# Patient Record
Sex: Male | Born: 1960 | Race: White | Hispanic: No | Smoking: Current every day smoker
Health system: Southern US, Community
[De-identification: ages and names within clinical notes are randomized; demographics above are authoritative.]

## PROBLEM LIST (undated history)

## (undated) DIAGNOSIS — Z87442 Personal history of urinary calculi: Secondary | ICD-10-CM

## (undated) DIAGNOSIS — K760 Fatty (change of) liver, not elsewhere classified: Secondary | ICD-10-CM

## (undated) DIAGNOSIS — M1990 Unspecified osteoarthritis, unspecified site: Secondary | ICD-10-CM

## (undated) DIAGNOSIS — S52502A Unspecified fracture of the lower end of left radius, initial encounter for closed fracture: Secondary | ICD-10-CM

## (undated) DIAGNOSIS — I10 Essential (primary) hypertension: Secondary | ICD-10-CM

## (undated) DIAGNOSIS — T7840XA Allergy, unspecified, initial encounter: Secondary | ICD-10-CM

## (undated) DIAGNOSIS — F1910 Other psychoactive substance abuse, uncomplicated: Secondary | ICD-10-CM

## (undated) HISTORY — DX: Allergy, unspecified, initial encounter: T78.40XA

## (undated) HISTORY — DX: Essential (primary) hypertension: I10

## (undated) HISTORY — PX: TONSILLECTOMY: SUR1361

## (undated) HISTORY — PX: NO PAST SURGERIES: SHX2092

---

## 2006-12-18 ENCOUNTER — Emergency Department (HOSPITAL_COMMUNITY): Admission: EM | Admit: 2006-12-18 | Discharge: 2006-12-18 | Payer: Self-pay | Admitting: Family Medicine

## 2008-02-23 ENCOUNTER — Emergency Department (HOSPITAL_COMMUNITY): Admission: EM | Admit: 2008-02-23 | Discharge: 2008-02-23 | Payer: Self-pay | Admitting: Emergency Medicine

## 2010-06-07 ENCOUNTER — Emergency Department (HOSPITAL_COMMUNITY)
Admission: EM | Admit: 2010-06-07 | Discharge: 2010-06-08 | Disposition: A | Discharge status: Other Acute Inpatient Hospital | Payer: Self-pay | Source: Home / Self Care | Admitting: Emergency Medicine

## 2010-06-08 ENCOUNTER — Inpatient Hospital Stay (HOSPITAL_COMMUNITY)
Admission: RE | Admit: 2010-06-08 | Discharge: 2010-06-11 | Payer: Self-pay | Source: Home / Self Care | Attending: Psychiatry | Admitting: Psychiatry

## 2010-06-18 ENCOUNTER — Ambulatory Visit (HOSPITAL_COMMUNITY): Admit: 2010-06-18 | Payer: Self-pay | Admitting: Psychiatry

## 2010-08-23 LAB — RAPID URINE DRUG SCREEN, HOSP PERFORMED
Barbiturates: NOT DETECTED
Benzodiazepines: NOT DETECTED
Cocaine: NOT DETECTED

## 2010-08-23 LAB — DIFFERENTIAL
Basophils Absolute: 0 10*3/uL (ref 0.0–0.1)
Basophils Relative: 0 % (ref 0–1)
Eosinophils Relative: 3 % (ref 0–5)
Monocytes Absolute: 0.5 10*3/uL (ref 0.1–1.0)

## 2010-08-23 LAB — BASIC METABOLIC PANEL
BUN: 7 mg/dL (ref 6–23)
CO2: 23 mEq/L (ref 19–32)
Chloride: 107 mEq/L (ref 96–112)
Glucose, Bld: 149 mg/dL — ABNORMAL HIGH (ref 70–99)
Potassium: 3.5 mEq/L (ref 3.5–5.1)

## 2010-08-23 LAB — CBC
HCT: 43.9 % (ref 39.0–52.0)
MCH: 34.8 pg — ABNORMAL HIGH (ref 26.0–34.0)
MCHC: 34.9 g/dL (ref 30.0–36.0)
MCV: 99.8 fL (ref 78.0–100.0)
RDW: 12.1 % (ref 11.5–15.5)

## 2010-08-23 LAB — HEPATIC FUNCTION PANEL
Alkaline Phosphatase: 43 U/L (ref 39–117)
Bilirubin, Direct: 0.2 mg/dL (ref 0.0–0.3)
Total Bilirubin: 0.7 mg/dL (ref 0.3–1.2)

## 2010-08-23 LAB — TRICYCLICS SCREEN, URINE: TCA Scrn: NOT DETECTED

## 2011-06-16 ENCOUNTER — Encounter: Payer: Self-pay | Admitting: Physician Assistant

## 2011-06-16 ENCOUNTER — Ambulatory Visit (INDEPENDENT_AMBULATORY_CARE_PROVIDER_SITE_OTHER): Payer: PRIVATE HEALTH INSURANCE | Admitting: Physician Assistant

## 2011-06-16 DIAGNOSIS — I10 Essential (primary) hypertension: Secondary | ICD-10-CM | POA: Insufficient documentation

## 2011-06-16 DIAGNOSIS — M79609 Pain in unspecified limb: Secondary | ICD-10-CM

## 2011-06-16 DIAGNOSIS — R350 Frequency of micturition: Secondary | ICD-10-CM

## 2011-12-22 ENCOUNTER — Encounter: Payer: PRIVATE HEALTH INSURANCE | Admitting: Physician Assistant

## 2012-02-07 ENCOUNTER — Other Ambulatory Visit: Payer: Self-pay | Admitting: Physician Assistant

## 2015-01-22 ENCOUNTER — Ambulatory Visit (INDEPENDENT_AMBULATORY_CARE_PROVIDER_SITE_OTHER): Payer: PRIVATE HEALTH INSURANCE

## 2015-01-22 ENCOUNTER — Ambulatory Visit (INDEPENDENT_AMBULATORY_CARE_PROVIDER_SITE_OTHER): Payer: PRIVATE HEALTH INSURANCE | Admitting: Family Medicine

## 2015-01-22 VITALS — BP 182/108 | HR 66 | Temp 98.3°F | Resp 16 | Ht 71.0 in | Wt 171.6 lb

## 2015-01-22 DIAGNOSIS — I10 Essential (primary) hypertension: Secondary | ICD-10-CM | POA: Diagnosis not present

## 2015-01-22 DIAGNOSIS — S5292XA Unspecified fracture of left forearm, initial encounter for closed fracture: Secondary | ICD-10-CM

## 2015-01-22 DIAGNOSIS — W19XXXA Unspecified fall, initial encounter: Secondary | ICD-10-CM

## 2015-01-22 DIAGNOSIS — M25532 Pain in left wrist: Secondary | ICD-10-CM

## 2015-01-22 DIAGNOSIS — S5290XA Unspecified fracture of unspecified forearm, initial encounter for closed fracture: Secondary | ICD-10-CM | POA: Insufficient documentation

## 2015-01-22 LAB — COMPREHENSIVE METABOLIC PANEL
ALBUMIN: 4.5 g/dL (ref 3.6–5.1)
ALT: 50 U/L — ABNORMAL HIGH (ref 9–46)
AST: 49 U/L — ABNORMAL HIGH (ref 10–35)
Alkaline Phosphatase: 58 U/L (ref 40–115)
BUN: 16 mg/dL (ref 7–25)
CALCIUM: 9.2 mg/dL (ref 8.6–10.3)
CHLORIDE: 102 mmol/L (ref 98–110)
CO2: 26 mmol/L (ref 20–31)
Creat: 0.85 mg/dL (ref 0.70–1.33)
Glucose, Bld: 87 mg/dL (ref 65–99)
POTASSIUM: 4.2 mmol/L (ref 3.5–5.3)
SODIUM: 138 mmol/L (ref 135–146)
Total Bilirubin: 1.1 mg/dL (ref 0.2–1.2)
Total Protein: 7.9 g/dL (ref 6.1–8.1)

## 2015-01-22 MED ORDER — OXYCODONE-ACETAMINOPHEN 5-325 MG PO TABS: 1.0000 | ORAL_TABLET | Freq: Three times a day (TID) | ORAL | Status: DC | PRN

## 2015-01-22 MED ORDER — LISINOPRIL-HYDROCHLOROTHIAZIDE 10-12.5 MG PO TABS: 1.0000 | ORAL_TABLET | Freq: Every day | ORAL | Status: DC

## 2015-01-22 NOTE — Progress Notes (Signed)
    MRN: [id number] DOB: Jan 04, 1961  Subjective:   Richard Andrews is a 54 y.o. male presenting for chief complaint of Wrist Injury and Hypertension  Reports 4 day history of left forearm and wrist pain. Patient was playing football with his son on Sunday, fell backwards and broke his fall with an outstretched left hand onto pavement. He has since had significant pain of his forearm and wrist, swelling, bruising, decreased range of motion secondary to pain. Has tried ibuprofen with minimal relief. Denies fever, numbness, tingling, finger pain, elbow pain, bony deformity. Patient smokes half-pack per day, has a 10 year smoking history. Patient also drinks more than 15 beers a week, admits that he is a heavy drinker. He has a history of hypertension that was previously managed with lisinopril. Patient denies chest pain, shortness of breath, heart racing, palpitations, jaw pain, neck pain, back pain, abdominal pain, nausea, vomiting, diaphoresis. Patient stopped taking blood pressure medication and stopped following up. Denies any other aggravating or relieving factors, no other questions or concerns.  Richard Andrews currently has no medications in their medication list. Also has No Known Allergies.  Richard Andrews  has a past medical history of Hypertension and Allergy. Also  has no past surgical history on file.  Objective:   Vitals: BP 182/108 mmHg  Pulse 66  Temp(Src) 98.3 F (36.8 C) (Oral)  Resp 16  Ht  (1.803 m)  Wt 171 lb 9.6 oz (77.837 kg)  BMI 23.94 kg/m2  SpO2 99%  Physical Exam  Constitutional: He is oriented to person, place, and time. He appears well-developed and well-nourished.  HENT:  Mouth/Throat: Oropharynx is clear and moist.  Eyes: Pupils are equal, round, and reactive to light. No scleral icterus.  Cardiovascular: Normal rate, regular rhythm and intact distal pulses.  Exam reveals no gallop and no friction rub.   No murmur heard. Pulmonary/Chest: No respiratory distress. He has  no wheezes. He has no rales.  Abdominal: Soft. Bowel sounds are normal. He exhibits no distension and no mass. There is no tenderness.  Musculoskeletal:       Left wrist: He exhibits decreased range of motion, tenderness, bony tenderness and swelling (over area depicted with associated ecchymosis). He exhibits no effusion, no crepitus and no deformity.       Arms: No pain over olecranon process, phalanges or metacarpals.   Neurological: He is alert and oriented to person, place, and time.  Skin: Skin is warm and dry. No rash noted. No erythema. No pallor.   ECG interpretation by Dr. Clelia Croft and PA-Giavanni Odonovan - normal sinus rhythm.  [facility] reading (PRIMARY) by  Dr. Clelia Croft and PA-Javionna Leder. Left forearm - impacted fracture of distal radius Left wrist - possible scaphoid fracture over radial aspect, please comment  Assessment and Plan :   1. Forearm fracture, left, closed, initial encounter 2. Fall, initial encounter 3. Pain in joint, forearm, left - Stabilized with sugar tong splint of left forearm. Urgent referral to ortho, Percocet for pain.  4. Essential hypertension - Asymptomatic, will restart BP medication lis-HCT, start baby aspirin daily. Cmet pending. Recheck in 4 weeks.  Wallis Bamberg, PA-C [facility] [phone] 01/22/2015 9:26 AM

## 2015-01-22 NOTE — Patient Instructions (Signed)
Forearm Fracture Your caregiver has diagnosed you as having a broken bone (fracture) of the forearm. This is the part of your arm between the elbow and your wrist. Your forearm is made up of two bones. These are the radius and ulna. A fracture is a break in one or both bones. A cast or splint is used to protect and keep your injured bone from moving. The cast or splint will be on generally for about 5 to 6 weeks, with individual variations. HOME CARE INSTRUCTIONS   Keep the injured part elevated while sitting or lying down. Keeping the injury above the level of your heart (the center of the chest). This will decrease swelling and pain.  Apply ice to the injury for 15-20 minutes, 03-04 times per day while awake, for 2 days. Put the ice in a plastic bag and place a thin towel between the bag of ice and your cast or splint.  If you have a plaster or fiberglass cast:  Do not try to scratch the skin under the cast using sharp or pointed objects.  Check the skin around the cast every day. You may put lotion on any red or sore areas.  Keep your cast dry and clean.  If you have a plaster splint:  Wear the splint as directed.  You may loosen the elastic around the splint if your fingers become numb, tingle, or turn cold or blue.  Do not put pressure on any part of your cast or splint. It may break. Rest your cast only on a pillow the first 24 hours until it is fully hardened.  Your cast or splint can be protected during bathing with a plastic bag. Do not lower the cast or splint into water.  Only take over-the-counter or prescription medicines for pain, discomfort, or fever as directed by your caregiver. SEEK IMMEDIATE MEDICAL CARE IF:   Your cast gets damaged or breaks.  You have more severe pain or swelling than you did before the cast.  Your skin or nails below the injury turn blue or gray, or feel cold or numb.  There is a bad smell or new stains and/or pus like (purulent) drainage  coming from under the cast. MAKE SURE YOU:   Understand these instructions.  Will watch your condition.  Will get help right away if you are not doing well or get worse. Document Released: 05/27/2000 Document Revised: 08/22/2011 Document Reviewed: 01/17/2008 [facility] Patient Information 2015 [place], [place]. This information is not intended to replace advice given to you by your health care provider. Make sure you discuss any questions you have with your health care provider.   Start taking 1 baby aspirin daily.  Hypertension Hypertension, commonly called high blood pressure, is when the force of blood pumping through your arteries is too strong. Your arteries are the blood vessels that carry blood from your heart throughout your body. A blood pressure reading consists of a higher number over a lower number, such as 110/72. The higher number (systolic) is the pressure inside your arteries when your heart pumps. The lower number (diastolic) is the pressure inside your arteries when your heart relaxes. Ideally you want your blood pressure below 120/80. Hypertension forces your heart to work harder to pump blood. Your arteries may become narrow or stiff. Having hypertension puts you at risk for heart disease, stroke, and other problems.  RISK FACTORS Some risk factors for high blood pressure are controllable. Others are not.  Risk factors you cannot control include:  Race. You may be at higher risk if you are African American.  Age. Risk increases with age.  Gender. Men are at higher risk than women before age 79 years. After age 60, women are at higher risk than men. Risk factors you can control include:  Not getting enough exercise or physical activity.  Being overweight.  Getting too much fat, sugar, calories, or salt in your diet.  Drinking too much alcohol. SIGNS AND SYMPTOMS Hypertension does not usually cause signs or symptoms. Extremely high blood pressure (hypertensive  crisis) may cause headache, anxiety, shortness of breath, and nosebleed. DIAGNOSIS  To check if you have hypertension, your health care provider will measure your blood pressure while you are seated, with your arm held at the level of your heart. It should be measured at least twice using the same arm. Certain conditions can cause a difference in blood pressure between your right and left arms. A blood pressure reading that is higher than normal on one occasion does not mean that you need treatment. If one blood pressure reading is high, ask your health care provider about having it checked again. TREATMENT  Treating high blood pressure includes making lifestyle changes and possibly taking medicine. Living a healthy lifestyle can help lower high blood pressure. You may need to change some of your habits. Lifestyle changes may include:  Following the DASH diet. This diet is high in fruits, vegetables, and whole grains. It is low in salt, red meat, and added sugars.  Getting at least 2 hours of brisk physical activity every week.  Losing weight if necessary.  Not smoking.  Limiting alcoholic beverages.  Learning ways to reduce stress. If lifestyle changes are not enough to get your blood pressure under control, your health care provider may prescribe medicine. You may need to take more than one. Work closely with your health care provider to understand the risks and benefits. HOME CARE INSTRUCTIONS  Have your blood pressure rechecked as directed by your health care provider.   Take medicines only as directed by your health care provider. Follow the directions carefully. Blood pressure medicines must be taken as prescribed. The medicine does not work as well when you skip doses. Skipping doses also puts you at risk for problems.   Do not smoke.   Monitor your blood pressure at home as directed by your health care provider. SEEK MEDICAL CARE IF:   You think you are having a reaction  to medicines taken.  You have recurrent headaches or feel dizzy.  You have swelling in your ankles.  You have trouble with your vision. SEEK IMMEDIATE MEDICAL CARE IF:  You develop a severe headache or confusion.  You have unusual weakness, numbness, or feel faint.  You have severe chest or abdominal pain.  You vomit repeatedly.  You have trouble breathing. MAKE SURE YOU:   Understand these instructions.  Will watch your condition.  Will get help right away if you are not doing well or get worse. Document Released: 05/30/2005 Document Revised: 10/14/2013 Document Reviewed: 03/22/2013 [facility] Patient Information 2015 [place], [place]. This information is not intended to replace advice given to you by your health care provider. Make sure you discuss any questions you have with your health care provider.

## 2015-01-26 ENCOUNTER — Other Ambulatory Visit (HOSPITAL_COMMUNITY): Payer: Self-pay | Admitting: Orthopaedic Surgery

## 2015-01-26 ENCOUNTER — Encounter (HOSPITAL_BASED_OUTPATIENT_CLINIC_OR_DEPARTMENT_OTHER): Payer: Self-pay | Admitting: *Deleted

## 2015-01-28 ENCOUNTER — Encounter (HOSPITAL_BASED_OUTPATIENT_CLINIC_OR_DEPARTMENT_OTHER): Payer: Self-pay | Admitting: *Deleted

## 2015-01-28 ENCOUNTER — Encounter (HOSPITAL_BASED_OUTPATIENT_CLINIC_OR_DEPARTMENT_OTHER)
Admission: RE | Disposition: A | Discharge status: Home / Self Care | Payer: Self-pay | Source: Ambulatory Visit | Attending: Orthopaedic Surgery

## 2015-01-28 ENCOUNTER — Ambulatory Visit (HOSPITAL_COMMUNITY): Payer: PRIVATE HEALTH INSURANCE

## 2015-01-28 ENCOUNTER — Ambulatory Visit (HOSPITAL_BASED_OUTPATIENT_CLINIC_OR_DEPARTMENT_OTHER): Payer: PRIVATE HEALTH INSURANCE | Admitting: Anesthesiology

## 2015-01-28 ENCOUNTER — Ambulatory Visit (HOSPITAL_BASED_OUTPATIENT_CLINIC_OR_DEPARTMENT_OTHER)
Admission: RE | Admit: 2015-01-28 | Discharge: 2015-01-28 | Disposition: A | Discharge status: Home / Self Care | Payer: PRIVATE HEALTH INSURANCE | Source: Ambulatory Visit | Attending: Orthopaedic Surgery | Admitting: Orthopaedic Surgery

## 2015-01-28 DIAGNOSIS — Z79899 Other long term (current) drug therapy: Secondary | ICD-10-CM | POA: Diagnosis not present

## 2015-01-28 DIAGNOSIS — X58XXXA Exposure to other specified factors, initial encounter: Secondary | ICD-10-CM | POA: Insufficient documentation

## 2015-01-28 DIAGNOSIS — S52572A Other intraarticular fracture of lower end of left radius, initial encounter for closed fracture: Secondary | ICD-10-CM | POA: Insufficient documentation

## 2015-01-28 DIAGNOSIS — S52502A Unspecified fracture of the lower end of left radius, initial encounter for closed fracture: Secondary | ICD-10-CM | POA: Diagnosis present

## 2015-01-28 DIAGNOSIS — I10 Essential (primary) hypertension: Secondary | ICD-10-CM | POA: Insufficient documentation

## 2015-01-28 DIAGNOSIS — F17210 Nicotine dependence, cigarettes, uncomplicated: Secondary | ICD-10-CM | POA: Diagnosis not present

## 2015-01-28 DIAGNOSIS — Z419 Encounter for procedure for purposes other than remedying health state, unspecified: Secondary | ICD-10-CM

## 2015-01-28 HISTORY — PX: OPEN REDUCTION INTERNAL FIXATION (ORIF) DISTAL RADIAL FRACTURE: SHX5989

## 2015-01-28 HISTORY — DX: Unspecified fracture of the lower end of left radius, initial encounter for closed fracture: S52.502A

## 2015-01-28 LAB — POCT HEMOGLOBIN-HEMACUE: HEMOGLOBIN: 15 g/dL (ref 13.0–17.0)

## 2015-01-28 SURGERY — OPEN REDUCTION INTERNAL FIXATION (ORIF) DISTAL RADIUS FRACTURE
Anesthesia: Regional | Site: Arm Lower | Laterality: Left

## 2015-01-28 MED ORDER — FENTANYL CITRATE (PF) 100 MCG/2ML IJ SOLN
INTRAMUSCULAR | Status: AC
  Filled 2015-01-28: qty 2

## 2015-01-28 MED ORDER — PROPOFOL 10 MG/ML IV BOLUS
INTRAVENOUS | Status: DC | PRN
  Administered 2015-01-28: 200 mg via INTRAVENOUS
  Administered 2015-01-28: 50 mg via INTRAVENOUS

## 2015-01-28 MED ORDER — BUPIVACAINE-EPINEPHRINE (PF) 0.5% -1:200000 IJ SOLN
INTRAMUSCULAR | Status: DC | PRN
  Administered 2015-01-28: 5 mL
  Administered 2015-01-28: 3 mL
  Administered 2015-01-28: 7 mL
  Administered 2015-01-28 (×3): 5 mL

## 2015-01-28 MED ORDER — MIDAZOLAM HCL 2 MG/2ML IJ SOLN
INTRAMUSCULAR | Status: AC
  Filled 2015-01-28: qty 2

## 2015-01-28 MED ORDER — SCOPOLAMINE 1 MG/3DAYS TD PT72: 1.0000 | MEDICATED_PATCH | Freq: Once | TRANSDERMAL | Status: DC | PRN

## 2015-01-28 MED ORDER — OXYCODONE HCL 5 MG/5ML PO SOLN: 5.0000 mg | Freq: Once | ORAL | Status: DC | PRN

## 2015-01-28 MED ORDER — OXYCODONE HCL 5 MG PO TABS: 5.0000 mg | ORAL_TABLET | Freq: Once | ORAL | Status: DC | PRN

## 2015-01-28 MED ORDER — ONDANSETRON HCL 4 MG/2ML IJ SOLN
INTRAMUSCULAR | Status: DC | PRN
Start: 2015-01-28 — End: 2015-01-28
  Administered 2015-01-28: 4 mg via INTRAVENOUS

## 2015-01-28 MED ORDER — PROMETHAZINE HCL 25 MG/ML IJ SOLN: 6.2500 mg | INTRAMUSCULAR | Status: DC | PRN

## 2015-01-28 MED ORDER — LACTATED RINGERS IV SOLN
INTRAVENOUS | Status: DC
  Administered 2015-01-28 (×2): via INTRAVENOUS

## 2015-01-28 MED ORDER — KETOROLAC TROMETHAMINE 30 MG/ML IJ SOLN: 30.0000 mg | Freq: Once | INTRAMUSCULAR | Status: DC

## 2015-01-28 MED ORDER — OXYCODONE-ACETAMINOPHEN 5-325 MG PO TABS: 1.0000 | ORAL_TABLET | ORAL | Status: DC | PRN

## 2015-01-28 MED ORDER — CEFAZOLIN SODIUM-DEXTROSE 2-3 GM-% IV SOLR
2.0000 g | INTRAVENOUS | Status: AC
  Administered 2015-01-28: 2 g via INTRAVENOUS

## 2015-01-28 MED ORDER — FENTANYL CITRATE (PF) 100 MCG/2ML IJ SOLN
INTRAMUSCULAR | Status: AC
  Filled 2015-01-28: qty 4

## 2015-01-28 MED ORDER — GLYCOPYRROLATE 0.2 MG/ML IJ SOLN
0.2000 mg | Freq: Once | INTRAMUSCULAR | Status: AC | PRN
  Administered 2015-01-28: 0.2 mg via INTRAVENOUS

## 2015-01-28 MED ORDER — MIDAZOLAM HCL 2 MG/2ML IJ SOLN
1.0000 mg | INTRAMUSCULAR | Status: DC | PRN
  Administered 2015-01-28: 2 mg via INTRAVENOUS

## 2015-01-28 MED ORDER — HYDROMORPHONE HCL 1 MG/ML IJ SOLN: 0.2500 mg | INTRAMUSCULAR | Status: DC | PRN

## 2015-01-28 MED ORDER — DEXAMETHASONE SODIUM PHOSPHATE 10 MG/ML IJ SOLN
INTRAMUSCULAR | Status: DC | PRN
  Administered 2015-01-28: 10 mg via INTRAVENOUS

## 2015-01-28 MED ORDER — FENTANYL CITRATE (PF) 100 MCG/2ML IJ SOLN
50.0000 ug | INTRAMUSCULAR | Status: DC | PRN
  Administered 2015-01-28: 100 ug via INTRAVENOUS
  Administered 2015-01-28: 50 ug via INTRAVENOUS

## 2015-01-28 SURGICAL SUPPLY — 95 items
BANDAGE ELASTIC 3 VELCRO ST LF (GAUZE/BANDAGES/DRESSINGS) ×3 IMPLANT
BANDAGE ELASTIC 4 VELCRO ST LF (GAUZE/BANDAGES/DRESSINGS) IMPLANT
BIT DRILL 2.2 SS TIBIAL (BIT) ×2 IMPLANT
BLADE MINI RND TIP GREEN BEAV (BLADE) IMPLANT
BLADE SURG 10 STRL SS (BLADE) ×3 IMPLANT
BLADE SURG 15 STRL LF DISP TIS (BLADE) ×1 IMPLANT
BLADE SURG 15 STRL SS (BLADE) ×3
BNDG CMPR 9X4 STRL LF SNTH (GAUZE/BANDAGES/DRESSINGS) ×1
BNDG ESMARK 4X9 LF (GAUZE/BANDAGES/DRESSINGS) ×3 IMPLANT
BNDG GAUZE ELAST 4 BULKY (GAUZE/BANDAGES/DRESSINGS) ×3 IMPLANT
BRUSH SCRUB EZ PLAIN DRY (MISCELLANEOUS) ×3 IMPLANT
BUR EGG 3PK/BX (BURR) IMPLANT
CLOSURE WOUND 1/4X4 (GAUZE/BANDAGES/DRESSINGS)
CORDS BIPOLAR (ELECTRODE) ×3 IMPLANT
COVER BACK TABLE 60X90IN (DRAPES) ×3 IMPLANT
COVER MAYO STAND STRL (DRAPES) ×3 IMPLANT
CUFF TOURNIQUET SINGLE 18IN (TOURNIQUET CUFF) ×2 IMPLANT
DECANTER SPIKE VIAL GLASS SM (MISCELLANEOUS) IMPLANT
DRAPE C-ARM 42X72 X-RAY (DRAPES) ×3 IMPLANT
DRAPE EXTREMITY T 121X128X90 (DRAPE) ×3 IMPLANT
DRAPE INCISE IOBAN 66X45 STRL (DRAPES) IMPLANT
DRAPE SURG 17X23 STRL (DRAPES) ×3 IMPLANT
DURAPREP 26ML APPLICATOR (WOUND CARE) ×3 IMPLANT
GAUZE SPONGE 4X4 12PLY STRL (GAUZE/BANDAGES/DRESSINGS) ×3 IMPLANT
GAUZE SPONGE 4X4 16PLY XRAY LF (GAUZE/BANDAGES/DRESSINGS) IMPLANT
GAUZE XEROFORM 1X8 LF (GAUZE/BANDAGES/DRESSINGS) ×3 IMPLANT
GLOVE BIOGEL PI IND STRL 7.0 (GLOVE) IMPLANT
GLOVE BIOGEL PI INDICATOR 7.0 (GLOVE) ×4
GLOVE ECLIPSE 6.5 STRL STRAW (GLOVE) ×2 IMPLANT
GLOVE NEODERM STRL 7.5 LF PF (GLOVE) ×1 IMPLANT
GLOVE SURG NEODERM 7.5  LF PF (GLOVE) ×2
GLOVE SURG SYN 7.5  E (GLOVE) ×2
GLOVE SURG SYN 7.5 E (GLOVE) ×1 IMPLANT
GLOVE SURG SYN 7.5 PF PI (GLOVE) ×1 IMPLANT
GOWN PREVENTION PLUS XLARGE (GOWN DISPOSABLE) ×1 IMPLANT
GOWN STRL REIN XL XLG (GOWN DISPOSABLE) ×3 IMPLANT
K-WIRE 1.6 (WIRE) ×9
K-WIRE FX5X1.6XNS BN SS (WIRE) ×3
KWIRE FX5X1.6XNS BN SS (WIRE) IMPLANT
NEEDLE HYPO 22GX1.5 SAFETY (NEEDLE) IMPLANT
NS IRRIG 1000ML POUR BTL (IV SOLUTION) ×7 IMPLANT
PACK BASIN DAY SURGERY FS (CUSTOM PROCEDURE TRAY) ×3 IMPLANT
PAD CAST 3X4 CTTN HI CHSV (CAST SUPPLIES) ×1 IMPLANT
PADDING CAST ABS 3INX4YD NS (CAST SUPPLIES)
PADDING CAST ABS COTTON 3X4 (CAST SUPPLIES) IMPLANT
PADDING CAST COTTON 3X4 STRL (CAST SUPPLIES) ×3
PADDING CAST SYNTHETIC 3 NS LF (CAST SUPPLIES)
PADDING CAST SYNTHETIC 3X4 NS (CAST SUPPLIES) IMPLANT
PADDING CAST SYNTHETIC 4 (CAST SUPPLIES) ×2
PADDING CAST SYNTHETIC 4X4 STR (CAST SUPPLIES) IMPLANT
PLATE STANDARD DVR LEFT (Plate) ×3 IMPLANT
PLATE STD DVR LT 24X51 (Plate) IMPLANT
SCREW LOCK 14X2.7X 3 LD TPR (Screw) IMPLANT
SCREW LOCK 16X2.7X 3 LD TPR (Screw) IMPLANT
SCREW LOCK 20X2.7X 3 LD TPR (Screw) IMPLANT
SCREW LOCK 22X2.7X 3 LD TPR (Screw) IMPLANT
SCREW LOCK 24X2.7X3 LD THRD (Screw) IMPLANT
SCREW LOCKING 2.7X14 (Screw) ×6 IMPLANT
SCREW LOCKING 2.7X16 (Screw) ×6 IMPLANT
SCREW LOCKING 2.7X20MM (Screw) ×6 IMPLANT
SCREW LOCKING 2.7X22MM (Screw) ×6 IMPLANT
SCREW LOCKING 2.7X24MM (Screw) ×3 IMPLANT
SCREW NLOCK 26X2.7X3 LD THRD (Screw) IMPLANT
SCREW NONLOCK 2.7X26MM (Screw) ×3 IMPLANT
SLEEVE SCD COMPRESS KNEE MED (MISCELLANEOUS) ×3 IMPLANT
SLING ARM FOAM STRAP LRG (SOFTGOODS) ×2 IMPLANT
SPLINT FIBERGLASS 3X35 (CAST SUPPLIES) IMPLANT
SPLINT PLASTER CAST XFAST 3X15 (CAST SUPPLIES) IMPLANT
SPLINT PLASTER XTRA FASTSET 3X (CAST SUPPLIES)
SPONGE LAP 4X18 X RAY DECT (DISPOSABLE) IMPLANT
STAPLER VISISTAT (STAPLE) IMPLANT
STOCKINETTE 4X48 STRL (DRAPES) ×3 IMPLANT
STRIP CLOSURE SKIN 1/4X4 (GAUZE/BANDAGES/DRESSINGS) IMPLANT
SUCTION FRAZIER TIP 10 FR DISP (SUCTIONS) ×2 IMPLANT
SUT BONE WAX W31G (SUTURE) ×3 IMPLANT
SUT ETHILON 3 0 PS 1 (SUTURE) ×3 IMPLANT
SUT MERSILENE 4 0 P 3 (SUTURE) IMPLANT
SUT VIC AB 0 CT1 18XCR BRD 8 (SUTURE) IMPLANT
SUT VIC AB 0 CT1 8-18 (SUTURE)
SUT VIC AB 2-0 CT1 27 (SUTURE)
SUT VIC AB 2-0 CT1 TAPERPNT 27 (SUTURE) IMPLANT
SUT VIC AB 2-0 SH 27 (SUTURE)
SUT VIC AB 2-0 SH 27XBRD (SUTURE) IMPLANT
SUT VIC AB 4-0 P-3 18XBRD (SUTURE) IMPLANT
SUT VIC AB 4-0 P2 18 (SUTURE) IMPLANT
SUT VIC AB 4-0 P3 18 (SUTURE)
SYR BULB 3OZ (MISCELLANEOUS) ×3 IMPLANT
SYR CONTROL 10ML LL (SYRINGE) IMPLANT
TOWEL OR 17X24 6PK STRL BLUE (TOWEL DISPOSABLE) ×4 IMPLANT
TOWEL OR NON WOVEN STRL DISP B (DISPOSABLE) ×2 IMPLANT
TRAY DSU PREP LF (CUSTOM PROCEDURE TRAY) ×3 IMPLANT
TUBE CONNECTING 20'X1/4 (TUBING) ×1
TUBE CONNECTING 20X1/4 (TUBING) ×1 IMPLANT
UNDERPAD 30X30 (UNDERPADS AND DIAPERS) ×3 IMPLANT
YANKAUER SUCT BULB TIP NO VENT (SUCTIONS) IMPLANT

## 2015-01-28 NOTE — Progress Notes (Signed)
Assisted Dr. Rob Fitzgerald with left, ultrasound guided, axillary block. Side rails up, monitors on throughout procedure. See vital signs in flow sheet. Tolerated Procedure well. 

## 2015-01-28 NOTE — Anesthesia Postprocedure Evaluation (Signed)
  Anesthesia Post-op Note  Patient: Richard Andrews  Procedure(s) Performed: Procedure(s): OPEN REDUCTION INTERNAL FIXATION (ORIF) LEFT DISTAL RADIAL FRACTURE (Left)  Patient Location: PACU  Anesthesia Type:GA combined with regional for post-op pain  Level of Consciousness: awake and alert   Airway and Oxygen Therapy: Patient Spontanous Breathing  Post-op Pain: none  Post-op Assessment: Post-op Vital signs reviewed              Post-op Vital Signs: Reviewed  Last Vitals:  Filed Vitals:   01/28/15 1315  BP: 149/90  Pulse: 50  Temp: 36.2 C  Resp: 16    Complications: No apparent anesthesia complications

## 2015-01-28 NOTE — Anesthesia Preprocedure Evaluation (Addendum)
Anesthesia Evaluation  Patient identified by MRN, date of birth, ID band Patient awake    Reviewed: Allergy & Precautions, NPO status , Patient's Chart, lab work & pertinent test results  Airway Mallampati: III  TM Distance: >3 FB Neck ROM: Full  Mouth opening: Limited Mouth Opening  Dental  (+) Dental Advisory Given   Pulmonary Current Smoker,  breath sounds clear to auscultation        Cardiovascular hypertension, Pt. on medications Rhythm:Regular Rate:Normal     Neuro/Psych negative neurological ROS     GI/Hepatic negative GI ROS, Neg liver ROS,   Endo/Other  negative endocrine ROS  Renal/GU negative Renal ROS     Musculoskeletal negative musculoskeletal ROS (+)   Abdominal   Peds  Hematology negative hematology ROS (+)   Anesthesia Other Findings   Reproductive/Obstetrics                            Anesthesia Physical Anesthesia Plan  ASA: II  Anesthesia Plan: General and Regional   Post-op Pain Management: GA combined w/ Regional for post-op pain   Induction: Intravenous  Airway Management Planned: LMA  Additional Equipment:   Intra-op Plan:   Post-operative Plan:   Informed Consent: I have reviewed the patients History and Physical, chart, labs and discussed the procedure including the risks, benefits and alternatives for the proposed anesthesia with the patient or authorized representative who has indicated his/her understanding and acceptance.   Dental advisory given  Plan Discussed with: CRNA  Anesthesia Plan Comments:         Anesthesia Quick Evaluation

## 2015-01-28 NOTE — Discharge Instructions (Signed)
Postoperative instructions:  Weightbearing: Non weight bearing  Dressing instructions: Keep your dressing and/or splint clean and dry at all times.  It will be removed at your first post-operative appointment.  Your stitches and/or staples will be removed at this visit.  Incision instructions:  Do not soak your incision for 3 weeks after surgery.  If the incision gets wet, pat dry and do not scrub the incision.  Pain control:  You have been given a prescription to be taken as directed for post-operative pain control.  In addition, elevate the operative extremity above the heart at all times to prevent swelling and throbbing pain.  Take over-the-counter Colace, 100mg  by mouth twice a day while taking narcotic pain medications to help prevent constipation.  Follow up appointments: 1) 10-14 days for suture removal and wound check. 2) Dr. Roda Shutters as scheduled.   -------------------------------------------------------------------------------------------------------------  After Surgery Pain Control:  After your surgery, post-surgical discomfort or pain is likely. This discomfort can last several days to a few weeks. At certain times of the day your discomfort may be more intense.  Did you receive a nerve block?  A nerve block can provide pain relief for one hour to two days after your surgery. As long as the nerve block is working, you will experience little or no sensation in the area the surgeon operated on.  As the nerve block wears off, you will begin to experience pain or discomfort. It is very important that you begin taking your prescribed pain medication before the nerve block fully wears off. Treating your pain at the first sign of the block wearing off will ensure your pain is better controlled and more tolerable when full-sensation returns. Do not wait until the pain is intolerable, as the medicine will be less effective. It is better to treat pain in advance than to try and catch up.    General Anesthesia:  If you did not receive a nerve block during your surgery, you will need to start taking your pain medication shortly after your surgery and should continue to do so as prescribed by your surgeon.  Pain Medication:  Most commonly we prescribe Vicodin and Percocet for post-operative pain. Both of these medications contain a combination of acetaminophen (Tylenol) and a narcotic to help control pain.   It takes between 30 and 45 minutes before pain medication starts to work. It is important to take your medication before your pain level gets too intense.   Nausea is a common side effect of many pain medications. You will want to eat something before taking your pain medicine to help prevent nausea.   If you are taking a prescription pain medication that contains acetaminophen, we recommend that you do not take additional over the counter acetaminophen (Tylenol).  Other pain relieving options:   Using a cold pack to ice the affected area a few times a day (15 to 20 minutes at a time) can help to relieve pain, reduce swelling and bruising.   Elevation of the affected area can also help to reduce pain and swelling.      Regional Anesthesia Blocks  1. Numbness or the inability to move the "blocked" extremity may last from 3-48 hours after placement. The length of time depends on the medication injected and your individual response to the medication. If the numbness is not going away after 48 hours, call your surgeon.  2. The extremity that is blocked will need to be protected until the numbness is gone and the  Strength has returned. Because you cannot feel it, you will need to take extra care to avoid injury. Because it may be weak, you may have difficulty moving it or using it. You may not know what position it is in without looking at it while the block is in effect.  3. For blocks in the legs and feet, returning to weight bearing and walking needs to be done carefully.  You will need to wait until the numbness is entirely gone and the strength has returned. You should be able to move your leg and foot normally before you try and bear weight or walk. You will need someone to be with you when you first try to ensure you do not fall and possibly risk injury.  4. Bruising and tenderness at the needle site are common side effects and will resolve in a few days.  5. Persistent numbness or new problems with movement should be communicated to the surgeon or the [facility] [phone] [facility] [phone]).    Post Anesthesia Home Care Instructions  Activity: Get plenty of rest for the remainder of the day. A responsible adult should stay with you for 24 hours following the procedure.  For the next 24 hours, DO NOT: -Drive a car -Advertising copywriter -Drink alcoholic beverages -Take any medication unless instructed by your physician -Make any legal decisions or sign important papers.  Meals: Start with liquid foods such as gelatin or soup. Progress to regular foods as tolerated. Avoid greasy, spicy, heavy foods. If nausea and/or vomiting occur, drink only clear liquids until the nausea and/or vomiting subsides. Call your physician if vomiting continues.  Special Instructions/Symptoms: Your throat may feel dry or sore from the anesthesia or the breathing tube placed in your throat during surgery. If this causes discomfort, gargle with warm salt water. The discomfort should disappear within 24 hours.  If you had a scopolamine patch placed behind your ear for the management of post- operative nausea and/or vomiting:  1. The medication in the patch is effective for 72 hours, after which it should be removed.  Wrap patch in a tissue and discard in the trash. Wash hands thoroughly with soap and water. 2. You may remove the patch earlier than 72 hours if you experience unpleasant side effects which may include dry mouth, dizziness or  visual disturbances. 3. Avoid touching the patch. Wash your hands with soap and water after contact with the patch.

## 2015-01-28 NOTE — Anesthesia Procedure Notes (Addendum)
Anesthesia Regional Block:  Axillary brachial plexus block  Pre-Anesthetic Checklist: ,, timeout performed, Correct Patient, Correct Site, Correct Laterality, Correct Procedure, Correct Position, site marked, Risks and benefits discussed,  Surgical consent,  Pre-op evaluation,  At surgeon's request and post-op pain management  Laterality: Left  Prep: chloraprep       Needles:   Needle Type: Echogenic Stimulator Needle     Needle Length: 9cm 9 cm Needle Gauge: 21 and 21 G    Additional Needles:  Procedures: ultrasound guided (picture in chart) and nerve stimulator Axillary brachial plexus block  Nerve Stimulator or Paresthesia:  Response: biceps, 0.5 mA,  Response: median, 0.5 mA,  Response: radial, 0.5 mA,   Additional Responses:   Narrative:  Start time: 01/28/2015 9:50 AM End time: 01/28/2015 10:00 AM Injection made incrementally with aspirations every 5 mL.  Events: blood aspirated  Performed by: Personally  Anesthesiologist: Marcene Duos  Additional Notes: Risks and benefits discussed. Pt tolerated well with no immediate complications.   Procedure Name: LMA Insertion Date/Time: 01/28/2015 10:44 AM Performed by: Caren Macadam Pre-anesthesia Checklist: Patient identified, Emergency Drugs available, Suction available and Patient being monitored Patient Re-evaluated:Patient Re-evaluated prior to inductionOxygen Delivery Method: Circle System Utilized Preoxygenation: Pre-oxygenation with 100% oxygen Intubation Type: IV induction Ventilation: Mask ventilation without difficulty LMA: LMA inserted LMA Size: 4.0 Number of attempts: 1 Airway Equipment and Method: Bite block Placement Confirmation: positive ETCO2 and breath sounds checked- equal and bilateral Tube secured with: Tape Dental Injury: Teeth and Oropharynx as per pre-operative assessment

## 2015-01-28 NOTE — Op Note (Signed)
   DATE OF SURGERY: 01/28/2015  PREOPERATIVE DIAGNOSIS: LEFT DISTAL RADIUS FRACTURE  POSTOPERATIVE DIAGNOSIS: same  PROCEDURE: Open reduction and internal fixation, Left  distal radius. CPT [phone] 3 or more fragments  IMPLANT:  Implant Name Type Inv. Item Serial No. Manufacturer Lot No. LRB No. Used  LOG [id number] - [employer] BIOMET CROSSLOCK DVR SET - 1      Left   PLATE STANDARD DVR LEFT - [id number] Plate PLATE STANDARD DVR LEFT  BIOMET TRAUMA DP STERILIZED ON SET Left 1  SCREW LOCKING 2.7X16 - [id number] Screw SCREW LOCKING 2.7X16  BIOMET TRAUMA DP STERILIZED ON SET Left 2  SCREW LOCKING 2.7X22MM - [id number] Screw SCREW LOCKING 2.7X22MM  BIOMET TRAUMA DP STERILIZED ON SET Left 2  SCREW LOCKING 2.7X24MM - [id number] Screw SCREW LOCKING 2.7X24MM  BIOMET TRAUMA DP STERILIZED ON SET Left 1  SCREW LOCKING 2.7X14 - [id number] Screw SCREW LOCKING 2.7X14  BIOMET TRAUMA DP STERILZIED ON SET Left 2  SCREW LOCKING 2.7X20MM - [id number] Screw SCREW LOCKING 2.7X20MM  BIOMET TRAUMA DP STERILIZED ON SET Left 2  SCREW NONLOCK 2.7X26MM - [id number] Screw SCREW NONLOCK 2.7X26MM   BIOMET TRAUMA DP STERILIZED ON SSET Left 1     SURGEON: Surgeon(s) and Role:    * Naiping Donnelly Stager, MD - Primary  ANESTHESIA: General  TOURNIQUET TIME:  Total Tourniquet Time Documented: Upper Arm (Left) - 36 minutes Total: Upper Arm (Left) - 36 minutes   BLOOD LOSS: Minimal.  COMPLICATIONS: None.  PATHOLOGY: None.  TIME OUT: Performed before the start of procedure.  INDICATIONS: The patient is a 54 y.o. male who presented with a displaced intraarticular left distal radius fracture indicated for osteosynthesis.  DESCRIPTION OF PROCEDURE:  The patient was identified in the preoperative holding area.  The operative site was marked by the surgeon and confirmed by the patient.  He was brought back to the operating room.  Anesthesia was induced by the anesthesia team.  A well padded nonsterile tourniquet was placed. The operative  extremity was prepped and draped in standard sterile fashion.  A timeout was performed.  Preoperative antibiotics were given. The extremity was exsanguinated, tourniquet inflated to 250 mmHg.  A FCR approach was used.  Dissection through the sheath of FCR was carried out and the FCR tendon was retracted. The floor of FCR sheath was released. Flexor tendons and median nerve were retracted ulnarly and protected. Pronator quadratus was released from distal radius. Fracture lines were visualized. Fracture was reduced under fluoroscopy. A volar locking plate was applied to the volar surface of the distal radius. 7 distal locking screws and 3 shaft  screws were inserted. Fluoroscopy was used to show adequate reduction. The tourniquet was deflated. Hemostasis was achieved. The wound was irrigated. Incision closed in layers. Sterile dressing applied. Wrist immobilized in a removable brace. The patient was transferred to the recovery room in stable condition after all counts were correct.  POSTOPERATIVE PLAN: The patient will remain in the brace until instructed. Sutures will be removed in two weeks. About four weeks after surgery, will start wrist range of motion exercises, but in the meantime before then she will start aggressive finger range of motion exercises without resistance.  The "six pack of hand exercises" handout was given to the patient.

## 2015-01-28 NOTE — Transfer of Care (Signed)
Immediate Anesthesia Transfer of Care Note  Patient: Richard Andrews  Procedure(s) Performed: Procedure(s): OPEN REDUCTION INTERNAL FIXATION (ORIF) LEFT DISTAL RADIAL FRACTURE (Left)  Patient Location: PACU  Anesthesia Type:General and GA combined with regional for post-op pain  Level of Consciousness: awake and alert   Airway & Oxygen Therapy: Patient Spontanous Breathing and Patient connected to face mask oxygen  Post-op Assessment: Report given to RN and Post -op Vital signs reviewed and stable  Post vital signs: Reviewed and stable  Last Vitals:  Filed Vitals:   01/28/15 0954  BP: 124/73  Pulse: 56  Temp:   Resp: 13    Complications: No apparent anesthesia complications

## 2015-01-28 NOTE — H&P (Signed)
    PREOPERATIVE H&P  Chief Complaint: LEFT DISTAL RADIUS FRACTURE  HPI: Richard Andrews is a 54 y.o. male who presents for surgical treatment of LEFT DISTAL RADIUS FRACTURE.  He denies any changes in medical history.  Past Medical History  Diagnosis Date  . Hypertension   . Allergy   . Distal radius fracture, left    Past Surgical History  Procedure Laterality Date  . Tonsillectomy     Social History   Social History  . Marital Status: Married    Spouse Name: Richard Andrews  . Number of Children: N/A  . Years of Education: N/A   Occupational History  . Emergency planning/management officer    Social History Main Topics  . Smoking status: Current Every Day Smoker -- 0.50 packs/day  . Smokeless tobacco: Current User    Types: Chew  . Alcohol Use: 9.0 oz/week    15 Cans of beer per week     Comment: social  . Drug Use: No  . Sexual Activity: Not Asked   Other Topics Concern  . None   Social History Narrative   Family History  Problem Relation Age of Onset  . Heart disease Mother   . Hyperlipidemia Mother    No Known Allergies Prior to Admission medications   Medication Sig Start Date End Date Taking? Authorizing Provider  lisinopril-hydrochlorothiazide (PRINZIDE,ZESTORETIC) 10-12.5 MG per tablet Take 1 tablet by mouth daily. 01/22/15  Yes Wallis Bamberg, PA-C  oxyCODONE-acetaminophen (ROXICET) 5-325 MG per tablet Take 1 tablet by mouth every 8 (eight) hours as needed for severe pain. 01/22/15  Yes Wallis Bamberg, PA-C     Positive ROS: All other systems have been reviewed and were otherwise negative with the exception of those mentioned in the HPI and as above.  Physical Exam: General: Alert, no acute distress Cardiovascular: No pedal edema Respiratory: No cyanosis, no use of accessory musculature GI: abdomen soft Skin: No lesions in the area of chief complaint Neurologic: Sensation intact distally Psychiatric: Patient is competent for consent with normal mood and affect Lymphatic: no  lymphedema  MUSCULOSKELETAL: exam stable  Assessment: LEFT DISTAL RADIUS FRACTURE  Plan: Plan for Procedure(s): OPEN REDUCTION INTERNAL FIXATION (ORIF) LEFT DISTAL RADIAL FRACTURE  The risks benefits and alternatives were discussed with the patient including but not limited to the risks of nonoperative treatment, versus surgical intervention including infection, bleeding, nerve injury,  blood clots, cardiopulmonary complications, morbidity, mortality, among others, and they were willing to proceed.   Cheral Almas, MD   01/28/2015 7:22 AM

## 2015-01-29 ENCOUNTER — Encounter (HOSPITAL_BASED_OUTPATIENT_CLINIC_OR_DEPARTMENT_OTHER): Payer: Self-pay | Admitting: Orthopaedic Surgery

## 2015-02-11 NOTE — Progress Notes (Signed)
Patient ID: Richard Andrews, male   DOB: 1961-04-02, 54 y.o.   MRN: [id number] Reviewed documentation, EKG, and xray and agree w/ assessment and plan. Norberto Sorenson, MD MPH

## 2015-12-03 ENCOUNTER — Emergency Department (HOSPITAL_COMMUNITY): Payer: PRIVATE HEALTH INSURANCE

## 2015-12-03 ENCOUNTER — Encounter (HOSPITAL_COMMUNITY): Payer: Self-pay | Admitting: Emergency Medicine

## 2015-12-03 DIAGNOSIS — F10129 Alcohol abuse with intoxication, unspecified: Secondary | ICD-10-CM | POA: Diagnosis not present

## 2015-12-03 DIAGNOSIS — Y999 Unspecified external cause status: Secondary | ICD-10-CM | POA: Insufficient documentation

## 2015-12-03 DIAGNOSIS — S51011A Laceration without foreign body of right elbow, initial encounter: Secondary | ICD-10-CM | POA: Insufficient documentation

## 2015-12-03 DIAGNOSIS — Y939 Activity, unspecified: Secondary | ICD-10-CM | POA: Insufficient documentation

## 2015-12-03 DIAGNOSIS — W108XXA Fall (on) (from) other stairs and steps, initial encounter: Secondary | ICD-10-CM | POA: Insufficient documentation

## 2015-12-03 DIAGNOSIS — F17210 Nicotine dependence, cigarettes, uncomplicated: Secondary | ICD-10-CM | POA: Insufficient documentation

## 2015-12-03 DIAGNOSIS — Y92009 Unspecified place in unspecified non-institutional (private) residence as the place of occurrence of the external cause: Secondary | ICD-10-CM | POA: Diagnosis not present

## 2015-12-03 DIAGNOSIS — Z7982 Long term (current) use of aspirin: Secondary | ICD-10-CM | POA: Diagnosis not present

## 2015-12-03 DIAGNOSIS — I10 Essential (primary) hypertension: Secondary | ICD-10-CM | POA: Diagnosis not present

## 2015-12-03 LAB — CBC
HEMATOCRIT: 33.1 % — AB (ref 39.0–52.0)
HEMOGLOBIN: 11.5 g/dL — AB (ref 13.0–17.0)
MCH: 34.7 pg — AB (ref 26.0–34.0)
MCHC: 34.7 g/dL (ref 30.0–36.0)
MCV: 100 fL (ref 78.0–100.0)
Platelets: 89 10*3/uL — ABNORMAL LOW (ref 150–400)
RBC: 3.31 MIL/uL — AB (ref 4.22–5.81)
RDW: 11.8 % (ref 11.5–15.5)
WBC: 4.8 10*3/uL (ref 4.0–10.5)

## 2015-12-03 LAB — COMPREHENSIVE METABOLIC PANEL
ALK PHOS: 54 U/L (ref 38–126)
ALT: 76 U/L — AB (ref 17–63)
ANION GAP: 13 (ref 5–15)
AST: 102 U/L — ABNORMAL HIGH (ref 15–41)
Albumin: 4.3 g/dL (ref 3.5–5.0)
BUN: 12 mg/dL (ref 6–20)
CHLORIDE: 99 mmol/L — AB (ref 101–111)
CO2: 21 mmol/L — ABNORMAL LOW (ref 22–32)
Calcium: 9.2 mg/dL (ref 8.9–10.3)
Creatinine, Ser: 0.85 mg/dL (ref 0.61–1.24)
GFR calc Af Amer: >60 mL/min (ref >60)
GFR calc non Af Amer: >60 mL/min (ref >60)
GLUCOSE: 104 mg/dL — AB (ref 65–99)
POTASSIUM: 3.1 mmol/L — AB (ref 3.5–5.1)
Sodium: 133 mmol/L — ABNORMAL LOW (ref 135–145)
Total Bilirubin: 0.9 mg/dL (ref 0.3–1.2)
Total Protein: 7.9 g/dL (ref 6.5–8.1)

## 2015-12-03 LAB — RAPID URINE DRUG SCREEN, HOSP PERFORMED
AMPHETAMINES: NOT DETECTED
BARBITURATES: NOT DETECTED
Benzodiazepines: NOT DETECTED
COCAINE: NOT DETECTED
Opiates: NOT DETECTED
TETRAHYDROCANNABINOL: NOT DETECTED

## 2015-12-03 LAB — ETHANOL: ALCOHOL ETHYL (B): 385 mg/dL — AB (ref <5)

## 2015-12-03 NOTE — ED Notes (Signed)
Pt here with family. Family sts pt is on a 14 day drinking binge. Pt appears intoxicated in triage and admits to drinking a pint of liquor and a couple of beers today. Pt here because he fell down 6 stairs and has an open wound to right elbow. Bleeding controlled. Bandage not removed in triage. CMS intact distal to injury.

## 2015-12-04 ENCOUNTER — Emergency Department (HOSPITAL_COMMUNITY)
Admission: EM | Admit: 2015-12-04 | Discharge: 2015-12-04 | Disposition: A | Discharge status: Home / Self Care | Payer: PRIVATE HEALTH INSURANCE | Attending: Emergency Medicine | Admitting: Emergency Medicine

## 2015-12-04 DIAGNOSIS — D539 Nutritional anemia, unspecified: Secondary | ICD-10-CM

## 2015-12-04 DIAGNOSIS — F1010 Alcohol abuse, uncomplicated: Secondary | ICD-10-CM

## 2015-12-04 DIAGNOSIS — IMO0002 Reserved for concepts with insufficient information to code with codable children: Secondary | ICD-10-CM

## 2015-12-04 DIAGNOSIS — W19XXXA Unspecified fall, initial encounter: Secondary | ICD-10-CM

## 2015-12-04 MED ORDER — CEPHALEXIN 250 MG PO CAPS
500.0000 mg | ORAL_CAPSULE | Freq: Once | ORAL | Status: AC
  Administered 2015-12-04: 500 mg via ORAL
  Filled 2015-12-04: qty 2

## 2015-12-04 MED ORDER — ONE-A-DAY MENS PO TABS: 1.0000 | ORAL_TABLET | Freq: Every day | ORAL | Status: DC

## 2015-12-04 MED ORDER — POTASSIUM CHLORIDE CRYS ER 20 MEQ PO TBCR
40.0000 meq | EXTENDED_RELEASE_TABLET | Freq: Once | ORAL | Status: AC
  Administered 2015-12-04: 40 meq via ORAL
  Filled 2015-12-04: qty 2

## 2015-12-04 MED ORDER — CHLORDIAZEPOXIDE HCL 25 MG PO CAPS: ORAL_CAPSULE | ORAL | Status: DC

## 2015-12-04 MED ORDER — LIDOCAINE-EPINEPHRINE (PF) 1 %-1:200000 IJ SOLN: 10.0000 mL | Freq: Once | INTRAMUSCULAR | Status: DC

## 2015-12-04 MED ORDER — LIDOCAINE-EPINEPHRINE (PF) 1 %-1:200000 IJ SOLN: 20.0000 mL | Freq: Once | INTRAMUSCULAR | Status: DC

## 2015-12-04 MED ORDER — CEPHALEXIN 500 MG PO CAPS: 500.0000 mg | ORAL_CAPSULE | Freq: Four times a day (QID) | ORAL | Status: AC

## 2015-12-04 MED ORDER — BACITRACIN ZINC 500 UNIT/GM EX OINT
1.0000 "application " | TOPICAL_OINTMENT | Freq: Two times a day (BID) | CUTANEOUS | Status: DC
  Administered 2015-12-04: 1 via TOPICAL

## 2015-12-04 MED ORDER — VITAMIN B-1 100 MG PO TABS: 100.0000 mg | ORAL_TABLET | Freq: Every day | ORAL | Status: DC

## 2015-12-04 MED ORDER — LIDOCAINE-EPINEPHRINE (PF) 2 %-1:200000 IJ SOLN: 20.0000 mL | Freq: Once | INTRAMUSCULAR | Status: DC

## 2015-12-04 MED ORDER — SODIUM CHLORIDE 0.9 % IV BOLUS (SEPSIS): 1000.0000 mL | Freq: Once | INTRAVENOUS | Status: DC

## 2015-12-04 MED ORDER — ADULT MULTIVITAMIN W/MINERALS CH
1.0000 | ORAL_TABLET | Freq: Once | ORAL | Status: AC
  Administered 2015-12-04: 1 via ORAL
  Filled 2015-12-04: qty 1

## 2015-12-04 MED ORDER — LIDOCAINE-EPINEPHRINE 1 %-1:100000 IJ SOLN
10.0000 mL | Freq: Once | INTRAMUSCULAR | Status: AC
  Administered 2015-12-04: 10 mL via INTRADERMAL
  Filled 2015-12-04: qty 1

## 2015-12-04 NOTE — ED Notes (Signed)
Pt ambulated in hall with no difficulties. 

## 2015-12-04 NOTE — Discharge Instructions (Signed)
Laceration Care, Adult °A laceration is a cut that goes through all of the layers of the skin and into the tissue that is right under the skin. Some lacerations heal on their own. Others need to be closed with stitches (sutures), staples, skin adhesive strips, or skin glue. Proper laceration care minimizes the risk of infection and helps the laceration to heal better. °HOW TO CARE FOR YOUR LACERATION °If sutures or staples were used: °· Keep the wound clean and dry. °· If you were given a bandage (dressing), you should change it at least one time per day or as told by your health care provider. You should also change it if it becomes wet or dirty. °· Keep the wound completely dry for the first 24 hours or as told by your health care provider. After that time, you may shower or bathe. However, make sure that the wound is not soaked in water until after the sutures or staples have been removed. °· Clean the wound one time each day or as told by your health care provider: °¨ Wash the wound with soap and water. °¨ Rinse the wound with water to remove all soap. °¨ Pat the wound dry with a clean towel. Do not rub the wound. °· After cleaning the wound, apply a thin layer of antibiotic ointment as told by your health care provider. This will help to prevent infection and keep the dressing from sticking to the wound. °· Have the sutures or staples removed as told by your health care provider. °If skin adhesive strips were used: °· Keep the wound clean and dry. °· If you were given a bandage (dressing), you should change it at least one time per day or as told by your health care provider. You should also change it if it becomes dirty or wet. °· Do not get the skin adhesive strips wet. You may shower or bathe, but be careful to keep the wound dry. °· If the wound gets wet, pat it dry with a clean towel. Do not rub the wound. °· Skin adhesive strips fall off on their own. You may trim the strips as the wound heals. Do not  remove skin adhesive strips that are still stuck to the wound. They will fall off in time. °If skin glue was used: °· Try to keep the wound dry, but you may briefly wet it in the shower or bath. Do not soak the wound in water, such as by swimming. °· After you have showered or bathed, gently pat the wound dry with a clean towel. Do not rub the wound. °· Do not do any activities that will make you sweat heavily until the skin glue has fallen off on its own. °· Do not apply liquid, cream, or ointment medicine to the wound while the skin glue is in place. Using those may loosen the film before the wound has healed. °· If you were given a bandage (dressing), you should change it at least one time per day or as told by your health care provider. You should also change it if it becomes dirty or wet. °· If a dressing is placed over the wound, be careful not to apply tape directly over the skin glue. Doing that may cause the glue to be pulled off before the wound has healed. °· Do not pick at the glue. The skin glue usually remains in place for 5-10 days, then it falls off of the skin. °General Instructions °· Take over-the-counter and prescription   medicines only as told by your health care provider.  If you were prescribed an antibiotic medicine or ointment, take or apply it as told by your doctor. Do not stop using it even if your condition improves.  To help prevent scarring, make sure to cover your wound with sunscreen whenever you are outside after stitches are removed, after adhesive strips are removed, or when glue remains in place and the wound is healed. Make sure to wear a sunscreen of at least 30 SPF.  Do not scratch or pick at the wound.  Keep all follow-up visits as told by your health care provider. This is important.  Check your wound every day for signs of infection. Watch for:  Redness, swelling, or pain.  Fluid, blood, or pus.  Raise (elevate) the injured area above the level of your heart  while you are sitting or lying down, if possible. SEEK MEDICAL CARE IF:  You received a tetanus shot and you have swelling, severe pain, redness, or bleeding at the injection site.  You have a fever.  A wound that was closed breaks open.  You notice a bad smell coming from your wound or your dressing.  You notice something coming out of the wound, such as wood or glass.  Your pain is not controlled with medicine.  You have increased redness, swelling, or pain at the site of your wound.  You have fluid, blood, or pus coming from your wound.  You notice a change in the color of your skin near your wound.  You need to change the dressing frequently due to fluid, blood, or pus draining from the wound.  You develop a new rash.  You develop numbness around the wound. SEEK IMMEDIATE MEDICAL CARE IF:  You develop severe swelling around the wound.  Your pain suddenly increases and is severe.  You develop painful lumps near the wound or on skin that is anywhere on your body.  You have a red streak going away from your wound.  The wound is on your hand or foot and you cannot properly move a finger or toe.  The wound is on your hand or foot and you notice that your fingers or toes look pale or bluish.   This information is not intended to replace advice given to you by your health care provider. Make sure you discuss any questions you have with your health care provider.   Document Released: 05/30/2005 Document Revised: 10/14/2014 Document Reviewed: 05/26/2014 Elsevier Interactive Patient Education 2016 [employer]   Sutured Wound Care Sutures are stitches that can be used to close wounds. Taking care of your wound properly can help to prevent pain and infection. It can also help your wound to heal more quickly. HOW TO CARE FOR YOUR SUTURED WOUND Wound Care  Keep the wound clean and dry.  If you were given a bandage (dressing), you should change it at least once per day or  as directed by your health care provider. You should also change it if it becomes wet or dirty.  Keep the wound completely dry for the first 24 hours or as directed by your health care provider. After that time, you may shower or bathe. However, make sure that the wound is not soaked in water until the sutures have been removed.  Clean the wound one time each day or as directed by your health care provider.  Wash the wound with soap and water.  Rinse the wound with water to remove all  soap.  Pat the wound dry with a clean towel. Do not rub the wound.  Aftercleaning the wound, apply a thin layer of antibioticointment as directed by your health care provider. This will help to prevent infection and keep the dressing from sticking to the wound.  Have the sutures removed as directed by your health care provider. General Instructions  Take or apply medicines only as directed by your health care provider.  To help prevent scarring, make sure to cover your wound with sunscreen whenever you are outside after the sutures are removed and the wound is healed. Make sure to wear a sunscreen of at least 30 SPF.  If you were prescribed an antibiotic medicine or ointment, finish all of it even if you start to feel better.  Do not scratch or pick at the wound.  Keep all follow-up visits as directed by your health care provider. This is important.  Check your wound every day for signs of infection. Watch for:   Redness, swelling, or pain.  Fluid, blood, or pus.  Raise (elevate) the injured area above the level of your heart while you are sitting or lying down, if possible.  Avoid stretching your wound.  Drink enough fluids to keep your urine clear or pale yellow. SEEK MEDICAL CARE IF:  You received a tetanus shot and you have swelling, severe pain, redness, or bleeding at the injection site.  You have a fever.  A wound that was closed breaks open.  You notice a bad smell coming from  the wound.  You notice something coming out of the wound, such as wood or glass.  Your pain is not controlled with medicine.  You have increased redness, swelling, or pain at the site of your wound.  You have fluid, blood, or pus coming from your wound.  You notice a change in the color of your skin near your wound.  You need to change the dressing frequently due to fluid, blood, or pus draining from the wound.  You develop a new rash.  You develop numbness around the wound. SEEK IMMEDIATE MEDICAL CARE IF:  You develop severe swelling around the injury site.  Your pain suddenly increases and is severe.  You develop painful lumps near the wound or on skin that is anywhere on your body.  You have a red streak going away from your wound.  The wound is on your hand or foot and you cannot properly move a finger or toe.  The wound is on your hand or foot and you notice that your fingers or toes look pale or bluish.   This information is not intended to replace advice given to you by your health care provider. Make sure you discuss any questions you have with your health care provider.   Document Released: 07/07/2004 Document Revised: 10/14/2014 Document Reviewed: 01/09/2013 Elsevier Interactive Patient Education 2016 [employer]   Alcohol Use Disorder Alcohol use disorder is a mental disorder. It is not a one-time incident of heavy drinking. Alcohol use disorder is the excessive and uncontrollable use of alcohol over time that leads to problems with functioning in one or more areas of daily living. People with this disorder risk harming themselves and others when they drink to excess. Alcohol use disorder also can cause other mental disorders, such as mood and anxiety disorders, and serious physical problems. People with alcohol use disorder often misuse other drugs.  Alcohol use disorder is common and widespread. Some people with this disorder drink alcohol  to cope with or escape  from negative life events. Others drink to relieve chronic pain or symptoms of mental illness. People with a family history of alcohol use disorder are at higher risk of losing control and using alcohol to excess.  Drinking too much alcohol can cause injury, accidents, and health problems. One drink can be too much when you are:  Working.  Pregnant or breastfeeding.  Taking medicines. Ask your doctor.  Driving or planning to drive. SYMPTOMS  Signs and symptoms of alcohol use disorder may include the following:   Consumption ofalcohol inlarger amounts or over a longer period of time than intended.  Multiple unsuccessful attempts to cutdown or control alcohol use.   A great deal of time spent obtaining alcohol, using alcohol, or recovering from the effects of alcohol (hangover).  A strong desire or urge to use alcohol (cravings).   Continued use of alcohol despite problems at work, school, or home because of alcohol use.   Continued use of alcohol despite problems in relationships because of alcohol use.  Continued use of alcohol in situations when it is physically hazardous, such as driving a car.  Continued use of alcohol despite awareness of a physical or psychological problem that is likely related to alcohol use. Physical problems related to alcohol use can involve the brain, heart, liver, stomach, and intestines. Psychological problems related to alcohol use include intoxication, depression, anxiety, psychosis, delirium, and dementia.   The need for increased amounts of alcohol to achieve the same desired effect, or a decreased effect from the consumption of the same amount of alcohol (tolerance).  Withdrawal symptoms upon reducing or stopping alcohol use, or alcohol use to reduce or avoid withdrawal symptoms. Withdrawal symptoms include:  Racing heart.  Hand tremor.  Difficulty  sleeping.  Nausea.  Vomiting.  Hallucinations.  Restlessness.  Seizures. DIAGNOSIS Alcohol use disorder is diagnosed through an assessment by your health care provider. Your health care provider may start by asking three or four questions to screen for excessive or problematic alcohol use. To confirm a diagnosis of alcohol use disorder, at least two symptoms must be present within a 31-month period. The severity of alcohol use disorder depends on the number of symptoms:  Mild--two or three.  Moderate--four or five.  Severe--six or more. Your health care provider may perform a physical exam or use results from lab tests to see if you have physical problems resulting from alcohol use. Your health care provider may refer you to a mental health professional for evaluation. TREATMENT  Some people with alcohol use disorder are able to reduce their alcohol use to low-risk levels. Some people with alcohol use disorder need to quit drinking alcohol. When necessary, mental health professionals with specialized training in substance use treatment can help. Your health care provider can help you decide how severe your alcohol use disorder is and what type of treatment you need. The following forms of treatment are available:   Detoxification. Detoxification involves the use of prescription medicines to prevent alcohol withdrawal symptoms in the first week after quitting. This is important for people with a history of symptoms of withdrawal and for heavy drinkers who are likely to have withdrawal symptoms. Alcohol withdrawal can be dangerous and, in severe cases, cause death. Detoxification is usually provided in a hospital or in-patient substance use treatment facility.  Counseling or talk therapy. Talk therapy is provided by substance use treatment counselors. It addresses the reasons people use alcohol and ways to keep them from  drinking again. The goals of talk therapy are to help people with alcohol  use disorder find healthy activities and ways to cope with life stress, to identify and avoid triggers for alcohol use, and to handle cravings, which can cause relapse.  Medicines.Different medicines can help treat alcohol use disorder through the following actions:  Decrease alcohol cravings.  Decrease the positive reward response felt from alcohol use.  Produce an uncomfortable physical reaction when alcohol is used (aversion therapy).  Support groups. Support groups are run by people who have quit drinking. They provide emotional support, advice, and guidance. These forms of treatment are often combined. Some people with alcohol use disorder benefit from intensive combination treatment provided by specialized substance use treatment centers. Both inpatient and outpatient treatment programs are available.   This information is not intended to replace advice given to you by your health care provider. Make sure you discuss any questions you have with your health care provider.   Document Released: 07/07/2004 Document Revised: 06/20/2014 Document Reviewed: 09/06/2012 Elsevier Interactive Patient Education 2016 [employer].  Alcohol Abuse and Nutrition Alcohol abuse is any pattern of alcohol consumption that harms your health, relationships, or work. Alcohol abuse can affect how your body breaks down and absorbs nutrients from food by causing your liver to work abnormally. Additionally, many people who abuse alcohol do not eat enough carbohydrates, protein, fat, vitamins, and minerals. This can cause poor nutrition (malnutrition) and a lack of nutrients (nutrient deficiencies), which can lead to further complications. Nutrients that are commonly lacking (deficient) among people who abuse alcohol include:  Vitamins.  Vitamin A. This is stored in your liver. It is important for your vision, metabolism, and ability to fight off infections (immunity).  B vitamins. These include vitamins such  as folate, thiamin, and niacin. These are important in new cell growth and maintenance.  Vitamin C. This plays an important role in iron absorption, wound healing, and immunity.  Vitamin D. This is produced by your liver, but you can also get vitamin D from food. Vitamin D is necessary for your body to absorb and use calcium.  Minerals.  Calcium. This is important for your bones and your heart and blood vessel (cardiovascular) function.  Iron. This is important for blood, muscle, and nervous system functioning.  Magnesium. This plays an important role in muscle and nerve function, and it helps to control blood sugar and blood pressure.  Zinc. This is important for the normal function of your nervous system and digestive system (gastrointestinal tract). Nutrition is an essential component of therapy for alcohol abuse. Your health care provider or dietitian will work with you to design a plan that can help restore nutrients to your body and prevent potential complications. WHAT IS MY PLAN? Your dietitian may develop a specific diet plan that is based on your condition and any other complications you may have. A diet plan will commonly include:  A balanced diet.  Grains: 6-8 oz per day.  Vegetables: 2-3 cups per day.  Fruits: 1-2 cups per day.  Meat and other protein: 5-6 oz per day.  Dairy: 2-3 cups per day.  Vitamin and mineral supplements. WHAT DO I NEED TO KNOW ABOUT ALCOHOL AND NUTRITION?  Consume foods that are high in antioxidants, such as grapes, berries, nuts, green tea, and dark green and orange vegetables. This can help to counteract some of the stress that is placed on your liver by consuming alcohol.  Avoid food and drinks that are high in  fat and sugar. Foods such as sugared soft drinks, salty snack foods, and candy contain empty calories. This means that they lack important nutrients such as protein, fiber, and vitamins.  Eat frequent meals and snacks. Try to eat 5-6  small meals each day.  Eat a variety of fresh fruits and vegetables each day. This will help you get plenty of water, fiber, and vitamins in your diet.  Drink plenty of water and other clear fluids. Try to drink at least 48-64 oz (1.5-2 L) of water per day.  If you are a vegetarian, eat a variety of protein-rich foods. Pair whole grains with plant-based proteins at meals and snacks to obtain the greatest nutrient benefit from your food. For example, eat rice with beans, put peanut butter on whole-grain toast, or eat oatmeal with sunflower seeds.  Soak beans and whole grains overnight before cooking. This can help your body to absorb the nutrients more easily.  Include foods fortified with vitamins and minerals in your diet. Commonly fortified foods include milk, orange juice, cereal, and bread.  If you are malnourished, your dietitian may recommend a high-protein, high-calorie diet. This may include:  2,000-3,000 calories (kilocalories) per day.  70-100 grams of protein per day.  Your health care provider may recommend a complete nutritional supplement beverage. This can help to restore calories, protein, and vitamins to your body. Depending on your condition, you may be advised to consume this instead of or in addition to meals.  Limit your intake of caffeine. Replace drinks like coffee and black tea with decaffeinated coffee and herbal tea.  Eat a variety of foods that are high in omega fatty acids. These include fish, nuts and seeds, and soybeans. These foods may help your liver to recover and may also stabilize your mood.  Certain medicines may cause changes in your appetite, taste, and weight. Work with your health care provider and dietitian to make any adjustments to your medicines and diet plan.  Include other healthy lifestyle choices in your daily routine.  Be physically active.  Get enough sleep.  Spend time doing activities that you enjoy.  If you are unable to take in  enough food and calories by mouth, your health care provider may recommend a feeding tube. This is a tube that passes through your nose and throat, directly into your stomach. Nutritional supplement beverages can be given to you through the feeding tube to help you get the nutrients you need.  Take vitamin or mineral supplements as recommended by your health care provider. WHAT FOODS CAN I EAT? Grains Enriched pasta. Enriched rice. Fortified whole-grain bread. Fortified whole-grain cereal. Barley. Brown rice. Quinoa. Millet. Vegetables All fresh, frozen, and canned vegetables. Spinach. Kale. Artichoke. Carrots. Winter squash and pumpkin. Sweet potatoes. Broccoli. Cabbage. Cucumbers. Tomatoes. Sweet peppers. Green beans. Peas. Corn. Fruits All fresh and frozen fruits. Berries. Grapes. Mango. Papaya. Guava. Cherries. Apples. Bananas. Peaches. Plums. Pineapple. Watermelon. Cantaloupe. Oranges. Avocado. Meats and Other Protein Sources Beef liver. Lean beef. Pork. Fresh and canned chicken. Fresh fish. Oysters. Sardines. Canned tuna. Shrimp. Eggs with yolks. Nuts and seeds. Peanut butter. Beans and lentils. Soybeans. Tofu. Dairy Whole, low-fat, and nonfat milk. Whole, low-fat, and nonfat yogurt. Cottage cheese. Sour cream. Hard and soft cheeses. Beverages Water. Herbal tea. Decaffeinated coffee. Decaffeinated green tea. 100% fruit juice. 100% vegetable juice. Instant breakfast shakes. Condiments Ketchup. Mayonnaise. Mustard. Salad dressing. Barbecue sauce. Sweets and Desserts Sugar-free ice cream. Sugar-free pudding. Sugar-free gelatin. Fats and Oils Butter. Vegetable oil, flaxseed  oil, olive oil, and walnut oil. Other Complete nutrition shakes. Protein bars. Sugar-free gum. The items listed above may not be a complete list of recommended foods or beverages. Contact your dietitian for more options. WHAT FOODS ARE NOT RECOMMENDED? Grains Sugar-sweetened breakfast cereals. Flavored instant  oatmeal. Fried breads. Vegetables Breaded or deep-fried vegetables. Fruits Dried fruit with added sugar. Candied fruit. Canned fruit in syrup. Meats and Other Protein Sources Breaded or deep-fried meats. Dairy Flavored milks. Fried cheese curds or fried cheese sticks. Beverages Alcohol. Sugar-sweetened soft drinks. Sugar-sweetened tea. Caffeinated coffee and tea. Condiments Sugar. Honey. Agave nectar. Molasses. Sweets and Desserts Chocolate. Cake. Cookies. Candy. Other Potato chips. Pretzels. Salted nuts. Candied nuts. The items listed above may not be a complete list of foods and beverages to avoid. Contact your dietitian for more information.   This information is not intended to replace advice given to you by your health care provider. Make sure you discuss any questions you have with your health care provider.   Document Released: 03/24/2005 Document Revised: 06/20/2014 Document Reviewed: 12/31/2013 Elsevier Interactive Patient Education 2016 [employer].  Alcohol Withdrawal Alcohol withdrawal is a group of symptoms that can develop when a person who drinks heavily and regularly stops drinking or drinks less. CAUSES Heavy and regular drinking can cause chemicals that send signals from the brain to the body (neurotransmitters) to deactivate. Alcohol withdrawal develops when deactivated neurotransmitters reactivate because a person stops drinking or drinks less. RISK FACTORS The more a person drinks and the longer he or she drinks, the greater the risk of alcohol withdrawal. Severe withdrawal is more likely to develop in someone who:  Had severe alcohol withdrawal in the past.  Had a seizure during a previous episode of alcohol withdrawal.  Is elderly.  Is pregnant.  Has been abusing drugs.  Has other medical problems, including:  Infection.  Heart, lung, or liver disease.  Seizures.  Mental health problems. SYMPTOMS Symptoms of this condition can be mild to  moderate, or they can be severe. Mild to moderate symptoms may include:  Fatigue.  Nightmares.  Trouble sleeping.  Depression.  Anxiety.  Inability to think clearly.  Mood swings.  Irritability.  Loss of appetite.  Nausea or vomiting.  Clammy skin.  Extreme sweating.  Rapid heartbeat.  Shakiness.  Uncontrollable shaking (tremor). Severe symptoms may include:  Fever.  Seizures.  Severeconfusion.  Feeling or seeing things that are not there (hallucinations). Symptoms usually begin within eight hours after a person stops drinking or drinks less. They can last for weeks. DIAGNOSIS Alcohol withdrawal is diagnosed with a medical history and physical exam. Sometimes, urine and blood tests are also done. TREATMENT Treatment may involve:  Monitoring blood pressure, pulse, and breathing.  Getting fluids through an IV tube.  Medicine to reduce anxiety.  Medicine to prevent or control seizures.  Multivitamins and B vitamins.  Having a health care provider check on you daily. If symptoms are moderate to severe or if there is a risk of severe withdrawal, treatment may be done at a hospital or treatment center. HOME CARE INSTRUCTIONS  Take medicines and vitamin supplements only as directed by your health care provider.  Do not drink alcohol.  Have someone stay with you or be available if you need help.  Drink enough fluid to keep your urine clear or pale yellow.  Consider joining a 12-step program or another alcohol support group. SEEK MEDICAL CARE IF:  Your symptoms get worse or do not go away.  You  cannot keep food or water in your stomach.  You are struggling with not drinking alcohol.  You cannot stop drinking alcohol. SEEK IMMEDIATE MEDICAL CARE IF:   You have an irregular heartbeat.  You have chest pain.  You have trouble breathing.  You have symptoms of severe withdrawal, such as:  A fever.  Seizures.  Severe  confusion.  Hallucinations.   This information is not intended to replace advice given to you by your health care provider. Make sure you discuss any questions you have with your health care provider.   Document Released: 03/09/2005 Document Revised: 06/20/2014 Document Reviewed: 03/18/2014 Elsevier Interactive Patient Education 2016 [employer].  [employer] Guide Outpatient Counseling/Substance Abuse Adult The United Ways 211 is a great source of information about community services available.  Access by dialing 2-1-1 from anywhere in [place], or by website -  PooledIncome.pl.   Other Local Resources (Updated 06/2015)  Crisis Hotlines   Services     Area Served  [employer]  Crisis Hotline, available 24 hours a day, 7 days a week: ([phone] [facility], [name]   [facility]  Crisis Hotline, available 24 hours a day, 7 days a week: 909-731-8077 [facility], [name]  [facility]  Suicide Prevention Hotline, available 24 hours a day, 7 days a week: 810-813-8693 [facility], [name]  [employer], available 24 hours a day, 7 days a week: 979-211-1461 [facility], [name]   [facility] Access to [employer], available 24 hours a day, 7 days a week: 802-520-9980 All   [facility]  Crisis Hotline, available 24 hours a day, 7 days a week: 352-686-0784 All   Other Local Resources (Updated 06/2015)  Outpatient Counseling/ Substance Abuse Programs  Services     Address and Phone Number  ADS (Alcohol and Drug Services)   Options include Individual counseling, group counseling, intensive outpatient program (several hours a day, several days a week)  Offers depression assessments  Provides methadone maintenance program [phone] [address] [place], [name] [address]   [facility]   Offers partial hospitalization/day treatment and DUI/DWI  programs  [employer], private insurance [phone] [address] [place], [name] [address]  [facility]    Services include intensive outpatient program (several hours a day, several days a week), outpatient treatment, DUI/DWI services, family education  Also has some services specifically for [employer] transitional housing  [phone] [address] [place], [name] [address]     [facility]  [employer], private pay, and private insurance [phone] [address] [place], [name] [address]  [employer] [facility]  Services include individual counseling, substance abuse intensive outpatient program (several hours a day, several days a week), day treatment  Delene Loll, Medicaid, private insurance [phone] [address], [address] [place], [name] [address]  [facility]   Offers substance abuse intensive outpatient program (several hours a day, several days a week), partial hospitalization program [phone] [address] [place], [name] [address]  [phone] [address] [place], [name] [address]  [phone] [address] [place], [name] [address]  [phone], [address] [place], [name] [address]  [facility]  Individual counseling only  Accepts private insurance only [phone] [address] [place], [name] [address]  [facility]  Methadone maintenance program [phone] [address] [place], [name] [address]  [facility]  Walk-In Clinic providing substance abuse and mental health counseling  Accepts Medicaid, Medicare, private insurance  Offers sliding scale for uninsured [phone] [address] [place], [place]   [facility] [employer]  Offers individual counseling, and intensive in-home services [phone] [address], [address] [place], [place] [address]  [facility]  Offers individual counseling, family counseling, group therapy, domestic violence counseling, consumer credit counseling  Accepts Medicare, Medicaid, private insurance  Offers sliding scale for uninsured [phone] [address] [place], [place] [address]  [phone] [facility], [address] [place], [place] [address]  [facility]  Offers individual, family and group counseling  3 locations - [place], [place], and [place]  [phone]  [address] [place], [place] [address]  [address] [place], [place] [address]  [address] [place], [place] [address]  [facility]    Offers psychiatric assessment, 8-week Intensive Outpatient Program (several hours a day, several times a week, daytime or evenings), early recovery group, family Program, medication management  Private pay or private insurance only [phone], or  [phone] [address] [place], [place] [address]  [facility]  Offers individual, couples and family counseling  Accepts Medicaid, private insurance, and sliding scale for uninsured [phone] [address] [place], [place] [address]  Len Blalockavid Fuller, MD  Individual counseling  Private insurance [phone] [address] [place], [place] [address]  [facility]   Offers assessment, substance abuse treatment, and behavioral health treatment [phone] [address] [place], [place] [address]  [facility]  Individual counseling  Accepts private insurance [phone] [address] [place], [place] [address]  [facility]  Individual counseling  Delene Lollccepts Medicare, private insurance [phone] [address] [place], [place] [address]  [facility]    Offers intensive outpatient program (several hours a day, several times a week)  Private pay, private  insurance [phone] [facility] [address] [place], [place]  [facility]  Individual counseling  Medicare, private insurance [phone] [address], [address] [place], [place] [address]  [facility]    Offers intensive outpatient program (several hours a day, several times a week) and partial hospitalization program [phone] [address] [place], [place] [address]  Emerson MonteParrish McKinney, MD  Individual counseling [phone] [address], [address] [place], [place] [address]  [facility]  Offers Christian counseling to individuals, couples, and families  Accepts Medicare and private insurance; offers sliding scale for uninsured [phone] [address] [place], [place] [address]  [facility]  [facility] [phone] [address], [address] [place], [place] [address]  [facility]   Offers crisis counseling, individual counseling, group therapy, in-home therapy, domestic violence services, day treatment, DWI services, Administrator, artsCommunity Support Team (CST), Assertive Community Treatment Team (ACTT), substance abuse Intensive Outpatient Program (several hours a day, several times a week)  2 locations - [place] and [place] [phone] [address] [place], [place] [address]  [phone] [address] [place], [place] [address]  [facility]     Individual counseling and group therapy  Accepts private insurance, [place]icare, [place]icaid [phone] [address] [place], [place]  [facility]  Offers individual and family counseling  Offers LGBTQ services  Accepts private insurance and private pay [phone] [address] [place], [place] [address]  [facility]    Offers individual counseling, group therapy, and outpatient detox  Accepts private  insurance [phone] [address] [place], [name]  [facility]  Individual counseling  Accepts Medicare, private insurance [phone] [address] [place], [name] [address]  [employer]  Individual counseling  Accepts Medicare, private insurance [phone] [address] [place], [name] [address]  [facility]   Offers substance abuse Intensive Outpatient Program (several hours a day, several times a week) [phone], or [phone] [place], [name]

## 2015-12-04 NOTE — ED Provider Notes (Signed)
CSN: [id number]     Arrival date & time 12/03/15  2204 History   First MD Initiated Contact with Patient 12/04/15 [id number]     Chief Complaint  Patient presents with  . Extremity Laceration  . Alcohol Intoxication     (Consider location/radiation/quality/duration/timing/severity/associated sxs/prior Treatment) HPI   Patient is a 55 year old male history of hypertension and alcohol abuse. He presents emergency Department with his family member after falling down carpeted stairs in their home. The patient was at the bedside states that she heard him fall and she ran to find him sprawled out with his head down the bottom 6 carpeted stairs. He was alert but did appear intoxicated and had blood coming from his right elbow from a cut.  He damaged the railing during the fall.  The patient's denies any other injuries, denies syncope, head injury, neck pain.  Patient states that he drank a pint of liquor and several beers today, [place] in [place], and family member confirms that he's been on a 14 day drinking binge. He denies visual disturbances, headache, nausea, vomiting, chest pain, abdominal pain.  No other complaints.   Past Medical History  Diagnosis Date  . Hypertension   . Allergy   . Distal radius fracture, left    Past Surgical History  Procedure Laterality Date  . Tonsillectomy    . Open reduction internal fixation (orif) distal radial fracture Left 01/28/2015    Procedure: OPEN REDUCTION INTERNAL FIXATION (ORIF) LEFT DISTAL RADIAL FRACTURE;  Surgeon: Tarry KosNaiping M Xu, MD;  Location:  SURGERY CENTER;  Service: Orthopedics;  Laterality: Left;   Family History  Problem Relation Age of Onset  . Heart disease Mother   . Hyperlipidemia Mother    Social History  Substance Use Topics  . Smoking status: Current Every Day Smoker -- 1.00 packs/day    Types: Cigarettes  . Smokeless tobacco: Current User    Types: Chew  . Alcohol Use: 9.0 oz/week    15 Cans of beer  per week     Comment: daily    Review of Systems  HENT: Negative.   Eyes: Negative.  Negative for photophobia and visual disturbance.  Respiratory: Negative.   Cardiovascular: Negative.   Gastrointestinal: Negative.   Musculoskeletal: Negative for back pain, gait problem, neck pain and neck stiffness.  Skin: Positive for wound. Negative for color change, pallor and rash.  All other systems reviewed and are negative.     Allergies  Review of patient's allergies indicates no known allergies.  Home Medications   Prior to Admission medications   Medication Sig Start Date End Date Taking? Authorizing Provider  aspirin EC 81 MG tablet Take 81 mg by mouth daily.   Yes Historical Provider, MD  lisinopril-hydrochlorothiazide (PRINZIDE,ZESTORETIC) 10-12.5 MG per tablet Take 1 tablet by mouth daily. 01/22/15  Yes Wallis BambergMario Mani, PA-C   BP 112/87 mmHg  Pulse 70  Temp(Src) 97.8 F (36.6 C) (Oral)  Resp 16  Ht 5\' 11"  (1.803 m)  Wt 69.4 kg  BMI 21.35 kg/m2  SpO2 99% Physical Exam  Constitutional: He is oriented to person, place, and time. Vital signs are normal. He appears well-developed and well-nourished. He is cooperative.  Non-toxic appearance. He does not have a sickly appearance. No distress.  Thin male, well appearing  HENT:  Head: Normocephalic and atraumatic. Head is without raccoon's eyes, without Battle's sign, without abrasion, without contusion, without laceration, without right periorbital erythema and without left periorbital erythema.  Right Ear: Tympanic  membrane, external ear and ear canal normal. No drainage. No hemotympanum.  Left Ear: Tympanic membrane, external ear and ear canal normal. No drainage. No hemotympanum.  Nose: Nose normal. No rhinorrhea or nose lacerations. No epistaxis.  Mouth/Throat: Uvula is midline, oropharynx is clear and moist and mucous membranes are normal. Mucous membranes are not pale, not dry and not cyanotic. No oropharyngeal exudate.  Skull  atraumatic without visible areas of abrasion, hematoma, no bruising, bilateral TMs normal appearance without hemotympanum, nose normal appearance without any epistaxis, no nasal discharge normal conjunctiva, palpebral conjunctiva and without pallor, no scleral icterus  Eyes: Conjunctivae, EOM and lids are normal. Pupils are equal, round, and reactive to light. Right eye exhibits no chemosis and no discharge. Left eye exhibits no chemosis and no discharge. No scleral icterus.  Neck: Normal range of motion, full passive range of motion without pain and phonation normal. Neck supple. No JVD present. No spinous process tenderness and no muscular tenderness present. No tracheal deviation and normal range of motion present. No thyromegaly present.  No cervical midline tenderness, no step-off, no paraspinal tenderness to palpation, normal range,   Cardiovascular: Normal rate, regular rhythm, normal heart sounds and intact distal pulses.  Exam reveals no gallop and no friction rub.   No murmur heard. Symmetrical radial pulses 2+, DP pulses 2+  Pulmonary/Chest: Effort normal and breath sounds normal. No respiratory distress. He has no wheezes. He has no rales. He exhibits no tenderness.  Abdominal: Soft. Bowel sounds are normal. He exhibits no distension and no mass. There is no tenderness. There is no rebound and no guarding.  Musculoskeletal: Normal range of motion. He exhibits no edema or tenderness.  Lymphadenopathy:    He has no cervical adenopathy.  Neurological: He is alert and oriented to person, place, and time. He has normal strength and normal reflexes. He is not disoriented. He displays no tremor. No cranial nerve deficit or sensory deficit. He exhibits normal muscle tone. He displays no seizure activity. Coordination and gait normal.  Skin: Skin is warm and dry. Laceration noted. No rash noted. He is not diaphoretic. No erythema. No pallor.  Right elbow with 2 cm laceration, gaping, no active  bleeding  Psychiatric: He has a normal mood and affect. Judgment and thought content normal. His affect is not blunt. His speech is delayed. He is slowed and withdrawn. Cognition and memory are normal.  Nursing note and vitals reviewed.   ED Course  Procedures (including critical care time)  LACERATION REPAIR Performed by: Danelle Berry Consent: Verbal consent obtained. Risks and benefits: risks, benefits and alternatives were discussed Patient identity confirmed: provided demographic data Time out performed prior to procedure Prepped and Draped in normal sterile fashion Wound explored Laceration Location: right elbow Laceration Length: 2 cm No Foreign Bodies seen or palpated Anesthesia: local infiltration Local anesthetic: lidocaine 1% with epinephrine Anesthetic total: 4 ml Irrigation method: syringe Amount of cleaning: standard Skin closure: 4.0 prolene Number of sutures or staples: 4 Technique: simple interrupted Patient tolerance: Patient tolerated the procedure well with no immediate complications. 2  Labs Review Labs Reviewed  COMPREHENSIVE METABOLIC PANEL - Abnormal; Notable for the following:    Sodium 133 (*)    Potassium 3.1 (*)    Chloride 99 (*)    CO2 21 (*)    Glucose, Bld 104 (*)    AST 102 (*)    ALT 76 (*)    All other components within normal limits  ETHANOL - Abnormal; Notable for  the following:    Alcohol, Ethyl (B) 385 (*)    All other components within normal limits  CBC - Abnormal; Notable for the following:    RBC 3.31 (*)    Hemoglobin 11.5 (*)    HCT 33.1 (*)    MCH 34.7 (*)    Platelets 89 (*)    All other components within normal limits  URINE RAPID DRUG SCREEN, HOSP PERFORMED    Imaging Review Dg Elbow Complete Right  12/03/2015  CLINICAL DATA:  Status post fall down 6 stairs, with open wound at the right elbow. Initial encounter. EXAM: RIGHT ELBOW - COMPLETE 3+ VIEW COMPARISON:  None. FINDINGS: There is no evidence of fracture or  dislocation. The visualized joint spaces are preserved. No significant joint effusion is identified. Mild medial soft tissue swelling is noted at the elbow. A dorsal soft tissue laceration is noted overlying the olecranon. IMPRESSION: No evidence of fracture or dislocation. Electronically Signed   By: Roanna RaiderJeffery  Chang M.D.   On: 12/03/2015 23:05   I have personally reviewed and evaluated these images and lab results as part of my medical decision-making.   EKG Interpretation None      MDM   Intoxicated 55 year old male presents to the emergency room after falling down half a flight of stairs at home, carpeted, patient sustained a laceration to his right elbow, he denies any other injuries, denies head injury, no headache, no neck pain, no syncope. He is coming by family member, at the time of presentation in triage he appeared intoxicated, at the time of my exam he had no slurred speech, but was slowed in his responses and somewhat blunted affect. On exam he had no visible injuries other than a 2 cm laceration on his right elbow. Films of her elbow were negative for fracture dislocation. Laceration repaired.  Labs obtained show microcytic anemia, consistent with alcohol abuse, pancytopenia, BAL was 385, patient also had hyperkalemia, 3.1. Also found to have elevated AST and ALT also consistent with EtOH abuse.  Patient was observed in the ER.  He had no neurological deficit, was able to eat and drink and ambulate without difficulty.  Patient stated he knew he should quit drinking alcohol, but probably would not. He was given resources, Librium taper for discharge home, with thiamine and multivitamin.  In the ER oral potassium replacement was tolerated.   Will cover laceration with keflex, for location and gapping injury that occurred 8 hours prior to repair. Wound care reviewed.  Return precautions reviewed.  Printed instructions and resources given.    Final diagnoses:  ETOH abuse  Laceration   Fall, initial encounter  Macrocytic anemia      Danelle BerryLeisa Aariz Maish, PA-C 12/04/15 0920  Alvira MondayErin Schlossman, MD 12/07/15 [id number]

## 2015-12-04 NOTE — ED Notes (Signed)
EDP at bedside  

## 2016-01-30 ENCOUNTER — Other Ambulatory Visit: Payer: Self-pay | Admitting: Urgent Care

## 2016-01-30 DIAGNOSIS — I10 Essential (primary) hypertension: Secondary | ICD-10-CM

## 2016-02-12 ENCOUNTER — Encounter: Payer: Self-pay | Admitting: Family Medicine

## 2016-02-12 ENCOUNTER — Ambulatory Visit (INDEPENDENT_AMBULATORY_CARE_PROVIDER_SITE_OTHER): Payer: PRIVATE HEALTH INSURANCE | Admitting: Family Medicine

## 2016-02-12 VITALS — BP 168/89 | HR 70 | Wt 163.0 lb

## 2016-02-12 DIAGNOSIS — I10 Essential (primary) hypertension: Secondary | ICD-10-CM

## 2016-02-12 DIAGNOSIS — Z23 Encounter for immunization: Secondary | ICD-10-CM

## 2016-02-12 DIAGNOSIS — F1020 Alcohol dependence, uncomplicated: Secondary | ICD-10-CM | POA: Diagnosis not present

## 2016-02-12 MED ORDER — LISINOPRIL-HYDROCHLOROTHIAZIDE 10-12.5 MG PO TABS: 1.0000 | ORAL_TABLET | Freq: Every day | ORAL | 1 refills | Status: DC

## 2016-02-12 MED ORDER — CHLORDIAZEPOXIDE HCL 25 MG PO CAPS: ORAL_CAPSULE | ORAL | 0 refills | Status: DC

## 2016-02-12 NOTE — Patient Instructions (Signed)
Thank you for coming in today. Get fasting labs soon.  Return next week to supervise librium taper.  Stop drinking when you start the librium.   Alcohol Withdrawal Alcohol withdrawal is a group of symptoms that can develop when a person who drinks heavily and regularly stops drinking or drinks less. CAUSES Heavy and regular drinking can cause chemicals that send signals from the brain to the body (neurotransmitters) to deactivate. Alcohol withdrawal develops when deactivated neurotransmitters reactivate because a person stops drinking or drinks less. RISK FACTORS The more a person drinks and the longer he or she drinks, the greater the risk of alcohol withdrawal. Severe withdrawal is more likely to develop in someone who:  Had severe alcohol withdrawal in the past.  Had a seizure during a previous episode of alcohol withdrawal.  Is elderly.  Is pregnant.  Has been abusing drugs.  Has other medical problems, including:  Infection.  Heart, lung, or liver disease.  Seizures.  Mental health problems. SYMPTOMS Symptoms of this condition can be mild to moderate, or they can be severe. Mild to moderate symptoms may include:  Fatigue.  Nightmares.  Trouble sleeping.  Depression.  Anxiety.  Inability to think clearly.  Mood swings.  Irritability.  Loss of appetite.  Nausea or vomiting.  Clammy skin.  Extreme sweating.  Rapid heartbeat.  Shakiness.  Uncontrollable shaking (tremor). Severe symptoms may include:  Fever.  Seizures.  Severeconfusion.  Feeling or seeing things that are not there (hallucinations). Symptoms usually begin within eight hours after a person stops drinking or drinks less. They can last for weeks. DIAGNOSIS Alcohol withdrawal is diagnosed with a medical history and physical exam. Sometimes, urine and blood tests are also done. TREATMENT Treatment may involve:  Monitoring blood pressure, pulse, and breathing.  Getting fluids  through an IV tube.  Medicine to reduce anxiety.  Medicine to prevent or control seizures.  Multivitamins and B vitamins.  Having a health care provider check on you daily. If symptoms are moderate to severe or if there is a risk of severe withdrawal, treatment may be done at a hospital or treatment center. HOME CARE INSTRUCTIONS  Take medicines and vitamin supplements only as directed by your health care provider.  Do not drink alcohol.  Have someone stay with you or be available if you need help.  Drink enough fluid to keep your urine clear or pale yellow.  Consider joining a 12-step program or another alcohol support group. SEEK MEDICAL CARE IF:  Your symptoms get worse or do not go away.  You cannot keep food or water in your stomach.  You are struggling with not drinking alcohol.  You cannot stop drinking alcohol. SEEK IMMEDIATE MEDICAL CARE IF:   You have an irregular heartbeat.  You have chest pain.  You have trouble breathing.  You have symptoms of severe withdrawal, such as:  A fever.  Seizures.  Severe confusion.  Hallucinations.   This information is not intended to replace advice given to you by your health care provider. Make sure you discuss any questions you have with your health care provider.   Document Released: 03/09/2005 Document Revised: 06/20/2014 Document Reviewed: 03/18/2014 Elsevier Interactive Patient Education [employer]

## 2016-02-12 NOTE — Progress Notes (Signed)
Richard Andrews is a 55 y.o. male who [name] Medicine today for establish care and discuss hypertension and alcohol abuse.  Hypertension: Previously doing quite well with lisinopril hydrochlorothiazide. He has run out of his medicine a few days ago. No chest pains palpitations or shortness of breath.  Alcohol use: Patient has a long history of alcohol abuse. Has had several quitting attempts had trouble maintaining. He currently drinks about a fifth of whiskey and a case of beer a week. He had a significant injury that occurred in June when he fell down some stairs while very intoxicated with an alcohol level of 385.  He's done well in the past with a Librium taper and is interested in a Librium taper to avoid withdrawals today. He notes that he has had delirium tremens with alcohol withdrawal from the past.    Past Medical History:  Diagnosis Date  . Allergy   . Distal radius fracture, left   . Hypertension    Past Surgical History:  Procedure Laterality Date  . OPEN REDUCTION INTERNAL FIXATION (ORIF) DISTAL RADIAL FRACTURE Left 01/28/2015   Procedure: OPEN REDUCTION INTERNAL FIXATION (ORIF) LEFT DISTAL RADIAL FRACTURE;  Surgeon: Tarry KosNaiping M Xu, MD;  Location: [facility];  Service: Orthopedics;  Laterality: Left;  . TONSILLECTOMY     Social History  Substance Use Topics  . Smoking status: Current Every Day Smoker    Packs/day: 1.00    Years: 10.00    Types: Cigarettes  . Smokeless tobacco: Current User    Types: Chew  . Alcohol use 12.0 oz/week    20 Cans of beer per week     Comment: daily   family history includes Heart disease in his mother; Hyperlipidemia in his mother.  ROS as above: No headache, visual changes, nausea, vomiting, diarrhea, constipation, dizziness, abdominal pain, skin rash, fevers, chills, night sweats, weight  loss, swollen lymph nodes, body aches, joint swelling, muscle aches, chest pain, shortness of breath, mood changes, visual or auditory hallucinations.    Medications: Current Outpatient Prescriptions  Medication Sig Dispense Refill  . aspirin EC 81 MG tablet Take 81 mg by mouth daily.    . chlordiazePOXIDE (LIBRIUM) 25 MG capsule 50mg  PO TID x 1D, then 25-50mg  PO BID X 1D, then 25-50mg  PO QD X 1D 10 capsule 0  . multivitamin (ONE-A-DAY MEN'S) TABS tablet Take 1 tablet by mouth daily. 30 tablet 0  . thiamine (VITAMIN B-1) 100 MG tablet Take 1 tablet (100 mg total) by mouth daily. 30 tablet 0  . lisinopril-hydrochlorothiazide (PRINZIDE,ZESTORETIC) 10-12.5 MG tablet Take 1 tablet by mouth daily. 90 tablet 1   No current facility-administered medications for this visit.    No Known Allergies   Exam:  BP (!) 168/89   Pulse 70   Wt 163 lb (73.9 kg)   BMI 22.73 kg/m  Gen: Well NAD HEENT: EOMI,  MMM Lungs: Normal work of breathing. CTABL Heart: RRR no MRG Abd: NABS, Soft. Nondistended, Nontender Exts: Brisk capillary refill, warm and well perfused.  Neuro: No tremor  No results found for this or any previous visit (from the past 24 hour(s)). No results found.    Assessment and Plan: 55 y.o. male with  1) hypertension: Start hydrochlorothiazide/lisinopril. Check basic labs. Recheck in the near future 2) alcohol abuse: Librium taper check B-12 and B1. Administer pneumococcal vaccine. Check next week   Orders Placed This Encounter  Procedures  . Pneumococcal polysaccharide vaccine 23-valent greater than or equal to 2yo subcutaneous/IM  . CBC  . Comprehensive metabolic panel    Order Specific Question:   Has the patient fasted?    Answer:   No  . Lipid panel    Order Specific Question:   Has the patient fasted?    Answer:   No  . Hemoglobin A1c  . VITAMIN D 25 Hydroxy (Vit-D Deficiency, Fractures)  . Hepatitis C antibody  . Hepatitis B surface antigen  . Hepatitis B  surface antibody  . Vitamin B12  . Vitamin B1    Discussed warning signs or symptoms. Please see discharge instructions. Patient expresses understanding.

## 2016-03-18 LAB — CBC
HEMATOCRIT: 42.6 % (ref 38.5–50.0)
HEMOGLOBIN: 15.2 g/dL (ref 13.2–17.1)
MCH: 36.2 pg — ABNORMAL HIGH (ref 27.0–33.0)
MCHC: 35.7 g/dL (ref 32.0–36.0)
MCV: 101.4 fL — AB (ref 80.0–100.0)
MPV: 10.7 fL (ref 7.5–12.5)
Platelets: 122 10*3/uL — ABNORMAL LOW (ref 140–400)
RBC: 4.2 MIL/uL (ref 4.20–5.80)
RDW: 12.7 % (ref 11.0–15.0)
WBC: 3.7 10*3/uL — ABNORMAL LOW (ref 3.8–10.8)

## 2016-03-18 LAB — HEMOGLOBIN A1C
Hgb A1c MFr Bld: 5.2 % (ref <5.7)
Mean Plasma Glucose: 103 mg/dL

## 2016-03-19 LAB — HEPATITIS C ANTIBODY: HCV AB: NEGATIVE

## 2016-03-19 LAB — COMPREHENSIVE METABOLIC PANEL
ALBUMIN: 4.4 g/dL (ref 3.6–5.1)
ALK PHOS: 50 U/L (ref 40–115)
ALT: 36 U/L (ref 9–46)
AST: 44 U/L — AB (ref 10–35)
BILIRUBIN TOTAL: 1.3 mg/dL — AB (ref 0.2–1.2)
BUN: 16 mg/dL (ref 7–25)
CALCIUM: 9.9 mg/dL (ref 8.6–10.3)
CO2: 26 mmol/L (ref 20–31)
Chloride: 97 mmol/L — ABNORMAL LOW (ref 98–110)
Creat: 1.05 mg/dL (ref 0.70–1.33)
Glucose, Bld: 111 mg/dL — ABNORMAL HIGH (ref 65–99)
POTASSIUM: 3.7 mmol/L (ref 3.5–5.3)
Sodium: 136 mmol/L (ref 135–146)
TOTAL PROTEIN: 7.9 g/dL (ref 6.1–8.1)

## 2016-03-19 LAB — LIPID PANEL
CHOL/HDL RATIO: 2.5 ratio (ref <=5.0)
CHOLESTEROL: 176 mg/dL (ref 125–200)
HDL: 70 mg/dL (ref >=40)
LDL Cholesterol: 94 mg/dL (ref <130)
TRIGLYCERIDES: 61 mg/dL (ref <150)
VLDL: 12 mg/dL (ref <30)

## 2016-03-19 LAB — VITAMIN B12: Vitamin B-12: 447 pg/mL (ref 200–1100)

## 2016-03-19 LAB — VITAMIN D 25 HYDROXY (VIT D DEFICIENCY, FRACTURES): Vit D, 25-Hydroxy: 45 ng/mL (ref 30–100)

## 2016-03-19 LAB — HEPATITIS B SURFACE ANTIBODY, QUANTITATIVE

## 2016-03-19 LAB — HEPATITIS B SURFACE ANTIGEN: HEP B S AG: NEGATIVE

## 2016-03-21 ENCOUNTER — Encounter: Payer: Self-pay | Admitting: Family Medicine

## 2016-03-21 DIAGNOSIS — D7589 Other specified diseases of blood and blood-forming organs: Secondary | ICD-10-CM | POA: Insufficient documentation

## 2016-03-21 DIAGNOSIS — R748 Abnormal levels of other serum enzymes: Secondary | ICD-10-CM | POA: Insufficient documentation

## 2016-03-21 DIAGNOSIS — E889 Metabolic disorder, unspecified: Secondary | ICD-10-CM | POA: Insufficient documentation

## 2016-03-21 DIAGNOSIS — R740 Nonspecific elevation of levels of transaminase and lactic acid dehydrogenase [LDH]: Secondary | ICD-10-CM

## 2016-03-21 DIAGNOSIS — R7401 Elevation of levels of liver transaminase levels: Secondary | ICD-10-CM | POA: Insufficient documentation

## 2016-03-21 LAB — VITAMIN B1: VITAMIN B1 (THIAMINE): 51 nmol/L — AB (ref 8–30)

## 2016-03-22 ENCOUNTER — Encounter: Payer: Self-pay | Admitting: Family Medicine

## 2016-03-31 ENCOUNTER — Ambulatory Visit (INDEPENDENT_AMBULATORY_CARE_PROVIDER_SITE_OTHER): Payer: PRIVATE HEALTH INSURANCE | Admitting: Family Medicine

## 2016-03-31 VITALS — BP 140/73 | HR 77 | Wt 160.0 lb

## 2016-03-31 DIAGNOSIS — R740 Nonspecific elevation of levels of transaminase and lactic acid dehydrogenase [LDH]: Secondary | ICD-10-CM | POA: Diagnosis not present

## 2016-03-31 DIAGNOSIS — F1020 Alcohol dependence, uncomplicated: Secondary | ICD-10-CM

## 2016-03-31 DIAGNOSIS — I10 Essential (primary) hypertension: Secondary | ICD-10-CM | POA: Diagnosis not present

## 2016-03-31 DIAGNOSIS — R7401 Elevation of levels of liver transaminase levels: Secondary | ICD-10-CM

## 2016-03-31 MED ORDER — CHLORDIAZEPOXIDE HCL 25 MG PO CAPS: ORAL_CAPSULE | ORAL | 0 refills | Status: DC

## 2016-03-31 NOTE — Progress Notes (Signed)
Richard DuralLarry F Andrews is a 55 y.o. male who presents [facility]: Primary [facility] Medicine today for hypertension and alcohol abuse.  Hypertension taking medication list below. Doing well no chest pain palpitations shortness of breath.  Alcohol abuse: Patient was given a Librium taper at the last visit. He successfully was able to stop drinking however he restarted drinking alcohol heavily shortly thereafter. He continues to use alcohol daily. He would like to try quitting again. He has a plan to keep himself busy and avoid alcohol temptations. He has used group therapy in the past and hates it does not want to do AA or detox.  Transaminitis: At the last lab check patient was found to have mild transaminitis. He notes occasional intermittent abdominal pain located in the right upper quadrant.   Past Medical History:  Diagnosis Date  . Allergy   . Distal radius fracture, left   . Hypertension    Past Surgical History:  Procedure Laterality Date  . OPEN REDUCTION INTERNAL FIXATION (ORIF) DISTAL RADIAL FRACTURE Left 01/28/2015   Procedure: OPEN REDUCTION INTERNAL FIXATION (ORIF) LEFT DISTAL RADIAL FRACTURE;  Surgeon: Tarry KosNaiping M Xu, MD;  Location:  SUR[facility]ERY CENTER;  Service: Orthopedics;  Laterality: Left;  . TONSILLECTOMY     Social History  Substance Use Topics  . Smoking status: Current Every Day Smoker    Packs/day: 1.00    Years: 10.00    Types: Cigarettes  . Smokeless tobacco: Current User    Types: Chew  . Alcohol use 12.0 oz/week    20 Cans of beer per week     Comment: daily   family history includes Heart disease in his mother; Hyperlipidemia in his mother.  ROS as above:  Medications: Current Outpatient Prescriptions  Medication Sig Dispense Refill  . aspirin EC 81 MG tablet Take 81 mg by mouth daily.    . chlordiazePOXIDE (LIBRIUM) 25 MG capsule 50mg  PO TID x 1D, then  25-50mg  PO BID X 1D, then 25-50mg  PO QD X 1D 10 capsule 0  . lisinopril-hydrochlorothiazide (PRINZIDE,ZESTORETIC) 10-12.5 MG tablet Take 1 tablet by mouth daily. 90 tablet 1  . multivitamin (ONE-A-DAY MEN'S) TABS tablet Take 1 tablet by mouth daily. 30 tablet 0  . thiamine (VITAMIN B-1) 100 MG tablet Take 1 tablet (100 mg total) by mouth daily. 30 tablet 0   No current facility-administered medications for this visit.    No Known Allergies  Health Maintenance Health Maintenance  Topic Date Due  . HIV Screening  09[birth year]/1974  . COLONOSCOPY  09/07/2009  . INFLUENZA VACCINE  02/11/2017 (Originally 01/12/2016)  . TETANUS/TDAP  05/03/2021  . Hepatitis C Screening  Completed     Exam:  BP 140/73   Pulse 77   Wt 160 lb (72.6 kg)   BMI 22.32 kg/m  Gen: Well NAD HEENT: EOMI,  MMM Lungs: Normal work of breathing. CTABL Heart: RRR no MRG Abd: NABS, Soft. Nondistended, Nontender Exts: Brisk capillary refill, warm and well perfused.  Neuro: No tremor   No results found for this or any previous visit (from the past 72 hour(s)). No results found.    Assessment and Plan: 55 y.o. male with  Hypertension: Doing well. Continue current medication management. Check metabolic panel.  Transaminitis: Mild in the past. Obtain abdominal ultrasound to assess for cirrhosis.  Alcohol abuse: Retry Librium taper. Return in the near future.   Orders Placed This Encounter  Procedures  . US Abdomen Complete  Standing Status:   Future    Standing Expiration Date:   05/31/2017    Order Specific Question:   Reason for Exam (SYMPTOM  OR DIAGNOSIS REQUIRED)    Answer:   eval liver enzyme elevation. Pt heavy alcohol use.    Order Specific Question:   Preferred imaging location?    Answer:   [facility]  . COMPLETE METABOLIC PANEL WITH GFR    Discussed warning signs or symptoms. Please see discharge instructions. Patient expresses understanding.

## 2016-03-31 NOTE — Patient Instructions (Signed)
Thank you for coming in today. Get labs today.  Get ultrasound soon.  Good luck with alcohol.  I am here to help you in any way that I can.

## 2016-04-01 LAB — COMPLETE METABOLIC PANEL WITH GFR
ALT: 31 U/L (ref 9–46)
AST: 43 U/L — AB (ref 10–35)
Albumin: 4.6 g/dL (ref 3.6–5.1)
Alkaline Phosphatase: 65 U/L (ref 40–115)
BUN: 13 mg/dL (ref 7–25)
CHLORIDE: 98 mmol/L (ref 98–110)
CO2: 28 mmol/L (ref 20–31)
CREATININE: 0.99 mg/dL (ref 0.70–1.33)
Calcium: 9.6 mg/dL (ref 8.6–10.3)
GFR, Est African American: >89 mL/min (ref >=60)
GFR, Est Non African American: 85 mL/min (ref >=60)
Glucose, Bld: 98 mg/dL (ref 65–99)
Potassium: 3.4 mmol/L — ABNORMAL LOW (ref 3.5–5.3)
Sodium: 138 mmol/L (ref 135–146)
Total Bilirubin: 0.8 mg/dL (ref 0.2–1.2)
Total Protein: 8.1 g/dL (ref 6.1–8.1)

## 2016-07-14 ENCOUNTER — Telehealth: Payer: Self-pay | Admitting: Family Medicine

## 2016-07-14 DIAGNOSIS — R740 Nonspecific elevation of levels of transaminase and lactic acid dehydrogenase [LDH]: Principal | ICD-10-CM

## 2016-07-14 DIAGNOSIS — R7401 Elevation of levels of liver transaminase levels: Secondary | ICD-10-CM

## 2016-07-14 NOTE — Telephone Encounter (Signed)
Liver us r[place]oreae ordered. Follow up after [place]

## 2016-07-19 ENCOUNTER — Other Ambulatory Visit: Payer: PRIVATE HEALTH INSURANCE

## 2016-07-20 ENCOUNTER — Other Ambulatory Visit: Payer: PRIVATE HEALTH INSURANCE

## 2016-07-26 ENCOUNTER — Other Ambulatory Visit: Payer: PRIVATE HEALTH INSURANCE

## 2016-07-28 ENCOUNTER — Ambulatory Visit (INDEPENDENT_AMBULATORY_CARE_PROVIDER_SITE_OTHER): Payer: PRIVATE HEALTH INSURANCE

## 2016-07-28 DIAGNOSIS — R740 Nonspecific elevation of levels of transaminase and lactic acid dehydrogenase [LDH]: Secondary | ICD-10-CM | POA: Diagnosis not present

## 2016-07-28 DIAGNOSIS — R7401 Elevation of levels of liver transaminase levels: Secondary | ICD-10-CM

## 2016-08-03 ENCOUNTER — Other Ambulatory Visit: Payer: Self-pay | Admitting: Family Medicine

## 2016-08-03 DIAGNOSIS — I10 Essential (primary) hypertension: Secondary | ICD-10-CM

## 2016-12-20 ENCOUNTER — Encounter: Payer: Self-pay | Admitting: Family Medicine

## 2016-12-20 ENCOUNTER — Ambulatory Visit (INDEPENDENT_AMBULATORY_CARE_PROVIDER_SITE_OTHER): Payer: PRIVATE HEALTH INSURANCE | Admitting: Family Medicine

## 2016-12-20 ENCOUNTER — Telehealth: Payer: Self-pay | Admitting: *Deleted

## 2016-12-20 VITALS — BP 122/85 | Temp 98.7°F | Wt 150.0 lb

## 2016-12-20 DIAGNOSIS — F1020 Alcohol dependence, uncomplicated: Secondary | ICD-10-CM | POA: Diagnosis not present

## 2016-12-20 DIAGNOSIS — D7589 Other specified diseases of blood and blood-forming organs: Secondary | ICD-10-CM

## 2016-12-20 DIAGNOSIS — R740 Nonspecific elevation of levels of transaminase and lactic acid dehydrogenase [LDH]: Secondary | ICD-10-CM

## 2016-12-20 DIAGNOSIS — Z7409 Other reduced mobility: Secondary | ICD-10-CM | POA: Diagnosis not present

## 2016-12-20 DIAGNOSIS — R251 Tremor, unspecified: Secondary | ICD-10-CM

## 2016-12-20 DIAGNOSIS — R7401 Elevation of levels of liver transaminase levels: Secondary | ICD-10-CM

## 2016-12-20 LAB — CBC
HEMATOCRIT: 40 % (ref 38.5–50.0)
HEMOGLOBIN: 14 g/dL (ref 13.2–17.1)
MCH: 36 pg — ABNORMAL HIGH (ref 27.0–33.0)
MCHC: 35 g/dL (ref 32.0–36.0)
MCV: 102.8 fL — ABNORMAL HIGH (ref 80.0–100.0)
MPV: 10.2 fL (ref 7.5–12.5)
Platelets: 79 10*3/uL — ABNORMAL LOW (ref 140–400)
RBC: 3.89 MIL/uL — ABNORMAL LOW (ref 4.20–5.80)
RDW: 12.4 % (ref 11.0–15.0)
WBC: 3.1 10*3/uL — AB (ref 3.8–10.8)

## 2016-12-20 LAB — COMPLETE METABOLIC PANEL WITH GFR
ALBUMIN: 4.8 g/dL (ref 3.6–5.1)
ALK PHOS: 63 U/L (ref 40–115)
ALT: 65 U/L — AB (ref 9–46)
AST: 75 U/L — AB (ref 10–35)
BUN: 20 mg/dL (ref 7–25)
CALCIUM: 9.7 mg/dL (ref 8.6–10.3)
CO2: 26 mmol/L (ref 20–31)
CREATININE: 0.9 mg/dL (ref 0.70–1.33)
Chloride: 97 mmol/L — ABNORMAL LOW (ref 98–110)
GFR, Est African American: >89 mL/min (ref >=60)
GFR, Est Non African American: >89 mL/min (ref >=60)
Glucose, Bld: 132 mg/dL — ABNORMAL HIGH (ref 65–99)
POTASSIUM: 3.3 mmol/L — AB (ref 3.5–5.3)
Sodium: 136 mmol/L (ref 135–146)
TOTAL PROTEIN: 8.2 g/dL — AB (ref 6.1–8.1)
Total Bilirubin: 1.5 mg/dL — ABNORMAL HIGH (ref 0.2–1.2)

## 2016-12-20 LAB — AMMONIA: Ammonia: 55 umol/L — ABNORMAL HIGH (ref <=47)

## 2016-12-20 LAB — BILIRUBIN, FRACTIONATED(TOT/DIR/INDIR)
Bilirubin, Direct: 0.4 mg/dL — ABNORMAL HIGH (ref <=0.2)
Indirect Bilirubin: 1 mg/dL (ref 0.2–1.2)
Total Bilirubin: 1.4 mg/dL — ABNORMAL HIGH (ref 0.2–1.2)

## 2016-12-20 LAB — LIPASE: LIPASE: 80 U/L — AB (ref 7–60)

## 2016-12-20 MED ORDER — VITAMIN B-1 100 MG PO TABS: 100.0000 mg | ORAL_TABLET | Freq: Every day | ORAL | 0 refills | Status: DC

## 2016-12-20 MED ORDER — CHLORDIAZEPOXIDE HCL 25 MG PO CAPS: ORAL_CAPSULE | ORAL | 0 refills | Status: DC

## 2016-12-20 MED ORDER — FOLIC ACID 1 MG PO TABS: 1.0000 mg | ORAL_TABLET | Freq: Every day | ORAL | 1 refills | Status: DC

## 2016-12-20 NOTE — Progress Notes (Signed)
Richard DuralLarry F Andrews is a 56 y.o. male who presents [facility]: Primary [facility] Medicine today for tremor. Patient developed tremor and impaired gait yesterday. He has a history of heavy alcohol use and last drank alcohol 2 days ago. He notes the tremor and impaired gait is not typical for him with drinking binges. He denies any one-sided neurologic problems or slurred speech. No fevers or chills or no injuries. He is not interested in quitting drinking especially.   Past Medical History:  Diagnosis Date  . Allergy   . Distal radius fracture, left   . Hypertension    Past Surgical History:  Procedure Laterality Date  . OPEN REDUCTION INTERNAL FIXATION (ORIF) DISTAL RADIAL FRACTURE Left 01/28/2015   Procedure: OPEN REDUCTION INTERNAL FIXATION (ORIF) LEFT DISTAL RADIAL FRACTURE;  Surgeon: Tarry KosNaiping M Xu, MD;  Location: [facility];  Service: Orthopedics;  Laterality: Left;  . TONSILLECTOMY     Social History  Substance Use Topics  . Smoking status: Current Every Day Smoker    Packs/day: 1.00    Years: 10.00    Types: Cigarettes  . Smokeless tobacco: Current User    Types: Chew  . Alcohol use 12.0 oz/week    20 Cans of beer per week     Comment: daily   family history includes Heart disease in his mother; Hyperlipidemia in his mother.  ROS as above:  Medications: Current Outpatient Prescriptions  Medication Sig Dispense Refill  . lisinopril-hydrochlorothiazide (PRINZIDE,ZESTORETIC) 10-12.5 MG tablet TAKE 1 TABLET BY MOUTH DAILY. 90 tablet 1  . aspirin EC 81 MG tablet Take 81 mg by mouth daily.    . chlordiazePOXIDE (LIBRIUM) 25 MG capsule 50mg  PO TID x 1D, then 25-50mg  PO BID X 1D, then 25-50mg  PO QD X 1D 10 capsule 0  . folic acid (FOLVITE) 1 MG tablet Take 1 tablet (1 mg total) by mouth daily. 30 tablet 1  . multivitamin (ONE-A-DAY MEN'S) TABS tablet Take 1 tablet by mouth  daily. (Patient not taking: Reported on 12/20/2016) 30 tablet 0  . thiamine (VITAMIN B-1) 100 MG tablet Take 1 tablet (100 mg total) by mouth daily. 30 tablet 0   No current facility-administered medications for this visit.    No Known Allergies  Health Maintenance Health Maintenance  Topic Date Due  . HIV Screening  09/[birth year]/1974  . COLONOSCOPY  09/07/2009  . INFLUENZA VACCINE  02/11/2017 (Originally 01/11/2017)  . TETANUS/TDAP  05/03/2021  . Hepatitis C Screening  Completed     Exam:  BP 122/85   Temp 98.7 F (37.1 C) (Oral)   Wt 150 lb (68 kg)   SpO2 99%   BMI 20.92 kg/m  Gen: Well NAD HEENT: EOMI,  MMM No scleral icterus Lungs: Normal work of breathing. CTABL Heart: RRR no MRG Abd: NABS, Soft. Nondistended, Nontender Exts: Brisk capillary refill, warm and well perfused.  Neuro: Alert and oriented normal speech and affect. Bilateral fine tremor is present with no asterixis. Cerebellum testing including rapid alternating movements and shin rub are normal bilaterally. Patient has impaired balance and is unable to complete tandem gait. No psychomotor agitation.  Depression screen PHQ 2/9 12/20/2016  Decreased Interest 2  Down, Depressed, Hopeless 0  PHQ - 2 Score 2  Altered sleeping 3  Tired, decreased energy 3  Change in appetite 3  Feeling bad or failure about yourself  3  Trouble concentrating 0  Moving slowly or fidgety/restless 2  Suicidal thoughts  0  PHQ-9 Score 16   GAD 7 : Generalized Anxiety Score 12/20/2016  Nervous, Anxious, on Edge 2  Control/stop worrying 3  Worry too much - different things 3  Trouble relaxing 3  Restless 2  Easily annoyed or irritable 2  Afraid - awful might happen 1  Total GAD 7 Score 16       Results for orders placed or performed in visit on 12/20/16 (from the past 72 hour(s))  CBC     Status: Abnormal   Collection Time: 12/20/16  9:34 AM  Result Value Ref Range   WBC 3.1 (L) 3.8 - 10.8 K/uL   RBC 3.89 (L) 4.20 - 5.80  MIL/uL   Hemoglobin 14.0 13.2 - 17.1 g/dL   HCT 09.8 11.9 - 14.7 %   MCV 102.8 (H) 80.0 - 100.0 fL   MCH 36.0 (H) 27.0 - 33.0 pg   MCHC 35.0 32.0 - 36.0 g/dL   RDW 82.9 56.2 - 13.0 %   Platelets 79 (L) 140 - 400 K/uL    Comment: Platelet count confirmed on smear   MPV 10.2 7.5 - 12.5 fL  COMPLETE METABOLIC PANEL WITH GFR     Status: None (Preliminary result)   Collection Time: 12/20/16  9:34 AM  Result Value Ref Range   Sodium  135 - 146 mmol/L   Potassium  3.5 - 5.3 mmol/L   Chloride  98 - 110 mmol/L   CO2  20 - 31 mmol/L   Glucose, Bld  65 - 99 mg/dL   BUN  7 - 25 mg/dL   Creat  8.65 - 7.84 mg/dL   Total Bilirubin  0.2 - 1.2 mg/dL   Alkaline Phosphatase  40 - 115 U/L   AST  10 - 35 U/L   ALT  9 - 46 U/L   Total Protein  6.1 - 8.1 g/dL   Albumin  3.6 - 5.1 g/dL   Calcium  8.6 - 69.6 mg/dL   GFR, Est African American  >=60 mL/min   GFR, Est Non African American  >=60 mL/min  Lipase     Status: None (Preliminary result)   Collection Time: 12/20/16  9:34 AM  Result Value Ref Range   Lipase  7 - 60 U/L  AMMONIA     Status: Abnormal   Collection Time: 12/20/16  9:34 AM  Result Value Ref Range   Ammonia 55 (H) < OR = 47 umol/L   No results found.    Assessment and Plan: 56 y.o. male with New tremor and impaired balance. I suspect this is probably alcohol withdrawal however his family notes this is not typical for him. Plan for a rapid laboratory assessment including ammonia to assess for liver failure or other potential causes of toxic encephalopathy. We'll start a Librium taper now and recheck tomorrow. Labs will be obtained stat. Additionally we'll start the plan for MRI of the brain to be done in the near future. We may cancel this lab if it becomes obvious that he has toxic metabolic cause. Carefully reviewed precautions. Additionally we'll prescribe thiamine and folic acid.    Orders Placed This Encounter  Procedures  . MR Brain W Wo Contrast    Standing Status:    Future    Standing Expiration Date:   02/20/2018    Order Specific Question:   If indicated for the ordered procedure, I authorize the administration of contrast media per Radiology protocol    Answer:   Yes  Order Specific Question:   Reason for Exam (SYMPTOM  OR DIAGNOSIS REQUIRED)    Answer:   eval new tremor and balance issues. Concern CVA?    Order Specific Question:   What is the patient's sedation requirement?    Answer:   No Sedation    Order Specific Question:   Does the patient have a pacemaker or implanted devices?    Answer:   No    Order Specific Question:   Preferred imaging location?    Answer:   [facility] (table limit-350lbs)    Order Specific Question:   Radiology Contrast Protocol - do NOT remove file path    Answer:   [email or web address]  . CBC  . COMPLETE METABOLIC PANEL WITH GFR  . Lipase  . Ammonia  . Bilirubin, fractionated (tot/dir/indir)  . AMMONIA   Meds ordered this encounter  Medications  . chlordiazePOXIDE (LIBRIUM) 25 MG capsule    Sig: 50mg  PO TID x 1D, then 25-50mg  PO BID X 1D, then 25-50mg  PO QD X 1D    Dispense:  10 capsule    Refill:  0  . thiamine (VITAMIN B-1) 100 MG tablet    Sig: Take 1 tablet (100 mg total) by mouth daily.    Dispense:  30 tablet    Refill:  0  . folic acid (FOLVITE) 1 MG tablet    Sig: Take 1 tablet (1 mg total) by mouth daily.    Dispense:  30 tablet    Refill:  1     Discussed warning signs or symptoms. Please see discharge instructions. Patient expresses understanding.  I spent 40 minutes with this patient, greater than 50% was face-to-face time counseling regarding the above diagnosis.

## 2016-12-20 NOTE — Patient Instructions (Signed)
Thank you for coming in today. Get labs today.  I will call you with results today.  My number if you have an emergency is [phone]  [facility].    Alcohol Withdrawal Alcohol withdrawal is a group of symptoms that can develop when a person who drinks heavily and regularly stops drinking or drinks less. What are the causes? Heavy and regular drinking can cause chemicals that send signals from the brain to the body (neurotransmitters) to deactivate. Alcohol withdrawal develops when deactivated neurotransmitters reactivate because a person stops drinking or drinks less. What increases the risk? The more a person drinks and the longer he or she drinks, the greater the risk of alcohol withdrawal. Severe withdrawal is more likely to develop in someone who:  Had severe alcohol withdrawal in the past.  Had a seizure during a previous episode of alcohol withdrawal.  Is elderly.  Is pregnant.  Has been abusing drugs.  Has other medical problems, including: ? Infection. ? Heart, lung, or liver disease. ? Seizures. ? Mental health problems.  What are the signs or symptoms? Symptoms of this condition can be mild to moderate, or they can be severe. Mild to moderate symptoms may include:  Fatigue.  Nightmares.  Trouble sleeping.  Depression.  Anxiety.  Inability to think clearly.  Mood swings.  Irritability.  Loss of appetite.  Nausea or vomiting.  Clammy skin.  Extreme sweating.  Rapid heartbeat.  Shakiness.  Uncontrollable shaking (tremor).  Severe symptoms may include:  Fever.  Seizures.  Severeconfusion.  Feeling or seeing things that are not there (hallucinations).  Symptoms usually begin within eight hours after a person stops drinking or drinks less. They can last for weeks. How is this diagnosed? Alcohol withdrawal is diagnosed with a medical history and physical exam. Sometimes, urine and blood tests are also done. How is this  treated? Treatment may involve:  Monitoring blood pressure, pulse, and breathing.  Getting fluids through an IV tube.  Medicine to reduce anxiety.  Medicine to prevent or control seizures.  Multivitamins and B vitamins.  Having a health care provider check on you daily.  If symptoms are moderate to severe or if there is a risk of severe withdrawal, treatment may be done at a hospital or treatment center. Follow these instructions at home:  Take medicines and vitamin supplements only as directed by your health care provider.  Do not drink alcohol.  Have someone stay with you or be available if you need help.  Drink enough fluid to keep your urine clear or pale yellow.  Consider joining a 12-step program or another alcohol support group. Contact a health care provider if:  Your symptoms get worse or do not go away.  You cannot keep food or water in your stomach.  You are struggling with not drinking alcohol.  You cannot stop drinking alcohol. Get help right away if:  You have an irregular heartbeat.  You have chest pain.  You have trouble breathing.  You have symptoms of severe withdrawal, such as: ? A fever. ? Seizures. ? Severe confusion. ? Hallucinations. This information is not intended to replace advice given to you by your health care provider. Make sure you discuss any questions you have with your health care provider. Document Released: 03/09/2005 Document Revised: 10/07/2015 Document Reviewed: 03/18/2014 Elsevier Interactive Patient Education  [employer]2018 [employer]

## 2016-12-20 NOTE — Telephone Encounter (Signed)
Clinical notes will need to be sent to obtain possible PA.Loralee PacasBarkley, [place]

## 2016-12-21 ENCOUNTER — Encounter: Payer: Self-pay | Admitting: Family Medicine

## 2016-12-21 ENCOUNTER — Ambulatory Visit (INDEPENDENT_AMBULATORY_CARE_PROVIDER_SITE_OTHER): Payer: PRIVATE HEALTH INSURANCE | Admitting: Family Medicine

## 2016-12-21 VITALS — BP 115/71 | HR 73 | Wt 153.0 lb

## 2016-12-21 DIAGNOSIS — D696 Thrombocytopenia, unspecified: Secondary | ICD-10-CM | POA: Insufficient documentation

## 2016-12-21 DIAGNOSIS — R7989 Other specified abnormal findings of blood chemistry: Secondary | ICD-10-CM

## 2016-12-21 DIAGNOSIS — F10230 Alcohol dependence with withdrawal, uncomplicated: Secondary | ICD-10-CM | POA: Diagnosis not present

## 2016-12-21 DIAGNOSIS — R740 Nonspecific elevation of levels of transaminase and lactic acid dehydrogenase [LDH]: Secondary | ICD-10-CM

## 2016-12-21 DIAGNOSIS — R7401 Elevation of levels of liver transaminase levels: Secondary | ICD-10-CM

## 2016-12-21 MED ORDER — LACTULOSE ENCEPHALOPATHY 10 GM/15ML PO SOLN: 10.0000 g | Freq: Two times a day (BID) | ORAL | 12 refills | Status: DC

## 2016-12-21 NOTE — Progress Notes (Signed)
Richard Andrews is a 56 y.o. male who presents to [name] today for follow-up tremor. Patient notes the tremor has improved since the last visit. He continues the Librium taper and has not had alcohol for 3 days now. He's feeling much better. He does note some episodes of vertigo or room spinning with head motion however. He is not quite ready to quit drinking.   Past Medical History:  Diagnosis Date  . Allergy   . Distal radius fracture, left   . Hypertension    Past Surgical History:  Procedure Laterality Date  . OPEN REDUCTION INTERNAL FIXATION (ORIF) DISTAL RADIAL FRACTURE Left 01/28/2015   Procedure: OPEN REDUCTION INTERNAL FIXATION (ORIF) LEFT DISTAL RADIAL FRACTURE;  Surgeon: Tarry Kos, MD;  Location: [facility];  Service: Orthopedics;  Laterality: Left;  . TONSILLECTOMY     Social History  Substance Use Topics  . Smoking status: Current Every Day Smoker    Packs/day: 1.00    Years: 10.00    Types: Cigarettes  . Smokeless tobacco: Current User    Types: Chew  . Alcohol use 12.0 oz/week    20 Cans of beer per week     Comment: daily   family history includes Heart disease in his mother; Hyperlipidemia in his mother.  ROS as above:  Medications: Current Outpatient Prescriptions  Medication Sig Dispense Refill  . aspirin EC 81 MG tablet Take 81 mg by mouth daily.    . chlordiazePOXIDE (LIBRIUM) 25 MG capsule 50mg  PO TID x 1D, then 25-50mg  PO BID X 1D, then 25-50mg  PO QD X 1D 10 capsule 0  . folic acid (FOLVITE) 1 MG tablet Take 1 tablet (1 mg total) by mouth daily. 30 tablet 1  . lisinopril-hydrochlorothiazide (PRINZIDE,ZESTORETIC) 10-12.5 MG tablet TAKE 1 TABLET BY MOUTH DAILY. 90 tablet 1  . multivitamin (ONE-A-DAY MEN'S) TABS tablet Take 1 tablet by mouth daily. 30 tablet 0  . thiamine (VITAMIN B-1) 100 MG tablet Take 1 tablet  (100 mg total) by mouth daily. 30 tablet 0  . lactulose, encephalopathy, (CHRONULAC) 10 GM/15ML SOLN Take 15 mLs (10 g total) by mouth 2 (two) times daily. 2700 mL 12   No current facility-administered medications for this visit.    No Known Allergies  Health Maintenance Health Maintenance  Topic Date Due  . HIV Screening  09/[birth year]/1974  . COLONOSCOPY  09/07/2009  . INFLUENZA VACCINE  02/11/2017 (Originally 01/11/2017)  . TETANUS/TDAP  05/03/2021  . Hepatitis C Screening  Completed     Exam:  BP 115/71   Pulse 73   Wt 153 lb (69.4 kg)   SpO2 100%   BMI 21.34 kg/m  Gen: Well NAD HEENT: EOMI,  MMM Lungs: Normal work of breathing. CTABL Heart: RRR no MRG Abd: NABS, Soft. Nondistended, Nontender Exts: Brisk capillary refill, warm and well perfused.  Neuro: Significantly reduced tremor. Negative Dix-Hallpike test bilaterally Continued mild impaired tandem gait.   Results for orders placed or performed in visit on 12/20/16 (from the past 72 hour(s))  Bilirubin, fractionated (tot/dir/indir)     Status: Abnormal   Collection Time: 12/20/16  9:31 AM  Result Value Ref Range   Total Bilirubin 1.4 (H) 0.2 - 1.2 mg/dL   Bilirubin, Direct 0.4 (H) <=0.2 mg/dL   Indirect Bilirubin 1.0 0.2 - 1.2 mg/dL  CBC     Status: Abnormal   Collection Time: 12/20/16  9:34 AM  Result Value  Ref Range   WBC 3.1 (L) 3.8 - 10.8 K/uL   RBC 3.89 (L) 4.20 - 5.80 MIL/uL   Hemoglobin 14.0 13.2 - 17.1 g/dL   HCT 04.540.0 40.938.5 - 81.150.0 %   MCV 102.8 (H) 80.0 - 100.0 fL   MCH 36.0 (H) 27.0 - 33.0 pg   MCHC 35.0 32.0 - 36.0 g/dL   RDW 91.412.4 78.211.0 - 95.615.0 %   Platelets 79 (L) 140 - 400 K/uL    Comment: Platelet count confirmed on smear   MPV 10.2 7.5 - 12.5 fL  COMPLETE METABOLIC PANEL WITH GFR     Status: Abnormal   Collection Time: 12/20/16  9:34 AM  Result Value Ref Range   Sodium 136 135 - 146 mmol/L   Potassium 3.3 (L) 3.5 - 5.3 mmol/L   Chloride 97 (L) 98 - 110 mmol/L   CO2 26 20 - 31 mmol/L   Glucose,  Bld 132 (H) 65 - 99 mg/dL   BUN 20 7 - 25 mg/dL   Creat 2.130.90 0.860.70 - 5.781.33 mg/dL    Comment:   For patients > or = 57 years of age: The upper reference limit for Creatinine is approximately 13% higher for people identified as African-American.      Total Bilirubin 1.5 (H) 0.2 - 1.2 mg/dL   Alkaline Phosphatase 63 40 - 115 U/L   AST 75 (H) 10 - 35 U/L   ALT 65 (H) 9 - 46 U/L   Total Protein 8.2 (H) 6.1 - 8.1 g/dL   Albumin 4.8 3.6 - 5.1 g/dL   Calcium 9.7 8.6 - 46.910.3 mg/dL   GFR, Est African American >89 >=60 mL/min   GFR, Est Non African American >89 >=60 mL/min  Lipase     Status: Abnormal   Collection Time: 12/20/16  9:34 AM  Result Value Ref Range   Lipase 80 (H) 7 - 60 U/L  AMMONIA     Status: Abnormal   Collection Time: 12/20/16  9:34 AM  Result Value Ref Range   Ammonia 55 (H) < OR = 47 umol/L   No results found.    Assessment and Plan: 56 y.o. male with Tremor likely due to alcohol withdrawal. Plan to continue Librium taper. Watchful waiting recheck in one week.  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 Right now labs are quite bad and I suspect as his symptoms is improving his labs will be improving as well. Plan to recheck ammonia along with other labs in 2 days. I have prescribed lactulose which patient will start taking if ammonia levels or not improving.  Thrombocytopenia: Patient has mild thrombocytopenia seen on labs yesterday. I suspect patient may be developing cirrhosis although ultrasound from February of this year did not show cirrhosis. We'll recheck labs in the near future. We'll also check INR.  Alcohol abuse: Patient has alcohol addiction and abuse. He is now suffering quite bad symptoms. I'm concerned he is developing liver failure as discussed above. I had a frank discussion that he will likely die in the next several years if he is not able to quit drinking. He is not sure what he wants to do but will think about  alcohol abstinence.  Orders Placed This Encounter  Procedures  . CBC  . COMPLETE METABOLIC PANEL WITH GFR  . Ammonia  . INR/PT   Meds ordered this encounter  Medications  . lactulose, encephalopathy, (CHRONULAC) 10 GM/15ML SOLN    Sig: Take 15 mLs (10 g  total) by mouth 2 (two) times daily.    Dispense:  2700 mL    Refill:  12     Discussed warning signs or symptoms. Please see discharge instructions. Patient expresses understanding.

## 2016-12-21 NOTE — Patient Instructions (Signed)
Thank you for coming in today. Get labs Friday.  Recheck next week.  I will let you know if you need to take the lactulose.  Consider quitting alcohol.  Let me know what you want to do.   Ammonia Test Why am I having this test? Ammonia testing is used to help diagnose and monitor severe liver diseases. It is also used to diagnose and monitor a brain disorder that can develop in individuals who have liver disease (hepatic encephalopathy). Ammonia levels can rise when the liver and kidneys are not working well enough to get rid of urea. A buildup of ammonia in the body can cause mental and neurological changes that can lead to confusion, disorientation, sleepiness, and eventually coma and even death. Infants and children with increased ammonia levels may vomit often, be irritable, and be increasingly lethargic. Without treatment they may experience seizures and breathing difficulty and go into a coma and die. What kind of sample is taken? A blood sample is needed for this test. It is usually collected by inserting a needle into a vein. How do I prepare for this test? Follow instructions from your health care provider or your child's health care provider about avoiding these before the test:  Exercise.  Smoking cigarettes.  Certain medicines.  What are the reference ranges? Reference ranges are considered healthy ranges established after testing a large group of healthy people. Reference ranges may vary among different people, labs, and hospitals. It is your responsibility to obtain the test results. Ask the lab or department performing the test when and how you will get the results. What do the results mean? Increased levels of ammonia may mean that you have or your child has:  Liver disease.  Gastrointestinal (GI) bleeding or GI obstruction.  Severe heart failure.  Hemolytic disease of newborn.  Hepatic encephalopathy.  A genetic metabolic disorder.  Decreased levels of ammonia  may mean that you have or your child has:  High blood pressure (hypertension).  A genetic metabolic syndrome.  Talk with your health care provider to discuss the results, treatment options, and if necessary, the need for more tests. Talk with your health care provider if you have any questions about your results. Talk with your health care provider to discuss your results, treatment options, and if necessary, the need for more tests. Talk with your health care provider if you have any questions about your results. This information is not intended to replace advice given to you by your health care provider. Make sure you discuss any questions you have with your health care provider. Document Released: 06/21/2004 Document Revised: 02/01/2016 Document Reviewed: 10/29/2013 Elsevier Interactive Patient Education  [employer]2018 [employer]vier Inc.

## 2016-12-23 LAB — CBC
HEMATOCRIT: 35.9 % — AB (ref 38.5–50.0)
HEMOGLOBIN: 12.4 g/dL — AB (ref 13.2–17.1)
MCH: 36 pg — AB (ref 27.0–33.0)
MCHC: 34.5 g/dL (ref 32.0–36.0)
MCV: 104.4 fL — ABNORMAL HIGH (ref 80.0–100.0)
MPV: 11 fL (ref 7.5–12.5)
Platelets: 70 10*3/uL — ABNORMAL LOW (ref 140–400)
RBC: 3.44 MIL/uL — ABNORMAL LOW (ref 4.20–5.80)
RDW: 12.7 % (ref 11.0–15.0)
WBC: 3.1 10*3/uL — ABNORMAL LOW (ref 3.8–10.8)

## 2016-12-23 NOTE — Telephone Encounter (Signed)
Called and lvm for Richard BridgeMartha informing her that she can leave the information for the patient regarding his authorization for his MRI.Loralee PacasBarkley, Jonica Bickhart WaldronLynetta

## 2016-12-23 NOTE — Telephone Encounter (Signed)
Authorization received for pt [phone]#[id number].Richard PacasBarkley, Richard Jett HaskellLynetta

## 2016-12-23 NOTE — Telephone Encounter (Signed)
Richard BridgeMartha called asking for more information to precert patients MRI. I called back and lvm and also resubmitted the clinical findings . Will await call back.Loralee PacasBarkley, Nasiir Monts SmithvilleLynetta

## 2016-12-24 LAB — PROTIME-INR
INR: 1.1
Prothrombin Time: 11.4 s (ref 9.0–11.5)

## 2016-12-24 LAB — COMPLETE METABOLIC PANEL WITH GFR
ALBUMIN: 4.2 g/dL (ref 3.6–5.1)
ALK PHOS: 53 U/L (ref 40–115)
ALT: 60 U/L — ABNORMAL HIGH (ref 9–46)
AST: 54 U/L — ABNORMAL HIGH (ref 10–35)
BUN: 18 mg/dL (ref 7–25)
CALCIUM: 9.3 mg/dL (ref 8.6–10.3)
CO2: 21 mmol/L (ref 20–31)
Chloride: 104 mmol/L (ref 98–110)
Creat: 0.84 mg/dL (ref 0.70–1.33)
GFR, Est African American: >89 mL/min (ref >=60)
Glucose, Bld: 112 mg/dL — ABNORMAL HIGH (ref 65–99)
POTASSIUM: 3.3 mmol/L — AB (ref 3.5–5.3)
Sodium: 140 mmol/L (ref 135–146)
Total Bilirubin: 1 mg/dL (ref 0.2–1.2)
Total Protein: 7.1 g/dL (ref 6.1–8.1)

## 2016-12-24 LAB — AMMONIA: AMMONIA: 75 umol/L — AB (ref <=47)

## 2016-12-26 ENCOUNTER — Ambulatory Visit (INDEPENDENT_AMBULATORY_CARE_PROVIDER_SITE_OTHER): Payer: PRIVATE HEALTH INSURANCE

## 2016-12-26 DIAGNOSIS — G930 Cerebral cysts: Secondary | ICD-10-CM

## 2016-12-26 DIAGNOSIS — Z7409 Other reduced mobility: Secondary | ICD-10-CM

## 2016-12-26 DIAGNOSIS — R251 Tremor, unspecified: Secondary | ICD-10-CM

## 2016-12-26 MED ORDER — GADOBENATE DIMEGLUMINE 529 MG/ML IV SOLN
15.0000 mL | Freq: Once | INTRAVENOUS | Status: AC | PRN
  Administered 2016-12-26: 14 mL via INTRAVENOUS

## 2016-12-28 ENCOUNTER — Encounter: Payer: Self-pay | Admitting: Family Medicine

## 2016-12-28 ENCOUNTER — Ambulatory Visit (INDEPENDENT_AMBULATORY_CARE_PROVIDER_SITE_OTHER): Payer: PRIVATE HEALTH INSURANCE | Admitting: Family Medicine

## 2016-12-28 VITALS — BP 101/61 | HR 72 | Ht 71.0 in | Wt 160.0 lb

## 2016-12-28 DIAGNOSIS — F10230 Alcohol dependence with withdrawal, uncomplicated: Secondary | ICD-10-CM

## 2016-12-28 DIAGNOSIS — I10 Essential (primary) hypertension: Secondary | ICD-10-CM

## 2016-12-28 DIAGNOSIS — G930 Cerebral cysts: Secondary | ICD-10-CM

## 2016-12-28 DIAGNOSIS — R7989 Other specified abnormal findings of blood chemistry: Secondary | ICD-10-CM | POA: Diagnosis not present

## 2016-12-28 NOTE — Progress Notes (Signed)
Richard Andrews is a 56 y.o. male who presents to [name]: Primary [facility] today for follow-up tremor. Mr. Tungate is here to follow-up his tremor. He was seen last week for tremor and jitteriness. Subsequently thought to be due to alcohol withdrawal and elevated ammonia levels. He had a brain MRI 2 days ago which was largely unremarkable. He has finished his Librium taper and has been abstaining from alcohol and feels much better. Additionally he has started taking his lactulose and notes that he feels well.   Past Medical History:  Diagnosis Date  . Allergy   . Distal radius fracture, left   . Hypertension    Past Surgical History:  Procedure Laterality Date  . OPEN REDUCTION INTERNAL FIXATION (ORIF) DISTAL RADIAL FRACTURE Left 01/28/2015   Procedure: OPEN REDUCTION INTERNAL FIXATION (ORIF) LEFT DISTAL RADIAL FRACTURE;  Surgeon: Tarry Kos, MD;  Location:  [facility];  Service: Orthopedics;  Laterality: Left;  . TONSILLECTOMY     Social History  Substance Use Topics  . Smoking status: Current Every Day Smoker    Packs/day: 1.00    Years: 10.00    Types: Cigarettes  . Smokeless tobacco: Current User    Types: Chew  . Alcohol use 12.0 oz/week    20 Cans of beer per week     Comment: daily   family history includes Heart disease in his mother; Hyperlipidemia in his mother.  ROS as above:  Medications: Current Outpatient Prescriptions  Medication Sig Dispense Refill  . aspirin EC 81 MG tablet Take 81 mg by mouth daily.    . chlordiazePOXIDE (LIBRIUM) 25 MG capsule 50mg  PO TID x 1D, then 25-50mg  PO BID X 1D, then 25-50mg  PO QD X 1D 10 capsule 0  . folic acid (FOLVITE) 1 MG tablet Take 1 tablet (1 mg total) by mouth daily. 30 tablet 1  . lactulose, encephalopathy, (CHRONULAC) 10 GM/15ML SOLN Take 15 mLs (10 g total) by mouth 2 (two) times daily. 2700 mL 12  .  lisinopril-hydrochlorothiazide (PRINZIDE,ZESTORETIC) 10-12.5 MG tablet TAKE 1 TABLET BY MOUTH DAILY. 90 tablet 1  . multivitamin (ONE-A-DAY MEN'S) TABS tablet Take 1 tablet by mouth daily. 30 tablet 0  . thiamine (VITAMIN B-1) 100 MG tablet Take 1 tablet (100 mg total) by mouth daily. 30 tablet 0   No current facility-administered medications for this visit.    No Known Allergies  Health Maintenance Health Maintenance  Topic Date Due  . HIV Screening  09/08/1974  . COLONOSCOPY  09/07/2009  . INFLUENZA VACCINE  02/11/2017 (Originally 01/11/2017)  . TETANUS/TDAP  05/03/2021  . Hepatitis C Screening  Completed     Exam:  BP 101/61   Pulse 72   Ht 5\' 11"  (1.803 m)   Wt 160 lb (72.6 kg)   BMI 22.32 kg/m  Gen: Well NAD HEENT: EOMI,  MMM Lungs: Normal work of breathing. CTABL Heart: RRR no MRG Abd: NABS, Soft. Nondistended, Nontender Exts: Brisk capillary refill, warm and well perfused.   Component     Latest Ref Rng & Units 12/20/2016 12/21/2016  Ammonia     < OR = 47 umol/L 55 (H) 75 (H)    No results found for this or any previous visit (from the past 72 hour(s)). Mr Laqueta Jean Wo Contrast  Result Date: 12/26/2016 CLINICAL DATA:  56 year old male with new tremor and loss of balance. Dizziness for 2 weeks. Slurred speech. Query stroke. EXAM: MRI  HEAD WITHOUT AND WITH CONTRAST TECHNIQUE: Multiplanar, multiecho pulse sequences of the brain and surrounding structures were obtained without and with intravenous contrast. CONTRAST:  14mL MULTIHANCE GADOBENATE DIMEGLUMINE 529 MG/ML IV SOLN COMPARISON:  None. FINDINGS: Brain: There is an oval CSF isointense fluid collection in the left middle cranial fossa encompassing 33 x 30 x 29 mm (AP by transverse by CC) with no enhancement compatible with simple arachnoid cyst. Mass effect on the anterior left temporal lobe without edema. No other extra-axial collection identified, and overall cerebral volume is normal for age. No restricted diffusion to  suggest acute infarction. No midline shift, other mass lesion, ventriculomegaly, or acute intracranial hemorrhage. Cervicomedullary junction and pituitary are within normal limits. No cortical encephalomalacia or chronic cerebral blood products. Mild for age scattered small nonspecific foci of cerebral white matter T2 and FLAIR hyperintensity. The left corona radiata is most affected. Deep gray matter nuclei, the brainstem, and cerebellum appear normal. No abnormal enhancement identified. No dural thickening. Vascular: Major intracranial vascular flow voids are preserved and appear normal. Skull and upper cervical spine: Negative. Visualized bone marrow signal is within normal limits. Sinuses/Orbits: Orbits soft tissues are within normal limits. Left maxillary sinus mucous retention cyst. Trace paranasal sinus mucosal thickening otherwise. Other: Mastoid air cells are clear. Visible internal auditory structures appear normal. Negative scalp soft tissues. IMPRESSION: 1.  No acute intracranial abnormality. 2. Left middle cranial fossa a 3 cm arachnoid cyst is chronic or congenital and appears inconsequential. 3. Mild for age nonspecific cerebral white matter signal changes which might reflect chronic small vessel disease. Otherwise no prior infarct identified. Electronically Signed   By: Odessa FlemingH  Hall M.D.   On: 12/26/2016 15:00      Assessment and Plan: 56 y.o. male with  Elevated ammonia levels. Patient seems to be doing well clinically. Continue lactulose and recheck ammonia in about 5 or 6 days.  Arachnoid cyst: Likely benign seen on MRI. Plan to follow along.  Alcohol abuse: Doing well. Continue abstinence. Recheck in one month.   No orders of the defined types were placed in this encounter.  No orders of the defined types were placed in this encounter.    Discussed warning signs or symptoms. Please see discharge instructions. Patient expresses understanding.

## 2016-12-28 NOTE — Patient Instructions (Addendum)
Thank you for coming in today. Get labs next week.  Recheck with me in 1 month or sooner based on labs.  Continue to avoid medium to heavy alcohol.  Avoid triggers.   Continue lactulose.   STOP blood pressure medicine.     Ammonia Test Why am I having this test? Ammonia testing is used to help diagnose and monitor severe liver diseases. It is also used to diagnose and monitor a brain disorder that can develop in individuals who have liver disease (hepatic encephalopathy). Ammonia levels can rise when the liver and kidneys are not working well enough to get rid of urea. A buildup of ammonia in the body can cause mental and neurological changes that can lead to confusion, disorientation, sleepiness, and eventually coma and even death. Infants and children with increased ammonia levels may vomit often, be irritable, and be increasingly lethargic. Without treatment they may experience seizures and breathing difficulty and go into a coma and die. What kind of sample is taken? A blood sample is needed for this test. It is usually collected by inserting a needle into a vein. How do I prepare for this test? Follow instructions from your health care provider or your child's health care provider about avoiding these before the test:  Exercise.  Smoking cigarettes.  Certain medicines.  What are the reference ranges? Reference ranges are considered healthy ranges established after testing a large group of healthy people. Reference ranges may vary among different people, labs, and hospitals. It is your responsibility to obtain the test results. Ask the lab or department performing the test when and how you will get the results. What do the results mean? Increased levels of ammonia may mean that you have or your child has:  Liver disease.  Gastrointestinal (GI) bleeding or GI obstruction.  Severe heart failure.  Hemolytic disease of newborn.  Hepatic encephalopathy.  A genetic metabolic  disorder.  Decreased levels of ammonia may mean that you have or your child has:  High blood pressure (hypertension).  A genetic metabolic syndrome.  Talk with your health care provider to discuss the results, treatment options, and if necessary, the need for more tests. Talk with your health care provider if you have any questions about your results. Talk with your health care provider to discuss your results, treatment options, and if necessary, the need for more tests. Talk with your health care provider if you have any questions about your results. This information is not intended to replace advice given to you by your health care provider. Make sure you discuss any questions you have with your health care provider. Document Released: 06/21/2004 Document Revised: 02/01/2016 Document Reviewed: 10/29/2013 Elsevier Interactive Patient Education  [employer]2018 [employer] Inc.

## 2016-12-30 ENCOUNTER — Ambulatory Visit: Payer: PRIVATE HEALTH INSURANCE | Admitting: Family Medicine

## 2017-01-07 LAB — AMMONIA: AMMONIA: 66 umol/L — AB (ref <=47)

## 2017-01-09 ENCOUNTER — Other Ambulatory Visit: Payer: Self-pay | Admitting: Sports Medicine

## 2017-01-09 DIAGNOSIS — R7989 Other specified abnormal findings of blood chemistry: Secondary | ICD-10-CM

## 2017-01-09 NOTE — Progress Notes (Signed)
Ammonia levels have come down but still elevated, continue lactulose but increase to 30 mL 3 times per day. Have tremors resolved?  Follow-up with PCP in 2 weeks.

## 2017-01-26 ENCOUNTER — Encounter: Payer: Self-pay | Admitting: Family Medicine

## 2017-01-26 ENCOUNTER — Ambulatory Visit (INDEPENDENT_AMBULATORY_CARE_PROVIDER_SITE_OTHER): Payer: PRIVATE HEALTH INSURANCE | Admitting: Family Medicine

## 2017-01-26 VITALS — BP 153/81 | HR 64 | Wt 159.0 lb

## 2017-01-26 DIAGNOSIS — R7989 Other specified abnormal findings of blood chemistry: Secondary | ICD-10-CM | POA: Diagnosis not present

## 2017-01-26 DIAGNOSIS — F1020 Alcohol dependence, uncomplicated: Secondary | ICD-10-CM

## 2017-01-26 DIAGNOSIS — R740 Nonspecific elevation of levels of transaminase and lactic acid dehydrogenase [LDH]: Secondary | ICD-10-CM

## 2017-01-26 DIAGNOSIS — I10 Essential (primary) hypertension: Secondary | ICD-10-CM | POA: Diagnosis not present

## 2017-01-26 DIAGNOSIS — R7401 Elevation of levels of liver transaminase levels: Secondary | ICD-10-CM

## 2017-01-26 MED ORDER — LACTULOSE ENCEPHALOPATHY 10 GM/15ML PO SOLN: 30.0000 g | Freq: Three times a day (TID) | ORAL | 12 refills | Status: DC

## 2017-01-26 MED ORDER — LISINOPRIL-HYDROCHLOROTHIAZIDE 10-12.5 MG PO TABS: 1.0000 | ORAL_TABLET | Freq: Every day | ORAL | 3 refills | Status: DC

## 2017-01-26 NOTE — Progress Notes (Signed)
Richard DuralLarry F Andrews is a 56 y.o. male who [facility]: [facility] today for follow-up hypertension and elevated ammonia level and alcohol abuse.  Patient was seen several times last month for what essentially was found to be metabolic encephalopathy due to elevated ammonia level due to alcohol abuse and liver injury. During this time his blood pressure had decreased and we stopped his lisinopril/hydrochlorothiazide. Over the past month he has significantly reduced his alcohol consumption and has been using lactulose for ammonia and feels much better.   Hypertension: Patient does not currently take any medications. No chest pain palpitations or shortness of breath.  Elevated ammonia: Patient denies any further fogginess tremors and continues to take lactulose 3 times daily.  Alcohol abuse: Patient has reduced his alcohol consumption by half often is abstinent for multiple days at a time. He feels well.   Past Medical History:  Diagnosis Date  . Allergy   . Distal radius fracture, left   . Hypertension    Past Surgical History:  Procedure Laterality Date  . OPEN REDUCTION INTERNAL FIXATION (ORIF) DISTAL RADIAL FRACTURE Left 01/28/2015   Procedure: OPEN REDUCTION INTERNAL FIXATION (ORIF) LEFT DISTAL RADIAL FRACTURE;  Surgeon: Tarry KosNaiping M Xu, MD;  Location: [facility];  Service: Orthopedics;  Laterality: Left;  . TONSILLECTOMY     Social History  Substance Use Topics  . Smoking status: Current Every Day Smoker    Packs/day: 1.00    Years: 10.00    Types: Cigarettes  . Smokeless tobacco: Current User    Types: Chew  . Alcohol use 12.0 oz/week    20 Cans of beer per week     Comment: daily   family history includes Heart disease in his mother; Hyperlipidemia in his mother.  ROS as above:  Medications: Current Outpatient Prescriptions  Medication Sig  Dispense Refill  . aspirin EC 81 MG tablet Take 81 mg by mouth daily.    . folic acid (FOLVITE) 1 MG tablet Take 1 tablet (1 mg total) by mouth daily. 30 tablet 1  . lactulose, encephalopathy, (CHRONULAC) 10 GM/15ML SOLN Take 45 mLs (30 g total) by mouth 3 (three) times daily. 5676 mL 12  . lisinopril-hydrochlorothiazide (PRINZIDE,ZESTORETIC) 10-12.5 MG tablet Take 1 tablet by mouth daily. 90 tablet 3  . multivitamin (ONE-A-DAY MEN'S) TABS tablet Take 1 tablet by mouth daily. 30 tablet 0  . thiamine (VITAMIN B-1) 100 MG tablet Take 1 tablet (100 mg total) by mouth daily. 30 tablet 0   No current facility-administered medications for this visit.    No Known Allergies  Health Maintenance Health Maintenance  Topic Date Due  . HIV Screening  09/08/1975  . COLONOSCOPY  09/07/2009  . INFLUENZA VACCINE  02/11/2017 (Originally 01/11/2017)  . TETANUS/TDAP  05/03/2021  . Hepatitis C Screening  Completed     Exam:  BP (!) 153/81   Pulse 64   Wt 159 lb (72.1 kg)   BMI 22.18 kg/m  Gen: Well NAD HEENT: EOMI,  MMM Lungs: Normal work of breathing. CTABL Heart: RRR no MRG Abd: NABS, Soft. Nondistended, Nontender Exts: Brisk capillary refill, warm and well perfused. Neuro: No tremor normal balance and gait.    No results found for this or any previous visit (from the past 72 hour(s)). No results found.    Assessment and Plan: 56 y.o. male with  Hypertension: Blood pressure has increased. Plan to restart hydrochlorothiazide/lisinopril. We'll recheck metabolic panel  in a few days on this regimen.  Elevated ammonia: We'll recheck ammonia level and consider tapering down lactulose is able. Hopefully with reduced alcohol consumption 11 improved synthetic liver function and will have reduced need for lactulose.  Alcohol use: Improving. Continue to work on significantly really reduced alcohol consumption or abstinence.   Orders Placed This Encounter  Procedures  . COMPLETE METABOLIC PANEL  WITH GFR  . Ammonia   Meds ordered this encounter  Medications  . lisinopril-hydrochlorothiazide (PRINZIDE,ZESTORETIC) 10-12.5 MG tablet    Sig: Take 1 tablet by mouth daily.    Dispense:  90 tablet    Refill:  3  . lactulose, encephalopathy, (CHRONULAC) 10 GM/15ML SOLN    Sig: Take 45 mLs (30 g total) by mouth 3 (three) times daily.    Dispense:  5676 mL    Refill:  12     Discussed warning signs or symptoms. Please see discharge instructions. Patient expresses understanding.

## 2017-01-26 NOTE — Patient Instructions (Addendum)
Thank you for coming in today. Restart blood pressure medicine.  Get labs.  I will let you know the results.   Continue lactulose.

## 2017-02-08 ENCOUNTER — Other Ambulatory Visit: Payer: Self-pay | Admitting: Family Medicine

## 2017-03-26 ENCOUNTER — Other Ambulatory Visit: Payer: Self-pay | Admitting: Family Medicine

## 2017-04-26 ENCOUNTER — Other Ambulatory Visit: Payer: Self-pay | Admitting: Family Medicine

## 2017-10-30 ENCOUNTER — Other Ambulatory Visit: Payer: Self-pay | Admitting: Family Medicine

## 2018-02-25 ENCOUNTER — Other Ambulatory Visit: Payer: Self-pay | Admitting: Family Medicine

## 2020-01-10 ENCOUNTER — Emergency Department (HOSPITAL_COMMUNITY): Payer: BC Managed Care – PPO | Admitting: Anesthesiology

## 2020-01-10 ENCOUNTER — Emergency Department (HOSPITAL_COMMUNITY): Payer: BC Managed Care – PPO

## 2020-01-10 ENCOUNTER — Inpatient Hospital Stay (HOSPITAL_COMMUNITY)
Admission: EM | Admit: 2020-01-10 | Discharge: 2020-01-13 | DRG: 982 | Disposition: A | Discharge status: Home / Self Care | Payer: BC Managed Care – PPO | Attending: Orthopedic Surgery | Admitting: Orthopedic Surgery

## 2020-01-10 ENCOUNTER — Encounter (HOSPITAL_COMMUNITY): Payer: Self-pay | Admitting: Internal Medicine

## 2020-01-10 ENCOUNTER — Other Ambulatory Visit: Payer: Self-pay

## 2020-01-10 ENCOUNTER — Encounter (HOSPITAL_COMMUNITY)
Admission: EM | Disposition: A | Discharge status: Home / Self Care | Payer: Self-pay | Source: Home / Self Care | Attending: Orthopedic Surgery

## 2020-01-10 DIAGNOSIS — I10 Essential (primary) hypertension: Secondary | ICD-10-CM | POA: Diagnosis present

## 2020-01-10 DIAGNOSIS — F10239 Alcohol dependence with withdrawal, unspecified: Secondary | ICD-10-CM | POA: Diagnosis present

## 2020-01-10 DIAGNOSIS — Z8249 Family history of ischemic heart disease and other diseases of the circulatory system: Secondary | ICD-10-CM | POA: Diagnosis not present

## 2020-01-10 DIAGNOSIS — Z20822 Contact with and (suspected) exposure to covid-19: Secondary | ICD-10-CM | POA: Diagnosis present

## 2020-01-10 DIAGNOSIS — S52501C Unspecified fracture of the lower end of right radius, initial encounter for open fracture type IIIA, IIIB, or IIIC: Secondary | ICD-10-CM

## 2020-01-10 DIAGNOSIS — S52601A Unspecified fracture of lower end of right ulna, initial encounter for closed fracture: Secondary | ICD-10-CM | POA: Diagnosis present

## 2020-01-10 DIAGNOSIS — E876 Hypokalemia: Secondary | ICD-10-CM | POA: Diagnosis present

## 2020-01-10 DIAGNOSIS — Y908 Blood alcohol level of 240 mg/100 ml or more: Secondary | ICD-10-CM | POA: Diagnosis present

## 2020-01-10 DIAGNOSIS — Z83438 Family history of other disorder of lipoprotein metabolism and other lipidemia: Secondary | ICD-10-CM | POA: Diagnosis not present

## 2020-01-10 DIAGNOSIS — D6959 Other secondary thrombocytopenia: Secondary | ICD-10-CM | POA: Diagnosis present

## 2020-01-10 DIAGNOSIS — F1020 Alcohol dependence, uncomplicated: Secondary | ICD-10-CM | POA: Diagnosis present

## 2020-01-10 DIAGNOSIS — Z23 Encounter for immunization: Secondary | ICD-10-CM | POA: Diagnosis not present

## 2020-01-10 DIAGNOSIS — F17210 Nicotine dependence, cigarettes, uncomplicated: Secondary | ICD-10-CM | POA: Diagnosis present

## 2020-01-10 DIAGNOSIS — S52531B Colles' fracture of right radius, initial encounter for open fracture type I or II: Secondary | ICD-10-CM | POA: Diagnosis present

## 2020-01-10 DIAGNOSIS — S52531A Colles' fracture of right radius, initial encounter for closed fracture: Secondary | ICD-10-CM | POA: Diagnosis present

## 2020-01-10 DIAGNOSIS — D7589 Other specified diseases of blood and blood-forming organs: Secondary | ICD-10-CM | POA: Diagnosis present

## 2020-01-10 DIAGNOSIS — W010XXA Fall on same level from slipping, tripping and stumbling without subsequent striking against object, initial encounter: Secondary | ICD-10-CM | POA: Diagnosis present

## 2020-01-10 DIAGNOSIS — S41119A Laceration without foreign body of unspecified upper arm, initial encounter: Principal | ICD-10-CM | POA: Diagnosis present

## 2020-01-10 DIAGNOSIS — K709 Alcoholic liver disease, unspecified: Secondary | ICD-10-CM | POA: Diagnosis present

## 2020-01-10 HISTORY — PX: I & D EXTREMITY: SHX5045

## 2020-01-10 LAB — CBC WITH DIFFERENTIAL/PLATELET
Abs Immature Granulocytes: 0.02 10*3/uL (ref 0.00–0.07)
Basophils Absolute: 0 10*3/uL (ref 0.0–0.1)
Basophils Relative: 1 %
Eosinophils Absolute: 0 10*3/uL (ref 0.0–0.5)
Eosinophils Relative: 1 %
HCT: 39.6 % (ref 39.0–52.0)
Hemoglobin: 13.5 g/dL (ref 13.0–17.0)
Immature Granulocytes: 0 %
Lymphocytes Relative: 16 %
Lymphs Abs: 0.8 10*3/uL (ref 0.7–4.0)
MCH: 36.3 pg — ABNORMAL HIGH (ref 26.0–34.0)
MCHC: 34.1 g/dL (ref 30.0–36.0)
MCV: 106.5 fL — ABNORMAL HIGH (ref 80.0–100.0)
Monocytes Absolute: 0.5 10*3/uL (ref 0.1–1.0)
Monocytes Relative: 10 %
Neutro Abs: 3.8 10*3/uL (ref 1.7–7.7)
Neutrophils Relative %: 72 %
Platelets: 83 10*3/uL — ABNORMAL LOW (ref 150–400)
RBC: 3.72 MIL/uL — ABNORMAL LOW (ref 4.22–5.81)
RDW: 11.9 % (ref 11.5–15.5)
WBC: 5.2 10*3/uL (ref 4.0–10.5)
nRBC: 0 % (ref 0.0–0.2)

## 2020-01-10 LAB — BASIC METABOLIC PANEL
Anion gap: 14 (ref 5–15)
BUN: 7 mg/dL (ref 6–20)
CO2: 21 mmol/L — ABNORMAL LOW (ref 22–32)
Calcium: 8.9 mg/dL (ref 8.9–10.3)
Chloride: 106 mmol/L (ref 98–111)
Creatinine, Ser: 0.93 mg/dL (ref 0.61–1.24)
GFR calc Af Amer: >60 mL/min (ref >60)
GFR calc non Af Amer: >60 mL/min (ref >60)
Glucose, Bld: 120 mg/dL — ABNORMAL HIGH (ref 70–99)
Potassium: 3.6 mmol/L (ref 3.5–5.1)
Sodium: 141 mmol/L (ref 135–145)

## 2020-01-10 LAB — SARS CORONAVIRUS 2 BY RT PCR (HOSPITAL ORDER, PERFORMED IN ~~LOC~~ HOSPITAL LAB): SARS Coronavirus 2: NEGATIVE

## 2020-01-10 LAB — ETHANOL: Alcohol, Ethyl (B): 332 mg/dL (ref <10)

## 2020-01-10 SURGERY — IRRIGATION AND DEBRIDEMENT EXTREMITY
Anesthesia: General | Laterality: Right

## 2020-01-10 MED ORDER — SODIUM CHLORIDE (PF) 0.9 % IJ SOLN
INTRAMUSCULAR | Status: AC
  Filled 2020-01-10: qty 10

## 2020-01-10 MED ORDER — LIDOCAINE 2% (20 MG/ML) 5 ML SYRINGE
INTRAMUSCULAR | Status: DC | PRN
  Administered 2020-01-10: 60 mg via INTRAVENOUS

## 2020-01-10 MED ORDER — BUPIVACAINE HCL (PF) 0.25 % IJ SOLN: INTRAMUSCULAR | Status: DC | PRN

## 2020-01-10 MED ORDER — KETOROLAC TROMETHAMINE 30 MG/ML IJ SOLN
30.0000 mg | Freq: Once | INTRAMUSCULAR | Status: AC
  Administered 2020-01-10: 30 mg via INTRAVENOUS

## 2020-01-10 MED ORDER — LACTATED RINGERS IV SOLN: INTRAVENOUS | Status: DC | PRN

## 2020-01-10 MED ORDER — SODIUM CHLORIDE 0.9 % IV SOLN
INTRAVENOUS | Status: DC | PRN
Start: 2020-01-10 — End: 2020-01-10

## 2020-01-10 MED ORDER — SUFENTANIL CITRATE 50 MCG/ML IV SOLN
INTRAVENOUS | Status: DC | PRN
  Administered 2020-01-10: 10 ug via INTRAVENOUS

## 2020-01-10 MED ORDER — GENTAMICIN SULFATE 40 MG/ML IJ SOLN
170.0000 mg | Freq: Once | INTRAVENOUS | Status: AC
  Administered 2020-01-10: 170 mg via INTRAVENOUS
  Filled 2020-01-10: qty 4.25

## 2020-01-10 MED ORDER — BUPIVACAINE HCL (PF) 0.25 % IJ SOLN
INTRAMUSCULAR | Status: AC
  Filled 2020-01-10: qty 30

## 2020-01-10 MED ORDER — PROPOFOL 10 MG/ML IV BOLUS
INTRAVENOUS | Status: AC
  Filled 2020-01-10: qty 20

## 2020-01-10 MED ORDER — ACETAMINOPHEN 10 MG/ML IV SOLN: 1000.0000 mg | Freq: Once | INTRAVENOUS | Status: DC | PRN

## 2020-01-10 MED ORDER — PROMETHAZINE HCL 25 MG/ML IJ SOLN: 6.2500 mg | INTRAMUSCULAR | Status: DC | PRN

## 2020-01-10 MED ORDER — SUFENTANIL CITRATE 50 MCG/ML IV SOLN
INTRAVENOUS | Status: AC
  Filled 2020-01-10: qty 1

## 2020-01-10 MED ORDER — KETOROLAC TROMETHAMINE 30 MG/ML IJ SOLN
INTRAMUSCULAR | Status: AC
  Filled 2020-01-10: qty 1

## 2020-01-10 MED ORDER — CEFAZOLIN SODIUM-DEXTROSE 2-4 GM/100ML-% IV SOLN
2.0000 g | Freq: Once | INTRAVENOUS | Status: AC
  Administered 2020-01-10: 2 g via INTRAVENOUS
  Filled 2020-01-10: qty 100

## 2020-01-10 MED ORDER — FOLIC ACID 1 MG PO TABS
1.0000 mg | ORAL_TABLET | Freq: Every day | ORAL | Status: DC
  Administered 2020-01-11 – 2020-01-12 (×2): 1 mg via ORAL
  Filled 2020-01-10 (×2): qty 1

## 2020-01-10 MED ORDER — EPHEDRINE 5 MG/ML INJ
INTRAVENOUS | Status: AC
  Filled 2020-01-10: qty 10

## 2020-01-10 MED ORDER — THIAMINE HCL 100 MG/ML IJ SOLN: 100.0000 mg | Freq: Every day | INTRAMUSCULAR | Status: DC

## 2020-01-10 MED ORDER — OXYCODONE HCL 5 MG PO TABS: 5.0000 mg | ORAL_TABLET | Freq: Once | ORAL | Status: DC | PRN

## 2020-01-10 MED ORDER — ADULT MULTIVITAMIN W/MINERALS CH
1.0000 | ORAL_TABLET | Freq: Every day | ORAL | Status: DC
  Administered 2020-01-11 – 2020-01-12 (×2): 1 via ORAL
  Filled 2020-01-10 (×2): qty 1

## 2020-01-10 MED ORDER — DEXAMETHASONE SODIUM PHOSPHATE 10 MG/ML IJ SOLN
INTRAMUSCULAR | Status: DC | PRN
  Administered 2020-01-10: 10 mg via INTRAVENOUS

## 2020-01-10 MED ORDER — DEXAMETHASONE SODIUM PHOSPHATE 10 MG/ML IJ SOLN
INTRAMUSCULAR | Status: AC
  Filled 2020-01-10: qty 1

## 2020-01-10 MED ORDER — PROPOFOL 10 MG/ML IV BOLUS
INTRAVENOUS | Status: DC | PRN
  Administered 2020-01-10: 200 mg via INTRAVENOUS

## 2020-01-10 MED ORDER — LORAZEPAM 1 MG PO TABS
1.0000 mg | ORAL_TABLET | ORAL | Status: DC | PRN
  Filled 2020-01-10: qty 2

## 2020-01-10 MED ORDER — LORAZEPAM 1 MG PO TABS: 0.0000 mg | ORAL_TABLET | Freq: Two times a day (BID) | ORAL | Status: DC

## 2020-01-10 MED ORDER — EPHEDRINE SULFATE 50 MG/ML IJ SOLN
INTRAMUSCULAR | Status: DC | PRN
Start: 2020-01-10 — End: 2020-01-10
  Administered 2020-01-10 (×5): 10 mg via INTRAVENOUS

## 2020-01-10 MED ORDER — SUCCINYLCHOLINE CHLORIDE 200 MG/10ML IV SOSY
PREFILLED_SYRINGE | INTRAVENOUS | Status: DC | PRN
  Administered 2020-01-10: 80 mg via INTRAVENOUS

## 2020-01-10 MED ORDER — MIDAZOLAM HCL 2 MG/2ML IJ SOLN
INTRAMUSCULAR | Status: AC
  Filled 2020-01-10: qty 2

## 2020-01-10 MED ORDER — TETANUS-DIPHTH-ACELL PERTUSSIS 5-2.5-18.5 LF-MCG/0.5 IM SUSP
0.5000 mL | Freq: Once | INTRAMUSCULAR | Status: AC
  Administered 2020-01-10: 0.5 mL via INTRAMUSCULAR
  Filled 2020-01-10: qty 0.5

## 2020-01-10 MED ORDER — MIDAZOLAM HCL 2 MG/2ML IJ SOLN
INTRAMUSCULAR | Status: DC | PRN
  Administered 2020-01-10: 2 mg via INTRAVENOUS

## 2020-01-10 MED ORDER — OXYCODONE HCL 5 MG/5ML PO SOLN: 5.0000 mg | Freq: Once | ORAL | Status: DC | PRN

## 2020-01-10 MED ORDER — SODIUM CHLORIDE 0.9 % IR SOLN
Status: DC | PRN
  Administered 2020-01-10: 1000 mL
  Administered 2020-01-10: 6000 mL

## 2020-01-10 MED ORDER — ONDANSETRON HCL 4 MG/2ML IJ SOLN
INTRAMUSCULAR | Status: DC | PRN
  Administered 2020-01-10: 4 mg via INTRAVENOUS

## 2020-01-10 MED ORDER — HYDROMORPHONE HCL 1 MG/ML IJ SOLN
0.2500 mg | INTRAMUSCULAR | Status: DC | PRN
  Administered 2020-01-10: 0.25 mg via INTRAVENOUS
  Administered 2020-01-10: 0.5 mg via INTRAVENOUS

## 2020-01-10 MED ORDER — LORAZEPAM 2 MG/ML IJ SOLN: 1.0000 mg | INTRAMUSCULAR | Status: DC | PRN

## 2020-01-10 MED ORDER — FENTANYL CITRATE (PF) 100 MCG/2ML IJ SOLN
50.0000 ug | Freq: Once | INTRAMUSCULAR | Status: AC
  Administered 2020-01-10: 50 ug via INTRAVENOUS
  Filled 2020-01-10: qty 2

## 2020-01-10 MED ORDER — CEFAZOLIN SODIUM-DEXTROSE 2-3 GM-%(50ML) IV SOLR
INTRAVENOUS | Status: DC | PRN
  Administered 2020-01-10: 2 g via INTRAVENOUS

## 2020-01-10 MED ORDER — SUCCINYLCHOLINE CHLORIDE 200 MG/10ML IV SOSY
PREFILLED_SYRINGE | INTRAVENOUS | Status: AC
  Filled 2020-01-10: qty 10

## 2020-01-10 MED ORDER — ONDANSETRON HCL 4 MG/2ML IJ SOLN
INTRAMUSCULAR | Status: AC
  Filled 2020-01-10: qty 2

## 2020-01-10 MED ORDER — LORAZEPAM 2 MG PO TABS
0.0000 mg | ORAL_TABLET | Freq: Four times a day (QID) | ORAL | Status: AC
  Administered 2020-01-11: 2 mg via ORAL

## 2020-01-10 MED ORDER — CEFAZOLIN SODIUM 1 G IJ SOLR
INTRAMUSCULAR | Status: AC
  Filled 2020-01-10: qty 20

## 2020-01-10 MED ORDER — HYDROMORPHONE HCL 1 MG/ML IJ SOLN
INTRAMUSCULAR | Status: AC
  Filled 2020-01-10: qty 1

## 2020-01-10 MED ORDER — IRRISEPT - 450ML BOTTLE WITH 0.05% CHG IN STERILE WATER, USP 99.95% OPTIME
TOPICAL | Status: DC | PRN
  Administered 2020-01-10: 450 mL

## 2020-01-10 MED ORDER — THIAMINE HCL 100 MG PO TABS
100.0000 mg | ORAL_TABLET | Freq: Every day | ORAL | Status: DC
  Administered 2020-01-11 – 2020-01-12 (×2): 100 mg via ORAL
  Filled 2020-01-10 (×2): qty 1

## 2020-01-10 MED ORDER — ACETAMINOPHEN 10 MG/ML IV SOLN
1000.0000 mg | Freq: Once | INTRAVENOUS | Status: AC
  Administered 2020-01-10: 1000 mg via INTRAVENOUS
  Filled 2020-01-10: qty 100

## 2020-01-10 SURGICAL SUPPLY — 75 items
0.62 KWIRE ×2 IMPLANT
BIT DRILL 2.2 SS TIBIAL (BIT) ×2 IMPLANT
BLADE 15 SAFETY STRL DISP (BLADE) ×6 IMPLANT
BNDG CMPR 75X41 PLY ABS (GAUZE/BANDAGES/DRESSINGS) ×1
BNDG CONFORM 2 STRL LF (GAUZE/BANDAGES/DRESSINGS) ×4 IMPLANT
BNDG CONFORM 3 STRL LF (GAUZE/BANDAGES/DRESSINGS) ×2 IMPLANT
BNDG ELASTIC 4X5.8 VLCR STR LF (GAUZE/BANDAGES/DRESSINGS) ×5 IMPLANT
BNDG GAUZE ELAST 4 BULKY (GAUZE/BANDAGES/DRESSINGS) ×9 IMPLANT
BNDG STRETCH 4X75 NS LF (GAUZE/BANDAGES/DRESSINGS) ×2 IMPLANT
CORD BIPOLAR FORCEPS 12FT (ELECTRODE) ×3 IMPLANT
COVER SURGICAL LIGHT HANDLE (MISCELLANEOUS) ×5 IMPLANT
COVER WAND RF STERILE (DRAPES) ×3 IMPLANT
CUFF TOURN SGL LL 8 NO SLV (TOURNIQUET CUFF) ×2 IMPLANT
CUFF TOURN SGL QUICK 18X4 (TOURNIQUET CUFF) ×3 IMPLANT
CUFF TOURN SGL QUICK 24 (TOURNIQUET CUFF)
CUFF TRNQT CYL 24X4X16.5-23 (TOURNIQUET CUFF) IMPLANT
DRILL BIT ×1 IMPLANT
DRSG ADAPTIC 3X8 NADH LF (GAUZE/BANDAGES/DRESSINGS) ×3 IMPLANT
EVACUATOR 1/8 PVC DRAIN (DRAIN) ×4 IMPLANT
GAUZE 4X4 16PLY RFD (DISPOSABLE) ×2 IMPLANT
GAUZE SPONGE 4X4 12PLY STRL (GAUZE/BANDAGES/DRESSINGS) ×3 IMPLANT
GAUZE SPONGE 4X4 12PLY STRL LF (GAUZE/BANDAGES/DRESSINGS) ×2 IMPLANT
GAUZE XEROFORM 1X8 LF (GAUZE/BANDAGES/DRESSINGS) ×3 IMPLANT
GAUZE XEROFORM 5X9 LF (GAUZE/BANDAGES/DRESSINGS) ×4 IMPLANT
GLOVE BIOGEL M 8.0 STRL (GLOVE) ×3 IMPLANT
GLOVE SS BIOGEL STRL SZ 8 (GLOVE) ×1 IMPLANT
GLOVE SUPERSENSE BIOGEL SZ 8 (GLOVE) ×2
GOWN STRL REUS W/ TWL LRG LVL3 (GOWN DISPOSABLE) ×1 IMPLANT
GOWN STRL REUS W/ TWL XL LVL3 (GOWN DISPOSABLE) ×2 IMPLANT
GOWN STRL REUS W/TWL LRG LVL3 (GOWN DISPOSABLE) ×3
GOWN STRL REUS W/TWL XL LVL3 (GOWN DISPOSABLE) ×6
JET LAVAGE IRRISEPT WOUND (IRRIGATION / IRRIGATOR) ×6
K-WIRE 0.9X102 (Wire) ×3 IMPLANT
K-WIRE 1.6 (WIRE) ×3
K-WIRE FX5X1.6XNS BN SS (WIRE) ×1
KIT BASIN OR (CUSTOM PROCEDURE TRAY) ×5 IMPLANT
KIT TURNOVER KIT B (KITS) ×3 IMPLANT
KWIRE 0.9X102 (Wire) IMPLANT
KWIRE FX5X1.6XNS BN SS (WIRE) IMPLANT
LAVAGE JET IRRISEPT WOUND (IRRIGATION / IRRIGATOR) IMPLANT
MANIFOLD NEPTUNE II (INSTRUMENTS) ×3 IMPLANT
NDL HYPO 25GX1X1/2 BEV (NEEDLE) IMPLANT
NEEDLE HYPO 25GX1X1/2 BEV (NEEDLE) IMPLANT
NS IRRIG 1000ML POUR BTL (IV SOLUTION) ×3 IMPLANT
PACK ORTHO EXTREMITY (CUSTOM PROCEDURE TRAY) ×3 IMPLANT
PAD ARMBOARD 7.5X6 YLW CONV (MISCELLANEOUS) ×3 IMPLANT
PAD CAST 4YDX4 CTTN HI CHSV (CAST SUPPLIES) ×1 IMPLANT
PADDING CAST COTTON 4X4 STRL (CAST SUPPLIES) ×3
PEG LOCKING SMOOTH 2.2X20 (Screw) ×2 IMPLANT
PEG LOCKING SMOOTH 2.2X22 (Screw) ×6 IMPLANT
PEG LOCKING SMOOTH 2.2X24 (Peg) ×4 IMPLANT
PLATE MEDIUM DVR RIGHT (Plate) ×2 IMPLANT
SCREW LOCK 14X2.7X 3 LD TPR (Screw) IMPLANT
SCREW LOCK 22X2.7X 3 LD TPR (Screw) IMPLANT
SCREW LOCKING 2.7X13MM (Screw) ×8 IMPLANT
SCREW LOCKING 2.7X14 (Screw) ×6 IMPLANT
SCREW LOCKING 2.7X22MM (Screw) ×3 IMPLANT
SET CYSTO W/LG BORE CLAMP LF (SET/KITS/TRAYS/PACK) ×3 IMPLANT
SOL PREP POV-IOD 4OZ 10% (MISCELLANEOUS) ×6 IMPLANT
SOL PREP PROV IODINE SCRUB 4OZ (MISCELLANEOUS) ×2 IMPLANT
SOLUTION BETADINE 4OZ (MISCELLANEOUS) ×2 IMPLANT
SPONGE LAP 4X18 RFD (DISPOSABLE) ×3 IMPLANT
SUT FIBERWIRE 3-0 18 TAPR NDL (SUTURE) ×3
SUT PROLENE 4 0 PS 2 18 (SUTURE) ×12 IMPLANT
SUT SILK 4 0 PS 2 (SUTURE) ×2 IMPLANT
SUT VIC AB 3-0 PS2 18 (SUTURE) ×2 IMPLANT
SUTURE FIBERWR 3-0 18 TAPR NDL (SUTURE) IMPLANT
SWAB CULTURE ESWAB REG 1ML (MISCELLANEOUS) IMPLANT
SYR CONTROL 10ML LL (SYRINGE) IMPLANT
TOWEL GREEN STERILE (TOWEL DISPOSABLE) ×3 IMPLANT
TOWEL GREEN STERILE FF (TOWEL DISPOSABLE) ×3 IMPLANT
TUBE CONNECTING 12'X1/4 (SUCTIONS) ×1
TUBE CONNECTING 12X1/4 (SUCTIONS) ×2 IMPLANT
WATER STERILE IRR 1000ML POUR (IV SOLUTION) ×3 IMPLANT
YANKAUER SUCT BULB TIP NO VENT (SUCTIONS) ×3 IMPLANT

## 2020-01-10 NOTE — Anesthesia Procedure Notes (Signed)
Procedure Name: Intubation Date/Time: 01/10/2020 7:47 PM Performed by: Claris Che, CRNA Pre-anesthesia Checklist: Patient identified, Emergency Drugs available, Suction available, Patient being monitored and Timeout performed Patient Re-evaluated:Patient Re-evaluated prior to induction Oxygen Delivery Method: Circle system utilized Preoxygenation: Pre-oxygenation with 100% oxygen Induction Type: IV induction, Rapid sequence and Cricoid Pressure applied Laryngoscope Size: Mac and 4 Grade View: Grade II Tube type: Oral Tube size: 7.5 mm Number of attempts: 1 Airway Equipment and Method: Stylet Placement Confirmation: ETT inserted through vocal cords under direct vision,  positive ETCO2 and breath sounds checked- equal and bilateral Secured at: 23 cm Tube secured with: Tape Dental Injury: Teeth and Oropharynx as per pre-operative assessment

## 2020-01-10 NOTE — ED Notes (Signed)
Pt has been transported to OR.

## 2020-01-10 NOTE — ED Provider Notes (Signed)
[facility] EMERGENCY DEPARTMENT Provider Note   CSN: [id number] Arrival date & time: 01/10/20  1549     History Chief Complaint  Patient presents with  . Rt arm injury   Richard Andrews is a 59 y.o. male with hx of alcohol abuse, HTN who presents with a right wrist injury. Pt states that he was drinking alcohol today and he tripped and fell over a chair. He fell forward and sustained a R wrist injury. The bone was sticking through the skin so EMS was called. He states he usually drinks a pint of liquor daily but hasn't drank that much today. He is LHD but does a lot with his right hand. He has been NPO since 12. He took two Ibuprofen around 3PM.   HPI     Past Medical History:  Diagnosis Date  . Allergy   . Distal radius fracture, left   . Hypertension     Patient Active Problem List   Diagnosis Date Noted  . 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 Increased ammonia level 12/21/2016  . Thrombocytopenia (HCC) 12/21/2016  . Macrocytosis 03/21/2016  . Transaminitis 03/21/2016  . Alcohol dependence (HCC) 02/12/2016  . Essential hypertension 01/22/2015    Past Surgical History:  Procedure Laterality Date  . OPEN REDUCTION INTERNAL FIXATION (ORIF) DISTAL RADIAL FRACTURE Left 01/28/2015   Procedure: OPEN REDUCTION INTERNAL FIXATION (ORIF) LEFT DISTAL RADIAL FRACTURE;  Surgeon: Tarry Kos, MD;  Location: [facility];  Service: Orthopedics;  Laterality: Left;  . TONSILLECTOMY         Family History  Problem Relation Age of Onset  . Heart disease Mother   . Hyperlipidemia Mother     Social History   Tobacco Use  . Smoking status: Current Every Day Smoker    Packs/day: 1.00    Years: 10.00    Pack years: 10.00    Types: Cigarettes  . Smokeless tobacco: Current User    Types: Chew  Substance Use Topics  . Alcohol use: Yes    Alcohol/week: 20.0 standard drinks    Types: 20 Cans of beer per week    Comment: daily  . Drug use: No      Home Medications Prior to Admission medications   Medication Sig Start Date End Date Taking? Authorizing Provider  aspirin EC 81 MG tablet Take 81 mg by mouth daily.    [provider]  [facility] B-1 100 MG tablet TAKE 1 TABLET BY MOUTH EVERY DAY 03/27/17   Rodolph Bong, MD  folic acid (FOLVITE) 1 MG tablet TAKE 1 TABLET BY MOUTH EVERY DAY 10/30/17   Rodolph Bong, MD  lactulose, encephalopathy, (CHRONULAC) 10 GM/15ML SOLN Take 45 mLs (30 g total) by mouth 3 (three) times daily. 01/26/17   Rodolph Bong, MD  lisinopril-hydrochlorothiazide (PRINZIDE,ZESTORETIC) 10-12.5 MG tablet TAKE 1 TABLET BY MOUTH EVERY DAY 02/26/18   Rodolph Bong, MD  multivitamin (ONE-A-DAY MEN'S) TABS tablet Take 1 tablet by mouth daily. 12/04/15   Danelle Berry, PA-C    Allergies    Patient has no known allergies.  Review of Systems   Review of Systems  Musculoskeletal: Positive for arthralgias.  Skin: Positive for wound.  Neurological: Negative for weakness and numbness.  All other systems reviewed and are negative.   Physical Exam Updated Vital Signs BP (!) 139/84 (BP Location: Right Arm)   Pulse 72   Temp 98 F (36.7 C) (Oral)   Resp 18   Wt  68 kg   SpO2 99%   BMI 20.91 kg/m   Physical Exam Vitals and nursing note reviewed.  Constitutional:      General: He is not in acute distress.    Appearance: Normal appearance. He is well-developed. He is not ill-appearing.  HENT:     Head: Normocephalic and atraumatic.  Eyes:     General: No scleral icterus.       Right eye: No discharge.        Left eye: No discharge.     Conjunctiva/sclera: Conjunctivae normal.     Pupils: Pupils are equal, round, and reactive to light.  Cardiovascular:     Rate and Rhythm: Normal rate.  Pulmonary:     Effort: Pulmonary effort is normal. No respiratory distress.  Abdominal:     General: There is no distension.  Musculoskeletal:     Cervical back: Normal range of motion.     Comments: Right wrist: The end  of the ulna is medially displaced and sticking out of a wound. It appears intact. Pt is able to move all his fingers. He has normal sensation. 2+ radial pulse  Skin:    General: Skin is warm and dry.  Neurological:     Mental Status: He is alert and oriented to person, place, and time.  Psychiatric:        Behavior: Behavior normal.         ED Results / Procedures / Treatments   Labs (all labs ordered are listed, but only abnormal results are displayed) Labs Reviewed  BASIC METABOLIC PANEL - Abnormal; Notable for the following components:      Result Value   CO2 21 (*)    Glucose, Bld 120 (*)    All other components within normal limits  CBC WITH DIFFERENTIAL/PLATELET - Abnormal; Notable for the following components:   RBC 3.72 (*)    MCV 106.5 (*)    MCH 36.3 (*)    Platelets 83 (*)    All other components within normal limits  ETHANOL - Abnormal; Notable for the following components:   Alcohol, Ethyl (B) 332 (*)    All other components within normal limits  SARS CORONAVIRUS 2 BY RT PCR (HOSPITAL ORDER, PERFORMED IN [facility])    EKG None  Radiology DG Wrist Complete Right  Result Date: 01/10/2020 CLINICAL DATA:  Open fracture. Fall onto outstretched arm on concrete today. EXAM: RIGHT WRIST - COMPLETE 3+ VIEW COMPARISON:  None. FINDINGS: Severely displaced distal radius fracture primarily through the metaphysis. There is 2.5 cm radial displacement of distal fracture fragment. Fracture is comminuted with small adjacent fracture fragments. There is proximal migration of the distal fracture fragment which remains congruent with the carpal bones. Disruption of the distal radioulnar joint. Possible ulna styloid fracture. The ulna appears to protrude through the skin and is exposed to air. Edema and soft tissue air in the adjacent soft tissues. IMPRESSION: 1. Severely displaced and comminuted distal radius fracture with proximal migration of the distal fracture  fragment. Carpal bones remain aligned with the distal radius fragment. Disruption of the distal radioulnar joint. 2. The ulna protrudes through the skin and is exposed to air. Possible ulna styloid fracture. Electronically Signed   By: Narda Rutherford M.D.   On: 01/10/2020 17:20   DG MINI C-ARM IMAGE ONLY  Result Date: 01/10/2020 There is no interpretation for this exam.  This order is for images obtained during a surgical procedure.  Please See "Surgeries" Tab  for more information regarding the procedure.    Procedures Procedures (including critical care time)  CRITICAL CARE Performed by: Bethel Born   Total critical care time: 35 minutes  Critical care time was exclusive of separately billable procedures and treating other patients.  Critical care was necessary to treat or prevent imminent or life-threatening deterioration.  Critical care was time spent personally by me on the following activities: development of treatment plan with patient and/or surrogate as well as nursing, discussions with consultants, evaluation of patient's response to treatment, examination of patient, obtaining history from patient or surrogate, ordering and performing treatments and interventions, ordering and review of laboratory studies, ordering and review of radiographic studies, pulse oximetry and re-evaluation of patient's condition.   Medications Ordered in ED Medications  oxyCODONE (Oxy IR/ROXICODONE) immediate release tablet 5 mg (has no administration in time range)    Or  oxyCODONE (ROXICODONE) 5 MG/5ML solution 5 mg (has no administration in time range)  HYDROmorphone (DILAUDID) injection 0.25-0.5 mg (has no administration in time range)  promethazine (PHENERGAN) injection 6.25-12.5 mg (has no administration in time range)  acetaminophen (OFIRMEV) IV 1,000 mg (has no administration in time range)  ketorolac (TORADOL) 30 MG/ML injection 30 mg (has no administration in time range)  sodium  chloride irrigation 0.9 % (1,000 mLs Irrigation Given 01/10/20 2039)  IRRISEPT - 0.05% chlorhexedine in sterile water for irrigation (450 mLs Irrigation Given 01/10/20 2041)  bupivacaine (PF) (MARCAINE) 0.25 % injection (30 mLs Infiltration Given 01/10/20 2049)  ceFAZolin (ANCEF) IVPB 2g/100 mL premix (0 g Intravenous Stopped 01/10/20 1859)  Tdap (BOOSTRIX) injection 0.5 mL (0.5 mLs Intramuscular Given 01/10/20 1725)  fentaNYL (SUBLIMAZE) injection 50 mcg (50 mcg Intravenous Given 01/10/20 1720)  gentamicin (GARAMYCIN) 170 mg in dextrose 5 % 50 mL IVPB (170 mg Intravenous New Bag/Given 01/10/20 2015)  acetaminophen (OFIRMEV) IV 1,000 mg (1,000 mg Intravenous Given 01/10/20 2055)    ED Course  I have reviewed the triage vital signs and the nursing notes.  Pertinent labs & imaging results that were available during my care of the patient were reviewed by me and considered in my medical decision making (see chart for details).  33:51 PM 59 year old who presents with a fall, ETOH intoxication, and open R wrist injury. Pt's ulna is visible and appears intact. He is N/V intact - able to move all his fingers, denies paresthesias, and has a radial pulse. Labs, xray, COVID test ordered. Tdap updated and 2g Ancef ordered.  Xray shows "severely displaced and comminuted distal radius fracture with proximal migration of the distal fracture fragment. Disruption of the distal radioulnar joint. The ulna protrudes through the skin and is exposed to air. Possible ulna styloid fracture"  Discussed with Dr. Amanda Pea who will take pt to the OR.  7:30PM Pt to OR  MDM Rules/Calculators/A&P                           Final Clinical Impression(s) / ED Diagnoses Final diagnoses:  Type III open fracture of distal end of right radius, unspecified fracture morphology, initial encounter    Rx / DC Orders ED Discharge Orders    None       Bethel Born, PA-C 01/10/20 2124    Geoffery Lyons, MD 01/10/20 2215

## 2020-01-10 NOTE — Progress Notes (Signed)
Patient admitted to the floor from PACU. MEWS is documented yellow in PACU. Patient is alert and oriented. No signs of distress. Will continue to monitor.

## 2020-01-10 NOTE — H&P (Signed)
Richard Andrews is an 59 y.o. male.   Chief Complaint: Open fracture right distal radius with dislocation of the radiocarpal joint  HPI: Patient presents with a open fracture of the right distal radius and ulna dislocation about the radiocarpal joint.  The patient has pain which is notable and significant.  Patient has no signs of elbow or upper arm trauma in the right upper extremity.  I have reviewed this with him at length and the findings.  At present juncture he denies other issues or problems.  He specifically denies neck back chest or abdominal pain.  He notes no lower extremity pain complaints.  He states that he stepped over a chair and the resultant injury occurred.  He is drank a pint today but is certainly lucid.  He has a long history of excessive alcohol intake as well as a smoking history which is significant.    Past Medical History:  Diagnosis Date  . Allergy   . Distal radius fracture, left   . Hypertension     Past Surgical History:  Procedure Laterality Date  . OPEN REDUCTION INTERNAL FIXATION (ORIF) DISTAL RADIAL FRACTURE Left 01/28/2015   Procedure: OPEN REDUCTION INTERNAL FIXATION (ORIF) LEFT DISTAL RADIAL FRACTURE;  Surgeon: Tarry Kos, MD;  Location: [facility];  Service: Orthopedics;  Laterality: Left;  . TONSILLECTOMY      Family History  Problem Relation Age of Onset  . Heart disease Mother   . Hyperlipidemia Mother    Social History:  reports that he has been smoking cigarettes. He has a 10.00 pack-year smoking history. His smokeless tobacco use includes chew. He reports current alcohol use of about 20.0 standard drinks of alcohol per week. He reports that he does not use drugs.  Allergies: No Known Allergies  (Not in a hospital admission)   Results for orders placed or performed during the hospital encounter of 01/10/20 (from the past 48 hour(s))  Basic metabolic panel     Status: Abnormal   Collection Time: 01/10/20  5:00 PM   Result Value Ref Range   Sodium 141 135 - 145 mmol/L   Potassium 3.6 3.5 - 5.1 mmol/L   Chloride 106 98 - 111 mmol/L   CO2 21 (L) 22 - 32 mmol/L   Glucose, Bld 120 (H) 70 - 99 mg/dL    Comment: Glucose reference range applies only to samples taken after fasting for at least 8 hours.   BUN 7 6 - 20 mg/dL   Creatinine, Ser 2.02 0.61 - 1.24 mg/dL   Calcium 8.9 8.9 - 54.2 mg/dL   GFR calc non Af Amer >60 >60 mL/min   GFR calc Af Amer >60 >60 mL/min   Anion gap 14 5 - 15    Comment: Performed at [facility], [address] [place], [name] [address]  CBC with Differential     Status: Abnormal   Collection Time: 01/10/20  5:00 PM  Result Value Ref Range   WBC 5.2 4.0 - 10.5 K/uL   RBC 3.72 (L) 4.22 - 5.81 MIL/uL   Hemoglobin 13.5 13.0 - 17.0 g/dL   HCT 76.2 39 - 52 %   MCV 106.5 (H) 80.0 - 100.0 fL   MCH 36.3 (H) 26.0 - 34.0 pg   MCHC 34.1 30.0 - 36.0 g/dL   RDW 83.1 51.7 - 61.6 %   Platelets 83 (L) 150 - 400 K/uL    Comment: PLATELET COUNT CONFIRMED BY SMEAR SPECIMEN CHECKED FOR CLOTS  Immature Platelet Fraction may be clinically indicated, consider ordering this additional test [id number]    nRBC 0.0 0.0 - 0.2 %   Neutrophils Relative % 72 %   Neutro Abs 3.8 1.7 - 7.7 K/uL   Lymphocytes Relative 16 %   Lymphs Abs 0.8 0.7 - 4.0 K/uL   Monocytes Relative 10 %   Monocytes Absolute 0.5 0 - 1 K/uL   Eosinophils Relative 1 %   Eosinophils Absolute 0.0 0 - 0 K/uL   Basophils Relative 1 %   Basophils Absolute 0.0 0 - 0 K/uL   Immature Granulocytes 0 %   Abs Immature Granulocytes 0.02 0.00 - 0.07 K/uL    Comment: Performed at [facility], [address], [place], [name] [address]  Ethanol     Status: Abnormal   Collection Time: 01/10/20  5:00 PM  Result Value Ref Range   Alcohol, Ethyl (B) 332 (HH) <10 mg/dL    Comment: CRITICAL RESULT CALLED TO, READ BACK BY AND VERIFIED WITH: P.PULLIUM RN 1754 01/10/20 MCCORMICK K (NOTE) Lowest detectable limit for serum  alcohol is 10 mg/dL.  For medical purposes only. Performed at [facility], [address], [place], [name] [address]    DG Wrist Complete Right  Result Date: 01/10/2020 CLINICAL DATA:  Open fracture. Fall onto outstretched arm on concrete today. EXAM: RIGHT WRIST - COMPLETE 3+ VIEW COMPARISON:  None. FINDINGS: Severely displaced distal radius fracture primarily through the metaphysis. There is 2.5 cm radial displacement of distal fracture fragment. Fracture is comminuted with small adjacent fracture fragments. There is proximal migration of the distal fracture fragment which remains congruent with the carpal bones. Disruption of the distal radioulnar joint. Possible ulna styloid fracture. The ulna appears to protrude through the skin and is exposed to air. Edema and soft tissue air in the adjacent soft tissues. IMPRESSION: 1. Severely displaced and comminuted distal radius fracture with proximal migration of the distal fracture fragment. Carpal bones remain aligned with the distal radius fragment. Disruption of the distal radioulnar joint. 2. The ulna protrudes through the skin and is exposed to air. Possible ulna styloid fracture. Electronically Signed   By: Narda Rutherford M.D.   On: 01/10/2020 17:20    Review of Systems  Respiratory: Negative.   Cardiovascular: Negative.     There were no vitals taken for this visit. Physical Exam  Open fracture dislocation right wrist.  He has refill to the hand.  Is difficult to get an examination due to the severity of the injury and abnormality.  I reviewed this with him at length and the findings.  He is here today with his daughter.  Lower extremity examination is stable without injury.  Left upper extremity has IV access and is stable.  His chest is clear.  He denies visual change nausea vomiting or other issues.  He has not been vaccinated.  He denies exposure to the coronavirus recently.  I reviewed this with him at length and the  findings. Assessment/Plan We will plan for irrigation debridement right wrist.  Our main goal is to prevent infection with an aggressive debridement as soon as humanly possible.  Unfortunately we have had a delay in his coronavirus testing.  I discussed this with nursing staff and ER physicians.  At present juncture I have spoken with the operating room in regards to our policies to try to get him as soon as possible to the operative theater.  My plan is irrigation and debridement and  stabilization of the wrist is necessary.  The patient understands this.  We will move forward as soon as humanly possible.  Risk of infection bleeding anesthesia damage to normal structures and failure of surgery to conscious intended goals of leaving symptoms and restoring function were extensively discussed with the patient.  I have also extensively discussed with the patient poor outcome associated with open fractures of this magnitude as well as chronic pain and other issues.  With this in mind he desires to proceed.  I will ask medicine service to see and treat as he is highly likely to have some degree of alcohol withdrawal.  It is not unusual for patients with his situation to have fairly significant nutritional deficiencies which can also decrease the ability for the bone to heal.  Karen Chafe, MD 01/10/2020, 6:42 PM

## 2020-01-10 NOTE — Op Note (Signed)
Operative note 01/10/2020  Richard Andrews  Preoperative diagnosis open right wrist fracture.  This was a distal radius fracture comminuted complex greater than 5 part open with extrusion of the radial shaft and the distal ulna.  This patient has a open TFC and ulnar styloid injury as well as an open radiocarpal joint.  Postop diagnosis: The same  Operative procedure: #1 irrigation debridement open fracture right wrist including open radiocarpal joint, radius fracture and distal ulna fracture.  This was an excisional debridement with curette knife and scissor.  #2 open reduction internal fixation right radius fracture with DVR plate and screw construct #3 open carpal tunnel release right wrist #4 fasciotomy right forearm #5 open TFC repair right wrist #6 5 view x-ray series right wrist  Surgeon Richard Andrews  Anesthesia General  Tourniquet time less than 2 hours  Estimated blood loss 150 cc or less  Drains 1 Hemovac drain  Description of procedure this patient is a pleasant male who fell today sustaining the above-mentioned injury.  This a very significant injury open in nature.  This is a type II open fracture.  He was quickly taken to the operative theater as soon as we can get coronavirus testing etc.  In the operative theater he underwent a general anesthetic.  Once in the operative theater I performed a irrigation and debridement with an initial prescrub of 5 separate Hibiclens scrubs utilizing a Hibiclens scrub brush.  Following this I then performed a 10-minute surgical Betadine scrub and paint.  Once this was complete drapes were placed outlined marks were made timeout was observed and the operation commenced with elevation of the tourniquet and extension of the incision about the open injury.  Interoperative photos were taken I then performed irrigation and debridement of skin subtenons tissue bone tendon and associated soft tissue structures.  The patient tolerated this well curette  knife and scissor use.  The patient did not have excessive contamination there was no grafts or other unusual issues notable.  I was however very diligent in my debridement.  6 L of saline followed by bottle of Aricept solution was accomplished.  Once this was completed I then reduced the area somewhat and then made a volar radial approach to the wrist.  A volar radial approach was made FCR tendon was dissected and released followed by fasciotomy of the volar forearm.  Once this was completed I incised the pronator and performed a reduction.  The reduction was accomplished held with a K wire and following this a DVR plate and screw construct was placed without difficulty.  I was able achieve adequate alignment.  Following this I continued irrigation.  Following this I turned attention back towards the distal ulna and performed to drill holes followed by placement of a FiberWire suture through the drill holes into the TFC and then back through the drill hole with a classic TFC repair utilizing bony drill hole technique.  This was a TFC repair with bony drill hole technique and performed without difficulty.  There were no complicating features.  Once this was corrected I then turned attention back towards the palm where an open carpal tunnel release was performed.  1 inch incision was made dissection was carried down fascia was incised followed by release of the transverse carpal ligament.  The transverse carpal ligament was incised and released without difficulty.  This was a carpal tunnel release without difficulty.  Thus ORIF of the radius, TFC repair and ORIF of the radius was performed.  X-rays looked excellent there were no complicating features.  The patient did strip the ECU tendon but I did not want to do anything other than allow this to reset itself in the groove.  The patient tolerated this well.  We irrigated additionally with another bottle of Aricept solution and saline followed by  closure of the wounds with Prolene.  I did tack the pronator down with Vicryl in the radius wound.  There were 3 separate incisions.  There were no complications.  He had soft compartments and a warm pink and the conclusion of the procedure.  We will monitor him in the recovery room and he will be admitted for IV antibiotics general postop observation and other measures.  All questions have been encouraged and answered.  Khiem Gargis MD

## 2020-01-10 NOTE — ED Triage Notes (Signed)
Pt tripped over a chair and fell onto concrete, he tried to catch himself with his hands out in front of him & his Right resulted in an injury. Pt reports that his bone is stiking out of the skin, he doe shave a dry dressing over the injury.

## 2020-01-10 NOTE — Consult Note (Signed)
[facility] Medical Consultation  LETRELL ATTWOOD WLN:[id number] DOB: 26-Feb-1961 DOA: 01/10/2020 PCP: Rodolph Bong, MD   Requesting physician: Dr. Amanda Pea. Date of consultation: January 10, 2020. Reason for consultation: Alcohol dependence.  Impression/Recommendations Principal Problem:   Alcohol dependence (HCC) Active Problems:   Essential hypertension   Macrocytosis   Open Colles' fracture of right radius    1. Alcohol dependence -we will place patient on CIWA and closely monitor.  Thiamine.  Advised about quitting. 2. Right distal radial fracture being managed by Dr. Amanda Pea.  Patient is on empiric antibiotics.  Has had surgery. 3. Macrocytosis likely from alcoholism.  Check anemia panel with next blood draw.  I will followup again tomorrow. Please contact me if I can be of assistance in the meanwhile. Thank you for this consultation.  Chief Complaint: Fall.  HPI:  History of alcoholism and tobacco abuse tripped on a chair and fell onto the floor and fractured his right forearm.  Was brought to the ER patient underwent surgery.  Labs show bicarb of 21 MCV of 106.5.  Covid test was negative alcohol level was 332.  Denies any chest pain or shortness of breath denies hitting his head or losing consciousness.  Review of Systems:  As presently history of present illness nothing else significant.  Past Medical History:  Diagnosis Date  . Allergy   . Distal radius fracture, left   . Hypertension    Past Surgical History:  Procedure Laterality Date  . OPEN REDUCTION INTERNAL FIXATION (ORIF) DISTAL RADIAL FRACTURE Left 01/28/2015   Procedure: OPEN REDUCTION INTERNAL FIXATION (ORIF) LEFT DISTAL RADIAL FRACTURE;  Surgeon: Tarry Kos, MD;  Location: [facility];  Service: Orthopedics;  Laterality: Left;  . TONSILLECTOMY     Social History:  reports that he has been smoking cigarettes. He has a 10.00 pack-year smoking history. His smokeless tobacco use includes chew.  He reports current alcohol use of about 20.0 standard drinks of alcohol per week. He reports that he does not use drugs.  No Known Allergies Family History  Problem Relation Age of Onset  . Heart disease Mother   . Hyperlipidemia Mother     Prior to Admission medications   Medication Sig Start Date End Date Taking? Authorizing Provider  ibuprofen (ADVIL) 200 MG tablet Take 400 mg by mouth See admin instructions. Take two tablets (400 mg) by mouth in the morning on work days   Yes [provider]  [facility] B-1 100 MG tablet TAKE 1 TABLET BY MOUTH EVERY DAY Patient not taking: Reported on 01/10/2020 03/27/17   Rodolph Bong, MD  folic acid (FOLVITE) 1 MG tablet TAKE 1 TABLET BY MOUTH EVERY DAY Patient not taking: Reported on 01/10/2020 10/30/17   Rodolph Bong, MD  lactulose, encephalopathy, (CHRONULAC) 10 GM/15ML SOLN Take 45 mLs (30 g total) by mouth 3 (three) times daily. Patient not taking: Reported on 01/10/2020 01/26/17   Rodolph Bong, MD  lisinopril-hydrochlorothiazide (PRINZIDE,ZESTORETIC) 10-12.5 MG tablet TAKE 1 TABLET BY MOUTH EVERY DAY Patient not taking: Reported on 01/10/2020 02/26/18   Rodolph Bong, MD  multivitamin (ONE-A-DAY MEN'S) TABS tablet Take 1 tablet by mouth daily. Patient not taking: Reported on 01/10/2020 12/04/15   Danelle Berry, PA-C   Physical Exam: Blood pressure (!) 146/92, pulse 63, temperature (!) 97 F (36.1 C), resp. rate (!) 11, weight 68 kg, SpO2 92 %. Vitals:   01/10/20 2250 01/10/20 2300  BP: (!) 146/92   Pulse: 73 63  Resp: 12 (!) 11  Temp:    SpO2: 100% 92%     General: Moderately built and nourished.  Eyes: Anicteric no pallor.  ENT: No discharge from the ears eyes nose or mouth.  Neck: No mass felt.  No neck rigidity.  Cardiovascular: S1-S2 heard.  Respiratory: No rhonchi or crepitations.  Abdomen: Soft nontender bowel sounds present.  Skin: No edema.  Musculoskeletal: Right forearm is in dressing.  Psychiatric: Appears  normal.  Normal affect.  Neurologic: Alert awake oriented to time place and person.  Moves all extremities.  Labs on Admission:  Basic Metabolic Panel: Recent Labs  Lab 01/10/20 1700  NA 141  K 3.6  CL 106  CO2 21*  GLUCOSE 120*  BUN 7  CREATININE 0.93  CALCIUM 8.9   Liver Function Tests: No results for input(s): AST, ALT, ALKPHOS, BILITOT, PROT, ALBUMIN in the last 168 hours. No results for input(s): LIPASE, AMYLASE in the last 168 hours. No results for input(s): AMMONIA in the last 168 hours. CBC: Recent Labs  Lab 01/10/20 1700  WBC 5.2  NEUTROABS 3.8  HGB 13.5  HCT 39.6  MCV 106.5*  PLT 83*   Cardiac Enzymes: No results for input(s): CKTOTAL, CKMB, CKMBINDEX, TROPONINI in the last 168 hours. BNP: Invalid input(s): POCBNP CBG: No results for input(s): GLUCAP in the last 168 hours.  Radiological Exams on Admission: DG Wrist Complete Right  Result Date: 01/10/2020 CLINICAL DATA:  Open fracture. Fall onto outstretched arm on concrete today. EXAM: RIGHT WRIST - COMPLETE 3+ VIEW COMPARISON:  None. FINDINGS: Severely displaced distal radius fracture primarily through the metaphysis. There is 2.5 cm radial displacement of distal fracture fragment. Fracture is comminuted with small adjacent fracture fragments. There is proximal migration of the distal fracture fragment which remains congruent with the carpal bones. Disruption of the distal radioulnar joint. Possible ulna styloid fracture. The ulna appears to protrude through the skin and is exposed to air. Edema and soft tissue air in the adjacent soft tissues. IMPRESSION: 1. Severely displaced and comminuted distal radius fracture with proximal migration of the distal fracture fragment. Carpal bones remain aligned with the distal radius fragment. Disruption of the distal radioulnar joint. 2. The ulna protrudes through the skin and is exposed to air. Possible ulna styloid fracture. Electronically Signed   By: Narda Rutherford  M.D.   On: 01/10/2020 17:20   DG MINI C-ARM IMAGE ONLY  Result Date: 01/10/2020 There is no interpretation for this exam.  This order is for images obtained during a surgical procedure.  Please See "Surgeries" Tab for more information regarding the procedure.     Time spent: 50 minutes.  Eduard Clos [facility]  If 7PM-7AM, please contact night-coverage [email or web address] Password [email or web address] 01/10/2020, 11:04 PM

## 2020-01-10 NOTE — Transfer of Care (Signed)
Immediate Anesthesia Transfer of Care Note  Patient: Richard Andrews  Procedure(s) Performed: ORIF RIGHT WRIST (Right )  Patient Location: PACU  Anesthesia Type:General  Level of Consciousness: oriented, sedated and patient cooperative  Airway & Oxygen Therapy: Patient Spontanous Breathing and Patient connected to nasal cannula oxygen  Post-op Assessment: Report given to RN, Post -op Vital signs reviewed and stable and Patient moving all extremities X 4  Post vital signs: Reviewed and stable  Last Vitals:  Vitals Value Taken Time  BP    Temp    Pulse 88 01/10/20 2217  Resp 10 01/10/20 2217  SpO2 98 % 01/10/20 2217  Vitals shown include unvalidated device data.  Last Pain:  Vitals:   01/10/20 1904  TempSrc: Oral  PainSc:          Complications: No complications documented.

## 2020-01-10 NOTE — Anesthesia Preprocedure Evaluation (Addendum)
Anesthesia Evaluation  Patient identified by MRN, date of birth, ID band Patient awake    Reviewed: Allergy & Precautions, NPO status , Patient's Chart, lab work & pertinent test results  Airway Mallampati: II  TM Distance: >3 FB Neck ROM: Full    Dental  (+) Poor Dentition, Missing   Pulmonary Current SmokerPatient did not abstain from smoking.,    Pulmonary exam normal breath sounds clear to auscultation       Cardiovascular hypertension, Pt. on medications Normal cardiovascular exam Rhythm:Regular Rate:Normal     Neuro/Psych PSYCHIATRIC DISORDERS negative neurological ROS     GI/Hepatic negative GI ROS, (+)     substance abuse  alcohol use,   Endo/Other  negative endocrine ROS  Renal/GU negative Renal ROS     Musculoskeletal negative musculoskeletal ROS (+)   Abdominal   Peds  Hematology Thrombocytopenia   Anesthesia Other Findings arm laceration, left  Reproductive/Obstetrics                           Anesthesia Physical Anesthesia Plan  ASA: III and emergent  Anesthesia Plan: General   Post-op Pain Management:    Induction: Intravenous  PONV Risk Score and Plan: 3 and Ondansetron, Dexamethasone, Midazolam and Treatment may vary due to age or medical condition  Airway Management Planned: Oral ETT  Additional Equipment:   Intra-op Plan:   Post-operative Plan: Extubation in OR  Informed Consent: I have reviewed the patients History and Physical, chart, labs and discussed the procedure including the risks, benefits and alternatives for the proposed anesthesia with the patient or authorized representative who has indicated his/her understanding and acceptance.     Dental advisory given  Plan Discussed with: CRNA  Anesthesia Plan Comments:       Anesthesia Quick Evaluation

## 2020-01-11 ENCOUNTER — Other Ambulatory Visit: Payer: Self-pay

## 2020-01-11 DIAGNOSIS — F1020 Alcohol dependence, uncomplicated: Secondary | ICD-10-CM

## 2020-01-11 LAB — COMPREHENSIVE METABOLIC PANEL
ALT: 39 U/L (ref 0–44)
AST: 56 U/L — ABNORMAL HIGH (ref 15–41)
Albumin: 3.7 g/dL (ref 3.5–5.0)
Alkaline Phosphatase: 40 U/L (ref 38–126)
Anion gap: 14 (ref 5–15)
BUN: 6 mg/dL (ref 6–20)
CO2: 20 mmol/L — ABNORMAL LOW (ref 22–32)
Calcium: 8.2 mg/dL — ABNORMAL LOW (ref 8.9–10.3)
Chloride: 105 mmol/L (ref 98–111)
Creatinine, Ser: 0.72 mg/dL (ref 0.61–1.24)
GFR calc Af Amer: >60 mL/min (ref >60)
GFR calc non Af Amer: >60 mL/min (ref >60)
Glucose, Bld: 152 mg/dL — ABNORMAL HIGH (ref 70–99)
Potassium: 3.4 mmol/L — ABNORMAL LOW (ref 3.5–5.1)
Sodium: 139 mmol/L (ref 135–145)
Total Bilirubin: 1 mg/dL (ref 0.3–1.2)
Total Protein: 7.2 g/dL (ref 6.5–8.1)

## 2020-01-11 LAB — CBC
HCT: 36.4 % — ABNORMAL LOW (ref 39.0–52.0)
Hemoglobin: 12.5 g/dL — ABNORMAL LOW (ref 13.0–17.0)
MCH: 35.9 pg — ABNORMAL HIGH (ref 26.0–34.0)
MCHC: 34.3 g/dL (ref 30.0–36.0)
MCV: 104.6 fL — ABNORMAL HIGH (ref 80.0–100.0)
Platelets: 70 10*3/uL — ABNORMAL LOW (ref 150–400)
RBC: 3.48 MIL/uL — ABNORMAL LOW (ref 4.22–5.81)
RDW: 11.8 % (ref 11.5–15.5)
WBC: 4.7 10*3/uL (ref 4.0–10.5)
nRBC: 0 % (ref 0.0–0.2)

## 2020-01-11 LAB — MAGNESIUM: Magnesium: 1.2 mg/dL — ABNORMAL LOW (ref 1.7–2.4)

## 2020-01-11 MED ORDER — ACETAMINOPHEN 500 MG PO TABS
1000.0000 mg | ORAL_TABLET | Freq: Four times a day (QID) | ORAL | Status: AC
  Administered 2020-01-11 (×3): 1000 mg via ORAL
  Filled 2020-01-11 (×3): qty 2

## 2020-01-11 MED ORDER — PROMETHAZINE HCL 12.5 MG RE SUPP: 12.5000 mg | Freq: Four times a day (QID) | RECTAL | Status: DC | PRN

## 2020-01-11 MED ORDER — OXYCODONE HCL 5 MG PO TABS
10.0000 mg | ORAL_TABLET | ORAL | Status: DC | PRN
  Administered 2020-01-11 – 2020-01-12 (×3): 10 mg via ORAL
  Administered 2020-01-12: 15 mg via ORAL
  Filled 2020-01-11 (×3): qty 2
  Filled 2020-01-11: qty 3

## 2020-01-11 MED ORDER — FAMOTIDINE 20 MG PO TABS: 20.0000 mg | ORAL_TABLET | Freq: Two times a day (BID) | ORAL | Status: DC | PRN

## 2020-01-11 MED ORDER — ACETAMINOPHEN 325 MG PO TABS: 325.0000 mg | ORAL_TABLET | Freq: Four times a day (QID) | ORAL | Status: DC | PRN

## 2020-01-11 MED ORDER — MAGNESIUM SULFATE 4 GM/100ML IV SOLN
4.0000 g | Freq: Once | INTRAVENOUS | Status: AC
  Administered 2020-01-11: 4 g via INTRAVENOUS
  Filled 2020-01-11: qty 100

## 2020-01-11 MED ORDER — ONDANSETRON HCL 4 MG PO TABS: 4.0000 mg | ORAL_TABLET | Freq: Four times a day (QID) | ORAL | Status: DC | PRN

## 2020-01-11 MED ORDER — METHOCARBAMOL 500 MG PO TABS
500.0000 mg | ORAL_TABLET | Freq: Four times a day (QID) | ORAL | Status: DC | PRN
  Administered 2020-01-11: 500 mg via ORAL
  Filled 2020-01-11: qty 1

## 2020-01-11 MED ORDER — CEFAZOLIN SODIUM-DEXTROSE 1-4 GM/50ML-% IV SOLN: 1.0000 g | INTRAVENOUS | Status: DC

## 2020-01-11 MED ORDER — ASCORBIC ACID 500 MG PO TABS
1000.0000 mg | ORAL_TABLET | Freq: Every day | ORAL | Status: DC
  Administered 2020-01-11 – 2020-01-12 (×2): 1000 mg via ORAL
  Filled 2020-01-11 (×2): qty 2

## 2020-01-11 MED ORDER — GENTAMICIN SULFATE 40 MG/ML IJ SOLN
480.0000 mg | INTRAVENOUS | Status: DC
  Administered 2020-01-11 – 2020-01-13 (×3): 480 mg via INTRAVENOUS
  Filled 2020-01-11 (×3): qty 12

## 2020-01-11 MED ORDER — ONDANSETRON HCL 4 MG/2ML IJ SOLN: 4.0000 mg | Freq: Four times a day (QID) | INTRAMUSCULAR | Status: DC | PRN

## 2020-01-11 MED ORDER — DOCUSATE SODIUM 100 MG PO CAPS
100.0000 mg | ORAL_CAPSULE | Freq: Two times a day (BID) | ORAL | Status: DC
  Administered 2020-01-11 – 2020-01-12 (×4): 100 mg via ORAL
  Filled 2020-01-11 (×4): qty 1

## 2020-01-11 MED ORDER — METHOCARBAMOL 1000 MG/10ML IJ SOLN: 500.0000 mg | Freq: Four times a day (QID) | INTRAVENOUS | Status: DC | PRN

## 2020-01-11 MED ORDER — CEFAZOLIN SODIUM-DEXTROSE 1-4 GM/50ML-% IV SOLN
1.0000 g | Freq: Three times a day (TID) | INTRAVENOUS | Status: DC
  Administered 2020-01-11 – 2020-01-13 (×7): 1 g via INTRAVENOUS
  Filled 2020-01-11 (×7): qty 50

## 2020-01-11 MED ORDER — HYDROMORPHONE HCL 1 MG/ML IJ SOLN: 0.5000 mg | INTRAMUSCULAR | Status: DC | PRN

## 2020-01-11 MED ORDER — LACTATED RINGERS IV SOLN: INTRAVENOUS | Status: DC

## 2020-01-11 MED ORDER — OXYCODONE HCL 5 MG PO TABS: 5.0000 mg | ORAL_TABLET | ORAL | Status: DC | PRN

## 2020-01-11 MED ORDER — POTASSIUM CHLORIDE CRYS ER 20 MEQ PO TBCR
40.0000 meq | EXTENDED_RELEASE_TABLET | Freq: Once | ORAL | Status: AC
  Administered 2020-01-11: 40 meq via ORAL
  Filled 2020-01-11: qty 2

## 2020-01-11 NOTE — Progress Notes (Signed)
Pharmacy Antibiotic Note  Richard Andrews is a 59 y.o. male admitted on 01/10/2020 with open right wrist fracture s/p repair.  Pharmacy has been consulted for gentamicin dosing.  Plan: Gentamicin 480 mg IV q24h  Weight: 68 kg (149 lb 14.6 oz)  Temp (24hrs), Avg:97.4 F (36.3 C), Min:97 F (36.1 C), Max:98 F (36.7 C)  Recent Labs  Lab 01/10/20 1700  WBC 5.2  CREATININE 0.93    CrCl cannot be calculated (Unknown ideal weight.).    No Known Allergies   Richard Andrews 01/11/2020 12:33 AM

## 2020-01-11 NOTE — Anesthesia Postprocedure Evaluation (Signed)
Anesthesia Post Note  Patient: Richard Andrews  Procedure(s) Performed: ORIF RIGHT WRIST (Right )     Patient location during evaluation: PACU Anesthesia Type: General Level of consciousness: awake Pain management: pain level controlled Vital Signs Assessment: post-procedure vital signs reviewed and stable Respiratory status: spontaneous breathing, nonlabored ventilation, respiratory function stable and patient connected to nasal cannula oxygen Cardiovascular status: blood pressure returned to baseline and stable Postop Assessment: no apparent nausea or vomiting Anesthetic complications: no   No complications documented.  Last Vitals:  Vitals:   01/10/20 2340 01/11/20 0000  BP: (!) 143/87 (!) 158/93  Pulse: 61 68  Resp: 12 18  Temp: (!) 36.2 C 36.4 C  SpO2: 96% 98%    Last Pain:  Vitals:   01/11/20 0018  TempSrc:   PainSc: 3                  Brydan Downard P Isaly Fasching

## 2020-01-11 NOTE — Progress Notes (Signed)
PROGRESS NOTE    Richard Andrews  UDJ:[id number] DOB: 1961/03/07 DOA: 01/10/2020 PCP: Rodolph Bong, MD   Brief Narrative:  Patient is a 59 year old male with history of chronic alcoholism, tobacco abuse who presented to the emergency department after he tripped on a chair and fell on the floor fracturing his right forearm.  Alcohol level was 332 on presentation.  Imaging showed open fracture right distal radius with dislocation of the radiocarpal joint.  He was admitted under orthopedic service and he underwent irrigation debridement of the open fracture and open reduction with internal fixation.  Medicine was consulted for medical management including possible alcohol withdrawal.  Currently on CIWA protocol.   Assessment & Plan:   Principal Problem:   Alcohol dependence (HCC) Active Problems:   Essential hypertension   Macrocytosis   Open Colles' fracture of right radius    Right distal open radius fracture: X-ray findings as above.  Underwent  irrigation debridement of the open fracture and open reduction with internal fixation.  Management as per orthopedics.PT/OT consulted. Orthopedics planning to continue antibiotics till Monday.  Chronic alcohol abuse/alcohol intoxication: Alcohol level was 300s on presentation.  Monitor for withdrawal.  Start on CIWA protocol.  Continue thiamine  Macrocytosis: Secondary to alcoholism.  Continue folic acid  Hypokalemia: Being supplemented with potassium.  Magnesium also supplemented.  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  Stable.  Continue to monitor         DVT prophylaxis:SCD Code Status: Full   Procedures:As above  Antimicrobials:  Anti-infectives (From admission, onward)   Start     Dose/Rate Route Frequency Ordered Stop   01/11/20 0400  ceFAZolin (ANCEF) IVPB 1 g/50 mL premix     Discontinue     1 g 100 mL/hr over 30 Minutes Intravenous Every 8 hours 01/11/20 0001      01/11/20 0400  gentamicin (GARAMYCIN) 480 mg in dextrose 5 % 100 mL IVPB     Discontinue     480 mg 112 mL/hr over 60 Minutes Intravenous Every 24 hours 01/11/20 0040     01/11/20 0015  ceFAZolin (ANCEF) IVPB 1 g/50 mL premix  Status:  Discontinued        1 g 100 mL/hr over 30 Minutes Intravenous NOW 01/11/20 0001 01/11/20 0002   01/10/20 2015  gentamicin (GARAMYCIN) 170 mg in dextrose 5 % 50 mL IVPB        170 mg 108.5 mL/hr over 30 Minutes Intravenous  Once 01/10/20 2004 01/10/20 2045   01/10/20 1615  ceFAZolin (ANCEF) IVPB 2g/100 mL premix        2 g 200 mL/hr over 30 Minutes Intravenous  Once 01/10/20 1613 01/10/20 1859      Subjective:  Patient seen and examined at the bedside this morning.  Hemodynamically stable.  Comfortable.  Denies any complaints.  Pain well controlled.  Not in withdrawal.  Wife at the bedside  Objective: Vitals:   01/10/20 2335 01/10/20 2340 01/11/20 0000 01/11/20 0438  BP: (!) 139/85 (!) 143/87 (!) 158/93 (!) 155/96  Pulse: 77 61 68 73  Resp: 17 12 18 16   Temp:  (!) 97.1 F (36.2 C) 97.6 F (36.4 C) 98 F (36.7 C)  TempSrc:   Oral Oral  SpO2: 99% 96% 98% 95%  Weight:        Intake/Output Summary (Last 24 hours) at 01/11/2020 0840 Last data filed at 01/11/2020 0555 Gross per 24 hour  Intake 1993.13 ml  Output 980 ml  Net 1013.13 ml   Filed Weights   01/10/20 2006  Weight: 68 kg    Examination:  General exam: Appears calm and comfortable ,Not in distress,average built HEENT:PERRL,Oral mucosa moist, Ear/Nose normal on gross exam Respiratory system: Bilateral equal air entry, normal vesicular breath sounds, no wheezes or crackles  Cardiovascular system: S1 & S2 heard, RRR. No JVD, murmurs, rubs, gallops or clicks. No pedal edema. Gastrointestinal system: Abdomen is nondistended, soft and nontender. No organomegaly or masses felt. Normal bowel sounds heard. Central nervous system: Alert and oriented. No focal neurological  deficits. Extremities: No edema, no clubbing ,no cyanosis, right upper extremity wrapped with dressings      Data Reviewed: I have personally reviewed following labs and imaging studies  CBC: Recent Labs  Lab 01/10/20 1700 01/11/20 0325  WBC 5.2 4.7  NEUTROABS 3.8  --   HGB 13.5 12.5*  HCT 39.6 36.4*  MCV 106.5* 104.6*  PLT 83* 70*   Basic Metabolic Panel: Recent Labs  Lab 01/10/20 1700 01/11/20 0325  NA 141 139  K 3.6 3.4*  CL 106 105  CO2 21* 20*  GLUCOSE 120* 152*  BUN 7 6  CREATININE 0.93 0.72  CALCIUM 8.9 8.2*   GFR: CrCl cannot be calculated (Unknown ideal weight.). Liver Function Tests: Recent Labs  Lab 01/11/20 0325  AST 56*  ALT 39  ALKPHOS 40  BILITOT 1.0  PROT 7.2  ALBUMIN 3.7   No results for input(s): LIPASE, AMYLASE in the last 168 hours. No results for input(s): AMMONIA in the last 168 hours. Coagulation Profile: No results for input(s): INR, PROTIME in the last 168 hours. Cardiac Enzymes: No results for input(s): CKTOTAL, CKMB, CKMBINDEX, TROPONINI in the last 168 hours. BNP (last 3 results) No results for input(s): PROBNP in the last 8760 hours. HbA1C: No results for input(s): HGBA1C in the last 72 hours. CBG: No results for input(s): GLUCAP in the last 168 hours. Lipid Profile: No results for input(s): CHOL, HDL, LDLCALC, TRIG, CHOLHDL, LDLDIRECT in the last 72 hours. Thyroid Function Tests: No results for input(s): TSH, T4TOTAL, FREET4, T3FREE, THYROIDAB in the last 72 hours. Anemia Panel: No results for input(s): VITAMINB12, FOLATE, FERRITIN, TIBC, IRON, RETICCTPCT in the last 72 hours. Sepsis Labs: No results for input(s): PROCALCITON, LATICACIDVEN in the last 168 hours.  Recent Results (from the past 240 hour(s))  SARS Coronavirus 2 by RT PCR (hospital order, performed in [facility] hospital lab) Nasopharyngeal Nasopharyngeal Swab     Status: None   Collection Time: 01/10/20  5:29 PM   Specimen: Nasopharyngeal Swab  Result  Value Ref Range Status   SARS Coronavirus 2 NEGATIVE NEGATIVE Final    Comment: (NOTE) SARS-CoV-2 target nucleic acids are NOT DETECTED.  The SARS-CoV-2 RNA is generally detectable in upper and lower respiratory specimens during the acute phase of infection. The lowest concentration of SARS-CoV-2 viral copies this assay can detect is 250 copies / mL. A negative result does not preclude SARS-CoV-2 infection and should not be used as the sole basis for treatment or other patient management decisions.  A negative result may occur with improper specimen collection / handling, submission of specimen other than nasopharyngeal swab, presence of viral mutation(s) within the areas targeted by this assay, and inadequate number of viral copies (<250 copies / mL). A negative result must be combined with clinical observations, patient history, and epidemiological information.  Fact Sheet for Patients:   BoilerBrush.com.cy  Fact Sheet for Healthcare Providers: https://pope.com/  This test is  not yet approved or  cleared by the [place] and has been authorized for detection and/or diagnosis of SARS-CoV-2 by FDA under an Emergency Use Authorization (EUA).  This EUA will remain in effect (meaning this test can be used) for the duration of the COVID-19 declaration under Section 564(b)(1) of the Act, 21 U.S.C. section 360bbb-3(b)(1), unless the authorization is terminated or revoked sooner.  Performed at [facility], [address], [place], [name] [address]          Radiology Studies: DG Wrist Complete Right  Result Date: 01/10/2020 CLINICAL DATA:  Open fracture. Fall onto outstretched arm on concrete today. EXAM: RIGHT WRIST - COMPLETE 3+ VIEW COMPARISON:  None. FINDINGS: Severely displaced distal radius fracture primarily through the metaphysis. There is 2.5 cm radial displacement of distal fracture fragment. Fracture is  comminuted with small adjacent fracture fragments. There is proximal migration of the distal fracture fragment which remains congruent with the carpal bones. Disruption of the distal radioulnar joint. Possible ulna styloid fracture. The ulna appears to protrude through the skin and is exposed to air. Edema and soft tissue air in the adjacent soft tissues. IMPRESSION: 1. Severely displaced and comminuted distal radius fracture with proximal migration of the distal fracture fragment. Carpal bones remain aligned with the distal radius fragment. Disruption of the distal radioulnar joint. 2. The ulna protrudes through the skin and is exposed to air. Possible ulna styloid fracture. Electronically Signed   By: Narda Rutherford M.D.   On: 01/10/2020 17:20   DG MINI C-ARM IMAGE ONLY  Result Date: 01/10/2020 There is no interpretation for this exam.  This order is for images obtained during a surgical procedure.  Please See "Surgeries" Tab for more information regarding the procedure.        Scheduled Meds: . acetaminophen  1,000 mg Oral Q6H  . vitamin C  1,000 mg Oral Daily  . docusate sodium  100 mg Oral BID  . folic acid  1 mg Oral Daily  . HYDROmorphone      . ketorolac      . LORazepam  0-4 mg Oral Q6H   Followed by  . [START ON 01/13/2020] LORazepam  0-4 mg Oral Q12H  . multivitamin with minerals  1 tablet Oral Daily  . potassium chloride  40 mEq Oral Once  . thiamine  100 mg Oral Daily   Or  . thiamine  100 mg Intravenous Daily   Continuous Infusions: .  ceFAZolin (ANCEF) IV 1 g (01/11/20 0410)  . gentamicin 480 mg (01/11/20 0509)  . lactated ringers 75 mL/hr at 01/11/20 0009  . methocarbamol (ROBAXIN) IV       LOS: 1 day    Time spent: 35 mins.More than 50% of that time was spent in counseling and/or coordination of care.      Burnadette Pop, MD [facility] P7/31/2021, 8:40 AM

## 2020-01-11 NOTE — Evaluation (Signed)
Physical Therapy Evaluation Patient Details Name: Richard Andrews MRN: [id number] DOB: [birth year] Today's Date: 01/11/2020   History of Present Illness  Richard Andrews is a 59y.o. male who presents after a fall resulting in open right wrist fx, s/p ORIF right wrist. PMH of HTN and alcohol dependence.   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 Pt demonstrating good pain control and able to correctly state precautions. Presents with decreased functional mobility due to balance deficits in setting of alcohol withdrawal. Ambulating 540 feet with a cane with up to min assist due to episode of loss of balance with obstacle negotiation. Will continue to follow acutely to address.     Follow Up Recommendations Outpatient PT;Supervision for mobility/OOB    Equipment Recommendations  Cane    Recommendations for Other Services   CSW consult    Precautions / Restrictions Precautions Precautions: Fall Restrictions Weight Bearing Restrictions: Yes RUE Weight Bearing: Non weight bearing Other Position/Activity Restrictions: NWB RUE      Mobility  Bed Mobility Overal bed mobility: Modified Independent             General bed mobility comments: OOB in chair  Transfers Overall transfer level: Needs assistance Equipment used: None Transfers: Sit to/from Stand Sit to Stand: Min guard         General transfer comment: minguard for safety, pt mild instability   Ambulation/Gait Ambulation/Gait assistance: Min assist Gait Distance (Feet): 540 Feet Assistive device: Straight cane Gait Pattern/deviations: Step-through pattern;Ataxic Gait velocity: decreased   General Gait Details: Pt with one episode of lateral LOB with obstacle negotiation, requiring minA to correct. Mildly ataxic gait with tremor, improved with increased distance  Stairs            Wheelchair Mobility    Modified Rankin (Stroke Patients Only)        Balance Overall balance assessment: Mild deficits observed, not formally tested                                           Pertinent Vitals/Pain Pain Assessment: No/denies pain    Home Living Family/patient expects to be discharged to:: Private residence Living Arrangements: Spouse/significant other Available Help at Discharge: Family;Available PRN/intermittently Type of Home: House Home Access: Ramped entrance     Home Layout: Two level;1/2 bath on main level        Prior Function Level of Independence: Independent         Comments: pt was independent, working as a Environmental health practitioner, driving     Higher education careers adviser   Dominant Hand: Right    Extremity/Trunk Assessment   Upper Extremity Assessment Upper Extremity Assessment: Defer to OT evaluation RUE Deficits / Details: full shoulder ROM, immobilized in splint, able to wiggle digits RUE: Unable to fully assess due to immobilization    Lower Extremity Assessment Lower Extremity Assessment: Overall WFL for tasks assessed    Cervical / Trunk Assessment Cervical / Trunk Assessment: Normal  Communication   Communication: No difficulties  Cognition Arousal/Alertness: Awake/alert Behavior During Therapy: WFL for tasks assessed/performed Overall Cognitive Status: Within Functional Limits for tasks assessed  General Comments General comments (skin integrity, edema, etc.): SpO2 >94% RA throughout session    Exercises     Assessment/Plan    PT Assessment Patient needs continued PT services  PT Problem List Decreased balance;Decreased mobility       PT Treatment Interventions DME instruction;Gait training;Functional mobility training;Therapeutic activities;Therapeutic exercise;Balance training;Patient/family education    PT Goals (Current goals can be found in the Care Plan section)  Acute Rehab PT Goals Patient Stated Goal: to go  home PT Goal Formulation: With patient Time For Goal Achievement: 01/25/20 Potential to Achieve Goals: Good    Frequency Min 5X/week   Barriers to discharge        Co-evaluation               AM-PAC PT "6 Clicks" Mobility  Outcome Measure Help needed turning from your back to your side while in a flat bed without using bedrails?: None Help needed moving from lying on your back to sitting on the side of a flat bed without using bedrails?: None Help needed moving to and from a bed to a chair (including a wheelchair)?: A Little Help needed standing up from a chair using your arms (e.g., wheelchair or bedside chair)?: A Little Help needed to walk in hospital room?: A Little Help needed climbing 3-5 steps with a railing? : A Little 6 Click Score: 20    End of Session Equipment Utilized During Treatment: Gait belt Activity Tolerance: Patient tolerated treatment well Patient left: in chair;with call bell/phone within reach;with chair alarm set Nurse Communication: Mobility status PT Visit Diagnosis: Unsteadiness on feet (R26.81);Ataxic gait (R26.0)    Time: 2878-6767 PT Time Calculation (min) (ACUTE ONLY): 22 min   Charges:   PT Evaluation $PT Eval Moderate Complexity: 1 Mod            Lillia Pauls, PT, DPT Acute Rehabilitation Services Pager [phone] Office [phone]   Norval Morton 01/11/2020, 12:47 PM

## 2020-01-11 NOTE — Plan of Care (Signed)

## 2020-01-11 NOTE — Evaluation (Signed)
Occupational Therapy Evaluation Patient Details Name: Richard Andrews MRN: [id number] DOB: 03-21-1961 Today's Date: 01/11/2020    History of Present Illness Richard Andrews is a 59y.o. male who presents after a fall resulting in open right wrist fx, s/p ORIF right wrist. PMH of HTN and alcohol dependence.    Clinical Impression   PTA, pt was living at home with his wife, pt reports he was independent with ADL/IADL and functional mobility. He was working and driving. Pt reports several falls in past 6 months but did not report number. Pt currently requires minguard for functional mobility without AD. He demonstrated full body tremors, likely due to withdrawal, throughout session impacting his safety with mobility. RN aware. Due to decline in current level of function, pt would benefit from acute OT to address established goals to facilitate safe D/C to venue listed below. At this time, recommend surgeon recommendation for follow-up. Will continue to follow acutely.     Follow Up Recommendations  Supervision/Assistance - 24 hour;Follow surgeon's recommendation for DC plan and follow-up therapies    Equipment Recommendations  3 in 1 bedside commode    Recommendations for Other Services       Precautions / Restrictions Precautions Precautions: Fall Restrictions Weight Bearing Restrictions: Yes Other Position/Activity Restrictions: NWB RUE      Mobility Bed Mobility Overal bed mobility: Modified Independent             General bed mobility comments: increased time and effort with use of grab bars  Transfers Overall transfer level: Needs assistance Equipment used: None Transfers: Sit to/from Stand Sit to Stand: Min guard         General transfer comment: minguard for safety, pt mild instability     Balance Overall balance assessment: Mild deficits observed, not formally tested                                         ADL either performed or assessed with  clinical judgement   ADL Overall ADL's : Needs assistance/impaired Eating/Feeding: Set up;Sitting   Grooming: Min guard;Standing   Upper Body Bathing: Set up;Sitting   Lower Body Bathing: Min guard;Sit to/from stand   Upper Body Dressing : Minimal assistance Upper Body Dressing Details (indicate cue type and reason): minA initially for compensatory strategies for dressing Lower Body Dressing: Min guard;Sit to/from stand   Toilet Transfer: Min guard;Ambulation Toilet Transfer Details (indicate cue type and reason): ambulated into bathroom to urinate Toileting- Clothing Manipulation and Hygiene: Min guard;Sit to/from stand       Functional mobility during ADLs: Min guard General ADL Comments: pt with withdrawal tremors during session impacting stability and safety with functional mobility;minguard for safety without AD     Vision Baseline Vision/History: No visual deficits Vision Assessment?: No apparent visual deficits     Perception     Praxis      Pertinent Vitals/Pain Pain Assessment: No/denies pain     Hand Dominance Right   Extremity/Trunk Assessment Upper Extremity Assessment Upper Extremity Assessment: RUE deficits/detail RUE Deficits / Details: full shoulder ROM, immobilized in splint, able to wiggle digits RUE: Unable to fully assess due to immobilization   Lower Extremity Assessment Lower Extremity Assessment: Defer to PT evaluation   Cervical / Trunk Assessment Cervical / Trunk Assessment: Normal   Communication Communication Communication: No difficulties   Cognition Arousal/Alertness: Awake/alert Behavior During Therapy: WFL for tasks assessed/performed  Overall Cognitive Status: Within Functional Limits for tasks assessed                                     General Comments  SpO2 >94% RA throughout session    Exercises     Shoulder Instructions      Home Living Family/patient expects to be discharged to:: Private  residence Living Arrangements: Spouse/significant other Available Help at Discharge: Family;Available PRN/intermittently Type of Home: House Home Access: Ramped entrance     Home Layout: Two level;1/2 bath on main level Alternate Level Stairs-Number of Steps: flight   Bathroom Shower/Tub: Chief Strategy Officer: Standard                Prior Functioning/Environment Level of Independence: Independent        Comments: pt was independent, working, driving        OT Problem List: Decreased activity tolerance;Impaired balance (sitting and/or standing);Decreased safety awareness;Impaired UE functional use      OT Treatment/Interventions: Self-care/ADL training;Therapeutic exercise;DME and/or AE instruction;Therapeutic activities;Patient/family education;Balance training    OT Goals(Current goals can be found in the care plan section) Acute Rehab OT Goals Patient Stated Goal: to go home OT Goal Formulation: With patient Time For Goal Achievement: 01/25/20 Potential to Achieve Goals: Good ADL Goals Pt Will Perform Grooming: with modified independence;standing Pt Will Perform Lower Body Dressing: with modified independence;sit to/from stand Pt Will Transfer to Toilet: with modified independence;ambulating Additional ADL Goal #1: Pt will demonstrate independence with 3 fall prevention strategies during ADL/IADL and functional mobility.  OT Frequency: Min 2X/week   Barriers to D/C: Decreased caregiver support  pt will be home alone at times       Co-evaluation              AM-PAC OT "6 Clicks" Daily Activity     Outcome Measure Help from another person eating meals?: A Little Help from another person taking care of personal grooming?: A Little Help from another person toileting, which includes using toliet, bedpan, or urinal?: A Little Help from another person bathing (including washing, rinsing, drying)?: A Little Help from another person to put on  and taking off regular upper body clothing?: A Little Help from another person to put on and taking off regular lower body clothing?: A Little 6 Click Score: 18   End of Session Equipment Utilized During Treatment: Gait belt Nurse Communication: Mobility status  Activity Tolerance: Patient tolerated treatment well Patient left: in chair;with call bell/phone within reach;with chair alarm set  OT Visit Diagnosis: Other abnormalities of gait and mobility (R26.89)                Time: 1100-1139 OT Time Calculation (min): 39 min Charges:  OT General Charges $OT Visit: 1 Visit OT Evaluation $OT Eval Moderate Complexity: 1 Mod OT Treatments $Self Care/Home Management : 23-37 mins  Rosey Bath OTR/L Acute Rehabilitation Services Office: [phone]   Rebeca Alert 01/11/2020, 12:40 PM

## 2020-01-11 NOTE — Progress Notes (Signed)
Patient ID: EAGLE PITTA, male   DOB: September 25, 1960, 59 y.o.   MRN: [id number] Patient seen and examined at bedside.  Patient has stable vital signs.  He is alert oriented and appropriate.  I discussed his care with nursing staff.  Patient has intact sensation and motor function to the fingers.  He looks quite well.  I removed his drain.  Abdomen is nontender lower extremity examination is benign.  We will mobilize him out of bed today and continue the IV antibiotics.  I would like to continue the antibiotics until Monday.  I discussed with him the care plan and other issues germane to his predicament.  We will see him back tomorrow and make sure that progress continues to be smooth.  We discussed alcohol withdrawal precautions and other issues germane to his predicament as well as healthy living status and challenges for the future etc. with all issues in mind we will proceed accordingly.  Rockford Leinen MD

## 2020-01-12 ENCOUNTER — Other Ambulatory Visit: Payer: Self-pay

## 2020-01-12 LAB — BASIC METABOLIC PANEL
Anion gap: 8 (ref 5–15)
BUN: 11 mg/dL (ref 6–20)
CO2: 25 mmol/L (ref 22–32)
Calcium: 8.5 mg/dL — ABNORMAL LOW (ref 8.9–10.3)
Chloride: 101 mmol/L (ref 98–111)
Creatinine, Ser: 0.74 mg/dL (ref 0.61–1.24)
GFR calc Af Amer: >60 mL/min (ref >60)
GFR calc non Af Amer: >60 mL/min (ref >60)
Glucose, Bld: 124 mg/dL — ABNORMAL HIGH (ref 70–99)
Potassium: 4 mmol/L (ref 3.5–5.1)
Sodium: 134 mmol/L — ABNORMAL LOW (ref 135–145)

## 2020-01-12 LAB — MAGNESIUM: Magnesium: 1.9 mg/dL (ref 1.7–2.4)

## 2020-01-12 MED ORDER — HYDROCHLOROTHIAZIDE 12.5 MG PO CAPS
12.5000 mg | ORAL_CAPSULE | Freq: Every day | ORAL | Status: DC
  Administered 2020-01-12: 12.5 mg via ORAL
  Filled 2020-01-12: qty 1

## 2020-01-12 MED ORDER — CEPHALEXIN 500 MG PO CAPS: 500.0000 mg | ORAL_CAPSULE | Freq: Four times a day (QID) | ORAL | 0 refills | Status: AC

## 2020-01-12 MED ORDER — METHOCARBAMOL 500 MG PO TABS: 500.0000 mg | ORAL_TABLET | Freq: Four times a day (QID) | ORAL | 0 refills | Status: DC

## 2020-01-12 MED ORDER — LISINOPRIL-HYDROCHLOROTHIAZIDE 10-12.5 MG PO TABS: 1.0000 | ORAL_TABLET | Freq: Every day | ORAL | Status: DC

## 2020-01-12 MED ORDER — OXYCODONE HCL 5 MG PO TABS: 5.0000 mg | ORAL_TABLET | ORAL | 0 refills | Status: DC | PRN

## 2020-01-12 MED ORDER — LISINOPRIL 10 MG PO TABS
10.0000 mg | ORAL_TABLET | Freq: Every day | ORAL | Status: DC
  Administered 2020-01-12: 10 mg via ORAL
  Filled 2020-01-12: qty 1

## 2020-01-12 NOTE — Plan of Care (Signed)

## 2020-01-12 NOTE — Progress Notes (Signed)
Patient ID: Richard Andrews, male   DOB: 02-Aug-1960, 59 y.o.   MRN: [id number] Patient is seen in follow-up.  Patient status post reconstruction right open wrist fracture dislocation.  At present time Richard Andrews is doing reasonably well.  Fingers move nicely compartments are soft.  I discussed with Richard Andrews the challenges ahead in regards to healing and the next steps involved in his care plan.  I will DC Richard Andrews home tomorrow.  We discussed his alcohol dependency issues and other challenges ahead for Richard Andrews in terms of the healing.  At present juncture no signs of DVT Richard Andrews is voiding well tolerating a regular diet and is without significant complications.  The patient is alert and oriented in no acute distress. The patient complains of pain in the affected upper extremity.  The patient is noted to have a normal HEENT exam. Lung fields show equal chest expansion and no shortness of breath. Abdomen exam is nontender without distention. Lower extremity examination does not show any fracture dislocation or blood clot symptoms. Pelvis is stable and the neck and back are stable and nontender.  Once again long discussion about many and all issues.  We will plan to discharge Richard Andrews home tomorrow.  I went over the follow-up and other plan of care issues with Richard Andrews at bedside at great length today.  Diagnosis: Status post type II open fracture dislocation distal radius and ulna with carpal dislocation requiring ORIF, I&D, and carpal tunnel release as well as fasciotomy.  Ivannia Willhelm MD

## 2020-01-12 NOTE — Progress Notes (Signed)
PROGRESS NOTE    Richard Andrews  UYQ:[id number] DOB: [birth year] DOA: 01/10/2020 PCP: Rodolph Bong, MD   Brief Narrative:  Patient is a 59 year old male with history of chronic alcoholism, tobacco abuse who presented to the emergency department after he tripped on a chair and fell on the floor fracturing his right forearm.  Alcohol level was 332 on presentation.  Imaging showed open fracture right distal radius with dislocation of the radiocarpal joint.  He was admitted under orthopedic service and he underwent irrigation debridement of the open fracture and open reduction with internal fixation.  Medicine was consulted for medical management including possible alcohol withdrawal.  Currently on CIWA protocol.Plan for DC tomorrow   Assessment & Plan:   Principal Problem:   Alcohol dependence (HCC) Active Problems:   Essential hypertension   Macrocytosis   Open Colles' fracture of right radius    Right distal open radius fracture: X-ray findings as above.  Underwent  irrigation debridement of the open fracture and open reduction with internal fixation.  Management as per orthopedics.PT/OT consulted. Orthopedics planning to continue antibiotics till Monday.  Chronic alcohol abuse/alcohol intoxication: Alcohol level was 300s on presentation.  Monitor for withdrawal.  Started on CIWA protocol.  Continue thiamine  Hypertension: Could be associated with alcohol withdrawal , we'll continue to watch .Restarted home meds  Macrocytosis: Secondary to alcoholism.  Continue folic acid  Hypokalemia: Supplemented.  Magnesium also supplemented.  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  Stable.  Continue to monitor         DVT prophylaxis:SCD Code Status: Full   Procedures:As above  Antimicrobials:  Anti-infectives (From admission, onward)   Start     Dose/Rate Route Frequency Ordered Stop   01/11/20 0400  ceFAZolin (ANCEF) IVPB 1 g/50 mL  premix     Discontinue     1 g 100 mL/hr over 30 Minutes Intravenous Every 8 hours 01/11/20 0001     01/11/20 0400  gentamicin (GARAMYCIN) 480 mg in dextrose 5 % 100 mL IVPB     Discontinue     480 mg 112 mL/hr over 60 Minutes Intravenous Every 24 hours 01/11/20 0040     01/11/20 0015  ceFAZolin (ANCEF) IVPB 1 g/50 mL premix  Status:  Discontinued        1 g 100 mL/hr over 30 Minutes Intravenous NOW 01/11/20 0001 01/11/20 0002   01/10/20 2015  gentamicin (GARAMYCIN) 170 mg in dextrose 5 % 50 mL IVPB        170 mg 108.5 mL/hr over 30 Minutes Intravenous  Once 01/10/20 2004 01/10/20 2045   01/10/20 1615  ceFAZolin (ANCEF) IVPB 2g/100 mL premix        2 g 200 mL/hr over 30 Minutes Intravenous  Once 01/10/20 1613 01/10/20 1859      Subjective:  Patient seen and examined at the bedside today.  Hemodynamically stable.  Complains of pain today on the surgery site otherwise looks comfortable  Objective: Vitals:   01/11/20 1019 01/11/20 1736 01/11/20 1955 01/12/20 0327  BP: (!) 146/92 (!) 139/78 (!) 156/88 (!) 161/91  Pulse: 66 71 66 62  Resp: 20 18 16 18   Temp: 98 F (36.7 C) 98.8 F (37.1 C) 98.2 F (36.8 C) 98 F (36.7 C)  TempSrc: Oral Oral Oral Oral  SpO2: 96% 97% 98% 99%  Weight:        Intake/Output Summary (Last 24 hours) at 01/12/2020 0846 Last data filed at 01/12/2020 0300 Gross per  24 hour  Intake 1832.69 ml  Output 2430 ml  Net -597.31 ml   Filed Weights   01/10/20 2006  Weight: 68 kg    Examination:  General exam: Appears calm and comfortable ,Not in distress,average built HEENT:PERRL,Oral mucosa moist, Ear/Nose normal on gross exam Respiratory system: Bilateral equal air entry, normal vesicular breath sounds, no wheezes or crackles  Cardiovascular system: S1 & S2 heard, RRR. No JVD, murmurs, rubs, gallops or clicks. Gastrointestinal system: Abdomen is nondistended, soft and nontender. No organomegaly or masses felt. Normal bowel sounds heard. Central nervous  system: Alert and oriented. No focal neurological deficits. Extremities: No edema, no clubbing ,no cyanosis, right upper extremity wrapped with dressings   Data Reviewed: I have personally reviewed following labs and imaging studies  CBC: Recent Labs  Lab 01/10/20 1700 01/11/20 0325  WBC 5.2 4.7  NEUTROABS 3.8  --   HGB 13.5 12.5*  HCT 39.6 36.4*  MCV 106.5* 104.6*  PLT 83* 70*   Basic Metabolic Panel: Recent Labs  Lab 01/10/20 1700 01/11/20 0325 01/12/20 0353  NA 141 139 134*  K 3.6 3.4* 4.0  CL 106 105 101  CO2 21* 20* 25  GLUCOSE 120* 152* 124*  BUN 7 6 11   CREATININE 0.93 0.72 0.74  CALCIUM 8.9 8.2* 8.5*  MG  --  1.2* 1.9   GFR: CrCl cannot be calculated (Unknown ideal weight.). Liver Function Tests: Recent Labs  Lab 01/11/20 0325  AST 56*  ALT 39  ALKPHOS 40  BILITOT 1.0  PROT 7.2  ALBUMIN 3.7   No results for input(s): LIPASE, AMYLASE in the last 168 hours. No results for input(s): AMMONIA in the last 168 hours. Coagulation Profile: No results for input(s): INR, PROTIME in the last 168 hours. Cardiac Enzymes: No results for input(s): CKTOTAL, CKMB, CKMBINDEX, TROPONINI in the last 168 hours. BNP (last 3 results) No results for input(s): PROBNP in the last 8760 hours. HbA1C: No results for input(s): HGBA1C in the last 72 hours. CBG: No results for input(s): GLUCAP in the last 168 hours. Lipid Profile: No results for input(s): CHOL, HDL, LDLCALC, TRIG, CHOLHDL, LDLDIRECT in the last 72 hours. Thyroid Function Tests: No results for input(s): TSH, T4TOTAL, FREET4, T3FREE, THYROIDAB in the last 72 hours. Anemia Panel: No results for input(s): VITAMINB12, FOLATE, FERRITIN, TIBC, IRON, RETICCTPCT in the last 72 hours. Sepsis Labs: No results for input(s): PROCALCITON, LATICACIDVEN in the last 168 hours.  Recent Results (from the past 240 hour(s))  SARS Coronavirus 2 by RT PCR (hospital order, performed in [facility] hospital lab) Nasopharyngeal  Nasopharyngeal Swab     Status: None   Collection Time: 01/10/20  5:29 PM   Specimen: Nasopharyngeal Swab  Result Value Ref Range Status   SARS Coronavirus 2 NEGATIVE NEGATIVE Final    Comment: (NOTE) SARS-CoV-2 target nucleic acids are NOT DETECTED.  The SARS-CoV-2 RNA is generally detectable in upper and lower respiratory specimens during the acute phase of infection. The lowest concentration of SARS-CoV-2 viral copies this assay can detect is 250 copies / mL. A negative result does not preclude SARS-CoV-2 infection and should not be used as the sole basis for treatment or other patient management decisions.  A negative result may occur with improper specimen collection / handling, submission of specimen other than nasopharyngeal swab, presence of viral mutation(s) within the areas targeted by this assay, and inadequate number of viral copies (<250 copies / mL). A negative result must be combined with clinical observations, patient history,  and epidemiological information.  Fact Sheet for Patients:   BoilerBrush.com.cy  Fact Sheet for Healthcare Providers: https://pope.com/  This test is not yet approved or  cleared by the [place] FDA and has been authorized for detection and/or diagnosis of SARS-CoV-2 by FDA under an Emergency Use Authorization (EUA).  This EUA will remain in effect (meaning this test can be used) for the duration of the COVID-19 declaration under Section 564(b)(1) of the Act, 21 U.S.C. section 360bbb-3(b)(1), unless the authorization is terminated or revoked sooner.  Performed at [facility], [address], [place], [name] [address]          Radiology Studies: DG Wrist Complete Right  Result Date: 01/10/2020 CLINICAL DATA:  Open fracture. Fall onto outstretched arm on concrete today. EXAM: RIGHT WRIST - COMPLETE 3+ VIEW COMPARISON:  None. FINDINGS: Severely displaced distal radius fracture  primarily through the metaphysis. There is 2.5 cm radial displacement of distal fracture fragment. Fracture is comminuted with small adjacent fracture fragments. There is proximal migration of the distal fracture fragment which remains congruent with the carpal bones. Disruption of the distal radioulnar joint. Possible ulna styloid fracture. The ulna appears to protrude through the skin and is exposed to air. Edema and soft tissue air in the adjacent soft tissues. IMPRESSION: 1. Severely displaced and comminuted distal radius fracture with proximal migration of the distal fracture fragment. Carpal bones remain aligned with the distal radius fragment. Disruption of the distal radioulnar joint. 2. The ulna protrudes through the skin and is exposed to air. Possible ulna styloid fracture. Electronically Signed   By: Narda Rutherford M.D.   On: 01/10/2020 17:20   DG MINI C-ARM IMAGE ONLY  Result Date: 01/10/2020 There is no interpretation for this exam.  This order is for images obtained during a surgical procedure.  Please See "Surgeries" Tab for more information regarding the procedure.        Scheduled Meds: . vitamin C  1,000 mg Oral Daily  . docusate sodium  100 mg Oral BID  . folic acid  1 mg Oral Daily  . LORazepam  0-4 mg Oral Q6H   Followed by  . [START ON 01/13/2020] LORazepam  0-4 mg Oral Q12H  . multivitamin with minerals  1 tablet Oral Daily  . thiamine  100 mg Oral Daily   Or  . thiamine  100 mg Intravenous Daily   Continuous Infusions: .  ceFAZolin (ANCEF) IV 1 g (01/12/20 0624)  . gentamicin 480 mg (01/12/20 0331)  . lactated ringers 75 mL/hr at 01/11/20 1202  . methocarbamol (ROBAXIN) IV       LOS: 2 days    Time spent: 35 mins.More than 50% of that time was spent in counseling and/or coordination of care.      Burnadette Pop, MD [facility] P8/06/2019, 8:46 AM

## 2020-01-12 NOTE — Discharge Instructions (Signed)
Please make sure to continue elevation movement and massage your fingers.  Please call for any problems.  My office will call you so that we can see you in 2 to 2-1/2 weeks.  Please refrain from alcohol use and cigarette use as best as you can.  Please call for any issues or problems.  Please do not remove your bandage.  Please keep your bandage clean and dry and bag it for give a cast cover for showers.  We recommend that you to take vitamin C 1000 mg a day to promote healing. We also recommend that if you require  pain medicine that you take a stool softener to prevent constipation as most pain medicines will have constipation side effects. We recommend either Peri-Colace or Senokot and recommend that you also consider adding MiraLAX as well to prevent the constipation affects from pain medicine if you are required to use them. These medicines are over the counter and may be purchased at a local pharmacy. A cup of yogurt and a probiotic can also be helpful during the recovery process as the medicines can disrupt your intestinal environment. Keep bandage clean and dry.  Call for any problems.  No smoking.  Criteria for driving a car: you should be off your pain medicine for 7-8 hours, able to drive one handed(confident), thinking clearly and feeling able in your judgement to drive. Continue elevation as it will decrease swelling.  If instructed by MD move your fingers within the confines of the bandage/splint.  Use ice if instructed by your MD. Call immediately for any sudden loss of feeling in your hand/arm or change in functional abilities of the extremity.

## 2020-01-13 ENCOUNTER — Encounter (HOSPITAL_COMMUNITY): Payer: Self-pay | Admitting: Orthopedic Surgery

## 2020-01-13 LAB — BASIC METABOLIC PANEL
Anion gap: 8 (ref 5–15)
BUN: 10 mg/dL (ref 6–20)
CO2: 25 mmol/L (ref 22–32)
Calcium: 9.1 mg/dL (ref 8.9–10.3)
Chloride: 97 mmol/L — ABNORMAL LOW (ref 98–111)
Creatinine, Ser: 0.83 mg/dL (ref 0.61–1.24)
GFR calc Af Amer: >60 mL/min (ref >60)
GFR calc non Af Amer: >60 mL/min (ref >60)
Glucose, Bld: 109 mg/dL — ABNORMAL HIGH (ref 70–99)
Potassium: 3.9 mmol/L (ref 3.5–5.1)
Sodium: 130 mmol/L — ABNORMAL LOW (ref 135–145)

## 2020-01-13 MED ORDER — THIAMINE HCL 100 MG PO TABS
100.0000 mg | ORAL_TABLET | Freq: Every day | ORAL | 1 refills | Status: DC
Start: 2020-01-13 — End: 2020-01-13

## 2020-01-13 NOTE — Progress Notes (Signed)
Patient discharged home by private car.  All personal belongings gathered and taken home with patient.  Patient aknowledged understanding of discharge instructions.

## 2020-01-13 NOTE — Progress Notes (Signed)
PROGRESS NOTE    GENESIS PAGET  HEN:[id number] DOB: [birth year] DOA: 01/10/2020 PCP: [place] Bong, MD   Brief Narrative:  Patient is a 59 year old male with history of chronic alcoholism, tobacco abuse who presented to the emergency department after he tripped on a chair and fell on the floor fracturing his right forearm.  Alcohol level was 332 on presentation.  Imaging showed open fracture right distal radius with dislocation of the radiocarpal joint.  He was admitted under orthopedic service and he underwent irrigation debridement of the open fracture and open reduction with internal fixation.  Medicine was consulted for medical management including possible alcohol withdrawal.  Currently on CIWA protocol.Plan for DC today   Assessment & Plan:   Principal Problem:   Alcohol dependence (HCC) Active Problems:   Essential hypertension   Macrocytosis   Open Colles' fracture of right radius    Right distal open radius fracture: X-ray findings as above.  Underwent  irrigation debridement of the open fracture and open reduction with internal fixation.  Management as per orthopedics.PT/OT consulted. He has been discharged with oral abx  Chronic alcohol abuse/alcohol intoxication: Alcohol level was 300s on presentation.  Started on CIWA protocol.  Not on withdrawal.  Continue thiamine ,folic acid on discharge.  Hypertension: CRestarted home meds  Macrocytosis: Secondary to alcoholism.  Continue folic acid  Hypokalemia: Supplemented.  Magnesium also supplemented.  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  Stable.  Continue to monitor as an outpatient         DVT prophylaxis:SCD Code Status: Full   Procedures:As above  Antimicrobials:  Anti-infectives (From admission, onward)   Start     Dose/Rate Route Frequency Ordered Stop   01/12/20 0000  cephALEXin (KEFLEX) 500 MG capsule     Discontinue     500 mg Oral 4 times daily  01/12/20 1209 01/22/20 2359   01/11/20 0400  ceFAZolin (ANCEF) IVPB 1 g/50 mL premix     Discontinue     1 g 100 mL/hr over 30 Minutes Intravenous Every 8 hours 01/11/20 0001     01/11/20 0400  gentamicin (GARAMYCIN) 480 mg in dextrose 5 % 100 mL IVPB     Discontinue     480 mg 112 mL/hr over 60 Minutes Intravenous Every 24 hours 01/11/20 0040     01/11/20 0015  ceFAZolin (ANCEF) IVPB 1 g/50 mL premix  Status:  Discontinued        1 g 100 mL/hr over 30 Minutes Intravenous NOW 01/11/20 0001 01/11/20 0002   01/10/20 2015  gentamicin (GARAMYCIN) 170 mg in dextrose 5 % 50 mL IVPB        170 mg 108.5 mL/hr over 30 Minutes Intravenous  Once 01/10/20 2004 01/10/20 2045   01/10/20 1615  ceFAZolin (ANCEF) IVPB 2g/100 mL premix        2 g 200 mL/hr over 30 Minutes Intravenous  Once 01/10/20 1613 01/10/20 1859       Objective: Vitals:   01/12/20 1700 01/12/20 2050 01/13/20 0335 01/13/20 0757  BP: (!) 168/80 118/81 126/80 118/77  Pulse: 71 71 67 69  Resp:  15 15 17   Temp: 98.5 F (36.9 C) 99.1 F (37.3 C) 98.7 F (37.1 C) 98.7 F (37.1 C)  TempSrc: Oral Oral Oral Oral  SpO2: 99% 98% 98% 99%  Weight:      Height:       No intake or output data in the 24 hours ending 01/13/20 1025 Filed 03/14/20  01/10/20 2006  Weight: 68 kg    Examination:  Patient had already left when I entered the room today.   Data Reviewed: I have personally reviewed following labs and imaging studies  CBC: Recent Labs  Lab 01/10/20 1700 01/11/20 0325  WBC 5.2 4.7  NEUTROABS 3.8  --   HGB 13.5 12.5*  HCT 39.6 36.4*  MCV 106.5* 104.6*  PLT 83* 70*   Basic Metabolic Panel: Recent Labs  Lab 01/10/20 1700 01/11/20 0325 01/12/20 0353 01/13/20 0434  NA 141 139 134* 130*  K 3.6 3.4* 4.0 3.9  CL 106 105 101 97*  CO2 21* 20* 25 25  GLUCOSE 120* 152* 124* 109*  BUN 7 6 11 10   CREATININE 0.93 0.72 0.74 0.83  CALCIUM 8.9 8.2* 8.5* 9.1  MG  --  1.2* 1.9  --    GFR: Estimated Creatinine  Clearance: 91 mL/min (by C-G formula based on SCr of 0.83 mg/dL). Liver Function Tests: Recent Labs  Lab 01/11/20 0325  AST 56*  ALT 39  ALKPHOS 40  BILITOT 1.0  PROT 7.2  ALBUMIN 3.7   No results for input(s): LIPASE, AMYLASE in the last 168 hours. No results for input(s): AMMONIA in the last 168 hours. Coagulation Profile: No results for input(s): INR, PROTIME in the last 168 hours. Cardiac Enzymes: No results for input(s): CKTOTAL, CKMB, CKMBINDEX, TROPONINI in the last 168 hours. BNP (last 3 results) No results for input(s): PROBNP in the last 8760 hours. HbA1C: No results for input(s): HGBA1C in the last 72 hours. CBG: No results for input(s): GLUCAP in the last 168 hours. Lipid Profile: No results for input(s): CHOL, HDL, LDLCALC, TRIG, CHOLHDL, LDLDIRECT in the last 72 hours. Thyroid Function Tests: No results for input(s): TSH, T4TOTAL, FREET4, T3FREE, THYROIDAB in the last 72 hours. Anemia Panel: No results for input(s): VITAMINB12, FOLATE, FERRITIN, TIBC, IRON, RETICCTPCT in the last 72 hours. Sepsis Labs: No results for input(s): PROCALCITON, LATICACIDVEN in the last 168 hours.  Recent Results (from the past 240 hour(s))  SARS Coronavirus 2 by RT PCR (hospital order, performed in [facility] hospital lab) Nasopharyngeal Nasopharyngeal Swab     Status: None   Collection Time: 01/10/20  5:29 PM   Specimen: Nasopharyngeal Swab  Result Value Ref Range Status   SARS Coronavirus 2 NEGATIVE NEGATIVE Final    Comment: (NOTE) SARS-CoV-2 target nucleic acids are NOT DETECTED.  The SARS-CoV-2 RNA is generally detectable in upper and lower respiratory specimens during the acute phase of infection. The lowest concentration of SARS-CoV-2 viral copies this assay can detect is 250 copies / mL. A negative result does not preclude SARS-CoV-2 infection and should not be used as the sole basis for treatment or other patient management decisions.  A negative result may occur  with improper specimen collection / handling, submission of specimen other than nasopharyngeal swab, presence of viral mutation(s) within the areas targeted by this assay, and inadequate number of viral copies (<250 copies / mL). A negative result must be combined with clinical observations, patient history, and epidemiological information.  Fact Sheet for Patients:   01/12/20  Fact Sheet for Healthcare Providers: BoilerBrush.com.cy  This test is not yet approved or  cleared by the https://pope.com/ FDA and has been authorized for detection and/or diagnosis of SARS-CoV-2 by FDA under an Emergency Use Authorization (EUA).  This EUA will remain in effect (meaning this test can be used) for the duration of the COVID-19 declaration under Section 564(b)(1) of the Act,  21 U.S.C. section 360bbb-3(b)(1), unless the authorization is terminated or revoked sooner.  Performed at [facility], [address], [place], [name] [address]          Radiology Studies: No results found.      Scheduled Meds: . vitamin C  1,000 mg Oral Daily  . docusate sodium  100 mg Oral BID  . folic acid  1 mg Oral Daily  . lisinopril  10 mg Oral Daily   And  . hydrochlorothiazide  12.5 mg Oral Daily  . LORazepam  0-4 mg Oral Q12H  . multivitamin with minerals  1 tablet Oral Daily  . thiamine  100 mg Oral Daily   Or  . thiamine  100 mg Intravenous Daily   Continuous Infusions: .  ceFAZolin (ANCEF) IV 1 g (01/13/20 0601)  . gentamicin 480 mg (01/13/20 0418)  . lactated ringers 75 mL/hr at 01/11/20 1202  . methocarbamol (ROBAXIN) IV       LOS: 3 days    Time spent: 35 mins.More than 50% of that time was spent in counseling and/or coordination of care.      Burnadette Pop, MD [facility] P8/07/2019, 10:25 AM

## 2020-01-13 NOTE — Discharge Summary (Signed)
Physician Discharge Summary  Patient ID: [name] MRN: [id number] DOB/AGE: [birth year] 59 y.o.  Admit date: 01/10/2020 Discharge date:   Admission Diagnoses: arm laceration, right Past Medical History:  Diagnosis Date  . Allergy   . Distal radius fracture, left   . Hypertension     Discharge Diagnoses:  Principal Problem:   Alcohol dependence (HCC) Active Problems:   Essential hypertension   Macrocytosis   Open Colles' fracture of right radius   Surgeries: Procedure(s): ORIF RIGHT WRIST on 01/10/2020    Consultants: Treatment Team:  Anette Riedel, MD  Discharged Condition: Improved  Hospital Course: Richard Andrews is an 59 y.o. male who was admitted 01/10/2020 with a chief complaint of  Chief Complaint  Patient presents with  . Rt arm injury  , and found to have a diagnosis of arm laceration, right.  They were brought to the operating room on 01/10/2020 and underwent Procedure(s): ORIF RIGHT WRIST.    They were given perioperative antibiotics:  Anti-infectives (From admission, onward)   Start     Dose/Rate Route Frequency Ordered Stop   01/12/20 0000  cephALEXin (KEFLEX) 500 MG capsule     Discontinue     500 mg Oral 4 times daily 01/12/20 1209 01/22/20 2359   01/11/20 0400  ceFAZolin (ANCEF) IVPB 1 g/50 mL premix     Discontinue     1 g 100 mL/hr over 30 Minutes Intravenous Every 8 hours 01/11/20 0001     01/11/20 0400  gentamicin (GARAMYCIN) 480 mg in dextrose 5 % 100 mL IVPB     Discontinue     480 mg 112 mL/hr over 60 Minutes Intravenous Every 24 hours 01/11/20 0040     01/11/20 0015  ceFAZolin (ANCEF) IVPB 1 g/50 mL premix  Status:  Discontinued        1 g 100 mL/hr over 30 Minutes Intravenous NOW 01/11/20 0001 01/11/20 0002   01/10/20 2015  gentamicin (GARAMYCIN) 170 mg in dextrose 5 % 50 mL IVPB        170 mg 108.5 mL/hr over 30 Minutes Intravenous  Once 01/10/20 2004 01/10/20 2045   01/10/20 1615  ceFAZolin (ANCEF) IVPB 2g/100 mL premix        2  g 200 mL/hr over 30 Minutes Intravenous  Once 01/10/20 1613 01/10/20 1859    .  They were given sequential compression devices, early ambulation, and   Recent vital signs:  Patient Vitals for the past 24 hrs:  BP Temp Temp src Pulse Resp SpO2 Height  01/13/20 0335 126/80 98.7 F (37.1 C) Oral 67 15 98 % --  01/12/20 2050 118/81 99.1 F (37.3 C) Oral 71 15 98 % --  01/12/20 1700 (!) 168/80 98.5 F (36.9 C) Oral 71 -- 99 % --  01/12/20 0928 -- -- -- -- -- -- 5\' 11"  (1.803 m)  .  Recent laboratory studies: No results found.  Discharge Medications:   Allergies as of 01/13/2020   No Known Allergies     Medication List    STOP taking these medications   lisinopril-hydrochlorothiazide 10-12.5 MG tablet Commonly known as: ZESTORETIC     TAKE these medications   cephALEXin 500 MG capsule Commonly known as: KEFLEX Take 1 capsule (500 mg total) by mouth 4 (four) times daily for 10 days.   [employer] B-1 100 MG tablet Generic drug: thiamine TAKE 1 TABLET BY MOUTH EVERY DAY   folic acid 1 MG tablet Commonly known as: FOLVITE TAKE 1 TABLET BY  MOUTH EVERY DAY   ibuprofen 200 MG tablet Commonly known as: ADVIL Take 400 mg by mouth See admin instructions. Take two tablets (400 mg) by mouth in the morning on work days   lactulose (encephalopathy) 10 GM/15ML Soln Commonly known as: CHRONULAC Take 45 mLs (30 g total) by mouth 3 (three) times daily.   methocarbamol 500 MG tablet Commonly known as: Robaxin Take 1 tablet (500 mg total) by mouth 4 (four) times daily.   multivitamin Tabs tablet Take 1 tablet by mouth daily.   oxyCODONE 5 MG immediate release tablet Commonly known as: Roxicodone Take 1 tablet (5 mg total) by mouth every 4 (four) hours as needed.       Diagnostic Studies: DG Wrist Complete Right  Result Date: 01/10/2020 CLINICAL DATA:  Open fracture. Fall onto outstretched arm on concrete today. EXAM: RIGHT WRIST - COMPLETE 3+ VIEW COMPARISON:  None. FINDINGS:  Severely displaced distal radius fracture primarily through the metaphysis. There is 2.5 cm radial displacement of distal fracture fragment. Fracture is comminuted with small adjacent fracture fragments. There is proximal migration of the distal fracture fragment which remains congruent with the carpal bones. Disruption of the distal radioulnar joint. Possible ulna styloid fracture. The ulna appears to protrude through the skin and is exposed to air. Edema and soft tissue air in the adjacent soft tissues. IMPRESSION: 1. Severely displaced and comminuted distal radius fracture with proximal migration of the distal fracture fragment. Carpal bones remain aligned with the distal radius fragment. Disruption of the distal radioulnar joint. 2. The ulna protrudes through the skin and is exposed to air. Possible ulna styloid fracture. Electronically Signed   By: Narda Rutherford M.D.   On: 01/10/2020 17:20   DG MINI C-ARM IMAGE ONLY  Result Date: 01/10/2020 There is no interpretation for this exam.  This order is for images obtained during a surgical procedure.  Please See "Surgeries" Tab for more information regarding the procedure.    They benefited maximally from their hospital stay and there were no complications.     Disposition: Discharge disposition: 01-Home or Self Care      Discharge Instructions    Call MD / Call 911   Complete by: As directed    If you experience chest pain or shortness of breath, CALL 911 and be transported to the hospital emergency room.  If you develope a fever above 101 F, pus (white drainage) or increased drainage or redness at the wound, or calf pain, call your surgeon's office.   Constipation Prevention   Complete by: As directed    Drink plenty of fluids.  Prune juice may be helpful.  You may use a stool softener, such as Colace (over the counter) 100 mg twice a day.  Use MiraLax (over the counter) for constipation as needed.   Diet - low sodium heart healthy    Complete by: As directed    Increase activity slowly as tolerated   Complete by: As directed       Follow-up Information    Dominica Severin, MD Follow up in 14 day(s).   Specialty: Orthopedic Surgery Why: Dr. Carlos Levering office will call you for a follow-up appointment.  If you do not hear from him within 5 days from discharge please call the office. Contact information: [address] [name] [address] [phone]              Status post reconstruction right arm secondary to type II open fracture dislocation radius ulna  and carpus.  Patient be discharged home on Keflex, oxycodone, Robaxin and see me back in 2 weeks.  I discussed with him healthy living habits and other measures.  We certainly appreciate the very fine help of medicine in regards to his care and prevention of DTs.  We discussed alcohol cessation and other issues at great length.   Signed: Dionne Ano Josephus Harriger III 01/13/2020, 7:48 AM

## 2020-01-28 DIAGNOSIS — Z4789 Encounter for other orthopedic aftercare: Secondary | ICD-10-CM | POA: Insufficient documentation

## 2020-02-06 ENCOUNTER — Encounter: Payer: Self-pay | Admitting: Medical-Surgical

## 2020-02-06 ENCOUNTER — Ambulatory Visit: Payer: BC Managed Care – PPO | Admitting: Medical-Surgical

## 2020-02-06 VITALS — BP 145/99 | HR 66 | Ht 69.0 in | Wt 140.6 lb

## 2020-02-06 DIAGNOSIS — M25531 Pain in right wrist: Secondary | ICD-10-CM | POA: Insufficient documentation

## 2020-02-06 DIAGNOSIS — Z114 Encounter for screening for human immunodeficiency virus [HIV]: Secondary | ICD-10-CM

## 2020-02-06 DIAGNOSIS — Z09 Encounter for follow-up examination after completed treatment for conditions other than malignant neoplasm: Secondary | ICD-10-CM

## 2020-02-06 DIAGNOSIS — R260 Ataxic gait: Secondary | ICD-10-CM

## 2020-02-06 DIAGNOSIS — E878 Other disorders of electrolyte and fluid balance, not elsewhere classified: Secondary | ICD-10-CM | POA: Diagnosis not present

## 2020-02-06 DIAGNOSIS — F1020 Alcohol dependence, uncomplicated: Secondary | ICD-10-CM

## 2020-02-06 DIAGNOSIS — D539 Nutritional anemia, unspecified: Secondary | ICD-10-CM | POA: Insufficient documentation

## 2020-02-06 DIAGNOSIS — I10 Essential (primary) hypertension: Secondary | ICD-10-CM

## 2020-02-06 DIAGNOSIS — R748 Abnormal levels of other serum enzymes: Secondary | ICD-10-CM

## 2020-02-06 MED ORDER — CHLORDIAZEPOXIDE HCL 25 MG PO CAPS: ORAL_CAPSULE | ORAL | 0 refills | Status: DC

## 2020-02-06 MED ORDER — CHLORDIAZEPOXIDE HCL 25 MG PO CAPS: ORAL_CAPSULE | ORAL | 0 refills | Status: AC

## 2020-02-06 NOTE — Progress Notes (Signed)
New Patient Office Visit  Subjective:  Patient ID: Richard Andrews, male    DOB: 08-06-1960  Age: 59 y.o. MRN: [id number]  CC:  Chief Complaint  Patient presents with  . Establish Care    HPI [name] presents to establish care.  Hospital follow-up-was recently hospitalized for repair of his right wrist after having a severe open fracture. Reports the fracture occurred after he fell over a chair onto cement. He has had several falls and reports he has lower extremity weakness and difficulty with balance. This has been going on for several years, "at least 5 or 6". He does not remember any specific work-up for his weakness. Has done well with his recovery overall. Pain is well controlled even without oxycodone (wife will not let him take this as he continues to drink alcohol).  Alcohol abuse-admits to drinking whiskey in large amounts, a pint or 2 during the week and 2 pints each weekend per the patient. His wife estimates his alcohol consumption to approximately 1 gallon per week of whiskey. He expresses interest in reducing his alcohol consumption. Went to rehab several years ago for approximately 2 weeks where he reports doing well but states he will never go back. Asking for medication that may help him stop drinking.  Hypertension-has not taken blood pressure medicines in approximately 1 year. Did not have eye doctor to prescribe the medications at that time so he just stopped the medicine himself. Medication was not restarted on discharge from the hospital on 8/2. Checks blood pressures at home intermittently, reports running lower than here but unable to provide specific numbers or estimates. Admits to adding salt to foods.  Lab abnormalities-some alterations in electrolytes on last labs from his hospitalization including hyponatremia. Also had mild anemia with macrocytosis. Was started on thiamine and B12 at discharge and has been taking this regularly. Past Medical History:   Diagnosis Date  . Allergy   . Distal radius fracture, left   . Hypertension     Past Surgical History:  Procedure Laterality Date  . I & D EXTREMITY Right 01/10/2020   Procedure: ORIF RIGHT WRIST;  Surgeon: Roseanne Kaufman, MD;  Location: [place];  Service: Orthopedics;  Laterality: Right;  . OPEN REDUCTION INTERNAL FIXATION (ORIF) DISTAL RADIAL FRACTURE Left 01/28/2015   Procedure: OPEN REDUCTION INTERNAL FIXATION (ORIF) LEFT DISTAL RADIAL FRACTURE;  Surgeon: Leandrew Koyanagi, MD;  Location: [place];  Service: Orthopedics;  Laterality: Left;  . TONSILLECTOMY      Family History  Problem Relation Age of Onset  . Heart disease Mother   . Hyperlipidemia Mother   . Skin cancer Mother   . Cancer Father   . Multiple myeloma Sister   . Throat cancer Sister     Social History   Socioeconomic History  . Marital status: Married    Spouse name: Margreta Journey  . Number of children: 5  . Years of education: 72  . Highest education level: Not on file  Occupational History  . Occupation: Government social research officer  Tobacco Use  . Smoking status: Current Every Day Smoker    Packs/day: 1.00    Years: 10.00    Pack years: 10.00    Types: Cigarettes  . Smokeless tobacco: Current User    Types: Chew  Substance and Sexual Activity  . Alcohol use: Yes    Comment: 1 gallow of whisky/week  . Drug use: Never  . Sexual activity: Yes    Partners: Female  Other  Topics Concern  . Not on file  Social History Narrative  . Not on file   Social Determinants of Health   Financial Resource Strain:   . Difficulty of Paying Living Expenses: Not on file  Food Insecurity:   . Worried About Charity fundraiser in the Last Year: Not on file  . Ran Out of Food in the Last Year: Not on file  Transportation Needs:   . Lack of Transportation (Medical): Not on file  . Lack of Transportation (Non-Medical): Not on file  Physical Activity:   . Days of Exercise per Week: Not on file  . Minutes of  Exercise per Session: Not on file  Stress:   . Feeling of Stress : Not on file  Social Connections:   . Frequency of Communication with Friends and Family: Not on file  . Frequency of Social Gatherings with Friends and Family: Not on file  . Attends Religious Services: Not on file  . Active Member of Clubs or Organizations: Not on file  . Attends Archivist Meetings: Not on file  . Marital Status: Not on file  Intimate Partner Violence:   . Fear of Current or Ex-Partner: Not on file  . Emotionally Abused: Not on file  . Physically Abused: Not on file  . Sexually Abused: Not on file    ROS Review of Systems  Constitutional: Negative for chills, fatigue, fever and unexpected weight change.  Respiratory: Negative for cough, chest tightness, shortness of breath and wheezing.   Cardiovascular: Negative for chest pain, palpitations and leg swelling.  Neurological: Positive for tremors and weakness. Negative for dizziness, light-headedness and headaches.  Hematological: Bruises/bleeds easily.  Psychiatric/Behavioral: Negative for self-injury, sleep disturbance and suicidal ideas. The patient is nervous/anxious.     Objective:   Today's Vitals: BP (!) 145/99   Pulse 66   Ht '5\' 9"'  (1.753 m)   Wt 140 lb 9.6 oz (63.8 kg)   SpO2 99%   BMI 20.76 kg/m   Physical Exam Vitals and nursing note reviewed.  Constitutional:      General: He is not in acute distress.    Appearance: Normal appearance. He is normal weight.  HENT:     Head: Normocephalic and atraumatic.  Cardiovascular:     Rate and Rhythm: Normal rate and regular rhythm.     Pulses: Normal pulses.     Heart sounds: Normal heart sounds. No murmur heard.  No friction rub. No gallop.   Pulmonary:     Effort: Pulmonary effort is normal. No respiratory distress.     Breath sounds: Normal breath sounds. No wheezing or rales.  Musculoskeletal:        General: Signs of injury (Splint to right upper extremity) present.   Skin:    General: Skin is warm and dry.  Neurological:     Mental Status: He is alert and oriented to person, place, and time.     Sensory: No sensory deficit.     Motor: Weakness present.     Gait: Gait abnormal (Ataxic gait).  Psychiatric:        Mood and Affect: Mood normal.        Behavior: Behavior normal.        Thought Content: Thought content normal.        Judgment: Judgment normal.     Assessment & Plan:   1. Uncomplicated alcohol dependence (HCC) Checking thiamine, ammonia, and CMP today. Discussed cessation of alcohol use and expected course  of withdrawal. We will go ahead and start Librium fixed dosing over 4 days. Advised patient that he should avoid alcohol once he starts taking this medicine. He verbalized understanding and expressed agreement to the plan. While taking Librium, he is to staunchly avoid taking oxycodone. His wife was present during this conversation as we discussed the risks of taking Librium and oxycodone concurrently. Both the patient and his wife expressed understanding of the severe risks and will make sure he does not take the oxycodone. - Vitamin B1 - Ammonia - chlordiazePOXIDE (LIBRIUM) 25 MG capsule; Take 2 capsules (50 mg total) by mouth every 8 (eight) hours for 1 day, THEN 1 capsule (25 mg total) every 6 (six) hours for 1 day, THEN 1 capsule (25 mg total) every 12 (twelve) hours for 1 day, THEN 1 capsule (25 mg total) daily for 1 day.  Dispense: 13 capsule; Refill: 0  2. Encounter for screening for HIV Discussed screening recommendations. Adding HIV antibody testing to blood work today. - HIV Antibody (routine testing w rflx)  3. Macrocytic anemia Rechecking CBC to evaluate hemoglobin and macrocytosis. - CBC  4. Electrolyte imbalance Checking CMP. - COMPLETE METABOLIC PANEL WITH GFR  5. Essential hypertension Blood pressure slightly elevated in office today but per report, his home readings are fairly normal. We will recheck at his  follow-up to see if we need to restart antihypertensives.  6. Hospital discharge follow-up Doing well post discharge. Had a follow-up with his surgeon earlier today where he had his long cast removed. He has started physical therapy involving his fingers and will gradually progress as he heals.  7. Ataxic gait Exam showed mild reduction in muscle strength of lower extremities. Suspect this may be related to alcohol use. We will see what labs look like and then proceed with a plan for further evaluation. In the meantime, encourage careful monitoring and fall precautions.  Outpatient Encounter Medications as of 02/06/2020  Medication Sig  . Ascorbic Acid (VITAMIN C) 1000 MG tablet Take 1,000 mg by mouth daily.  . [employer] B-1 100 MG tablet TAKE 1 TABLET BY MOUTH EVERY DAY  . ibuprofen (ADVIL) 200 MG tablet Take 400 mg by mouth See admin instructions. Take two tablets (400 mg) by mouth in the morning on work days  . methocarbamol (ROBAXIN) 500 MG tablet Take 1 tablet (500 mg total) by mouth 4 (four) times daily.  Marland Kitchen oxyCODONE (ROXICODONE) 5 MG immediate release tablet Take 1 tablet (5 mg total) by mouth every 4 (four) hours as needed.  . chlordiazePOXIDE (LIBRIUM) 25 MG capsule Take 2 capsules (50 mg total) by mouth every 8 (eight) hours for 1 day, THEN 1 capsule (25 mg total) every 6 (six) hours for 1 day, THEN 1 capsule (25 mg total) every 12 (twelve) hours for 1 day, THEN 1 capsule (25 mg total) daily for 1 day.  . folic acid (FOLVITE) 1 MG tablet TAKE 1 TABLET BY MOUTH EVERY DAY (Patient not taking: Reported on 01/10/2020)  . lactulose, encephalopathy, (CHRONULAC) 10 GM/15ML SOLN Take 45 mLs (30 g total) by mouth 3 (three) times daily. (Patient not taking: Reported on 01/10/2020)  . multivitamin (ONE-A-DAY MEN'S) TABS tablet Take 1 tablet by mouth daily. (Patient not taking: Reported on 01/10/2020)  . [DISCONTINUED] chlordiazePOXIDE (LIBRIUM) 25 MG capsule Take 2 capsules (50 mg total) by mouth every 8  (eight) hours for 1 day, THEN 1 capsule (25 mg total) every 6 (six) hours for 1 day, THEN 1 capsule (25 mg  total) every 12 (twelve) hours for 1 day, THEN 1 capsule (25 mg total) daily for 1 day.   No facility-administered encounter medications on file as of 02/06/2020.    Follow-up: Return in about 2 weeks (around 02/20/2020) for alcohol cessation follow up.   Clearnce Sorrel, DNP, APRN, FNP-BC [facility]

## 2020-02-07 NOTE — Addendum Note (Signed)
Addended byChristen Butter on: 02/07/2020 04:29 PM   Modules accepted: Orders

## 2020-02-11 LAB — COMPLETE METABOLIC PANEL WITH GFR
AG Ratio: 1.4 (calc) (ref 1.0–2.5)
ALT: 59 U/L — ABNORMAL HIGH (ref 9–46)
AST: 90 U/L — ABNORMAL HIGH (ref 10–35)
Albumin: 4.8 g/dL (ref 3.6–5.1)
Alkaline phosphatase (APISO): 72 U/L (ref 35–144)
BUN: 16 mg/dL (ref 7–25)
CO2: 25 mmol/L (ref 20–32)
Calcium: 9.2 mg/dL (ref 8.6–10.3)
Chloride: 98 mmol/L (ref 98–110)
Creat: 0.92 mg/dL (ref 0.70–1.25)
GFR, Est African American: 104 mL/min/{1.73_m2} (ref >=60)
GFR, Est Non African American: 90 mL/min/{1.73_m2} (ref >=60)
Globulin: 3.5 g/dL (calc) (ref 1.9–3.7)
Glucose, Bld: 104 mg/dL (ref 65–139)
Potassium: 3.3 mmol/L — ABNORMAL LOW (ref 3.5–5.3)
Sodium: 137 mmol/L (ref 135–146)
Total Bilirubin: 1.6 mg/dL — ABNORMAL HIGH (ref 0.2–1.2)
Total Protein: 8.3 g/dL — ABNORMAL HIGH (ref 6.1–8.1)

## 2020-02-11 LAB — VITAMIN B1: Vitamin B1 (Thiamine): 31 nmol/L — ABNORMAL HIGH (ref 8–30)

## 2020-02-11 LAB — CBC
HCT: 32.9 % — ABNORMAL LOW (ref 38.5–50.0)
Hemoglobin: 11.5 g/dL — ABNORMAL LOW (ref 13.2–17.1)
MCH: 36.5 pg — ABNORMAL HIGH (ref 27.0–33.0)
MCHC: 35 g/dL (ref 32.0–36.0)
MCV: 104.4 fL — ABNORMAL HIGH (ref 80.0–100.0)
MPV: 11.2 fL (ref 7.5–12.5)
Platelets: 60 10*3/uL — ABNORMAL LOW (ref 140–400)
RBC: 3.15 10*6/uL — ABNORMAL LOW (ref 4.20–5.80)
RDW: 12 % (ref 11.0–15.0)
WBC: 3.5 10*3/uL — ABNORMAL LOW (ref 3.8–10.8)

## 2020-02-11 LAB — AMMONIA: Ammonia: 56 umol/L (ref <=72)

## 2020-02-11 LAB — HIV ANTIBODY (ROUTINE TESTING W REFLEX): HIV 1&2 Ab, 4th Generation: NONREACTIVE

## 2020-02-20 ENCOUNTER — Telehealth: Payer: BC Managed Care – PPO | Admitting: Medical-Surgical

## 2020-02-24 ENCOUNTER — Encounter: Payer: Self-pay | Admitting: Medical-Surgical

## 2020-02-24 ENCOUNTER — Telehealth (INDEPENDENT_AMBULATORY_CARE_PROVIDER_SITE_OTHER): Payer: BC Managed Care – PPO | Admitting: Medical-Surgical

## 2020-02-24 DIAGNOSIS — R748 Abnormal levels of other serum enzymes: Secondary | ICD-10-CM | POA: Diagnosis not present

## 2020-02-24 DIAGNOSIS — D539 Nutritional anemia, unspecified: Secondary | ICD-10-CM | POA: Diagnosis not present

## 2020-02-24 DIAGNOSIS — E878 Other disorders of electrolyte and fluid balance, not elsewhere classified: Secondary | ICD-10-CM

## 2020-02-24 DIAGNOSIS — F1020 Alcohol dependence, uncomplicated: Secondary | ICD-10-CM | POA: Diagnosis not present

## 2020-02-24 MED ORDER — THIAMINE MONONITRATE 100 MG PO TABS: 100.0000 mg | ORAL_TABLET | Freq: Every day | ORAL | 3 refills | Status: DC

## 2020-02-24 MED ORDER — VITAMIN B-12 1000 MCG PO TABS: 1000.0000 ug | ORAL_TABLET | Freq: Every day | ORAL | 3 refills | Status: DC

## 2020-02-24 NOTE — Progress Notes (Signed)
Virtual Visit via Video Note  I connected with Richard Andrews on 02/24/20 at  1:20 PM EDT by a video enabled telemedicine application and verified that I am speaking with the correct person using two identifiers.   I discussed the limitations of evaluation and management by telemedicine and the availability of in person appointments. The patient expressed understanding and agreed to proceed.  Patient location: home Provider locations: office  Subjective:    CC: ETOH abuse follow up  HPI: Pleasant 59 year old male accompanied by his wife presenting via MyChart video visit for a follow-up visit regarding alcohol abuse.  He was provided with a 4-day course of Librium to assist him with alcohol cessation.  He reports he did not drink any alcohol during this 4 days but started back on day 5 because he "wanted to".  Since he started back drinking, he is drinking approximately 1/2 pint of whiskey from Monday through Thursday.  On the weekends, he admits to being excessive and drinks approximately 1/2 gallon of whiskey.  Since he cut back on his alcohol intake, he notes he is better able to walk and his memory/brain fog is better.  He has not had any more falls since our last visit.  States his goal is to cut back alcohol use but he does not have a desire to quit drinking at this time.  Previously did inpatient rehab but refuses to consider returning for detox. Tolerating physical therapy for his right arm with only a mild increase in pain.   Past medical history, Surgical history, Family history not pertinant except as noted below, Social history, Allergies, and medications have been entered into the medical record, reviewed, and corrections made.   Review of Systems: No fevers, chills, night sweats, weight loss, chest pain, or shortness of breath.   Objective:    General: Speaking clearly in complete sentences without any shortness of breath.  Alert and oriented x3.  Normal judgment. No apparent  acute distress.  Impression and Recommendations:    1. Uncomplicated alcohol dependence (HCC) Discussed the nature of alcohol dependence and abuse.  Patient not open to inpatient rehab for detox at this time.  Suggested he and his wife look into [facility] for detox/rehab.  Recommend alcohol cessation rather than reduction for maximal health benefits.  2. Macrocytic anemia/electrolyte imbalance/elevated liver enzymes Rechecking CBC with differential and CMP. - CBC with Differential - COMPLETE METABOLIC PANEL WITH GFR  Return for Follow-up pending lab results..  20 minutes of non face-to-face time was provided during this encounter.  I discussed the assessment and treatment plan with the patient. The patient was provided an opportunity to ask questions and all were answered. The patient agreed with the plan and demonstrated an understanding of the instructions.   The patient was advised to call back or seek an in-person evaluation if the symptoms worsen or if the condition fails to improve as anticipated.  Thayer Ohm, DNP, APRN, FNP-BC [facility]

## 2020-06-22 ENCOUNTER — Encounter: Payer: Self-pay | Admitting: Medical-Surgical

## 2020-06-22 ENCOUNTER — Other Ambulatory Visit: Payer: Self-pay

## 2020-06-22 ENCOUNTER — Ambulatory Visit: Payer: BC Managed Care – PPO | Admitting: Medical-Surgical

## 2020-06-22 VITALS — BP 146/84 | HR 91 | Temp 99.0°F | Ht 69.0 in | Wt 150.3 lb

## 2020-06-22 DIAGNOSIS — F10288 Alcohol dependence with other alcohol-induced disorder: Secondary | ICD-10-CM

## 2020-06-22 NOTE — Progress Notes (Signed)
Subjective:    CC: wants to quit drinking  HPI: Pleasant 60 year old male accompanied by his wife presenting today to discuss alcohol use cessation. He is drinking a gallon+ of whiskey weekly and has been experiencing some forgetfulness, severe tremor, and difficulty walking. He has had several falls in the past 12 months without serious injury. Has done inpatient rehab once before and had a bad experience. Tried Librium in August 2021 with a short term reported decrease in alcohol intake. Unfortunately, he has resumed drinking at least a pint every day, more on the weekends. Today, reports he is ready to stop drinking since it is interfering with his ability to walk and function. Notes that he is willing to do whatever it takes to quit.   I reviewed the past medical history, family history, social history, surgical history, and allergies today and no changes were needed.  Please see the problem list section below in epic for further details.  Past Medical History: Past Medical History:  Diagnosis Date  . Allergy   . Distal radius fracture, left   . Hypertension    Past Surgical History: Past Surgical History:  Procedure Laterality Date  . I & D EXTREMITY Right 01/10/2020   Procedure: ORIF RIGHT WRIST;  Surgeon: Roseanne Kaufman, MD;  Location: [place];  Service: Orthopedics;  Laterality: Right;  . OPEN REDUCTION INTERNAL FIXATION (ORIF) DISTAL RADIAL FRACTURE Left 01/28/2015   Procedure: OPEN REDUCTION INTERNAL FIXATION (ORIF) LEFT DISTAL RADIAL FRACTURE;  Surgeon: Leandrew Koyanagi, MD;  Location: [place];  Service: Orthopedics;  Laterality: Left;  . TONSILLECTOMY     Social History: Social History   Socioeconomic History  . Marital status: Married    Spouse name: Margreta Journey  . Number of children: 5  . Years of education: 56  . Highest education level: Not on file  Occupational History  . Occupation: Government social research officer  Tobacco Use  . Smoking status: Current Every Day  Smoker    Packs/day: 1.00    Years: 10.00    Pack years: 10.00    Types: Cigarettes  . Smokeless tobacco: Current User    Types: Chew  Substance and Sexual Activity  . Alcohol use: Yes    Alcohol/week: 16.0 - 20.0 standard drinks    Types: 16 - 20 Standard drinks or equivalent per week    Comment: 1 gallon/week  . Drug use: Never  . Sexual activity: Yes    Partners: Female  Other Topics Concern  . Not on file  Social History Narrative  . Not on file   Social Determinants of Health   Financial Resource Strain: Not on file  Food Insecurity: Not on file  Transportation Needs: Not on file  Physical Activity: Not on file  Stress: Not on file  Social Connections: Not on file   Family History: Family History  Problem Relation Age of Onset  . Heart disease Mother   . Hyperlipidemia Mother   . Skin cancer Mother   . Cancer Father   . Multiple myeloma Sister   . Throat cancer Sister    Allergies: No Known Allergies Medications: See med rec.  Review of Systems: See HPI for pertinent positives and negatives.   Objective:    General: Well Developed, well nourished, and in no acute distress.  Neuro: Alert and oriented x3. Tremors of the head/neck/upper extremities at rest and with movement. Poor balance with halting gait, holding onto wife to ambulate. HEENT: Normocephalic, atraumatic.  Skin: Warm and dry.  Cardiac: Regular rate and rhythm, no murmurs rubs or gallops, no lower extremity edema.  Respiratory: Clear to auscultation bilaterally. Not using accessory muscles, speaking in full sentences.  Impression and Recommendations:    1. Alcohol dependence with other alcohol-induced disorder ([place]) With his high volume of alcohol intake, recommend referral to a rehab program with detox. Information provided for [facility] on [address]. Advised patient/wife to call them immediately after our appointment for completion of the survey and discussion on admission to the program.  Other options include [facility] in [place] in [place].  Return if symptoms worsen or fail to improve.  ___________________________________________ Clearnce Sorrel, DNP, APRN, FNP-BC [facility] [place]

## 2020-06-22 NOTE — Patient Instructions (Signed)
[facility] ([facility]) Phone number [phone]  Call them for a phone survey and admission to the program Will likely qualify for a Detox which can last 3-7 days but they will determine that once they talk to you.

## 2021-01-10 ENCOUNTER — Emergency Department (HOSPITAL_COMMUNITY): Payer: BC Managed Care – PPO

## 2021-01-10 ENCOUNTER — Emergency Department (HOSPITAL_COMMUNITY)
Admission: EM | Admit: 2021-01-10 | Discharge: 2021-01-11 | Disposition: A | Discharge status: Home / Self Care | Payer: BC Managed Care – PPO | Attending: Emergency Medicine | Admitting: Emergency Medicine

## 2021-01-10 DIAGNOSIS — F10129 Alcohol abuse with intoxication, unspecified: Secondary | ICD-10-CM | POA: Insufficient documentation

## 2021-01-10 DIAGNOSIS — S32010A Wedge compression fracture of first lumbar vertebra, initial encounter for closed fracture: Secondary | ICD-10-CM | POA: Insufficient documentation

## 2021-01-10 DIAGNOSIS — S0990XA Unspecified injury of head, initial encounter: Secondary | ICD-10-CM | POA: Diagnosis not present

## 2021-01-10 DIAGNOSIS — I10 Essential (primary) hypertension: Secondary | ICD-10-CM | POA: Insufficient documentation

## 2021-01-10 DIAGNOSIS — Y92009 Unspecified place in unspecified non-institutional (private) residence as the place of occurrence of the external cause: Secondary | ICD-10-CM | POA: Insufficient documentation

## 2021-01-10 DIAGNOSIS — F10920 Alcohol use, unspecified with intoxication, uncomplicated: Secondary | ICD-10-CM

## 2021-01-10 DIAGNOSIS — W1839XA Other fall on same level, initial encounter: Secondary | ICD-10-CM | POA: Diagnosis not present

## 2021-01-10 DIAGNOSIS — F17210 Nicotine dependence, cigarettes, uncomplicated: Secondary | ICD-10-CM | POA: Insufficient documentation

## 2021-01-10 DIAGNOSIS — S34101A Unspecified injury to L1 level of lumbar spinal cord, initial encounter: Secondary | ICD-10-CM | POA: Diagnosis present

## 2021-01-10 DIAGNOSIS — W19XXXA Unspecified fall, initial encounter: Secondary | ICD-10-CM

## 2021-01-10 MED ORDER — SODIUM CHLORIDE 0.9 % IV BOLUS
1000.0000 mL | Freq: Once | INTRAVENOUS | Status: AC
  Administered 2021-01-11: 1000 mL via INTRAVENOUS

## 2021-01-10 MED ORDER — THIAMINE HCL 100 MG/ML IJ SOLN
100.0000 mg | Freq: Once | INTRAMUSCULAR | Status: AC
  Administered 2021-01-11: 100 mg via INTRAVENOUS
  Filled 2021-01-10: qty 2

## 2021-01-10 NOTE — ED Triage Notes (Signed)
EMS arrival. Patient came from. Family called. Unknown LOC. ETOH.

## 2021-01-10 NOTE — ED Provider Notes (Signed)
[facility] EMERGENCY DEPARTMENT Provider Note  CSN: [id number] Arrival date & time: 01/10/21 2314  Chief Complaint(s) Fall  HPI [name] is a 60 y.o. male with a past medical history listed below including alcohol dependence who admits to drinking 1/5 of vodka a day here after a fall while intoxicated.  Fall was at home.  Outdoors on a wooden deck it was unwitnessed but apparently patient fell backwards onto the the deck itself.  Unknown LOC and patient is amnesic to the event.  Patient's daughter who works at registration for system is here and reported that patient's has had 3 falls over this past weekend.  She is aware of his drinking history and states that the fifth of vodka is typical for the patient.  However the falls are something that have been new.  She reports that he is noncompliant with many of his medications.  He is on lactulose for hyperammonemia.  She reports that the patient has been complaining of lower back pain since a fall recently.  Still able to ambulate.  She also reports that the patient had welts on his body over the past couple days.  Patient currently has no physical complaints.   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 Authorizing Provider  Ascorbic Acid (VITAMIN C) 1000 MG tablet Take 1,000 mg by mouth daily.    [provider]  ibuprofen (ADVIL) 200 MG tablet Take 400 mg by mouth See admin instructions. Take two tablets (400 mg) by mouth in the morning on work days    [provider]  thiamine (VITAMIN B-1) 100 MG tablet Take 1 tablet (100 mg total) by mouth daily. 02/24/20   Samuel Bouche, NP  vitamin B-12 (CYANOCOBALAMIN) 1000 MCG tablet Take 1 tablet (1,000 mcg total) by mouth daily. 02/24/20   Samuel Bouche, NP                                                                                                                                    Past Surgical History Past Surgical History:  Procedure Laterality Date   I & D EXTREMITY Right 01/10/2020   Procedure: ORIF RIGHT WRIST;  Surgeon: Roseanne Kaufman,  MD;  Location: [place];  Service: Orthopedics;  Laterality: Right;   OPEN REDUCTION INTERNAL FIXATION (ORIF) DISTAL RADIAL FRACTURE Left 01/28/2015   Procedure: OPEN REDUCTION INTERNAL FIXATION (ORIF) LEFT DISTAL RADIAL FRACTURE;  Surgeon: Leandrew Koyanagi, MD;  Location: [place];  Service: Orthopedics;  Laterality: Left;   TONSILLECTOMY     Family History Family History  Problem Relation Age of Onset   Heart disease Mother    Hyperlipidemia Mother    Skin cancer Mother    Cancer Father    Multiple myeloma Sister    Throat cancer Sister     Social History Social History   Tobacco Use   Smoking status: Every Day    Packs/day: 1.00    Years: 10.00    Pack years: 10.00    Types: Cigarettes   Smokeless tobacco: Current    Types: Chew  Substance Use Topics   Alcohol use: Yes    Alcohol/week: 16.0 - 20.0 standard drinks    Types: 16 - 20 Standard drinks or equivalent per week    Comment: 1 gallon/week   Drug use: Never   Allergies Patient has no known allergies.  Review of Systems Review of Systems All other systems are reviewed and are negative for acute  change except as noted in the HPI  Physical Exam Vital Signs  I have reviewed the triage vital signs BP (!) 132/92 (BP Location: Right Arm)   Pulse 68   Temp 97.6 F (36.4 C)   Resp 18   SpO2 98%   Physical Exam Constitutional:      General: He is not in acute distress.    Appearance: He is well-developed. He is not diaphoretic.     Interventions: Cervical collar in place.  HENT:     Head: Normocephalic.     Right Ear: External ear normal.     Left Ear: External ear normal.  Eyes:     General: No scleral icterus.       Right eye: No discharge.        Left eye: No discharge.     Conjunctiva/sclera: Conjunctivae normal.     Pupils: Pupils are equal, round, and reactive to light.  Cardiovascular:     Rate and Rhythm: Regular rhythm.     Pulses:          Radial pulses are 2+ on the right side and 2+ on the left side.       Dorsalis pedis pulses are 2+ on the right side and 2+ on the left side.     Heart sounds: Normal heart sounds. No murmur heard.   No friction rub. No gallop.  Pulmonary:     Effort: Pulmonary effort is normal. No respiratory distress.     Breath sounds: Normal breath sounds. No stridor.  Abdominal:     General: There is no distension.     Palpations: Abdomen is soft.     Tenderness: There is no abdominal tenderness.  Musculoskeletal:     Cervical back: Normal range of motion and neck supple. No bony tenderness.     Thoracic back: No bony tenderness.     Lumbar back: No bony tenderness.     Comments: Clavicle stable. Chest stable to AP/Lat compression. Pelvis stable to Lat compression. No obvious extremity deformity. No chest or abdominal wall contusion.  Skin:    General: Skin is warm.  Neurological:     Mental Status: He is alert and oriented to person,  place, and time.     GCS: GCS eye subscore is 4. GCS verbal subscore is 5. GCS motor subscore is 6.     Comments: Follows commands and is moving all extremities with good 5/5 strength     ED  Results and Treatments Labs (all labs ordered are listed, but only abnormal results are displayed) Labs Reviewed  CBC WITH DIFFERENTIAL/PLATELET - Abnormal; Notable for the following components:      Result Value   WBC 3.8 (*)    RBC 3.88 (*)    MCV 105.2 (*)    MCH 37.1 (*)    Platelets 66 (*)    All other components within normal limits  COMPREHENSIVE METABOLIC PANEL - Abnormal; Notable for the following components:   Potassium 3.3 (*)    CO2 19 (*)    Calcium 8.4 (*)    AST 108 (*)    ALT 62 (*)    Total Bilirubin 1.3 (*)    Anion gap 18 (*)    All other components within normal limits  AMMONIA - Abnormal; Notable for the following components:   Ammonia 46 (*)    All other components within normal limits                                                                                                                         EKG  EKG Interpretation  Date/Time:  Monday January 11 2021 02:38:18 EDT Ventricular Rate:  62 PR Interval:  158 QRS Duration: 106 QT Interval:  448 QTC Calculation: 454 R Axis:   64 Text Interpretation: Normal sinus rhythm Incomplete left bundle branch block Confirmed by Addison Lank [phone]) on 01/11/2021 2:42:25 AM       Radiology CT Head Wo Contrast  Result Date: 01/11/2021 CLINICAL DATA:  Head trauma, abnormal mental status (Age 69-64y) EXAM: CT HEAD WITHOUT CONTRAST TECHNIQUE: Contiguous axial images were obtained from the base of the skull through the vertex without intravenous contrast. COMPARISON:  Brain MRI 12/26/2016 FINDINGS: Brain: Mild thickening of the posterior falx but no definite subdural hematoma or layering along the tentorium. No acute hemorrhage. Generalized atrophy. Stable arachnoid cyst in the left middle cranial fossa. No evidence of acute ischemia, mass lesion/mass mass effect, hydrocephalus, or midline shift. Vascular: No hyperdense vessel. Skull: No fracture or focal lesion. Sinuses/Orbits: Chronic mucous retention cyst in the  left maxillary sinus. Occasional mucosal thickening of ethmoid air cells and right inferior frontal sinus. No mastoid effusion. Unremarkable orbits. Other: None. IMPRESSION: 1. No acute abnormality. There is mild posterior falx thickening but no definite subdural blood component. 2. Generalized atrophy. Stable arachnoid cyst in the left middle cranial fossa. Electronically Signed   By: Keith Rake M.D.   On: 01/11/2021 00:47   CT Cervical Spine Wo Contrast  Result Date: 01/11/2021 CLINICAL DATA:  Neck trauma, intoxicated or obtunded (Age >= 16y) EXAM: CT CERVICAL SPINE WITHOUT CONTRAST TECHNIQUE: Multidetector CT imaging of the cervical spine was performed without intravenous  contrast. Multiplanar CT image reconstructions were also generated. COMPARISON:  None. FINDINGS: Alignment: Trace anterolisthesis of C3 on C4, likely facet mediated. No traumatic subluxation. Skull base and vertebrae: No acute fracture. Vertebral body heights are maintained. The dens and skull base are intact. Soft tissues and spinal canal: No prevertebral fluid or swelling. No visible canal hematoma. Disc levels: Mild degenerative disc disease with disc space narrowing and endplate spurring T5-V7, C5-C6, and C6-C7. Multilevel facet hypertrophy. No high-grade canal stenosis. Upper chest: Biapical pleuroparenchymal scarring. No acute findings. Other: None. IMPRESSION: 1. No acute fracture or subluxation of the cervical spine. 2. Mild multilevel degenerative disc disease and facet hypertrophy. Electronically Signed   By: Keith Rake M.D.   On: 01/11/2021 00:50   CT Lumbar Spine Wo Contrast  Result Date: 01/11/2021 CLINICAL DATA:  Initial evaluation for low back pain status post trauma. EXAM: CT LUMBAR SPINE WITHOUT CONTRAST TECHNIQUE: Multidetector CT imaging of the lumbar spine was performed without intravenous contrast administration. Multiplanar CT image reconstructions were also generated. COMPARISON:  None. FINDINGS:  Segmentation: Standard. Lowest well-formed disc space labeled the L5-S1 level. Alignment: Physiologic with preservation of the normal lumbar lordosis. No listhesis. Vertebrae: Acute compression fracture involving the L1 vertebral body is seen. Associated central height loss measures up to nearly 50% without significant bony retropulsion. Vertebral body height otherwise maintained. Visualized sacrum and pelvis intact. Small benign bone island noted within the left sacrum. No other discrete or worrisome osseous lesions. Paraspinal and other soft tissues: Scattered soft tissue stranding and edema seen adjacent to the L1 compression fracture, extending along the left greater than right psoas musculature. Bilateral nonobstructive nephrolithiasis. Hepatic steatosis. Mild aortic atherosclerosis. Misty mesenteric stranding noted as well. Disc levels: L1-2: Mild diffuse disc bulge, eccentric to the right. No spinal stenosis. Foramina remain patent. L2-3: Mild disc bulge, eccentric to the right. Superimposed small right foraminal disc protrusion extends into the inferior right neural foramen (series 6, image 65). Mild facet hypertrophy. No significant spinal stenosis. Foramina remain patent. L3-4: Minimal disc bulge with mild facet hypertrophy. No canal or foraminal stenosis. L4-5:  Minimal disc bulge with mild facet hypertrophy.  No stenosis. L5-S1: Minimal disc bulge with mild facet hypertrophy. No stenosis. IMPRESSION: 1. Acute compression fracture involving the L1 vertebral body with up to nearly 50% central height loss without significant bony retropulsion. 2. No other acute traumatic injury within the lumbar spine. 3. Bilateral nonobstructive nephrolithiasis. 4. Hepatic steatosis. 5. Aortic Atherosclerosis (ICD10-I70.0). Electronically Signed   By: Jeannine Boga M.D.   On: 01/11/2021 00:54    Pertinent labs & imaging results that were available during my care of the patient were reviewed by me and considered in  my medical decision making (see chart for details).  Medications Ordered in ED Medications  sodium chloride 0.9 % bolus 1,000 mL (1,000 mLs Intravenous New Bag/Given 01/11/21 0234)  thiamine (B-1) injection 100 mg (100 mg Intravenous Given 01/11/21 0232)  Procedures Procedures  (including critical care time)  Medical Decision Making / ED Course I have reviewed the nursing notes for this encounter and the patient's prior records (if available in EHR or on provided paperwork).   SEBASTION JUN was evaluated in Emergency Department on 01/11/2021 for the symptoms described in the history of present illness. He was evaluated in the context of the global COVID-19 pandemic, which necessitated consideration that the patient might be at risk for infection with the SARS-CoV-2 virus that causes COVID-19. Institutional protocols and algorithms that pertain to the evaluation of patients at risk for COVID-19 are in a state of rapid change based on information released by regulatory bodies including the CDC and federal and state organizations. These policies and algorithms were followed during the patient's care in the ED.    Clinical Course as of 01/11/21 0246  Mon Jan 11, 2021  0120 Unwitnessed fall in the setting of alcohol intoxication. No evidence of severe trauma noted on exam. Imaging obtained based on history and intoxication.  Imaging reviewed by myself: CT head negative for ICH. Cervical spine negative for acute fracture. CT of the lumbar spine notable for L1 compression fracture. Radiology read confirming my findings.  Labs reviewed by myself: No leukocytosis or anemia. No significant electrolyte derangements or renal sufficiency. Slightly elevated LFTs, consistent with his alcohol use. Gap likely due to alcohol intoxication. Ammonia level below his previous  levels.   [PC]  7680 Patient is more oriented. Wife at bedside updated.  [PC]  8811 EKG with possible lead reversal. No acute ischemic changes, dysrhythmias or blocks.  We will have RN repeat to confirm the placement. [PC]  0315 Repeat EKG confirmed initial lead reversal. Otherwise reassuring. [PC]  0243 Recommended lumbar brace.  Neurosurgery follow-up in 3 to 5 weeks for repeat imaging of the lumbar compression fracture. [PC]    Clinical Course User Index [PC] Kash Mothershead, Grayce Sessions, MD     Final Clinical Impression(s) / ED Diagnoses Final diagnoses:  Alcoholic intoxication without complication ([place])  Fall, initial encounter  Compression fracture of L1 vertebra, initial encounter [place])   The patient appears reasonably screened and/or stabilized for discharge and I doubt any other medical condition or other [facility] requiring further screening, evaluation, or treatment in the ED at this time prior to discharge. Safe for discharge with strict return precautions.  Disposition: Discharge  Condition: Good  I have discussed the results, Dx and Tx plan with the patient/family who expressed understanding and agree(s) with the plan. Discharge instructions discussed at length. The patient/family was given strict return precautions who verbalized understanding of the instructions. No further questions at time of discharge.    ED Discharge Orders     None       Follow Up: Samuel Bouche, NP [address] [place] [address] [phone]  Call  to schedule an appointment for close follow up  [place], Ocie Cornfield, NP [address] [place] [address] [place] [address] [phone]  Call  to schedule an appointment for close follow up for lumbar spine fracture      This chart was dictated using voice recognition software.  Despite best efforts to proofread,  errors can occur which can change the documentation meaning.    Fatima Blank, MD 01/11/21  [phone]

## 2021-01-11 ENCOUNTER — Telehealth: Payer: Self-pay | Admitting: General Practice

## 2021-01-11 ENCOUNTER — Emergency Department (HOSPITAL_COMMUNITY): Payer: BC Managed Care – PPO

## 2021-01-11 LAB — CBC WITH DIFFERENTIAL/PLATELET
Abs Immature Granulocytes: 0.03 10*3/uL (ref 0.00–0.07)
Basophils Absolute: 0 10*3/uL (ref 0.0–0.1)
Basophils Relative: 1 %
Eosinophils Absolute: 0.1 10*3/uL (ref 0.0–0.5)
Eosinophils Relative: 1 %
HCT: 40.8 % (ref 39.0–52.0)
Hemoglobin: 14.4 g/dL (ref 13.0–17.0)
Immature Granulocytes: 1 %
Lymphocytes Relative: 34 %
Lymphs Abs: 1.3 10*3/uL (ref 0.7–4.0)
MCH: 37.1 pg — ABNORMAL HIGH (ref 26.0–34.0)
MCHC: 35.3 g/dL (ref 30.0–36.0)
MCV: 105.2 fL — ABNORMAL HIGH (ref 80.0–100.0)
Monocytes Absolute: 0.5 10*3/uL (ref 0.1–1.0)
Monocytes Relative: 14 %
Neutro Abs: 1.9 10*3/uL (ref 1.7–7.7)
Neutrophils Relative %: 49 %
Platelets: 66 10*3/uL — ABNORMAL LOW (ref 150–400)
RBC: 3.88 MIL/uL — ABNORMAL LOW (ref 4.22–5.81)
RDW: 12.4 % (ref 11.5–15.5)
WBC: 3.8 10*3/uL — ABNORMAL LOW (ref 4.0–10.5)
nRBC: 0 % (ref 0.0–0.2)

## 2021-01-11 LAB — COMPREHENSIVE METABOLIC PANEL
ALT: 62 U/L — ABNORMAL HIGH (ref 0–44)
AST: 108 U/L — ABNORMAL HIGH (ref 15–41)
Albumin: 4.1 g/dL (ref 3.5–5.0)
Alkaline Phosphatase: 66 U/L (ref 38–126)
Anion gap: 18 — ABNORMAL HIGH (ref 5–15)
BUN: 10 mg/dL (ref 8–23)
CO2: 19 mmol/L — ABNORMAL LOW (ref 22–32)
Calcium: 8.4 mg/dL — ABNORMAL LOW (ref 8.9–10.3)
Chloride: 103 mmol/L (ref 98–111)
Creatinine, Ser: 0.79 mg/dL (ref 0.61–1.24)
GFR, Estimated: >60 mL/min (ref >60)
Glucose, Bld: 96 mg/dL (ref 70–99)
Potassium: 3.3 mmol/L — ABNORMAL LOW (ref 3.5–5.1)
Sodium: 140 mmol/L (ref 135–145)
Total Bilirubin: 1.3 mg/dL — ABNORMAL HIGH (ref 0.3–1.2)
Total Protein: 7.6 g/dL (ref 6.5–8.1)

## 2021-01-11 LAB — AMMONIA: Ammonia: 46 umol/L — ABNORMAL HIGH (ref 9–35)

## 2021-01-11 NOTE — Discharge Instructions (Addendum)
Please wear a lumbar brace. Follow-up with neurosurgery regarding your lumbar compression fracture. Please take your medications as prescribed especially your blood pressure medicine and vitamin B1.

## 2021-01-11 NOTE — Telephone Encounter (Signed)
Transition Care Management Follow-up Telephone Call Date of discharge and from where: 01/11/21 [facility]  How have you been since you were released from the hospital? Still hurting Any questions or concerns? No  Items Reviewed: Did the pt receive and understand the discharge instructions provided? Yes  Medications obtained and verified? No  Other? No  Any new allergies since your discharge? No  Dietary orders reviewed? No Do you have support at home? Yes   Home Care and Equipment/Supplies: Were home health services ordered? no   Functional Questionnaire: (I = Independent and D = Dependent) ADLs: I  Bathing/Dressing- I  Meal Prep- I  Eating- I  Maintaining continence- I  Transferring/Ambulation- I  Managing Meds- I  Follow up appointments reviewed:  PCP Hospital f/u appt confirmed? No  Patient stated that he did not want to schedule a follow up at this time. Specialist Hospital f/u appt confirmed? Yes  Scheduled to see the neurosurgeon.  Are transportation arrangements needed? No  If their condition worsens, is the pt aware to call PCP or go to the Emergency Dept.? Yes Was the patient provided with contact information for the PCP's office or ED? Yes Was to pt encouraged to call back with questions or concerns? Yes

## 2021-01-19 ENCOUNTER — Encounter (HOSPITAL_BASED_OUTPATIENT_CLINIC_OR_DEPARTMENT_OTHER): Payer: Self-pay | Admitting: *Deleted

## 2021-01-19 ENCOUNTER — Emergency Department (HOSPITAL_BASED_OUTPATIENT_CLINIC_OR_DEPARTMENT_OTHER): Payer: BC Managed Care – PPO

## 2021-01-19 ENCOUNTER — Other Ambulatory Visit: Payer: Self-pay

## 2021-01-19 ENCOUNTER — Inpatient Hospital Stay (HOSPITAL_COMMUNITY): Payer: BC Managed Care – PPO

## 2021-01-19 ENCOUNTER — Inpatient Hospital Stay (HOSPITAL_BASED_OUTPATIENT_CLINIC_OR_DEPARTMENT_OTHER)
Admission: EM | Admit: 2021-01-19 | Discharge: 2021-01-25 | DRG: 378 | Disposition: A | Discharge status: Home / Self Care | Payer: BC Managed Care – PPO | Attending: Internal Medicine | Admitting: Internal Medicine

## 2021-01-19 DIAGNOSIS — Z8349 Family history of other endocrine, nutritional and metabolic diseases: Secondary | ICD-10-CM

## 2021-01-19 DIAGNOSIS — M4856XA Collapsed vertebra, not elsewhere classified, lumbar region, initial encounter for fracture: Secondary | ICD-10-CM | POA: Diagnosis present

## 2021-01-19 DIAGNOSIS — F10930 Alcohol use, unspecified with withdrawal, uncomplicated: Secondary | ICD-10-CM

## 2021-01-19 DIAGNOSIS — Z807 Family history of other malignant neoplasms of lymphoid, hematopoietic and related tissues: Secondary | ICD-10-CM

## 2021-01-19 DIAGNOSIS — S20219A Contusion of unspecified front wall of thorax, initial encounter: Secondary | ICD-10-CM | POA: Diagnosis not present

## 2021-01-19 DIAGNOSIS — W1830XA Fall on same level, unspecified, initial encounter: Secondary | ICD-10-CM | POA: Diagnosis present

## 2021-01-19 DIAGNOSIS — D72818 Other decreased white blood cell count: Secondary | ICD-10-CM | POA: Diagnosis present

## 2021-01-19 DIAGNOSIS — Z20822 Contact with and (suspected) exposure to covid-19: Secondary | ICD-10-CM | POA: Diagnosis not present

## 2021-01-19 DIAGNOSIS — R9431 Abnormal electrocardiogram [ECG] [EKG]: Secondary | ICD-10-CM | POA: Diagnosis present

## 2021-01-19 DIAGNOSIS — F10239 Alcohol dependence with withdrawal, unspecified: Secondary | ICD-10-CM | POA: Diagnosis not present

## 2021-01-19 DIAGNOSIS — D638 Anemia in other chronic diseases classified elsewhere: Secondary | ICD-10-CM | POA: Diagnosis not present

## 2021-01-19 DIAGNOSIS — R569 Unspecified convulsions: Secondary | ICD-10-CM | POA: Diagnosis not present

## 2021-01-19 DIAGNOSIS — D539 Nutritional anemia, unspecified: Secondary | ICD-10-CM | POA: Diagnosis not present

## 2021-01-19 DIAGNOSIS — R509 Fever, unspecified: Secondary | ICD-10-CM | POA: Diagnosis not present

## 2021-01-19 DIAGNOSIS — Y92013 Bedroom of single-family (private) house as the place of occurrence of the external cause: Secondary | ICD-10-CM | POA: Diagnosis not present

## 2021-01-19 DIAGNOSIS — K2970 Gastritis, unspecified, without bleeding: Secondary | ICD-10-CM | POA: Diagnosis not present

## 2021-01-19 DIAGNOSIS — R7989 Other specified abnormal findings of blood chemistry: Secondary | ICD-10-CM | POA: Diagnosis not present

## 2021-01-19 DIAGNOSIS — F17210 Nicotine dependence, cigarettes, uncomplicated: Secondary | ICD-10-CM | POA: Diagnosis present

## 2021-01-19 DIAGNOSIS — K259 Gastric ulcer, unspecified as acute or chronic, without hemorrhage or perforation: Secondary | ICD-10-CM

## 2021-01-19 DIAGNOSIS — I10 Essential (primary) hypertension: Secondary | ICD-10-CM | POA: Diagnosis present

## 2021-01-19 DIAGNOSIS — G4089 Other seizures: Secondary | ICD-10-CM | POA: Diagnosis not present

## 2021-01-19 DIAGNOSIS — F10939 Alcohol use, unspecified with withdrawal, unspecified: Secondary | ICD-10-CM

## 2021-01-19 DIAGNOSIS — K269 Duodenal ulcer, unspecified as acute or chronic, without hemorrhage or perforation: Secondary | ICD-10-CM | POA: Diagnosis not present

## 2021-01-19 DIAGNOSIS — Z79899 Other long term (current) drug therapy: Secondary | ICD-10-CM

## 2021-01-19 DIAGNOSIS — D696 Thrombocytopenia, unspecified: Secondary | ICD-10-CM | POA: Diagnosis not present

## 2021-01-19 DIAGNOSIS — F10231 Alcohol dependence with withdrawal delirium: Secondary | ICD-10-CM | POA: Diagnosis not present

## 2021-01-19 DIAGNOSIS — D689 Coagulation defect, unspecified: Secondary | ICD-10-CM | POA: Diagnosis not present

## 2021-01-19 DIAGNOSIS — I5030 Unspecified diastolic (congestive) heart failure: Secondary | ICD-10-CM | POA: Diagnosis not present

## 2021-01-19 DIAGNOSIS — F1010 Alcohol abuse, uncomplicated: Secondary | ICD-10-CM | POA: Diagnosis not present

## 2021-01-19 DIAGNOSIS — D62 Acute posthemorrhagic anemia: Secondary | ICD-10-CM | POA: Diagnosis present

## 2021-01-19 DIAGNOSIS — S36029A Unspecified contusion of spleen, initial encounter: Secondary | ICD-10-CM | POA: Diagnosis not present

## 2021-01-19 DIAGNOSIS — Z808 Family history of malignant neoplasm of other organs or systems: Secondary | ICD-10-CM | POA: Diagnosis not present

## 2021-01-19 DIAGNOSIS — F10230 Alcohol dependence with withdrawal, uncomplicated: Principal | ICD-10-CM

## 2021-01-19 DIAGNOSIS — S5011XA Contusion of right forearm, initial encounter: Secondary | ICD-10-CM | POA: Diagnosis present

## 2021-01-19 DIAGNOSIS — D6959 Other secondary thrombocytopenia: Secondary | ICD-10-CM | POA: Diagnosis present

## 2021-01-19 DIAGNOSIS — K922 Gastrointestinal hemorrhage, unspecified: Secondary | ICD-10-CM | POA: Diagnosis present

## 2021-01-19 DIAGNOSIS — K2971 Gastritis, unspecified, with bleeding: Secondary | ICD-10-CM | POA: Diagnosis not present

## 2021-01-19 DIAGNOSIS — K254 Chronic or unspecified gastric ulcer with hemorrhage: Secondary | ICD-10-CM | POA: Diagnosis not present

## 2021-01-19 DIAGNOSIS — S8002XA Contusion of left knee, initial encounter: Secondary | ICD-10-CM | POA: Diagnosis present

## 2021-01-19 DIAGNOSIS — Z8249 Family history of ischemic heart disease and other diseases of the circulatory system: Secondary | ICD-10-CM | POA: Diagnosis not present

## 2021-01-19 DIAGNOSIS — S8001XA Contusion of right knee, initial encounter: Secondary | ICD-10-CM | POA: Diagnosis present

## 2021-01-19 DIAGNOSIS — K2990 Gastroduodenitis, unspecified, without bleeding: Secondary | ICD-10-CM | POA: Diagnosis present

## 2021-01-19 DIAGNOSIS — K264 Chronic or unspecified duodenal ulcer with hemorrhage: Secondary | ICD-10-CM | POA: Diagnosis present

## 2021-01-19 LAB — URINALYSIS, ROUTINE W REFLEX MICROSCOPIC
Bilirubin Urine: NEGATIVE
Glucose, UA: NEGATIVE mg/dL
Leukocytes,Ua: NEGATIVE
Nitrite: NEGATIVE
Specific Gravity, Urine: 1.029 (ref 1.005–1.030)
pH: 5.5 (ref 5.0–8.0)

## 2021-01-19 LAB — CBC WITH DIFFERENTIAL/PLATELET
Abs Immature Granulocytes: 0.11 10*3/uL — ABNORMAL HIGH (ref 0.00–0.07)
Abs Immature Granulocytes: 0.1 10*3/uL — ABNORMAL HIGH (ref 0.00–0.07)
Abs Immature Granulocytes: 0.14 10*3/uL — ABNORMAL HIGH (ref 0.00–0.07)
Basophils Absolute: 0 10*3/uL (ref 0.0–0.1)
Basophils Absolute: 0 10*3/uL (ref 0.0–0.1)
Basophils Absolute: 0 10*3/uL (ref 0.0–0.1)
Basophils Relative: 0 %
Basophils Relative: 0 %
Basophils Relative: 0 %
Eosinophils Absolute: 0 10*3/uL (ref 0.0–0.5)
Eosinophils Absolute: 0 10*3/uL (ref 0.0–0.5)
Eosinophils Absolute: 0 10*3/uL (ref 0.0–0.5)
Eosinophils Relative: 0 %
Eosinophils Relative: 0 %
Eosinophils Relative: 0 %
HCT: 23.5 % — ABNORMAL LOW (ref 39.0–52.0)
HCT: 19.6 % — ABNORMAL LOW (ref 39.0–52.0)
HCT: 20.3 % — ABNORMAL LOW (ref 39.0–52.0)
Hemoglobin: 8.3 g/dL — ABNORMAL LOW (ref 13.0–17.0)
Hemoglobin: 6.7 g/dL — CL (ref 13.0–17.0)
Hemoglobin: 7.1 g/dL — ABNORMAL LOW (ref 13.0–17.0)
Immature Granulocytes: 1 %
Immature Granulocytes: 1 %
Immature Granulocytes: 2 %
Lymphocytes Relative: 7 %
Lymphocytes Relative: 11 %
Lymphocytes Relative: 10 %
Lymphs Abs: 0.6 10*3/uL — ABNORMAL LOW (ref 0.7–4.0)
Lymphs Abs: 0.9 10*3/uL (ref 0.7–4.0)
Lymphs Abs: 0.8 10*3/uL (ref 0.7–4.0)
MCH: 36.6 pg — ABNORMAL HIGH (ref 26.0–34.0)
MCH: 36 pg — ABNORMAL HIGH (ref 26.0–34.0)
MCH: 37.6 pg — ABNORMAL HIGH (ref 26.0–34.0)
MCHC: 35.3 g/dL (ref 30.0–36.0)
MCHC: 34.2 g/dL (ref 30.0–36.0)
MCHC: 35 g/dL (ref 30.0–36.0)
MCV: 103.5 fL — ABNORMAL HIGH (ref 80.0–100.0)
MCV: 105.4 fL — ABNORMAL HIGH (ref 80.0–100.0)
MCV: 107.4 fL — ABNORMAL HIGH (ref 80.0–100.0)
Monocytes Absolute: 1 10*3/uL (ref 0.1–1.0)
Monocytes Absolute: 1.2 10*3/uL — ABNORMAL HIGH (ref 0.1–1.0)
Monocytes Absolute: 1.2 10*3/uL — ABNORMAL HIGH (ref 0.1–1.0)
Monocytes Relative: 11 %
Monocytes Relative: 16 %
Monocytes Relative: 15 %
Neutro Abs: 7.4 10*3/uL (ref 1.7–7.7)
Neutro Abs: 5.4 10*3/uL (ref 1.7–7.7)
Neutro Abs: 5.7 10*3/uL (ref 1.7–7.7)
Neutrophils Relative %: 81 %
Neutrophils Relative %: 72 %
Neutrophils Relative %: 73 %
Platelet Morphology: NORMAL
Platelets: 129 10*3/uL — ABNORMAL LOW (ref 150–400)
Platelets: 100 10*3/uL — ABNORMAL LOW (ref 150–400)
RBC: 2.27 MIL/uL — ABNORMAL LOW (ref 4.22–5.81)
RBC: 1.86 MIL/uL — ABNORMAL LOW (ref 4.22–5.81)
RBC: 1.89 MIL/uL — ABNORMAL LOW (ref 4.22–5.81)
RDW: 12.4 % (ref 11.5–15.5)
RDW: 12.7 % (ref 11.5–15.5)
RDW: 12.7 % (ref 11.5–15.5)
WBC: 9.2 10*3/uL (ref 4.0–10.5)
WBC: 7.6 10*3/uL (ref 4.0–10.5)
WBC: 7.8 10*3/uL (ref 4.0–10.5)
nRBC: 0.2 % (ref 0.0–0.2)
nRBC: 0.5 % — ABNORMAL HIGH (ref 0.0–0.2)
nRBC: 0.6 % — ABNORMAL HIGH (ref 0.0–0.2)

## 2021-01-19 LAB — COMPREHENSIVE METABOLIC PANEL
ALT: 38 U/L (ref 0–44)
ALT: 37 U/L (ref 0–44)
AST: 45 U/L — ABNORMAL HIGH (ref 15–41)
AST: 44 U/L — ABNORMAL HIGH (ref 15–41)
Albumin: 4.4 g/dL (ref 3.5–5.0)
Albumin: 3.5 g/dL (ref 3.5–5.0)
Alkaline Phosphatase: 67 U/L (ref 38–126)
Alkaline Phosphatase: 60 U/L (ref 38–126)
Anion gap: 16 — ABNORMAL HIGH (ref 5–15)
Anion gap: 8 (ref 5–15)
BUN: 48 mg/dL — ABNORMAL HIGH (ref 8–23)
BUN: 32 mg/dL — ABNORMAL HIGH (ref 8–23)
CO2: 22 mmol/L (ref 22–32)
CO2: 22 mmol/L (ref 22–32)
Calcium: 9.4 mg/dL (ref 8.9–10.3)
Calcium: 8.6 mg/dL — ABNORMAL LOW (ref 8.9–10.3)
Chloride: 100 mmol/L (ref 98–111)
Chloride: 105 mmol/L (ref 98–111)
Creatinine, Ser: 1 mg/dL (ref 0.61–1.24)
Creatinine, Ser: 0.9 mg/dL (ref 0.61–1.24)
GFR, Estimated: >60 mL/min (ref >60)
GFR, Estimated: >60 mL/min (ref >60)
Glucose, Bld: 182 mg/dL — ABNORMAL HIGH (ref 70–99)
Glucose, Bld: 125 mg/dL — ABNORMAL HIGH (ref 70–99)
Potassium: 3.6 mmol/L (ref 3.5–5.1)
Potassium: 3.5 mmol/L (ref 3.5–5.1)
Sodium: 138 mmol/L (ref 135–145)
Sodium: 135 mmol/L (ref 135–145)
Total Bilirubin: 1.7 mg/dL — ABNORMAL HIGH (ref 0.3–1.2)
Total Bilirubin: 1.6 mg/dL — ABNORMAL HIGH (ref 0.3–1.2)
Total Protein: 7.4 g/dL (ref 6.5–8.1)
Total Protein: 6.3 g/dL — ABNORMAL LOW (ref 6.5–8.1)

## 2021-01-19 LAB — RAPID URINE DRUG SCREEN, HOSP PERFORMED
Amphetamines: NOT DETECTED
Amphetamines: NOT DETECTED
Barbiturates: NOT DETECTED
Barbiturates: NOT DETECTED
Benzodiazepines: NOT DETECTED
Benzodiazepines: POSITIVE — AB
Cocaine: NOT DETECTED
Cocaine: NOT DETECTED
Opiates: NOT DETECTED
Opiates: NOT DETECTED
Tetrahydrocannabinol: NOT DETECTED
Tetrahydrocannabinol: NOT DETECTED

## 2021-01-19 LAB — RESP PANEL BY RT-PCR (FLU A&B, COVID) ARPGX2
Influenza A by PCR: NEGATIVE
Influenza B by PCR: NEGATIVE
SARS Coronavirus 2 by RT PCR: NEGATIVE

## 2021-01-19 LAB — LIPASE, BLOOD: Lipase: 49 U/L (ref 11–51)

## 2021-01-19 LAB — MAGNESIUM
Magnesium: 1.2 mg/dL — ABNORMAL LOW (ref 1.7–2.4)
Magnesium: 1.8 mg/dL (ref 1.7–2.4)

## 2021-01-19 LAB — LACTIC ACID, PLASMA
Lactic Acid, Venous: 3.1 mmol/L (ref 0.5–1.9)
Lactic Acid, Venous: 2.7 mmol/L (ref 0.5–1.9)

## 2021-01-19 LAB — PROTIME-INR
INR: 1.4 — ABNORMAL HIGH (ref 0.8–1.2)
Prothrombin Time: 16.8 seconds — ABNORMAL HIGH (ref 11.4–15.2)

## 2021-01-19 LAB — ABO/RH: ABO/RH(D): O POS

## 2021-01-19 LAB — PREPARE RBC (CROSSMATCH)

## 2021-01-19 LAB — PHOSPHORUS: Phosphorus: 2.3 mg/dL — ABNORMAL LOW (ref 2.5–4.6)

## 2021-01-19 LAB — ETHANOL
Alcohol, Ethyl (B): <10 mg/dL (ref <10)
Alcohol, Ethyl (B): <10 mg/dL (ref <10)

## 2021-01-19 LAB — TROPONIN I (HIGH SENSITIVITY)
Troponin I (High Sensitivity): 4 ng/L (ref <18)
Troponin I (High Sensitivity): 5 ng/L (ref <18)

## 2021-01-19 LAB — AMMONIA: Ammonia: 23 umol/L (ref 9–35)

## 2021-01-19 LAB — OCCULT BLOOD X 1 CARD TO LAB, STOOL: Fecal Occult Bld: POSITIVE — AB

## 2021-01-19 MED ORDER — ADULT MULTIVITAMIN W/MINERALS CH
1.0000 | ORAL_TABLET | Freq: Every day | ORAL | Status: DC
  Administered 2021-01-19 – 2021-01-20 (×2): 1 via ORAL
  Filled 2021-01-19 (×2): qty 1

## 2021-01-19 MED ORDER — IOHEXOL 300 MG/ML  SOLN
80.0000 mL | Freq: Once | INTRAMUSCULAR | Status: AC | PRN
  Administered 2021-01-19: 80 mL via INTRAVENOUS

## 2021-01-19 MED ORDER — LORAZEPAM 1 MG PO TABS
1.0000 mg | ORAL_TABLET | ORAL | Status: AC | PRN
  Filled 2021-01-19: qty 4

## 2021-01-19 MED ORDER — SODIUM CHLORIDE 0.9 % IV SOLN
10.0000 mL/h | Freq: Once | INTRAVENOUS | Status: DC
Start: 2021-01-19 — End: 2021-01-25

## 2021-01-19 MED ORDER — THIAMINE HCL 100 MG/ML IJ SOLN
100.0000 mg | Freq: Every day | INTRAMUSCULAR | Status: DC
  Administered 2021-01-21 – 2021-01-22 (×2): 100 mg via INTRAVENOUS
  Filled 2021-01-19 (×2): qty 2

## 2021-01-19 MED ORDER — LORAZEPAM 2 MG/ML IJ SOLN
1.0000 mg | INTRAMUSCULAR | Status: AC | PRN
  Administered 2021-01-20: 2 mg via INTRAVENOUS
  Administered 2021-01-20: 1 mg via INTRAVENOUS
  Administered 2021-01-20: 4 mg via INTRAVENOUS
  Administered 2021-01-20 (×2): 3 mg via INTRAVENOUS
  Administered 2021-01-20 – 2021-01-21 (×2): 2 mg via INTRAVENOUS
  Administered 2021-01-21 (×5): 4 mg via INTRAVENOUS
  Administered 2021-01-21: 2 mg via INTRAVENOUS
  Filled 2021-01-19: qty 2
  Filled 2021-01-19: qty 1
  Filled 2021-01-19 (×5): qty 2
  Filled 2021-01-19: qty 1
  Filled 2021-01-19 (×3): qty 2
  Filled 2021-01-19 (×2): qty 1

## 2021-01-19 MED ORDER — SODIUM CHLORIDE 0.9 % IV BOLUS
1000.0000 mL | Freq: Once | INTRAVENOUS | Status: AC
  Administered 2021-01-19: 1000 mL via INTRAVENOUS

## 2021-01-19 MED ORDER — PANTOPRAZOLE INFUSION (NEW) - SIMPLE MED
8.0000 mg/h | INTRAVENOUS | Status: DC
  Administered 2021-01-19 – 2021-01-22 (×7): 8 mg/h via INTRAVENOUS
  Filled 2021-01-19: qty 80
  Filled 2021-01-19: qty 100
  Filled 2021-01-19 (×2): qty 80
  Filled 2021-01-19: qty 100
  Filled 2021-01-19 (×2): qty 80
  Filled 2021-01-19 (×2): qty 100

## 2021-01-19 MED ORDER — MAGNESIUM SULFATE 50 % IJ SOLN: 3.0000 g | Freq: Once | INTRAVENOUS | Status: DC

## 2021-01-19 MED ORDER — PANTOPRAZOLE 80MG IVPB - SIMPLE MED
80.0000 mg | Freq: Once | INTRAVENOUS | Status: AC
  Administered 2021-01-19: 80 mg via INTRAVENOUS
  Filled 2021-01-19: qty 80

## 2021-01-19 MED ORDER — THIAMINE HCL 100 MG/ML IJ SOLN
100.0000 mg | Freq: Once | INTRAMUSCULAR | Status: AC
  Administered 2021-01-19: 100 mg via INTRAVENOUS
  Filled 2021-01-19: qty 2

## 2021-01-19 MED ORDER — PANTOPRAZOLE SODIUM 40 MG IV SOLR
40.0000 mg | Freq: Once | INTRAVENOUS | Status: AC
  Administered 2021-01-19: 40 mg via INTRAVENOUS
  Filled 2021-01-19: qty 40

## 2021-01-19 MED ORDER — THIAMINE HCL 100 MG PO TABS
100.0000 mg | ORAL_TABLET | Freq: Every day | ORAL | Status: DC
  Administered 2021-01-19 – 2021-01-25 (×5): 100 mg via ORAL
  Filled 2021-01-19 (×5): qty 1

## 2021-01-19 MED ORDER — ACETAMINOPHEN 325 MG PO TABS
650.0000 mg | ORAL_TABLET | Freq: Four times a day (QID) | ORAL | Status: DC | PRN
  Administered 2021-01-22 – 2021-01-25 (×2): 650 mg via ORAL
  Filled 2021-01-19 (×2): qty 2

## 2021-01-19 MED ORDER — MIDAZOLAM HCL 2 MG/2ML IJ SOLN
2.0000 mg | Freq: Once | INTRAMUSCULAR | Status: AC
  Administered 2021-01-19: 2 mg via INTRAVENOUS
  Filled 2021-01-19: qty 2

## 2021-01-19 MED ORDER — PANTOPRAZOLE SODIUM 40 MG IV SOLR: 40.0000 mg | Freq: Two times a day (BID) | INTRAVENOUS | Status: DC

## 2021-01-19 MED ORDER — MAGNESIUM SULFATE 2 GM/50ML IV SOLN
2.0000 g | Freq: Once | INTRAVENOUS | Status: AC
  Administered 2021-01-19: 2 g via INTRAVENOUS
  Filled 2021-01-19: qty 50

## 2021-01-19 MED ORDER — SODIUM CHLORIDE 0.9% IV SOLUTION: Freq: Once | INTRAVENOUS | Status: DC

## 2021-01-19 MED ORDER — LORAZEPAM 2 MG/ML IJ SOLN
0.0000 mg | INTRAMUSCULAR | Status: AC
  Administered 2021-01-20: 2 mg via INTRAVENOUS
  Administered 2021-01-20: 4 mg via INTRAVENOUS
  Administered 2021-01-20 – 2021-01-21 (×3): 2 mg via INTRAVENOUS
  Filled 2021-01-19: qty 1
  Filled 2021-01-19: qty 2
  Filled 2021-01-19 (×3): qty 1
  Filled 2021-01-19 (×2): qty 2

## 2021-01-19 MED ORDER — LORAZEPAM 2 MG/ML IJ SOLN
0.0000 mg | Freq: Three times a day (TID) | INTRAMUSCULAR | Status: AC
  Administered 2021-01-21: 4 mg via INTRAVENOUS
  Administered 2021-01-22: 2 mg via INTRAVENOUS
  Administered 2021-01-22: 1 mg via INTRAVENOUS
  Filled 2021-01-19 (×2): qty 1

## 2021-01-19 MED ORDER — FOLIC ACID 1 MG PO TABS
1.0000 mg | ORAL_TABLET | Freq: Every day | ORAL | Status: DC
  Administered 2021-01-19 – 2021-01-25 (×5): 1 mg via ORAL
  Filled 2021-01-19 (×5): qty 1

## 2021-01-19 MED ORDER — OXYCODONE HCL 5 MG PO TABS
5.0000 mg | ORAL_TABLET | ORAL | Status: DC | PRN
Start: 2021-01-19 — End: 2021-01-25
  Administered 2021-01-19 – 2021-01-25 (×8): 5 mg via ORAL
  Filled 2021-01-19 (×8): qty 1

## 2021-01-19 MED ORDER — ACETAMINOPHEN 650 MG RE SUPP: 650.0000 mg | Freq: Four times a day (QID) | RECTAL | Status: DC | PRN

## 2021-01-19 MED ORDER — SODIUM CHLORIDE 0.9 % IV SOLN: INTRAVENOUS | Status: DC

## 2021-01-19 NOTE — ED Triage Notes (Signed)
Patient arrived via EMS from the fall this morning.

## 2021-01-19 NOTE — Progress Notes (Signed)
  Echocardiogram 2D Echocardiogram has been performed.  Richard Andrews F 01/19/2021, 5:52 PM

## 2021-01-19 NOTE — Progress Notes (Signed)
Received a phone call from Facility: [facility]  Requesting MD: Dr. Delford Field Patient with h/o HTN, alcohol abuse, thrombocytopenia, macrocytic anemia  presenting with fall, but found to have upper GI bleed with alcohol withdrawal. Gi was already consulted. Given IV fluids, protonix x1 and replaced magnesium. Last drink was 3 days ago. Also known L1 vertebral body fracture that has progressed from 01/11/21. Possible trace subcapsular hematoma of spleen on Ct.   He may be on board for some help with drinking. Social work consult when he gets here. Will let GI know when he arrives.   Vitals stable. Last bp: 124/83, HR: 86, RR: 18 and 100% on RA.  Pertinent labs: Bun of 48, hgb of 8.3, +fecal occult, lactic acid of 3.1--->2.7  Plan of care: admit to [facility] on progressive.   The patient will be accepted for admission to progressive at [facility] when bed is available.  -asked that EDP obtain another H&H and follow.    Nursing staff, Please call the [facility] Admits & Consults System-Wide number at the top of Amion at the time of the patient's arrival so that the patient can be paged to the admitting physician.   Lanney Gins M.D. [facility]

## 2021-01-19 NOTE — ED Provider Notes (Signed)
[facility] Provider Note   CSN: [id number] Arrival date & time: 01/19/21  1601     History Chief Complaint  Patient presents with   Richard Andrews is a 60 y.o. male.  Richard Andrews has a history of alcohol dependence and drinks at least 1/5 of alcohol per day.  He presents after a fall from standing height.  He was walking out of his bedroom, and his legs gave way.  He fell face forward onto a hardwood floor striking his nose.  He recently fell and presented to the ER where he was diagnosed with an L1 compression fracture.  He denies any new pain today but states that his back has been hurting for the past week.  He admits that his alcohol use is a concern for his family members.  He has tried two inpatient rehab courses, and he relapsed with both.  He is tacitly amenable to further treatment.  The history is provided by the patient and a relative (daughter).  Fall This is a new problem. The current episode started less than 1 hour ago. The problem occurs constantly. The problem has been resolved. Pertinent negatives include no chest pain, no abdominal pain, no headaches and no shortness of breath. Nothing aggravates the symptoms. Nothing relieves the symptoms. He has tried nothing for the symptoms. The treatment provided no relief.      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   Alcohol dependence ([place]) 02/12/2016   Essential  hypertension 01/22/2015    Past Surgical History:  Procedure Laterality Date   I & D EXTREMITY Right 01/10/2020   Procedure: ORIF RIGHT WRIST;  Surgeon: Roseanne Kaufman, MD;  Location: [facility];  Service: Orthopedics;  Laterality: Right;   OPEN REDUCTION INTERNAL FIXATION (ORIF) DISTAL RADIAL FRACTURE Left 01/28/2015   Procedure: OPEN REDUCTION INTERNAL FIXATION (ORIF) LEFT DISTAL RADIAL FRACTURE;  Surgeon: Leandrew Koyanagi, MD;  Location: [place];  Service: Orthopedics;  Laterality: Left;   TONSILLECTOMY         Family History  Problem Relation Age of Onset   Heart disease Mother    Hyperlipidemia Mother    Skin cancer Mother    Cancer Father    Multiple myeloma Sister    Throat cancer Sister     Social History   Tobacco Use   Smoking status: Every Day    Packs/day: 1.00    Years: 10.00    Pack years: 10.00    Types: Cigarettes   Smokeless tobacco: Current    Types: Chew  Vaping Use   Vaping Use: Never used  Substance Use Topics   Alcohol use: Yes    Alcohol/week: 16.0 - 20.0 standard drinks    Types: 16 - 20 Standard drinks or equivalent per week    Comment: 1 gallon/week   Drug use: Never    Home Medications Prior to Admission medications   Medication Sig Start  Date End Date Taking? Authorizing Provider  Ascorbic Acid (VITAMIN C) 1000 MG tablet Take 1,000 mg by mouth daily.   Yes [provider]  thiamine (VITAMIN B-1) 100 MG tablet Take 1 tablet (100 mg total) by mouth daily. 02/24/20  Yes Samuel Bouche, NP  vitamin B-12 (CYANOCOBALAMIN) 1000 MCG tablet Take 1 tablet (1,000 mcg total) by mouth daily. 02/24/20  Yes Samuel Bouche, NP  ibuprofen (ADVIL) 200 MG tablet Take 400 mg by mouth See admin instructions. Take two tablets (400 mg) by mouth in the morning on work days    [provider]    Allergies    Patient has no known allergies.  Review of Systems   Review of Systems  Constitutional:  Positive for unexpected weight change  (weight loss). Negative for chills and fever.  HENT:  Negative for ear pain and sore throat.   Eyes:  Negative for pain and visual disturbance.  Respiratory:  Negative for cough and shortness of breath.   Cardiovascular:  Negative for chest pain and palpitations.  Gastrointestinal:  Negative for abdominal pain and vomiting.  Genitourinary:  Negative for dysuria and hematuria.  Musculoskeletal:  Negative for arthralgias and back pain.  Skin:  Negative for color change and rash.  Neurological:  Positive for weakness. Negative for seizures, syncope and headaches.  Hematological:  Bruises/bleeds easily.  All other systems reviewed and are negative.  Physical Exam Updated Vital Signs BP (!) 155/98 (BP Location: Left Arm)   Pulse (!) 114   Temp 98.7 F (37.1 C) (Oral)   Resp 17   Ht '5\' 9"'  (1.753 m)   Wt 68.2 kg   SpO2 99%   BMI 22.20 kg/m   Physical Exam Constitutional:      Appearance: He is ill-appearing.     Comments: Thin, lacks subcutaneous fat  HENT:     Head: Normocephalic.     Nose:     Comments: Small abrasion bridge of nose    Mouth/Throat:     Mouth: Mucous membranes are dry.     Comments: Poor dentition; no trauma Eyes:     Extraocular Movements: Extraocular movements intact.     Conjunctiva/sclera: Conjunctivae normal.  Cardiovascular:     Rate and Rhythm: Regular rhythm. Tachycardia present.  Pulmonary:     Effort: Pulmonary effort is normal. No respiratory distress.     Breath sounds: No wheezing.  Abdominal:     General: Abdomen is flat. There is no distension.     Palpations: Abdomen is soft.     Tenderness: There is no abdominal tenderness.  Genitourinary:    Comments: Melenic stool over rectum  Musculoskeletal:        General: No swelling or deformity. Normal range of motion.     Cervical back: Normal range of motion. No tenderness.  Skin:    Coloration: Skin is jaundiced.     Comments: Multiple large bruises over extremities especially right  forearm and bilateral knees  Neurological:     General: No focal deficit present.     Mental Status: He is oriented to person, place, and time.     Comments: He is mildly tremulous.  He is barely able to support himself in a seated position on the stretcher.  He is able to stand, but it requires great effort.  He could take no more than 1 step before he collapsed back onto the bed.  Psychiatric:        Mood and Affect: Mood normal.  Behavior: Behavior normal.    ED Results / Procedures / Treatments   Labs (all labs ordered are listed, but only abnormal results are displayed) Labs Reviewed  COMPREHENSIVE METABOLIC PANEL - Abnormal; Notable for the following components:      Result Value   Glucose, Bld 182 (*)    BUN 48 (*)    AST 45 (*)    Total Bilirubin 1.7 (*)    Anion gap 16 (*)    All other components within normal limits  LACTIC ACID, PLASMA - Abnormal; Notable for the following components:   Lactic Acid, Venous 3.1 (*)    All other components within normal limits  LACTIC ACID, PLASMA - Abnormal; Notable for the following components:   Lactic Acid, Venous 2.7 (*)    All other components within normal limits  CBC WITH DIFFERENTIAL/PLATELET - Abnormal; Notable for the following components:   RBC 2.27 (*)    Hemoglobin 8.3 (*)    HCT 23.5 (*)    MCV 103.5 (*)    MCH 36.6 (*)    Platelets 129 (*)    Lymphs Abs 0.6 (*)    Abs Immature Granulocytes 0.11 (*)    All other components within normal limits  PROTIME-INR - Abnormal; Notable for the following components:   Prothrombin Time 16.8 (*)    INR 1.4 (*)    All other components within normal limits  URINALYSIS, ROUTINE W REFLEX MICROSCOPIC - Abnormal; Notable for the following components:   Hgb urine dipstick MODERATE (*)    Ketones, ur TRACE (*)    Protein, ur TRACE (*)    All other components within normal limits  MAGNESIUM - Abnormal; Notable for the following components:   Magnesium 1.2 (*)    All other  components within normal limits  OCCULT BLOOD X 1 CARD TO LAB, STOOL - Abnormal; Notable for the following components:   Fecal Occult Bld POSITIVE (*)    All other components within normal limits  RESP PANEL BY RT-PCR (FLU A&B, COVID) ARPGX2  ETHANOL  LIPASE, BLOOD  RAPID URINE DRUG SCREEN, HOSP PERFORMED  CBC WITH DIFFERENTIAL/PLATELET  TROPONIN I (HIGH SENSITIVITY)  TROPONIN I (HIGH SENSITIVITY)    EKG EKG Interpretation  Date/Time:  Tuesday January 19 2021 08:33:10 EDT Ventricular Rate:  111 PR Interval:  137 QRS Duration: 89 QT Interval:  320 QTC Calculation: 435 R Axis:   62 Text Interpretation: Sinus tachycardia Nonspecific repol abnormality, diffuse leads normal axis repol abnormalities new when compared to prior Confirmed by Lorre Munroe ([id number]) on 01/19/2021 10:46:49 AM  Radiology No results found.  Procedures .Critical Care  Date/Time: 01/19/2021 1:01 PM Performed by: Arnaldo Natal, MD Authorized by: Arnaldo Natal, MD   Critical care provider statement:    Critical care time (minutes):  45   Critical care time was exclusive of:  Separately billable procedures and treating other patients and teaching time   Critical care was necessary to treat or prevent imminent or life-threatening deterioration of the following conditions: symptomatic anemia, alcohol withdrawal.   Critical care was time spent personally by me on the following activities:  Discussions with consultants, evaluation of patient's response to treatment, examination of patient, ordering and performing treatments and interventions, ordering and review of laboratory studies, ordering and review of radiographic studies, pulse oximetry, re-evaluation of patient's condition, obtaining history from patient or surrogate and review of old charts   Care discussed with: admitting provider and accepting provider at another facility  Medications Ordered in ED Medications  pantoprazole (PROTONIX) injection 40 mg (has  no administration in time range)  sodium chloride 0.9 % bolus 1,000 mL (0 mLs Intravenous Stopped 01/19/21 1053)  midazolam (VERSED) injection 2 mg (2 mg Intravenous Given 01/19/21 0917)  thiamine (B-1) injection 100 mg (100 mg Intravenous Given 01/19/21 0915)  magnesium sulfate IVPB 2 g 50 mL (0 g Intravenous Stopped 01/19/21 1211)  iohexol (OMNIPAQUE) 300 MG/ML solution 80 mL (80 mLs Intravenous Contrast Given 01/19/21 1103)    ED Course  I have reviewed the triage vital signs and the nursing notes.  Pertinent labs & imaging results that were available during my care of the patient were reviewed by me and considered in my medical decision making (see chart for details).  Clinical Course as of 01/19/21 1259  Tue Jan 19, 2021  1229 I spoke with Magda Paganini with [facility].  [AW]  1254 Lactic Acid, Venous(!!): 2.7 [AW]  1258 I spoke with Dr. Rogers Blocker who will accept the patient for admission. [AW]    Clinical Course User Index [AW] Arnaldo Natal, MD   MDM Rules/Calculators/A&P                           Richard Andrews presented to the ED after a fall this morning.  He was exceedingly weak, and his legs gave way.  He had symptoms of alcohol withdrawal as his last drink was several days prior to arrival.  He had also been to the ER recently with another fall and had sustained an L1 fracture.  Here in the ED he was treated for alcohol withdrawal and given IV hydration.  Lab work-up revealed a significant drop in his hemoglobin from his recent labs.  He was scanned to evaluate for evidence of traumatic bleed such as a retroperitoneal hematoma.  There was a small, subcapsular splenic hematoma, but no large bleed.  Rectal exam revealed melanotic stool, and an upper GI source is likely the cause of his drop in hemoglobin.  He was given Protonix.  He does not yet meet criteria for transfusion.  GI was called and will consult when the patient arrives in the hospital.  He was also given thiamine and magnesium.  He was  placed on a CIWA protocol. Final Clinical Impression(s) / ED Diagnoses Final diagnoses:  Chest wall contusion  Alcohol withdrawal syndrome without complication (HCC)  Upper GI bleed  Hypomagnesemia  Acute blood loss anemia    Rx / DC Orders ED Discharge Orders     None        Arnaldo Natal, MD 01/19/21 [phone]

## 2021-01-19 NOTE — H&P (Signed)
[facility] History and Physical  Richard Andrews SNK:[id number] DOB: 1960/10/18 DOA: 01/19/2021  Referring physician:  PCP: Samuel Bouche, NP   Chief Complaint: EtOH abuse, GI bleed  HPI:  60 yo WM PMHx EtOH abuse, (drinks at least 1/5 of alcohol per day).  Increased ammonia level, thrombocytopenia, macrocytic anemia  Presents after a fall from standing height.  He was walking out of his bedroom, and his legs gave way.  He fell face forward onto a hardwood floor striking his nose.  He recently fell and presented to the ER where he was diagnosed with an L1 compression fracture.  He denies any new pain today but states that his back has been hurting for the past week.  He admits that his alcohol use is a concern for his family members.  He has tried two inpatient rehab courses, and he relapsed with both.  He is tacitly amenable to further treatment.   The history is provided by the patient and a relative (daughter).  Fall This is a new problem. The current episode started less than 1 hour ago. The problem occurs constantly. The problem has been resolved. Pertinent negatives include no chest pain, no abdominal pain, no headaches and no shortness of breath. Nothing aggravates the symptoms. Nothing relieves the symptoms. He has tried nothing for the symptoms. The treatment provided no relief.    Review of Systems:  Covid vaccination; vaccinated 2/4  Constitutional:  No weight loss, night sweats, Fevers, chills, fatigue.  HEENT:  No headaches, Difficulty swallowing,Tooth/dental problems,Sore throat,  No sneezing, itching, ear ache, nasal congestion, post nasal drip,  Cardio-vascular:  No chest pain, Orthopnea, PND, swelling in lower extremities, anasarca, dizziness, palpitations  GI:  No heartburn, indigestion, abdominal pain, nausea, vomiting, diarrhea, Positive change in bowel habits (melena), loss of appetite  Resp:  No shortness of breath with exertion or at rest. No excess mucus, no  productive cough, No non-productive cough, No coughing up of blood.No change in color of mucus.No wheezing.No chest wall deformity  Skin:  no rash or lesions.  GU:  no dysuria, change in color of urine, no urgency or frequency. No flank pain.  Musculoskeletal:  No joint pain or swelling. No decreased range of motion.  Positive o back pain.  Psych:  No change in mood or affect. No depression or anxiety. No memory loss.   Past Medical History:  Diagnosis Date   Allergy    Distal radius fracture, left    Hypertension    Past Surgical History:  Procedure Laterality Date   I & D EXTREMITY Right 01/10/2020   Procedure: ORIF RIGHT WRIST;  Surgeon: Roseanne Kaufman, MD;  Location: [facility];  Service: Orthopedics;  Laterality: Right;   OPEN REDUCTION INTERNAL FIXATION (ORIF) DISTAL RADIAL FRACTURE Left 01/28/2015   Procedure: OPEN REDUCTION INTERNAL FIXATION (ORIF) LEFT DISTAL RADIAL FRACTURE;  Surgeon: Leandrew Koyanagi, MD;  Location: [facility];  Service: Orthopedics;  Laterality: Left;   TONSILLECTOMY     Social History:  reports that he has been smoking cigarettes. He has a 10.00 pack-year smoking history. His smokeless tobacco use includes chew. He reports current alcohol use of about 16.0 - 20.0 standard drinks of alcohol per week. He reports that he does not use drugs.  No Known Allergies  Family History  Problem Relation Age of Onset   Heart disease Mother    Hyperlipidemia Mother    Skin cancer Mother    Cancer Father    Multiple myeloma  Sister    Throat cancer Sister      Prior to Admission medications   Medication Sig Start Date End Date Taking? Authorizing Provider  Ascorbic Acid (VITAMIN C) 1000 MG tablet Take 1,000 mg by mouth daily.   Yes [provider]  thiamine (VITAMIN B-1) 100 MG tablet Take 1 tablet (100 mg total) by mouth daily. 02/24/20  Yes Samuel Bouche, NP  vitamin B-12 (CYANOCOBALAMIN) 1000 MCG tablet Take 1 tablet (1,000 mcg total) by mouth  daily. 02/24/20  Yes Samuel Bouche, NP  ibuprofen (ADVIL) 200 MG tablet Take 400 mg by mouth See admin instructions. Take two tablets (400 mg) by mouth in the morning on work days    [provider]     Consultants:  [facility]  Procedures/Significant Events:    I have personally reviewed and interpreted all radiology studies and my findings are as above.   VENTILATOR SETTINGS:    Cultures   Antimicrobials:    Devices    LINES / TUBES:      Continuous Infusions:  sodium chloride     sodium chloride 150 mL/hr at 01/19/21 1621   pantoprazole 80 mg (01/19/21 1849)   pantoprazole 8 mg/hr (01/19/21 1848)    Physical Exam: Vitals:   01/19/21 1229 01/19/21 1400 01/19/21 1415 01/19/21 1515  BP: 124/83 (!) 144/77  (!) 134/91  Pulse: 86 96 89 88  Resp: 18   19  Temp:    98.4 F (36.9 C)  TempSrc:    Oral  SpO2: 100% 100% 96% 100%  Weight:    61.6 kg  Height:    6' (1.829 m)    Wt Readings from Last 3 Encounters:  01/19/21 61.6 kg  06/22/20 68.2 kg  02/06/20 63.8 kg    General: A/O x4, No acute respiratory distress Eyes: negative scleral hemorrhage, negative anisocoria, negative icterus ENT: Negative Runny nose, negative gingival bleeding, Neck:  Negative scars, masses, torticollis, lymphadenopathy, JVD Lungs: Clear to auscultation bilaterally without wheezes or crackles Cardiovascular: Regular rate and rhythm without murmur gallop or rub normal S1 and S2 Abdomen: negative abdominal pain, nondistended, positive soft, bowel sounds, no rebound, no ascites, no appreciable mass Extremities: No significant cyanosis, clubbing, or edema bilateral lower extremities Skin: Negative rashes, lesions, ulcers Psychiatric:  Negative depression, negative anxiety, negative fatigue, negative mania  Central nervous system:  Cranial nerves II through XII intact, tongue/uvula midline, all extremities muscle strength 5/5, sensation intact throughout, negative dysarthria,  negative expressive aphasia, negative receptive aphasia.        Labs on Admission:  Basic Metabolic Panel: Recent Labs  Lab 01/19/21 0920  NA 138  K 3.6  CL 100  CO2 22  GLUCOSE 182*  BUN 48*  CREATININE 1.00  CALCIUM 9.4  MG 1.2*   Liver Function Tests: Recent Labs  Lab 01/19/21 0920  AST 45*  ALT 38  ALKPHOS 67  BILITOT 1.7*  PROT 7.4  ALBUMIN 4.4   Recent Labs  Lab 01/19/21 0920  LIPASE 49   No results for input(s): AMMONIA in the last 168 hours. CBC: Recent Labs  Lab 01/19/21 0920 01/19/21 1323 01/19/21 1756  WBC 9.2 7.6 7.8  NEUTROABS 7.4 5.4 PENDING  HGB 8.3* 6.7* 7.1*  HCT 23.5* 19.6* 20.3*  MCV 103.5* 105.4* 107.4*  PLT 129* 100* PENDING   Cardiac Enzymes: No results for input(s): CKTOTAL, CKMB, CKMBINDEX, TROPONINI in the last 168 hours.  BNP (last 3 results) No results for input(s): BNP in the  last 8760 hours.  ProBNP (last 3 results) No results for input(s): PROBNP in the last 8760 hours.  CBG: No results for input(s): GLUCAP in the last 168 hours.  Radiological Exams on Admission: DG Chest 1 View  Result Date: 01/19/2021 CLINICAL DATA:  Evaluate for pneumonia, recent fall EXAM: CHEST  1 VIEW COMPARISON:  None. FINDINGS: The heart size and mediastinal contours are within normal limits. Both lungs are clear. Remote right rib fractures. No acute osseous abnormality. IMPRESSION: No acute cardiac or pulmonary abnormality. Electronically Signed   By: Merilyn Baba MD   On: 01/19/2021 09:37   CT Head Wo Contrast  Result Date: 01/19/2021 CLINICAL DATA:  Head trauma, abnormal mental status (Age 84-64y). Facial trauma. Additional history provided: Fall this morning, abrasion to nose, slurred speech. EXAM: CT HEAD WITHOUT CONTRAST CT MAXILLOFACIAL WITHOUT CONTRAST TECHNIQUE: Multidetector CT imaging of the head and maxillofacial structures were performed using the standard protocol without intravenous contrast. Multiplanar CT image reconstructions of  the maxillofacial structures were also generated. COMPARISON:  Head CT 01/11/2021. FINDINGS: CT HEAD FINDINGS Brain: Mild generalized cerebral atrophy. Mild patchy and ill-defined hypoattenuation within the cerebral white matter, nonspecific but compatible with chronic small vessel ischemic disease. Redemonstrated left middle cranial fossa arachnoid cyst, anterior to the left temporal lobe, measuring 4.4 x 3.3 cm in transaxial dimensions. There is no acute intracranial hemorrhage. No demarcated cortical infarct. No extra-axial fluid collection. No evidence of an intracranial mass. No midline shift. Vascular: No hyperdense vessel.  Atherosclerotic calcifications. Skull: Normal. Negative for fracture or focal lesion. CT MAXILLOFACIAL FINDINGS Osseous: No acute maxillofacial fracture is identified. Orbits: No acute finding within the orbits. The globes are normal in size and contour. The extraocular muscles and optic nerve sheath complexes are symmetric and unremarkable. Sinuses: Large left maxillary sinus mucous retention cyst. Small mucous retention cyst within a posterior left ethmoid air cell. Mucosal thickening within an anterior right ethmoid air cell. Soft tissues: No significant maxillofacial soft tissue swelling is appreciable by CT. IMPRESSION: CT head: 1. No evidence of acute intracranial abnormality. 2. Mild generalized cerebral atrophy and cerebral white matter chronic small vessel ischemic disease. 3. Redemonstrated left middle cranial fossa arachnoid cyst. CT maxillofacial: 1. No evidence of acute maxillofacial fracture. 2. Paranasal sinus disease as described. Electronically Signed   By: Kellie Simmering DO   On: 01/19/2021 10:12   CT CHEST ABDOMEN PELVIS W CONTRAST  Result Date: 01/19/2021 CLINICAL DATA:  60 year old male status post fall this morning. Acute L1 compression fracture secondary to a fall on 01/09/21-01/11/21. EXAM: CT CHEST, ABDOMEN, AND PELVIS WITH CONTRAST TECHNIQUE: Multidetector CT  imaging of the chest, abdomen and pelvis was performed following the standard protocol during bolus administration of intravenous contrast. CONTRAST:  55m OMNIPAQUE IOHEXOL 300 MG/ML  SOLN COMPARISON:  Lumbar spine CT 01/11/2021. FINDINGS: CT CHEST FINDINGS Cardiovascular: Normal thoracic aorta. Calcified coronary artery atherosclerosis on series 2, image 33. No cardiomegaly or pericardial effusion. Other central mediastinal vascular structures appear intact. Mediastinum/Nodes: Negative, no mediastinal hematoma or lymphadenopathy. Lungs/Pleura: Lung volumes appear normal. Major airways are patent. Minor calcified scarring in both lung apices. Minimal dependent atelectasis or scarring in both lungs, which otherwise appear clear. Musculoskeletal: There is moderate to severe anterior wedge compression fracture of the T6 vertebral body. Minimal retropulsion of the posteroinferior endplate. T6 pedicles and posterior elements appear intact. This fracture is age indeterminate, although absence of paraspinal soft tissue swelling and lucent fracture line suggest this is chronic. Other thoracic vertebrae are  intact. Intact sternum. Visible shoulder osseous structures are intact. Chronic right lateral (6 through 8) rib fractures. Chronic right posterior 10th and 11th rib fractures. Chronic left 9th through 11th rib fractures. No acute rib fracture identified. CT ABDOMEN PELVIS FINDINGS Hepatobiliary: Hepatic steatosis. Otherwise negative liver and gallbladder. Pancreas: Negative. Spleen: Negative; subtle low-density along some of the splenic capsule (series 5, image 36 and series 2, image 56) could be trace subcapsular hematoma. This appears unchanged on the delayed images. But otherwise the spleen appears intact. No perisplenic fluid. Incidental splenule (normal variant). Adrenals/Urinary Tract: Normal adrenal glands. Nonobstructed kidneys appear intact. Bilateral nephrolithiasis measuring 5-6 mm. Symmetric renal enhancement  and contrast excretion. Normal ureters. Incidental pelvic phleboliths. Unremarkable bladder. Stomach/Bowel: Negative large bowel aside from sigmoid diverticulosis. Normal appendix on series 2, image 100. Negative terminal ileum. No dilated small bowel. Negative stomach and duodenum. No free air, free fluid. Vascular/Lymphatic: Mild Calcified aortic atherosclerosis. Major arterial structures appear patent and intact. Portal venous system is patent. No lymphadenopathy. Reproductive: Negative. Other: No pelvic free fluid. Musculoskeletal: Comminuted central L1 vertebral body fracture with progressed and now severe central loss of vertebral body height (series 6, image 64). Minimal retropulsion. L1 pedicles and posterior elements remain intact. Lucent fracture planes remain visible on series 4, image 158. But there is no significant paraspinal soft tissue swelling. Other lumbar levels appear stable and intact. Sacrum and SI joints appear stable and intact. Pelvis and proximal femurs appear intact. IMPRESSION: 1. Progressed L1 compression fracture since 01/11/2021 now with severe central loss of vertebral body height. Minimal retropulsion, and no other complicating features. 2. Age indeterminate but probably chronic wedge compression fracture of T6. Multiple bilateral chronic rib fractures. 3. Possible trace Subcapsular Hematoma of the Spleen, which otherwise appears intact. No hemoperitoneum. 4. No other acute traumatic injury identified in the chest, abdomen, or pelvis. 5. Hepatic steatosis. Bilateral nephrolithiasis. Sigmoid diverticulosis. Calcified coronary artery atherosclerosis. Electronically Signed   By: Genevie Ann M.D.   On: 01/19/2021 11:38   CT Maxillofacial WO CM  Result Date: 01/19/2021 CLINICAL DATA:  Head trauma, abnormal mental status (Age 68-64y). Facial trauma. Additional history provided: Fall this morning, abrasion to nose, slurred speech. EXAM: CT HEAD WITHOUT CONTRAST CT MAXILLOFACIAL WITHOUT  CONTRAST TECHNIQUE: Multidetector CT imaging of the head and maxillofacial structures were performed using the standard protocol without intravenous contrast. Multiplanar CT image reconstructions of the maxillofacial structures were also generated. COMPARISON:  Head CT 01/11/2021. FINDINGS: CT HEAD FINDINGS Brain: Mild generalized cerebral atrophy. Mild patchy and ill-defined hypoattenuation within the cerebral white matter, nonspecific but compatible with chronic small vessel ischemic disease. Redemonstrated left middle cranial fossa arachnoid cyst, anterior to the left temporal lobe, measuring 4.4 x 3.3 cm in transaxial dimensions. There is no acute intracranial hemorrhage. No demarcated cortical infarct. No extra-axial fluid collection. No evidence of an intracranial mass. No midline shift. Vascular: No hyperdense vessel.  Atherosclerotic calcifications. Skull: Normal. Negative for fracture or focal lesion. CT MAXILLOFACIAL FINDINGS Osseous: No acute maxillofacial fracture is identified. Orbits: No acute finding within the orbits. The globes are normal in size and contour. The extraocular muscles and optic nerve sheath complexes are symmetric and unremarkable. Sinuses: Large left maxillary sinus mucous retention cyst. Small mucous retention cyst within a posterior left ethmoid air cell. Mucosal thickening within an anterior right ethmoid air cell. Soft tissues: No significant maxillofacial soft tissue swelling is appreciable by CT. IMPRESSION: CT head: 1. No evidence of acute intracranial abnormality. 2. Mild generalized cerebral atrophy  and cerebral white matter chronic small vessel ischemic disease. 3. Redemonstrated left middle cranial fossa arachnoid cyst. CT maxillofacial: 1. No evidence of acute maxillofacial fracture. 2. Paranasal sinus disease as described. Electronically Signed   By: Kellie Simmering DO   On: 01/19/2021 10:12    EKG: Independently reviewed.  QT prolongation  Assessment/Plan Active  Problems:   Essential hypertension   Increased ammonia level   Thrombocytopenia (HCC)   Macrocytic anemia   GI bleed   ETOH abuse   QT prolongation  Acute GI bleed -Transfuse for hemoglobin<7 - 8/9 transfuse 2 units PRBC -8/9 Protonix drip - 8/9 consulted [facility] PA Tye Savoy -Echocardiogram pending  Thrombocytopenia -Most likely secondary to alcohol abuse - Given patient's current active bleeding monitor closely.  May have to replace platelets   EtOH abuse -CIWA protocol - UDS negative EtOH, positive benzodiazepine most likely secondary to administration in ED  Essential HTN -Currently controlled without medication - Would hold any BP lowering medication given patient's GI bleed. - Monitor closely  QT prolongation - Hold all QT prolongation medication  Increased ammonia level  -Ammonia level= 23 WNL -NOTE liver enzymes WNL continue to monitor  Hypomagnesmia -Magnesium goal> 2 - Magnesium IV 2 g      Code Status: Full (DVT Prophylaxis: SCD Family Communication:   Status is: Inpatient    Dispo: The patient is from: Home              Anticipated d/c is to: Home              Anticipated d/c date is: 3 days              Patient currently is not medically stable to d/c.     Data Reviewed: Care during the described time interval was provided by me .  I have reviewed this patient's available data, including medical history, events of note, physical examination, and all test results as part of my evaluation.   The patient is critically ill with multiple organ systems failure and requires high complexity decision making for assessment and support, frequent evaluation and titration of therapies, application of advanced monitoring technologies and extensive interpretation of multiple databases. Critical Care Time devoted to patient care services described in this note  Time spent: 70 minutes    Anyae Griffith, [facility]

## 2021-01-20 ENCOUNTER — Encounter (HOSPITAL_COMMUNITY): Payer: Self-pay | Admitting: Certified Registered Nurse Anesthetist

## 2021-01-20 ENCOUNTER — Encounter (HOSPITAL_COMMUNITY)
Admission: EM | Disposition: A | Discharge status: Home / Self Care | Payer: Self-pay | Source: Home / Self Care | Attending: Internal Medicine

## 2021-01-20 DIAGNOSIS — F1010 Alcohol abuse, uncomplicated: Secondary | ICD-10-CM

## 2021-01-20 DIAGNOSIS — D539 Nutritional anemia, unspecified: Secondary | ICD-10-CM

## 2021-01-20 DIAGNOSIS — F10230 Alcohol dependence with withdrawal, uncomplicated: Secondary | ICD-10-CM

## 2021-01-20 DIAGNOSIS — F10939 Alcohol use, unspecified with withdrawal, unspecified: Secondary | ICD-10-CM

## 2021-01-20 DIAGNOSIS — F10239 Alcohol dependence with withdrawal, unspecified: Secondary | ICD-10-CM

## 2021-01-20 DIAGNOSIS — D696 Thrombocytopenia, unspecified: Secondary | ICD-10-CM

## 2021-01-20 DIAGNOSIS — D62 Acute posthemorrhagic anemia: Secondary | ICD-10-CM

## 2021-01-20 LAB — COMPREHENSIVE METABOLIC PANEL
ALT: 35 U/L (ref 0–44)
ALT: 42 U/L (ref 0–44)
AST: 48 U/L — ABNORMAL HIGH (ref 15–41)
AST: 72 U/L — ABNORMAL HIGH (ref 15–41)
Albumin: 3 g/dL — ABNORMAL LOW (ref 3.5–5.0)
Albumin: 3.2 g/dL — ABNORMAL LOW (ref 3.5–5.0)
Alkaline Phosphatase: 53 U/L (ref 38–126)
Alkaline Phosphatase: 60 U/L (ref 38–126)
Anion gap: 6 (ref 5–15)
Anion gap: 8 (ref 5–15)
BUN: 24 mg/dL — ABNORMAL HIGH (ref 8–23)
BUN: 22 mg/dL (ref 8–23)
CO2: 22 mmol/L (ref 22–32)
CO2: 21 mmol/L — ABNORMAL LOW (ref 22–32)
Calcium: 8 mg/dL — ABNORMAL LOW (ref 8.9–10.3)
Calcium: 8.2 mg/dL — ABNORMAL LOW (ref 8.9–10.3)
Chloride: 107 mmol/L (ref 98–111)
Chloride: 109 mmol/L (ref 98–111)
Creatinine, Ser: 0.91 mg/dL (ref 0.61–1.24)
Creatinine, Ser: 0.98 mg/dL (ref 0.61–1.24)
GFR, Estimated: >60 mL/min (ref >60)
GFR, Estimated: >60 mL/min (ref >60)
Glucose, Bld: 103 mg/dL — ABNORMAL HIGH (ref 70–99)
Glucose, Bld: 97 mg/dL (ref 70–99)
Potassium: 3.2 mmol/L — ABNORMAL LOW (ref 3.5–5.1)
Potassium: 3.8 mmol/L (ref 3.5–5.1)
Sodium: 135 mmol/L (ref 135–145)
Sodium: 138 mmol/L (ref 135–145)
Total Bilirubin: 1.3 mg/dL — ABNORMAL HIGH (ref 0.3–1.2)
Total Bilirubin: 1.3 mg/dL — ABNORMAL HIGH (ref 0.3–1.2)
Total Protein: 5.5 g/dL — ABNORMAL LOW (ref 6.5–8.1)
Total Protein: 6 g/dL — ABNORMAL LOW (ref 6.5–8.1)

## 2021-01-20 LAB — CBC WITH DIFFERENTIAL/PLATELET
Abs Immature Granulocytes: 0.07 10*3/uL (ref 0.00–0.07)
Basophils Absolute: 0 10*3/uL (ref 0.0–0.1)
Basophils Relative: 1 %
Eosinophils Absolute: 0.1 10*3/uL (ref 0.0–0.5)
Eosinophils Relative: 3 %
HCT: 23.6 % — ABNORMAL LOW (ref 39.0–52.0)
Hemoglobin: 8 g/dL — ABNORMAL LOW (ref 13.0–17.0)
Immature Granulocytes: 2 %
Lymphocytes Relative: 17 %
Lymphs Abs: 0.7 10*3/uL (ref 0.7–4.0)
MCH: 34.8 pg — ABNORMAL HIGH (ref 26.0–34.0)
MCHC: 33.9 g/dL (ref 30.0–36.0)
MCV: 102.6 fL — ABNORMAL HIGH (ref 80.0–100.0)
Monocytes Absolute: 0.4 10*3/uL (ref 0.1–1.0)
Monocytes Relative: 10 %
Neutro Abs: 2.7 10*3/uL (ref 1.7–7.7)
Neutrophils Relative %: 67 %
Platelets: 69 10*3/uL — ABNORMAL LOW (ref 150–400)
RBC: 2.3 MIL/uL — ABNORMAL LOW (ref 4.22–5.81)
RDW: 14.1 % (ref 11.5–15.5)
WBC: 3.9 10*3/uL — ABNORMAL LOW (ref 4.0–10.5)
nRBC: 0.8 % — ABNORMAL HIGH (ref 0.0–0.2)

## 2021-01-20 LAB — MAGNESIUM
Magnesium: 1.7 mg/dL (ref 1.7–2.4)
Magnesium: 2 mg/dL (ref 1.7–2.4)

## 2021-01-20 LAB — ECHOCARDIOGRAM COMPLETE
AR max vel: 3.15 cm2
AV Area VTI: 3.03 cm2
AV Area mean vel: 3.05 cm2
AV Mean grad: 2 mmHg
AV Peak grad: 4.6 mmHg
Ao pk vel: 1.07 m/s
Area-P 1/2: 3.89 cm2
Height: 72 in
S' Lateral: 3.1 cm
Weight: 2171.2 oz

## 2021-01-20 LAB — TYPE AND SCREEN
ABO/RH(D): O POS
Antibody Screen: NEGATIVE
Unit division: 0
Unit division: 0

## 2021-01-20 LAB — CBC
HCT: 25.8 % — ABNORMAL LOW (ref 39.0–52.0)
HCT: 23.2 % — ABNORMAL LOW (ref 39.0–52.0)
Hemoglobin: 8.9 g/dL — ABNORMAL LOW (ref 13.0–17.0)
Hemoglobin: 8 g/dL — ABNORMAL LOW (ref 13.0–17.0)
MCH: 35.6 pg — ABNORMAL HIGH (ref 26.0–34.0)
MCH: 35.4 pg — ABNORMAL HIGH (ref 26.0–34.0)
MCHC: 34.5 g/dL (ref 30.0–36.0)
MCHC: 34.5 g/dL (ref 30.0–36.0)
MCV: 103.2 fL — ABNORMAL HIGH (ref 80.0–100.0)
MCV: 102.7 fL — ABNORMAL HIGH (ref 80.0–100.0)
Platelets: 84 10*3/uL — ABNORMAL LOW (ref 150–400)
Platelets: 73 10*3/uL — ABNORMAL LOW (ref 150–400)
RBC: 2.5 MIL/uL — ABNORMAL LOW (ref 4.22–5.81)
RBC: 2.26 MIL/uL — ABNORMAL LOW (ref 4.22–5.81)
RDW: 14.5 % (ref 11.5–15.5)
RDW: 14 % (ref 11.5–15.5)
WBC: 4.6 10*3/uL (ref 4.0–10.5)
WBC: 3.6 10*3/uL — ABNORMAL LOW (ref 4.0–10.5)
nRBC: 1.1 % — ABNORMAL HIGH (ref 0.0–0.2)
nRBC: 0.6 % — ABNORMAL HIGH (ref 0.0–0.2)

## 2021-01-20 LAB — BPAM RBC
Blood Product Expiration Date: 202209132359
Blood Product Expiration Date: 202209132359
ISSUE DATE / TIME: 202208092029
ISSUE DATE / TIME: 202208092323
Unit Type and Rh: 5100
Unit Type and Rh: 5100

## 2021-01-20 LAB — PHOSPHORUS
Phosphorus: 2.4 mg/dL — ABNORMAL LOW (ref 2.5–4.6)
Phosphorus: 2.4 mg/dL — ABNORMAL LOW (ref 2.5–4.6)

## 2021-01-20 SURGERY — CANCELLED PROCEDURE

## 2021-01-20 MED ORDER — MAGNESIUM SULFATE IN D5W 1-5 GM/100ML-% IV SOLN
1.0000 g | Freq: Once | INTRAVENOUS | Status: AC
  Administered 2021-01-20: 1 g via INTRAVENOUS
  Filled 2021-01-20: qty 100

## 2021-01-20 MED ORDER — LORAZEPAM 2 MG/ML IJ SOLN: 0.0000 mg | INTRAMUSCULAR | Status: DC

## 2021-01-20 MED ORDER — LORAZEPAM 1 MG PO TABS: 1.0000 mg | ORAL_TABLET | ORAL | Status: DC | PRN

## 2021-01-20 MED ORDER — POTASSIUM PHOSPHATES 15 MMOLE/5ML IV SOLN
20.0000 mmol | Freq: Once | INTRAVENOUS | Status: AC
  Administered 2021-01-20: 20 mmol via INTRAVENOUS
  Filled 2021-01-20: qty 6.67

## 2021-01-20 MED ORDER — LORAZEPAM 2 MG/ML IJ SOLN
0.0000 mg | Freq: Three times a day (TID) | INTRAMUSCULAR | Status: DC
Start: 2021-01-22 — End: 2021-01-20

## 2021-01-20 MED ORDER — ADULT MULTIVITAMIN W/MINERALS CH
1.0000 | ORAL_TABLET | Freq: Every day | ORAL | Status: DC
  Administered 2021-01-23 – 2021-01-25 (×3): 1 via ORAL
  Filled 2021-01-20 (×3): qty 1

## 2021-01-20 MED ORDER — SODIUM CHLORIDE 0.9 % IV SOLN
50.0000 ug/h | INTRAVENOUS | Status: DC
  Administered 2021-01-20 – 2021-01-22 (×4): 50 ug/h via INTRAVENOUS
  Filled 2021-01-20 (×7): qty 1

## 2021-01-20 MED ORDER — POTASSIUM CHLORIDE 10 MEQ/100ML IV SOLN
10.0000 meq | INTRAVENOUS | Status: AC
  Administered 2021-01-20 (×3): 10 meq via INTRAVENOUS
  Filled 2021-01-20 (×3): qty 100

## 2021-01-20 MED ORDER — SODIUM CHLORIDE 0.9 % IV SOLN
1.0000 g | Freq: Once | INTRAVENOUS | Status: AC
  Administered 2021-01-20: 1 g via INTRAVENOUS
  Filled 2021-01-20: qty 10

## 2021-01-20 MED ORDER — LORAZEPAM 2 MG/ML IJ SOLN: 1.0000 mg | INTRAMUSCULAR | Status: DC | PRN

## 2021-01-20 MED ORDER — VITAMIN K1 10 MG/ML IJ SOLN
5.0000 mg | Freq: Once | INTRAVENOUS | Status: AC
  Administered 2021-01-20: 5 mg via INTRAVENOUS
  Filled 2021-01-20: qty 0.5

## 2021-01-20 SURGICAL SUPPLY — 15 items

## 2021-01-20 NOTE — Progress Notes (Signed)
Pt is confused, unable to recognize his daughter and wife, hallucinating, trying to get OOB, and pulling cords. Wife stated "he is talking to his coworkers". Pt has developed alcohol withdrawal symptoms since morning and is being given ativan as per CIWA score. VSS.

## 2021-01-20 NOTE — Consult Note (Addendum)
Attending physician's note   I have taken an interval history, reviewed the chart and examined the patient. I agree with the Advanced Practitioner's note, impression, and recommendations as outlined.   60 year old male with history of HTN, EtOH abuse, thrombocytopenia, chronic anemia, presented to the ER last evening after fall at home.  GI service was consulted for anemia and reported melena by ER staff.  Patient himself is otherwise unaware of overt bleeding on initial intake evaluations.  Initial evaluation notable for the following:  - H/H 8.3/23.5.  Repeat 6.7/19.5 and transfused 2 units --> 7.1/20.3 - BUN/creatinine 48/1 - AST/ALT 45/38, T bili 1.7, albumin 4.4 - PLT 129 - Lipase, UDS negative - FOBT positive - CT: Progressive L1 compression fracture, age indeterminate T6 fracture, possible trace subcapsular splenic hematoma, hepatic steatosis, diverticulosis without diverticulitis  Comparison labs from 7/31/722: - H/H 14/41 with MCV/RDW 105/12 - PLT 66 - BUN/creatinine 10/0.8 - AST/ALT/T bili 108/62/1.3, albumin 4.1  1) Acute blood loss anemia 2) FOBT positive stool - Continue serial CBC checks with additional blood products needed per protocol - Continue IV PPI - Giving IV vitamin K - Plan for EGD when clinically stable (currently in DTs) - As below, while he does not have known history of cirrhosis, plan to start on octreotide and continued ABX as already prescribed (on Rocephin) - Stopped NSAIDs (had been taking Aleve for back pain)  3) EtOH abuse 4) Alcohol withdrawal symptoms - CIWA protocol - Per d/w Anesthesia service, to elevated of risk to proceed with EGD today.  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 male, who presented to the emergency room at [facility] yesterday after he fell at home in his bedroom face forward.  He said he had been feeling weak.  Patient has history of chronic EtOH use at least 1/5/day, no prior GI evaluation.  Imaging in the emergency room yesterday with CT of the chest abdomen and pelvis showed calcified coronary arteries, age indeterminant T6 compression fracture favored chronic, chronic rib fractures and progression of an L1 compression fracture since 01/11/2021. Hepatic steatosis, no overt cirrhosis, subtle low-density along the splenic capsule question trace splenic hematoma no perisplenic fluid Sigmoid diverticulosis. Labs yesterday WBC 9.2 hemoglobin 8.3/MCV 103/platelets 129 Lactate 3.1 COVID-19 negative BUN 48/creatinine 1.0 T bili 1.7/AST 45/ALT 38. Drug screen negative EtOH negative Repeat hemoglobin down to 6.7 he has been transfused 2 units and hemoglobin is 8 this morning platelets 69.  Patient had been unaware of any rectal bleeding or melena at home but was noted to have melenic stool on exam per ER MD yesterday  Patient has been hemodynamically stable since admission, no hematemesis or melena reported by nursing.  He is in EtOH withdrawal this morning, confused somewhat agitated, picking at his lines and repeatedly trying to get out of bed.  His daughter is at bedside. She is not aware that he has had any recent complaints of abdominal discomfort or nausea vomiting.  He  has been taking Aleve at home for back pain but she is not certain how often he is taking this.  She confirms no prior GI evaluation.  Patient has gone through alcohol withdrawal programs a couple of  times in the past the most recent was a 7-day program and January which she says was very unhelpful.   Past Medical History:  Diagnosis Date  . Allergy   . Distal radius fracture, left   . Hypertension     Past Surgical History:  Procedure Laterality Date  . I & D EXTREMITY Right 01/10/2020   Procedure: ORIF RIGHT WRIST;  Surgeon: Roseanne Kaufman, MD;  Location: [place];  Service: Orthopedics;  Laterality: Right;  . OPEN REDUCTION INTERNAL FIXATION (ORIF) DISTAL RADIAL FRACTURE Left 01/28/2015   Procedure: OPEN REDUCTION INTERNAL FIXATION (ORIF) LEFT DISTAL RADIAL FRACTURE;  Surgeon: Leandrew Koyanagi, MD;  Location: [place];  Service: Orthopedics;  Laterality: Left;  . TONSILLECTOMY      Prior to Admission medications   Medication Sig Start Date End Date Taking? Authorizing Provider  Ascorbic Acid (VITAMIN C) 1000 MG tablet Take 1,000 mg by mouth daily.   Yes [provider]  thiamine (VITAMIN B-1) 100 MG tablet Take 1 tablet (100 mg total) by mouth daily. 02/24/20  Yes Samuel Bouche, NP  vitamin B-12 (CYANOCOBALAMIN) 1000 MCG tablet Take 1 tablet (1,000 mcg total) by mouth daily. 02/24/20  Yes Samuel Bouche, NP  ibuprofen (ADVIL) 200 MG tablet Take 400 mg by mouth See admin instructions. Take two tablets (400 mg) by mouth in the morning on work days    [provider]    Current Facility-Administered Medications  Medication Dose Route Frequency Provider Last Rate Last Admin  . 0.9 %  sodium chloride infusion (Manually program via Guardrails IV Fluids)   Intravenous Once Allie Bossier, MD      . 0.9 %  sodium chloride infusion  10 mL/hr Intravenous Once Arnaldo Natal, MD      . 0.9 %  sodium chloride infusion   Intravenous Continuous Elgergawy, Silver Huguenin, MD 75 mL/hr at 01/20/21 0816 Rate Change at 01/20/21 0816  . acetaminophen (TYLENOL) tablet 650 mg  650 mg Oral Q6H PRN Allie Bossier, MD       Or  . acetaminophen (TYLENOL) suppository 650 mg  650 mg  Rectal Q6H PRN Allie Bossier, MD      . folic acid (FOLVITE) tablet 1 mg  1 mg Oral Daily Allie Bossier, MD   1 mg at 01/20/21 0837  . LORazepam (ATIVAN) injection 0-4 mg  0-4 mg Intravenous Q4H Allie Bossier, MD   2 mg at 01/20/21 1016   Followed by  . [START ON 01/21/2021] LORazepam (ATIVAN) injection 0-4 mg  0-4 mg Intravenous Q8H Allie Bossier, MD      . LORazepam (ATIVAN) tablet 1-4 mg  1-4 mg Oral Q1H PRN Allie Bossier, MD       Or  . LORazepam (ATIVAN) injection 1-4 mg  1-4 mg Intravenous Q1H PRN Allie Bossier, MD   1 mg at 01/20/21 0839  . multivitamin with minerals tablet 1 tablet  1 tablet Oral Daily Allie Bossier, MD   1 tablet at 01/20/21 [phone]  . oxyCODONE (Oxy IR/ROXICODONE) immediate release tablet 5 mg  5 mg Oral Q4H PRN Allie Bossier, MD   5 mg at 01/20/21 0513  . [START ON 01/23/2021] pantoprazole (PROTONIX) injection 40 mg  40 mg Intravenous Q12H Allie Bossier, MD      . pantoprozole (PROTONIX) 80 mg /NS 100 mL infusion  8 mg/hr Intravenous Continuous Allie Bossier, MD 10 mL/hr at 01/20/21 0247 8 mg/hr at 01/20/21 0247  . potassium chloride 10 mEq in 100 mL IVPB  10 mEq Intravenous Q1 Hr x 3 Elgergawy, Silver Huguenin, MD 100 mL/hr at 01/20/21 1009 10 mEq at 01/20/21 1009  . potassium PHOSPHATE 20 mmol in dextrose 5 % 500 mL infusion  20 mmol Intravenous Once Elgergawy, Silver Huguenin, MD      . thiamine tablet 100 mg  100 mg Oral Daily Allie Bossier, MD   100 mg at 01/20/21 9629   Or  . thiamine (B-1) injection 100 mg  100 mg Intravenous Daily Allie Bossier, MD        Allergies as of 01/19/2021  . (No Known Allergies)    Family History  Problem Relation Age of Onset  . Heart disease Mother   . Hyperlipidemia Mother   . Skin cancer Mother   . Cancer Father   . Multiple myeloma Sister   . Throat cancer Sister     Social History   Socioeconomic History  . Marital status: Married    Spouse name: Margreta Journey  . Number of children: 5  . Years of education:  66  . Highest education level: Not on file  Occupational History  . Occupation: Government social research officer  Tobacco Use  . Smoking status: Every Day    Packs/day: 1.00    Years: 10.00    Pack years: 10.00    Types: Cigarettes  . Smokeless tobacco: Current    Types: Chew  Vaping Use  . Vaping Use: Never used  Substance and Sexual Activity  . Alcohol use: Yes    Alcohol/week: 16.0 - 20.0 standard drinks    Types: 16 - 20 Standard drinks or equivalent per week    Comment: 1 gallon/week  . Drug use: Never  . Sexual activity: Yes    Partners: Female  Other Topics Concern  . Not on file  Social History Narrative  . Not on file   Social Determinants of Health   Financial Resource Strain: Not on file  Food Insecurity: Not on file  Transportation Needs: Not on file  Physical Activity: Not on file  Stress: Not on file  Social Connections: Not on file  Intimate Partner Violence: Not on file    Review of Systems: Pertinent positive and negative review of systems were noted in the above HPI section.  All other review of systems was otherwise negative.   Physical Exam: Vital signs in last 24 hours: Temp:  [98.3 F (36.8 C)-99.1 F (37.3 C)] 98.6 F (37 C) (08/10 0807) Pulse Rate:  [69-96] 70 (08/10 0807) Resp:  [16-20] 16 (08/10 0807) BP: (117-144)/(72-97) 125/86 (08/10 0900) SpO2:  [96 %-100 %] 100 % (08/10 0807) Weight:  [61.6 kg] 61.6 kg (08/09 1515) Last BM Date: 01/19/21 General:   Alert, well-developed, confused somewhat agitated older white male, unable to answer any questions appropriately.  His daughter is at bedside. Head:  Normocephalic and atraumatic. Eyes:  Sclera clear, no icterus.   Conjunctiva pink. Ears:  Normal auditory acuity. Nose:  No deformity, discharge,  or lesions. Mouth:  No deformity or lesions.   Neck:  Supple; no masses or thyromegaly. Lungs:  Clear throughout to auscultation.   No wheezes, crackles, or rhonchi.  Heart:  Regular rate and rhythm; no  murmurs, clicks, rubs,  or gallops. Abdomen:  Soft,nontender, BS active,nonpalp mass or hsm.   Rectal: Not done, documented heme positive yesterday with melena on exam per ER MD Msk:  Symmetrical without gross deformities. . Pulses:  Normal pulses noted. Extremities:  Without clubbing or edema. Neurologic:  Alert, confused to place and time, agitated and picking at his lines Skin:  Intact without significant lesions or rashes.. Psych:  Alert and cooperative. Normal mood and affect.  Intake/Output from previous day: 08/09 0701 - 08/10 0700 In: 2334.6 [I.V.:279.6; Blood:1005; IV Piggyback:1050] Out: 400 [Urine:400] Intake/Output this shift: Total I/O In: 2311.6 [P.O.:30; I.V.:2281.6] Out: 150 [Urine:150]  Lab Results: Recent Labs    01/19/21 1323 01/19/21 1756 01/20/21 0545  WBC 7.6 7.8 3.9*  HGB 6.7* 7.1* 8.0*  HCT 19.6* 20.3* 23.6*  PLT 100* DCLMP 69*   BMET Recent Labs    01/19/21 0920 01/19/21 1756 01/20/21 0545  NA 138 135 135  K 3.6 3.5 3.2*  CL 100 105 107  CO2 _0 GLUCOSE 182* 125* 103*  BUN 48* 32* 24*  CREATININE 1.00 0.90 0.91  CALCIUM 9.4 8.6* 8.0*   LFT Recent Labs    01/20/21 0545  PROT 5.5*  ALBUMIN 3.0*  AST 48*  ALT 35  ALKPHOS 53  BILITOT 1.3*   PT/INR Recent Labs    01/19/21 0920  LABPROT 16.8*  INR 1.4*   Hepatitis Panel No results for input(s): HEPBSAG, HCVAB, HEPAIGM, HEPBIGM in the last 72 hours.    IMPRESSION:  #73 60 year old white male alcoholic who presented to the ER yesterday after a fall at home.  Found to have melena on exam, and macrocytic anemia. Patient unable to offer any history as to being aware of melena or bleeding. He has been taking Aleve at home for back pain Hemodynamically stable since admission, no active bleeding.  By imaging he has hepatic steatosis no definite cirrhosis  Rule out gastropathy, peptic ulcer disease, portal gastropathy, esophagitis, AVMs  #2 anemia-acute secondary to above.   Patient noted hemoglobin of 14 on 01/10/2021 # 3 probable small subcapsular splenic hematoma #4 progressive L1 compression fracture #5 T6 compression fracture probably chronic also with multiple rib fractures #6 diverticulosis by CT  #7 EtOH withdrawal-has been started on CIWA, being escalated this morning  Plan; continue PPI infusion Serial hemoglobins and transfuse for hemoglobin less than 7 Will give vitamin K today for mild coagulopathy Keep n.p.o. Patient will be scheduled for EGD with Dr. Bryan Lemma this afternoon.  Procedure was discussed in detail with the patient's daughter who is at bedside and she will sign consent. GI will follow with you      Amy Esterwood  PA-C8/03/2021, 10:45 AM

## 2021-01-20 NOTE — Progress Notes (Signed)
   01/20/21 1639  Assess: MEWS Score  Temp 98.1 F (36.7 C)  BP 99/67  Pulse Rate (!) 54  ECG Heart Rate (!) 56  Resp 13  Level of Consciousness Responds to Voice  SpO2 98 %  O2 Device Room Air  Patient Activity (if Appropriate) In bed  Assess: MEWS Score  MEWS Temp 0  MEWS Systolic 1  MEWS Pulse 0  MEWS RR 1  MEWS LOC 1  MEWS Score 3  MEWS Score Color Yellow  Assess: if the MEWS score is Yellow or Red  Were vital signs taken at a resting state? Yes  Focused Assessment No change from prior assessment  Early Detection of Sepsis Score *See Row Information* Low  MEWS guidelines implemented *See Row Information* Yes  Take Vital Signs  Increase Vital Sign Frequency  Yellow: Q 2hr X 2 then Q 4hr X 2, if remains yellow, continue Q 4hrs  Escalate  MEWS: Escalate Yellow: discuss with charge nurse/RN and consider discussing with provider and RRT  Notify: Charge Nurse/RN  Name of Charge Nurse/RN Notified Yoko, RN  Date Charge Nurse/RN Notified 01/20/21  Time Charge Nurse/RN Notified 1700  Notify: Provider  Provider Name/Title Mliss Fritz, MD  Date Provider Notified 01/20/21  Time Provider Notified [phone]  Document  Patient Outcome Stabilized after interventions  Progress note created (see row info) Yes

## 2021-01-20 NOTE — Progress Notes (Signed)
PROGRESS NOTE    Richard Andrews  WUJ:[id number] DOB: 02-19-1961 DOA: 01/19/2021 PCP: [name], NP  Chief Complaint  Patient presents with   Fall    Brief Narrative:   60 yo WM PMHx EtOH abuse, Increased ammonia level, thrombocytopenia, macrocytic anemia   Patient was admitted with a fall, recent ED visit with diagnosis of L1 compression fracture, work-up in ED significant for anemia, melena, Hemoccult positive stools, prior PRBC transfusion, as well patient developed alcohol withdrawals.    Assessment & Plan:   Active Problems:   Essential hypertension   Increased ammonia level   Thrombocytopenia (HCC)   Macrocytic anemia   GI bleed   ETOH abuse   QT prolongation  Acute blood loss anemia due to GI bleed -Patient presents with acute blood loss anemia, due to GI bleed, unclear upper versus lower, but he does present with melena, hemoglobin was 14.410 days ago, currently 6.7 on admission, transfused 2 units PRBC, hemoglobin remained stable. -Keep n.p.o. as plan for endoscopy this afternoon -After H&H closely and transfuse for hemoglobin < 7 . -Continue with the Protonix drip  -Received 5 mg of IV vitamin K per GI due to coagulopathy  Thrombocytopenia -Most likely secondary to alcohol abuse -Continue to monitor, no indication for transfusion  EtOH abuse with withdrawals -Patient restless, with tremors, and hallucination today, will change his CIWA protocol to stepdown protocol, and upgrade his status to stepdown. -Continue with thiamine and folic acid.  Essential HTN -Currently controlled without medication - Would hold any BP lowering medication given patient's GI bleed. - Monitor closely   QT prolongation - Hold all QT prolongation medication   Hypomagnesmia -Magnesium goal> 2 - Magnesium IV 2 g    DVT prophylaxis: SCD Code Status: Full Family Communication: none at bedside Disposition:   Status is: Inpatient  Remains inpatient appropriate because:IV  treatments appropriate due to intensity of illness or inability to take PO  Dispo: The patient is from: Home              Anticipated d/c is to: Home              Patient currently is not medically stable to d/c.   Difficult to place patient No       Consultants:  GI   Subjective:  He himself denies any complaints, he appears to be mildly confused, no significant events earlier during the day, but later in the day he appears to be more restless and confused, unable to recognize family members.  Objective: Vitals:   01/20/21 0900 01/20/21 1020 01/20/21 1140 01/20/21 1218  BP: 125/86 119/81 127/82 119/80  Pulse:  71 70 88  Resp:    19  Temp:    98.2 F (36.8 C)  TempSrc:    Oral  SpO2:    99%  Weight:      Height:        Intake/Output Summary (Last 24 hours) at 01/20/2021 1302 Last data filed at 01/20/2021 0855 Gross per 24 hour  Intake 3596.12 ml  Output 550 ml  Net 3046.12 ml   Filed Weights   01/19/21 0832 01/19/21 1515  Weight: 68.2 kg 61.6 kg    Examination:   Awake Alert,, restless, with tremors  Symmetrical Chest wall movement, Good air movement bilaterally, CTAB RRR,No Gallops,Rubs or new Murmurs, No Parasternal Heave +ve B.Sounds, Abd Soft, No tenderness, No rebound - guarding or rigidity. No Cyanosis, Clubbing or edema, No new Rash or bruise  Data Reviewed: I have personally reviewed following labs and imaging studies  CBC: Recent Labs  Lab 01/19/21 0920 01/19/21 1323 01/19/21 1756 01/20/21 0545 01/20/21 1207  WBC 9.2 7.6 7.8 3.9* 4.6  NEUTROABS 7.4 5.4 5.7 2.7  --   HGB 8.3* 6.7* 7.1* 8.0* 8.9*  HCT 23.5* 19.6* 20.3* 23.6* 25.8*  MCV 103.5* 105.4* 107.4* 102.6* 103.2*  PLT 129* 100* DCLMP 69* 84*    Basic Metabolic Panel: Recent Labs  Lab 01/19/21 0920 01/19/21 1756 01/20/21 0545 01/20/21 1207  NA 138 135 135 138  K 3.6 3.5 3.2* 3.8  CL 100 105 107 109  CO2 22 22 22  21*  GLUCOSE 182* 125* 103* 97  BUN 48* 32* 24* 22   CREATININE 1.00 0.90 0.91 0.98  CALCIUM 9.4 8.6* 8.0* 8.2*  MG 1.2* 1.8 1.7 2.0  PHOS  --  2.3* 2.4* 2.4*    GFR: Estimated Creatinine Clearance: 69 mL/min (by C-G formula based on SCr of 0.98 mg/dL).  Liver Function Tests: Recent Labs  Lab 01/19/21 0920 01/19/21 1756 01/20/21 0545 01/20/21 1207  AST 45* 44* 48* 72*  ALT 38 37 35 42  ALKPHOS 67 60 53 60  BILITOT 1.7* 1.6* 1.3* 1.3*  PROT 7.4 6.3* 5.5* 6.0*  ALBUMIN 4.4 3.5 3.0* 3.2*    CBG: No results for input(s): GLUCAP in the last 168 hours.   Recent Results (from the past 240 hour(s))  Resp Panel by RT-PCR (Flu A&B, Covid) Nasopharyngeal Swab     Status: None   Collection Time: 01/19/21  9:03 AM   Specimen: Nasopharyngeal Swab; Nasopharyngeal(NP) swabs in vial transport medium  Result Value Ref Range Status   SARS Coronavirus 2 by RT PCR NEGATIVE NEGATIVE Final    Comment: (NOTE) SARS-CoV-2 target nucleic acids are NOT DETECTED.  The SARS-CoV-2 RNA is generally detectable in upper respiratory specimens during the acute phase of infection. The lowest concentration of SARS-CoV-2 viral copies this assay can detect is 138 copies/mL. A negative result does not preclude SARS-Cov-2 infection and should not be used as the sole basis for treatment or other patient management decisions. A negative result may occur with  improper specimen collection/handling, submission of specimen other than nasopharyngeal swab, presence of viral mutation(s) within the areas targeted by this assay, and inadequate number of viral copies(<138 copies/mL). A negative result must be combined with clinical observations, patient history, and epidemiological information. The expected result is Negative.  Fact Sheet for Patients:  03/21/21  Fact Sheet for Healthcare Providers:  BloggerCourse.com  This test is no t yet approved or cleared by the [employer] FDA and  has been  authorized for detection and/or diagnosis of SARS-CoV-2 by FDA under an Emergency Use Authorization (EUA). This EUA will remain  in effect (meaning this test can be used) for the duration of the COVID-19 declaration under Section 564(b)(1) of the Act, 21 U.S.C.section 360bbb-3(b)(1), unless the authorization is terminated  or revoked sooner.       Influenza A by PCR NEGATIVE NEGATIVE Final   Influenza B by PCR NEGATIVE NEGATIVE Final    Comment: (NOTE) The Xpert Xpress SARS-CoV-2/FLU/RSV plus assay is intended as an aid in the diagnosis of influenza from Nasopharyngeal swab specimens and should not be used as a sole basis for treatment. Nasal washings and aspirates are unacceptable for Xpert Xpress SARS-CoV-2/FLU/RSV testing.  Fact Sheet for Patients: [place]  Fact Sheet for Healthcare Providers: BloggerCourse.com  This test is not yet approved or cleared by the [employer]  States FDA and has been authorized for detection and/or diagnosis of SARS-CoV-2 by FDA under an Emergency Use Authorization (EUA). This EUA will remain in effect (meaning this test can be used) for the duration of the COVID-19 declaration under Section 564(b)(1) of the Act, 21 U.S.C. section 360bbb-3(b)(1), unless the authorization is terminated or revoked.  Performed at [facility], [address] [place], [name] [address]          Radiology Studies: DG Chest 1 View  Result Date: 01/19/2021 CLINICAL DATA:  Evaluate for pneumonia, recent fall EXAM: CHEST  1 VIEW COMPARISON:  None. FINDINGS: The heart size and mediastinal contours are within normal limits. Both lungs are clear. Remote right rib fractures. No acute osseous abnormality. IMPRESSION: No acute cardiac or pulmonary abnormality. Electronically Signed   By: Wiliam Ke MD   On: 01/19/2021 09:37   CT Head Wo Contrast  Result Date: 01/19/2021 CLINICAL DATA:  Head  trauma, abnormal mental status (Age 51-64y). Facial trauma. Additional history provided: Fall this morning, abrasion to nose, slurred speech. EXAM: CT HEAD WITHOUT CONTRAST CT MAXILLOFACIAL WITHOUT CONTRAST TECHNIQUE: Multidetector CT imaging of the head and maxillofacial structures were performed using the standard protocol without intravenous contrast. Multiplanar CT image reconstructions of the maxillofacial structures were also generated. COMPARISON:  Head CT 01/11/2021. FINDINGS: CT HEAD FINDINGS Brain: Mild generalized cerebral atrophy. Mild patchy and ill-defined hypoattenuation within the cerebral white matter, nonspecific but compatible with chronic small vessel ischemic disease. Redemonstrated left middle cranial fossa arachnoid cyst, anterior to the left temporal lobe, measuring 4.4 x 3.3 cm in transaxial dimensions. There is no acute intracranial hemorrhage. No demarcated cortical infarct. No extra-axial fluid collection. No evidence of an intracranial mass. No midline shift. Vascular: No hyperdense vessel.  Atherosclerotic calcifications. Skull: Normal. Negative for fracture or focal lesion. CT MAXILLOFACIAL FINDINGS Osseous: No acute maxillofacial fracture is identified. Orbits: No acute finding within the orbits. The globes are normal in size and contour. The extraocular muscles and optic nerve sheath complexes are symmetric and unremarkable. Sinuses: Large left maxillary sinus mucous retention cyst. Small mucous retention cyst within a posterior left ethmoid air cell. Mucosal thickening within an anterior right ethmoid air cell. Soft tissues: No significant maxillofacial soft tissue swelling is appreciable by CT. IMPRESSION: CT head: 1. No evidence of acute intracranial abnormality. 2. Mild generalized cerebral atrophy and cerebral white matter chronic small vessel ischemic disease. 3. Redemonstrated left middle cranial fossa arachnoid cyst. CT maxillofacial: 1. No evidence of acute maxillofacial  fracture. 2. Paranasal sinus disease as described. Electronically Signed   By: Jackey Loge DO   On: 01/19/2021 10:12   CT CHEST ABDOMEN PELVIS W CONTRAST  Result Date: 01/19/2021 CLINICAL DATA:  60 year old male status post fall this morning. Acute L1 compression fracture secondary to a fall on 01/09/21-01/11/21. EXAM: CT CHEST, ABDOMEN, AND PELVIS WITH CONTRAST TECHNIQUE: Multidetector CT imaging of the chest, abdomen and pelvis was performed following the standard protocol during bolus administration of intravenous contrast. CONTRAST:  80mL OMNIPAQUE IOHEXOL 300 MG/ML  SOLN COMPARISON:  Lumbar spine CT 01/11/2021. FINDINGS: CT CHEST FINDINGS Cardiovascular: Normal thoracic aorta. Calcified coronary artery atherosclerosis on series 2, image 33. No cardiomegaly or pericardial effusion. Other central mediastinal vascular structures appear intact. Mediastinum/Nodes: Negative, no mediastinal hematoma or lymphadenopathy. Lungs/Pleura: Lung volumes appear normal. Major airways are patent. Minor calcified scarring in both lung apices. Minimal dependent atelectasis or scarring in both lungs, which otherwise appear clear. Musculoskeletal: There is moderate to severe anterior  wedge compression fracture of the T6 vertebral body. Minimal retropulsion of the posteroinferior endplate. T6 pedicles and posterior elements appear intact. This fracture is age indeterminate, although absence of paraspinal soft tissue swelling and lucent fracture line suggest this is chronic. Other thoracic vertebrae are intact. Intact sternum. Visible shoulder osseous structures are intact. Chronic right lateral (6 through 8) rib fractures. Chronic right posterior 10th and 11th rib fractures. Chronic left 9th through 11th rib fractures. No acute rib fracture identified. CT ABDOMEN PELVIS FINDINGS Hepatobiliary: Hepatic steatosis. Otherwise negative liver and gallbladder. Pancreas: Negative. Spleen: Negative; subtle low-density along some of the  splenic capsule (series 5, image 36 and series 2, image 56) could be trace subcapsular hematoma. This appears unchanged on the delayed images. But otherwise the spleen appears intact. No perisplenic fluid. Incidental splenule (normal variant). Adrenals/Urinary Tract: Normal adrenal glands. Nonobstructed kidneys appear intact. Bilateral nephrolithiasis measuring 5-6 mm. Symmetric renal enhancement and contrast excretion. Normal ureters. Incidental pelvic phleboliths. Unremarkable bladder. Stomach/Bowel: Negative large bowel aside from sigmoid diverticulosis. Normal appendix on series 2, image 100. Negative terminal ileum. No dilated small bowel. Negative stomach and duodenum. No free air, free fluid. Vascular/Lymphatic: Mild Calcified aortic atherosclerosis. Major arterial structures appear patent and intact. Portal venous system is patent. No lymphadenopathy. Reproductive: Negative. Other: No pelvic free fluid. Musculoskeletal: Comminuted central L1 vertebral body fracture with progressed and now severe central loss of vertebral body height (series 6, image 64). Minimal retropulsion. L1 pedicles and posterior elements remain intact. Lucent fracture planes remain visible on series 4, image 158. But there is no significant paraspinal soft tissue swelling. Other lumbar levels appear stable and intact. Sacrum and SI joints appear stable and intact. Pelvis and proximal femurs appear intact. IMPRESSION: 1. Progressed L1 compression fracture since 01/11/2021 now with severe central loss of vertebral body height. Minimal retropulsion, and no other complicating features. 2. Age indeterminate but probably chronic wedge compression fracture of T6. Multiple bilateral chronic rib fractures. 3. Possible trace Subcapsular Hematoma of the Spleen, which otherwise appears intact. No hemoperitoneum. 4. No other acute traumatic injury identified in the chest, abdomen, or pelvis. 5. Hepatic steatosis. Bilateral nephrolithiasis. Sigmoid  diverticulosis. Calcified coronary artery atherosclerosis. Electronically Signed   By: Odessa Fleming M.D.   On: 01/19/2021 11:38   ECHOCARDIOGRAM COMPLETE  Result Date: 01/20/2021    ECHOCARDIOGRAM REPORT   Patient Name:   Richard Andrews Date of Exam: 01/19/2021 Medical Rec #:  [id number]     Height:       72.0 in Accession #:    [id number]    Weight:       135.7 lb Date of Birth:  07/14/1960     BSA:          1.807 m Patient Age:    60 years      BP:           123/82 mmHg Patient Gender: M             HR:           86 bpm. Exam Location:  Inpatient Procedure: 2D Echo, Color Doppler and Cardiac Doppler Indications:    CHF  History:        Patient has no prior history of Echocardiogram examinations.                 Risk Factors:Current Smoker. ETOH abuse suffered two falls.                 Broke  vertebrae.  Sonographer:    Roosvelt Maser RDCS Referring Phys: [id number] CURTIS J WOODS IMPRESSIONS  1. Left ventricular ejection fraction, by estimation, is 60 to 65%. The left ventricle has normal function. The left ventricle has no regional wall motion abnormalities. Left ventricular diastolic parameters are consistent with Grade I diastolic dysfunction (impaired relaxation).  2. Right ventricular systolic function is normal. The right ventricular size is normal.  3. The mitral valve is normal in structure. No evidence of mitral valve regurgitation. No evidence of mitral stenosis.  4. The aortic valve is normal in structure. Aortic valve regurgitation is not visualized. No aortic stenosis is present.  5. The inferior vena cava is normal in size with greater than 50% respiratory variability, suggesting right atrial pressure of 3 mmHg. FINDINGS  Left Ventricle: Left ventricular ejection fraction, by estimation, is 60 to 65%. The left ventricle has normal function. The left ventricle has no regional wall motion abnormalities. The left ventricular internal cavity size was normal in size. There is  no left ventricular hypertrophy. Left  ventricular diastolic parameters are consistent with Grade I diastolic dysfunction (impaired relaxation). Right Ventricle: The right ventricular size is normal. No increase in right ventricular wall thickness. Right ventricular systolic function is normal. Left Atrium: Left atrial size was normal in size. Right Atrium: Right atrial size was normal in size. Pericardium: There is no evidence of pericardial effusion. Mitral Valve: The mitral valve is normal in structure. No evidence of mitral valve regurgitation. No evidence of mitral valve stenosis. Tricuspid Valve: The tricuspid valve is normal in structure. Tricuspid valve regurgitation is trivial. No evidence of tricuspid stenosis. Aortic Valve: The aortic valve is normal in structure. Aortic valve regurgitation is not visualized. No aortic stenosis is present. Aortic valve mean gradient measures 2.0 mmHg. Aortic valve peak gradient measures 4.6 mmHg. Aortic valve area, by VTI measures 3.03 cm. Pulmonic Valve: The pulmonic valve was normal in structure. Pulmonic valve regurgitation is not visualized. No evidence of pulmonic stenosis. Aorta: The aortic root is normal in size and structure. Venous: The inferior vena cava is normal in size with greater than 50% respiratory variability, suggesting right atrial pressure of 3 mmHg. IAS/Shunts: No atrial level shunt detected by color flow Doppler.  LEFT VENTRICLE PLAX 2D LVIDd:         4.80 cm  Diastology LVIDs:         3.10 cm  LV e' medial:    7.51 cm/s LV PW:         0.90 cm  LV E/e' medial:  7.2 LV IVS:        0.90 cm  LV e' lateral:   9.68 cm/s LVOT diam:     2.10 cm  LV E/e' lateral: 5.6 LV SV:         48 LV SV Index:   26 LVOT Area:     3.46 cm  LEFT ATRIUM             Index LA diam:        2.90 cm 1.61 cm/m LA Vol (A2C):   25.9 ml 14.33 ml/m LA Vol (A4C):   25.7 ml 14.22 ml/m LA Biplane Vol: 27.3 ml 15.11 ml/m  AORTIC VALVE AV Area (Vmax):    3.15 cm AV Area (Vmean):   3.05 cm AV Area (VTI):     3.03 cm  AV Vmax:           107.00 cm/s AV Vmean:  70.600 cm/s AV VTI:            0.158 m AV Peak Grad:      4.6 mmHg AV Mean Grad:      2.0 mmHg LVOT Vmax:         97.30 cm/s LVOT Vmean:        62.100 cm/s LVOT VTI:          0.138 m LVOT/AV VTI ratio: 0.87  AORTA Ao Root diam: 2.70 cm MITRAL VALVE MV Area (PHT): 3.89 cm    SHUNTS MV Decel Time: 195 msec    Systemic VTI:  0.14 m MV E velocity: 54.40 cm/s  Systemic Diam: 2.10 cm MV A velocity: 67.70 cm/s MV E/A ratio:  0.80 Donato Schultz MD Electronically signed by Donato Schultz MD Signature Date/Time: 01/20/2021/6:17:44 AM    Final    CT Maxillofacial WO CM  Result Date: 01/19/2021 CLINICAL DATA:  Head trauma, abnormal mental status (Age 87-64y). Facial trauma. Additional history provided: Fall this morning, abrasion to nose, slurred speech. EXAM: CT HEAD WITHOUT CONTRAST CT MAXILLOFACIAL WITHOUT CONTRAST TECHNIQUE: Multidetector CT imaging of the head and maxillofacial structures were performed using the standard protocol without intravenous contrast. Multiplanar CT image reconstructions of the maxillofacial structures were also generated. COMPARISON:  Head CT 01/11/2021. FINDINGS: CT HEAD FINDINGS Brain: Mild generalized cerebral atrophy. Mild patchy and ill-defined hypoattenuation within the cerebral white matter, nonspecific but compatible with chronic small vessel ischemic disease. Redemonstrated left middle cranial fossa arachnoid cyst, anterior to the left temporal lobe, measuring 4.4 x 3.3 cm in transaxial dimensions. There is no acute intracranial hemorrhage. No demarcated cortical infarct. No extra-axial fluid collection. No evidence of an intracranial mass. No midline shift. Vascular: No hyperdense vessel.  Atherosclerotic calcifications. Skull: Normal. Negative for fracture or focal lesion. CT MAXILLOFACIAL FINDINGS Osseous: No acute maxillofacial fracture is identified. Orbits: No acute finding within the orbits. The globes are normal in size and contour.  The extraocular muscles and optic nerve sheath complexes are symmetric and unremarkable. Sinuses: Large left maxillary sinus mucous retention cyst. Small mucous retention cyst within a posterior left ethmoid air cell. Mucosal thickening within an anterior right ethmoid air cell. Soft tissues: No significant maxillofacial soft tissue swelling is appreciable by CT. IMPRESSION: CT head: 1. No evidence of acute intracranial abnormality. 2. Mild generalized cerebral atrophy and cerebral white matter chronic small vessel ischemic disease. 3. Redemonstrated left middle cranial fossa arachnoid cyst. CT maxillofacial: 1. No evidence of acute maxillofacial fracture. 2. Paranasal sinus disease as described. Electronically Signed   By: Jackey Loge DO   On: 01/19/2021 10:12        Scheduled Meds:  sodium chloride   Intravenous Once   folic acid  1 mg Oral Daily   LORazepam  0-4 mg Intravenous Q4H   Followed by   Melene Muller ON 01/21/2021] LORazepam  0-4 mg Intravenous Q8H   multivitamin with minerals  1 tablet Oral Daily   [START ON 01/23/2021] pantoprazole  40 mg Intravenous Q12H   thiamine  100 mg Oral Daily   Or   thiamine  100 mg Intravenous Daily   Continuous Infusions:  sodium chloride     sodium chloride 75 mL/hr at 01/20/21 0816   pantoprazole 8 mg/hr (01/20/21 0247)   phytonadione (VITAMIN K) IV     potassium PHOSPHATE IVPB (in mmol) 20 mmol (01/20/21 1151)     LOS: 1 day      Huey Bienenstock, MD [facility]  To contact the attending provider between 7A-7P or the covering provider during after hours 7P-7A, please log into the web site [email or web address] and access using universal [place] password for that web site. If you do not have the password, please call the hospital operator.  01/20/2021, 1:02 PM

## 2021-01-21 ENCOUNTER — Inpatient Hospital Stay (HOSPITAL_COMMUNITY): Payer: BC Managed Care – PPO

## 2021-01-21 DIAGNOSIS — F10239 Alcohol dependence with withdrawal, unspecified: Secondary | ICD-10-CM | POA: Diagnosis not present

## 2021-01-21 DIAGNOSIS — F10231 Alcohol dependence with withdrawal delirium: Secondary | ICD-10-CM

## 2021-01-21 DIAGNOSIS — R569 Unspecified convulsions: Secondary | ICD-10-CM | POA: Diagnosis not present

## 2021-01-21 DIAGNOSIS — K2971 Gastritis, unspecified, with bleeding: Secondary | ICD-10-CM

## 2021-01-21 LAB — CBC WITH DIFFERENTIAL/PLATELET
Abs Immature Granulocytes: 0.04 10*3/uL (ref 0.00–0.07)
Basophils Absolute: 0 10*3/uL (ref 0.0–0.1)
Basophils Relative: 0 %
Eosinophils Absolute: 0.1 10*3/uL (ref 0.0–0.5)
Eosinophils Relative: 5 %
HCT: 23.9 % — ABNORMAL LOW (ref 39.0–52.0)
Hemoglobin: 8.4 g/dL — ABNORMAL LOW (ref 13.0–17.0)
Immature Granulocytes: 1 %
Lymphocytes Relative: 19 %
Lymphs Abs: 0.5 10*3/uL — ABNORMAL LOW (ref 0.7–4.0)
MCH: 35.7 pg — ABNORMAL HIGH (ref 26.0–34.0)
MCHC: 35.1 g/dL (ref 30.0–36.0)
MCV: 101.7 fL — ABNORMAL HIGH (ref 80.0–100.0)
Monocytes Absolute: 0.4 10*3/uL (ref 0.1–1.0)
Monocytes Relative: 15 %
Neutro Abs: 1.7 10*3/uL (ref 1.7–7.7)
Neutrophils Relative %: 60 %
Platelets: 78 10*3/uL — ABNORMAL LOW (ref 150–400)
RBC: 2.35 MIL/uL — ABNORMAL LOW (ref 4.22–5.81)
RDW: 14.2 % (ref 11.5–15.5)
WBC: 2.8 10*3/uL — ABNORMAL LOW (ref 4.0–10.5)
nRBC: 0 % (ref 0.0–0.2)

## 2021-01-21 LAB — COMPREHENSIVE METABOLIC PANEL
ALT: 52 U/L — ABNORMAL HIGH (ref 0–44)
AST: 86 U/L — ABNORMAL HIGH (ref 15–41)
Albumin: 3 g/dL — ABNORMAL LOW (ref 3.5–5.0)
Alkaline Phosphatase: 59 U/L (ref 38–126)
Anion gap: 6 (ref 5–15)
BUN: 13 mg/dL (ref 8–23)
CO2: 23 mmol/L (ref 22–32)
Calcium: 8.3 mg/dL — ABNORMAL LOW (ref 8.9–10.3)
Chloride: 106 mmol/L (ref 98–111)
Creatinine, Ser: 0.83 mg/dL (ref 0.61–1.24)
GFR, Estimated: >60 mL/min (ref >60)
Glucose, Bld: 127 mg/dL — ABNORMAL HIGH (ref 70–99)
Potassium: 4.4 mmol/L (ref 3.5–5.1)
Sodium: 135 mmol/L (ref 135–145)
Total Bilirubin: 1.4 mg/dL — ABNORMAL HIGH (ref 0.3–1.2)
Total Protein: 5.4 g/dL — ABNORMAL LOW (ref 6.5–8.1)

## 2021-01-21 LAB — PHOSPHORUS: Phosphorus: 3.3 mg/dL (ref 2.5–4.6)

## 2021-01-21 LAB — CBC
HCT: 25.8 % — ABNORMAL LOW (ref 39.0–52.0)
Hemoglobin: 9 g/dL — ABNORMAL LOW (ref 13.0–17.0)
MCH: 36.3 pg — ABNORMAL HIGH (ref 26.0–34.0)
MCHC: 34.9 g/dL (ref 30.0–36.0)
MCV: 104 fL — ABNORMAL HIGH (ref 80.0–100.0)
Platelets: 86 10*3/uL — ABNORMAL LOW (ref 150–400)
RBC: 2.48 MIL/uL — ABNORMAL LOW (ref 4.22–5.81)
RDW: 14 % (ref 11.5–15.5)
WBC: 3.4 10*3/uL — ABNORMAL LOW (ref 4.0–10.5)
nRBC: 0 % (ref 0.0–0.2)

## 2021-01-21 LAB — MAGNESIUM: Magnesium: 1.8 mg/dL (ref 1.7–2.4)

## 2021-01-21 MED ORDER — LORAZEPAM 2 MG/ML IJ SOLN
2.0000 mg | Freq: Once | INTRAMUSCULAR | Status: AC
  Administered 2021-01-21: 2 mg via INTRAVENOUS
  Filled 2021-01-21: qty 1

## 2021-01-21 NOTE — Procedures (Signed)
Patient Name: Richard Andrews  MRN: [id number]  Epilepsy Attending: Charlsie Quest  Referring Physician/Provider: Dr Dow Adolph Date: 01/21/2021 Duration: 26.22 mins  Patient history: 60 year old male with seizure-like activity.  EEG to evaluate for seizures.  Level of alertness: Awake, asleep  AEDs during EEG study: Ativan  Technical aspects: This EEG study was done with scalp electrodes positioned according to the 10-20 International system of electrode placement. Electrical activity was acquired at a sampling rate of 500Hz  and reviewed with a high frequency filter of 70Hz  and a low frequency filter of 1Hz . EEG data were recorded continuously and digitally stored.   Description: The posterior dominant rhythm consists of 9 Hz activity of moderate voltage (25-35 uV) seen predominantly in posterior head regions, symmetric and reactive to eye opening and eye closing. Sleep was characterized by vertex waves, sleep spindles (12 to 14 Hz), maximal frontocentral region.  There is an excessive amount of 15 to 18 HzV beta activity distributed symmetrically and diffusely. Physiologic photic driving was seen during photic stimulation. Hyperventilation was not performed.     ABNORMALITY - Excessive beta, generalized  IMPRESSION: This study is within normal limits. No seizures or epileptiform discharges were seen throughout the recording. The excessive beta activity seen in the background is most likely due to the effect of benzodiazepine and is a benign EEG pattern.   [place] 

## 2021-01-21 NOTE — Progress Notes (Addendum)
Received a call regarding the patient having a seizure like activity around 5 AM lasting 5-8 secs.  Per RN patient is now alert.  He is on CIWA protocol for alcohol withdrawal.  EEG ordered.  Seizure precautions in place.  We will continue to closely monitor and treat as indicated.  AddendumMicah Andrews to see the patient at bedside.  He is alert and tremulous requiring frequent high doses of IV ativan.  It has been >3 days since last alcohol intake.  Reports drinking 1.5 pint of liquor per day.    Due to concern for hemodynamic instability in the setting of acute blood loss anemia, consulted PCCM to evaluate for possible Precedex infusion.  Dr. Ardeth Perfect will see in consultation.  We appreciate PCCM's assistance.

## 2021-01-21 NOTE — Progress Notes (Signed)
Upon washing the pt, his body became stiff & then  he begin to shake for 5-8 sec. The pt alert . Notified provider. Provider will be uo

## 2021-01-21 NOTE — Progress Notes (Signed)
EEG complete - results pending 

## 2021-01-21 NOTE — Consult Note (Signed)
NAME:  QUINLAN VOLLMER MRN:  [id number] DOB:  [birth year] LOS: 2 ADMISSION DATE:  01/19/2021  CONSULTATION DATE:  01/21/2021 REFERRING MD:  Irene Pap, DO  REASON FOR CONSULTATION:  delirium tremens   Initial Pulmonary/Critical Care Consultation  Brief History   N/A  History of present illness   This 60 y.o. Caucasian male smoker is seen in consultation at the request of Dr. Irene Pap for recommendations on further evaluation and management of delirium tremens.  The patient presented to [facility] ER on 01/19/2021 via EMS after experiencing a mechanical fall from standing height. The patient was admitted to the Hospitalist service for L1 compression fracture and GI bleed.  PCCM was consulted to see this patient this morning after the patient has required a total of 34 mg Ativan over the past 24 hours and request for consideration of Precedex infusion.  He reportedly had brief convulsive activity, described as lasting 5-8 seconds while receiving a bath this morning.  This was also treated with Ativan.  At the time of clinical encounter, the patient is lying in bed.  He is somewhat cooperative.  He does participate in clinical interview, although he is disoriented to place.  He knows it is 2022, oriented to self, but thinks that we are at his home.  He is a bit tremulous but show no overt seizure activity.  He is not having hallucinations.  He reports that he has frequent sensations of "free falling".  REVIEW OF SYSTEMS Constitutional: No weight loss. No night sweats. No fever. No chills. No fatigue. HEENT: No headaches, dysphagia, sore throat, otalgia, nasal congestion, PND CV:  No chest pain, orthopnea, PND, swelling in lower extremities, palpitations GI:  Melena.  No abdominal pain, nausea, vomiting, diarrhea, change in bowel pattern, anorexia Resp: No DOE, rest dyspnea, cough, mucus, hemoptysis, wheezing  GU: no dysuria, change in color of urine, no urgency or frequency.  No flank  pain. MS:  No joint pain or swelling. No myalgias,  No decreased range of motion.  Psych:  No halluctination.  Tremors.  Convulsive activity x 1. Skin: no rash or lesions.   Past Medical/Surgical/Social/Family History   Past Medical History:  Diagnosis Date   Allergy    Distal radius fracture, left    Hypertension     Past Surgical History:  Procedure Laterality Date   I & D EXTREMITY Right 01/10/2020   Procedure: ORIF RIGHT WRIST;  Surgeon: Roseanne Kaufman, MD;  Location: [place];  Service: Orthopedics;  Laterality: Right;   OPEN REDUCTION INTERNAL FIXATION (ORIF) DISTAL RADIAL FRACTURE Left 01/28/2015   Procedure: OPEN REDUCTION INTERNAL FIXATION (ORIF) LEFT DISTAL RADIAL FRACTURE;  Surgeon: Leandrew Koyanagi, MD;  Location: [place];  Service: Orthopedics;  Laterality: Left;   TONSILLECTOMY      Social History   Tobacco Use   Smoking status: Every Day    Packs/day: 1.00    Years: 10.00    Pack years: 10.00    Types: Cigarettes   Smokeless tobacco: Current    Types: Chew  Substance Use Topics   Alcohol use: Yes    Alcohol/week: 16.0 - 20.0 standard drinks    Types: 16 - 20 Standard drinks or equivalent per week    Comment: 1 gallon/week    Family History  Problem Relation Age of Onset   Heart disease Mother    Hyperlipidemia Mother    Skin cancer Mother    Cancer Father    Multiple myeloma  Sister    Throat cancer Sister      Significant Hospital Events      Consults:  GI PCCM   Procedures:     Significant Diagnostic Tests:  CT:  Progressed L1 compression fracture since 01/11/2021 now with severe central loss of vertebral body height. Minimal retropulsion, and no other complicating features.  Age indeterminate but probably chronic wedge compression fracture of T6. Multiple bilateral chronic rib fractures.  Possible trace Subcapsular Hematoma of the Spleen, which otherwise appears intact. No hemoperitoneum.  No other acute traumatic injury  identified in the chest, abdomen, or pelvis.  Hepatic steatosis. Bilateral nephrolithiasis. Sigmoid diverticulosis. Calcified coronary artery atherosclerosis.   Micro Data:   Results for orders placed or performed during the hospital encounter of 01/19/21  Resp Panel by RT-PCR (Flu A&B, Covid) Nasopharyngeal Swab     Status: None   Collection Time: 01/19/21  9:03 AM   Specimen: Nasopharyngeal Swab; Nasopharyngeal(NP) swabs in vial transport medium  Result Value Ref Range Status   SARS Coronavirus 2 by RT PCR NEGATIVE NEGATIVE Final    Comment: (NOTE) SARS-CoV-2 target nucleic acids are NOT DETECTED.  The SARS-CoV-2 RNA is generally detectable in upper respiratory specimens during the acute phase of infection. The lowest concentration of SARS-CoV-2 viral copies this assay can detect is 138 copies/mL. A negative result does not preclude SARS-Cov-2 infection and should not be used as the sole basis for treatment or other patient management decisions. A negative result may occur with  improper specimen collection/handling, submission of specimen other than nasopharyngeal swab, presence of viral mutation(s) within the areas targeted by this assay, and inadequate number of viral copies(<138 copies/mL). A negative result must be combined with clinical observations, patient history, and epidemiological information. The expected result is Negative.  Fact Sheet for Patients:  EntrepreneurPulse.com.au  Fact Sheet for Healthcare Providers:  IncredibleEmployment.be  This test is no t yet approved or cleared by the [place] FDA and  has been authorized for detection and/or diagnosis of SARS-CoV-2 by FDA under an Emergency Use Authorization (EUA). This EUA will remain  in effect (meaning this test can be used) for the duration of the COVID-19 declaration under Section 564(b)(1) of the Act, 21 U.S.C.section 360bbb-3(b)(1), unless the authorization is  terminated  or revoked sooner.       Influenza A by PCR NEGATIVE NEGATIVE Final   Influenza B by PCR NEGATIVE NEGATIVE Final    Comment: (NOTE) The Xpert Xpress SARS-CoV-2/FLU/RSV plus assay is intended as an aid in the diagnosis of influenza from Nasopharyngeal swab specimens and should not be used as a sole basis for treatment. Nasal washings and aspirates are unacceptable for Xpert Xpress SARS-CoV-2/FLU/RSV testing.  Fact Sheet for Patients: EntrepreneurPulse.com.au  Fact Sheet for Healthcare Providers: IncredibleEmployment.be  This test is not yet approved or cleared by the [place] FDA and has been authorized for detection and/or diagnosis of SARS-CoV-2 by FDA under an Emergency Use Authorization (EUA). This EUA will remain in effect (meaning this test can be used) for the duration of the COVID-19 declaration under Section 564(b)(1) of the Act, 21 U.S.C. section 360bbb-3(b)(1), unless the authorization is terminated or revoked.  Performed at [facility], [place], [place] [address]       Antimicrobials:  Rocephin (8/10, single dose)   Interim history/subjective:  N/A   Objective   BP 112/79 (BP Location: Left Arm)   Pulse (!) 52   Temp 97.7 F (36.5 C) (Oral)  Resp 18   Ht 6' (1.829 m)   Wt 61.6 kg   SpO2 99%   BMI 18.40 kg/m     Filed Weights   01/19/21 0832 01/19/21 1515  Weight: 68.2 kg 61.6 kg    Intake/Output Summary (Last 24 hours) at 01/21/2021 0634 Last data filed at 01/21/2021 0412 Gross per 24 hour  Intake 3441.92 ml  Output 900 ml  Net 2541.92 ml       Examination: GENERAL:  alert or disoriented to place. No acute distress. HEAD: normocephalic, atraumatic EYE: PERRLA, EOM intact, no scleral icterus, no pallor. THROAT/ORAL CAVITY: Normal dentition. No oral thrush. No exudate. Mucous membranes are moist. No tonsillar enlargement.  NECK: supple, no  thyromegaly, no JVD, no lymphadenopathy. Trachea midline. CHEST/LUNG: symmetric in development and expansion. Good air entry. No crackles. No wheezes. HEART: Regular S1 and S2 without murmur, rub or gallop. ABDOMEN: soft, nontender, nondistended. Normoactive bowel sounds. No rebound. No guarding. No hepatosplenomegaly. EXTREMITIES: Edema: none. No cyanosis. No clubbing. 2+ DP pulses LYMPHATIC: no cervical/axillary/inguinal lymph nodes appreciated SKIN:  No rash or lesion. NEUROLOGIC: Doll's eyes intact. Corneal reflex intact. Spontaneous respirations intact. Cranial nerves II-XII are grossly symmetric and physiologic. Babinski absent. No sensory deficit. Motor: 5/5 @ RUE, 5/5 @ LUE, 5/5 @ RLL,  5/5 @ LLL.  DTR: 2+ @ R biceps, 2+ @ L biceps, 2+ @ R patellar,  2+ @ L patellar. Asterixis.  Gait was not assessed.   Resolved Hospital Problem list      Assessment & Plan:   ASSESSMENT/PLAN:  ASSESSMENT (included in the Hospital Problem List)  Principal Problem:   GI bleed Active Problems:   Alcohol withdrawal seizure with complication (HCC)   Increased ammonia level   Thrombocytopenia (HCC)   QT prolongation   Essential hypertension   Macrocytic anemia   Acute blood loss anemia   By systems: PULMONARY: No acute issues Supplemental oxygen as need to maintain SpO2 93+%.   CARDIOVASCULAR Essential hypertension Management per primary team   RENAL: No acute issues   GASTROINTESTINAL GI bleed with melenotic stool GI input appreciated GI PROPHYLAXIS: per primary team   HEMATOLOGIC Thrombocytopenia DVT PROPHYLAXIS: SCDs without chemoprophylaxis in light of ongoing GI bleed   INFECTIOUS: No acute issues   ENDOCRINE: No acute issues   NEUROLOGIC Delirium tremens with convulsions Possible alcohol withdrawal seizures Ativan 2 mg IV additional dose (x1) now Continue CIWA scoring and treatment per CIWA protocol   PLAN/RECOMMENDATIONS  Transfer to ICU level of care is not  warranted at this time.     My assessment, plan of care, findings, medications, side effects, etc. were discussed with: nurse.   Best practice:  Diet: per primary team Pain/Anxiety/Delirium protocol (if indicated): per primary team VAP protocol (if indicated): N/A DVT prophylaxis: SCDs GI prophylaxis: per primary team Glucose control: N/A Mobility/Activity: bedrest   Code Status: Full Code Family Communication:   no family at bedside Disposition: keep patient in [place]   CBC: Recent Labs  Lab 01/19/21 0920 01/19/21 1323 01/19/21 1756 01/20/21 0545 01/20/21 1207 01/20/21 1709  WBC 9.2 7.6 7.8 3.9* 4.6 3.6*  NEUTROABS 7.4 5.4 5.7 2.7  --   --   HGB 8.3* 6.7* 7.1* 8.0* 8.9* 8.0*  HCT 23.5* 19.6* 20.3* 23.6* 25.8* 23.2*  MCV 103.5* 105.4* 107.4* 102.6* 103.2* 102.7*  PLT 129* 100* DCLMP 69* 84* 73*    Basic Metabolic Panel: Recent Labs  Lab 01/19/21 0920 01/19/21 1756 01/20/21 0545 01/20/21 1207  NA 138 135 135 138  K 3.6 3.5 3.2* 3.8  CL 100 105 107 109  CO2 _0 21*  GLUCOSE 182* 125* 103* 97  BUN 48* 32* 24* 22  CREATININE 1.00 0.90 0.91 0.98  CALCIUM 9.4 8.6* 8.0* 8.2*  MG 1.2* 1.8 1.7 2.0  PHOS  --  2.3* 2.4* 2.4*   GFR: Estimated Creatinine Clearance: 69 mL/min (by C-G formula based on SCr of 0.98 mg/dL). Recent Labs  Lab 01/19/21 0920 01/19/21 1128 01/19/21 1323 01/19/21 1756 01/20/21 0545 01/20/21 1207 01/20/21 1709  WBC 9.2  --    < > 7.8 3.9* 4.6 3.6*  LATICACIDVEN 3.1* 2.7*  --   --   --   --   --    < > = values in this interval not displayed.    Liver Function Tests: Recent Labs  Lab 01/19/21 0920 01/19/21 1756 01/20/21 0545 01/20/21 1207  AST 45* 44* 48* 72*  ALT 38 37 35 42  ALKPHOS 67 60 53 60  BILITOT 1.7* 1.6* 1.3* 1.3*  PROT 7.4 6.3* 5.5* 6.0*  ALBUMIN 4.4 3.5 3.0* 3.2*   Recent Labs  Lab 01/19/21 0920  LIPASE 49   Recent Labs  Lab 01/19/21 1756  AMMONIA 23    ABG No results found for: PHART,  PCO2ART, PO2ART, HCO3, TCO2, ACIDBASEDEF, O2SAT   Coagulation Profile: Recent Labs  Lab 01/19/21 0920  INR 1.4*    Cardiac Enzymes: No results for input(s): CKTOTAL, CKMB, CKMBINDEX, TROPONINI in the last 168 hours.  HbA1C: Hgb A1c MFr Bld  Date/Time Value Ref Range Status  03/18/2016 08:19 AM 5.2 <5.7 % Final    Comment:      For the purpose of screening for the presence of diabetes:   <5.7%       Consistent with the absence of diabetes 5.7-6.4 %   Consistent with increased risk for diabetes (prediabetes) >=6.5 %     Consistent with diabetes   This assay result is consistent with a decreased risk of diabetes.   Currently, no consensus exists regarding use of hemoglobin A1c for diagnosis of diabetes in children.   According to American Diabetes Association (ADA) guidelines, hemoglobin A1c <7.0% represents optimal control in non-pregnant diabetic patients. Different metrics may apply to specific patient populations. Standards of Medical Care in Diabetes (ADA).       CBG: No results for input(s): GLUCAP in the last 168 hours.   Review of Systems:   See above   Past Medical History   Past Medical History:  Diagnosis Date   Allergy    Distal radius fracture, left    Hypertension       Surgical History    Past Surgical History:  Procedure Laterality Date   I & D EXTREMITY Right 01/10/2020   Procedure: ORIF RIGHT WRIST;  Surgeon: Roseanne Kaufman, MD;  Location: [place];  Service: Orthopedics;  Laterality: Right;   OPEN REDUCTION INTERNAL FIXATION (ORIF) DISTAL RADIAL FRACTURE Left 01/28/2015   Procedure: OPEN REDUCTION INTERNAL FIXATION (ORIF) LEFT DISTAL RADIAL FRACTURE;  Surgeon: Leandrew Koyanagi, MD;  Location: [place];  Service: Orthopedics;  Laterality: Left;   TONSILLECTOMY        Social History   Social History   Socioeconomic History   Marital status: Married    Spouse name: Margreta Journey   Number of children: 5   Years of education:  12   Highest education level: Not on file  Occupational History  Occupation: Government social research officer  Tobacco Use   Smoking status: Every Day    Packs/day: 1.00    Years: 10.00    Pack years: 10.00    Types: Cigarettes   Smokeless tobacco: Current    Types: Chew  Vaping Use   Vaping Use: Never used  Substance and Sexual Activity   Alcohol use: Yes    Alcohol/week: 16.0 - 20.0 standard drinks    Types: 16 - 20 Standard drinks or equivalent per week    Comment: 1 gallon/week   Drug use: Never   Sexual activity: Yes    Partners: Female  Other Topics Concern   Not on file  Social History Narrative   Not on file   Social Determinants of Health   Financial Resource Strain: Not on file  Food Insecurity: Not on file  Transportation Needs: Not on file  Physical Activity: Not on file  Stress: Not on file  Social Connections: Not on file      Family History    Family History  Problem Relation Age of Onset   Heart disease Mother    Hyperlipidemia Mother    Skin cancer Mother    Cancer Father    Multiple myeloma Sister    Throat cancer Sister    family history includes Cancer in his father; Heart disease in his mother; Hyperlipidemia in his mother; Multiple myeloma in his sister; Skin cancer in his mother; Throat cancer in his sister.    Allergies No Known Allergies    Current Medications  Current Facility-Administered Medications:    0.9 %  sodium chloride infusion (Manually program via Guardrails IV Fluids), , Intravenous, Once, Allie Bossier, MD   0.9 %  sodium chloride infusion, 10 mL/hr, Intravenous, Once, Arnaldo Natal, MD   0.9 %  sodium chloride infusion, , Intravenous, Continuous, Elgergawy, Silver Huguenin, MD, Last Rate: 75 mL/hr at 01/20/21 2017, New Bag at 01/20/21 2017   acetaminophen (TYLENOL) tablet 650 mg, 650 mg, Oral, Q6H PRN **OR** acetaminophen (TYLENOL) suppository 650 mg, 650 mg, Rectal, Q6H PRN, Allie Bossier, MD   folic acid (FOLVITE) tablet 1 mg, 1  mg, Oral, Daily, Allie Bossier, MD, 1 mg at 01/20/21 0837   LORazepam (ATIVAN) injection 0-4 mg, 0-4 mg, Intravenous, Q4H, 2 mg at 01/21/21 9563 **FOLLOWED BY** LORazepam (ATIVAN) injection 0-4 mg, 0-4 mg, Intravenous, Q8H, Allie Bossier, MD   LORazepam (ATIVAN) tablet 1-4 mg, 1-4 mg, Oral, Q1H PRN **OR** LORazepam (ATIVAN) injection 1-4 mg, 1-4 mg, Intravenous, Q1H PRN, Allie Bossier, MD, 4 mg at 01/21/21 0550   multivitamin with minerals tablet 1 tablet, 1 tablet, Oral, Daily, Elgergawy, Silver Huguenin, MD   octreotide (SANDOSTATIN) 500 mcg in sodium chloride 0.9 % 250 mL (2 mcg/mL) infusion, 50 mcg/hr, Intravenous, Continuous, Esterwood, Amy S, PA-C, Last Rate: 25 mL/hr at 01/20/21 2055, 50 mcg/hr at 01/20/21 2055   oxyCODONE (Oxy IR/ROXICODONE) immediate release tablet 5 mg, 5 mg, Oral, Q4H PRN, Allie Bossier, MD, 5 mg at 01/20/21 0513   [START ON 01/23/2021] pantoprazole (PROTONIX) injection 40 mg, 40 mg, Intravenous, Q12H, Allie Bossier, MD   pantoprozole (PROTONIX) 80 mg /NS 100 mL infusion, 8 mg/hr, Intravenous, Continuous, Allie Bossier, MD, Last Rate: 10 mL/hr at 01/21/21 0217, 8 mg/hr at 01/21/21 0217   thiamine tablet 100 mg, 100 mg, Oral, Daily, 100 mg at 01/20/21 0837 **OR** thiamine (B-1) injection 100 mg, 100 mg, Intravenous, Daily, Allie Bossier, MD  Home Medications  Prior to Admission medications   Medication Sig Start Date End Date Taking? Authorizing Provider  Ascorbic Acid (VITAMIN C) 1000 MG tablet Take 1,000 mg by mouth daily.   Yes [provider]  thiamine (VITAMIN B-1) 100 MG tablet Take 1 tablet (100 mg total) by mouth daily. 02/24/20  Yes Samuel Bouche, NP  vitamin B-12 (CYANOCOBALAMIN) 1000 MCG tablet Take 1 tablet (1,000 mcg total) by mouth daily. 02/24/20  Yes Samuel Bouche, NP  ibuprofen (ADVIL) 200 MG tablet Take 400 mg by mouth See admin instructions. Take two tablets (400 mg) by mouth in the morning on work days    [provider]       Critical care time: 30 minutes.  The treatment and management of the patient's condition was required based on the threat of imminent deterioration. This time reflects time spent by the physician evaluating, providing care and managing the critically ill patient's care. The time was spent at the immediate bedside (or on the same floor/unit and dedicated to this patient's care). Time involved in separately billable procedures is NOT included int he critical care time indicated above. Family meeting and update time may be included above if and only if the patient is unable/incompetent to participate in clinical interview and/or decision making, and the discussion was necessary to determining treatment decisions.    Renee Pain, MD Board Certified by the ABIM, [place]

## 2021-01-21 NOTE — Progress Notes (Signed)
PROGRESS NOTE    Richard Andrews  ZOX:[id number] DOB: 05/03/1961 DOA: 01/19/2021 PCP: Christen ButterJessup, Joy, NP  Chief Complaint  Patient presents with   Fall    Brief Narrative:   60 yo WM PMHx EtOH abuse, Increased ammonia level, thrombocytopenia, macrocytic anemia   Patient was admitted with a fall, recent ED visit with diagnosis of L1 compression fracture, work-up in ED significant for anemia, melena, Hemoccult positive stools, prior PRBC transfusion, as well patient developed alcohol withdrawals.  Patient is unstable for endoscopy given his severe withdrawals.  Assessment & Plan:   Principal Problem:   GI bleed Active Problems:   Essential hypertension   Increased ammonia level   Thrombocytopenia (HCC)   Macrocytic anemia   QT prolongation   Acute blood loss anemia   Alcohol withdrawal seizure with complication (HCC)  Acute blood loss anemia due to GI bleed -Patient presents with acute blood loss anemia, due to GI bleed, unclear upper versus lower, but he does present with melena, hemoglobin was 14.410 days ago, currently 6.7 on admission, transfused 2 units PRBC, hemoglobin remained stable. -EGD was canceled yesterday due to his active DTs -H&H remained stable posttransfusion, continue with empiric PPI drip and octreotide -Now 22 monitor H&H closely, transfuse for hemoglobin<7. -Received 5 mg of IV vitamin K per GI due to coagulopathy  Thrombocytopenia -Most likely secondary to alcohol abuse, -Continue to monitor, no indication for transfusion  EtOH abuse with active DT's -Currently with active DTs, he is on stepdown CIWA protocol,he already received around 24 mg of IV Ativan over the last 12 hours -CCM input appreciated, so far no indication for Precedex drip. -Continue with thiamine and folic acid. -Patient with an episode of tremors, questionable for seizures, but EEG this morning with no acute findings  Essential HTN -Currently controlled without medication - Would hold  any BP lowering medication given patient's GI bleed. - Monitor closely   QT prolongation - Hold all QT prolongation medication   Hypomagnesmia -Magnesium goal> 2    DVT prophylaxis: SCD, no chemical prophylaxis due to coagulopathy and thrombocytopenia Code Status: Full Family Communication: D/W wife by phone Disposition:   Status is: Inpatient  Remains inpatient appropriate because:IV treatments appropriate due to intensity of illness or inability to take PO  Dispo: The patient is from: Home              Anticipated d/c is to: Home              Patient currently is not medically stable to d/c.   Difficult to place patient No       Consultants:  GI PCCM   Subjective:  Himself unable to provide any complaints, per staff patient is restless, agitated overnight, he had an episode of generalized shaking .  Objective: Vitals:   01/21/21 0749 01/21/21 0849 01/21/21 0949 01/21/21 1050  BP: (!) 122/93 (!) 128/97 (!) 163/88 (!) 145/94  Pulse: 60 72 (!) 59 60  Resp: 20 17 15 16   Temp:   97.8 F (36.6 C)   TempSrc:   Oral   SpO2: 100% 99% 100% 100%  Weight:      Height:        Intake/Output Summary (Last 24 hours) at 01/21/2021 1217 Last data filed at 01/21/2021 0700 Gross per 24 hour  Intake 2471.83 ml  Output 750 ml  Net 1721.83 ml   Filed Weights   01/19/21 0832 01/19/21 1515  Weight: 68.2 kg 61.6 kg    Examination:  Lethargic, eyes closed, but open to verbal stimuli , intermittent agitation, confused, mumbling Symmetrical Chest wall movement, Good air movement bilaterally, CTAB RRR,No Gallops,Rubs or new Murmurs, No Parasternal Heave +ve B.Sounds, Abd Soft, No tenderness, No rebound - guarding or rigidity. No Cyanosis, Clubbing or edema, No new Rash or bruise       Data Reviewed: I have personally reviewed following labs and imaging studies  CBC: Recent Labs  Lab 01/19/21 0920 01/19/21 1323 01/19/21 1756 01/20/21 0545 01/20/21 1207  01/20/21 1709 01/21/21 0621  WBC 9.2 7.6 7.8 3.9* 4.6 3.6* 2.8*  NEUTROABS 7.4 5.4 5.7 2.7  --   --  1.7  HGB 8.3* 6.7* 7.1* 8.0* 8.9* 8.0* 8.4*  HCT 23.5* 19.6* 20.3* 23.6* 25.8* 23.2* 23.9*  MCV 103.5* 105.4* 107.4* 102.6* 103.2* 102.7* 101.7*  PLT 129* 100* DCLMP 69* 84* 73* 78*    Basic Metabolic Panel: Recent Labs  Lab 01/19/21 0920 01/19/21 1756 01/20/21 0545 01/20/21 1207 01/21/21 0621  NA 138 135 135 138 135  K 3.6 3.5 3.2* 3.8 4.4  CL 100 105 107 109 106  CO2 22 22 22  21* 23  GLUCOSE 182* 125* 103* 97 127*  BUN 48* 32* 24* 22 13  CREATININE 1.00 0.90 0.91 0.98 0.83  CALCIUM 9.4 8.6* 8.0* 8.2* 8.3*  MG 1.2* 1.8 1.7 2.0 1.8  PHOS  --  2.3* 2.4* 2.4* 3.3    GFR: Estimated Creatinine Clearance: 81.4 mL/min (by C-G formula based on SCr of 0.83 mg/dL).  Liver Function Tests: Recent Labs  Lab 01/19/21 0920 01/19/21 1756 01/20/21 0545 01/20/21 1207 01/21/21 0621  AST 45* 44* 48* 72* 86*  ALT 38 37 35 42 52*  ALKPHOS 67 60 53 60 59  BILITOT 1.7* 1.6* 1.3* 1.3* 1.4*  PROT 7.4 6.3* 5.5* 6.0* 5.4*  ALBUMIN 4.4 3.5 3.0* 3.2* 3.0*    CBG: No results for input(s): GLUCAP in the last 168 hours.   Recent Results (from the past 240 hour(s))  Resp Panel by RT-PCR (Flu A&B, Covid) Nasopharyngeal Swab     Status: None   Collection Time: 01/19/21  9:03 AM   Specimen: Nasopharyngeal Swab; Nasopharyngeal(NP) swabs in vial transport medium  Result Value Ref Range Status   SARS Coronavirus 2 by RT PCR NEGATIVE NEGATIVE Final    Comment: (NOTE) SARS-CoV-2 target nucleic acids are NOT DETECTED.  The SARS-CoV-2 RNA is generally detectable in upper respiratory specimens during the acute phase of infection. The lowest concentration of SARS-CoV-2 viral copies this assay can detect is 138 copies/mL. A negative result does not preclude SARS-Cov-2 infection and should not be used as the sole basis for treatment or other patient management decisions. A negative result may  occur with  improper specimen collection/handling, submission of specimen other than nasopharyngeal swab, presence of viral mutation(s) within the areas targeted by this assay, and inadequate number of viral copies(<138 copies/mL). A negative result must be combined with clinical observations, patient history, and epidemiological information. The expected result is Negative.  Fact Sheet for Patients:  03/21/21  Fact Sheet for Healthcare Providers:  BloggerCourse.com  This test is no t yet approved or cleared by the [place] FDA and  has been authorized for detection and/or diagnosis of SARS-CoV-2 by FDA under an Emergency Use Authorization (EUA). This EUA will remain  in effect (meaning this test can be used) for the duration of the COVID-19 declaration under Section 564(b)(1) of the Act, 21 U.S.C.section 360bbb-3(b)(1), unless the authorization is terminated  or  revoked sooner.       Influenza A by PCR NEGATIVE NEGATIVE Final   Influenza B by PCR NEGATIVE NEGATIVE Final    Comment: (NOTE) The Xpert Xpress SARS-CoV-2/FLU/RSV plus assay is intended as an aid in the diagnosis of influenza from Nasopharyngeal swab specimens and should not be used as a sole basis for treatment. Nasal washings and aspirates are unacceptable for Xpert Xpress SARS-CoV-2/FLU/RSV testing.  Fact Sheet for Patients: BloggerCourse.com  Fact Sheet for Healthcare Providers: [employer]ious[employer]ker.it  This test is not yet approved or cleared by the [place] FDA and has been authorized for detection and/or diagnosis of SARS-CoV-2 by FDA under an Emergency Use Authorization (EUA). This EUA will remain in effect (meaning this test can be used) for the duration of the COVID-19 declaration under Section 564(b)(1) of the Act, 21 U.S.C. section 360bbb-3(b)(1), unless the authorization is terminated  or revoked.  Performed at [facility], [address], [place], [name] [address]          Radiology Studies: EEG adult  Result Date: 01-27-21 Charlsie Quest, MD     2021/01/27 11:25 AM Patient Name: Richard Andrews MRN: [id number] Epilepsy Attending: Charlsie Quest Referring Physician/Provider: Dr Dow Adolph Date: 2021-01-27 Duration: 26.22 mins Patient history: 60 year old male with seizure-like activity.  EEG to evaluate for seizures. Level of alertness: Awake, asleep AEDs during EEG study: Ativan Technical aspects: This EEG study was done with scalp electrodes positioned according to the 10-20 International system of electrode placement. Electrical activity was acquired at a sampling rate of  and reviewed with a high frequency filter of  and a low frequency filter of . EEG data were recorded continuously and digitally stored. Description: The posterior dominant rhythm consists of 9 Hz activity of moderate voltage (25-35 uV) seen predominantly in posterior head regions, symmetric and reactive to eye opening and eye closing. Sleep was characterized by vertex waves, sleep spindles (12 to 14 Hz), maximal frontocentral region.  There is an excessive amount of 15 to 18 HzV beta activity distributed symmetrically and diffusely. Physiologic photic driving was seen during photic stimulation. Hyperventilation was not performed.   ABNORMALITY - Excessive beta, generalized IMPRESSION: This study is within normal limits. No seizures or epileptiform discharges were seen throughout the recording. The excessive beta activity seen in the background is most likely due to the effect of benzodiazepine and is a benign EEG pattern. Charlsie Quest   ECHOCARDIOGRAM COMPLETE  Result Date: 01/20/2021    ECHOCARDIOGRAM REPORT   Patient Name:   JAQUON GINGERICH Date of Exam: 01/19/2021 Medical Rec #:  [id number]     Height:       72.0 in Accession #:    [id number]    Weight:       135.7 lb  Date of Birth:  07-04-1960     BSA:          1.807 m Patient Age:    60 years      BP:           123/82 mmHg Patient Gender: M             HR:           86 bpm. Exam Location:  Inpatient Procedure: 2D Echo, Color Doppler and Cardiac Doppler Indications:    CHF  History:        Patient has no prior history of Echocardiogram examinations.  Risk Factors:Current Smoker. ETOH abuse suffered two falls.                 Broke vertebrae.  Sonographer:    Roosvelt Maser RDCS Referring Phys: [id number] CURTIS J WOODS IMPRESSIONS  1. Left ventricular ejection fraction, by estimation, is 60 to 65%. The left ventricle has normal function. The left ventricle has no regional wall motion abnormalities. Left ventricular diastolic parameters are consistent with Grade I diastolic dysfunction (impaired relaxation).  2. Right ventricular systolic function is normal. The right ventricular size is normal.  3. The mitral valve is normal in structure. No evidence of mitral valve regurgitation. No evidence of mitral stenosis.  4. The aortic valve is normal in structure. Aortic valve regurgitation is not visualized. No aortic stenosis is present.  5. The inferior vena cava is normal in size with greater than 50% respiratory variability, suggesting right atrial pressure of 3 mmHg. FINDINGS  Left Ventricle: Left ventricular ejection fraction, by estimation, is 60 to 65%. The left ventricle has normal function. The left ventricle has no regional wall motion abnormalities. The left ventricular internal cavity size was normal in size. There is  no left ventricular hypertrophy. Left ventricular diastolic parameters are consistent with Grade I diastolic dysfunction (impaired relaxation). Right Ventricle: The right ventricular size is normal. No increase in right ventricular wall thickness. Right ventricular systolic function is normal. Left Atrium: Left atrial size was normal in size. Right Atrium: Right atrial size was normal in size.  Pericardium: There is no evidence of pericardial effusion. Mitral Valve: The mitral valve is normal in structure. No evidence of mitral valve regurgitation. No evidence of mitral valve stenosis. Tricuspid Valve: The tricuspid valve is normal in structure. Tricuspid valve regurgitation is trivial. No evidence of tricuspid stenosis. Aortic Valve: The aortic valve is normal in structure. Aortic valve regurgitation is not visualized. No aortic stenosis is present. Aortic valve mean gradient measures 2.0 mmHg. Aortic valve peak gradient measures 4.6 mmHg. Aortic valve area, by VTI measures 3.03 cm. Pulmonic Valve: The pulmonic valve was normal in structure. Pulmonic valve regurgitation is not visualized. No evidence of pulmonic stenosis. Aorta: The aortic root is normal in size and structure. Venous: The inferior vena cava is normal in size with greater than 50% respiratory variability, suggesting right atrial pressure of 3 mmHg. IAS/Shunts: No atrial level shunt detected by color flow Doppler.  LEFT VENTRICLE PLAX 2D LVIDd:         4.80 cm  Diastology LVIDs:         3.10 cm  LV e' medial:    7.51 cm/s LV PW:         0.90 cm  LV E/e' medial:  7.2 LV IVS:        0.90 cm  LV e' lateral:   9.68 cm/s LVOT diam:     2.10 cm  LV E/e' lateral: 5.6 LV SV:         48 LV SV Index:   26 LVOT Area:     3.46 cm  LEFT ATRIUM             Index LA diam:        2.90 cm 1.61 cm/m LA Vol (A2C):   25.9 ml 14.33 ml/m LA Vol (A4C):   25.7 ml 14.22 ml/m LA Biplane Vol: 27.3 ml 15.11 ml/m  AORTIC VALVE AV Area (Vmax):    3.15 cm AV Area (Vmean):   3.05 cm AV Area (VTI):  3.03 cm AV Vmax:           107.00 cm/s AV Vmean:          70.600 cm/s AV VTI:            0.158 m AV Peak Grad:      4.6 mmHg AV Mean Grad:      2.0 mmHg LVOT Vmax:         97.30 cm/s LVOT Vmean:        62.100 cm/s LVOT VTI:          0.138 m LVOT/AV VTI ratio: 0.87  AORTA Ao Root diam: 2.70 cm MITRAL VALVE MV Area (PHT): 3.89 cm    SHUNTS MV Decel Time: 195 msec     Systemic VTI:  0.14 m MV E velocity: 54.40 cm/s  Systemic Diam: 2.10 cm MV A velocity: 67.70 cm/s MV E/A ratio:  0.80 Donato Schultz MD Electronically signed by Donato Schultz MD Signature Date/Time: 01/20/2021/6:17:44 AM    Final         Scheduled Meds:  sodium chloride   Intravenous Once   folic acid  1 mg Oral Daily   LORazepam  0-4 mg Intravenous Q4H   Followed by   LORazepam  0-4 mg Intravenous Q8H   multivitamin with minerals  1 tablet Oral Daily   [START ON 01/23/2021] pantoprazole  40 mg Intravenous Q12H   thiamine  100 mg Oral Daily   Or   thiamine  100 mg Intravenous Daily   Continuous Infusions:  sodium chloride     sodium chloride 75 mL/hr at 01/20/21 2017   octreotide  (SANDOSTATIN)    IV infusion 50 mcg/hr (01/21/21 0738)   pantoprazole 8 mg/hr (01/21/21 0217)     LOS: 2 days      Huey Bienenstock, MD [facility]   To contact the attending provider between 7A-7P or the covering provider during after hours 7P-7A, please log into the web site [email or web address] and access using universal [place] password for that web site. If you do not have the password, please call the hospital operator.  01/21/2021, 12:17 PM

## 2021-01-21 NOTE — Progress Notes (Addendum)
Patient ID: Richard Andrews, male   DOB: 12/23/1960, 60 y.o.   MRN: [id number]   Attending physician's note   I have taken an interval history, reviewed the chart and examined the patient. I agree with the Advanced Practitioner's note, impression, and recommendations as outlined.   DTs yesterday and progressed through the day, requiring escalating doses of Ativan.  CCM was consulted and recommended holding off on Precedex at this time.  EGD scheduled yesterday was canceled due to elevated anesthesia risks with active DTs.  Otherwise no overt bleeding and H/H stable 8.4/2:24 unit PRBC transfusion earlier on admission.  - Neurology evaluated patient earlier today and EEG completed  - Continue serial H/H checks with additional blood products as needed per protocol - Continue observation for overt bleeding - Continue IV PPI - Continuing octreotide for now - Plan for EGD when clinically stable - Continue thiamine  Vito Cirigliano, DO, FACG [phone] office           Progress Note   Subjective   Day # 3  CC; anemia, melena, fall at home  Had planned for EGD yesterday however due to progressive DTs decision was made by anesthesia not to sedate  Possible alcohol withdrawal seizure early this a.m., critical care now involved-recommending continued CIWA protocol, does not require intubation/Precedex at this time  WBC 2.8/hemoglobin 8.4/macro 23.9/platelets 78-stable Creatinine 0.83 T bili 1.4/AST 86/ALT 52/alk phos 59 Magnesium and phosphorus within normal limits  IV Protonix Octreotide infusion  Wife at bedside this morning, patient remains frequently agitated.  No vomiting, no report of melena or hematochezia. Patient's wife says that he has never had withdrawal this bad before  EEG pending   Objective   Vital signs in last 24 hours: Temp:  [97.5 F (36.4 C)-98.2 F (36.8 C)] 97.7 F (36.5 C) (08/11 0339) Pulse Rate:  [51-88] 60 (08/11 0749) Resp:  [12-21] 21 (08/11  0749) BP: (99-127)/(66-98) 122/93 (08/11 0749) SpO2:  [94 %-100 %] 100 % (08/11 0749) Last BM Date: 01/19/21 General:    Older white male confused, intermittently somnolent and agitated Heart:  Regular rate and rhythm; no murmurs Lungs: Respirations even and unlabored, lungs CTA bilaterally Abdomen:  Soft, nontender and nondistended. Normal bowel sounds. Extremities:  Without edema. Neurologic: Intermittent somnolence that agitation, mumbling Psych: Confused  Intake/Output from previous day: 08/10 0701 - 08/11 0700 In: 4783.4 [P.O.:30; I.V.:4096.4; IV Piggyback:657] Out: 900 [Urine:900] Intake/Output this shift: No intake/output data recorded.  Lab Results: Recent Labs    01/20/21 1207 01/20/21 1709 01/21/21 0621  WBC 4.6 3.6* 2.8*  HGB 8.9* 8.0* 8.4*  HCT 25.8* 23.2* 23.9*  PLT 84* 73* 78*   BMET Recent Labs    01/20/21 0545 01/20/21 1207 01/21/21 0621  NA 135 138 135  K 3.2* 3.8 4.4  CL 107 109 106  CO2 22 21* 23  GLUCOSE 103* 97 127*  BUN 24* 22 13  CREATININE 0.91 0.98 0.83  CALCIUM 8.0* 8.2* 8.3*   LFT Recent Labs    01/21/21 0621  PROT 5.4*  ALBUMIN 3.0*  AST 86*  ALT 52*  ALKPHOS 59  BILITOT 1.4*   PT/INR Recent Labs    01/19/21 0920  LABPROT 16.8*  INR 1.4*    Studies/Results: CT Head Wo Contrast  Result Date: 01/19/2021 CLINICAL DATA:  Head trauma, abnormal mental status (Age 19-64y). Facial trauma. Additional history provided: Fall this morning, abrasion to nose, slurred speech. EXAM: CT HEAD WITHOUT CONTRAST CT MAXILLOFACIAL WITHOUT CONTRAST TECHNIQUE: Multidetector   CT imaging of the head and maxillofacial structures were performed using the standard protocol without intravenous contrast. Multiplanar CT image reconstructions of the maxillofacial structures were also generated. COMPARISON:  Head CT 01/11/2021. FINDINGS: CT HEAD FINDINGS Brain: Mild generalized cerebral atrophy. Mild patchy and ill-defined hypoattenuation within the cerebral  white matter, nonspecific but compatible with chronic small vessel ischemic disease. Redemonstrated left middle cranial fossa arachnoid cyst, anterior to the left temporal lobe, measuring 4.4 x 3.3 cm in transaxial dimensions. There is no acute intracranial hemorrhage. No demarcated cortical infarct. No extra-axial fluid collection. No evidence of an intracranial mass. No midline shift. Vascular: No hyperdense vessel.  Atherosclerotic calcifications. Skull: Normal. Negative for fracture or focal lesion. CT MAXILLOFACIAL FINDINGS Osseous: No acute maxillofacial fracture is identified. Orbits: No acute finding within the orbits. The globes are normal in size and contour. The extraocular muscles and optic nerve sheath complexes are symmetric and unremarkable. Sinuses: Large left maxillary sinus mucous retention cyst. Small mucous retention cyst within a posterior left ethmoid air cell. Mucosal thickening within an anterior right ethmoid air cell. Soft tissues: No significant maxillofacial soft tissue swelling is appreciable by CT. IMPRESSION: CT head: 1. No evidence of acute intracranial abnormality. 2. Mild generalized cerebral atrophy and cerebral white matter chronic small vessel ischemic disease. 3. Redemonstrated left middle cranial fossa arachnoid cyst. CT maxillofacial: 1. No evidence of acute maxillofacial fracture. 2. Paranasal sinus disease as described. Electronically Signed   By: Kellie Simmering DO   On: 01/19/2021 10:12   CT CHEST ABDOMEN PELVIS W CONTRAST  Result Date: 01/19/2021 CLINICAL DATA:  60 year old male status post fall this morning. Acute L1 compression fracture secondary to a fall on 01/09/21-01/11/21. EXAM: CT CHEST, ABDOMEN, AND PELVIS WITH CONTRAST TECHNIQUE: Multidetector CT imaging of the chest, abdomen and pelvis was performed following the standard protocol during bolus administration of intravenous contrast. CONTRAST:  34m OMNIPAQUE IOHEXOL 300 MG/ML  SOLN COMPARISON:  Lumbar spine CT  01/11/2021. FINDINGS: CT CHEST FINDINGS Cardiovascular: Normal thoracic aorta. Calcified coronary artery atherosclerosis on series 2, image 33. No cardiomegaly or pericardial effusion. Other central mediastinal vascular structures appear intact. Mediastinum/Nodes: Negative, no mediastinal hematoma or lymphadenopathy. Lungs/Pleura: Lung volumes appear normal. Major airways are patent. Minor calcified scarring in both lung apices. Minimal dependent atelectasis or scarring in both lungs, which otherwise appear clear. Musculoskeletal: There is moderate to severe anterior wedge compression fracture of the T6 vertebral body. Minimal retropulsion of the posteroinferior endplate. T6 pedicles and posterior elements appear intact. This fracture is age indeterminate, although absence of paraspinal soft tissue swelling and lucent fracture line suggest this is chronic. Other thoracic vertebrae are intact. Intact sternum. Visible shoulder osseous structures are intact. Chronic right lateral (6 through 8) rib fractures. Chronic right posterior 10th and 11th rib fractures. Chronic left 9th through 11th rib fractures. No acute rib fracture identified. CT ABDOMEN PELVIS FINDINGS Hepatobiliary: Hepatic steatosis. Otherwise negative liver and gallbladder. Pancreas: Negative. Spleen: Negative; subtle low-density along some of the splenic capsule (series 5, image 36 and series 2, image 56) could be trace subcapsular hematoma. This appears unchanged on the delayed images. But otherwise the spleen appears intact. No perisplenic fluid. Incidental splenule (normal variant). Adrenals/Urinary Tract: Normal adrenal glands. Nonobstructed kidneys appear intact. Bilateral nephrolithiasis measuring 5-6 mm. Symmetric renal enhancement and contrast excretion. Normal ureters. Incidental pelvic phleboliths. Unremarkable bladder. Stomach/Bowel: Negative large bowel aside from sigmoid diverticulosis. Normal appendix on series 2, image 100. Negative  terminal ileum. No dilated small bowel. Negative stomach  and duodenum. No free air, free fluid. Vascular/Lymphatic: Mild Calcified aortic atherosclerosis. Major arterial structures appear patent and intact. Portal venous system is patent. No lymphadenopathy. Reproductive: Negative. Other: No pelvic free fluid. Musculoskeletal: Comminuted central L1 vertebral body fracture with progressed and now severe central loss of vertebral body height (series 6, image 64). Minimal retropulsion. L1 pedicles and posterior elements remain intact. Lucent fracture planes remain visible on series 4, image 158. But there is no significant paraspinal soft tissue swelling. Other lumbar levels appear stable and intact. Sacrum and SI joints appear stable and intact. Pelvis and proximal femurs appear intact. IMPRESSION: 1. Progressed L1 compression fracture since 01/11/2021 now with severe central loss of vertebral body height. Minimal retropulsion, and no other complicating features. 2. Age indeterminate but probably chronic wedge compression fracture of T6. Multiple bilateral chronic rib fractures. 3. Possible trace Subcapsular Hematoma of the Spleen, which otherwise appears intact. No hemoperitoneum. 4. No other acute traumatic injury identified in the chest, abdomen, or pelvis. 5. Hepatic steatosis. Bilateral nephrolithiasis. Sigmoid diverticulosis. Calcified coronary artery atherosclerosis. Electronically Signed   By: H  Hall M.D.   On: 01/19/2021 11:38   ECHOCARDIOGRAM COMPLETE  Result Date: 01/20/2021    ECHOCARDIOGRAM REPORT   Patient Name:   Richard Andrews Date of Exam: 01/19/2021 Medical Rec #:  [id number]     Height:       72.0 in Accession #:    [id number]    Weight:       135.7 lb Date of Birth:  03/18/1961     BSA:          1.807 m Patient Age:    60 years      BP:           123/82 mmHg Patient Gender: M             HR:           86 bpm. Exam Location:  Inpatient Procedure: 2D Echo, Color Doppler and Cardiac Doppler  Indications:    CHF  History:        Patient has no prior history of Echocardiogram examinations.                 Risk Factors:Current Smoker. ETOH abuse suffered two falls.                 Broke vertebrae.  Sonographer:    Rachel Lane RDCS Referring Phys: [id number] CURTIS J WOODS IMPRESSIONS  1. Left ventricular ejection fraction, by estimation, is 60 to 65%. The left ventricle has normal function. The left ventricle has no regional wall motion abnormalities. Left ventricular diastolic parameters are consistent with Grade I diastolic dysfunction (impaired relaxation).  2. Right ventricular systolic function is normal. The right ventricular size is normal.  3. The mitral valve is normal in structure. No evidence of mitral valve regurgitation. No evidence of mitral stenosis.  4. The aortic valve is normal in structure. Aortic valve regurgitation is not visualized. No aortic stenosis is present.  5. The inferior vena cava is normal in size with greater than 50% respiratory variability, suggesting right atrial pressure of 3 mmHg. FINDINGS  Left Ventricle: Left ventricular ejection fraction, by estimation, is 60 to 65%. The left ventricle has normal function. The left ventricle has no regional wall motion abnormalities. The left ventricular internal cavity size was normal in size. There is  no left ventricular hypertrophy. Left ventricular diastolic parameters are consistent with Grade I diastolic dysfunction (impaired relaxation).   Right Ventricle: The right ventricular size is normal. No increase in right ventricular wall thickness. Right ventricular systolic function is normal. Left Atrium: Left atrial size was normal in size. Right Atrium: Right atrial size was normal in size. Pericardium: There is no evidence of pericardial effusion. Mitral Valve: The mitral valve is normal in structure. No evidence of mitral valve regurgitation. No evidence of mitral valve stenosis. Tricuspid Valve: The tricuspid valve is normal in  structure. Tricuspid valve regurgitation is trivial. No evidence of tricuspid stenosis. Aortic Valve: The aortic valve is normal in structure. Aortic valve regurgitation is not visualized. No aortic stenosis is present. Aortic valve mean gradient measures 2.0 mmHg. Aortic valve peak gradient measures 4.6 mmHg. Aortic valve area, by VTI measures 3.03 cm. Pulmonic Valve: The pulmonic valve was normal in structure. Pulmonic valve regurgitation is not visualized. No evidence of pulmonic stenosis. Aorta: The aortic root is normal in size and structure. Venous: The inferior vena cava is normal in size with greater than 50% respiratory variability, suggesting right atrial pressure of 3 mmHg. IAS/Shunts: No atrial level shunt detected by color flow Doppler.  LEFT VENTRICLE PLAX 2D LVIDd:         4.80 cm  Diastology LVIDs:         3.10 cm  LV e' medial:    7.51 cm/s LV PW:         0.90 cm  LV E/e' medial:  7.2 LV IVS:        0.90 cm  LV e' lateral:   9.68 cm/s LVOT diam:     2.10 cm  LV E/e' lateral: 5.6 LV SV:         48 LV SV Index:   26 LVOT Area:     3.46 cm  LEFT ATRIUM             Index LA diam:        2.90 cm 1.61 cm/m LA Vol (A2C):   25.9 ml 14.33 ml/m LA Vol (A4C):   25.7 ml 14.22 ml/m LA Biplane Vol: 27.3 ml 15.11 ml/m  AORTIC VALVE AV Area (Vmax):    3.15 cm AV Area (Vmean):   3.05 cm AV Area (VTI):     3.03 cm AV Vmax:           107.00 cm/s AV Vmean:          70.600 cm/s AV VTI:            0.158 m AV Peak Grad:      4.6 mmHg AV Mean Grad:      2.0 mmHg LVOT Vmax:         97.30 cm/s LVOT Vmean:        62.100 cm/s LVOT VTI:          0.138 m LVOT/AV VTI ratio: 0.87  AORTA Ao Root diam: 2.70 cm MITRAL VALVE MV Area (PHT): 3.89 cm    SHUNTS MV Decel Time: 195 msec    Systemic VTI:  0.14 m MV E velocity: 54.40 cm/s  Systemic Diam: 2.10 cm MV A velocity: 67.70 cm/s MV E/A ratio:  0.80 Candee Furbish MD Electronically signed by Candee Furbish MD Signature Date/Time: 01/20/2021/6:17:44 AM    Final    CT Maxillofacial  WO CM  Result Date: 01/19/2021 CLINICAL DATA:  Head trauma, abnormal mental status (Age 22-64y). Facial trauma. Additional history provided: Fall this morning, abrasion to nose, slurred speech. EXAM: CT HEAD WITHOUT CONTRAST CT MAXILLOFACIAL WITHOUT CONTRAST  TECHNIQUE: Multidetector CT imaging of the head and maxillofacial structures were performed using the standard protocol without intravenous contrast. Multiplanar CT image reconstructions of the maxillofacial structures were also generated. COMPARISON:  Head CT 01/11/2021. FINDINGS: CT HEAD FINDINGS Brain: Mild generalized cerebral atrophy. Mild patchy and ill-defined hypoattenuation within the cerebral white matter, nonspecific but compatible with chronic small vessel ischemic disease. Redemonstrated left middle cranial fossa arachnoid cyst, anterior to the left temporal lobe, measuring 4.4 x 3.3 cm in transaxial dimensions. There is no acute intracranial hemorrhage. No demarcated cortical infarct. No extra-axial fluid collection. No evidence of an intracranial mass. No midline shift. Vascular: No hyperdense vessel.  Atherosclerotic calcifications. Skull: Normal. Negative for fracture or focal lesion. CT MAXILLOFACIAL FINDINGS Osseous: No acute maxillofacial fracture is identified. Orbits: No acute finding within the orbits. The globes are normal in size and contour. The extraocular muscles and optic nerve sheath complexes are symmetric and unremarkable. Sinuses: Large left maxillary sinus mucous retention cyst. Small mucous retention cyst within a posterior left ethmoid air cell. Mucosal thickening within an anterior right ethmoid air cell. Soft tissues: No significant maxillofacial soft tissue swelling is appreciable by CT. IMPRESSION: CT head: 1. No evidence of acute intracranial abnormality. 2. Mild generalized cerebral atrophy and cerebral white matter chronic small vessel ischemic disease. 3. Redemonstrated left middle cranial fossa arachnoid cyst. CT  maxillofacial: 1. No evidence of acute maxillofacial fracture. 2. Paranasal sinus disease as described. Electronically Signed   By: Kyle  Golden DO   On: 01/19/2021 10:12       Assessment / Plan:   #1  60-year-old white male admitted after fall at home, then found in the ER to have melena and anemia with at least 5 g drop in hemoglobin over the past month  Patient has not had any prior history of GI bleeding no prior endoscopic evaluation. Possible acute alcohol induced gastropathy or peptic ulcer disease, rule out ulcerative esophagitis, doubt variceal but may have portal gastropathy.  EGD canceled yesterday as anesthesia did not feel safe to sedate as he is in active DTs.  Hemoglobin has been stable Covering with prophylactic octreotide and IV PPI  #2 chronic EtOH abuse-now in active DTs Possible brief seizure this morning-critical care has been consulted and did not feel that Precedex was indicated at present EEG pending  #3 no definite cirrhosis or evidence for portal hypertension by CT, however does have thrombocytopenia mildly elevated bilirubin and mild coagulopathy suggestive of synthetic dysfunction Probable component of EtOH hepatitis  #4 L1 compression fracture and probable chronic T6 compression fracture   Plan; continue serial hemoglobins, transfuse as indicated Continue IV PPI infusion and IV octreotide infusion We will plan EGD when clinically stable to be sedated    Principal Problem:   GI bleed Active Problems:   Essential hypertension   Increased ammonia level   Thrombocytopenia (HCC)   Macrocytic anemia   QT prolongation   Acute blood loss anemia   Alcohol withdrawal seizure with complication (HCC)     LOS: 2 days   Amy Esterwood  PA-C8/04/2021, 9:38 AM   

## 2021-01-21 NOTE — TOC Initial Note (Signed)
Transition of Care [facility]) - Initial/Assessment Note    Patient Details  Name: Richard Andrews MRN: [id number] Date of Birth: [birth year]  [facility] of [facility]) CM/SW Contact:    Lockie Pares, RN Phone Number: 01/21/2021, 10:27 AM  Clinical Narrative:                  Patient admitted for mechanical fall, DT's drinks every day. Receiving high doses of Ativan, Critical Care consulted. They feel at this time he does not warrant a higher level of care. Has wife at home. Will need more time to work out the withdrawal.   Expected Discharge Plan: Home/Self Care Barriers to Discharge: Continued Medical Work up   Patient Goals and CMS Choice        Expected Discharge Plan and Services Expected Discharge Plan: Home/Self Care       Living arrangements for the past 2 months: Single Family Home                                      Prior Living Arrangements/Services Living arrangements for the past 2 months: Single Family Home Lives with:: Spouse Patient language and need for interpreter reviewed:: Yes        Need for Family Participation in Patient Care: Yes (Comment) Care giver support system in place?: Yes (comment)   Criminal Activity/Legal Involvement Pertinent to Current Situation/Hospitalization: No - Comment as needed  Activities of Daily Living Home Assistive Devices/Equipment: None ADL Screening (condition at time of admission) Patient's cognitive ability adequate to safely complete daily activities?: Yes Is the patient deaf or have difficulty hearing?: No Does the patient have difficulty seeing, even when wearing glasses/contacts?: No Does the patient have difficulty concentrating, remembering, or making decisions?: No Patient able to express need for assistance with ADLs?: Yes Does the patient have difficulty dressing or bathing?: No Independently performs ADLs?: No Communication: Independent Dressing (OT): Independent Grooming: Independent Feeding:  Independent Bathing: Needs assistance Is this a change from baseline?: Pre-admission baseline Toileting: Needs assistance Is this a change from baseline?: Pre-admission baseline In/Out Bed: Needs assistance Is this a change from baseline?: Pre-admission baseline Walks in Home: Needs assistance Is this a change from baseline?: Pre-admission baseline Does the patient have difficulty walking or climbing stairs?: Yes Weakness of Legs: Both Weakness of Arms/Hands: Both  Permission Sought/Granted                  Emotional Assessment   Attitude/Demeanor/Rapport: Lethargic Affect (typically observed): Agitated Orientation: : Fluctuating Orientation (Suspected and/or reported Sundowners) Alcohol / Substance Use: Not Applicable Psych Involvement: No (comment)  Admission diagnosis:  Hypomagnesemia [E83.42] Acute blood loss anemia [D62] GI bleed [K92.2] Upper GI bleed [K92.2] Chest wall contusion [S20.219A] Alcohol withdrawal syndrome without complication (HCC) [F10.230] Patient Active Problem List   Diagnosis Date Noted   Acute blood loss anemia    Alcohol withdrawal seizure with complication (HCC)    GI bleed 01/19/2021   QT prolongation 01/19/2021   Electrolyte imbalance 02/06/2020   Macrocytic anemia 02/06/2020   Acute pain of right wrist 02/06/2020   Encounter for orthopedic follow-up care 01/28/2020   Open Colles' fracture of right radius 01/10/2020   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 [name] [address] Phone: [phone] Fax: [phone]     Social Determinants of Health  (SDOH) Interventions    Readmission Risk Interventions No flowsheet data found.

## 2021-01-22 ENCOUNTER — Inpatient Hospital Stay (HOSPITAL_COMMUNITY): Payer: BC Managed Care – PPO | Admitting: Certified Registered Nurse Anesthetist

## 2021-01-22 ENCOUNTER — Encounter (HOSPITAL_COMMUNITY): Payer: Self-pay | Admitting: Internal Medicine

## 2021-01-22 ENCOUNTER — Encounter (HOSPITAL_COMMUNITY)
Admission: EM | Disposition: A | Discharge status: Home / Self Care | Payer: Self-pay | Source: Home / Self Care | Attending: Internal Medicine

## 2021-01-22 DIAGNOSIS — F10239 Alcohol dependence with withdrawal, unspecified: Secondary | ICD-10-CM

## 2021-01-22 DIAGNOSIS — K2970 Gastritis, unspecified, without bleeding: Secondary | ICD-10-CM

## 2021-01-22 DIAGNOSIS — K259 Gastric ulcer, unspecified as acute or chronic, without hemorrhage or perforation: Secondary | ICD-10-CM

## 2021-01-22 DIAGNOSIS — F10230 Alcohol dependence with withdrawal, uncomplicated: Principal | ICD-10-CM

## 2021-01-22 DIAGNOSIS — R569 Unspecified convulsions: Secondary | ICD-10-CM | POA: Diagnosis not present

## 2021-01-22 DIAGNOSIS — F10930 Alcohol use, unspecified with withdrawal, uncomplicated: Secondary | ICD-10-CM

## 2021-01-22 DIAGNOSIS — K2990 Gastroduodenitis, unspecified, without bleeding: Secondary | ICD-10-CM

## 2021-01-22 DIAGNOSIS — K269 Duodenal ulcer, unspecified as acute or chronic, without hemorrhage or perforation: Secondary | ICD-10-CM

## 2021-01-22 HISTORY — PX: BIOPSY: SHX5522

## 2021-01-22 HISTORY — PX: ESOPHAGOGASTRODUODENOSCOPY (EGD) WITH PROPOFOL: SHX5813

## 2021-01-22 LAB — COMPREHENSIVE METABOLIC PANEL
ALT: 46 U/L — ABNORMAL HIGH (ref 0–44)
AST: 62 U/L — ABNORMAL HIGH (ref 15–41)
Albumin: 2.9 g/dL — ABNORMAL LOW (ref 3.5–5.0)
Alkaline Phosphatase: 74 U/L (ref 38–126)
Anion gap: 9 (ref 5–15)
BUN: 8 mg/dL (ref 8–23)
CO2: 27 mmol/L (ref 22–32)
Calcium: 8.6 mg/dL — ABNORMAL LOW (ref 8.9–10.3)
Chloride: 100 mmol/L (ref 98–111)
Creatinine, Ser: 0.92 mg/dL (ref 0.61–1.24)
GFR, Estimated: >60 mL/min (ref >60)
Glucose, Bld: 123 mg/dL — ABNORMAL HIGH (ref 70–99)
Potassium: 3.5 mmol/L (ref 3.5–5.1)
Sodium: 136 mmol/L (ref 135–145)
Total Bilirubin: 1.7 mg/dL — ABNORMAL HIGH (ref 0.3–1.2)
Total Protein: 5.7 g/dL — ABNORMAL LOW (ref 6.5–8.1)

## 2021-01-22 LAB — CBC WITH DIFFERENTIAL/PLATELET
Abs Immature Granulocytes: 0.04 10*3/uL (ref 0.00–0.07)
Basophils Absolute: 0 10*3/uL (ref 0.0–0.1)
Basophils Relative: 0 %
Eosinophils Absolute: 0.1 10*3/uL (ref 0.0–0.5)
Eosinophils Relative: 3 %
HCT: 25.3 % — ABNORMAL LOW (ref 39.0–52.0)
Hemoglobin: 8.9 g/dL — ABNORMAL LOW (ref 13.0–17.0)
Immature Granulocytes: 1 %
Lymphocytes Relative: 19 %
Lymphs Abs: 0.6 10*3/uL — ABNORMAL LOW (ref 0.7–4.0)
MCH: 35.5 pg — ABNORMAL HIGH (ref 26.0–34.0)
MCHC: 35.2 g/dL (ref 30.0–36.0)
MCV: 100.8 fL — ABNORMAL HIGH (ref 80.0–100.0)
Monocytes Absolute: 0.7 10*3/uL (ref 0.1–1.0)
Monocytes Relative: 23 %
Neutro Abs: 1.7 10*3/uL (ref 1.7–7.7)
Neutrophils Relative %: 54 %
Platelets: 91 10*3/uL — ABNORMAL LOW (ref 150–400)
RBC: 2.51 MIL/uL — ABNORMAL LOW (ref 4.22–5.81)
RDW: 14.2 % (ref 11.5–15.5)
WBC: 3.2 10*3/uL — ABNORMAL LOW (ref 4.0–10.5)
nRBC: 0 % (ref 0.0–0.2)

## 2021-01-22 LAB — CBC
HCT: 26.9 % — ABNORMAL LOW (ref 39.0–52.0)
Hemoglobin: 9.3 g/dL — ABNORMAL LOW (ref 13.0–17.0)
MCH: 35.6 pg — ABNORMAL HIGH (ref 26.0–34.0)
MCHC: 34.6 g/dL (ref 30.0–36.0)
MCV: 103.1 fL — ABNORMAL HIGH (ref 80.0–100.0)
Platelets: 93 10*3/uL — ABNORMAL LOW (ref 150–400)
RBC: 2.61 MIL/uL — ABNORMAL LOW (ref 4.22–5.81)
RDW: 14.3 % (ref 11.5–15.5)
WBC: 3.5 10*3/uL — ABNORMAL LOW (ref 4.0–10.5)
nRBC: 0 % (ref 0.0–0.2)

## 2021-01-22 LAB — PHOSPHORUS: Phosphorus: 4.6 mg/dL (ref 2.5–4.6)

## 2021-01-22 LAB — MAGNESIUM: Magnesium: 1.6 mg/dL — ABNORMAL LOW (ref 1.7–2.4)

## 2021-01-22 SURGERY — ESOPHAGOGASTRODUODENOSCOPY (EGD) WITH PROPOFOL
Anesthesia: Monitor Anesthesia Care

## 2021-01-22 MED ORDER — SUCRALFATE 1 GM/10ML PO SUSP
1.0000 g | Freq: Three times a day (TID) | ORAL | Status: DC
  Administered 2021-01-22 – 2021-01-25 (×12): 1 g via ORAL
  Filled 2021-01-22 (×12): qty 10

## 2021-01-22 MED ORDER — MAGNESIUM SULFATE 2 GM/50ML IV SOLN
2.0000 g | Freq: Once | INTRAVENOUS | Status: AC
  Administered 2021-01-22: 2 g via INTRAVENOUS
  Filled 2021-01-22: qty 50

## 2021-01-22 MED ORDER — PROPOFOL 500 MG/50ML IV EMUL
INTRAVENOUS | Status: DC | PRN
  Administered 2021-01-22: 75 ug/kg/min via INTRAVENOUS

## 2021-01-22 MED ORDER — PANTOPRAZOLE SODIUM 40 MG PO TBEC
40.0000 mg | DELAYED_RELEASE_TABLET | Freq: Two times a day (BID) | ORAL | Status: DC
  Administered 2021-01-22 – 2021-01-25 (×7): 40 mg via ORAL
  Filled 2021-01-22 (×7): qty 1

## 2021-01-22 MED ORDER — SODIUM CHLORIDE 0.9 % IV SOLN: INTRAVENOUS | Status: DC | PRN

## 2021-01-22 MED ORDER — PROPOFOL 10 MG/ML IV BOLUS
INTRAVENOUS | Status: DC | PRN
  Administered 2021-01-22: 50 mg via INTRAVENOUS

## 2021-01-22 SURGICAL SUPPLY — 15 items

## 2021-01-22 NOTE — Progress Notes (Signed)
PROGRESS NOTE    Richard Andrews  NID:[id number] DOB: 19-May-1961 DOA: 01/19/2021 PCP: [name], NP  Chief Complaint  Patient presents with   Fall    Brief Narrative:   60 yo WM PMHx EtOH abuse, Increased ammonia level, thrombocytopenia, macrocytic anemia   Patient was admitted with a fall, recent ED visit with diagnosis of L1 compression fracture, work-up in ED significant for anemia, melena, Hemoccult positive stools, prior PRBC transfusion, as well patient developed alcohol withdrawals.  Patient initially unstable for endoscopy given his severe withdrawals.  Withdrawal has improved, he went for EGD 8/12 which was significant for Multiple gastric ulcers and 2 duodenal ulcers. All clean based. Biopsied. Will plan on for continued PO PPI and Carafate. Will need CBC 7-10 days after hospital d/c.   Assessment & Plan:   Principal Problem:   GI bleed Active Problems:   Essential hypertension   Increased ammonia level   Thrombocytopenia (HCC)   Macrocytic anemia   QT prolongation   Acute blood loss anemia   Alcohol withdrawal seizure with complication (HCC)   Alcohol withdrawal syndrome without complication (HCC)   Gastritis and gastroduodenitis   Multiple gastric ulcers   Duodenal ulcer  Acute blood loss anemia due to GI bleed due to gastric/duodenal ulcers -Patient presents with acute blood loss anemia, due to GI bleed, unclear upper versus lower, but he does present with melena, hemoglobin was 14.410 days ago, currently 6.7 on admission, transfused 2 units PRBC, hemoglobin remained stable. -EGD was canceled initially due to severe withdrawals, mentation has improved and withdrawal has improved today, went for endoscopy today which was significant for Multiple gastric ulcers and 2 duodenal ulcers. All clean based. Biopsied.  -Sinew with PPI and Carafate. -On clear liquid diet, advance as tolerated. -Monitor H&H closely -Received 5 mg of IV vitamin K per GI due to  coagulopathy  Thrombocytopenia -Most likely secondary to alcohol abuse, -Continue to monitor, no indication for transfusion  EtOH abuse with active DT's -Currently with active DTs, he is on stepdown CIWA protocol, -PCCM input appreciated, so far no indication for Precedex drip. -Continue with thiamine and folic acid. -Patient with an episode of tremors, questionable for seizures, but EEG this morning with no acute findings -Withdrawal appears to be improving, but still with some tremors, continue with CIWA protocol.  Essential HTN -Currently controlled without medication   QT prolongation - Hold all QT prolongation medication   Hypomagnesmia -Magnesium goal> 2    DVT prophylaxis: SCD, no chemical prophylaxis due to coagulopathy, GI bleed  and thrombocytopenia Code Status: Full Family Communication: D/W wife by phone 8/11 Disposition:   Status is: Inpatient  Remains inpatient appropriate because:IV treatments appropriate due to intensity of illness or inability to take PO  Dispo: The patient is from: Home              Anticipated d/c is to: Home              Patient currently is not medically stable to d/c.   Difficult to place patient No       Consultants:  GI PCCM   Subjective:  Is more awake and appropriate today, but still easily distracted, no significant events overnight. Objective: Vitals:   01/22/21 1216 01/22/21 1303 01/22/21 1313 01/22/21 1323  BP: (!) 154/75 102/66 105/73 131/77  Pulse: 67 64 (!) 59 61  Resp: 13 16 15 18   Temp:  98.4 F (36.9 C)    TempSrc:  Temporal  SpO2: 99% 100% 98% 100%  Weight: 61.6 kg     Height: 6' (1.829 m)      No intake or output data in the 24 hours ending 01/22/21 1344  Filed Weights   01/19/21 0832 01/19/21 1515 01/22/21 1216  Weight: 68.2 kg 61.6 kg 61.6 kg    Examination:   Awake Alert, is more appropriate today, oriented x3, but is easily distracted, remains with some tremors.   Symmetrical Chest  wall movement, Good air movement bilaterally, CTAB RRR,No Gallops,Rubs or new Murmurs, No Parasternal Heave +ve B.Sounds, Abd Soft, No tenderness, No rebound - guarding or rigidity. No Cyanosis, Clubbing or edema, No new Rash or bruise       Data Reviewed: I have personally reviewed following labs and imaging studies  CBC: Recent Labs  Lab 01/19/21 1323 01/19/21 1756 01/20/21 0545 01/20/21 1207 01/20/21 1709 01/21/21 0621 01/21/21 1715 01/22/21 0636  WBC 7.6 7.8 3.9* 4.6 3.6* 2.8* 3.4* 3.2*  NEUTROABS 5.4 5.7 2.7  --   --  1.7  --  1.7  HGB 6.7* 7.1* 8.0* 8.9* 8.0* 8.4* 9.0* 8.9*  HCT 19.6* 20.3* 23.6* 25.8* 23.2* 23.9* 25.8* 25.3*  MCV 105.4* 107.4* 102.6* 103.2* 102.7* 101.7* 104.0* 100.8*  PLT 100* DCLMP 69* 84* 73* 78* 86* 91*    Basic Metabolic Panel: Recent Labs  Lab 01/19/21 1756 01/20/21 0545 01/20/21 1207 01/21/21 0621 01/22/21 0636  NA 135 135 138 135 136  K 3.5 3.2* 3.8 4.4 3.5  CL 105 107 109 106 100  CO2 22 22 21* 23 27  GLUCOSE 125* 103* 97 127* 123*  BUN 32* 24* 22 13 8   CREATININE 0.90 0.91 0.98 0.83 0.92  CALCIUM 8.6* 8.0* 8.2* 8.3* 8.6*  MG 1.8 1.7 2.0 1.8 1.6*  PHOS 2.3* 2.4* 2.4* 3.3 4.6    GFR: Estimated Creatinine Clearance: 73.5 mL/min (by C-G formula based on SCr of 0.92 mg/dL).  Liver Function Tests: Recent Labs  Lab 01/19/21 1756 01/20/21 0545 01/20/21 1207 01/21/21 0621 01/22/21 0636  AST 44* 48* 72* 86* 62*  ALT 37 35 42 52* 46*  ALKPHOS 60 53 60 59 74  BILITOT 1.6* 1.3* 1.3* 1.4* 1.7*  PROT 6.3* 5.5* 6.0* 5.4* 5.7*  ALBUMIN 3.5 3.0* 3.2* 3.0* 2.9*    CBG: No results for input(s): GLUCAP in the last 168 hours.   Recent Results (from the past 240 hour(s))  Resp Panel by RT-PCR (Flu A&B, Covid) Nasopharyngeal Swab     Status: None   Collection Time: 01/19/21  9:03 AM   Specimen: Nasopharyngeal Swab; Nasopharyngeal(NP) swabs in vial transport medium  Result Value Ref Range Status   SARS Coronavirus 2 by RT PCR  NEGATIVE NEGATIVE Final    Comment: (NOTE) SARS-CoV-2 target nucleic acids are NOT DETECTED.  The SARS-CoV-2 RNA is generally detectable in upper respiratory specimens during the acute phase of infection. The lowest concentration of SARS-CoV-2 viral copies this assay can detect is 138 copies/mL. A negative result does not preclude SARS-Cov-2 infection and should not be used as the sole basis for treatment or other patient management decisions. A negative result may occur with  improper specimen collection/handling, submission of specimen other than nasopharyngeal swab, presence of viral mutation(s) within the areas targeted by this assay, and inadequate number of viral copies(<138 copies/mL). A negative result must be combined with clinical observations, patient history, and epidemiological information. The expected result is Negative.  Fact Sheet for Patients:  BloggerCourse.comhttps://www.fda.gov/media/152166/download  Fact Sheet for Healthcare Providers:  SeriousBroker.it  This test is no t yet approved or cleared by the [place] and  has been authorized for detection and/or diagnosis of SARS-CoV-2 by FDA under an Emergency Use Authorization (EUA). This EUA will remain  in effect (meaning this test can be used) for the duration of the COVID-19 declaration under Section 564(b)(1) of the Act, 21 U.S.C.section 360bbb-3(b)(1), unless the authorization is terminated  or revoked sooner.       Influenza A by PCR NEGATIVE NEGATIVE Final   Influenza B by PCR NEGATIVE NEGATIVE Final    Comment: (NOTE) The Xpert Xpress SARS-CoV-2/FLU/RSV plus assay is intended as an aid in the diagnosis of influenza from Nasopharyngeal swab specimens and should not be used as a sole basis for treatment. Nasal washings and aspirates are unacceptable for Xpert Xpress SARS-CoV-2/FLU/RSV testing.  Fact Sheet for Patients: BloggerCourse.com  Fact Sheet for  Healthcare Providers: SeriousBroker.it  This test is not yet approved or cleared by the [place] FDA and has been authorized for detection and/or diagnosis of SARS-CoV-2 by FDA under an Emergency Use Authorization (EUA). This EUA will remain in effect (meaning this test can be used) for the duration of the COVID-19 declaration under Section 564(b)(1) of the Act, 21 U.S.C. section 360bbb-3(b)(1), unless the authorization is terminated or revoked.  Performed at [facility], [address], [place], [name] [address]          Radiology Studies: EEG adult  Result Date: 01/29/2021 Charlsie Quest, MD     29-Jan-2021 11:25 AM Patient Name: Richard Andrews MRN: [id number] Epilepsy Attending: Charlsie Quest Referring Physician/Provider: Dr Dow Adolph Date: January 29, 2021 Duration: 26.22 mins Patient history: 59 year old male with seizure-like activity.  EEG to evaluate for seizures. Level of alertness: Awake, asleep AEDs during EEG study: Ativan Technical aspects: This EEG study was done with scalp electrodes positioned according to the 10-20 International system of electrode placement. Electrical activity was acquired at a sampling rate of 500Hz  and reviewed with a high frequency filter of 70Hz  and a low frequency filter of 1Hz . EEG data were recorded continuously and digitally stored. Description: The posterior dominant rhythm consists of 9 Hz activity of moderate voltage (25-35 uV) seen predominantly in posterior head regions, symmetric and reactive to eye opening and eye closing. Sleep was characterized by vertex waves, sleep spindles (12 to 14 Hz), maximal frontocentral region.  There is an excessive amount of 15 to 18 HzV beta activity distributed symmetrically and diffusely. Physiologic photic driving was seen during photic stimulation. Hyperventilation was not performed.   ABNORMALITY - Excessive beta, generalized IMPRESSION: This study is within  normal limits. No seizures or epileptiform discharges were seen throughout the recording. The excessive beta activity seen in the background is most likely due to the effect of benzodiazepine and is a benign EEG pattern. Priyanka        Scheduled Meds:  sodium chloride   Intravenous Once   folic acid  1 mg Oral Daily   LORazepam  0-4 mg Intravenous Q8H   multivitamin with minerals  1 tablet Oral Daily   [START ON 01/23/2021] pantoprazole  40 mg Intravenous Q12H   thiamine  100 mg Oral Daily   Or   thiamine  100 mg Intravenous Daily   Continuous Infusions:  sodium chloride     sodium chloride 75 mL/hr at 01/29/2021 2332   octreotide  (SANDOSTATIN)    IV infusion 50 mcg/hr (01/22/21 1239)   pantoprazole 8 mg/hr (01/22/21  2094)     LOS: 3 days      Huey Bienenstock, MD [facility]   To contact the attending provider between 7A-7P or the covering provider during after hours 7P-7A, please log into the web site [email or web address] and access using universal [place] password for that web site. If you do not have the password, please call the hospital operator.  01/22/2021, 1:44 PM

## 2021-01-22 NOTE — H&P (View-Only) (Signed)
Patient ID: Richard Andrews, male   DOB: 1960/12/19, 60 y.o.   MRN: [id number]   Attending physician's note   I have taken an interval history, reviewed the chart and examined the patient. I agree with the Advanced Practitioner's note, impression, and recommendations as outlined.   Plan for EGD today for diagnostic and therapeutic intent. Wife updated on plan and provides written consent. Additional recommendations pending endoscopic findings.    [place], DO, FACG [phone] office          Progress Note   Subjective   Day # 3 CC; anemia, melena, fall at home, EtOH abuse  Labs-T bili 1.7/alk phos 74/AST 62/ALT 46 WBC 3.2/hemoglobin 8.9/hematocrit 25.3/platelets 91-stable  EEG yesterday negative for seizure activity  Wife at bedside.  Patient much calmer today, no agitation.  Wife reports he has not had any Ativan since about 1:30 AM patient is able to converse a little, voices that he is hungry, no abdominal pain.  He says he has 2 vertebral fractures..    Objective   Vital signs in last 24 hours: Temp:  [97.6 F (36.4 C)-97.8 F (36.6 C)] 97.6 F (36.4 C) (08/12 0038) Pulse Rate:  [52-80] 75 (08/12 0900) Resp:  [14-16] 14 (08/12 0744) BP: (116-163)/(79-94) 147/87 (08/12 0900) SpO2:  [91 %-100 %] 99 % (08/12 0744) Last BM Date: 01/19/21 General:    Older white male n NAD eyes open and answers a few questions appropriately no tremor no agitation Heart:  Regular rate and rhythm; no murmurs Lungs: Respirations even and unlabored, lungs CTA bilaterally Abdomen:  Soft, nontender and nondistended. Normal bowel sounds. Extremities:  Without edema. Neurologic:  Alert and oriented,  grossly normal neurologically. Psych:  Cooperative. Normal mood and affect.  Intake/Output from previous day: No intake/output data recorded. Intake/Output this shift: No intake/output data recorded.  Lab Results: Recent Labs    01/21/21 0621 01/21/21 1715 01/22/21 0636  WBC  2.8* 3.4* 3.2*  HGB 8.4* 9.0* 8.9*  HCT 23.9* 25.8* 25.3*  PLT 78* 86* 91*   BMET Recent Labs    01/20/21 1207 01/21/21 0621 01/22/21 0636  NA 138 135 136  K 3.8 4.4 3.5  CL 109 106 100  CO2 21* 23 27  GLUCOSE 97 127* 123*  BUN '22 13 8  ' CREATININE 0.98 0.83 0.92  CALCIUM 8.2* 8.3* 8.6*   LFT Recent Labs    01/22/21 0636  PROT 5.7*  ALBUMIN 2.9*  AST 62*  ALT 46*  ALKPHOS 74  BILITOT 1.7*   PT/INR No results for input(s): LABPROT, INR in the last 72 hours.  Studies/Results: EEG adult  Result Date: 01/21/2021 Lora Havens, MD     01/21/2021 11:25 AM Patient Name: Richard Andrews MRN: [id number] Epilepsy Attending: Lora Havens Referring Physician/Provider: Dr Irene Pap Date: 01/21/2021 Duration: 26.22 mins Patient history: 60 year old male with seizure-like activity.  EEG to evaluate for seizures. Level of alertness: Awake, asleep AEDs during EEG study: Ativan Technical aspects: This EEG study was done with scalp electrodes positioned according to the 10-20 International system of electrode placement. Electrical activity was acquired at a sampling rate of '500Hz'  and reviewed with a high frequency filter of '70Hz'  and a low frequency filter of '1Hz' . EEG data were recorded continuously and digitally stored. Description: The posterior dominant rhythm consists of 9 Hz activity of moderate voltage (25-35 uV) seen predominantly in posterior head regions, symmetric and reactive to eye opening and eye closing. Sleep was characterized by vertex  waves, sleep spindles (12 to 14 Hz), maximal frontocentral region.  There is an excessive amount of 15 to 18 HzV beta activity distributed symmetrically and diffusely. Physiologic photic driving was seen during photic stimulation. Hyperventilation was not performed.   ABNORMALITY - Excessive beta, generalized IMPRESSION: This study is within normal limits. No seizures or epileptiform discharges were seen throughout the recording. The excessive beta  activity seen in the background is most likely due to the effect of benzodiazepine and is a benign EEG pattern. Priyanka Barbra Sarks       Assessment / Plan:    #63 60 year old white male alcoholic admitted after a fall at home, then found in the ER to have melena and anemia with 5 g drop in hemoglobin over the past month. Patient developed overt DTs within 24 hours of hospitalization and endoscopy has been on hold.  Hemoglobin has remained stable, no ongoing active bleeding  Patient is improved today, agitation has resolved and he is coherent.  Vital signs are stable  Remains on octreotide and IV PPI infusion  #2 no definite cirrhosis by imaging however does have thrombocytopenia, elevated T bili and mild coagulopathy suggestive of some component of synthetic dysfunction  #3 L1 compression fracture and probable chronic T6 compression fracture #4 anemia felt secondary to acute blood loss/subacute blood loss  Plan; keep n.p.o. Will be scheduled for EGD with Dr. Bryan Lemma this afternoon.  Procedure was discussed in detail with the patient's wife including indications risk and benefits and she is agreeable to proceed. Continue IV PPI infusion Continue octreotide until post EGD then may be able to discontinue ending findings.       Principal Problem:   GI bleed Active Problems:   Essential hypertension   Increased ammonia level   Thrombocytopenia (HCC)   Macrocytic anemia   QT prolongation   Acute blood loss anemia   Alcohol withdrawal seizure with complication ([place])     LOS: 3 days   Amy Esterwood PA-C 01/22/2021, 9:46 AM

## 2021-01-22 NOTE — Anesthesia Procedure Notes (Signed)
Procedure Name: MAC Date/Time: 01/22/2021 12:46 PM Performed by: Lowella Dell, CRNA Pre-anesthesia Checklist: Patient identified, Emergency Drugs available, Suction available, Patient being monitored and Timeout performed Patient Re-evaluated:Patient Re-evaluated prior to induction Oxygen Delivery Method: Nasal cannula Placement Confirmation: positive ETCO2 Dental Injury: Teeth and Oropharynx as per pre-operative assessment

## 2021-01-22 NOTE — Transfer of Care (Signed)
Immediate Anesthesia Transfer of Care Note  Patient: Richard Andrews  Procedure(s) Performed: ESOPHAGOGASTRODUODENOSCOPY (EGD) WITH PROPOFOL BIOPSY  Patient Location: PACU and Endoscopy Unit  Anesthesia Type:MAC  Level of Consciousness: drowsy  Airway & Oxygen Therapy: Patient Spontanous Breathing and Patient connected to nasal cannula oxygen  Post-op Assessment: Report given to RN and Post -op Vital signs reviewed and stable  Post vital signs: Reviewed and stable  Last Vitals:  Vitals Value Taken Time  BP 102/66 01/22/21 1304  Temp    Pulse 64 01/22/21 1303  Resp 15 01/22/21 1305  SpO2 100 % 01/22/21 1303  Vitals shown include unvalidated device data.  Last Pain:  Vitals:   01/22/21 1303  TempSrc:   PainSc: Asleep      Patients Stated Pain Goal: 2 (21/22/48 2500)  Complications: No notable events documented.

## 2021-01-22 NOTE — Anesthesia Postprocedure Evaluation (Signed)
Anesthesia Post Note  Patient: Richard Andrews  Procedure(s) Performed: ESOPHAGOGASTRODUODENOSCOPY (EGD) WITH PROPOFOL BIOPSY     Patient location during evaluation: Endoscopy Anesthesia Type: MAC Level of consciousness: awake and alert Pain management: pain level controlled Vital Signs Assessment: post-procedure vital signs reviewed and stable Respiratory status: spontaneous breathing, nonlabored ventilation and respiratory function stable Cardiovascular status: stable and blood pressure returned to baseline Postop Assessment: no apparent nausea or vomiting Anesthetic complications: no   No notable events documented.  Last Vitals:  Vitals:   01/22/21 1303 01/22/21 1313  BP: 102/66 105/73  Pulse: 64 (!) 59  Resp: 16 15  Temp: 36.9 C   SpO2: 100% 98%    Last Pain:  Vitals:   01/22/21 1313  TempSrc:   PainSc: 0-No pain                 Kodie Kishi,W. EDMOND

## 2021-01-22 NOTE — Anesthesia Preprocedure Evaluation (Addendum)
Anesthesia Evaluation  Patient identified by MRN, date of birth, ID band Patient awake    Reviewed: Allergy & Precautions, H&P , NPO status , Patient's Chart, lab work & pertinent test results  Airway Mallampati: III  TM Distance: >3 FB Neck ROM: Full    Dental no notable dental hx. (+) Poor Dentition, Dental Advisory Given   Pulmonary Current Smoker and Patient abstained from smoking.,    Pulmonary exam normal breath sounds clear to auscultation       Cardiovascular hypertension, Pt. on medications  Rhythm:Regular Rate:Normal     Neuro/Psych Seizures -,  negative psych ROS   GI/Hepatic negative GI ROS, (+)     substance abuse  alcohol use,   Endo/Other  negative endocrine ROS  Renal/GU negative Renal ROS  negative genitourinary   Musculoskeletal   Abdominal   Peds  Hematology  (+) Blood dyscrasia, anemia ,   Anesthesia Other Findings   Reproductive/Obstetrics negative OB ROS                            Anesthesia Physical Anesthesia Plan  ASA: 2  Anesthesia Plan: MAC   Post-op Pain Management:    Induction: Intravenous  PONV Risk Score and Plan: 0 and Propofol infusion  Airway Management Planned: Nasal Cannula  Additional Equipment:   Intra-op Plan:   Post-operative Plan:   Informed Consent: I have reviewed the patients History and Physical, chart, labs and discussed the procedure including the risks, benefits and alternatives for the proposed anesthesia with the patient or authorized representative who has indicated his/her understanding and acceptance.     Dental advisory given  Plan Discussed with: CRNA  Anesthesia Plan Comments:         Anesthesia Quick Evaluation

## 2021-01-22 NOTE — Interval H&P Note (Signed)
History and Physical Interval Note:  01/22/2021 12:41 PM  Richard Andrews  has presented today for surgery, with the diagnosis of GI bleed.  The various methods of treatment have been discussed with the patient and family. After consideration of risks, benefits and other options for treatment, the patient has consented to  Procedure(s): ESOPHAGOGASTRODUODENOSCOPY (EGD) WITH PROPOFOL (N/A) as a surgical intervention.  The patient's history has been reviewed, patient examined, no change in status, stable for surgery.  I have reviewed the patient's chart and labs.  Questions were answered to the patient's satisfaction.     [name]

## 2021-01-22 NOTE — Progress Notes (Addendum)
Patient ID: Richard Andrews, male   DOB: 17-Aug-1960, 60 y.o.   MRN: [id number]   Attending physician's note   I have taken an interval history, reviewed the chart and examined the patient. I agree with the Advanced Practitioner's note, impression, and recommendations as outlined.   Plan for EGD today for diagnostic and therapeutic intent. Wife updated on plan and provides written consent. Additional recommendations pending endoscopic findings.    [place], DO, FACG [phone] office          Progress Note   Subjective   Day # 3 CC; anemia, melena, fall at home, EtOH abuse  Labs-T bili 1.7/alk phos 74/AST 62/ALT 46 WBC 3.2/hemoglobin 8.9/hematocrit 25.3/platelets 91-stable  EEG yesterday negative for seizure activity  Wife at bedside.  Patient much calmer today, no agitation.  Wife reports he has not had any Ativan since about 1:30 AM patient is able to converse a little, voices that he is hungry, no abdominal pain.  He says he has 2 vertebral fractures..    Objective   Vital signs in last 24 hours: Temp:  [97.6 F (36.4 C)-97.8 F (36.6 C)] 97.6 F (36.4 C) (08/12 0038) Pulse Rate:  [52-80] 75 (08/12 0900) Resp:  [14-16] 14 (08/12 0744) BP: (116-163)/(79-94) 147/87 (08/12 0900) SpO2:  [91 %-100 %] 99 % (08/12 0744) Last BM Date: 01/19/21 General:    Older white male n NAD eyes open and answers a few questions appropriately no tremor no agitation Heart:  Regular rate and rhythm; no murmurs Lungs: Respirations even and unlabored, lungs CTA bilaterally Abdomen:  Soft, nontender and nondistended. Normal bowel sounds. Extremities:  Without edema. Neurologic:  Alert and oriented,  grossly normal neurologically. Psych:  Cooperative. Normal mood and affect.  Intake/Output from previous day: No intake/output data recorded. Intake/Output this shift: No intake/output data recorded.  Lab Results: Recent Labs    01/21/21 0621 01/21/21 1715 01/22/21 0636  WBC  2.8* 3.4* 3.2*  HGB 8.4* 9.0* 8.9*  HCT 23.9* 25.8* 25.3*  PLT 78* 86* 91*   BMET Recent Labs    01/20/21 1207 01/21/21 0621 01/22/21 0636  NA 138 135 136  K 3.8 4.4 3.5  CL 109 106 100  CO2 21* 23 27  GLUCOSE 97 127* 123*  BUN '22 13 8  ' CREATININE 0.98 0.83 0.92  CALCIUM 8.2* 8.3* 8.6*   LFT Recent Labs    01/22/21 0636  PROT 5.7*  ALBUMIN 2.9*  AST 62*  ALT 46*  ALKPHOS 74  BILITOT 1.7*   PT/INR No results for input(s): LABPROT, INR in the last 72 hours.  Studies/Results: EEG adult  Result Date: 01/21/2021 Lora Havens, MD     01/21/2021 11:25 AM Patient Name: Richard Andrews MRN: [id number] Epilepsy Attending: Lora Havens Referring Physician/Provider: Dr Irene Pap Date: 01/21/2021 Duration: 26.22 mins Patient history: 60 year old male with seizure-like activity.  EEG to evaluate for seizures. Level of alertness: Awake, asleep AEDs during EEG study: Ativan Technical aspects: This EEG study was done with scalp electrodes positioned according to the 10-20 International system of electrode placement. Electrical activity was acquired at a sampling rate of '500Hz'  and reviewed with a high frequency filter of '70Hz'  and a low frequency filter of '1Hz' . EEG data were recorded continuously and digitally stored. Description: The posterior dominant rhythm consists of 9 Hz activity of moderate voltage (25-35 uV) seen predominantly in posterior head regions, symmetric and reactive to eye opening and eye closing. Sleep was characterized by vertex  waves, sleep spindles (12 to 14 Hz), maximal frontocentral region.  There is an excessive amount of 15 to 18 HzV beta activity distributed symmetrically and diffusely. Physiologic photic driving was seen during photic stimulation. Hyperventilation was not performed.   ABNORMALITY - Excessive beta, generalized IMPRESSION: This study is within normal limits. No seizures or epileptiform discharges were seen throughout the recording. The excessive beta  activity seen in the background is most likely due to the effect of benzodiazepine and is a benign EEG pattern. Priyanka Barbra Sarks       Assessment / Plan:    #60 60 year old white male alcoholic admitted after a fall at home, then found in the ER to have melena and anemia with 5 g drop in hemoglobin over the past month. Patient developed overt DTs within 24 hours of hospitalization and endoscopy has been on hold.  Hemoglobin has remained stable, no ongoing active bleeding  Patient is improved today, agitation has resolved and he is coherent.  Vital signs are stable  Remains on octreotide and IV PPI infusion  #2 no definite cirrhosis by imaging however does have thrombocytopenia, elevated T bili and mild coagulopathy suggestive of some component of synthetic dysfunction  #3 L1 compression fracture and probable chronic T6 compression fracture #4 anemia felt secondary to acute blood loss/subacute blood loss  Plan; keep n.p.o. Will be scheduled for EGD with Dr. Bryan Lemma this afternoon.  Procedure was discussed in detail with the patient's wife including indications risk and benefits and she is agreeable to proceed. Continue IV PPI infusion Continue octreotide until post EGD then may be able to discontinue ending findings.       Principal Problem:   GI bleed Active Problems:   Essential hypertension   Increased ammonia level   Thrombocytopenia (HCC)   Macrocytic anemia   QT prolongation   Acute blood loss anemia   Alcohol withdrawal seizure with complication ([place])     LOS: 3 days   Amy Esterwood PA-C 01/22/2021, 9:46 AM

## 2021-01-22 NOTE — Plan of Care (Signed)
°  Problem: Education: °Goal: Knowledge of General Education information will improve °Description: Including pain rating scale, medication(s)/side effects and non-pharmacologic comfort measures °Outcome: Progressing °  °Problem: Clinical Measurements: °Goal: Ability to maintain clinical measurements within normal limits will improve °Outcome: Progressing °Goal: Will remain free from infection °Outcome: Progressing °Goal: Respiratory complications will improve °Outcome: Progressing °Goal: Cardiovascular complication will be avoided °Outcome: Progressing °  °Problem: Nutrition: °Goal: Adequate nutrition will be maintained °Outcome: Progressing °  °

## 2021-01-22 NOTE — Op Note (Signed)
[facility] Patient Name: Richard Andrews Procedure Date : 01/22/2021 MRN: [id number] Attending MD: Doristine Locks , MD Date of Birth: February 25, 1961 CSN: [id number] Age: 60 Admit Type: Inpatient Procedure:                Upper GI endoscopy Indications:              Acute post hemorrhagic anemia, Melena                           60 yo male with EtOH use disorder presents after                            fall and noted to have anemia, melena on exam, and                            FOBT+ stools. Good response to pRBC transfusion.                            Hospital course complicated by EtOH withdrawal. Providers:                Doristine Locks, MD, Vicki Mallet, RN, Brion Aliment, Technician Referring MD:              Medicines:                Monitored Anesthesia Care Complications:            No immediate complications. Estimated Blood Loss:     Estimated blood loss was minimal. Procedure:                Pre-Anesthesia Assessment:                           - Prior to the procedure, a History and Physical                            was performed, and patient medications and                            allergies were reviewed. The patient's tolerance of                            previous anesthesia was also reviewed. The risks                            and benefits of the procedure and the sedation                            options and risks were discussed with the patient.                            All questions were answered, and informed consent  was obtained. Prior Anticoagulants: The patient has                            taken no previous anticoagulant or antiplatelet                            agents. ASA Grade Assessment: II - A patient with                            mild systemic disease. After reviewing the risks                            and benefits, the patient was deemed in                             satisfactory condition to undergo the procedure.                           After obtaining informed consent, the endoscope was                            passed under direct vision. Throughout the                            procedure, the patient's blood pressure, pulse, and                            oxygen saturations were monitored continuously. The                            [id number]-[id number] ([id number] Olympus endoscope was introduced                            through the mouth, and advanced to the second part                            of duodenum. The upper GI endoscopy was                            accomplished without difficulty. The patient                            tolerated the procedure well. Scope In: Scope Out: Findings:      The examined esophagus was normal.      Multiple non-bleeding cratered and superficial gastric ulcers with no       stigmata of bleeding were found in the gastric antrum. The largest       lesion was 5 mm in largest dimension. Biopsies were taken with a cold       forceps for histology and Helicobacter pylori testing. Estimated blood       loss was minimal.      Mild inflammation characterized by congestion (edema) and erythema was       found in the gastric fundus and in the gastric body. Biopsies were taken  with a cold forceps for Helicobacter pylori testing. Estimated blood       loss was minimal.      Two non-bleeding superficial duodenal ulcers with no stigmata of       bleeding were found in the duodenal bulb. The largest lesion was 5 mm in       largest dimension.      The second portion of the duodenum was normal. Impression:               - Normal esophagus.                           - Non-bleeding gastric ulcers with no stigmata of                            bleeding. Biopsied.                           - Gastritis. Biopsied.                           - Non-bleeding duodenal ulcers with no stigmata of                            bleeding.                            - Normal second portion of the duodenum. Recommendation:           - Return patient to hospital ward for ongoing care.                           - Full liquid diet now then advance as tolerated                            per Hospitalist service.                           - Use Protonix (pantoprazole) 40 mg PO BID for 8                            weeks, then reduce to 40 mg daily and titrate off                            if no further need for long term PPI.                           - Use sucralfate suspension 1 gram PO QID for 4                            weeks.                           - Await pathology results.                           - Can stop Octreotide.                           -  Repeat CBC 7-10 days after hospital discharge to                            ensure appropriate rise.                           - I discussed these results with his wife by phone.                           - Please do not hesitate to contact the inpatient                            GI service with additional questions or concerns. Procedure Code(s):        --- Professional ---                           [phone], Esophagogastroduodenoscopy, flexible,                            transoral; with biopsy, single or multiple Diagnosis Code(s):        --- Professional ---                           K25.9, Gastric ulcer, unspecified as acute or                            chronic, without hemorrhage or perforation                           K29.70, Gastritis, unspecified, without bleeding                           K26.9, Duodenal ulcer, unspecified as acute or                            chronic, without hemorrhage or perforation                           D62, Acute posthemorrhagic anemia                           K92.1, Melena (includes Hematochezia) CPT copyright 2019 American Medical [employer]. All rights reserved. The codes documented in this report are preliminary and upon coder review  may  be revised to meet current compliance requirements. Doristine Locks, MD 01/22/2021 1:09:27 PM Number of Addenda: 0

## 2021-01-22 NOTE — Progress Notes (Signed)
CSW received consult for substance use resources for patient. CSW met with patient at bedside. CSW offered patient outpatient substance use treatment services resources. Patient accepted.

## 2021-01-22 NOTE — Progress Notes (Signed)
   NAME:  Richard Andrews, MRN:  [id number], DOB:  [birth year], LOS: 3 ADMISSION DATE:  01/19/2021, CONSULTATION DATE:  01/21/21 REFERRING MD:  Tera Mater CHIEF COMPLAINT:  delirium tremens   History of Present Illness:  This 60 y.o. Caucasian male smoker is seen in consultation at the request of Dr. Dow Adolph for recommendations on further evaluation and management of delirium tremens.  The patient presented to [facility] ER on 01/19/2021 via EMS after experiencing a mechanical fall from standing height. The patient was admitted to the Hospitalist service for L1 compression fracture and GI bleed.  PCCM was consulted to see this patient this morning after the patient has required a total of 34 mg Ativan over the past 24 hours and request for consideration of Precedex infusion.  He reportedly had brief convulsive activity, described as lasting 5-8 seconds while receiving a bath this morning.  This was also treated with Ativan.  At the time of clinical encounter, the patient is lying in bed.  He is somewhat cooperative.  He does participate in clinical interview, although he is disoriented to place.  He knows it is 2022, oriented to self, but thinks that we are at his home.  He is a bit tremulous but show no overt seizure activity.  He is not having hallucinations.  He reports that he has frequent sensations of "free falling".  Pertinent  Medical History  Alcohol use disorder Anemia TCP HTN  Significant Hospital Events: Including procedures, antibiotic start and stop dates in addition to other pertinent events   Admitted 8/11, EGD deferred in setting active withdrawal, GI following   Interim History / Subjective:  No bloody bowel movements. NPO pending GI evaluation for possible endoscopy today. 18 mg total ativan over last 24h.  Objective   Blood pressure (!) 147/87, pulse 75, temperature 97.6 F (36.4 C), temperature source Axillary, resp. rate 15, height 6' (1.829 m), weight 61.6 kg,  SpO2 98 %.       No intake or output data in the 24 hours ending 01/22/21 1133 Filed Weights   01/19/21 0832 01/19/21 1515  Weight: 68.2 kg 61.6 kg    Examination: General appearance: 60 y.o., male, NAD, conversant  Eyes: anicteric sclerae, moist conjunctivae; no lid-lag; PERRL, tracking appropriately HENT: dry MM Lungs: CTAB, no crackles, no wheeze, with normal respiratory effort CV: RRR, no MRGs  Abdomen: Soft, non-tender; non-distended, BS present  Extremities: No peripheral edema, warm Neuro: Alert and oriented to person and place, and situation. Pretty attentive, almost able to name 4 months of year in reverse  Resolved Hospital Problem list     Assessment & Plan:   # Moderate-severe alcohol withdrawal: seems to be improving # Withdrawal-related seizure activity: brief episode at time of admission # Acute blood loss anemia # Thrombocytopenia # Transaminitis  - reasonable to continue CIWA based ativan only for now until EGD. Given severity of withdrawal manifestations he's had, would start librium po taper if cleared to take PO following endoscopy.  - trending CBC - minimize stimulation - thiamine supplementation - continues ppi, octreotide. If discovered to have variceal bleed, would restart ceftriaxone for SBP ppx  Will sign off but glad to be reinvolved as condition changes.  Laroy Apple Pulmonary/Critical Care

## 2021-01-23 ENCOUNTER — Inpatient Hospital Stay (HOSPITAL_COMMUNITY): Payer: BC Managed Care – PPO

## 2021-01-23 LAB — URINALYSIS, ROUTINE W REFLEX MICROSCOPIC
Bacteria, UA: NONE SEEN
Bilirubin Urine: NEGATIVE
Glucose, UA: NEGATIVE mg/dL
Hgb urine dipstick: NEGATIVE
Ketones, ur: NEGATIVE mg/dL
Leukocytes,Ua: NEGATIVE
Nitrite: NEGATIVE
Protein, ur: NEGATIVE mg/dL
Specific Gravity, Urine: 1.004 — ABNORMAL LOW (ref 1.005–1.030)
pH: 7 (ref 5.0–8.0)

## 2021-01-23 LAB — CBC WITH DIFFERENTIAL/PLATELET
Abs Immature Granulocytes: 0.05 10*3/uL (ref 0.00–0.07)
Basophils Absolute: 0 10*3/uL (ref 0.0–0.1)
Basophils Relative: 0 %
Eosinophils Absolute: 0.1 10*3/uL (ref 0.0–0.5)
Eosinophils Relative: 3 %
HCT: 25.5 % — ABNORMAL LOW (ref 39.0–52.0)
Hemoglobin: 8.9 g/dL — ABNORMAL LOW (ref 13.0–17.0)
Immature Granulocytes: 2 %
Lymphocytes Relative: 16 %
Lymphs Abs: 0.4 10*3/uL — ABNORMAL LOW (ref 0.7–4.0)
MCH: 35.5 pg — ABNORMAL HIGH (ref 26.0–34.0)
MCHC: 34.9 g/dL (ref 30.0–36.0)
MCV: 101.6 fL — ABNORMAL HIGH (ref 80.0–100.0)
Monocytes Absolute: 0.7 10*3/uL (ref 0.1–1.0)
Monocytes Relative: 27 %
Neutro Abs: 1.4 10*3/uL — ABNORMAL LOW (ref 1.7–7.7)
Neutrophils Relative %: 52 %
Platelets: 87 10*3/uL — ABNORMAL LOW (ref 150–400)
RBC: 2.51 MIL/uL — ABNORMAL LOW (ref 4.22–5.81)
RDW: 13.9 % (ref 11.5–15.5)
WBC: 2.7 10*3/uL — ABNORMAL LOW (ref 4.0–10.5)
nRBC: 0 % (ref 0.0–0.2)

## 2021-01-23 LAB — COMPREHENSIVE METABOLIC PANEL
ALT: 39 U/L (ref 0–44)
AST: 45 U/L — ABNORMAL HIGH (ref 15–41)
Albumin: 2.8 g/dL — ABNORMAL LOW (ref 3.5–5.0)
Alkaline Phosphatase: 77 U/L (ref 38–126)
Anion gap: 7 (ref 5–15)
BUN: 7 mg/dL — ABNORMAL LOW (ref 8–23)
CO2: 26 mmol/L (ref 22–32)
Calcium: 8.3 mg/dL — ABNORMAL LOW (ref 8.9–10.3)
Chloride: 101 mmol/L (ref 98–111)
Creatinine, Ser: 0.96 mg/dL (ref 0.61–1.24)
GFR, Estimated: >60 mL/min (ref >60)
Glucose, Bld: 106 mg/dL — ABNORMAL HIGH (ref 70–99)
Potassium: 3.3 mmol/L — ABNORMAL LOW (ref 3.5–5.1)
Sodium: 134 mmol/L — ABNORMAL LOW (ref 135–145)
Total Bilirubin: 2.2 mg/dL — ABNORMAL HIGH (ref 0.3–1.2)
Total Protein: 5.6 g/dL — ABNORMAL LOW (ref 6.5–8.1)

## 2021-01-23 LAB — CBC
HCT: 27.6 % — ABNORMAL LOW (ref 39.0–52.0)
Hemoglobin: 9.7 g/dL — ABNORMAL LOW (ref 13.0–17.0)
MCH: 35.5 pg — ABNORMAL HIGH (ref 26.0–34.0)
MCHC: 35.1 g/dL (ref 30.0–36.0)
MCV: 101.1 fL — ABNORMAL HIGH (ref 80.0–100.0)
Platelets: 107 10*3/uL — ABNORMAL LOW (ref 150–400)
RBC: 2.73 MIL/uL — ABNORMAL LOW (ref 4.22–5.81)
RDW: 13.8 % (ref 11.5–15.5)
WBC: 3.3 10*3/uL — ABNORMAL LOW (ref 4.0–10.5)
nRBC: 0 % (ref 0.0–0.2)

## 2021-01-23 LAB — PHOSPHORUS: Phosphorus: 3.8 mg/dL (ref 2.5–4.6)

## 2021-01-23 LAB — MAGNESIUM: Magnesium: 1.8 mg/dL (ref 1.7–2.4)

## 2021-01-23 MED ORDER — POTASSIUM CHLORIDE CRYS ER 20 MEQ PO TBCR
40.0000 meq | EXTENDED_RELEASE_TABLET | Freq: Once | ORAL | Status: AC
  Administered 2021-01-23: 40 meq via ORAL
  Filled 2021-01-23: qty 2

## 2021-01-23 NOTE — Progress Notes (Signed)
   01/22/21 2010  Assess: MEWS Score  Temp (!) 101.9 F (38.8 C)  BP 133/78  Pulse Rate 72  ECG Heart Rate 70  Resp 18  Level of Consciousness Alert  SpO2 99 %  O2 Device Room Air  Assess: MEWS Score  MEWS Temp 2  MEWS Systolic 0  MEWS Pulse 0  MEWS RR 0  MEWS LOC 0  MEWS Score 2  MEWS Score Color Yellow  Assess: if the MEWS score is Yellow or Red  Were vital signs taken at a resting state? Yes  Focused Assessment No change from prior assessment  Early Detection of Sepsis Score *See Row Information* Medium  MEWS guidelines implemented *See Row Information* Yes  Treat  MEWS Interventions Administered prn meds/treatments  Pain Scale 0-10  Pain Score 0  Take Vital Signs  Increase Vital Sign Frequency  Yellow: Q 2hr X 2 then Q 4hr X 2, if remains yellow, continue Q 4hrs  Escalate  MEWS: Escalate Yellow: discuss with charge nurse/RN and consider discussing with provider and RRT  Notify: Charge Nurse/RN  Name of Charge Nurse/RN Notified Annabelle Harman  Date Charge Nurse/RN Notified 01/22/21  Time Charge Nurse/RN Notified [id number]  Notify: Provider  Provider Name/Title C. Margo Aye, MD  Date Provider Notified 01/22/21  Time Provider Notified [phone]  Notification Type Page  Notification Reason Change in status (yellow mews, medium sepsis score)  Provider response No new orders  Date of Provider Response 01/22/21  Time of Provider Response [phone]  Document  Progress note created (see row info) Yes

## 2021-01-23 NOTE — Progress Notes (Signed)
PROGRESS NOTE    Richard DuralLarry F Andrews  ZOX:[id number] DOB: Jul 09, 1960 DOA: 01/19/2021 PCP: Christen ButterJessup, Joy, NP   Chief Complaint  Patient presents with   Fall    Brief Narrative: 60 year old male with history of alcohol abuse, thrombocytopenia, macrocytic anemia admitted with a fall, recently  ED visit with diagnosis of L1 compression fracture. Work-up in the ED showed a significant anemia melena Hemoccult positive stool. Patient also had alcohol withdrawal. Seen by GI EGD 8/2 multiple gastric ulcers and duodenal ulcers, clean-based, biopsied  Subjective: Seen and examined this morning.  Patient is alert awake oriented x2-3.  He has no tremors.  No cough no abdominal pain.  Nursing feels patient appears much better today. Overnight patient had fever of 101.9 at 8 PM, with yellow MEWS- Dr Margo AyeHall reviewed  Assessment & Plan:  Acute blood loss anemia due to GI bleed due to gastric/duodenal ulcers Status post 2 unit PRBC, H&H appropriately increased.  Status post vitamin K per GI due to coagulopathy.  Seen by GI and status post EGD 8/12 multiple gastric ulcers and duodenal ulcers, clean-based, biopsied. keep on PPI twice daily, Carafate, monitor H&H Recent Labs  Lab 01/21/21 0621 01/21/21 1715 01/22/21 0636 01/22/21 1744 01/23/21 0523  HGB 8.4* 9.0* 8.9* 9.3* 8.9*  HCT 23.9* 25.8* 25.3* 26.9* 25.5*   Fever episode overnight 8/12: Patient with alcohol withdrawals that could contribute. will obtain chest x-ray, UA. He has no leukocytosis but has leukopenia.  If he has spike again we will get blood culture  Thrombocytopenia/pancytopenia with anemia leukopenia: Suspect due to alcohol abuse. Will check B12 level  Alcohol abuse with active DTs: Seen by PCCM.  Managed with CIWA protocol.  At this time no withdrawal symptoms.  Continue thiamine, folate. Had episodes of tremors/?  Seizures but EEG no acute findings likely from withdrawals.  Essential hypertension: Blood pressures controlled without  meds Hypokalemia-we will replete this morning Hypomagnesemia-resolved QT prolongation: Avoid QT prolonging medication.  Monitor Diet Order             Diet full liquid Room service appropriate? Yes; Fluid consistency: Thin  Diet effective now                         Patient's Body mass index is 18.42 kg/m. DVT prophylaxis: SCDs Start: 01/19/21 1654 no chemical prophylaxis due to anemia Code Status:   Code Status: Full Code  Family Communication: plan of care discussed with patient at bedside. Status is: Inpatient  Remains inpatient appropriate because:Inpatient level of care appropriate due to severity of illness  Dispo: The patient is from: Home              Anticipated d/c is to:  TBD. Will obtain Ptot eval              Patient currently is not medically stable to d/c.   Difficult to place patient No  Unresulted Labs (From admission, onward)     Start     Ordered   01/21/21 1711  CBC  Now then every 12 hours,   R (with TIMED occurrences)     Comments: Call MD for hemoglobin 7 or less   Question:  Specimen collection method  Answer:  Lab=Lab collect   01/21/21 1217   01/20/21 0500  Comprehensive metabolic panel  Daily,   R      01/19/21 1705   01/20/21 0500  CBC with Differential/Platelet  Daily,   R  01/19/21 1705   01/20/21 0500  Magnesium  Daily,   R      01/19/21 1705   01/20/21 0500  Phosphorus  Daily,   R      01/19/21 1705           Medications reviewed:  Scheduled Meds:  sodium chloride   Intravenous Once   folic acid  1 mg Oral Daily   LORazepam  0-4 mg Intravenous Q8H   multivitamin with minerals  1 tablet Oral Daily   pantoprazole  40 mg Oral BID   sucralfate  1 g Oral TID WC & HS   thiamine  100 mg Oral Daily   Or   thiamine  100 mg Intravenous Daily   Continuous Infusions:  sodium chloride     sodium chloride 75 mL/hr at 01/22/21 2335   Consultants:see note  Procedures:see note Antimicrobials: Anti-infectives (From admission,  onward)    Start     Dose/Rate Route Frequency Ordered Stop   01/20/21 1530  cefTRIAXone (ROCEPHIN) 1 g in sodium chloride 0.9 % 100 mL IVPB        1 g 200 mL/hr over 30 Minutes Intravenous  Once 01/20/21 1432 01/20/21 1621      Culture/Microbiology No results found for: SDES, SPECREQUEST, CULT, REPTSTATUS  Other culture-see note  Objective: Vitals: Today's Vitals   01/23/21 0030 01/23/21 0215 01/23/21 0408 01/23/21 0726  BP: 122/74  (!) 129/100 117/67  Pulse: (!) 55 (!) 50 (!) 48 (!) 55  Resp: 14 11 12 15   Temp: (!) 97.3 F (36.3 C)  97.9 F (36.6 C)   TempSrc:   Axillary   SpO2: 90% 95% 98% 98%  Weight:      Height:      PainSc:        Intake/Output Summary (Last 24 hours) at 01/23/2021 0816 Last data filed at 01/23/2021 0602 Gross per 24 hour  Intake 2918.15 ml  Output 1000 ml  Net 1918.15 ml   Filed Weights   01/19/21 0832 01/19/21 1515 01/22/21 1216  Weight: 68.2 kg 61.6 kg 61.6 kg   Weight change:   Intake/Output from previous day: 08/12 0701 - 08/13 0700 In: 2918.2 [P.O.:600; I.V.:2318.2] Out: 1000 [Urine:1000] Intake/Output this shift: No intake/output data recorded. Filed Weights   01/19/21 0832 01/19/21 1515 01/22/21 1216  Weight: 68.2 kg 61.6 kg 61.6 kg   Examination: General exam: AAO to self place current president but not to exact date, thin frail, older than stated age, weak appearing. HEENT:Oral mucosa moist, Ear/Nose WNL grossly,dentition normal. Respiratory system: bilaterally diminished,no use of accessory muscle, non tender. Cardiovascular system: S1 & S2 +,No JVD. Gastrointestinal system: Abdomen soft, NT,ND, BS+. Nervous System:Alert, awake, moving extremities Extremities: no edema, distal peripheral pulses palpable.  Skin: No rashes,no icterus. MSK: Normal muscle bulk,tone, power Data Reviewed: I have personally reviewed following labs and imaging studies CBC: Recent Labs  Lab 01/19/21 1756 01/20/21 0545 01/20/21 1207  01/21/21 0621 01/21/21 1715 01/22/21 0636 01/22/21 1744 01/23/21 0523  WBC 7.8 3.9*   < > 2.8* 3.4* 3.2* 3.5* 2.7*  NEUTROABS 5.7 2.7  --  1.7  --  1.7  --  1.4*  HGB 7.1* 8.0*   < > 8.4* 9.0* 8.9* 9.3* 8.9*  HCT 20.3* 23.6*   < > 23.9* 25.8* 25.3* 26.9* 25.5*  MCV 107.4* 102.6*   < > 101.7* 104.0* 100.8* 103.1* 101.6*  PLT DCLMP 69*   < > 78* 86* 91* 93* 87*   < > =  values in this interval not displayed.   Basic Metabolic Panel: Recent Labs  Lab 01/19/21 1756 01/20/21 0545 01/20/21 1207 01/21/21 0621 01/22/21 0636  NA 135 135 138 135 136  K 3.5 3.2* 3.8 4.4 3.5  CL 105 107 109 106 100  CO2 22 22 21* 23 27  GLUCOSE 125* 103* 97 127* 123*  BUN 32* 24* 22 13 8   CREATININE 0.90 0.91 0.98 0.83 0.92  CALCIUM 8.6* 8.0* 8.2* 8.3* 8.6*  MG 1.8 1.7 2.0 1.8 1.6*  PHOS 2.3* 2.4* 2.4* 3.3 4.6   GFR: Estimated Creatinine Clearance: 73.5 mL/min (by C-G formula based on SCr of 0.92 mg/dL). Liver Function Tests: Recent Labs  Lab 01/19/21 1756 01/20/21 0545 01/20/21 1207 01/21/21 0621 01/22/21 0636  AST 44* 48* 72* 86* 62*  ALT 37 35 42 52* 46*  ALKPHOS 60 53 60 59 74  BILITOT 1.6* 1.3* 1.3* 1.4* 1.7*  PROT 6.3* 5.5* 6.0* 5.4* 5.7*  ALBUMIN 3.5 3.0* 3.2* 3.0* 2.9*   Recent Labs  Lab 01/19/21 0920  LIPASE 49   Recent Labs  Lab 01/19/21 1756  AMMONIA 23   Coagulation Profile: Recent Labs  Lab 01/19/21 0920  INR 1.4*   Cardiac Enzymes: No results for input(s): CKTOTAL, CKMB, CKMBINDEX, TROPONINI in the last 168 hours. BNP (last 3 results) No results for input(s): PROBNP in the last 8760 hours. HbA1C: No results for input(s): HGBA1C in the last 72 hours. CBG: No results for input(s): GLUCAP in the last 168 hours. Lipid Profile: No results for input(s): CHOL, HDL, LDLCALC, TRIG, CHOLHDL, LDLDIRECT in the last 72 hours. Thyroid Function Tests: No results for input(s): TSH, T4TOTAL, FREET4, T3FREE, THYROIDAB in the last 72 hours. Anemia Panel: No results for  input(s): VITAMINB12, FOLATE, FERRITIN, TIBC, IRON, RETICCTPCT in the last 72 hours. Sepsis Labs: Recent Labs  Lab 01/19/21 0920 01/19/21 1128  LATICACIDVEN 3.1* 2.7*    Recent Results (from the past 240 hour(s))  Resp Panel by RT-PCR (Flu A&B, Covid) Nasopharyngeal Swab     Status: None   Collection Time: 01/19/21  9:03 AM   Specimen: Nasopharyngeal Swab; Nasopharyngeal(NP) swabs in vial transport medium  Result Value Ref Range Status   SARS Coronavirus 2 by RT PCR NEGATIVE NEGATIVE Final    Comment: (NOTE) SARS-CoV-2 target nucleic acids are NOT DETECTED.  The SARS-CoV-2 RNA is generally detectable in upper respiratory specimens during the acute phase of infection. The lowest concentration of SARS-CoV-2 viral copies this assay can detect is 138 copies/mL. A negative result does not preclude SARS-Cov-2 infection and should not be used as the sole basis for treatment or other patient management decisions. A negative result may occur with  improper specimen collection/handling, submission of specimen other than nasopharyngeal swab, presence of viral mutation(s) within the areas targeted by this assay, and inadequate number of viral copies(<138 copies/mL). A negative result must be combined with clinical observations, patient history, and epidemiological information. The expected result is Negative.  Fact Sheet for Patients:  03/21/21  Fact Sheet for Healthcare Providers:  BloggerCourse.com  This test is no t yet approved or cleared by the [place] FDA and  has been authorized for detection and/or diagnosis of SARS-CoV-2 by FDA under an Emergency Use Authorization (EUA). This EUA will remain  in effect (meaning this test can be used) for the duration of the COVID-19 declaration under Section 564(b)(1) of the Act, 21 U.S.C.section 360bbb-3(b)(1), unless the authorization is terminated  or revoked sooner.  Influenza A by PCR NEGATIVE NEGATIVE Final   Influenza B by PCR NEGATIVE NEGATIVE Final    Comment: (NOTE) The Xpert Xpress SARS-CoV-2/FLU/RSV plus assay is intended as an aid in the diagnosis of influenza from Nasopharyngeal swab specimens and should not be used as a sole basis for treatment. Nasal washings and aspirates are unacceptable for Xpert Xpress SARS-CoV-2/FLU/RSV testing.  Fact Sheet for Patients: BloggerCourse.com  Fact Sheet for Healthcare Providers: SeriousBroker.it  This test is not yet approved or cleared by the [place] FDA and has been authorized for detection and/or diagnosis of SARS-CoV-2 by FDA under an Emergency Use Authorization (EUA). This EUA will remain in effect (meaning this test can be used) for the duration of the COVID-19 declaration under Section 564(b)(1) of the Act, 21 U.S.C. section 360bbb-3(b)(1), unless the authorization is terminated or revoked.  Performed at [facility], [address], [place], [name] [address]      Radiology Studies: EEG adult  Result Date: 02-11-2021 Charlsie Quest, MD     11-Feb-2021 11:25 AM Patient Name: Richard Andrews MRN: [id number] Epilepsy Attending: Charlsie Quest Referring Physician/Provider: Dr Dow Adolph Date: 2021/02/11 Duration: 26.22 mins Patient history: 60 year old male with seizure-like activity.  EEG to evaluate for seizures. Level of alertness: Awake, asleep AEDs during EEG study: Ativan Technical aspects: This EEG study was done with scalp electrodes positioned according to the 10-20 International system of electrode placement. Electrical activity was acquired at a sampling rate of 500Hz  and reviewed with a high frequency filter of 70Hz  and a low frequency filter of 1Hz . EEG data were recorded continuously and digitally stored. Description: The posterior dominant rhythm consists of 9 Hz activity of moderate voltage (25-35 uV) seen  predominantly in posterior head regions, symmetric and reactive to eye opening and eye closing. Sleep was characterized by vertex waves, sleep spindles (12 to 14 Hz), maximal frontocentral region.  There is an excessive amount of 15 to 18 HzV beta activity distributed symmetrically and diffusely. Physiologic photic driving was seen during photic stimulation. Hyperventilation was not performed.   ABNORMALITY - Excessive beta, generalized IMPRESSION: This study is within normal limits. No seizures or epileptiform discharges were seen throughout the recording. The excessive beta activity seen in the background is most likely due to the effect of benzodiazepine and is a benign EEG pattern.     LOS: 4 days   , MD [facility]  01/23/2021, 8:16 AM

## 2021-01-24 DIAGNOSIS — R509 Fever, unspecified: Secondary | ICD-10-CM

## 2021-01-24 LAB — CBC WITH DIFFERENTIAL/PLATELET
Abs Immature Granulocytes: 0.03 10*3/uL (ref 0.00–0.07)
Basophils Absolute: 0 10*3/uL (ref 0.0–0.1)
Basophils Relative: 0 %
Eosinophils Absolute: 0.1 10*3/uL (ref 0.0–0.5)
Eosinophils Relative: 5 %
HCT: 26.1 % — ABNORMAL LOW (ref 39.0–52.0)
Hemoglobin: 9.1 g/dL — ABNORMAL LOW (ref 13.0–17.0)
Immature Granulocytes: 1 %
Lymphocytes Relative: 30 %
Lymphs Abs: 0.8 10*3/uL (ref 0.7–4.0)
MCH: 35.7 pg — ABNORMAL HIGH (ref 26.0–34.0)
MCHC: 34.9 g/dL (ref 30.0–36.0)
MCV: 102.4 fL — ABNORMAL HIGH (ref 80.0–100.0)
Monocytes Absolute: 0.7 10*3/uL (ref 0.1–1.0)
Monocytes Relative: 26 %
Neutro Abs: 1.1 10*3/uL — ABNORMAL LOW (ref 1.7–7.7)
Neutrophils Relative %: 38 %
Platelets: 109 10*3/uL — ABNORMAL LOW (ref 150–400)
RBC: 2.55 MIL/uL — ABNORMAL LOW (ref 4.22–5.81)
RDW: 13.7 % (ref 11.5–15.5)
WBC: 2.8 10*3/uL — ABNORMAL LOW (ref 4.0–10.5)
nRBC: 0 % (ref 0.0–0.2)

## 2021-01-24 LAB — COMPREHENSIVE METABOLIC PANEL
ALT: 35 U/L (ref 0–44)
AST: 41 U/L (ref 15–41)
Albumin: 2.8 g/dL — ABNORMAL LOW (ref 3.5–5.0)
Alkaline Phosphatase: 93 U/L (ref 38–126)
Anion gap: 5 (ref 5–15)
BUN: 6 mg/dL — ABNORMAL LOW (ref 8–23)
CO2: 28 mmol/L (ref 22–32)
Calcium: 8.5 mg/dL — ABNORMAL LOW (ref 8.9–10.3)
Chloride: 103 mmol/L (ref 98–111)
Creatinine, Ser: 0.97 mg/dL (ref 0.61–1.24)
GFR, Estimated: >60 mL/min (ref >60)
Glucose, Bld: 101 mg/dL — ABNORMAL HIGH (ref 70–99)
Potassium: 3.6 mmol/L (ref 3.5–5.1)
Sodium: 136 mmol/L (ref 135–145)
Total Bilirubin: 1.1 mg/dL (ref 0.3–1.2)
Total Protein: 5.8 g/dL — ABNORMAL LOW (ref 6.5–8.1)

## 2021-01-24 LAB — CBC
HCT: 28.2 % — ABNORMAL LOW (ref 39.0–52.0)
Hemoglobin: 9.8 g/dL — ABNORMAL LOW (ref 13.0–17.0)
MCH: 35.4 pg — ABNORMAL HIGH (ref 26.0–34.0)
MCHC: 34.8 g/dL (ref 30.0–36.0)
MCV: 101.8 fL — ABNORMAL HIGH (ref 80.0–100.0)
Platelets: 128 10*3/uL — ABNORMAL LOW (ref 150–400)
RBC: 2.77 MIL/uL — ABNORMAL LOW (ref 4.22–5.81)
RDW: 13.6 % (ref 11.5–15.5)
WBC: 3.7 10*3/uL — ABNORMAL LOW (ref 4.0–10.5)
nRBC: 0 % (ref 0.0–0.2)

## 2021-01-24 LAB — MAGNESIUM: Magnesium: 1.7 mg/dL (ref 1.7–2.4)

## 2021-01-24 LAB — VITAMIN B12: Vitamin B-12: 985 pg/mL — ABNORMAL HIGH (ref 180–914)

## 2021-01-24 LAB — PHOSPHORUS: Phosphorus: 3.2 mg/dL (ref 2.5–4.6)

## 2021-01-24 NOTE — Progress Notes (Signed)
PROGRESS NOTE    Richard Andrews  [id number] DOB: 1961/05/19 DOA: 01/19/2021 PCP: Christen ButterJessup, Joy, NP   Chief Complaint  Patient presents with   Fall    Brief Narrative:  60 year old male with history of alcohol abuse, thrombocytopenia, macrocytic anemia admitted with a fall, recently  ED visit with diagnosis of L1 compression fracture. Work-up in the ED showed a significant anemia melena Hemoccult positive stool. Patient also had alcohol withdrawal. Seen by GI EGD 8/2 multiple gastric ulcers and duodenal ulcers, clean-based, biopsied  Subjective:  Patient denies any complaints overnight, no significant events, is afebrile over last 24 hours.  Assessment & Plan:  Acute blood loss anemia due to GI bleed due to gastric/duodenal ulcers Status post 2 unit PRBC, H&H appropriately increased.  Status post vitamin K per GI due to coagulopathy.  Seen by GI and status post EGD 8/12 multiple gastric ulcers and duodenal ulcers, clean-based, biopsied. keep on PPI twice daily, Carafate, monitor H&H, so far remained stable  Recent Labs  Lab 01/22/21 0636 01/22/21 1744 01/23/21 0523 01/23/21 1809 01/24/21 0702  HGB 8.9* 9.3* 8.9* 9.7* 9.1*  HCT 25.3* 26.9* 25.5* 27.6* 26.1*   Fever episode overnight 8/12: Patient with alcohol withdrawals that could contribute.  Chest x-ray, urinalysis negative, patient is nontoxic-appearing, this is most likely in the setting of alcohol withdrawals.    Thrombocytopenia/pancytopenia with anemia leukopenia: Suspect due to alcohol abuse.  B12 improving  Alcohol abuse with active DTs: Seen by PCCM.  Managed with CIWA protocol.  At this time no withdrawal symptoms.  Continue thiamine, folate. Had episodes of tremors/?  Seizures but EEG no acute findings likely from withdrawals. -Improving  Essential hypertension: Blood pressures controlled without meds Hypokalemia-we will replete this morning Hypomagnesemia-resolved QT prolongation: Avoid QT prolonging  medication.  Monitor Diet Order             DIET SOFT Room service appropriate? Yes; Fluid consistency: Thin  Diet effective now                         Patient's Body mass index is 18.42 kg/m. DVT prophylaxis: SCDs Start: 01/19/21 1654 no chemical prophylaxis due to anemia Code Status:   Code Status: Full Code  Family Communication: plan of care discussed with patient at bedside. Status is: Inpatient  Remains inpatient appropriate because:Inpatient level of care appropriate due to severity of illness  Dispo: The patient is from: Home              Anticipated d/c is to:  TBD. Will obtain Ptot eval              Patient currently is not medically stable to d/c.   Difficult to place patient No  Unresulted Labs (From admission, onward)     Start     Ordered   01/21/21 1711  CBC  Now then every 12 hours,   R (with TIMED occurrences)     Comments: Call MD for hemoglobin 7 or less   Question:  Specimen collection method  Answer:  Lab=Lab collect   01/21/21 1217   01/20/21 0500  CBC with Differential/Platelet  Daily,   R      01/19/21 1705   01/20/21 0500  Magnesium  Daily,   R      01/19/21 1705   01/20/21 0500  Phosphorus  Daily,   R      01/19/21 1705  Medications reviewed:  Scheduled Meds:  sodium chloride   Intravenous Once   folic acid  1 mg Oral Daily   multivitamin with minerals  1 tablet Oral Daily   pantoprazole  40 mg Oral BID   sucralfate  1 g Oral TID WC & HS   thiamine  100 mg Oral Daily   Or   thiamine  100 mg Intravenous Daily   Continuous Infusions:  sodium chloride     sodium chloride 75 mL/hr at 01/24/21 1332   Consultants:see note  Procedures:see note Antimicrobials: Anti-infectives (From admission, onward)    Start     Dose/Rate Route Frequency Ordered Stop   01/20/21 1530  cefTRIAXone (ROCEPHIN) 1 g in sodium chloride 0.9 % 100 mL IVPB        1 g 200 mL/hr over 30 Minutes Intravenous  Once 01/20/21 1432 01/20/21 1621       Culture/Microbiology No results found for: SDES, SPECREQUEST, CULT, REPTSTATUS  Other culture-see note  Objective: Vitals: Today's Vitals   01/24/21 0057 01/24/21 0422 01/24/21 0735 01/24/21 1129  BP:  113/76 117/77 103/66  Pulse:  (!) 50 (!) 56 73  Resp:  12 14 12   Temp:  98.4 F (36.9 C) 98.6 F (37 C) 98.5 F (36.9 C)  TempSrc:  Oral Oral Oral  SpO2:  95% 100% 99%  Weight:      Height:      PainSc: 0-No pain       Intake/Output Summary (Last 24 hours) at 01/24/2021 1503 Last data filed at 01/24/2021 1428 Gross per 24 hour  Intake 2460.48 ml  Output 4025 ml  Net -1564.52 ml   Filed Weights   01/19/21 0832 01/19/21 1515 01/22/21 1216  Weight: 68.2 kg 61.6 kg 61.6 kg   Weight change:   Intake/Output from previous day: 08/13 0701 - 08/14 0700 In: 1868.5 [I.V.:1868.5] Out: 3025 [Urine:3025] Intake/Output this shift: Total I/O In: 592 [P.O.:592] Out: 1000 [Urine:1000] Filed Weights   01/19/21 0832 01/19/21 1515 01/22/21 1216  Weight: 68.2 kg 61.6 kg 61.6 kg   Examination:  Awake Alert, Oriented X 3, is more appropriate and coherent today. Symmetrical Chest wall movement, Good air movement bilaterally, CTAB RRR,No Gallops,Rubs or new Murmurs, No Parasternal Heave +ve B.Sounds, Abd Soft, No tenderness, No rebound - guarding or rigidity. No Cyanosis, Clubbing or edema, No new Rash or bruise    CBC: Recent Labs  Lab 01/20/21 0545 01/20/21 1207 01/21/21 0621 01/21/21 1715 01/22/21 0636 01/22/21 1744 01/23/21 0523 01/23/21 1809 01/24/21 0702  WBC 3.9*   < > 2.8*   < > 3.2* 3.5* 2.7* 3.3* 2.8*  NEUTROABS 2.7  --  1.7  --  1.7  --  1.4*  --  1.1*  HGB 8.0*   < > 8.4*   < > 8.9* 9.3* 8.9* 9.7* 9.1*  HCT 23.6*   < > 23.9*   < > 25.3* 26.9* 25.5* 27.6* 26.1*  MCV 102.6*   < > 101.7*   < > 100.8* 103.1* 101.6* 101.1* 102.4*  PLT 69*   < > 78*   < > 91* 93* 87* 107* 109*   < > = values in this interval not displayed.   Basic Metabolic Panel: Recent Labs   Lab 01/20/21 1207 01/21/21 0621 01/22/21 0636 01/23/21 0523 01/24/21 0702  NA 138 135 136 134* 136  K 3.8 4.4 3.5 3.3* 3.6  CL 109 106 100 101 103  CO2 21* 23 27 26 28   GLUCOSE 97 127*  123* 106* 101*  BUN 22 13 8  7* 6*  CREATININE 0.98 0.83 0.92 0.96 0.97  CALCIUM 8.2* 8.3* 8.6* 8.3* 8.5*  MG 2.0 1.8 1.6* 1.8 1.7  PHOS 2.4* 3.3 4.6 3.8 3.2   GFR: Estimated Creatinine Clearance: 69.7 mL/min (by C-G formula based on SCr of 0.97 mg/dL). Liver Function Tests: Recent Labs  Lab 01/20/21 1207 01/21/21 0621 01/22/21 0636 01/23/21 0523 01/24/21 0702  AST 72* 86* 62* 45* 41  ALT 42 52* 46* 39 35  ALKPHOS 60 59 74 77 93  BILITOT 1.3* 1.4* 1.7* 2.2* 1.1  PROT 6.0* 5.4* 5.7* 5.6* 5.8*  ALBUMIN 3.2* 3.0* 2.9* 2.8* 2.8*   Recent Labs  Lab 01/19/21 0920  LIPASE 49   Recent Labs  Lab 01/19/21 1756  AMMONIA 23   Coagulation Profile: Recent Labs  Lab 01/19/21 0920  INR 1.4*   Cardiac Enzymes: No results for input(s): CKTOTAL, CKMB, CKMBINDEX, TROPONINI in the last 168 hours. BNP (last 3 results) No results for input(s): PROBNP in the last 8760 hours. HbA1C: No results for input(s): HGBA1C in the last 72 hours. CBG: No results for input(s): GLUCAP in the last 168 hours. Lipid Profile: No results for input(s): CHOL, HDL, LDLCALC, TRIG, CHOLHDL, LDLDIRECT in the last 72 hours. Thyroid Function Tests: No results for input(s): TSH, T4TOTAL, FREET4, T3FREE, THYROIDAB in the last 72 hours. Anemia Panel: Recent Labs    01/24/21 0757  VITAMINB12 985*   Sepsis Labs: Recent Labs  Lab 01/19/21 0920 01/19/21 1128  LATICACIDVEN 3.1* 2.7*    Recent Results (from the past 240 hour(s))  Resp Panel by RT-PCR (Flu A&B, Covid) Nasopharyngeal Swab     Status: None   Collection Time: 01/19/21  9:03 AM   Specimen: Nasopharyngeal Swab; Nasopharyngeal(NP) swabs in vial transport medium  Result Value Ref Range Status   SARS Coronavirus 2 by RT PCR NEGATIVE NEGATIVE Final     Comment: (NOTE) SARS-CoV-2 target nucleic acids are NOT DETECTED.  The SARS-CoV-2 RNA is generally detectable in upper respiratory specimens during the acute phase of infection. The lowest concentration of SARS-CoV-2 viral copies this assay can detect is 138 copies/mL. A negative result does not preclude SARS-Cov-2 infection and should not be used as the sole basis for treatment or other patient management decisions. A negative result may occur with  improper specimen collection/handling, submission of specimen other than nasopharyngeal swab, presence of viral mutation(s) within the areas targeted by this assay, and inadequate number of viral copies(<138 copies/mL). A negative result must be combined with clinical observations, patient history, and epidemiological information. The expected result is Negative.  Fact Sheet for Patients:  03/21/21  Fact Sheet for Healthcare Providers:  BloggerCourse.com  This test is no t yet approved or cleared by the [employer] FDA and  has been authorized for detection and/or diagnosis of SARS-CoV-2 by FDA under an Emergency Use Authorization (EUA). This EUA will remain  in effect (meaning this test can be used) for the duration of the COVID-19 declaration under Section 564(b)(1) of the Act, 21 U.S.C.section 360bbb-3(b)(1), unless the authorization is terminated  or revoked sooner.       Influenza A by PCR NEGATIVE NEGATIVE Final   Influenza B by PCR NEGATIVE NEGATIVE Final    Comment: (NOTE) The Xpert Xpress SARS-CoV-2/FLU/RSV plus assay is intended as an aid in the diagnosis of influenza from Nasopharyngeal swab specimens and should not be used as a sole basis for treatment. Nasal washings and aspirates are unacceptable for  Xpert Xpress SARS-CoV-2/FLU/RSV testing.  Fact Sheet for Patients: BloggerCourse.com  Fact Sheet for Healthcare  Providers: SeriousBroker.it  This test is not yet approved or cleared by the [place] FDA and has been authorized for detection and/or diagnosis of SARS-CoV-2 by FDA under an Emergency Use Authorization (EUA). This EUA will remain in effect (meaning this test can be used) for the duration of the COVID-19 declaration under Section 564(b)(1) of the Act, 21 U.S.C. section 360bbb-3(b)(1), unless the authorization is terminated or revoked.  Performed at [facility], [address] [place], [name] [address]      Radiology Studies: [facility] 1 View  Result Date: 01/23/2021 CLINICAL DATA:  Evaluate pneumonia.  EGD January 22, 2021. EXAM: PORTABLE CHEST 1 VIEW COMPARISON:  January 19, 2021 FINDINGS: The heart size and mediastinal contours are within normal limits. Both lungs are clear. The visualized skeletal structures are unremarkable. IMPRESSION: No active disease. Electronically Signed   By: Gerome Sam III M.D.   On: 01/23/2021 14:32     LOS: 5 days   Huey Bienenstock, MD [facility]  01/24/2021, 3:03 PM

## 2021-01-24 NOTE — Evaluation (Signed)
Physical Therapy Evaluation Patient Details Name: Richard Andrews MRN: [id number] DOB: 10/19/1960 Today's Date: 01/24/2021   History of Present Illness  60 year old male with history of alcohol abuse, thrombocytopenia, macrocytic anemia admitted with a fall, recently  ED visit with diagnosis of L1 compression fracture. GI bleed, active delirium tremens.  Clinical Impression  Pt admitted with above diagnosis. Ambulates with CGA, using a rolling walker for stability up to 120 feet today. Previously independent, works as a Emergency planning/management officer for [employer]. Denies back pain, however reviewed precautions to avoid excessive stress at L1. See orthostatics below (pt was asymptomatic throughout session.) Pt currently with functional limitations due to the deficits listed below (see PT Problem List). Pt will benefit from skilled PT to increase their independence and safety with mobility to allow discharge to the venue listed below.     01/24/21 0900  Orthostatic Lying   BP- Lying (!) 142/96  Pulse- Lying 66  Orthostatic Sitting  BP- Sitting 117/85  Pulse- Sitting 86  Orthostatic Standing at 0 minutes  BP- Standing at 0 minutes 110/83  Pulse- Standing at 0 minutes 106      Follow Up Recommendations Outpatient PT    Equipment Recommendations  Rolling walker with 5" wheels    Recommendations for Other Services OT consult     Precautions / Restrictions Precautions Precautions: Fall;Back (L1 compression fx) Precaution Comments: Discussed precautions due to compression fracture. Not formally ordered Restrictions Weight Bearing Restrictions: No      Mobility  Bed Mobility Overal bed mobility: Modified Independent             General bed mobility comments: extra time    Transfers Overall transfer level: Needs assistance Equipment used: Rolling walker (2 wheeled) Transfers: Sit to/from Stand Sit to Stand: Min guard         General transfer comment: Min guard for safety, cues  for technique. Some posterior instability noted, using back of knees to stabilize on bed.  Ambulation/Gait Ambulation/Gait assistance: Min guard Gait Distance (Feet): 120 Feet Assistive device: Rolling walker (2 wheeled) Gait Pattern/deviations: Step-through pattern;Drifts right/left Gait velocity: decreased   General Gait Details: Educated on safe DME use with RW. Showing some mild instability and ddrifting. CGA for safety. Decreased stride and slower speed. Majority of distance pt HR around 100 however while pt standing and therapist organizing leads beside bed pt HR jumpted to 120 and sustained this rate for approx 5 min, asymptomatic. Returned to 70s once supine in bed.  Stairs            Wheelchair Mobility    Modified Rankin (Stroke Patients Only)       Balance Overall balance assessment: Needs assistance Sitting-balance support: Bilateral upper extremity supported Sitting balance-Leahy Scale: Good   Postural control: Posterior lean Standing balance support: No upper extremity supported Standing balance-Leahy Scale: Fair                               Pertinent Vitals/Pain Pain Assessment: No/denies pain    Home Living Family/patient expects to be discharged to:: Private residence Living Arrangements: Spouse/significant other;Children Available Help at Discharge: Family;Available PRN/intermittently Type of Home: House Home Access: Ramped entrance     Home Layout: Two level;1/2 bath on main level Home Equipment: None      Prior Function Level of Independence: Independent         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[employer]  Hand Dominance   Dominant Hand:  (Ambi)    Extremity/Trunk Assessment   Upper Extremity Assessment Upper Extremity Assessment: Defer to OT evaluation    Lower Extremity Assessment Lower Extremity Assessment: Generalized weakness       Communication   Communication: No difficulties   Cognition Arousal/Alertness: Awake/alert Behavior During Therapy: WFL for tasks assessed/performed Overall Cognitive Status: Within Functional Limits for tasks assessed                                        General Comments General comments (skin integrity, edema, etc.): See orthostatics section.    Exercises     Assessment/Plan    PT Assessment Patient needs continued PT services  PT Problem List Decreased strength;Decreased activity tolerance;Decreased balance;Decreased mobility;Decreased coordination;Decreased knowledge of use of DME;Cardiopulmonary status limiting activity       PT Treatment Interventions DME instruction;Gait training;Stair training;Functional mobility training;Therapeutic activities;Therapeutic exercise;Balance training;Neuromuscular re-education;Patient/family education    PT Goals (Current goals can be found in the Care Plan section)  Acute Rehab PT Goals Patient Stated Goal: get back to work PT Goal Formulation: With patient Time For Goal Achievement: 02/07/21 Potential to Achieve Goals: Good    Frequency Min 5X/week   Barriers to discharge Decreased caregiver support Spouse works    Co-evaluation               AM-PAC PT "6 Clicks" Mobility  Outcome Measure Help needed turning from your back to your side while in a flat bed without using bedrails?: None Help needed moving from lying on your back to sitting on the side of a flat bed without using bedrails?: None Help needed moving to and from a bed to a chair (including a wheelchair)?: A Little Help needed standing up from a chair using your arms (e.g., wheelchair or bedside chair)?: None Help needed to walk in hospital room?: A Little Help needed climbing 3-5 steps with a railing? : A Little 6 Click Score: 21    End of Session Equipment Utilized During Treatment: Gait belt Activity Tolerance: Patient tolerated treatment well Patient left: in bed;with call bell/phone  within reach;with bed alarm set Nurse Communication: Mobility status PT Visit Diagnosis: Unsteadiness on feet (R26.81);Other abnormalities of gait and mobility (R26.89);Muscle weakness (generalized) (M62.81);History of falling (Z91.81);Other symptoms and signs involving the nervous system (J69.678)    Time: 9381-0175 PT Time Calculation (min) (ACUTE ONLY): 35 min   Charges:   PT Evaluation $PT Eval Moderate Complexity: 1 Mod PT Treatments $Gait Training: 8-22 mins        Charlsie Merles, PT, DPT  [place] Mount 01/24/2021, 9:36 AM

## 2021-01-24 NOTE — Progress Notes (Signed)
Occupational Therapy Evaluation Patient Details Name: Richard Andrews MRN: [id number] DOB: 1960/06/16 Today's Date: 01/24/2021    History of Present Illness 60 year old male with history of alcohol abuse, thrombocytopenia, macrocytic anemia admitted with a fall, recently  ED visit with diagnosis of L1 compression fracture. GI bleed, active delirium tremens.   Clinical Impression   Rishit was evaluated s/p the above fall and L1 compression fracture. PTA pt was indep in all ADL/IADLs. He lives in a 2 level home, ramped entrance with his wife and children. Upon evaluation pt recalled 0/3 back precautions and required max vc for log rolling technique; all precautions review and pt verbalized understanding. Compensatory techniques for lower body ADLs also reviewed and pt verbalized understanding. Pt required min guard A for all mobility with RW, and standing ADLs. Pt would benefit from continued OT to progress function back to his indep baseline. Recommend d/c home with supervision initially for all ADLs and mobility.     Follow Up Recommendations  No OT follow up;Supervision - Intermittent    Equipment Recommendations  Other (comment) (RW)       Precautions / Restrictions Precautions Precautions: Fall;Back Precaution Comments: Discussed precautions due to compression fracture. Not formally ordered Restrictions Weight Bearing Restrictions: No      Mobility Bed Mobility Overal bed mobility: Needs Assistance Bed Mobility: Rolling;Sidelying to Sit Rolling: Supervision Sidelying to sit: Supervision       General bed mobility comments: vc for log rolling + incrased time and use of bed rails    Transfers Overall transfer level: Needs assistance Equipment used: Rolling walker (2 wheeled) Transfers: Sit to/from Stand Sit to Stand: Min guard         General transfer comment: min guard, cueign for RW and posture    Balance Overall balance assessment: Needs assistance Sitting-balance  support: Bilateral upper extremity supported Sitting balance-Leahy Scale: Good   Postural control: Posterior lean Standing balance support: Single extremity supported;During functional activity Standing balance-Leahy Scale: Fair                             ADL either performed or assessed with clinical judgement   ADL Overall ADL's : Needs assistance/impaired Eating/Feeding: Independent;Sitting   Grooming: Min guard;Standing   Upper Body Bathing: Set up;Sitting   Lower Body Bathing: Set up;Sit to/from stand;Cueing for compensatory techniques;Cueing for back precautions   Upper Body Dressing : Set up;Sitting   Lower Body Dressing: Min guard;Sit to/from stand;Adhering to back precautions;Cueing for compensatory techniques   Toilet Transfer: Min guard;Ambulation;RW   Toileting- Clothing Manipulation and Hygiene: Supervision/safety;Sitting/lateral lean       Functional mobility during ADLs: Min guard;Rolling walker General ADL Comments: vc for compensatory techniques and back precautions, min guard throguhout for all sitanding and ambulation with RW for balacne and safety     Vision Baseline Vision/History: No visual deficits Patient Visual Report: No change from baseline Vision Assessment?: No apparent visual deficits            Pertinent Vitals/Pain Pain Assessment: No/denies pain     Hand Dominance  (Ambi)   Extremity/Trunk Assessment Upper Extremity Assessment Upper Extremity Assessment: Overall WFL for tasks assessed   Lower Extremity Assessment Lower Extremity Assessment: Defer to PT evaluation   Cervical / Trunk Assessment Cervical / Trunk Assessment: Normal   Communication Communication Communication: No difficulties   Cognition Arousal/Alertness: Awake/alert Behavior During Therapy: WFL for tasks assessed/performed Overall Cognitive Status: Within Functional Limits for tasks  assessed                 General Comments  no report of  dizziness this session, pt with very limited participation stating he wanted to stay in bed and wait for his wife. Encouraged pt to mobilize OOB several times throughout the day with help of nursing     Home Living Family/patient expects to be discharged to:: Private residence Living Arrangements: Spouse/significant other;Children Available Help at Discharge: Family;Available PRN/intermittently Type of Home: House Home Access: Ramped entrance     Home Layout: Two level;1/2 bath on main level Alternate Level Stairs-Number of Steps: flight Alternate Level Stairs-Rails: Right Bathroom Shower/Tub: Chief Strategy Officer: Standard     Home Equipment: None          Prior Functioning/Environment Level of Independence: Independent        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[employer]        OT Problem List: Decreased activity tolerance;Impaired balance (sitting and/or standing);Decreased safety awareness;Decreased knowledge of use of DME or AE;Decreased knowledge of precautions;Pain      OT Treatment/Interventions:      OT Goals(Current goals can be found in the care plan section) Acute Rehab OT Goals Patient Stated Goal: get back to work OT Goal Formulation: With patient Time For Goal Achievement: 02/07/21 Potential to Achieve Goals: Fair ADL Goals Pt Will Perform Lower Body Bathing: with modified independence;sit to/from stand Pt Will Perform Lower Body Dressing: with modified independence;sit to/from stand Pt Will Transfer to Toilet: with modified independence;ambulating Pt Will Perform Tub/Shower Transfer: with modified independence;rolling walker  OT Frequency: Min 2X/week    AM-PAC OT "6 Clicks" Daily Activity     Outcome Measure Help from another person eating meals?: None Help from another person taking care of personal grooming?: A Little Help from another person toileting, which includes using toliet, bedpan, or urinal?: A Little Help  from another person bathing (including washing, rinsing, drying)?: A Little Help from another person to put on and taking off regular upper body clothing?: None Help from another person to put on and taking off regular lower body clothing?: A Little 6 Click Score: 20   End of Session Equipment Utilized During Treatment: Gait belt;Rolling walker Nurse Communication: Mobility status;Precautions;Weight bearing status  Activity Tolerance: Patient tolerated treatment well Patient left: in bed;with call bell/phone within reach  OT Visit Diagnosis: Unsteadiness on feet (R26.81);Muscle weakness (generalized) (M62.81);Pain                Time: 1009-1020 OT Time Calculation (min): 11 min Charges:  OT General Charges $OT Visit: 1 Visit OT Evaluation $OT Eval Moderate Complexity: 1 Mod  Arlet Marter A Carreen Milius 01/24/2021, 10:49 AM

## 2021-01-25 LAB — CBC WITH DIFFERENTIAL/PLATELET
Abs Immature Granulocytes: 0.02 10*3/uL (ref 0.00–0.07)
Basophils Absolute: 0 10*3/uL (ref 0.0–0.1)
Basophils Relative: 0 %
Eosinophils Absolute: 0.2 10*3/uL (ref 0.0–0.5)
Eosinophils Relative: 5 %
HCT: 27 % — ABNORMAL LOW (ref 39.0–52.0)
Hemoglobin: 9.2 g/dL — ABNORMAL LOW (ref 13.0–17.0)
Immature Granulocytes: 1 %
Lymphocytes Relative: 34 %
Lymphs Abs: 1 10*3/uL (ref 0.7–4.0)
MCH: 35.2 pg — ABNORMAL HIGH (ref 26.0–34.0)
MCHC: 34.1 g/dL (ref 30.0–36.0)
MCV: 103.4 fL — ABNORMAL HIGH (ref 80.0–100.0)
Monocytes Absolute: 0.6 10*3/uL (ref 0.1–1.0)
Monocytes Relative: 20 %
Neutro Abs: 1.3 10*3/uL — ABNORMAL LOW (ref 1.7–7.7)
Neutrophils Relative %: 40 %
Platelets: 130 10*3/uL — ABNORMAL LOW (ref 150–400)
RBC: 2.61 MIL/uL — ABNORMAL LOW (ref 4.22–5.81)
RDW: 13.9 % (ref 11.5–15.5)
Smear Review: ADEQUATE
WBC: 3.1 10*3/uL — ABNORMAL LOW (ref 4.0–10.5)
nRBC: 0 % (ref 0.0–0.2)

## 2021-01-25 LAB — PHOSPHORUS: Phosphorus: 4 mg/dL (ref 2.5–4.6)

## 2021-01-25 LAB — MAGNESIUM: Magnesium: 1.8 mg/dL (ref 1.7–2.4)

## 2021-01-25 MED ORDER — THIAMINE MONONITRATE 100 MG PO TABS: 100.0000 mg | ORAL_TABLET | Freq: Every day | ORAL | 3 refills | Status: DC

## 2021-01-25 MED ORDER — FOLIC ACID 1 MG PO TABS: 1.0000 mg | ORAL_TABLET | Freq: Every day | ORAL | 1 refills | Status: DC

## 2021-01-25 MED ORDER — PANTOPRAZOLE SODIUM 40 MG PO TBEC: 40.0000 mg | DELAYED_RELEASE_TABLET | Freq: Two times a day (BID) | ORAL | 1 refills | Status: DC

## 2021-01-25 MED ORDER — SUCRALFATE 1 G PO TABS: 1.0000 g | ORAL_TABLET | Freq: Four times a day (QID) | ORAL | 0 refills | Status: DC

## 2021-01-25 NOTE — Discharge Summary (Signed)
Physician Discharge Summary  Richard Andrews JQZ:[id number] DOB: [birth year] DOA: 01/19/2021  PCP: [name], NP  Admit date: 01/19/2021 Discharge date: 01/25/2021  Admitted From:Home Disposition:  Home   Recommendations for Outpatient Follow-up:  Follow up with PCP in 1-2 weeks Please obtain BMP/CBC in one week Follow the final results of EGD biopsy   Home Health: Outpatient PT Equipment/Devices:none  Discharge Condition:Stable CODE STATUS:FULL Diet recommendation: Heart Healthy   Brief/Interim Summary: 60 year old male with history of alcohol abuse, thrombocytopenia, macrocytic anemia admitted with a fall, recently  ED visit with diagnosis of L1 compression fracture. Work-up in the ED showed a significant anemia melena Hemoccult positive stool. Patient also had alcohol withdrawal. Seen by GI EGD 8/2 multiple gastric ulcers and duodenal ulcers, clean-based, biopsied   Acute blood loss anemia due to GI bleed due to gastric/duodenal ulcers Status post 2 unit PRBC, H&H appropriately increased.  Status post vitamin K per GI due to coagulopathy.  Seen by GI and status post EGD 8/12 multiple gastric ulcers and duodenal ulcers, clean-based, biopsied.  Follow  final results as an outpatient, keep on PPI twice daily, Carafate. Last Labs          Recent Labs  Lab 01/22/21 0636 01/22/21 1744 01/23/21 0523 01/23/21 1809 01/24/21 0702  HGB 8.9* 9.3* 8.9* 9.7* 9.1*  HCT 25.3* 26.9* 25.5* 27.6* 26.1*      Fever episode overnight 8/12: Patient with alcohol withdrawals that could contribute.  Chest x-ray, urinalysis negative, patient is nontoxic-appearing, this is most likely in the setting of alcohol withdrawals.     Thrombocytopenia/pancytopenia with anemia leukopenia: Suspect due to alcohol abuse.  B12 improving   Alcohol abuse with active DTs: Seen by PCCM.  Managed with CIWA protocol.  At this time no withdrawal symptoms.  Continue thiamine, folate. Had episodes of tremors/?  Seizures  but EEG no acute findings likely from withdrawals. -Has resolved, no evidence of withdrawals at time of discharge   Essential hypertension: Blood pressures controlled without meds Hypokalemia-repleted  Hypomagnesemia-resolved QT prolongation: Avoid QT prolonging medication.   Discharge Diagnoses:  Principal Problem:   GI bleed Active Problems:   Essential hypertension   Increased ammonia level   Thrombocytopenia (HCC)   Macrocytic anemia   QT prolongation   Acute blood loss anemia   Alcohol withdrawal seizure with complication (HCC)   Alcohol withdrawal syndrome without complication (HCC)   Gastritis and gastroduodenitis   Multiple gastric ulcers   Duodenal ulcer    Discharge Instructions  Discharge Instructions     Diet - low sodium heart healthy   Complete by: As directed    Increase activity slowly   Complete by: As directed       Allergies as of 01/25/2021   No Known Allergies      Medication List     STOP taking these medications    ibuprofen 200 MG tablet Commonly known as: ADVIL       TAKE these medications    folic acid 1 MG tablet Commonly known as: FOLVITE Take 1 tablet (1 mg total) by mouth daily. Start taking on: January 26, 2021   pantoprazole 40 MG tablet Commonly known as: PROTONIX Take 1 tablet (40 mg total) by mouth 2 (two) times daily.   thiamine 100 MG tablet Commonly known as: VITAMIN B-1 Take 1 tablet (100 mg total) by mouth daily.   vitamin B-12 1000 MCG tablet Commonly known as: CYANOCOBALAMIN Take 1 tablet (1,000 mcg total) by mouth daily.   vitamin  C 1000 MG tablet Take 1,000 mg by mouth daily.        Follow-up Information     [name], NP Follow up.   Specialty: Nurse Practitioner Contact information: [address] [name] [address] [phone]                No Known Allergies  Consultations: GI PCCM   Procedures/Studies: DG Chest 1 View  Result Date:  01/19/2021 CLINICAL DATA:  Evaluate for pneumonia, recent fall EXAM: CHEST  1 VIEW COMPARISON:  None. FINDINGS: The heart size and mediastinal contours are within normal limits. Both lungs are clear. Remote right rib fractures. No acute osseous abnormality. IMPRESSION: No acute cardiac or pulmonary abnormality. Electronically Signed   By: Wiliam Ke MD   On: 01/19/2021 09:37   CT Head Wo Contrast  Result Date: 01/19/2021 CLINICAL DATA:  Head trauma, abnormal mental status (Age 50-64y). Facial trauma. Additional history provided: Fall this morning, abrasion to nose, slurred speech. EXAM: CT HEAD WITHOUT CONTRAST CT MAXILLOFACIAL WITHOUT CONTRAST TECHNIQUE: Multidetector CT imaging of the head and maxillofacial structures were performed using the standard protocol without intravenous contrast. Multiplanar CT image reconstructions of the maxillofacial structures were also generated. COMPARISON:  Head CT 01/11/2021. FINDINGS: CT HEAD FINDINGS Brain: Mild generalized cerebral atrophy. Mild patchy and ill-defined hypoattenuation within the cerebral white matter, nonspecific but compatible with chronic small vessel ischemic disease. Redemonstrated left middle cranial fossa arachnoid cyst, anterior to the left temporal lobe, measuring 4.4 x 3.3 cm in transaxial dimensions. There is no acute intracranial hemorrhage. No demarcated cortical infarct. No extra-axial fluid collection. No evidence of an intracranial mass. No midline shift. Vascular: No hyperdense vessel.  Atherosclerotic calcifications. Skull: Normal. Negative for fracture or focal lesion. CT MAXILLOFACIAL FINDINGS Osseous: No acute maxillofacial fracture is identified. Orbits: No acute finding within the orbits. The globes are normal in size and contour. The extraocular muscles and optic nerve sheath complexes are symmetric and unremarkable. Sinuses: Large left maxillary sinus mucous retention cyst. Small mucous retention cyst within a posterior left ethmoid  air cell. Mucosal thickening within an anterior right ethmoid air cell. Soft tissues: No significant maxillofacial soft tissue swelling is appreciable by CT. IMPRESSION: CT head: 1. No evidence of acute intracranial abnormality. 2. Mild generalized cerebral atrophy and cerebral white matter chronic small vessel ischemic disease. 3. Redemonstrated left middle cranial fossa arachnoid cyst. CT maxillofacial: 1. No evidence of acute maxillofacial fracture. 2. Paranasal sinus disease as described. Electronically Signed   By: Jackey Loge DO   On: 01/19/2021 10:12   CT Head Wo Contrast  Result Date: 01/11/2021 CLINICAL DATA:  Head trauma, abnormal mental status (Age 60-64y) EXAM: CT HEAD WITHOUT CONTRAST TECHNIQUE: Contiguous axial images were obtained from the base of the skull through the vertex without intravenous contrast. COMPARISON:  Brain MRI 12/26/2016 FINDINGS: Brain: Mild thickening of the posterior falx but no definite subdural hematoma or layering along the tentorium. No acute hemorrhage. Generalized atrophy. Stable arachnoid cyst in the left middle cranial fossa. No evidence of acute ischemia, mass lesion/mass mass effect, hydrocephalus, or midline shift. Vascular: No hyperdense vessel. Skull: No fracture or focal lesion. Sinuses/Orbits: Chronic mucous retention cyst in the left maxillary sinus. Occasional mucosal thickening of ethmoid air cells and right inferior frontal sinus. No mastoid effusion. Unremarkable orbits. Other: None. IMPRESSION: 1. No acute abnormality. There is mild posterior falx thickening but no definite subdural blood component. 2. Generalized atrophy. Stable arachnoid cyst in the  left middle cranial fossa. Electronically Signed   By: Narda Rutherford M.D.   On: 01/11/2021 00:47   CT Cervical Spine Wo Contrast  Result Date: 01/11/2021 CLINICAL DATA:  Neck trauma, intoxicated or obtunded (Age >= 16y) EXAM: CT CERVICAL SPINE WITHOUT CONTRAST TECHNIQUE: Multidetector CT imaging of the  cervical spine was performed without intravenous contrast. Multiplanar CT image reconstructions were also generated. COMPARISON:  None. FINDINGS: Alignment: Trace anterolisthesis of C3 on C4, likely facet mediated. No traumatic subluxation. Skull base and vertebrae: No acute fracture. Vertebral body heights are maintained. The dens and skull base are intact. Soft tissues and spinal canal: No prevertebral fluid or swelling. No visible canal hematoma. Disc levels: Mild degenerative disc disease with disc space narrowing and endplate spurring Z6-X0, C5-C6, and C6-C7. Multilevel facet hypertrophy. No high-grade canal stenosis. Upper chest: Biapical pleuroparenchymal scarring. No acute findings. Other: None. IMPRESSION: 1. No acute fracture or subluxation of the cervical spine. 2. Mild multilevel degenerative disc disease and facet hypertrophy. Electronically Signed   By: Narda Rutherford M.D.   On: 01/11/2021 00:50   CT Lumbar Spine Wo Contrast  Result Date: 01/11/2021 CLINICAL DATA:  Initial evaluation for low back pain status post trauma. EXAM: CT LUMBAR SPINE WITHOUT CONTRAST TECHNIQUE: Multidetector CT imaging of the lumbar spine was performed without intravenous contrast administration. Multiplanar CT image reconstructions were also generated. COMPARISON:  None. FINDINGS: Segmentation: Standard. Lowest well-formed disc space labeled the L5-S1 level. Alignment: Physiologic with preservation of the normal lumbar lordosis. No listhesis. Vertebrae: Acute compression fracture involving the L1 vertebral body is seen. Associated central height loss measures up to nearly 50% without significant bony retropulsion. Vertebral body height otherwise maintained. Visualized sacrum and pelvis intact. Small benign bone island noted within the left sacrum. No other discrete or worrisome osseous lesions. Paraspinal and other soft tissues: Scattered soft tissue stranding and edema seen adjacent to the L1 compression fracture,  extending along the left greater than right psoas musculature. Bilateral nonobstructive nephrolithiasis. Hepatic steatosis. Mild aortic atherosclerosis. Misty mesenteric stranding noted as well. Disc levels: L1-2: Mild diffuse disc bulge, eccentric to the right. No spinal stenosis. Foramina remain patent. L2-3: Mild disc bulge, eccentric to the right. Superimposed small right foraminal disc protrusion extends into the inferior right neural foramen (series 6, image 65). Mild facet hypertrophy. No significant spinal stenosis. Foramina remain patent. L3-4: Minimal disc bulge with mild facet hypertrophy. No canal or foraminal stenosis. L4-5:  Minimal disc bulge with mild facet hypertrophy.  No stenosis. L5-S1: Minimal disc bulge with mild facet hypertrophy. No stenosis. IMPRESSION: 1. Acute compression fracture involving the L1 vertebral body with up to nearly 50% central height loss without significant bony retropulsion. 2. No other acute traumatic injury within the lumbar spine. 3. Bilateral nonobstructive nephrolithiasis. 4. Hepatic steatosis. 5. Aortic Atherosclerosis (ICD10-I70.0). Electronically Signed   By: Rise Mu M.D.   On: 01/11/2021 00:54   CT CHEST ABDOMEN PELVIS W CONTRAST  Result Date: 01/19/2021 CLINICAL DATA:  60 year old male status post fall this morning. Acute L1 compression fracture secondary to a fall on 01/09/21-01/11/21. EXAM: CT CHEST, ABDOMEN, AND PELVIS WITH CONTRAST TECHNIQUE: Multidetector CT imaging of the chest, abdomen and pelvis was performed following the standard protocol during bolus administration of intravenous contrast. CONTRAST:  80mL OMNIPAQUE IOHEXOL 300 MG/ML  SOLN COMPARISON:  Lumbar spine CT 01/11/2021. FINDINGS: CT CHEST FINDINGS Cardiovascular: Normal thoracic aorta. Calcified coronary artery atherosclerosis on series 2, image 33. No cardiomegaly or pericardial effusion. Other central mediastinal vascular structures  appear intact. Mediastinum/Nodes: Negative, no  mediastinal hematoma or lymphadenopathy. Lungs/Pleura: Lung volumes appear normal. Major airways are patent. Minor calcified scarring in both lung apices. Minimal dependent atelectasis or scarring in both lungs, which otherwise appear clear. Musculoskeletal: There is moderate to severe anterior wedge compression fracture of the T6 vertebral body. Minimal retropulsion of the posteroinferior endplate. T6 pedicles and posterior elements appear intact. This fracture is age indeterminate, although absence of paraspinal soft tissue swelling and lucent fracture line suggest this is chronic. Other thoracic vertebrae are intact. Intact sternum. Visible shoulder osseous structures are intact. Chronic right lateral (6 through 8) rib fractures. Chronic right posterior 10th and 11th rib fractures. Chronic left 9th through 11th rib fractures. No acute rib fracture identified. CT ABDOMEN PELVIS FINDINGS Hepatobiliary: Hepatic steatosis. Otherwise negative liver and gallbladder. Pancreas: Negative. Spleen: Negative; subtle low-density along some of the splenic capsule (series 5, image 36 and series 2, image 56) could be trace subcapsular hematoma. This appears unchanged on the delayed images. But otherwise the spleen appears intact. No perisplenic fluid. Incidental splenule (normal variant). Adrenals/Urinary Tract: Normal adrenal glands. Nonobstructed kidneys appear intact. Bilateral nephrolithiasis measuring 5-6 mm. Symmetric renal enhancement and contrast excretion. Normal ureters. Incidental pelvic phleboliths. Unremarkable bladder. Stomach/Bowel: Negative large bowel aside from sigmoid diverticulosis. Normal appendix on series 2, image 100. Negative terminal ileum. No dilated small bowel. Negative stomach and duodenum. No free air, free fluid. Vascular/Lymphatic: Mild Calcified aortic atherosclerosis. Major arterial structures appear patent and intact. Portal venous system is patent. No lymphadenopathy. Reproductive: Negative.  Other: No pelvic free fluid. Musculoskeletal: Comminuted central L1 vertebral body fracture with progressed and now severe central loss of vertebral body height (series 6, image 64). Minimal retropulsion. L1 pedicles and posterior elements remain intact. Lucent fracture planes remain visible on series 4, image 158. But there is no significant paraspinal soft tissue swelling. Other lumbar levels appear stable and intact. Sacrum and SI joints appear stable and intact. Pelvis and proximal femurs appear intact. IMPRESSION: 1. Progressed L1 compression fracture since 01/11/2021 now with severe central loss of vertebral body height. Minimal retropulsion, and no other complicating features. 2. Age indeterminate but probably chronic wedge compression fracture of T6. Multiple bilateral chronic rib fractures. 3. Possible trace Subcapsular Hematoma of the Spleen, which otherwise appears intact. No hemoperitoneum. 4. No other acute traumatic injury identified in the chest, abdomen, or pelvis. 5. Hepatic steatosis. Bilateral nephrolithiasis. Sigmoid diverticulosis. Calcified coronary artery atherosclerosis. Electronically Signed   By: Odessa FlemingH  Hall M.D.   On: 01/19/2021 11:38   DG Chest Port 1 View  Result Date: 01/23/2021 CLINICAL DATA:  Evaluate pneumonia.  EGD January 22, 2021. EXAM: PORTABLE CHEST 1 VIEW COMPARISON:  January 19, 2021 FINDINGS: The heart size and mediastinal contours are within normal limits. Both lungs are clear. The visualized skeletal structures are unremarkable. IMPRESSION: No active disease. Electronically Signed   By: Gerome Samavid  Williams III M.D.   On: 01/23/2021 14:32   EEG adult  Result Date: 01/21/2021 Charlsie QuestYadav, Priyanka O, MD     01/21/2021 11:25 AM Patient Name: Richard DuralLarry F Knies MRN: [id number] Epilepsy Attending: Charlsie QuestPriyanka O Yadav Referring Physician/Provider: Dr Dow Adolpharole Hall Date: 01/21/2021 Duration: 26.22 mins Patient history: 60 year old male with seizure-like activity.  EEG to evaluate for seizures. Level of  alertness: Awake, asleep AEDs during EEG study: Ativan Technical aspects: This EEG study was done with scalp electrodes positioned according to the 10-20 International system of electrode placement. Electrical activity was acquired at a sampling rate of 500Hz  and  reviewed with a high frequency filter of  and a low frequency filter of . EEG data were recorded continuously and digitally stored. Description: The posterior dominant rhythm consists of 9 Hz activity of moderate voltage (25-35 uV) seen predominantly in posterior head regions, symmetric and reactive to eye opening and eye closing. Sleep was characterized by vertex waves, sleep spindles (12 to 14 Hz), maximal frontocentral region.  There is an excessive amount of 15 to 18 HzV beta activity distributed symmetrically and diffusely. Physiologic photic driving was seen during photic stimulation. Hyperventilation was not performed.   ABNORMALITY - Excessive beta, generalized IMPRESSION: This study is within normal limits. No seizures or epileptiform discharges were seen throughout the recording. The excessive beta activity seen in the background is most likely due to the effect of benzodiazepine and is a benign EEG pattern. Charlsie Quest   ECHOCARDIOGRAM COMPLETE  Result Date: 01/20/2021    ECHOCARDIOGRAM REPORT   Patient Name:   Richard Andrews Date of Exam: 01/19/2021 Medical Rec #:  [id number]     Height:       72.0 in Accession #:    [id number]    Weight:       135.7 lb Date of Birth:  Jan 27, 1961     BSA:          1.807 m Patient Age:    60 years      BP:           123/82 mmHg Patient Gender: M             HR:           86 bpm. Exam Location:  Inpatient Procedure: 2D Echo, Color Doppler and Cardiac Doppler Indications:    CHF  History:        Patient has no prior history of Echocardiogram examinations.                 Risk Factors:Current Smoker. ETOH abuse suffered two falls.                 Broke vertebrae.  Sonographer:    Roosvelt Maser RDCS  Referring Phys: [id number] CURTIS J WOODS IMPRESSIONS  1. Left ventricular ejection fraction, by estimation, is 60 to 65%. The left ventricle has normal function. The left ventricle has no regional wall motion abnormalities. Left ventricular diastolic parameters are consistent with Grade I diastolic dysfunction (impaired relaxation).  2. Right ventricular systolic function is normal. The right ventricular size is normal.  3. The mitral valve is normal in structure. No evidence of mitral valve regurgitation. No evidence of mitral stenosis.  4. The aortic valve is normal in structure. Aortic valve regurgitation is not visualized. No aortic stenosis is present.  5. The inferior vena cava is normal in size with greater than 50% respiratory variability, suggesting right atrial pressure of 3 mmHg. FINDINGS  Left Ventricle: Left ventricular ejection fraction, by estimation, is 60 to 65%. The left ventricle has normal function. The left ventricle has no regional wall motion abnormalities. The left ventricular internal cavity size was normal in size. There is  no left ventricular hypertrophy. Left ventricular diastolic parameters are consistent with Grade I diastolic dysfunction (impaired relaxation). Right Ventricle: The right ventricular size is normal. No increase in right ventricular wall thickness. Right ventricular systolic function is normal. Left Atrium: Left atrial size was normal in size. Right Atrium: Right atrial size was normal in size. Pericardium: There is no evidence of pericardial effusion. Mitral Valve:  The mitral valve is normal in structure. No evidence of mitral valve regurgitation. No evidence of mitral valve stenosis. Tricuspid Valve: The tricuspid valve is normal in structure. Tricuspid valve regurgitation is trivial. No evidence of tricuspid stenosis. Aortic Valve: The aortic valve is normal in structure. Aortic valve regurgitation is not visualized. No aortic stenosis is present. Aortic valve mean  gradient measures 2.0 mmHg. Aortic valve peak gradient measures 4.6 mmHg. Aortic valve area, by VTI measures 3.03 cm. Pulmonic Valve: The pulmonic valve was normal in structure. Pulmonic valve regurgitation is not visualized. No evidence of pulmonic stenosis. Aorta: The aortic root is normal in size and structure. Venous: The inferior vena cava is normal in size with greater than 50% respiratory variability, suggesting right atrial pressure of 3 mmHg. IAS/Shunts: No atrial level shunt detected by color flow Doppler.  LEFT VENTRICLE PLAX 2D LVIDd:         4.80 cm  Diastology LVIDs:         3.10 cm  LV e' medial:    7.51 cm/s LV PW:         0.90 cm  LV E/e' medial:  7.2 LV IVS:        0.90 cm  LV e' lateral:   9.68 cm/s LVOT diam:     2.10 cm  LV E/e' lateral: 5.6 LV SV:         48 LV SV Index:   26 LVOT Area:     3.46 cm  LEFT ATRIUM             Index LA diam:        2.90 cm 1.61 cm/m LA Vol (A2C):   25.9 ml 14.33 ml/m LA Vol (A4C):   25.7 ml 14.22 ml/m LA Biplane Vol: 27.3 ml 15.11 ml/m  AORTIC VALVE AV Area (Vmax):    3.15 cm AV Area (Vmean):   3.05 cm AV Area (VTI):     3.03 cm AV Vmax:           107.00 cm/s AV Vmean:          70.600 cm/s AV VTI:            0.158 m AV Peak Grad:      4.6 mmHg AV Mean Grad:      2.0 mmHg LVOT Vmax:         97.30 cm/s LVOT Vmean:        62.100 cm/s LVOT VTI:          0.138 m LVOT/AV VTI ratio: 0.87  AORTA Ao Root diam: 2.70 cm MITRAL VALVE MV Area (PHT): 3.89 cm    SHUNTS MV Decel Time: 195 msec    Systemic VTI:  0.14 m MV E velocity: 54.40 cm/s  Systemic Diam: 2.10 cm MV A velocity: 67.70 cm/s MV E/A ratio:  0.80 Donato Schultz MD Electronically signed by Donato Schultz MD Signature Date/Time: 01/20/2021/6:17:44 AM    Final    CT Maxillofacial WO CM  Result Date: 01/19/2021 CLINICAL DATA:  Head trauma, abnormal mental status (Age 37-64y). Facial trauma. Additional history provided: Fall this morning, abrasion to nose, slurred speech. EXAM: CT HEAD WITHOUT CONTRAST CT  MAXILLOFACIAL WITHOUT CONTRAST TECHNIQUE: Multidetector CT imaging of the head and maxillofacial structures were performed using the standard protocol without intravenous contrast. Multiplanar CT image reconstructions of the maxillofacial structures were also generated. COMPARISON:  Head CT 01/11/2021. FINDINGS: CT HEAD FINDINGS Brain: Mild generalized cerebral atrophy. Mild patchy and ill-defined hypoattenuation  within the cerebral white matter, nonspecific but compatible with chronic small vessel ischemic disease. Redemonstrated left middle cranial fossa arachnoid cyst, anterior to the left temporal lobe, measuring 4.4 x 3.3 cm in transaxial dimensions. There is no acute intracranial hemorrhage. No demarcated cortical infarct. No extra-axial fluid collection. No evidence of an intracranial mass. No midline shift. Vascular: No hyperdense vessel.  Atherosclerotic calcifications. Skull: Normal. Negative for fracture or focal lesion. CT MAXILLOFACIAL FINDINGS Osseous: No acute maxillofacial fracture is identified. Orbits: No acute finding within the orbits. The globes are normal in size and contour. The extraocular muscles and optic nerve sheath complexes are symmetric and unremarkable. Sinuses: Large left maxillary sinus mucous retention cyst. Small mucous retention cyst within a posterior left ethmoid air cell. Mucosal thickening within an anterior right ethmoid air cell. Soft tissues: No significant maxillofacial soft tissue swelling is appreciable by CT. IMPRESSION: CT head: 1. No evidence of acute intracranial abnormality. 2. Mild generalized cerebral atrophy and cerebral white matter chronic small vessel ischemic disease. 3. Redemonstrated left middle cranial fossa arachnoid cyst. CT maxillofacial: 1. No evidence of acute maxillofacial fracture. 2. Paranasal sinus disease as described. Electronically Signed   By: Jackey Loge DO   On: 01/19/2021 10:12    Admit Type: Inpatient Procedure:                Upper  GI endoscopy Indications:              Acute post hemorrhagic anemia, Melena                           60 yo male with EtOH use disorder presents after                            fall and noted to have anemia, melena on exam, and                            FOBT+ stools. Good response to pRBC transfusion.                            Hospital course complicated by EtOH withdrawal. Providers:                Doristine Locks, MD, Vicki Mallet, RN, Brion Aliment, Technician Referring MD:              Medicines:                Monitored Anesthesia Care Complications:            No immediate complications. Estimated Blood Loss:     Estimated blood loss was minimal. Procedure:                Pre-Anesthesia Assessment:                           - Prior to the procedure, a History and Physical                            was performed, and patient medications and  allergies were reviewed. The patient's tolerance of                            previous anesthesia was also reviewed. The risks                            and benefits of the procedure and the sedation                            options and risks were discussed with the patient.                            All questions were answered, and informed consent                            was obtained. Prior Anticoagulants: The patient has                            taken no previous anticoagulant or antiplatelet                            agents. ASA Grade Assessment: II - A patient with                            mild systemic disease. After reviewing the risks                            and benefits, the patient was deemed in                            satisfactory condition to undergo the procedure.                           After obtaining informed consent, the endoscope was                            passed under direct vision. Throughout the                            procedure, the  patient's blood pressure, pulse, and                            oxygen saturations were monitored continuously. The                            [id number]-[id number] ([id number]) Olympus endoscope was introduced                            through the mouth, and advanced to the second part                            of duodenum. The upper GI endoscopy was  accomplished without difficulty. The patient                            tolerated the procedure well. Scope In: Scope Out: Findings:      The examined esophagus was normal.      Multiple non-bleeding cratered and superficial gastric ulcers with no       stigmata of bleeding were found in the gastric antrum. The largest       lesion was 5 mm in largest dimension. Biopsies were taken with a cold       forceps for histology and Helicobacter pylori testing. Estimated blood       loss was minimal.      Mild inflammation characterized by congestion (edema) and erythema was       found in the gastric fundus and in the gastric body. Biopsies were taken       with a cold forceps for Helicobacter pylori testing. Estimated blood       loss was minimal.      Two non-bleeding superficial duodenal ulcers with no stigmata of       bleeding were found in the duodenal bulb. The largest lesion was 5 mm in       largest dimension.      The second portion of the duodenum was normal. Impression:               - Normal esophagus.                           - Non-bleeding gastric ulcers with no stigmata of                            bleeding. Biopsied.                           - Gastritis. Biopsied.                           - Non-bleeding duodenal ulcers with no stigmata of                            bleeding.                           - Normal second portion of the duodenum. Recommendation:           - Return patient to hospital ward for ongoing care.                           - Full liquid diet now then advance as tolerated                             per Hospitalist service.                           - Use Protonix (pantoprazole) 40 mg PO BID for 8                            weeks, then reduce to 40 mg daily and  titrate off                            if no further need for long term PPI.                           - Use sucralfate suspension 1 gram PO QID for 4                            weeks.                           - Await pathology results.                           - Can stop Octreotide.                           - Repeat CBC 7-10 days after hospital discharge to                            ensure appropriate rise.                           - I discussed these results with his wife by phone.                           - Please do not hesitate to contact the inpatient                            GI service with additional questions or concerns. Procedure Code(s):        --- Professional ---                           [phone], Esophagogastroduodenoscopy, flexible,                            transoral; with biopsy, single or multiple Diagnosis Code(s):        --- Professional ---                           K25.9, Gastric ulcer, unspecified as acute or                            chronic, without hemorrhage or perforation                           K29.70, Gastritis, unspecified, without bleeding                           K26.9, Duodenal ulcer, unspecified as acute or                            chronic, without hemorrhage or perforation  D62, Acute posthemorrhagic anemia                           K92.1, Melena (includes Hematochezia) CPT copyright 2019 American Medical Association. All rights reserved. The codes documented in this report are preliminary and upon coder review may  be revised to meet current compliance requirements. Doristine Locks, MD 01/22/2021 1:09:27 PM Number of Addenda:   Subjective: Reports good appetite, no nausea, no vomiting, as discussed with staff no further evidence of  withdrawals.  Discharge Exam: Vitals:   01/25/21 0411 01/25/21 0800  BP: 115/84 132/83  Pulse: 62 63  Resp: 16 18  Temp: 98.6 F (37 C) (!) 97.2 F (36.2 C)  SpO2: 96% 99%   Vitals:   01/24/21 1938 01/24/21 2340 01/25/21 0411 01/25/21 0800  BP: 120/87 117/85 115/84 132/83  Pulse: 76 60 62 63  Resp: 14 15 16 18   Temp: 99.2 F (37.3 C) 98.6 F (37 C) 98.6 F (37 C) (!) 97.2 F (36.2 C)  TempSrc: Oral Oral Oral Oral  SpO2: 99% 95% 96% 99%  Weight:      Height:        General: Pt is alert, awake, not in acute distress, no tremors, appropriate mentation and insight Cardiovascular: RRR, S1/S2 +, no rubs, no gallops Respiratory: CTA bilaterally, no wheezing, no rhonchi Abdominal: Soft, NT, ND, bowel sounds + Extremities: no edema, no cyanosis    The results of significant diagnostics from this hospitalization (including imaging, microbiology, ancillary and laboratory) are listed below for reference.     Microbiology: Recent Results (from the past 240 hour(s))  Resp Panel by RT-PCR (Flu A&B, Covid) Nasopharyngeal Swab     Status: None   Collection Time: 01/19/21  9:03 AM   Specimen: Nasopharyngeal Swab; Nasopharyngeal(NP) swabs in vial transport medium  Result Value Ref Range Status   SARS Coronavirus 2 by RT PCR NEGATIVE NEGATIVE Final    Comment: (NOTE) SARS-CoV-2 target nucleic acids are NOT DETECTED.  The SARS-CoV-2 RNA is generally detectable in upper respiratory specimens during the acute phase of infection. The lowest concentration of SARS-CoV-2 viral copies this assay can detect is 138 copies/mL. A negative result does not preclude SARS-Cov-2 infection and should not be used as the sole basis for treatment or other patient management decisions. A negative result may occur with  improper specimen collection/handling, submission of specimen other than nasopharyngeal swab, presence of viral mutation(s) within the areas targeted by this assay, and inadequate  number of viral copies(<138 copies/mL). A negative result must be combined with clinical observations, patient history, and epidemiological information. The expected result is Negative.  Fact Sheet for Patients:  03/21/21  Fact Sheet for Healthcare Providers:  BloggerCourse.com  This test is no t yet approved or cleared by the [place] FDA and  has been authorized for detection and/or diagnosis of SARS-CoV-2 by FDA under an Emergency Use Authorization (EUA). This EUA will remain  in effect (meaning this test can be used) for the duration of the COVID-19 declaration under Section 564(b)(1) of the Act, 21 U.S.C.section 360bbb-3(b)(1), unless the authorization is terminated  or revoked sooner.       Influenza A by PCR NEGATIVE NEGATIVE Final   Influenza B by PCR NEGATIVE NEGATIVE Final    Comment: (NOTE) The Xpert Xpress SARS-CoV-2/FLU/RSV plus assay is intended as an aid in the diagnosis of influenza from Nasopharyngeal swab specimens and should not be used as a sole basis  for treatment. Nasal washings and aspirates are unacceptable for Xpert Xpress SARS-CoV-2/FLU/RSV testing.  Fact Sheet for Patients: BloggerCourse.com  Fact Sheet for Healthcare Providers: SeriousBroker.it  This test is not yet approved or cleared by the [place] FDA and has been authorized for detection and/or diagnosis of SARS-CoV-2 by FDA under an Emergency Use Authorization (EUA). This EUA will remain in effect (meaning this test can be used) for the duration of the COVID-19 declaration under Section 564(b)(1) of the Act, 21 U.S.C. section 360bbb-3(b)(1), unless the authorization is terminated or revoked.  Performed at [facility], [address], [place], [name] [address]      Labs: BNP (last 3 results) No results for input(s): BNP in the last 8760  hours. Basic Metabolic Panel: Recent Labs  Lab 01/20/21 1207 01/21/21 0621 01/22/21 0636 01/23/21 0523 01/24/21 0702 01/25/21 0706  NA 138 135 136 134* 136  --   K 3.8 4.4 3.5 3.3* 3.6  --   CL 109 106 100 101 103  --   CO2 21* --   GLUCOSE 97 127* 123* 106* 101*  --   BUN 7* 6*  --   CREATININE 0.98 0.83 0.92 0.96 0.97  --   CALCIUM 8.2* 8.3* 8.6* 8.3* 8.5*  --   MG 2.0 1.8 1.6* 1.8 1.7 1.8  PHOS 2.4* 3.3 4.6 3.8 3.2 4.0   Liver Function Tests: Recent Labs  Lab 01/20/21 1207 01/21/21 0621 01/22/21 0636 01/23/21 0523 01/24/21 0702  AST 72* 86* 62* 45* 41  ALT 42 52* 46* 39 35  ALKPHOS 60 59 74 77 93  BILITOT 1.3* 1.4* 1.7* 2.2* 1.1  PROT 6.0* 5.4* 5.7* 5.6* 5.8*  ALBUMIN 3.2* 3.0* 2.9* 2.8* 2.8*   Recent Labs  Lab 01/19/21 0920  LIPASE 49   Recent Labs  Lab 01/19/21 1756  AMMONIA 23   CBC: Recent Labs  Lab 01/21/21 0621 01/21/21 1715 01/22/21 0636 01/22/21 1744 01/23/21 0523 01/23/21 1809 01/24/21 0702 01/24/21 1823 01/25/21 0706  WBC 2.8*   < > 3.2*   < > 2.7* 3.3* 2.8* 3.7* 3.1*  NEUTROABS 1.7  --  1.7  --  1.4*  --  1.1*  --  PENDING  HGB 8.4*   < > 8.9*   < > 8.9* 9.7* 9.1* 9.8* 9.2*  HCT 23.9*   < > 25.3*   < > 25.5* 27.6* 26.1* 28.2* 27.0*  MCV 101.7*   < > 100.8*   < > 101.6* 101.1* 102.4* 101.8* 103.4*  PLT 78*   < > 91*   < > 87* 107* 109* 128* 130*   < > = values in this interval not displayed.   Cardiac Enzymes: No results for input(s): CKTOTAL, CKMB, CKMBINDEX, TROPONINI in the last 168 hours. BNP: Invalid input(s): POCBNP CBG: No results for input(s): GLUCAP in the last 168 hours. D-Dimer No results for input(s): DDIMER in the last 72 hours. Hgb A1c No results for input(s): HGBA1C in the last 72 hours. Lipid Profile No results for input(s): CHOL, HDL, LDLCALC, TRIG, CHOLHDL, LDLDIRECT in the last 72 hours. Thyroid function studies No results for input(s): TSH, T4TOTAL, T3FREE, THYROIDAB in the last 72  hours.  Invalid input(s): FREET3 Anemia work up Recent Labs    01/24/21 0757  VITAMINB12 985*   Urinalysis    Component Value Date/Time   COLORURINE YELLOW 01/23/2021 0823   APPEARANCEUR CLEAR 01/23/2021 0823   LABSPEC 1.004 (L) 01/23/2021 1914  PHURINE 7.0 01/23/2021 0823   GLUCOSEU NEGATIVE 01/23/2021 0823   HGBUR NEGATIVE 01/23/2021 0823   BILIRUBINUR NEGATIVE 01/23/2021 0823   KETONESUR NEGATIVE 01/23/2021 0823   PROTEINUR NEGATIVE 01/23/2021 0823   NITRITE NEGATIVE 01/23/2021 0823   LEUKOCYTESUR NEGATIVE 01/23/2021 0823   Sepsis Labs Invalid input(s): PROCALCITONIN,  WBC,  LACTICIDVEN Microbiology Recent Results (from the past 240 hour(s))  Resp Panel by RT-PCR (Flu A&B, Covid) Nasopharyngeal Swab     Status: None   Collection Time: 01/19/21  9:03 AM   Specimen: Nasopharyngeal Swab; Nasopharyngeal(NP) swabs in vial transport medium  Result Value Ref Range Status   SARS Coronavirus 2 by RT PCR NEGATIVE NEGATIVE Final    Comment: (NOTE) SARS-CoV-2 target nucleic acids are NOT DETECTED.  The SARS-CoV-2 RNA is generally detectable in upper respiratory specimens during the acute phase of infection. The lowest concentration of SARS-CoV-2 viral copies this assay can detect is 138 copies/mL. A negative result does not preclude SARS-Cov-2 infection and should not be used as the sole basis for treatment or other patient management decisions. A negative result may occur with  improper specimen collection/handling, submission of specimen other than nasopharyngeal swab, presence of viral mutation(s) within the areas targeted by this assay, and inadequate number of viral copies(<138 copies/mL). A negative result must be combined with clinical observations, patient history, and epidemiological information. The expected result is Negative.  Fact Sheet for Patients:  BloggerCourse.com  Fact Sheet for Healthcare Providers:   [employer]ker[employer]it  This test is no t yet approved or cleared by the [place] FDA and  has been authorized for detection and/or diagnosis of SARS-CoV-2 by FDA under an Emergency Use Authorization (EUA). This EUA will remain  in effect (meaning this test can be used) for the duration of the COVID-19 declaration under Section 564(b)(1) of the Act, 21 U.S.C.section 360bbb-3(b)(1), unless the authorization is terminated  or revoked sooner.       Influenza A by PCR NEGATIVE NEGATIVE Final   Influenza B by PCR NEGATIVE NEGATIVE Final    Comment: (NOTE) The Xpert Xpress SARS-CoV-2/FLU/RSV plus assay is intended as an aid in the diagnosis of influenza from Nasopharyngeal swab specimens and should not be used as a sole basis for treatment. Nasal washings and aspirates are unacceptable for Xpert Xpress SARS-CoV-2/FLU/RSV testing.  Fact Sheet for Patients: BloggerCourse.com  Fact Sheet for Healthcare Providers: SeriousBroker.it  This test is not yet approved or cleared by the [place] FDA and has been authorized for detection and/or diagnosis of SARS-CoV-2 by FDA under an Emergency Use Authorization (EUA). This EUA will remain in effect (meaning this test can be used) for the duration of the COVID-19 declaration under Section 564(b)(1) of the Act, 21 U.S.C. section 360bbb-3(b)(1), unless the authorization is terminated or revoked.  Performed at [facility], [address], [place], [name] [address]      Time coordinating discharge: Over 30 minutes  SIGNED:   Huey Bienenstock, MD  [facility] 01/25/2021, 11:06 AM Pager   If 7PM-7AM, please contact night-coverage [email or web address] Password [email or web address]

## 2021-01-25 NOTE — Discharge Instructions (Signed)
Follow with Primary MD [name], NP in 7 days   Get CBC, CMP, checked  by Primary MD next visit.    Activity: As tolerated with Full fall precautions use walker/cane & assistance as needed   Disposition Home    Diet: Heart Healthy , with feeding assistance and aspiration precautions.  For Heart failure patients - Check your Weight same time everyday, if you gain over 2 pounds, or you develop in leg swelling, experience more shortness of breath or chest pain, call your Primary MD immediately. Follow Cardiac Low Salt Diet and 1.5 lit/day fluid restriction.   On your next visit with your primary care physician please Get Medicines reviewed and adjusted.   Please request your Prim.MD to go over all Hospital Tests and Procedure/Radiological results at the follow up, please get all Hospital records sent to your Prim MD by signing hospital release before you go home.   If you experience worsening of your admission symptoms, develop shortness of breath, life threatening emergency, suicidal or homicidal thoughts you must seek medical attention immediately by calling 911 or calling your MD immediately  if symptoms less severe.  You Must read complete instructions/literature along with all the possible adverse reactions/side effects for all the Medicines you take and that have been prescribed to you. Take any new Medicines after you have completely understood and accpet all the possible adverse reactions/side effects.   Do not drive, operating heavy machinery, perform activities at heights, swimming or participation in water activities or provide baby sitting services if your were admitted for syncope or siezures until you have seen by Primary MD or a Neurologist and advised to do so again.  Do not drive when taking Pain medications.    Do not take more than prescribed Pain, Sleep and Anxiety Medications  Special Instructions: If you have smoked or chewed Tobacco  in the last 2 yrs please  stop smoking, stop any regular Alcohol  and or any Recreational drug use.  Wear Seat belts while driving.   Please note  You were cared for by a hospitalist during your hospital stay. If you have any questions about your discharge medications or the care you received while you were in the hospital after you are discharged, you can call the unit and asked to speak with the hospitalist on call if the hospitalist that took care of you is not available. Once you are discharged, your primary care physician will handle any further medical issues. Please note that NO REFILLS for any discharge medications will be authorized once you are discharged, as it is imperative that you return to your primary care physician (or establish a relationship with a primary care physician if you do not have one) for your aftercare needs so that they can reassess your need for medications and monitor your lab values.

## 2021-01-25 NOTE — Plan of Care (Signed)
  Problem: Education: Goal: Knowledge of General Education information will improve Description: Including pain rating scale, medication(s)/side effects and non-pharmacologic comfort measures 01/25/2021 1100 by Durward Fortes, RN Outcome: Progressing 01/25/2021 0817 by Durward Fortes, RN Outcome: Progressing   Problem: Health Behavior/Discharge Planning: Goal: Ability to manage health-related needs will improve 01/25/2021 1100 by Durward Fortes, RN Outcome: Progressing 01/25/2021 0817 by Durward Fortes, RN Outcome: Progressing   Problem: Clinical Measurements: Goal: Ability to maintain clinical measurements within normal limits will improve 01/25/2021 1100 by Durward Fortes, RN Outcome: Progressing 01/25/2021 0817 by Durward Fortes, RN Outcome: Progressing Goal: Will remain free from infection 01/25/2021 1100 by Durward Fortes, RN Outcome: Progressing 01/25/2021 0817 by Durward Fortes, RN Outcome: Progressing Goal: Diagnostic test results will improve 01/25/2021 1100 by Durward Fortes, RN Outcome: Progressing 01/25/2021 0817 by Durward Fortes, RN Outcome: Progressing Goal: Respiratory complications will improve 01/25/2021 1100 by Durward Fortes, RN Outcome: Progressing 01/25/2021 0817 by Durward Fortes, RN Outcome: Progressing Goal: Cardiovascular complication will be avoided 01/25/2021 1100 by Durward Fortes, RN Outcome: Progressing 01/25/2021 0817 by Durward Fortes, RN Outcome: Progressing   Problem: Activity: Goal: Risk for activity intolerance will decrease 01/25/2021 1100 by Durward Fortes, RN Outcome: Progressing 01/25/2021 0817 by Durward Fortes, RN Outcome: Progressing   Problem: Nutrition: Goal: Adequate nutrition will be maintained 01/25/2021 1100 by Durward Fortes, RN Outcome: Progressing 01/25/2021 0817 by Durward Fortes, RN Outcome: Progressing   Problem: Coping: Goal: Level of anxiety will decrease 01/25/2021 1100 by Durward Fortes, RN Outcome:  Progressing 01/25/2021 0817 by Durward Fortes, RN Outcome: Progressing   Problem: Elimination: Goal: Will not experience complications related to bowel motility 01/25/2021 1100 by Durward Fortes, RN Outcome: Progressing 01/25/2021 0817 by Durward Fortes, RN Outcome: Progressing Goal: Will not experience complications related to urinary retention 01/25/2021 1100 by Durward Fortes, RN Outcome: Progressing 01/25/2021 0817 by Durward Fortes, RN Outcome: Progressing   Problem: Pain Managment: Goal: General experience of comfort will improve 01/25/2021 1100 by Durward Fortes, RN Outcome: Progressing 01/25/2021 0817 by Durward Fortes, RN Outcome: Progressing   Problem: Safety: Goal: Ability to remain free from injury will improve 01/25/2021 1100 by Durward Fortes, RN Outcome: Progressing 01/25/2021 0817 by Durward Fortes, RN Outcome: Progressing   Problem: Skin Integrity: Goal: Risk for impaired skin integrity will decrease 01/25/2021 1100 by Durward Fortes, RN Outcome: Progressing 01/25/2021 0817 by Durward Fortes, RN Outcome: Progressing   Problem: Education: Goal: Ability to identify signs and symptoms of gastrointestinal bleeding will improve 01/25/2021 1100 by Durward Fortes, RN Outcome: Progressing 01/25/2021 0817 by Durward Fortes, RN Outcome: Progressing   Problem: Bowel/Gastric: Goal: Will show no signs and symptoms of gastrointestinal bleeding 01/25/2021 1100 by Durward Fortes, RN Outcome: Progressing 01/25/2021 0817 by Durward Fortes, RN Outcome: Progressing   Problem: Fluid Volume: Goal: Will show no signs and symptoms of excessive bleeding 01/25/2021 1100 by Durward Fortes, RN Outcome: Progressing 01/25/2021 0817 by Durward Fortes, RN Outcome: Progressing   Problem: Clinical Measurements: Goal: Complications related to the disease process, condition or treatment will be avoided or minimized 01/25/2021 1100 by Durward Fortes, RN Outcome: Progressing 01/25/2021 0817 by  Durward Fortes, RN Outcome: Progressing

## 2021-01-25 NOTE — TOC Transition Note (Signed)
Transition of Care [facility]) - CM/SW Discharge Note   Patient Details  Name: Richard Andrews MRN: [id number] Date of Birth: September 04, 1960  [facility] CM/SW Contact:  Leone Haven, RN Phone Number: 01/25/2021, 2:11 PM   Clinical Narrative:    NCM spoke with patient , he would like to do outpatient therapy in [place].  NCM put on AVS.  Scheduled thru epic.   Final next level of care: Home/Self Care Barriers to Discharge: No Barriers Identified   Patient Goals and CMS Choice Patient states their goals for this hospitalization and ongoing recovery are:: return home   Choice offered to / list presented to : NA  Discharge Placement                       Discharge Plan and Services                  DME Agency: NA       HH Arranged: NA          Social Determinants of Health (SDOH) Interventions     Readmission Risk Interventions No flowsheet data found.

## 2021-01-25 NOTE — Plan of Care (Signed)

## 2021-01-26 ENCOUNTER — Encounter (HOSPITAL_COMMUNITY): Payer: Self-pay | Admitting: Gastroenterology

## 2021-01-26 LAB — SURGICAL PATHOLOGY

## 2021-01-27 ENCOUNTER — Telehealth: Payer: Self-pay | Admitting: General Practice

## 2021-01-27 NOTE — Telephone Encounter (Signed)
Transition Care Management Follow-up Telephone Call Date of discharge and from where: 01/25/21 from [facility]  How have you been since you were released from the hospital? Still in pain in his lower back. He stated that he has an appointment with PCP on Monday. Any questions or concerns? No  Items Reviewed: Did the pt receive and understand the discharge instructions provided? Yes  Medications obtained and verified? No  Other? No  Any new allergies since your discharge? No  Dietary orders reviewed? Yes Do you have support at home? Yes   Home Care and Equipment/Supplies: Were home health services ordered? no   Functional Questionnaire: (I = Independent and D = Dependent) ADLs:   Bathing/Dressing- I  Meal Prep- I  Eating- I  Maintaining continence- I  Transferring/Ambulation- I  Managing Meds- I  Follow up appointments reviewed:  PCP Hospital f/u appt confirmed? Yes  Scheduled to see [name] NP on 02/01/21. Specialist Hospital f/u appt confirmed? No . Are transportation arrangements needed? No  If their condition worsens, is the pt aware to call PCP or go to the Emergency Dept.? Yes Was the patient provided with contact information for the PCP's office or ED? Yes Was to pt encouraged to call back with questions or concerns? Yes

## 2021-01-29 ENCOUNTER — Ambulatory Visit: Payer: BC Managed Care – PPO | Admitting: Rehabilitative and Restorative Service Providers"

## 2021-02-01 ENCOUNTER — Encounter: Payer: Self-pay | Admitting: Physical Therapy

## 2021-02-01 ENCOUNTER — Ambulatory Visit: Payer: BC Managed Care – PPO | Admitting: Medical-Surgical

## 2021-02-01 ENCOUNTER — Encounter: Payer: Self-pay | Admitting: Medical-Surgical

## 2021-02-01 ENCOUNTER — Ambulatory Visit (INDEPENDENT_AMBULATORY_CARE_PROVIDER_SITE_OTHER): Payer: BC Managed Care – PPO | Admitting: Physical Therapy

## 2021-02-01 ENCOUNTER — Other Ambulatory Visit: Payer: Self-pay

## 2021-02-01 VITALS — BP 137/95 | HR 60 | Resp 20 | Ht 72.0 in | Wt 131.4 lb

## 2021-02-01 DIAGNOSIS — R21 Rash and other nonspecific skin eruption: Secondary | ICD-10-CM

## 2021-02-01 DIAGNOSIS — Z09 Encounter for follow-up examination after completed treatment for conditions other than malignant neoplasm: Secondary | ICD-10-CM

## 2021-02-01 DIAGNOSIS — W19XXXD Unspecified fall, subsequent encounter: Secondary | ICD-10-CM

## 2021-02-01 DIAGNOSIS — M6281 Muscle weakness (generalized): Secondary | ICD-10-CM

## 2021-02-01 DIAGNOSIS — D539 Nutritional anemia, unspecified: Secondary | ICD-10-CM | POA: Diagnosis not present

## 2021-02-01 DIAGNOSIS — Y92009 Unspecified place in unspecified non-institutional (private) residence as the place of occurrence of the external cause: Secondary | ICD-10-CM

## 2021-02-01 DIAGNOSIS — I10 Essential (primary) hypertension: Secondary | ICD-10-CM

## 2021-02-01 DIAGNOSIS — K254 Chronic or unspecified gastric ulcer with hemorrhage: Secondary | ICD-10-CM

## 2021-02-01 DIAGNOSIS — F10288 Alcohol dependence with other alcohol-induced disorder: Secondary | ICD-10-CM

## 2021-02-01 DIAGNOSIS — D696 Thrombocytopenia, unspecified: Secondary | ICD-10-CM

## 2021-02-01 DIAGNOSIS — M545 Low back pain, unspecified: Secondary | ICD-10-CM | POA: Diagnosis not present

## 2021-02-01 DIAGNOSIS — S32010D Wedge compression fracture of first lumbar vertebra, subsequent encounter for fracture with routine healing: Secondary | ICD-10-CM

## 2021-02-01 DIAGNOSIS — R2689 Other abnormalities of gait and mobility: Secondary | ICD-10-CM | POA: Diagnosis not present

## 2021-02-01 LAB — COMPLETE METABOLIC PANEL WITH GFR
AG Ratio: 1.3 (calc) (ref 1.0–2.5)
ALT: 45 U/L (ref 9–46)
AST: 48 U/L — ABNORMAL HIGH (ref 10–35)
Albumin: 4.5 g/dL (ref 3.6–5.1)
Alkaline phosphatase (APISO): 139 U/L (ref 35–144)
BUN: 11 mg/dL (ref 7–25)
CO2: 26 mmol/L (ref 20–32)
Calcium: 9.9 mg/dL (ref 8.6–10.3)
Chloride: 105 mmol/L (ref 98–110)
Creat: 0.99 mg/dL (ref 0.70–1.35)
Globulin: 3.4 g/dL (calc) (ref 1.9–3.7)
Glucose, Bld: 93 mg/dL (ref 65–99)
Potassium: 4.2 mmol/L (ref 3.5–5.3)
Sodium: 141 mmol/L (ref 135–146)
Total Bilirubin: 1 mg/dL (ref 0.2–1.2)
Total Protein: 7.9 g/dL (ref 6.1–8.1)
eGFR: 87 mL/min/{1.73_m2} (ref >=60)

## 2021-02-01 LAB — CBC WITH DIFFERENTIAL/PLATELET
Absolute Monocytes: 589 cells/uL (ref 200–950)
Basophils Absolute: 51 cells/uL (ref 0–200)
Basophils Relative: 1.3 %
Eosinophils Absolute: 304 cells/uL (ref 15–500)
Eosinophils Relative: 7.8 %
HCT: 35.6 % — ABNORMAL LOW (ref 38.5–50.0)
Hemoglobin: 11.8 g/dL — ABNORMAL LOW (ref 13.2–17.1)
Lymphs Abs: 1073 cells/uL (ref 850–3900)
MCH: 34.1 pg — ABNORMAL HIGH (ref 27.0–33.0)
MCHC: 33.1 g/dL (ref 32.0–36.0)
MCV: 102.9 fL — ABNORMAL HIGH (ref 80.0–100.0)
MPV: 10.6 fL (ref 7.5–12.5)
Monocytes Relative: 15.1 %
Neutro Abs: 1884 cells/uL (ref 1500–7800)
Neutrophils Relative %: 48.3 %
Platelets: 260 10*3/uL (ref 140–400)
RBC: 3.46 10*6/uL — ABNORMAL LOW (ref 4.20–5.80)
RDW: 13.1 % (ref 11.0–15.0)
Total Lymphocyte: 27.5 %
WBC: 3.9 10*3/uL (ref 3.8–10.8)

## 2021-02-01 MED ORDER — GABAPENTIN 300 MG PO CAPS: 300.0000 mg | ORAL_CAPSULE | Freq: Two times a day (BID) | ORAL | 1 refills | Status: DC

## 2021-02-01 MED ORDER — TRIAMCINOLONE ACETONIDE 0.5 % EX CREA: 1.0000 "application " | TOPICAL_CREAM | Freq: Two times a day (BID) | CUTANEOUS | 3 refills | Status: DC

## 2021-02-01 NOTE — Therapy (Signed)
[facility] [place] [address] [place], [name], [address] Phone: [phone]   Fax:  [phone]  Physical Therapy Evaluation  Patient Details  Name: Richard Andrews MRN: [id number] Date of Birth: 1961/05/27 Referring Provider (PT): Melanee Left   Encounter Date: 02/01/2021   PT End of Session - 02/01/21 6962     Visit Number 1    Number of Visits 12    Date for PT Re-Evaluation 03/15/21    PT Start Time 0845    PT Stop Time 0920    PT Time Calculation (min) 35 min    Activity Tolerance Patient tolerated treatment well    Behavior During Therapy [facility] for tasks assessed/performed             Past Medical History:  Diagnosis Date   Allergy    Distal radius fracture, left    Hypertension     Past Surgical History:  Procedure Laterality Date   BIOPSY  01/22/2021   Procedure: BIOPSY;  Surgeon: Shellia Cleverly, DO;  [facility]: [facility];  Service: Gastroenterology;;   ESOPHAGOGASTRODUODENOSCOPY (EGD) WITH PROPOFOL N/A 01/22/2021   Procedure: ESOPHAGOGASTRODUODENOSCOPY (EGD) WITH PROPOFOL;  Surgeon: Shellia Cleverly, DO;  Location: [facility];  Service: Gastroenterology;  Laterality: N/A;   I & D EXTREMITY Right 01/10/2020   Procedure: ORIF RIGHT WRIST;  Surgeon: Dominica Severin, MD;  Location: [facility];  Service: Orthopedics;  Laterality: Right;   OPEN REDUCTION INTERNAL FIXATION (ORIF) DISTAL RADIAL FRACTURE Left 01/28/2015   Procedure: OPEN REDUCTION INTERNAL FIXATION (ORIF) LEFT DISTAL RADIAL FRACTURE;  Surgeon: Tarry Kos, MD;  Location: [facility];  Service: Orthopedics;  Laterality: Left;   TONSILLECTOMY      There were no vitals filed for this visit.    Subjective Assessment - 02/01/21 0849     Subjective Pt had a fall at the end of July and fractured L1-L2. Pt states that he continues to have back pain across his lower back. He feels like his stability is better but his back still  hurts. Back pain increases with prolonged standing and sitting, eases with walking. 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 10 mins    Diagnostic tests CT shows compression fracture of L1    Patient Stated Goals decrease back pain    Currently in Pain? Yes    Pain Score 4     Pain Location Back    Pain Orientation Lower    Pain Descriptors / Indicators Aching;Sore    Pain Type Acute pain    Pain Onset 1 to 4 weeks ago    Pain Frequency Constant    Aggravating Factors  sitting, standing    Pain Relieving Factors walking                [facility] PT Assessment - 02/01/21 0001       Assessment   Medical Diagnosis general weakness    Referring Provider (PT) Elergawy, Dagwood    Onset Date/Surgical Date 01/09/21      Balance Screen   Has the patient fallen in the past 6 months Yes    How many times? exact number unknown    Has the patient had a decrease in activity level because of a fear of falling?  No    Is the patient reluctant to leave their home because of a  fear of falling?  No      Prior Function   Level of Independence Independent      Functional Tests   Functional tests Single leg stance      Single Leg Stance   Comments Rt 5 sec, Lt 2 sec      Posture/Postural Control   Posture Comments rounded shoulders, increased thoracic kyphosis      ROM / Strength   AROM / PROM / Strength AROM;Strength      AROM   AROM Assessment Site Lumbar    Lumbar Flexion limited 25%    Lumbar Extension limited 25%   pain   Lumbar - Right Side Bend WFL    Lumbar - Left Side Bend limited 25%    Lumbar - Right Rotation WFL    Lumbar - Left Rotation [facility]      Strength   Strength Assessment Site Hip    Right/Left Hip Right;Left    Right Hip Flexion 4/5    Right Hip Extension 3+/5   pain   Right Hip ABduction 4/5    Left Hip Flexion 4/5    Left Hip Extension 3+/5   pain    Left Hip ABduction 4/5      Flexibility   Soft Tissue Assessment /Muscle Length yes    Hamstrings decreased bilat    Piriformis decreased bilat      Palpation   Palpation comment no TTP lumbar paraspinals, piriformis bilat      Special Tests   Other special tests 5x STS 12.17 sec      Balance   Balance Assessed Yes      Standardized Balance Assessment   Standardized Balance Assessment Five Times Sit to Stand    Five times sit to stand comments  12.17sec      High Level Balance   High Level Balance Comments tandem stance 5 sec                        Objective measurements completed on examination: See above findings.       [facility] Adult PT Treatment/Exercise - 02/01/21 0001       Exercises   Exercises Lumbar      Lumbar Exercises: Stretches   Passive Hamstring Stretch 2 reps;20 seconds    Passive Hamstring Stretch Limitations seated      Lumbar Exercises: Standing   Row 10 reps    Theraband Level (Row) Level 2 (Red)    Shoulder Extension 10 reps    Theraband Level (Shoulder Extension) Level 2 (Red)    Other Standing Lumbar Exercises SLS and tandem stance with intermittent UE support up to 20 sec bilat      Lumbar Exercises: Seated   Sit to Stand 10 reps    Sit to Stand Limitations without UE support                    PT Education - 02/01/21 0918     Education Details PT POC and goals, HEP    Person(s) Educated Patient    Methods Explanation;Demonstration;Handout    Comprehension Returned demonstration;Verbalized understanding                 PT Long Term Goals - 02/01/21 0926       PT LONG TERM GOAL #1   Title Pt will be independent with HEP    Time 6    Period Weeks    Status New  Target Date 03/15/21      PT LONG TERM GOAL #2   Title Pt will improve LE strenth to 4+/5 to tolerate standing with decreased pain    Time 6    Period Weeks    Status New    Target Date 03/15/21      PT LONG TERM GOAL #3   Title  Pt will improve SLS on bilat LEs to >= 20 seconds to demo improved balance and decreased fall risk    Time 6    Period Weeks    Status New    Target Date 03/15/21      PT LONG TERM GOAL #4   Title Pt will improve 5x STS to <= 10 seconds to demo improved functional strength    Time 6    Period Weeks    Status New    Target Date 03/15/21                    Plan - 02/01/21 3235     Clinical Impression Statement Pt is a 60 y/o male referred after fall and hospitalization for muscle weakness. Pt presents with decreased strength, back pain, impaired posture, decreased activity tolerance and will benefit from skilled PT to address deficits and improve functional mobility    Personal Factors and Comorbidities Past/Current Experience;Comorbidity 1    Examination-Activity Limitations Sleep;Stand;Sit    Examination-Participation Restrictions Occupation    Stability/Clinical Decision Making Stable/Uncomplicated    Clinical Decision Making Low    Rehab Potential Good    PT Frequency 2x / week    PT Duration 6 weeks    PT Treatment/Interventions Taping;Dry needling;Manual techniques;Patient/family education;Therapeutic exercise;Electrical Stimulation;Cryotherapy;Moist Heat;Therapeutic activities    PT Next Visit Plan assess HEP, progress postural strength and balance    PT Home Exercise Plan [email or web address]    Consulted and Agree with Plan of Care Patient             Patient will benefit from skilled therapeutic intervention in order to improve the following deficits and impairments:  Pain, Impaired flexibility, Decreased strength, Decreased activity tolerance, Decreased balance  Visit Diagnosis: Muscle weakness (generalized) - Plan: PT plan of care cert/re-cert  Acute bilateral low back pain without sciatica - Plan: PT plan of care cert/re-cert  Balance problem - Plan: PT plan of care cert/re-cert     Problem List Patient Active Problem List   Diagnosis Date Noted   Alcohol  withdrawal syndrome without complication (HCC)    Gastritis and gastroduodenitis    Multiple gastric ulcers    Duodenal ulcer    Acute blood loss anemia    Alcohol withdrawal seizure with complication (HCC)    GI bleed 01/19/2021   QT prolongation 01/19/2021   Electrolyte imbalance 02/06/2020   Macrocytic anemia 02/06/2020   Acute pain of right wrist 02/06/2020   Encounter for orthopedic follow-up care 01/28/2020   Open Colles' fracture of right radius 01/10/2020   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

## 2021-02-01 NOTE — Patient Instructions (Signed)
Access Code: [email or web address] URL: [email or web address] Date: 02/01/2021 Prepared by: Reggy Eye  Exercises Standing Bilateral Low Shoulder Row with Anchored Resistance - 1 x daily - 7 x weekly - 3 sets - 10 reps Shoulder extension with resistance - Neutral - 1 x daily - 7 x weekly - 3 sets - 10 reps Sit to Stand Without Arm Support - 1 x daily - 7 x weekly - 3 sets - 10 reps Single Leg Stance with Support - 1 x daily - 7 x weekly - 1 sets - 10 reps - up to 20 sec hold Tandem Stance with Support - 1 x daily - 7 x weekly - 1 sets - 10 reps - up to 20 sec hold Seated Hamstring Stretch - 1 x daily - 7 x weekly - 3 sets - 1 reps - 20-30 sec hold

## 2021-02-01 NOTE — Progress Notes (Signed)
HPI with pertinent ROS:   CC: Hospital discharge follow-up  HPI: Pleasant 60 year old male presenting today for hospital discharge follow-up.  Was hospitalized from 8/9-01/2014 at [facility] for GI bleed, thrombocytopenia, alcohol withdrawal, and a fall resulting in an L1 compression fracture.  Today, he presents doing very well.  He reports he has been alcohol free for 21 days and has no urge to drink.  He has had no more issues with abdominal pain, melena, or hematochezia.  He is taking his GI medications as prescribed.  His biggest complaint is low back pain from the L1 compression fracture.  He is unable to do Tylenol due to liver function issues and cannot do ibuprofen due to his recent GI bleed.  He was not discharged with anything for pain and is wanting to know what he can take to help.  He has been using a heated recliner and he went to his first physical therapy appointment this morning.  He does have Biofreeze which is temporarily helpful.  He is wanting to know when he is released to go back to work.  He does have a sedentary job with no regular lifting, bending, climbing, or reaching.  Has a rash on his bilateral upper extremities and neck/chest.  This has been present for approximately the last 4 weeks.  He has not noticed any new medications, cosmetics, chemicals, or environmental exposures.  He did go without using his normal body wash while he was in the hospital for 7 days and noticed an improvement.  When he returned home, he uses body wash and felt that the rash worsened.  The rash is not painful however it does itch on his arm and he finds himself scratching it frequently.  I reviewed the past medical history, family history, social history, surgical history, and allergies today and no changes were needed.  Please see the problem list section below in epic for further details.   Physical exam:   General: Well Developed, well nourished, and in no acute distress.  Neuro: Alert and  oriented x3.  HEENT: Normocephalic, atraumatic  Skin: Warm and dry.  Scattered erythematous patches with excoriation and scabbing where he has scratched present on his bilateral upper extremities, neck, and upper chest.   Cardiac: Regular rate and rhythm, no murmurs rubs or gallops, no lower extremity edema.  Respiratory: Clear to auscultation bilaterally. Not using accessory muscles, speaking in full sentences.  Impression and Recommendations:    1. Hospital discharge follow-up Reviewed information from hospital stay and discharge summary.  2. 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  21 days alcohol free and doing very well so far.  Strongly advised continuing his alcohol cessation efforts. - COMPLETE METABOLIC PANEL WITH GFR  3. Macrocytic anemia CBC with differential today. - CBC with Differential/Platelet  4. Thrombocytopenia (HCC) CBC with differential today. - CBC with Differential/Platelet  5. Compression fracture of L1 vertebra with routine healing, subsequent encounter Continue physical therapy as directed.  Okay to return to work on 8/29 as long as he remains sedentary with out prolonged walking, lifting greater than 5 pounds, and no climbing/bending/reaching.  Letter written and sent to patient via MyChart along with a printed signed copy for this.  Due to limitations in options for pain control, we are starting gabapentin 300 mg nightly for 4 days then increase to 300 mg twice daily.  Advised patient of potential side effects and potential sedation.  6. Fall in home, subsequent encounter Doing much better now  that he is not drinking.  Continue physical therapy as directed  7. Gastrointestinal hemorrhage associated with gastric ulcer Follow-up with Dr. Barron Alvine.  Continue GI medications as directed.  8. Essential hypertension Blood pressure mildly elevated today but has been good at his most recent checks.  Holding off on blood pressure  medications today.  9. Rash Unclear etiology.  Possible dermatitis related to a new onset allergy to his shower gel since when he skipped it, his rash did not worsen.  Recommend using a hypoallergenic shower gel in place of his normal 1 to see if this helps.  Sending in triamcinolone cream for use on the most itchy spots.  Avoiding prednisone due to recent GI bleed and GI ulcerations.  Return in about 4 weeks (around 03/01/2021) for back pain follow up. ___________________________________________ Thayer Ohm, DNP, APRN, FNP-BC Primary Care and Sports Medicine [facility]

## 2021-02-04 ENCOUNTER — Encounter: Payer: Self-pay | Admitting: Physical Therapy

## 2021-02-04 ENCOUNTER — Encounter: Payer: Self-pay | Admitting: Medical-Surgical

## 2021-02-04 ENCOUNTER — Other Ambulatory Visit: Payer: Self-pay

## 2021-02-04 ENCOUNTER — Ambulatory Visit (INDEPENDENT_AMBULATORY_CARE_PROVIDER_SITE_OTHER): Payer: BC Managed Care – PPO | Admitting: Physical Therapy

## 2021-02-04 DIAGNOSIS — M545 Low back pain, unspecified: Secondary | ICD-10-CM

## 2021-02-04 DIAGNOSIS — R2689 Other abnormalities of gait and mobility: Secondary | ICD-10-CM | POA: Diagnosis not present

## 2021-02-04 DIAGNOSIS — M6281 Muscle weakness (generalized): Secondary | ICD-10-CM | POA: Diagnosis not present

## 2021-02-04 NOTE — Therapy (Signed)
[facility] [place] [address] [place], [name], [address] Phone: [phone]   Fax:  [phone]  Physical Therapy Treatment  Patient Details  Name: Richard Andrews MRN: [id number] Date of Birth: 07-01-1960 Referring Provider (PT): Melanee Left   Encounter Date: 02/04/2021   PT End of Session - 02/04/21 1615     Visit Number 2    Number of Visits 12    Date for PT Re-Evaluation 03/15/21    PT Start Time 1616    PT Stop Time 1659   ice pack last 10 min   PT Time Calculation (min) 43 min    Activity Tolerance Patient tolerated treatment well    Behavior During Therapy [facility] for tasks assessed/performed             Past Medical History:  Diagnosis Date   Allergy    Distal radius fracture, left    Hypertension     Past Surgical History:  Procedure Laterality Date   BIOPSY  01/22/2021   Procedure: BIOPSY;  Surgeon: Shellia Cleverly, DO;  Location: [facility];  Service: Gastroenterology;;   ESOPHAGOGASTRODUODENOSCOPY (EGD) WITH PROPOFOL N/A 01/22/2021   Procedure: ESOPHAGOGASTRODUODENOSCOPY (EGD) WITH PROPOFOL;  Surgeon: Shellia Cleverly, DO;  Location: [facility];  Service: Gastroenterology;  Laterality: N/A;   I & D EXTREMITY Right 01/10/2020   Procedure: ORIF RIGHT WRIST;  Surgeon: Dominica Severin, MD;  Location: [facility];  Service: Orthopedics;  Laterality: Right;   OPEN REDUCTION INTERNAL FIXATION (ORIF) DISTAL RADIAL FRACTURE Left 01/28/2015   Procedure: OPEN REDUCTION INTERNAL FIXATION (ORIF) LEFT DISTAL RADIAL FRACTURE;  Surgeon: Tarry Kos, MD;  Location: [facility];  Service: Orthopedics;  Laterality: Left;   TONSILLECTOMY      There were no vitals filed for this visit.   Subjective Assessment - 02/04/21 1629     Subjective Pt reports he may have over-done it on Tuesday. He woke up yesterday and "felt paralyzed", with the worst pain he has had.  He took some pain medicine and went back  to sleep with relief.  Heating pad made pain worse today. 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    Currently in Pain? Yes    Pain Score 5     Pain Location Back    Pain Orientation Lower    Aggravating Factors  sitting, overdoing it    Pain Relieving Factors walking                [facility] PT Assessment - 02/04/21 0001       Assessment   Medical Diagnosis general weakness    Referring Provider (PT) Elergawy, Dagwood    Onset Date/Surgical Date 01/09/21              [facility] Adult PT Treatment/Exercise - 02/04/21 0001       Lumbar Exercises: Stretches   Passive Hamstring Stretch Right;Left;2 reps;20 seconds    Passive Hamstring Stretch Limitations seated increaesd LBP, standing with foot on 12-18" step with heavy cues      Lumbar Exercises: Aerobic   Nustep L5: arms/legs x 6.5 min for warm up .      Lumbar Exercises: Standing   Row Strengthening;Both;10 reps    Theraband Level (Row) Level 2 (Red)    Shoulder Extension Strengthening;Both;10 reps    Theraband Level (Shoulder Extension) Level 2 (Red)    Shoulder Extension Limitations cues for form    Other Standing Lumbar Exercises SLS and tandem  stance with intermittent UE support up to10- 20 sec bilat x 2 reps each.   Trunk ext x 1 rep of 5 sec      Lumbar Exercises: Seated   Sit to Stand --   8 reps   Sit to Stand Limitations cues for forward wt shift and trial of UE forward.  fatigues quickly.      Modalities   Modalities Cryotherapy      Cryotherapy   Number Minutes Cryotherapy 10 Minutes    Cryotherapy Location Lumbar Spine    Type of Cryotherapy Ice pack   double white bolster under knees.  MHP on abdomen (for comfort)                        PT Long Term Goals - 02/01/21 0926       PT LONG TERM GOAL #1   Title Pt will be independent with HEP    Time 6    Period Weeks    Status New    Target Date 03/15/21      PT LONG TERM GOAL #2   Title Pt will improve LE strenth to 4+/5 to  tolerate standing with decreased pain    Time 6    Period Weeks    Status New    Target Date 03/15/21      PT LONG TERM GOAL #3   Title Pt will improve SLS on bilat LEs to >= 20 seconds to demo improved balance and decreased fall risk    Time 6    Period Weeks    Status New    Target Date 03/15/21      PT LONG TERM GOAL #4   Title Pt will improve 5x STS to <= 10 seconds to demo improved functional strength    Time 6    Period Weeks    Status New    Target Date 03/15/21                   Plan - 02/04/21 1653     Clinical Impression Statement Pt reported reduction of LBP with NuStep and standing exercises.  Increased pain with seated hamstring stretch, despite cues for form; difficulty with hip hinge.  Trial of ice to LB at end of session; reporting good tolerance.  goals are ongoing.    Personal Factors and Comorbidities Past/Current Experience;Comorbidity 1    Examination-Activity Limitations Sleep;Stand;Sit    Examination-Participation Restrictions Occupation    Stability/Clinical Decision Making Stable/Uncomplicated    Rehab Potential Good    PT Frequency 2x / week    PT Duration 6 weeks    PT Treatment/Interventions Taping;Dry needling;Manual techniques;Patient/family education;Therapeutic exercise;Electrical Stimulation;Cryotherapy;Moist Heat;Therapeutic activities    PT Next Visit Plan progress postural strength and balance.  issue posture / body mechanics - educate on neutral spine.    PT Home Exercise Plan [email or web address]    Consulted and Agree with Plan of Care Patient             Patient will benefit from skilled therapeutic intervention in order to improve the following deficits and impairments:  Pain, Impaired flexibility, Decreased strength, Decreased activity tolerance, Decreased balance  Visit Diagnosis: Muscle weakness (generalized)  Acute bilateral low back pain without sciatica  Balance problem     Problem List Patient Active Problem List    Diagnosis Date Noted   Alcohol withdrawal syndrome without complication (HCC)    Gastritis and gastroduodenitis    Multiple gastric ulcers  Duodenal ulcer    Acute blood loss anemia    Alcohol withdrawal seizure with complication (HCC)    GI bleed 01/19/2021   QT prolongation 01/19/2021   Electrolyte imbalance 02/06/2020   Macrocytic anemia 02/06/2020   Acute pain of right wrist 02/06/2020   Encounter for orthopedic follow-up care 01/28/2020   Open Colles' fracture of right radius 01/10/2020   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

## 2021-02-09 ENCOUNTER — Other Ambulatory Visit: Payer: Self-pay

## 2021-02-09 ENCOUNTER — Ambulatory Visit (INDEPENDENT_AMBULATORY_CARE_PROVIDER_SITE_OTHER): Payer: BC Managed Care – PPO | Admitting: Physical Therapy

## 2021-02-09 ENCOUNTER — Encounter: Payer: Self-pay | Admitting: Medical-Surgical

## 2021-02-09 ENCOUNTER — Other Ambulatory Visit: Payer: Self-pay | Admitting: Medical-Surgical

## 2021-02-09 DIAGNOSIS — M6281 Muscle weakness (generalized): Secondary | ICD-10-CM | POA: Diagnosis not present

## 2021-02-09 DIAGNOSIS — R2689 Other abnormalities of gait and mobility: Secondary | ICD-10-CM

## 2021-02-09 DIAGNOSIS — M545 Low back pain, unspecified: Secondary | ICD-10-CM

## 2021-02-09 MED ORDER — CALCITONIN (SALMON) 200 UNIT/ACT NA SOLN: 1.0000 | Freq: Every day | NASAL | 11 refills | Status: DC

## 2021-02-09 NOTE — Therapy (Signed)
[facility] [place] [address] [place], [name], [address] Phone: [phone]   Fax:  [phone]  Physical Therapy Treatment  Patient Details  Name: Richard Andrews MRN: [id number] Date of Birth: October 08, 1960 Referring Provider (PT): Melanee Left   Encounter Date: 02/09/2021   PT End of Session - 02/09/21 0843     Visit Number 3    Number of Visits 12    Date for PT Re-Evaluation 03/15/21    PT Start Time 0805    PT Stop Time 0850    PT Time Calculation (min) 45 min    Activity Tolerance Patient tolerated treatment well    Behavior During Therapy [facility] for tasks assessed/performed             Past Medical History:  Diagnosis Date   Allergy    Distal radius fracture, left    Hypertension     Past Surgical History:  Procedure Laterality Date   BIOPSY  01/22/2021   Procedure: BIOPSY;  Surgeon: Shellia Cleverly, DO;  [facility];  Service: Gastroenterology;;   ESOPHAGOGASTRODUODENOSCOPY (EGD) WITH PROPOFOL N/A 01/22/2021   Procedure: ESOPHAGOGASTRODUODENOSCOPY (EGD) WITH PROPOFOL;  Surgeon: Shellia Cleverly, DO;  [facility]: [facility];  Service: Gastroenterology;  Laterality: N/A;   I & D EXTREMITY Right 01/10/2020   Procedure: ORIF RIGHT WRIST;  Surgeon: Dominica Severin, MD;  Location: [facility];  Service: Orthopedics;  Laterality: Right;   OPEN REDUCTION INTERNAL FIXATION (ORIF) DISTAL RADIAL FRACTURE Left 01/28/2015   Procedure: OPEN REDUCTION INTERNAL FIXATION (ORIF) LEFT DISTAL RADIAL FRACTURE;  Surgeon: Tarry Kos, MD;  Location: [facility];  Service: Orthopedics;  Laterality: Left;   TONSILLECTOMY      There were no vitals filed for this visit.   Subjective Assessment - 02/09/21 0809     Subjective Pt states he returned to work yesterday and was hurting "really bad". stated he put a hot pack on around noon which helped but by the end of the day he was hurting    Currently in Pain?  Yes    Pain Score 7     Pain Location Back    Pain Orientation Lower;Medial    Pain Descriptors / Indicators Aching                               [facility] Adult PT Treatment/Exercise - 02/09/21 0001       Lumbar Exercises: Stretches   Passive Hamstring Stretch Right;Left;2 reps;30 seconds    Passive Hamstring Stretch Limitations standing with foot on 12'' step, cues for form      Lumbar Exercises: Aerobic   Nustep L5 x 6 mins for warm up      Lumbar Exercises: Standing   Row Strengthening;20 reps    Theraband Level (Row) Level 2 (Red)    Shoulder Extension Strengthening;20 reps    Theraband Level (Shoulder Extension) Level 2 (Red)    Other Standing Lumbar Exercises SLS x 20 sec each, tandem stance x 30 sec each on ground and then on foam with intermittent UE support, rocker board A/P and laterally x 60 sec each with intermittent UE support    Other Standing Lumbar Exercises pallof press red TB x 20 bilat      Modalities   Modalities Electrical Stimulation      Cryotherapy   Number Minutes Cryotherapy 10 Minutes    Cryotherapy Location Lumbar Spine  Type of Cryotherapy Ice pack      Electrical Stimulation   Electrical Stimulation Location lumbar    Electrical Stimulation Action TENS    Electrical Stimulation Parameters to tolerance    Electrical Stimulation Goals Pain      Manual Therapy   Manual Therapy Joint mobilization;Soft tissue mobilization    Joint Mobilization gentle lumbar rotation in sidelying, pelvic A/P tilts with pt in sidelying    Soft tissue mobilization STM lumbar paraspinals to reduce pain                         PT Long Term Goals - 02/01/21 0926       PT LONG TERM GOAL #1   Title Pt will be independent with HEP    Time 6    Period Weeks    Status New    Target Date 03/15/21      PT LONG TERM GOAL #2   Title Pt will improve LE strenth to 4+/5 to tolerate standing with decreased pain    Time 6    Period  Weeks    Status New    Target Date 03/15/21      PT LONG TERM GOAL #3   Title Pt will improve SLS on bilat LEs to >= 20 seconds to demo improved balance and decreased fall risk    Time 6    Period Weeks    Status New    Target Date 03/15/21      PT LONG TERM GOAL #4   Title Pt will improve 5x STS to <= 10 seconds to demo improved functional strength    Time 6    Period Weeks    Status New    Target Date 03/15/21                   Plan - 02/09/21 0844     Clinical Impression Statement Pt with good pain relief with ice and TENS. Pt has home TENs machine and PT recommended having him bring it in. Pt able to tolerate progression of core and balance exercises with no increase in pain    PT Next Visit Plan progress postural strength and balance, modalities/manual as tolerated    PT Home Exercise Plan [id number]    Consulted and Agree with Plan of Care Patient             Patient will benefit from skilled therapeutic intervention in order to improve the following deficits and impairments:     Visit Diagnosis: Muscle weakness (generalized)  Acute bilateral low back pain without sciatica  Balance problem     Problem List Patient Active Problem List   Diagnosis Date Noted   Alcohol withdrawal syndrome without complication (HCC)    Gastritis and gastroduodenitis    Multiple gastric ulcers    Duodenal ulcer    Acute blood loss anemia    Alcohol withdrawal seizure with complication (HCC)    GI bleed 01/19/2021   QT prolongation 01/19/2021   Electrolyte imbalance 02/06/2020   Macrocytic anemia 02/06/2020   Acute pain of right wrist 02/06/2020   Encounter for orthopedic follow-up care 01/28/2020   Open Colles' fracture of right radius 01/10/2020   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 [place], [name], [address] Phone: [phone]   Fax:  [phone]  Name: BYRAN BILOTTI MRN: [id number] Date of Birth: 1960/09/17

## 2021-02-10 MED ORDER — GABAPENTIN 400 MG PO CAPS: 1200.0000 mg | ORAL_CAPSULE | Freq: Three times a day (TID) | ORAL | 1 refills | Status: DC

## 2021-02-11 ENCOUNTER — Other Ambulatory Visit: Payer: Self-pay

## 2021-02-11 ENCOUNTER — Ambulatory Visit (INDEPENDENT_AMBULATORY_CARE_PROVIDER_SITE_OTHER): Payer: BC Managed Care – PPO | Admitting: Physical Therapy

## 2021-02-11 DIAGNOSIS — M545 Low back pain, unspecified: Secondary | ICD-10-CM | POA: Diagnosis not present

## 2021-02-11 DIAGNOSIS — R2689 Other abnormalities of gait and mobility: Secondary | ICD-10-CM

## 2021-02-11 DIAGNOSIS — M6281 Muscle weakness (generalized): Secondary | ICD-10-CM

## 2021-02-11 NOTE — Therapy (Signed)
[place], [place], [address] Phone: [phone]   Fax:  [phone]  Physical Therapy Treatment  Patient Details  Name: Richard Andrews MRN: [id number] Date of Birth: 1960/12/01 Referring Provider (PT): Marybelle Killings   Encounter Date: 02/11/2021   PT End of Session - 02/11/21 1625     Visit Number 4    Number of Visits 12    Date for PT Re-Evaluation 03/15/21    PT Start Time 1619    PT Stop Time 1710    PT Time Calculation (min) 51 min    Activity Tolerance Patient tolerated treatment well    Behavior During Therapy [facility] for tasks assessed/performed             Past Medical History:  Diagnosis Date   Allergy    Distal radius fracture, left    Hypertension     Past Surgical History:  Procedure Laterality Date   BIOPSY  01/22/2021   Procedure: BIOPSY;  Surgeon: Lavena Bullion, DO;  Location: [facility];  Service: Gastroenterology;;   ESOPHAGOGASTRODUODENOSCOPY (EGD) WITH PROPOFOL N/A 01/22/2021   Procedure: ESOPHAGOGASTRODUODENOSCOPY (EGD) WITH PROPOFOL;  Surgeon: Lavena Bullion, DO;  Location: [facility];  Service: Gastroenterology;  Laterality: N/A;   I & D EXTREMITY Right 01/10/2020   Procedure: ORIF RIGHT WRIST;  Surgeon: Roseanne Kaufman, MD;  Location: [place];  Service: Orthopedics;  Laterality: Right;   OPEN REDUCTION INTERNAL FIXATION (ORIF) DISTAL RADIAL FRACTURE Left 01/28/2015   Procedure: OPEN REDUCTION INTERNAL FIXATION (ORIF) LEFT DISTAL RADIAL FRACTURE;  Surgeon: Leandrew Koyanagi, MD;  Location: [place];  Service: Orthopedics;  Laterality: Left;   TONSILLECTOMY      There were no vitals filed for this visit.   Subjective Assessment - 02/11/21 1626     Subjective Pt reports he has had more back pain since yesterday after lunch.  He used TENS, "it felt good at the time".  No additional falls since starting therapy.    Currently in Pain? Yes    Pain  Score 8     Pain Location Back    Pain Orientation Lower;Medial    Pain Descriptors / Indicators Aching    Aggravating Factors  ?    Pain Relieving Factors walking, ice                [facility] PT Assessment - 02/11/21 0001       Assessment   Medical Diagnosis general weakness    Referring Provider (PT) Elergawy, [place]    Onset Date/Surgical Date 01/09/21      Single Leg Stance   Comments Rt/Lt 20 sec      Standardized Balance Assessment   Five times sit to stand comments  13.05 sec   no UE support             [facility] Adult PT Treatment/Exercise - 02/11/21 0001       Lumbar Exercises: Stretches   Lower Trunk Rotation Limitations gentle rocking; painful.    Other Lumbar Stretch Exercise middle and high doorway stretch x 10-15 sec    Other Lumbar Stretch Exercise seated thoracic ext with / without hands behind head - x 3 reps, 5 sec; painful - stopped      Lumbar Exercises: Aerobic   Nustep L5 x 5 mins for warm up, legs only      Lumbar Exercises: Standing   Row Strengthening;Both;10 reps   2 sets   Theraband  Level (Row) Level 3 (Green)    Shoulder Extension Strengthening;10 reps   2 sets   Theraband Level (Shoulder Extension) Level 2 (Red)    Other Standing Lumbar Exercises SLS 20 sec each leg x 2      Lumbar Exercises: Seated   Sit to Stand Limitations 5 reps for 5x STS test      Lumbar Exercises: Supine   Other Supine Lumbar Exercises scap squeeze/ thoracic lift x 5 sec x 10    Other Supine Lumbar Exercises bridge isometric - without lifting hips from table x 5 sec x 10 reps      Cryotherapy   Number Minutes Cryotherapy 10 Minutes    Cryotherapy Location Lumbar Spine    Type of Cryotherapy Ice pack      Electrical Stimulation   Electrical Stimulation Location lumbar    Electrical Stimulation Action TENS    Electrical Stimulation Parameters to toleranc, 10 min    Electrical Stimulation Goals Pain                         PT Long Term  Goals - 02/11/21 1639       PT LONG TERM GOAL #1   Title Pt will be independent with HEP    Time 6    Period Weeks    Status On-going      PT LONG TERM GOAL #2   Title Pt will improve LE strenth to 4+/5 to tolerate standing with decreased pain    Time 6    Period Weeks    Status On-going      PT LONG TERM GOAL #3   Title Pt will improve SLS on bilat LEs to >= 20 seconds to demo improved balance and decreased fall risk    Time 6    Period Weeks    Status Achieved      PT LONG TERM GOAL #4   Title Pt will improve 5x STS to <= 10 seconds to demo improved functional strength    Time 6    Period Weeks    Status New                   Plan - 02/11/21 1649     Clinical Impression Statement Pt tolerated standing exercises better than seated.  He did not tolerate gentle thoracic ext nor supine LTR (rocking with limited range) due to pain.  He reported some relief with ice and TENS at end of session. Pt has met his SLS goal. All other goals are ongoing.    PT Frequency 2x / week    PT Duration 6 weeks    PT Treatment/Interventions Taping;Dry needling;Manual techniques;Patient/family education;Therapeutic exercise;Electrical Stimulation;Cryotherapy;Moist Heat;Therapeutic activities    PT Next Visit Plan progress postural strength and balance, modalities/manual as tolerated    PT Home Exercise Plan [id number]    Consulted and Agree with Plan of Care Patient             Patient will benefit from skilled therapeutic intervention in order to improve the following deficits and impairments:  Pain, Impaired flexibility, Decreased strength, Decreased activity tolerance, Decreased balance  Visit Diagnosis: Muscle weakness (generalized)  Acute bilateral low back pain without sciatica  Balance problem     Problem List Patient Active Problem List   Diagnosis Date Noted   Alcohol withdrawal syndrome without complication (HCC)    Gastritis and gastroduodenitis    Multiple  gastric ulcers  Duodenal ulcer    Acute blood loss anemia    Alcohol withdrawal seizure with complication (HCC)    GI bleed 01/19/2021   QT prolongation 01/19/2021   Electrolyte imbalance 02/06/2020   Macrocytic anemia 02/06/2020   Acute pain of right wrist 02/06/2020   Encounter for orthopedic follow-up care 01/28/2020   Open Colles' fracture of right radius 01/10/2020   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

## 2021-02-16 ENCOUNTER — Other Ambulatory Visit: Payer: Self-pay | Admitting: Medical-Surgical

## 2021-02-16 ENCOUNTER — Encounter: Payer: Self-pay | Admitting: Medical-Surgical

## 2021-02-16 DIAGNOSIS — S32010D Wedge compression fracture of first lumbar vertebra, subsequent encounter for fracture with routine healing: Secondary | ICD-10-CM

## 2021-02-19 NOTE — Addendum Note (Signed)
Addended by: Leone Brand on: 02/19/2021 10:17 AM   Modules accepted: Orders

## 2021-02-24 ENCOUNTER — Encounter: Payer: BC Managed Care – PPO | Admitting: Physical Therapy

## 2021-02-26 ENCOUNTER — Other Ambulatory Visit: Payer: Self-pay

## 2021-02-26 ENCOUNTER — Ambulatory Visit (INDEPENDENT_AMBULATORY_CARE_PROVIDER_SITE_OTHER): Payer: BC Managed Care – PPO | Admitting: Physical Therapy

## 2021-02-26 DIAGNOSIS — R2689 Other abnormalities of gait and mobility: Secondary | ICD-10-CM

## 2021-02-26 DIAGNOSIS — M6281 Muscle weakness (generalized): Secondary | ICD-10-CM

## 2021-02-26 DIAGNOSIS — M545 Low back pain, unspecified: Secondary | ICD-10-CM | POA: Diagnosis not present

## 2021-02-26 NOTE — Therapy (Signed)
[facility] [place] [address] [place], [name], [address] Phone: [phone]   Fax:  [phone]  Physical Therapy Treatment  Patient Details  Name: Richard Andrews MRN: [id number] Date of Birth: [birth year] Referring Provider (PT): Melanee Left   Encounter Date: 02/26/2021   PT End of Session - 02/26/21 1352     Visit Number 5    Number of Visits 12    Date for PT Re-Evaluation 03/15/21    PT Start Time 1315    PT Stop Time 1400    PT Time Calculation (min) 45 min    Activity Tolerance Patient tolerated treatment well    Behavior During Therapy [facility] for tasks assessed/performed             Past Medical History:  Diagnosis Date   Allergy    Distal radius fracture, left    Hypertension     Past Surgical History:  Procedure Laterality Date   BIOPSY  01/22/2021   Procedure: BIOPSY;  Surgeon: Shellia Cleverly, DO;  [facility];  Service: Gastroenterology;;   ESOPHAGOGASTRODUODENOSCOPY (EGD) WITH PROPOFOL N/A 01/22/2021   Procedure: ESOPHAGOGASTRODUODENOSCOPY (EGD) WITH PROPOFOL;  Surgeon: Shellia Cleverly, DO;  [facility];  Service: Gastroenterology;  Laterality: N/A;   I & D EXTREMITY Right 01/10/2020   Procedure: ORIF RIGHT WRIST;  Surgeon: Dominica Severin, MD;  [facility]: [facility];  Service: Orthopedics;  Laterality: Right;   OPEN REDUCTION INTERNAL FIXATION (ORIF) DISTAL RADIAL FRACTURE Left 01/28/2015   Procedure: OPEN REDUCTION INTERNAL FIXATION (ORIF) LEFT DISTAL RADIAL FRACTURE;  Surgeon: Tarry Kos, MD;  Location: [facility];  Service: Orthopedics;  Laterality: Left;   TONSILLECTOMY      There were no vitals filed for this visit.   Subjective Assessment - 02/26/21 1318     Subjective Pt reports that yesterday was a "rough day" but that he is feeling better today.    Currently in Pain? Yes    Pain Score 4     Pain Location Back    Pain Orientation Lower    Pain  Descriptors / Indicators Aching                [facility] PT Assessment - 02/26/21 0001       Assessment   Medical Diagnosis general weakness    Referring Provider (PT) Elergawy, Dag[place]    Onset Date/Surgical Date 01/09/21      AROM   Lumbar Flexion limited 10%    Lumbar Extension WFL    Lumbar - Left Side Bend limited 25%      Strength   Right Hip Flexion 4+/5    Right Hip Extension 4-/5   pain   Right Hip ABduction 4+/5    Left Hip Flexion 4+/5    Left Hip Extension 4-/5   pain   Left Hip ABduction 4+/5                           [facility] Adult PT Treatment/Exercise - 02/26/21 0001       Lumbar Exercises: Stretches   Passive Hamstring Stretch Right;Left;30 seconds    Passive Hamstring Stretch Limitations standing with foot on 12'' step, cues for form    Gastroc Stretch Right;Left;30 seconds      Lumbar Exercises: Aerobic   Nustep L5 x 5 mins for warm up      Lumbar Exercises: Standing   Wall Slides 20 reps;3 seconds  Row Strengthening;20 reps    Theraband Level (Row) Level 3 (Green)    Shoulder Extension Strengthening;20 reps    Theraband Level (Shoulder Extension) Level 3 (Green)    Other Standing Lumbar Exercises tandem stance on foam 30 sec bilat with intermittent UE support, rocker board laterally and A/P x 1 min each      Lumbar Exercises: Supine   Other Supine Lumbar Exercises bridge isometric - without lifting hips from table x 5 sec x 10 reps      Cryotherapy   Number Minutes Cryotherapy 10 Minutes    Cryotherapy Location Lumbar Spine    Type of Cryotherapy Ice pack      Electrical Stimulation   Electrical Stimulation Location lumbar    Electrical Stimulation Action TENS    Electrical Stimulation Parameters to tolerance    Electrical Stimulation Goals Pain      Manual Therapy   Joint Mobilization grade 2 SIJ UPAs and CPAs to reduce pain    Soft tissue mobilization STM lumbar paraspinals and glutes to reduce pain                           PT Long Term Goals - 02/11/21 1639       PT LONG TERM GOAL #1   Title Pt will be independent with HEP    Time 6    Period Weeks    Status On-going      PT LONG TERM GOAL #2   Title Pt will improve LE strenth to 4+/5 to tolerate standing with decreased pain    Time 6    Period Weeks    Status On-going      PT LONG TERM GOAL #3   Title Pt will improve SLS on bilat LEs to >= 20 seconds to demo improved balance and decreased fall risk    Time 6    Period Weeks    Status Achieved      PT LONG TERM GOAL #4   Title Pt will improve 5x STS to <= 10 seconds to demo improved functional strength    Time 6    Period Weeks    Status New                   Plan - 02/26/21 1352     Clinical Impression Statement Pt with good tolerance to balance and core strengthening exercises this session. Improved lumbar ROM with decreased pain. Still painful with bridge and hip extension    PT Next Visit Plan progress postural strength and balance, modalities/manual as tolerated    PT Home Exercise Plan [id number]    Consulted and Agree with Plan of Care Patient             Patient will benefit from skilled therapeutic intervention in order to improve the following deficits and impairments:     Visit Diagnosis: Muscle weakness (generalized)  Acute bilateral low back pain without sciatica  Balance problem     Problem List Patient Active Problem List   Diagnosis Date Noted   Alcohol withdrawal syndrome without complication (HCC)    Gastritis and gastroduodenitis    Multiple gastric ulcers    Duodenal ulcer    Acute blood loss anemia    Alcohol withdrawal seizure with complication (HCC)    GI bleed 01/19/2021   QT prolongation 01/19/2021   Electrolyte imbalance 02/06/2020   Macrocytic anemia 02/06/2020   Acute pain of right wrist 02/06/2020  Encounter for orthopedic follow-up care 01/28/2020   Open Colles' fracture of right radius 01/10/2020    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 [address] [place], [name], [address] Phone: [phone]   Fax:  [phone]  Name: CASSON CATENA MRN: [id number] Date of Birth: [birth year]

## 2021-03-01 ENCOUNTER — Other Ambulatory Visit: Payer: Self-pay

## 2021-03-01 ENCOUNTER — Encounter: Payer: Self-pay | Admitting: Medical-Surgical

## 2021-03-01 ENCOUNTER — Ambulatory Visit: Payer: BC Managed Care – PPO | Admitting: Medical-Surgical

## 2021-03-01 VITALS — BP 144/85 | HR 71 | Resp 20 | Ht 72.0 in | Wt 144.0 lb

## 2021-03-01 DIAGNOSIS — S32010D Wedge compression fracture of first lumbar vertebra, subsequent encounter for fracture with routine healing: Secondary | ICD-10-CM

## 2021-03-01 MED ORDER — CYCLOBENZAPRINE HCL 10 MG PO TABS: 10.0000 mg | ORAL_TABLET | Freq: Three times a day (TID) | ORAL | 0 refills | Status: DC | PRN

## 2021-03-01 MED ORDER — TRAMADOL HCL 50 MG PO TABS: 50.0000 mg | ORAL_TABLET | Freq: Three times a day (TID) | ORAL | 0 refills | Status: AC | PRN

## 2021-03-01 NOTE — Progress Notes (Signed)
  HPI with pertinent ROS:   CC: Back pain follow-up  HPI: Pleasant 60 year old male presenting today for follow-up on back pain.  He had a L1 compression fracture after a fall approximately 5 to 6 weeks ago.  He reports he still has quite a bit of pain for sitting in the morning and in the afternoon.  He is able to do all of his required activities at home and work however when he sits down in the afternoon, after about 15 minutes, he is in considerable discomfort.  He is doing physical therapy although he missed a week because of a confusion issue around his appointments.  He does have appointment set up for the rest of the week.  Currently taking gabapentin 1200 mg 2-3 times daily as well as Tylenol 1000 mg 3 times a day.  Avoiding anti-inflammatories due to recent GI bleed.  Denies fever, chills, new onset incontinence, weakness/numbness/tingling of the lower extremities.  Admits that he did have alcohol over the weekend when he was relaxing in the recliner and his pain was so bad despite the medications.  He drank half a bottle and for the other half out.  Reports that he intends to get back on the wagon and has no intention to drink further alcohol.  I reviewed the past medical history, family history, social history, surgical history, and allergies today and no changes were needed.  Please see the problem list section below in epic for further details.   Physical exam:   General: Well Developed, well nourished, and in no acute distress.  Neuro: Alert and oriented x3.  HEENT: Normocephalic, atraumatic.  Skin: Warm and dry. Cardiac: Regular rate and rhythm, no murmurs rubs or gallops, no lower extremity edema.  Respiratory: Clear to auscultation bilaterally. Not using accessory muscles, speaking in full sentences.  Impression and Recommendations:    1. Compression fracture of L1 vertebra with routine healing, subsequent encounter Continue gabapentin and Tylenol.  Adding Flexeril 10 mg 3  times daily as needed.  Also adding tramadol 50 mg 3 times daily as needed.  He does have a history of QT prolongation but on last EKG, this had resolved.  Strongly cautioned to avoid all alcohol intake while taking tramadol.  At his next visit, we will need to test for alcohol consumption.  Return in about 4 weeks (around 03/29/2021) for back pain follow up. ___________________________________________ Thayer Ohm, DNP, APRN, FNP-BC Primary Care and Sports Medicine [facility] [place]

## 2021-03-03 ENCOUNTER — Encounter: Payer: Self-pay | Admitting: Physical Therapy

## 2021-03-03 ENCOUNTER — Ambulatory Visit (INDEPENDENT_AMBULATORY_CARE_PROVIDER_SITE_OTHER): Payer: BC Managed Care – PPO | Admitting: Physical Therapy

## 2021-03-03 ENCOUNTER — Other Ambulatory Visit: Payer: Self-pay

## 2021-03-03 DIAGNOSIS — M545 Low back pain, unspecified: Secondary | ICD-10-CM

## 2021-03-03 DIAGNOSIS — R2689 Other abnormalities of gait and mobility: Secondary | ICD-10-CM | POA: Diagnosis not present

## 2021-03-03 DIAGNOSIS — M6281 Muscle weakness (generalized): Secondary | ICD-10-CM | POA: Diagnosis not present

## 2021-03-03 NOTE — Therapy (Signed)
[place], [place], [address] Phone: [phone]   Fax:  [phone]  Physical Therapy Treatment  Patient Details  Name: Richard Andrews MRN: [id number] Date of Birth: 02-23-1961 Referring Provider (PT): Marybelle Killings   Encounter Date: 03/03/2021   PT End of Session - 03/03/21 1644     Visit Number 6    Number of Visits 12    Date for PT Re-Evaluation 03/15/21    PT Start Time 1610   pt arrived late   PT Stop Time 1639    PT Time Calculation (min) 29 min    Activity Tolerance Patient tolerated treatment well    Behavior During Therapy [facility] for tasks assessed/performed             Past Medical History:  Diagnosis Date   Allergy    Distal radius fracture, left    Hypertension     Past Surgical History:  Procedure Laterality Date   BIOPSY  01/22/2021   Procedure: BIOPSY;  Surgeon: Lavena Bullion, DO;  Location: [facility];  Service: Gastroenterology;;   ESOPHAGOGASTRODUODENOSCOPY (EGD) WITH PROPOFOL N/A 01/22/2021   Procedure: ESOPHAGOGASTRODUODENOSCOPY (EGD) WITH PROPOFOL;  Surgeon: Lavena Bullion, DO;  Location: [place];  Service: Gastroenterology;  Laterality: N/A;   I & D EXTREMITY Right 01/10/2020   Procedure: ORIF RIGHT WRIST;  Surgeon: Roseanne Kaufman, MD;  Location: [place];  Service: Orthopedics;  Laterality: Right;   OPEN REDUCTION INTERNAL FIXATION (ORIF) DISTAL RADIAL FRACTURE Left 01/28/2015   Procedure: OPEN REDUCTION INTERNAL FIXATION (ORIF) LEFT DISTAL RADIAL FRACTURE;  Surgeon: Leandrew Koyanagi, MD;  Location: [place];  Service: Orthopedics;  Laterality: Left;   TONSILLECTOMY      There were no vitals filed for this visit.   Subjective Assessment - 03/03/21 1613     Subjective Pt reports he had a medication change on Monday for pain.  His pain is significantly less, but he feels a little more dizziness with walking. He has only been doing the  balance exercises from HEP.    Patient Stated Goals decrease back pain    Currently in Pain? Yes    Pain Score 2     Pain Location Back    Pain Orientation Lower    Pain Descriptors / Indicators Aching    Aggravating Factors  ?    Pain Relieving Factors pain medicine, walking.                [facility] PT Assessment - 03/03/21 0001       Assessment   Medical Diagnosis general weakness    Referring Provider (PT) Elergawy, Dagwood    Onset Date/Surgical Date 01/09/21      Standardized Balance Assessment   Five times sit to stand comments  9.7 seconds               [facility] Adult PT Treatment/Exercise - 03/03/21 0001       Lumbar Exercises: Aerobic   Nustep L5 x 5 mins for warm up      Lumbar Exercises: Standing   Row Strengthening;20 reps    Theraband Level (Row) Level 3 (Green)    Row Limitations cues for posture    Shoulder Extension Strengthening;20 reps    Theraband Level (Shoulder Extension) Level 3 (Green)    Other Standing Lumbar Exercises SLS x 20 sec each leg, then trial with horiz head turns - unable to complete longer than 4-5 seconds.  Tandem stance with horizontal head turns.  Tandem gait forward/ backward x 10 ft x 2 reps.      Lumbar Exercises: Seated   Sit to Stand 5 reps   2 sets -for 5x sit to stand     Lumbar Exercises: Supine   Bridge 10 reps   limited lift for comfort.     Lumbar Exercises: Prone   Other Prone Lumbar Exercises TKE with glute set x 3-5 sec x 10 each leg.      Modalities   Modalities --   pt declined.                         PT Long Term Goals - 03/03/21 1624       PT LONG TERM GOAL #1   Title Pt will be independent with HEP    Time 6    Period Weeks    Status On-going      PT LONG TERM GOAL #2   Title Pt will improve LE strenth to 4+/5 to tolerate standing with decreased pain    Time 6    Period Weeks    Status Partially Met      PT LONG TERM GOAL #3   Title Pt will improve SLS on bilat LEs to >=  20 seconds to demo improved balance and decreased fall risk    Time 6    Period Weeks    Status Achieved      PT LONG TERM GOAL #4   Title Pt will improve 5x STS to <= 10 seconds to demo improved functional strength    Time 6    Period Weeks    Status Achieved                   Plan - 03/03/21 1616     Clinical Impression Statement Pt has difficulty laying on mat table; improved tolerance with pillow under lower back.  Prone TKE with glute set tolerated better than prone/standing hip ext.  He was able to complete horiz head turns with tandem stance, but not SLS.  He has met his 5x STS goal.  Progressing well towards remaining goals.    Rehab Potential Good    PT Frequency 2x / week    PT Duration 6 weeks    PT Treatment/Interventions Taping;Dry needling;Manual techniques;Patient/family education;Therapeutic exercise;Electrical Stimulation;Cryotherapy;Moist Heat;Therapeutic activities    PT Next Visit Plan progress postural strength and balance, modalities/manual as tolerated    PT Home Exercise Plan [id number]    Consulted and Agree with Plan of Care Patient             Patient will benefit from skilled therapeutic intervention in order to improve the following deficits and impairments:  Pain, Impaired flexibility, Decreased strength, Decreased activity tolerance, Decreased balance  Visit Diagnosis: Muscle weakness (generalized)  Acute bilateral low back pain without sciatica  Balance problem     Problem List Patient Active Problem List   Diagnosis Date Noted   Alcohol withdrawal syndrome without complication ([place])    Gastritis and gastroduodenitis    Multiple gastric ulcers    Duodenal ulcer    Acute blood loss anemia    Alcohol withdrawal seizure with complication (HCC)    GI bleed 01/19/2021   QT prolongation 01/19/2021   Electrolyte imbalance 02/06/2020   Macrocytic anemia 02/06/2020   Acute pain of right wrist 02/06/2020   Encounter for orthopedic  follow-up care 01/28/2020   Open  Colles' fracture of right radius 01/10/2020   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, [place], [address] Phone: [phone]   Fax:  [phone]  Name: Richard Andrews MRN: [id number] Date of Birth: 14-Oct-1960

## 2021-03-05 ENCOUNTER — Ambulatory Visit (INDEPENDENT_AMBULATORY_CARE_PROVIDER_SITE_OTHER): Payer: BC Managed Care – PPO | Admitting: Physical Therapy

## 2021-03-05 ENCOUNTER — Other Ambulatory Visit: Payer: Self-pay

## 2021-03-05 ENCOUNTER — Encounter: Payer: Self-pay | Admitting: Physical Therapy

## 2021-03-05 DIAGNOSIS — R2689 Other abnormalities of gait and mobility: Secondary | ICD-10-CM | POA: Diagnosis not present

## 2021-03-05 DIAGNOSIS — M545 Low back pain, unspecified: Secondary | ICD-10-CM

## 2021-03-05 DIAGNOSIS — M6281 Muscle weakness (generalized): Secondary | ICD-10-CM

## 2021-03-05 NOTE — Therapy (Addendum)
[place], [place], [address] Phone: [phone]   Fax:  [phone]  Physical Therapy Treatment and Discharge  Patient Details  Name: Richard Andrews MRN: [id number] Date of Birth: 10-Nov-1960 Referring Provider (PT): Marybelle Killings   Encounter Date: 03/05/2021   PT End of Session - 03/05/21 1232     Visit Number 7    Number of Visits 12    Date for PT Re-Evaluation 03/15/21    PT Start Time 7615    PT Stop Time 1223    PT Time Calculation (min) 38 min    Activity Tolerance Patient tolerated treatment well    Behavior During Therapy [facility] for tasks assessed/performed             Past Medical History:  Diagnosis Date   Allergy    Distal radius fracture, left    Hypertension     Past Surgical History:  Procedure Laterality Date   BIOPSY  01/22/2021   Procedure: BIOPSY;  Surgeon: Lavena Bullion, DO;  Location: [facility];  Service: Gastroenterology;;   ESOPHAGOGASTRODUODENOSCOPY (EGD) WITH PROPOFOL N/A 01/22/2021   Procedure: ESOPHAGOGASTRODUODENOSCOPY (EGD) WITH PROPOFOL;  Surgeon: Lavena Bullion, DO;  Location: [place];  Service: Gastroenterology;  Laterality: N/A;   I & D EXTREMITY Right 01/10/2020   Procedure: ORIF RIGHT WRIST;  Surgeon: Roseanne Kaufman, MD;  Location: [facility];  Service: Orthopedics;  Laterality: Right;   OPEN REDUCTION INTERNAL FIXATION (ORIF) DISTAL RADIAL FRACTURE Left 01/28/2015   Procedure: OPEN REDUCTION INTERNAL FIXATION (ORIF) LEFT DISTAL RADIAL FRACTURE;  Surgeon: Leandrew Koyanagi, MD;  Location: [place];  Service: Orthopedics;  Laterality: Left;   TONSILLECTOMY      There were no vitals filed for this visit.   Subjective Assessment - 03/05/21 1246     Subjective Pt reports he can "always feel it" in back, but with the medicine change, he no longer has pain.    Pertinent History frequent falls    Diagnostic tests CT shows  compression fracture of L1    Patient Stated Goals decrease back pain    Currently in Pain? No/denies    Pain Score 0-No pain                [facility] PT Assessment - 03/05/21 0001       Assessment   Medical Diagnosis general weakness    Referring Provider (PT) Elergawy, Dagwood    Onset Date/Surgical Date 01/09/21              [facility] Adult PT Treatment/Exercise - 03/05/21 0001       Lumbar Exercises: Stretches   Other Lumbar Stretch Exercise bilat shoulder flexion stretch, sliding hands up cabinets.      Lumbar Exercises: Aerobic   Other Aerobic Exercise 6 laps around gym for warm up. (treadmill too difficult for coordination)      Lumbar Exercises: Standing   Row Strengthening;Both;15 reps    Theraband Level (Row) Level 3 (Green)    Row Limitations cues for arm position    Other Standing Lumbar Exercises opp arm/ leg with arms on cabinet x 10 each    Other Standing Lumbar Exercises anti-rotation with green band x 8 each side.  reverse wood chop with yellow band x 5; D2 flexion with yellow band x 5 each side.  side stepping with tactile cues for upright posture x 18 ft Rt/lt x 2.  Balance Exercises - 03/05/21 0001       Balance Exercises: Standing   Standing Eyes Closed Solid surface;Foam/compliant surface;2 reps;10 secs   forward weight shift with foam surface   SLS Eyes open;Foam/compliant surface;2 reps;10 secs   LLE up to 4 sec without UE to steady.   Wall Bumps Shoulder;Hip;Eyes opened;10 reps    Rockerboard Lateral;EO;30 seconds    Tandem Gait Forward;Retro;Intermittent upper extremity support;2 reps    Partial Tandem Stance Eyes open;Foam/compliant surface;2 reps                     PT Long Term Goals - 03/03/21 1624       PT LONG TERM GOAL #1   Title Pt will be independent with HEP    Time 6    Period Weeks    Status On-going      PT LONG TERM GOAL #2   Title Pt will improve LE strenth to 4+/5 to tolerate standing with  decreased pain    Time 6    Period Weeks    Status Partially Met      PT LONG TERM GOAL #3   Title Pt will improve SLS on bilat LEs to >= 20 seconds to demo improved balance and decreased fall risk    Time 6    Period Weeks    Status Achieved      PT LONG TERM GOAL #4   Title Pt will improve 5x STS to <= 10 seconds to demo improved functional strength    Time 6    Period Weeks    Status Achieved                   Plan - 03/05/21 1232     Clinical Impression Statement Trialed more standing exercises instead of mat level ones, since he complains of most pain when in supine on firm surface, and the transitions to/from that position.  He tolerated all well, requiring minor tactile cues for coordination, posture and form. He has less balance on Lt leg with SLS. Progressing well towards remaining goals.    Rehab Potential Good    PT Frequency 2x / week    PT Duration 6 weeks    PT Treatment/Interventions Taping;Dry needling;Manual techniques;Patient/family education;Therapeutic exercise;Electrical Stimulation;Cryotherapy;Moist Heat;Therapeutic activities    PT Next Visit Plan progress postural strength and balance, modalities/manual as tolerated.  issue progressed HEP.    PT Home Exercise Plan [id number]    Consulted and Agree with Plan of Care Patient             Patient will benefit from skilled therapeutic intervention in order to improve the following deficits and impairments:  Pain, Impaired flexibility, Decreased strength, Decreased activity tolerance, Decreased balance  Visit Diagnosis: Muscle weakness (generalized)  Acute bilateral low back pain without sciatica  Balance problem     Problem List Patient Active Problem List   Diagnosis Date Noted   Alcohol withdrawal syndrome without complication ([place])    Gastritis and gastroduodenitis    Multiple gastric ulcers    Duodenal ulcer    Acute blood loss anemia    Alcohol withdrawal seizure with complication  (HCC)    GI bleed 01/19/2021   QT prolongation 01/19/2021   Electrolyte imbalance 02/06/2020   Macrocytic anemia 02/06/2020   Acute pain of right wrist 02/06/2020   Encounter for orthopedic follow-up care 01/28/2020   Open Colles' fracture of right radius 01/10/2020   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 Patient goals were partially met. Patient is being discharged due to not returning since the last visit.  Isabelle Course, PT,DPT10/12/2208:04 AM   Kerin Perna, PTA 03/05/21 12:49 PM   [facility] [address] [place], [place], [address] Phone: [phone]   Fax:  [phone]  Name: KADIN CANIPE MRN: [id number] Date of Birth: Feb 05, 1961

## 2021-03-10 ENCOUNTER — Encounter: Payer: BC Managed Care – PPO | Admitting: Physical Therapy

## 2021-03-12 ENCOUNTER — Encounter: Payer: BC Managed Care – PPO | Admitting: Physical Therapy

## 2021-03-17 ENCOUNTER — Encounter: Payer: BC Managed Care – PPO | Admitting: Physical Therapy

## 2021-03-23 DIAGNOSIS — U071 COVID-19: Secondary | ICD-10-CM | POA: Diagnosis not present

## 2021-03-23 DIAGNOSIS — Z20822 Contact with and (suspected) exposure to covid-19: Secondary | ICD-10-CM | POA: Diagnosis not present

## 2021-03-27 ENCOUNTER — Other Ambulatory Visit: Payer: Self-pay | Admitting: Medical-Surgical

## 2021-04-02 ENCOUNTER — Ambulatory Visit: Payer: BC Managed Care – PPO | Admitting: Medical-Surgical

## 2021-04-02 ENCOUNTER — Other Ambulatory Visit: Payer: Self-pay

## 2021-04-02 ENCOUNTER — Encounter: Payer: Self-pay | Admitting: Medical-Surgical

## 2021-04-02 VITALS — BP 172/111 | HR 73 | Temp 99.1°F | Wt 145.1 lb

## 2021-04-02 DIAGNOSIS — I10 Essential (primary) hypertension: Secondary | ICD-10-CM | POA: Diagnosis not present

## 2021-04-02 DIAGNOSIS — F10288 Alcohol dependence with other alcohol-induced disorder: Secondary | ICD-10-CM

## 2021-04-02 DIAGNOSIS — S32010D Wedge compression fracture of first lumbar vertebra, subsequent encounter for fracture with routine healing: Secondary | ICD-10-CM | POA: Diagnosis not present

## 2021-04-02 MED ORDER — LISINOPRIL 10 MG PO TABS: 10.0000 mg | ORAL_TABLET | Freq: Every day | ORAL | 3 refills | Status: DC

## 2021-04-02 MED ORDER — TRIAMCINOLONE ACETONIDE 0.5 % EX CREA: 1.0000 "application " | TOPICAL_CREAM | Freq: Two times a day (BID) | CUTANEOUS | 3 refills | Status: DC

## 2021-04-02 NOTE — Patient Instructions (Signed)
[facility]  [address]  [place], [name] [address].  [phone]

## 2021-04-02 NOTE — Addendum Note (Signed)
Addended byChristen Butter on: 04/02/2021 03:21 PM   Modules accepted: Orders

## 2021-04-02 NOTE — Progress Notes (Signed)
  HPI with pertinent ROS:   CC: back pain follow up  HPI: Pleasant 60 year old male presenting today for back pain follow-up.  He reports that he has been doing well overall and feels good today.  He does still have some back pain although he does admit that has gotten better since since our last visit.  He is still taking 1200 mg gabapentin 3 times daily.  Does still have tramadol at home and notes that he is not sure if that was even remotely helpful.  He is no longer doing formal physical therapy but is doing home exercises regularly.  Notes that he recently was diagnosed with COVID but has recovered well.  His blood pressure is elevated today.  Used to take lisinopril but this was stopped sometime last year.  Unsure why it was stopped but notes that it the provider quit prescribing it.Denies CP, SOB, palpitations, lower extremity edema, dizziness, headaches, or vision changes.  Did have 2 country ham biscuits earlier today and notes that they were very salty.  Continues to work on alcohol cessation although he has had a couple of episodes since our last visit where he did have drinks.  His last alcoholic beverage was Tuesday evening and he thinks he may have drank one half of a pint to 1 pint.  I reviewed the past medical history, family history, social history, surgical history, and allergies today and no changes were needed.  Please see the problem list section below in epic for further details.   Physical exam:   General: Well Developed, well nourished, and in no acute distress.  Neuro: Alert and oriented x3.  HEENT: Normocephalic, atraumatic.  Skin: Warm and dry. Cardiac: Regular rate and rhythm, no murmurs rubs or gallops, no lower extremity edema.  Respiratory: Clear to auscultation bilaterally. Not using accessory muscles, speaking in full sentences.  Impression and Recommendations:    1. Essential hypertension Restarting lisinopril 10 mg daily.  Recommend low-sodium diet.  Avoid  alcohol.  2. 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  Recommend avoiding all alcohol intake.  3. Compression fracture of L1 vertebra with routine healing, subsequent encounter Continue gabapentin as ordered.  Still has tramadol at home so no new prescription today.  Contact information for [facility] provided for him to reach out to get scheduled.  Referral already in place and authorized.  Return in about 2 weeks (around 04/16/2021) for nurse visit for BP check. ___________________________________________ Thayer Ohm, DNP, APRN, FNP-BC Primary Care and Sports Medicine [facility]

## 2021-04-06 ENCOUNTER — Ambulatory Visit: Payer: BC Managed Care – PPO | Admitting: Gastroenterology

## 2021-04-14 ENCOUNTER — Other Ambulatory Visit: Payer: Self-pay | Admitting: Medical-Surgical

## 2021-04-16 ENCOUNTER — Ambulatory Visit (INDEPENDENT_AMBULATORY_CARE_PROVIDER_SITE_OTHER): Payer: BC Managed Care – PPO | Admitting: Medical-Surgical

## 2021-04-16 ENCOUNTER — Other Ambulatory Visit: Payer: Self-pay

## 2021-04-16 VITALS — BP 154/87 | HR 78 | Temp 98.3°F | Ht 72.0 in | Wt 145.0 lb

## 2021-04-16 DIAGNOSIS — I10 Essential (primary) hypertension: Secondary | ICD-10-CM

## 2021-04-16 NOTE — Progress Notes (Signed)
Patient is here for blood pressure check.   Previous BP was 172/111  1st BP today: 154/87  2nd BP today (after 10  minutes): 143/79  Denies chest pain, dizziness, shortness of breath, severe headache, or nosebleeds.  Taking medication as prescribed. Denies missed doses.    Per [place]: Increase lisinopril to 20mg  QHS. Low sodium diet. Exercise as tolerated. No alcohol.  Pt expressed understanding. Recheck BP in 2 weeks.

## 2021-04-21 ENCOUNTER — Other Ambulatory Visit: Payer: Self-pay | Admitting: Medical-Surgical

## 2021-04-30 ENCOUNTER — Other Ambulatory Visit: Payer: Self-pay

## 2021-04-30 ENCOUNTER — Ambulatory Visit (INDEPENDENT_AMBULATORY_CARE_PROVIDER_SITE_OTHER): Payer: BC Managed Care – PPO | Admitting: Medical-Surgical

## 2021-04-30 VITALS — BP 148/86 | HR 69 | Wt 146.1 lb

## 2021-04-30 DIAGNOSIS — I10 Essential (primary) hypertension: Secondary | ICD-10-CM

## 2021-04-30 MED ORDER — LISINOPRIL 20 MG PO TABS: 20.0000 mg | ORAL_TABLET | Freq: Every day | ORAL | 3 refills | Status: DC

## 2021-04-30 NOTE — Progress Notes (Signed)
Established Patient Office Visit  Subjective:  Patient ID: Richard Andrews, male    DOB: [birth year]  Age: 60 y.o. MRN: [id number]  CC:  Chief Complaint  Patient presents with   Hypertension    HPI Richard Andrews presents for Hypertension  Past Medical History:  Diagnosis Date   Allergy    Distal radius fracture, left    Hypertension     Past Surgical History:  Procedure Laterality Date   BIOPSY  01/22/2021   Procedure: BIOPSY;  Surgeon: Lavena Bullion, DO;  Location: [facility];  Service: Gastroenterology;;   ESOPHAGOGASTRODUODENOSCOPY (EGD) WITH PROPOFOL N/A 01/22/2021   Procedure: ESOPHAGOGASTRODUODENOSCOPY (EGD) WITH PROPOFOL;  Surgeon: Lavena Bullion, DO;  Location: [place];  Service: Gastroenterology;  Laterality: N/A;   I & D EXTREMITY Right 01/10/2020   Procedure: ORIF RIGHT WRIST;  Surgeon: Roseanne Kaufman, MD;  Location: [facility];  Service: Orthopedics;  Laterality: Right;   OPEN REDUCTION INTERNAL FIXATION (ORIF) DISTAL RADIAL FRACTURE Left 01/28/2015   Procedure: OPEN REDUCTION INTERNAL FIXATION (ORIF) LEFT DISTAL RADIAL FRACTURE;  Surgeon: Leandrew Koyanagi, MD;  Location: [place];  Service: Orthopedics;  Laterality: Left;   TONSILLECTOMY      Family History  Problem Relation Age of Onset   Heart disease Mother    Hyperlipidemia Mother    Skin cancer Mother    Cancer Father    Multiple myeloma Sister    Throat cancer Sister     Social History   Socioeconomic History   Marital status: Married    Spouse name: Margreta Journey   Number of children: 5   Years of education: 12   Highest education level: Not on file  Occupational History   Occupation: Government social research officer  Tobacco Use   Smoking status: Every Day    Packs/day: 1.00    Years: 10.00    Pack years: 10.00    Types: Cigarettes   Smokeless tobacco: Current    Types: Chew  Vaping Use   Vaping Use: Never used  Substance and Sexual Activity   Alcohol use: Yes    Alcohol/week: 16.0  - 20.0 standard drinks    Types: 16 - 20 Standard drinks or equivalent per week    Comment: 1 gallon/week   Drug use: Never   Sexual activity: Yes    Partners: Female  Other Topics Concern   Not on file  Social History Narrative   Not on file   Social Determinants of Health   Financial Resource Strain: Not on file  Food Insecurity: Not on file  Transportation Needs: Not on file  Physical Activity: Not on file  Stress: Not on file  Social Connections: Not on file  Intimate Partner Violence: Not on file    Outpatient Medications Prior to Visit  Medication Sig Dispense Refill   Ascorbic Acid (VITAMIN C) 1000 MG tablet Take 1,000 mg by mouth daily.     calcitonin, salmon, (MIACALCIN) 200 UNIT/ACT nasal spray Place 1 spray into alternate nostrils daily. 3.7 mL 11   [employer] VITAMIN B12 1000 MCG tablet TAKE 1 TABLET BY MOUTH EVERY DAY 30 tablet 11   cyclobenzaprine (FLEXERIL) 10 MG tablet Take 1 tablet (10 mg total) by mouth 3 (three) times daily as needed for muscle spasms. 60 tablet 0   folic acid (FOLVITE) 1 MG tablet Take 1 tablet (1 mg total) by mouth daily. 30 tablet 1   gabapentin (NEURONTIN) 400 MG capsule TAKE 3 CAPSULES (1,200 MG TOTAL) BY  MOUTH 3 (THREE) TIMES DAILY. 270 capsule 1   lisinopril (ZESTRIL) 10 MG tablet Take 1 tablet (10 mg total) by mouth daily. 90 tablet 3   pantoprazole (PROTONIX) 40 MG tablet Take 1 tablet (40 mg total) by mouth 2 (two) times daily. 60 tablet 1   thiamine (VITAMIN B-1) 100 MG tablet Take 1 tablet (100 mg total) by mouth daily. 90 tablet 3   triamcinolone cream (KENALOG) 0.5 % Apply 1 application topically 2 (two) times daily. To affected areas. 30 g 3   sucralfate (CARAFATE) 1 g tablet Take 1 tablet (1 g total) by mouth 4 (four) times daily. 360 tablet 0   No facility-administered medications prior to visit.    No Known Allergies  ROS Review of Systems    Objective:    Physical Exam  BP (!) 148/86 (BP Location: Left Arm, Patient  Position: Sitting, Cuff Size: Normal)   Pulse 69   Wt 146 lb 1.3 oz (66.3 kg)   SpO2 100%   BMI 19.81 kg/m  Wt Readings from Last 3 Encounters:  04/30/21 146 lb 1.3 oz (66.3 kg)  04/16/21 145 lb (65.8 kg)  04/02/21 145 lb 1.3 oz (65.8 kg)     Health Maintenance Due  Topic Date Due   COVID-19 Vaccine (3 - Booster for Pfizer series) 05/08/2020    There are no preventive care reminders to display for this patient.  No results found for: TSH Lab Results  Component Value Date   WBC 3.9 02/01/2021   HGB 11.8 (L) 02/01/2021   HCT 35.6 (L) 02/01/2021   MCV 102.9 (H) 02/01/2021   PLT 260 02/01/2021   Lab Results  Component Value Date   NA 141 02/01/2021   K 4.2 02/01/2021   CO2 26 02/01/2021   GLUCOSE 93 02/01/2021   BUN 11 02/01/2021   CREATININE 0.99 02/01/2021   BILITOT 1.0 02/01/2021   ALKPHOS 93 01/24/2021   AST 48 (H) 02/01/2021   ALT 45 02/01/2021   PROT 7.9 02/01/2021   ALBUMIN 2.8 (L) 01/24/2021   CALCIUM 9.9 02/01/2021   ANIONGAP 5 01/24/2021   EGFR 87 02/01/2021   Lab Results  Component Value Date   CHOL 176 03/18/2016   Lab Results  Component Value Date   HDL 70 03/18/2016   Lab Results  Component Value Date   LDLCALC 94 03/18/2016   Lab Results  Component Value Date   TRIG 61 03/18/2016   Lab Results  Component Value Date   CHOLHDL 2.5 03/18/2016   Lab Results  Component Value Date   HGBA1C 5.2 03/18/2016      Assessment & Plan:  Patient is here for blood pressure check. Upon arrival his blood pressure was 149/83. He did disclose that he had not taken his medicine for the past 4 days due to running out and needing to take 2/day vs. 1/day.   Advised patient to sit for 10 minutes. On recheck, blood pressure was 148/86. We discussed the importance of consistency with taking the Lisinopril and made sure he has refills. Patient will follow up in 2 weeks for another blood pressure check.  Problem List Items Addressed This Visit    None   No orders of the defined types were placed in this encounter.   Follow-up: Return in about 2 weeks (around 05/14/2021) for Nurse visit blood pressure check .    Gust Brooms, CMA

## 2021-05-14 ENCOUNTER — Ambulatory Visit: Payer: BC Managed Care – PPO

## 2021-12-11 ENCOUNTER — Encounter (HOSPITAL_COMMUNITY): Payer: Self-pay

## 2021-12-11 ENCOUNTER — Other Ambulatory Visit: Payer: Self-pay

## 2021-12-11 ENCOUNTER — Emergency Department (HOSPITAL_COMMUNITY): Payer: BC Managed Care – PPO

## 2021-12-11 ENCOUNTER — Emergency Department (HOSPITAL_COMMUNITY)
Admission: EM | Admit: 2021-12-11 | Discharge: 2021-12-11 | Disposition: A | Discharge status: Home / Self Care | Payer: BC Managed Care – PPO | Attending: Emergency Medicine | Admitting: Emergency Medicine

## 2021-12-11 DIAGNOSIS — F10920 Alcohol use, unspecified with intoxication, uncomplicated: Secondary | ICD-10-CM

## 2021-12-11 DIAGNOSIS — Z79899 Other long term (current) drug therapy: Secondary | ICD-10-CM | POA: Diagnosis not present

## 2021-12-11 DIAGNOSIS — G930 Cerebral cysts: Secondary | ICD-10-CM | POA: Diagnosis not present

## 2021-12-11 DIAGNOSIS — F17200 Nicotine dependence, unspecified, uncomplicated: Secondary | ICD-10-CM | POA: Diagnosis not present

## 2021-12-11 DIAGNOSIS — R7401 Elevation of levels of liver transaminase levels: Secondary | ICD-10-CM | POA: Diagnosis not present

## 2021-12-11 DIAGNOSIS — D649 Anemia, unspecified: Secondary | ICD-10-CM | POA: Insufficient documentation

## 2021-12-11 DIAGNOSIS — F1010 Alcohol abuse, uncomplicated: Secondary | ICD-10-CM

## 2021-12-11 DIAGNOSIS — F10129 Alcohol abuse with intoxication, unspecified: Secondary | ICD-10-CM | POA: Diagnosis not present

## 2021-12-11 DIAGNOSIS — R531 Weakness: Secondary | ICD-10-CM | POA: Insufficient documentation

## 2021-12-11 DIAGNOSIS — I10 Essential (primary) hypertension: Secondary | ICD-10-CM | POA: Diagnosis not present

## 2021-12-11 DIAGNOSIS — F10120 Alcohol abuse with intoxication, uncomplicated: Secondary | ICD-10-CM | POA: Insufficient documentation

## 2021-12-11 DIAGNOSIS — Y908 Blood alcohol level of 240 mg/100 ml or more: Secondary | ICD-10-CM | POA: Insufficient documentation

## 2021-12-11 DIAGNOSIS — D696 Thrombocytopenia, unspecified: Secondary | ICD-10-CM | POA: Insufficient documentation

## 2021-12-11 LAB — COMPREHENSIVE METABOLIC PANEL
ALT: 49 U/L — ABNORMAL HIGH (ref 0–44)
AST: 115 U/L — ABNORMAL HIGH (ref 15–41)
Albumin: 4 g/dL (ref 3.5–5.0)
Alkaline Phosphatase: 61 U/L (ref 38–126)
Anion gap: 11 (ref 5–15)
BUN: 10 mg/dL (ref 8–23)
CO2: 24 mmol/L (ref 22–32)
Calcium: 9.2 mg/dL (ref 8.9–10.3)
Chloride: 105 mmol/L (ref 98–111)
Creatinine, Ser: 1.02 mg/dL (ref 0.61–1.24)
GFR, Estimated: >60 mL/min (ref >60)
Glucose, Bld: 140 mg/dL — ABNORMAL HIGH (ref 70–99)
Potassium: 3.4 mmol/L — ABNORMAL LOW (ref 3.5–5.1)
Sodium: 140 mmol/L (ref 135–145)
Total Bilirubin: 0.9 mg/dL (ref 0.3–1.2)
Total Protein: 7.8 g/dL (ref 6.5–8.1)

## 2021-12-11 LAB — RAPID URINE DRUG SCREEN, HOSP PERFORMED
Amphetamines: NOT DETECTED
Barbiturates: NOT DETECTED
Benzodiazepines: NOT DETECTED
Cocaine: NOT DETECTED
Opiates: NOT DETECTED
Tetrahydrocannabinol: NOT DETECTED

## 2021-12-11 LAB — SALICYLATE LEVEL: Salicylate Lvl: <7 mg/dL — ABNORMAL LOW (ref 7.0–30.0)

## 2021-12-11 LAB — CBC
HCT: 37 % — ABNORMAL LOW (ref 39.0–52.0)
Hemoglobin: 12.5 g/dL — ABNORMAL LOW (ref 13.0–17.0)
MCH: 35.4 pg — ABNORMAL HIGH (ref 26.0–34.0)
MCHC: 33.8 g/dL (ref 30.0–36.0)
MCV: 104.8 fL — ABNORMAL HIGH (ref 80.0–100.0)
Platelets: 82 10*3/uL — ABNORMAL LOW (ref 150–400)
RBC: 3.53 MIL/uL — ABNORMAL LOW (ref 4.22–5.81)
RDW: 12.8 % (ref 11.5–15.5)
WBC: 4.6 10*3/uL (ref 4.0–10.5)
nRBC: 0 % (ref 0.0–0.2)

## 2021-12-11 LAB — ACETAMINOPHEN LEVEL: Acetaminophen (Tylenol), Serum: <10 ug/mL — ABNORMAL LOW (ref 10–30)

## 2021-12-11 LAB — ETHANOL: Alcohol, Ethyl (B): 472 mg/dL (ref <10)

## 2021-12-11 MED ORDER — THIAMINE HCL 100 MG/ML IJ SOLN: 100.0000 mg | Freq: Every day | INTRAMUSCULAR | Status: DC

## 2021-12-11 MED ORDER — SODIUM CHLORIDE 0.9 % IV SOLN: INTRAVENOUS | Status: DC

## 2021-12-11 MED ORDER — LORAZEPAM 2 MG/ML IJ SOLN: 0.0000 mg | Freq: Two times a day (BID) | INTRAMUSCULAR | Status: DC

## 2021-12-11 MED ORDER — SODIUM CHLORIDE 0.9 % IV BOLUS
1000.0000 mL | Freq: Once | INTRAVENOUS | Status: AC
  Administered 2021-12-11: 1000 mL via INTRAVENOUS

## 2021-12-11 MED ORDER — THIAMINE HCL 100 MG PO TABS
100.0000 mg | ORAL_TABLET | Freq: Every day | ORAL | Status: DC
  Administered 2021-12-11: 100 mg via ORAL
  Filled 2021-12-11: qty 1

## 2021-12-11 MED ORDER — LORAZEPAM 1 MG PO TABS: 0.0000 mg | ORAL_TABLET | Freq: Four times a day (QID) | ORAL | Status: DC

## 2021-12-11 MED ORDER — LORAZEPAM 2 MG/ML IJ SOLN: 0.0000 mg | Freq: Four times a day (QID) | INTRAMUSCULAR | Status: DC

## 2021-12-11 MED ORDER — LORAZEPAM 1 MG PO TABS: 0.0000 mg | ORAL_TABLET | Freq: Two times a day (BID) | ORAL | Status: DC

## 2021-12-11 NOTE — Discharge Instructions (Addendum)
Follow-up with the outpatient substance abuse program.  Avoid drinking any alcohol.  Return for any new or worse symptoms.  Today's work-up without significant abnormalities.

## 2021-12-11 NOTE — ED Provider Notes (Signed)
[facility] EMERGENCY DEPARTMENT Provider Note   CSN: [id number] Arrival date & time: 12/11/21  1423     History  No chief complaint on file.   Richard Andrews is a 61 y.o. male.  Patient brought in by family members.  Patient is a heavy consumer of alcohol.  Patient now wanting to go through alcohol detox.  Patient's had trouble with significant DTs in the past.  Patient is also been having difficulty walking for a week.  Apparently both legs will not work well.  Patient has been through detox in the past.  Patient admits to drinking alcohol this morning.  Patient denies any suicidal homicidal ideation.  Past medical history significant for alcohol abuse hypertension.  Patient is an everyday smoker.  Patient also with history of significant GI bleed in August 2022.  Taking lisinopril for his blood pressure.  Blood pressure upon arrival 99/62.  Not tachycardic.      Home Medications Prior to Admission medications   Medication Sig Start Date End Date Taking? Authorizing Provider  Ascorbic Acid (VITAMIN C) 1000 MG tablet Take 1,000 mg by mouth daily.    [provider]  calcitonin, salmon, (MIACALCIN) 200 UNIT/ACT nasal spray Place 1 spray into alternate nostrils daily. 02/09/21   [name], NP  [employer] VITAMIN B12 1000 MCG tablet TAKE 1 TABLET BY MOUTH EVERY DAY 03/29/21   [name], NP  cyclobenzaprine (FLEXERIL) 10 MG tablet Take 1 tablet (10 mg total) by mouth 3 (three) times daily as needed for muscle spasms. 03/01/21   [name], NP  folic acid (FOLVITE) 1 MG tablet Take 1 tablet (1 mg total) by mouth daily. 01/26/21   Elgergawy, Leana Roe, MD  gabapentin (NEURONTIN) 400 MG capsule TAKE 3 CAPSULES (1,200 MG TOTAL) BY MOUTH 3 (THREE) TIMES DAILY. 04/21/21   [name], NP  lisinopril (ZESTRIL) 20 MG tablet Take 1 tablet (20 mg total) by mouth daily. 04/30/21   [name], NP  pantoprazole (PROTONIX) 40 MG tablet Take 1 tablet (40 mg total) by mouth 2 (two)  times daily. 01/25/21   Elgergawy, Leana Roe, MD  sucralfate (CARAFATE) 1 g tablet Take 1 tablet (1 g total) by mouth 4 (four) times daily. 01/25/21 04/25/21  Elgergawy, Leana Roe, MD  thiamine (VITAMIN B-1) 100 MG tablet Take 1 tablet (100 mg total) by mouth daily. 01/25/21   Elgergawy, Leana Roe, MD  triamcinolone cream (KENALOG) 0.5 % Apply 1 application topically 2 (two) times daily. To affected areas. 04/02/21   [name], NP      Allergies    Patient has no known allergies.    Review of Systems   Review of Systems  Constitutional:  Negative for chills and fever.  HENT:  Negative for ear pain and sore throat.   Eyes:  Negative for pain and visual disturbance.  Respiratory:  Negative for cough and shortness of breath.   Cardiovascular:  Negative for chest pain and palpitations.  Gastrointestinal:  Negative for abdominal pain and vomiting.  Genitourinary:  Negative for dysuria and hematuria.  Musculoskeletal:  Negative for arthralgias and back pain.  Skin:  Negative for color change and rash.  Neurological:  Positive for weakness. Negative for dizziness, seizures, syncope, facial asymmetry, speech difficulty, light-headedness, numbness and headaches.  All other systems reviewed and are negative.   Physical Exam Updated Vital Signs BP 105/68   Pulse 64   Temp 97.9 F (36.6 C) (Oral)   Resp 16   SpO2  97%  Physical Exam Vitals and nursing note reviewed.  Constitutional:      General: He is not in acute distress.    Appearance: Normal appearance. He is well-developed.  HENT:     Head: Normocephalic and atraumatic.     Mouth/Throat:     Mouth: Mucous membranes are moist.     Comments: Mucous membranes moist. Eyes:     Extraocular Movements: Extraocular movements intact.     Conjunctiva/sclera: Conjunctivae normal.     Pupils: Pupils are equal, round, and reactive to light.  Cardiovascular:     Rate and Rhythm: Normal rate and regular rhythm.     Heart sounds: No murmur  heard. Pulmonary:     Effort: Pulmonary effort is normal. No respiratory distress.     Breath sounds: Normal breath sounds.  Abdominal:     Palpations: Abdomen is soft.     Tenderness: There is no abdominal tenderness.  Musculoskeletal:        General: No swelling.     Cervical back: Normal range of motion and neck supple.  Skin:    General: Skin is warm and dry.     Capillary Refill: Capillary refill takes less than 2 seconds.  Neurological:     Mental Status: He is alert.     Cranial Nerves: No cranial nerve deficit.     Sensory: No sensory deficit.     Motor: No weakness.     Coordination: Coordination normal.     Comments: Patient not ambulated.  But no evidence of any significant focal deficit.  No motor weakness.  No sensation decreased cranial nerves intact.  Coordination intact.  Psychiatric:        Mood and Affect: Mood normal.    ED Results / Procedures / Treatments   Labs (all labs ordered are listed, but only abnormal results are displayed) Labs Reviewed  COMPREHENSIVE METABOLIC PANEL - Abnormal; Notable for the following components:      Result Value   Potassium 3.4 (*)    Glucose, Bld 140 (*)    AST 115 (*)    ALT 49 (*)    All other components within normal limits  ETHANOL - Abnormal; Notable for the following components:   Alcohol, Ethyl (B) 472 (*)    All other components within normal limits  SALICYLATE LEVEL - Abnormal; Notable for the following components:   Salicylate Lvl <7.0 (*)    All other components within normal limits  ACETAMINOPHEN LEVEL - Abnormal; Notable for the following components:   Acetaminophen (Tylenol), Serum <10 (*)    All other components within normal limits  CBC - Abnormal; Notable for the following components:   RBC 3.53 (*)    Hemoglobin 12.5 (*)    HCT 37.0 (*)    MCV 104.8 (*)    MCH 35.4 (*)    Platelets 82 (*)    All other components within normal limits  RAPID URINE DRUG SCREEN, HOSP PERFORMED     EKG None  Radiology No results found.  Procedures Procedures    Medications Ordered in ED Medications  sodium chloride 0.9 % bolus 1,000 mL (has no administration in time range)  0.9 %  sodium chloride infusion (has no administration in time range)  LORazepam (ATIVAN) injection 0-4 mg (has no administration in time range)    Or  LORazepam (ATIVAN) tablet 0-4 mg (has no administration in time range)  LORazepam (ATIVAN) injection 0-4 mg (has no administration in time range)  Or  LORazepam (ATIVAN) tablet 0-4 mg (has no administration in time range)  thiamine tablet 100 mg (has no administration in time range)    Or  thiamine (B-1) injection 100 mg (has no administration in time range)    ED Course/ Medical Decision Making/ A&P                           Medical Decision Making Amount and/or Complexity of Data Reviewed Labs: ordered. Radiology: ordered.  Risk OTC drugs. Prescription drug management.  Patient's blood alcohol level here is significantly elevated at 472.  Screening otherwise Tylenol level less than 10 aspirin level less than 7 CBC hemoglobin good at 12.5 no leukocytosis white counts 4.6 platelet platelets are low at 82,000.  Urine drug screen negative.  Complete metabolic panel potassium a little low at 3.4.  AST 115 ALT 4.9.  Total bili normal at 0.9 and renal function normal GFR is greater than 60.  No gap.  Patient is remained hemodynamically stable.  No evidence of alcohol withdrawal did not require any Ativan.  Head CT without any acute findings.  Patient wants to go home.  Apparently family has arranged outpatient substance abuse treatment program.  Final Clinical Impression(s) / ED Diagnoses Final diagnoses:  Alcoholic intoxication without complication (HCC)  Alcohol abuse    Rx / DC Orders ED Discharge Orders     None         Vanetta Mulders, MD 12/11/21 2213

## 2021-12-11 NOTE — ED Notes (Signed)
Discharge instructions reviewed with patient. Patient verbalized understanding of instructions. Follow-up care and medications were reviewed. Patient ambulatory with steady gait. VSS upon discharge.  ?

## 2021-12-11 NOTE — ED Triage Notes (Signed)
Patient reports that he has had difficulty walking x 1 week. Patient reports years of ETOH abuse and requesting help for same. Drank this am and smoker. Alert and oriented, denies SI/HI

## 2021-12-13 ENCOUNTER — Telehealth: Payer: Self-pay | Admitting: General Practice

## 2021-12-13 NOTE — Telephone Encounter (Signed)
Transition Care Management Follow-up Telephone Call Date of discharge and from where: 12/11/21 from [facility] How have you been since you were released from the hospital? Patient is going to [place] rehab- per his wife. Any questions or concerns? No  Items Reviewed: Did the pt receive and understand the discharge instructions provided? Yes  Medications obtained and verified? No  Other? No  Any new allergies since your discharge? No  Dietary orders reviewed? Yes Do you have support at home? Yes   Home Care and Equipment/Supplies: Were home health services ordered? no  Functional Questionnaire: (I = Independent and D = Dependent) ADLs: I  Bathing/Dressing- I  Meal Prep- I  Eating- I  Maintaining continence- I  Transferring/Ambulation- I  Managing Meds- I  Follow up appointments reviewed:  PCP Hospital f/u appt confirmed? No   Specialist Hospital f/u appt confirmed? No   Are transportation arrangements needed? No  If their condition worsens, is the pt aware to call PCP or go to the Emergency Dept.? Yes Was the patient provided with contact information for the PCP's office or ED? Yes Was to pt encouraged to call back with questions or concerns? Yes

## 2022-09-24 ENCOUNTER — Inpatient Hospital Stay (HOSPITAL_COMMUNITY): Payer: BC Managed Care – PPO

## 2022-09-24 ENCOUNTER — Emergency Department (HOSPITAL_COMMUNITY): Payer: BC Managed Care – PPO

## 2022-09-24 ENCOUNTER — Inpatient Hospital Stay (HOSPITAL_COMMUNITY)
Admission: EM | Admit: 2022-09-24 | Discharge: 2022-10-07 | DRG: 492 | Disposition: A | Discharge status: Rehabilitation Facility | Payer: BC Managed Care – PPO | Attending: General Surgery | Admitting: General Surgery

## 2022-09-24 ENCOUNTER — Encounter (HOSPITAL_COMMUNITY): Payer: Self-pay | Admitting: Pharmacy Technician

## 2022-09-24 ENCOUNTER — Other Ambulatory Visit: Payer: Self-pay

## 2022-09-24 DIAGNOSIS — I6203 Nontraumatic chronic subdural hemorrhage: Secondary | ICD-10-CM | POA: Diagnosis not present

## 2022-09-24 DIAGNOSIS — M25519 Pain in unspecified shoulder: Secondary | ICD-10-CM | POA: Diagnosis not present

## 2022-09-24 DIAGNOSIS — S065XAD Traumatic subdural hemorrhage with loss of consciousness status unknown, subsequent encounter: Secondary | ICD-10-CM | POA: Diagnosis not present

## 2022-09-24 DIAGNOSIS — S42211A Unspecified displaced fracture of surgical neck of right humerus, initial encounter for closed fracture: Secondary | ICD-10-CM | POA: Diagnosis not present

## 2022-09-24 DIAGNOSIS — Z8249 Family history of ischemic heart disease and other diseases of the circulatory system: Secondary | ICD-10-CM

## 2022-09-24 DIAGNOSIS — E871 Hypo-osmolality and hyponatremia: Secondary | ICD-10-CM | POA: Diagnosis not present

## 2022-09-24 DIAGNOSIS — R32 Unspecified urinary incontinence: Secondary | ICD-10-CM | POA: Diagnosis not present

## 2022-09-24 DIAGNOSIS — Z9181 History of falling: Secondary | ICD-10-CM

## 2022-09-24 DIAGNOSIS — Z8711 Personal history of peptic ulcer disease: Secondary | ICD-10-CM

## 2022-09-24 DIAGNOSIS — R400 Somnolence: Secondary | ICD-10-CM | POA: Diagnosis not present

## 2022-09-24 DIAGNOSIS — S42301A Unspecified fracture of shaft of humerus, right arm, initial encounter for closed fracture: Secondary | ICD-10-CM | POA: Diagnosis not present

## 2022-09-24 DIAGNOSIS — S42201A Unspecified fracture of upper end of right humerus, initial encounter for closed fracture: Secondary | ICD-10-CM | POA: Diagnosis not present

## 2022-09-24 DIAGNOSIS — R102 Pelvic and perineal pain: Secondary | ICD-10-CM | POA: Diagnosis not present

## 2022-09-24 DIAGNOSIS — F1020 Alcohol dependence, uncomplicated: Secondary | ICD-10-CM | POA: Diagnosis not present

## 2022-09-24 DIAGNOSIS — D539 Nutritional anemia, unspecified: Secondary | ICD-10-CM | POA: Diagnosis not present

## 2022-09-24 DIAGNOSIS — G8918 Other acute postprocedural pain: Secondary | ICD-10-CM | POA: Diagnosis not present

## 2022-09-24 DIAGNOSIS — M79605 Pain in left leg: Secondary | ICD-10-CM | POA: Diagnosis not present

## 2022-09-24 DIAGNOSIS — S42291A Other displaced fracture of upper end of right humerus, initial encounter for closed fracture: Principal | ICD-10-CM | POA: Diagnosis present

## 2022-09-24 DIAGNOSIS — S82145D Nondisplaced bicondylar fracture of left tibia, subsequent encounter for closed fracture with routine healing: Secondary | ICD-10-CM | POA: Diagnosis not present

## 2022-09-24 DIAGNOSIS — M25511 Pain in right shoulder: Secondary | ICD-10-CM | POA: Diagnosis not present

## 2022-09-24 DIAGNOSIS — I10 Essential (primary) hypertension: Secondary | ICD-10-CM | POA: Diagnosis present

## 2022-09-24 DIAGNOSIS — M542 Cervicalgia: Secondary | ICD-10-CM | POA: Diagnosis not present

## 2022-09-24 DIAGNOSIS — D696 Thrombocytopenia, unspecified: Secondary | ICD-10-CM | POA: Diagnosis present

## 2022-09-24 DIAGNOSIS — S51012A Laceration without foreign body of left elbow, initial encounter: Secondary | ICD-10-CM | POA: Diagnosis not present

## 2022-09-24 DIAGNOSIS — K5900 Constipation, unspecified: Secondary | ICD-10-CM | POA: Diagnosis not present

## 2022-09-24 DIAGNOSIS — E876 Hypokalemia: Secondary | ICD-10-CM | POA: Diagnosis not present

## 2022-09-24 DIAGNOSIS — Z79899 Other long term (current) drug therapy: Secondary | ICD-10-CM

## 2022-09-24 DIAGNOSIS — S065XAA Traumatic subdural hemorrhage with loss of consciousness status unknown, initial encounter: Secondary | ICD-10-CM | POA: Diagnosis not present

## 2022-09-24 DIAGNOSIS — S32402A Unspecified fracture of left acetabulum, initial encounter for closed fracture: Secondary | ICD-10-CM | POA: Diagnosis not present

## 2022-09-24 DIAGNOSIS — F17200 Nicotine dependence, unspecified, uncomplicated: Secondary | ICD-10-CM | POA: Diagnosis not present

## 2022-09-24 DIAGNOSIS — W19XXXA Unspecified fall, initial encounter: Secondary | ICD-10-CM | POA: Diagnosis not present

## 2022-09-24 DIAGNOSIS — R4182 Altered mental status, unspecified: Secondary | ICD-10-CM | POA: Diagnosis not present

## 2022-09-24 DIAGNOSIS — W010XXA Fall on same level from slipping, tripping and stumbling without subsequent striking against object, initial encounter: Secondary | ICD-10-CM | POA: Diagnosis present

## 2022-09-24 DIAGNOSIS — F17210 Nicotine dependence, cigarettes, uncomplicated: Secondary | ICD-10-CM | POA: Diagnosis not present

## 2022-09-24 DIAGNOSIS — D6959 Other secondary thrombocytopenia: Secondary | ICD-10-CM | POA: Diagnosis not present

## 2022-09-24 DIAGNOSIS — F1019 Alcohol abuse with unspecified alcohol-induced disorder: Secondary | ICD-10-CM | POA: Diagnosis not present

## 2022-09-24 DIAGNOSIS — D649 Anemia, unspecified: Secondary | ICD-10-CM | POA: Diagnosis not present

## 2022-09-24 DIAGNOSIS — S199XXA Unspecified injury of neck, initial encounter: Secondary | ICD-10-CM | POA: Diagnosis not present

## 2022-09-24 DIAGNOSIS — S065X0A Traumatic subdural hemorrhage without loss of consciousness, initial encounter: Secondary | ICD-10-CM | POA: Diagnosis not present

## 2022-09-24 DIAGNOSIS — I6200 Nontraumatic subdural hemorrhage, unspecified: Secondary | ICD-10-CM | POA: Diagnosis not present

## 2022-09-24 DIAGNOSIS — F1010 Alcohol abuse, uncomplicated: Secondary | ICD-10-CM | POA: Diagnosis not present

## 2022-09-24 DIAGNOSIS — M79621 Pain in right upper arm: Secondary | ICD-10-CM | POA: Diagnosis not present

## 2022-09-24 DIAGNOSIS — R471 Dysarthria and anarthria: Secondary | ICD-10-CM | POA: Diagnosis not present

## 2022-09-24 DIAGNOSIS — K5901 Slow transit constipation: Secondary | ICD-10-CM | POA: Diagnosis not present

## 2022-09-24 DIAGNOSIS — R4587 Impulsiveness: Secondary | ICD-10-CM | POA: Diagnosis not present

## 2022-09-24 DIAGNOSIS — S42201D Unspecified fracture of upper end of right humerus, subsequent encounter for fracture with routine healing: Secondary | ICD-10-CM | POA: Diagnosis not present

## 2022-09-24 DIAGNOSIS — Z83438 Family history of other disorder of lipoprotein metabolism and other lipidemia: Secondary | ICD-10-CM | POA: Diagnosis not present

## 2022-09-24 DIAGNOSIS — R4189 Other symptoms and signs involving cognitive functions and awareness: Secondary | ICD-10-CM | POA: Diagnosis not present

## 2022-09-24 DIAGNOSIS — F10929 Alcohol use, unspecified with intoxication, unspecified: Secondary | ICD-10-CM | POA: Diagnosis not present

## 2022-09-24 DIAGNOSIS — S82145S Nondisplaced bicondylar fracture of left tibia, sequela: Secondary | ICD-10-CM | POA: Diagnosis not present

## 2022-09-24 DIAGNOSIS — D61818 Other pancytopenia: Secondary | ICD-10-CM | POA: Diagnosis not present

## 2022-09-24 LAB — COMPREHENSIVE METABOLIC PANEL
ALT: 25 U/L (ref 0–44)
AST: 45 U/L — ABNORMAL HIGH (ref 15–41)
Albumin: 4.3 g/dL (ref 3.5–5.0)
Alkaline Phosphatase: 74 U/L (ref 38–126)
Anion gap: 12 (ref 5–15)
BUN: 10 mg/dL (ref 8–23)
CO2: 21 mmol/L — ABNORMAL LOW (ref 22–32)
Calcium: 8.5 mg/dL — ABNORMAL LOW (ref 8.9–10.3)
Chloride: 106 mmol/L (ref 98–111)
Creatinine, Ser: 0.88 mg/dL (ref 0.61–1.24)
GFR, Estimated: >60 mL/min (ref >60)
Glucose, Bld: 109 mg/dL — ABNORMAL HIGH (ref 70–99)
Potassium: 3.4 mmol/L — ABNORMAL LOW (ref 3.5–5.1)
Sodium: 139 mmol/L (ref 135–145)
Total Bilirubin: 0.7 mg/dL (ref 0.3–1.2)
Total Protein: 8.3 g/dL — ABNORMAL HIGH (ref 6.5–8.1)

## 2022-09-24 LAB — CBC WITH DIFFERENTIAL/PLATELET
Abs Immature Granulocytes: 0.02 10*3/uL (ref 0.00–0.07)
Basophils Absolute: 0 10*3/uL (ref 0.0–0.1)
Basophils Relative: 1 %
Eosinophils Absolute: 0.1 10*3/uL (ref 0.0–0.5)
Eosinophils Relative: 1 %
HCT: 38.5 % — ABNORMAL LOW (ref 39.0–52.0)
Hemoglobin: 13 g/dL (ref 13.0–17.0)
Immature Granulocytes: 0 %
Lymphocytes Relative: 19 %
Lymphs Abs: 1.2 10*3/uL (ref 0.7–4.0)
MCH: 35.4 pg — ABNORMAL HIGH (ref 26.0–34.0)
MCHC: 33.8 g/dL (ref 30.0–36.0)
MCV: 104.9 fL — ABNORMAL HIGH (ref 80.0–100.0)
Monocytes Absolute: 0.5 10*3/uL (ref 0.1–1.0)
Monocytes Relative: 8 %
Neutro Abs: 4.5 10*3/uL (ref 1.7–7.7)
Neutrophils Relative %: 71 %
Platelets: 120 10*3/uL — ABNORMAL LOW (ref 150–400)
RBC: 3.67 MIL/uL — ABNORMAL LOW (ref 4.22–5.81)
RDW: 12.7 % (ref 11.5–15.5)
WBC: 6.3 10*3/uL (ref 4.0–10.5)
nRBC: 0 % (ref 0.0–0.2)

## 2022-09-24 MED ORDER — ONDANSETRON HCL 4 MG/2ML IJ SOLN: 4.0000 mg | Freq: Four times a day (QID) | INTRAMUSCULAR | Status: DC | PRN

## 2022-09-24 MED ORDER — ONDANSETRON 4 MG PO TBDP: 4.0000 mg | ORAL_TABLET | Freq: Four times a day (QID) | ORAL | Status: DC | PRN

## 2022-09-24 MED ORDER — MORPHINE SULFATE (PF) 2 MG/ML IV SOLN
1.0000 mg | INTRAVENOUS | Status: DC | PRN
  Administered 2022-09-24 – 2022-09-28 (×13): 2 mg via INTRAVENOUS
  Filled 2022-09-24 (×13): qty 1

## 2022-09-24 MED ORDER — THIAMINE HCL 100 MG/ML IJ SOLN
100.0000 mg | Freq: Every day | INTRAMUSCULAR | Status: DC
  Filled 2022-09-24 (×3): qty 2

## 2022-09-24 MED ORDER — OXYCODONE HCL 5 MG PO TABS
10.0000 mg | ORAL_TABLET | ORAL | Status: DC | PRN
  Administered 2022-09-24 – 2022-10-06 (×16): 10 mg via ORAL
  Filled 2022-09-24 (×16): qty 2

## 2022-09-24 MED ORDER — OXYCODONE HCL 5 MG PO TABS
5.0000 mg | ORAL_TABLET | ORAL | Status: DC | PRN
  Administered 2022-10-01: 5 mg via ORAL
  Filled 2022-09-24 (×2): qty 1

## 2022-09-24 MED ORDER — ACETAMINOPHEN 325 MG PO TABS
650.0000 mg | ORAL_TABLET | ORAL | Status: DC | PRN
  Administered 2022-09-25: 650 mg via ORAL
  Filled 2022-09-24: qty 2

## 2022-09-24 MED ORDER — DOCUSATE SODIUM 100 MG PO CAPS
100.0000 mg | ORAL_CAPSULE | Freq: Two times a day (BID) | ORAL | Status: DC
  Administered 2022-09-24 – 2022-09-30 (×10): 100 mg via ORAL
  Filled 2022-09-24 (×11): qty 1

## 2022-09-24 MED ORDER — PANTOPRAZOLE SODIUM 40 MG IV SOLR
40.0000 mg | Freq: Every day | INTRAVENOUS | Status: DC
  Administered 2022-09-25: 40 mg via INTRAVENOUS
  Filled 2022-09-24 (×5): qty 10

## 2022-09-24 MED ORDER — ADULT MULTIVITAMIN W/MINERALS CH
1.0000 | ORAL_TABLET | Freq: Every day | ORAL | Status: DC
  Administered 2022-09-25 – 2022-10-07 (×13): 1 via ORAL
  Filled 2022-09-24 (×13): qty 1

## 2022-09-24 MED ORDER — LORAZEPAM 1 MG PO TABS
0.0000 mg | ORAL_TABLET | ORAL | Status: DC | PRN
  Administered 2022-09-24 – 2022-09-25 (×2): 2 mg via ORAL
  Filled 2022-09-24 (×3): qty 2

## 2022-09-24 MED ORDER — LACTATED RINGERS IV SOLN: INTRAVENOUS | Status: DC

## 2022-09-24 MED ORDER — FOLIC ACID 1 MG PO TABS
1.0000 mg | ORAL_TABLET | Freq: Every day | ORAL | Status: DC
  Administered 2022-09-25 – 2022-10-07 (×13): 1 mg via ORAL
  Filled 2022-09-24 (×13): qty 1

## 2022-09-24 MED ORDER — LORAZEPAM 1 MG PO TABS
1.0000 mg | ORAL_TABLET | ORAL | Status: DC | PRN
  Administered 2022-09-25: 2 mg via ORAL

## 2022-09-24 MED ORDER — HYDRALAZINE HCL 20 MG/ML IJ SOLN: 10.0000 mg | INTRAMUSCULAR | Status: DC | PRN

## 2022-09-24 MED ORDER — CHLORHEXIDINE GLUCONATE CLOTH 2 % EX PADS
6.0000 | MEDICATED_PAD | Freq: Every day | CUTANEOUS | Status: DC
  Administered 2022-09-25 – 2022-09-27 (×4): 6 via TOPICAL

## 2022-09-24 MED ORDER — POTASSIUM CHLORIDE IN NACL 20-0.9 MEQ/L-% IV SOLN
INTRAVENOUS | Status: DC
  Filled 2022-09-24 (×6): qty 1000

## 2022-09-24 MED ORDER — THIAMINE MONONITRATE 100 MG PO TABS
100.0000 mg | ORAL_TABLET | Freq: Every day | ORAL | Status: DC
  Administered 2022-09-25 – 2022-10-04 (×10): 100 mg via ORAL
  Filled 2022-09-24 (×10): qty 1

## 2022-09-24 MED ORDER — PANTOPRAZOLE SODIUM 40 MG PO TBEC
40.0000 mg | DELAYED_RELEASE_TABLET | Freq: Every day | ORAL | Status: DC
  Administered 2022-09-26 – 2022-10-04 (×9): 40 mg via ORAL
  Filled 2022-09-24 (×9): qty 1

## 2022-09-24 NOTE — Progress Notes (Signed)
Called regarding right proximal humerus fracture.  Patient being transferred to [facility] for neurosurgical and trauma care, intracranial bleed with left shift, also right proximal humerus fracture.  Plan for sling, CT scan, nonweightbearing right upper extremity, and he will likely need reverse arthroplasty once he has been optimized.  Full consult to follow tomorrow.  Teryl Lucy, MD

## 2022-09-24 NOTE — ED Triage Notes (Signed)
Pt bib ems from home, pt was drinking etoh with family and patient fell off of ramp going into the house onto R shoulder. +swelling and decreased ROM. Pt with midline cervical pain, arrives in C-collar. Family requested detox from EMS. Pt drinks 1 pint of whiskey daily. VSS.

## 2022-09-24 NOTE — ED Notes (Signed)
[facility] called for transport to [facility] at this time.

## 2022-09-24 NOTE — H&P (Addendum)
CC: I fell  Requesting provider: Dr Freida Busman  HPI: Richard Andrews is an 62 y.o. male who is here for evaluation after falling.  History is provided by the patient and the family member.  Patient states that he fell around 3:00 today.  He does not really remember much.  He landed on his right side.  Family member found him laying on the right side.  He had complained of right shoulder pain and was brought to [facility] emergency room.  He has a significant history of alcohol abuse.  He had a CT scan of his head and C-spine and a shoulder x-ray.  He was found to have a proximal humerus fracture along with a subdural hemorrhage and trauma surgery was consulted.  Denies any left upper extremity pain, neck pain, blurry vision, abdominal pain, chest pain, back pain, lower extremity pain.  Drinks about 1-1/2 pints of whiskey a day.  He smokes about a pack per day.  Denies any drug use.  Family ember states that the patient fell approximately 1 month ago and had a significant left forehead bruising and hematoma along with the superficial laceration.  He did not seek medical care at that time.  Family member showed me a picture.  Patient has a history of alcohol dependence, falls and hypertension along with an admission for alcohol abuse with DTs with acute blood loss anemia due to gastric duodenal ulcers in August 2022 Past Medical History:  Diagnosis Date   Allergy    Distal radius fracture, left    Hypertension     Past Surgical History:  Procedure Laterality Date   BIOPSY  01/22/2021   Procedure: BIOPSY;  Surgeon: Shellia Cleverly, DO;  Location: [facility] ENDOSCOPY;  Service: Gastroenterology;;   ESOPHAGOGASTRODUODENOSCOPY (EGD) WITH PROPOFOL N/A 01/22/2021   Procedure: ESOPHAGOGASTRODUODENOSCOPY (EGD) WITH PROPOFOL;  Surgeon: Shellia Cleverly, DO;  Location: [facility] ENDOSCOPY;  Service: Gastroenterology;  Laterality: N/A;   I & D EXTREMITY Right 01/10/2020   Procedure: ORIF RIGHT WRIST;  Surgeon:  Dominica Severin, MD;  Location: [facility] OR;  Service: Orthopedics;  Laterality: Right;   OPEN REDUCTION INTERNAL FIXATION (ORIF) DISTAL RADIAL FRACTURE Left 01/28/2015   Procedure: OPEN REDUCTION INTERNAL FIXATION (ORIF) LEFT DISTAL RADIAL FRACTURE;  Surgeon: Tarry Kos, MD;  Location: [facility];  Service: Orthopedics;  Laterality: Left;   TONSILLECTOMY      Family History  Problem Relation Age of Onset   Heart disease Mother    Hyperlipidemia Mother    Skin cancer Mother    Cancer Father    Multiple myeloma Sister    Throat cancer Sister     Social:  reports that he has been smoking cigarettes. He has a 10.00 pack-year smoking history. His smokeless tobacco use includes chew. He reports current alcohol use of about 16.0 - 20.0 standard drinks of alcohol per week. He reports that he does not use drugs.  Allergies: No Known Allergies  Medications: I have reviewed the patient's current medications.   ROS - all of the below systems have been reviewed with the patient and positives are indicated with bold text General: chills, fever or night sweats Eyes: blurry vision or double vision ENT: epistaxis or sore throat Allergy/Immunology: itchy/watery eyes or nasal congestion Hematologic/Lymphatic: bleeding problems, blood clots or swollen lymph nodes; see hpi Endocrine: temperature intolerance or unexpected weight changes Breast: new or changing breast lumps or nipple discharge Resp: cough, shortness of breath, or wheezing CV: chest pain or  dyspnea on exertion GI: as per HPI GU: dysuria, trouble voiding, or hematuria MSK: joint pain or joint stiffness; freq falls Neuro: TIA or stroke symptoms Derm: pruritus and skin lesion changes Psych: anxiety and depression  PE Blood pressure (!) 140/88, pulse 62, temperature 97.7 F (36.5 C), resp. rate 16, SpO2 97 %. Constitutional: NAD; conversant; no deformities Eyes: Moist conjunctiva; no lid lag; anicteric; PERRL; a little  muddy sclera Neck: Trachea midline; no thyromegaly, no collar, nontender C spine Lungs: Normal respiratory effort; no tactile fremitus CV: RRR; no palpable thrills; no pitting edema GI: Abd soft, nt, nd; no palpable hepatosplenomegaly MSK:  no clubbing/cyanosis; RUE in sling, TTP at Rt shoulder/prox humerus; nontender to palpation of LUE, b/l LE; strength 5/5 b/l LE, LUE Psychiatric: Appropriate affect; alert and oriented x3 (person, year, president, city/state) Lymphatic: No palpable cervical or axillary lymphadenopathy Skin:skin tear L elbow, bruising L forearm  Results for orders placed or performed during the hospital encounter of 09/24/22 (from the past 48 hour(s))  CBC with Differential/Platelet     Status: Abnormal   Collection Time: 09/24/22  5:15 PM  Result Value Ref Range   WBC 6.3 4.0 - 10.5 K/uL   RBC 3.67 (L) 4.22 - 5.81 MIL/uL   Hemoglobin 13.0 13.0 - 17.0 g/dL   HCT 16.1 (L) 09.6 - 04.5 %   MCV 104.9 (H) 80.0 - 100.0 fL   MCH 35.4 (H) 26.0 - 34.0 pg   MCHC 33.8 30.0 - 36.0 g/dL   RDW 40.9 81.1 - 91.4 %   Platelets 120 (L) 150 - 400 K/uL   nRBC 0.0 0.0 - 0.2 %   Neutrophils Relative % 71 %   Neutro Abs 4.5 1.7 - 7.7 K/uL   Lymphocytes Relative 19 %   Lymphs Abs 1.2 0.7 - 4.0 K/uL   Monocytes Relative 8 %   Monocytes Absolute 0.5 0.1 - 1.0 K/uL   Eosinophils Relative 1 %   Eosinophils Absolute 0.1 0.0 - 0.5 K/uL   Basophils Relative 1 %   Basophils Absolute 0.0 0.0 - 0.1 K/uL   Immature Granulocytes 0 %   Abs Immature Granulocytes 0.02 0.00 - 0.07 K/uL    Comment: Performed at [facility], [address] [place], [name] [address]  Comprehensive metabolic panel     Status: Abnormal   Collection Time: 09/24/22  5:15 PM  Result Value Ref Range   Sodium 139 135 - 145 mmol/L   Potassium 3.4 (L) 3.5 - 5.1 mmol/L   Chloride 106 98 - 111 mmol/L   CO2 21 (L) 22 - 32 mmol/L   Glucose, Bld 109 (H) 70 - 99 mg/dL    Comment: Glucose reference range  applies only to samples taken after fasting for at least 8 hours.   BUN 10 8 - 23 mg/dL   Creatinine, Ser 6.21 0.61 - 1.24 mg/dL   Calcium 8.5 (L) 8.9 - 10.3 mg/dL   Total Protein 8.3 (H) 6.5 - 8.1 g/dL   Albumin 4.3 3.5 - 5.0 g/dL   AST 45 (H) 15 - 41 U/L   ALT 25 0 - 44 U/L   Alkaline Phosphatase 74 38 - 126 U/L   Total Bilirubin 0.7 0.3 - 1.2 mg/dL   GFR, Estimated >30 >86 mL/min    Comment: (NOTE) Calculated using the CKD-EPI Creatinine Equation (2021)    Anion gap 12 5 - 15    Comment: Performed at [facility], [address] [place], [name]  [address]    CT Head Wo Contrast  Result Date: 09/24/2022 CLINICAL DATA:  Head trauma, abnormal mental status (Age 54-64y); Neck trauma, dangerous injury mechanism (Age 49-64y) EXAM: CT HEAD WITHOUT CONTRAST CT CERVICAL SPINE WITHOUT CONTRAST TECHNIQUE: Multidetector CT imaging of the head and cervical spine was performed following the standard protocol without intravenous contrast. Multiplanar CT image reconstructions of the cervical spine were also generated. RADIATION DOSE REDUCTION: This exam was performed according to the departmental dose-optimization program which includes automated exposure control, adjustment of the mA and/or kV according to patient size and/or use of iterative reconstruction technique. COMPARISON:  CT head a 07/02/2020. FINDINGS: CT HEAD FINDINGS Brain: Large (2.0 cm thick) mixed density right cerebral convexity subdural hemorrhage with hyperdense component posteriorly compatible with acute/recent hemorrhage. Trace leftward midline shift. No evidence of acute large vascular territory infarct, mass lesion or hydrocephalus. Patchy white matter hypodensities, compatible with chronic microvascular ischemic disease. Cerebral atrophy. Arachnoid cyst in the left aspect of the middle cranial fossa with mild mass effect on the adjacent temporal lobe, chronic and unchanged. Vascular: Calcific atherosclerosis. Skull:  No acute fracture. Sinuses/Orbits: Polyp versus retention cyst in the left maxillary sinus. Otherwise, largely clear sinuses. No acute orbital findings. Other: No mastoid effusions. CT CERVICAL SPINE FINDINGS Alignment: Straightening.  No substantial sagittal subluxation. Skull base and vertebrae: No acute fracture. Vertebral body heights are maintained. Soft tissues and spinal canal: No prevertebral fluid or swelling. No visible canal hematoma. Disc levels:  Overall mild bony degenerative change. Upper chest: Visualized lung apices are clear. IMPRESSION: 1. Large (2.0 cm thick) mixed density right cerebral convexity subdural hemorrhage with hyperdense component posteriorly compatible with acute/recent hemorrhage. Trace leftward midline shift. 2. No evidence of acute fracture or traumatic malalignment in the cervical spine. Electronically Signed   By: Feliberto Harts M.D.   On: 09/24/2022 17:07   CT Cervical Spine Wo Contrast  Result Date: 09/24/2022 CLINICAL DATA:  Head trauma, abnormal mental status (Age 22-64y); Neck trauma, dangerous injury mechanism (Age 17-64y) EXAM: CT HEAD WITHOUT CONTRAST CT CERVICAL SPINE WITHOUT CONTRAST TECHNIQUE: Multidetector CT imaging of the head and cervical spine was performed following the standard protocol without intravenous contrast. Multiplanar CT image reconstructions of the cervical spine were also generated. RADIATION DOSE REDUCTION: This exam was performed according to the departmental dose-optimization program which includes automated exposure control, adjustment of the mA and/or kV according to patient size and/or use of iterative reconstruction technique. COMPARISON:  CT head a 07/02/2020. FINDINGS: CT HEAD FINDINGS Brain: Large (2.0 cm thick) mixed density right cerebral convexity subdural hemorrhage with hyperdense component posteriorly compatible with acute/recent hemorrhage. Trace leftward midline shift. No evidence of acute large vascular territory infarct,  mass lesion or hydrocephalus. Patchy white matter hypodensities, compatible with chronic microvascular ischemic disease. Cerebral atrophy. Arachnoid cyst in the left aspect of the middle cranial fossa with mild mass effect on the adjacent temporal lobe, chronic and unchanged. Vascular: Calcific atherosclerosis. Skull: No acute fracture. Sinuses/Orbits: Polyp versus retention cyst in the left maxillary sinus. Otherwise, largely clear sinuses. No acute orbital findings. Other: No mastoid effusions. CT CERVICAL SPINE FINDINGS Alignment: Straightening.  No substantial sagittal subluxation. Skull base and vertebrae: No acute fracture. Vertebral body heights are maintained. Soft tissues and spinal canal: No prevertebral fluid or swelling. No visible canal hematoma. Disc levels:  Overall mild bony degenerative change. Upper chest: Visualized lung apices are clear. IMPRESSION: 1. Large (2.0 cm thick) mixed density right cerebral convexity subdural hemorrhage with hyperdense component posteriorly  compatible with acute/recent hemorrhage. Trace leftward midline shift. 2. No evidence of acute fracture or traumatic malalignment in the cervical spine. Electronically Signed   By: Feliberto Harts M.D.   On: 09/24/2022 17:07   DG Shoulder Right  Result Date: 09/24/2022 CLINICAL DATA:  Shoulder pain after fall EXAM: RIGHT SHOULDER - 3 VIEW COMPARISON:  None Available. FINDINGS: There is a comminuted fracture of the humeral head involving at least the greater tuberosity and the surgical neck, and likely involving the lesser tuberosity. There is moderate foreshortening. No dislocation. No additional acute fracture visualized. Moderate overlying soft tissue swelling. The visualized right lung is clear. IMPRESSION: Comminuted right humeral head fracture with moderate foreshortening, as described above. Electronically Signed   By: Jacob Moores M.D.   On: 09/24/2022 16:17    Imaging: Personally reviewed A/P: KENITH TRICKEL is  an 62 y.o. male with s/p fall L right subdural hemorrhage with slight shift Right comminuted humeral head fracture Alcohol abuse History of macrocytic anemia  Thrombocytopenia Mild hypokalemia L elbow skin tear Tobacco abuse History gastric/duodenal ulcers  Admit to trauma ICU Follow-up labs (INR) & pending imaging No NSAIDs given history of ulcers Dr. Freida Busman consulted neurosurgery (dr Yetta Barre) and orthopedics (Dr Dion Saucier) Follow-up their recommendations Serial neuroexams Keppra per NSG Repeat labs in the morning Hold chemical VTE prophylaxis until approved by neurosurgery CIWA protocol Humerus fx - "Plan for sling, CT scan, nonweightbearing right upper extremity, and he will likely need reverse arthroplasty once he has been optimized. "  Discussed pt with receiving trauma surgeon Dr Cliffton Asters at [facility]  Data reviewed: CT head/cspine 4//13/24, labs 09/24/22, ED note 01/10/2021, hosp dc summary 8/22; egd 01/2021; echocardiogram 2022; vitals today; ED notes from today  High level of medical decision making-prognosis unclear, extensive/complex review of medical data  Mary Sella. Andrey Campanile, MD, FACS General, Bariatric, & Minimally Invasive Surgery [facility]

## 2022-09-24 NOTE — ED Notes (Signed)
Dr Andrey Campanile to bedside to assess pt

## 2022-09-24 NOTE — Progress Notes (Signed)
Patient ID: Richard Andrews, male   DOB: 1961/01/30, 62 y.o.   MRN: [id number] We were called earlier about this patient who is a 62 year old male with a history of alcohol use he had a fall earlier today falling on his right side and suffering a humerus fracture that is likely going to need fixation.  ET scan of the head showed a large right subacute subdural hematoma.  I suspect this is at least a couple of weeks old and likely from his previous fall that was documented in the chart.  He seems to be doing fairly well neurologically but we have not had a chance to see him and await his arrival here at the hospital.  Full consultation will follow after his arrival.  If he is minimally symptomatic from the subdural hematoma he may be a candidate for MMA embolization.  If he is symptomatic then we would recommend subdural drainage.  If he is doing okay this is not emergent.  Repeat head CT tomorrow or if there are mental status changes.

## 2022-09-24 NOTE — ED Provider Notes (Addendum)
[place] EMERGENCY DEPARTMENT AT [facility] Provider Note   CSN: [id number] Arrival date & time: 09/24/22  1522     History  Chief Complaint  Patient presents with   Fall   Shoulder Pain    Richard Andrews is a 62 y.o. male.  62 year old male with history of alcohol abuse presents after mechanical fall just prior to arrival.  Patient states he drinks a pint of liquor a day.  Today he lost his balance and fell onto his right shoulder.  Did not strike his head did not lose any consciousness.  Denies any blood thinner use.  Complains of severe pain worse with movement of his right shoulder.  Denies any chest or abdominal discomfort.  No recent history of emesis.  Presents via EMS in a c-collar       Home Medications Prior to Admission medications   Medication Sig Start Date End Date Taking? Authorizing Provider  calcitonin, salmon, (MIACALCIN) 200 UNIT/ACT nasal spray Place 1 spray into alternate nostrils daily. Patient not taking: Reported on 12/11/2021 02/09/21   [name], NP  [employer] VITAMIN B12 1000 MCG tablet TAKE 1 TABLET BY MOUTH EVERY DAY Patient not taking: Reported on 12/11/2021 03/29/21   [name], NP  cyclobenzaprine (FLEXERIL) 10 MG tablet Take 1 tablet (10 mg total) by mouth 3 (three) times daily as needed for muscle spasms. Patient not taking: Reported on 12/11/2021 03/01/21   [name], NP  folic acid (FOLVITE) 1 MG tablet Take 1 tablet (1 mg total) by mouth daily. Patient not taking: Reported on 12/11/2021 01/26/21   Elgergawy, Leana Roe, MD  gabapentin (NEURONTIN) 400 MG capsule TAKE 3 CAPSULES (1,200 MG TOTAL) BY MOUTH 3 (THREE) TIMES DAILY. Patient not taking: Reported on 12/11/2021 04/21/21   [name], NP  lisinopril (ZESTRIL) 20 MG tablet Take 1 tablet (20 mg total) by mouth daily. 04/30/21   [name], NP  pantoprazole (PROTONIX) 40 MG tablet Take 1 tablet (40 mg total) by mouth 2 (two) times daily. Patient not taking: Reported on 12/11/2021 01/25/21    Elgergawy, Leana Roe, MD  sucralfate (CARAFATE) 1 g tablet Take 1 tablet (1 g total) by mouth 4 (four) times daily. 01/25/21 04/25/21  Elgergawy, Leana Roe, MD  thiamine (VITAMIN B-1) 100 MG tablet Take 1 tablet (100 mg total) by mouth daily. Patient not taking: Reported on 12/11/2021 01/25/21   Elgergawy, Leana Roe, MD  triamcinolone cream (KENALOG) 0.5 % Apply 1 application topically 2 (two) times daily. To affected areas. Patient not taking: Reported on 12/11/2021 04/02/21   [name], NP      Allergies    Patient has no known allergies.    Review of Systems   Review of Systems  All other systems reviewed and are negative.   Physical Exam Updated Vital Signs BP (!) 140/88   Pulse 62   Temp 97.7 F (36.5 C)   Resp 16   SpO2 97%  Physical Exam Vitals and nursing note reviewed.  Constitutional:      General: He is not in acute distress.    Appearance: Normal appearance. He is well-developed. He is not toxic-appearing.  HENT:     Head: Normocephalic and atraumatic.  Eyes:     General: Lids are normal.     Conjunctiva/sclera: Conjunctivae normal.     Pupils: Pupils are equal, round, and reactive to light.  Neck:     Thyroid: No thyroid mass.     Trachea: No tracheal deviation.  Cardiovascular:     Rate and Rhythm: Normal rate and regular rhythm.     Heart sounds: Normal heart sounds. No murmur heard.    No gallop.  Pulmonary:     Effort: Pulmonary effort is normal. No respiratory distress.     Breath sounds: Normal breath sounds. No stridor. No decreased breath sounds, wheezing, rhonchi or rales.  Abdominal:     General: There is no distension.     Palpations: Abdomen is soft.     Tenderness: There is no abdominal tenderness. There is no rebound.  Musculoskeletal:        General: No tenderness.     Right shoulder: Bony tenderness present. Decreased range of motion.       Arms:     Cervical back: Normal range of motion and neck supple.     Comments: Neurovasc intact at  right hand  Skin:    General: Skin is warm and dry.     Findings: No abrasion or rash.  Neurological:     Mental Status: He is alert and oriented to person, place, and time. Mental status is at baseline.     GCS: GCS eye subscore is 4. GCS verbal subscore is 5. GCS motor subscore is 6.     Cranial Nerves: No cranial nerve deficit.     Sensory: No sensory deficit.     Motor: Motor function is intact.  Psychiatric:        Attention and Perception: Attention normal.        Speech: Speech normal.        Behavior: Behavior normal.     ED Results / Procedures / Treatments   Labs (all labs ordered are listed, but only abnormal results are displayed) Labs Reviewed - No data to display  EKG None  Radiology No results found.  Procedures Procedures    Medications Ordered in ED Medications - No data to display  ED Course/ Medical Decision Making/ A&P                             Medical Decision Making Amount and/or Complexity of Data Reviewed Labs: ordered. Radiology: ordered.  Risk Prescription drug management.   Patient's x-ray of his right shoulder shows a humeral neck fracture.  Will consult orthopedics for this.  Patient placed in a splint.  Patient also had a CT of his head which shows right-sided subdural hematoma measuring 2 cm with some shift to the left.  Discussed with neurosurgery team at 5:15 PM and they will see patient in consultation.  Discussed with Dr. Dion Saucier orthopedic surgery at 5:33 PM he will see the patient as well.  Discussed with Dr. Andrey Campanile who will come and admit the patient to trauma service at [facility].  Discussed results with patient and ex-wife at bedside  CRITICAL CARE Performed by: Toy Baker Total critical care time: 45 minutes Critical care time was exclusive of separately billable procedures and treating other patients. Critical care was necessary to treat or prevent imminent or life-threatening deterioration. Critical care was time  spent personally by me on the following activities: development of treatment plan with patient and/or surrogate as well as nursing, discussions with consultants, evaluation of patient's response to treatment, examination of patient, obtaining history from patient or surrogate, ordering and performing treatments and interventions, ordering and review of laboratory studies, ordering and review of radiographic studies, pulse oximetry and re-evaluation of patient's condition.  Final Clinical Impression(s) / ED Diagnoses Final diagnoses:  None    Rx / DC Orders ED Discharge Orders     None         Lorre Nick, MD 09/24/22 1717    Lorre Nick, MD 09/24/22 1733

## 2022-09-25 ENCOUNTER — Encounter (HOSPITAL_COMMUNITY): Payer: Self-pay

## 2022-09-25 ENCOUNTER — Inpatient Hospital Stay (HOSPITAL_COMMUNITY): Payer: BC Managed Care – PPO

## 2022-09-25 LAB — COMPREHENSIVE METABOLIC PANEL
ALT: 24 U/L (ref 0–44)
AST: 38 U/L (ref 15–41)
Albumin: 3.6 g/dL (ref 3.5–5.0)
Alkaline Phosphatase: 67 U/L (ref 38–126)
Anion gap: 9 (ref 5–15)
BUN: 7 mg/dL — ABNORMAL LOW (ref 8–23)
CO2: 21 mmol/L — ABNORMAL LOW (ref 22–32)
Calcium: 8.2 mg/dL — ABNORMAL LOW (ref 8.9–10.3)
Chloride: 106 mmol/L (ref 98–111)
Creatinine, Ser: 0.79 mg/dL (ref 0.61–1.24)
GFR, Estimated: >60 mL/min (ref >60)
Glucose, Bld: 140 mg/dL — ABNORMAL HIGH (ref 70–99)
Potassium: 3.1 mmol/L — ABNORMAL LOW (ref 3.5–5.1)
Sodium: 136 mmol/L (ref 135–145)
Total Bilirubin: 1 mg/dL (ref 0.3–1.2)
Total Protein: 7.1 g/dL (ref 6.5–8.1)

## 2022-09-25 LAB — CBC
HCT: 33.6 % — ABNORMAL LOW (ref 39.0–52.0)
Hemoglobin: 11.7 g/dL — ABNORMAL LOW (ref 13.0–17.0)
MCH: 35.6 pg — ABNORMAL HIGH (ref 26.0–34.0)
MCHC: 34.8 g/dL (ref 30.0–36.0)
MCV: 102.1 fL — ABNORMAL HIGH (ref 80.0–100.0)
Platelets: 125 10*3/uL — ABNORMAL LOW (ref 150–400)
RBC: 3.29 MIL/uL — ABNORMAL LOW (ref 4.22–5.81)
RDW: 12.9 % (ref 11.5–15.5)
WBC: 6.2 10*3/uL (ref 4.0–10.5)
nRBC: 0 % (ref 0.0–0.2)

## 2022-09-25 LAB — HIV ANTIBODY (ROUTINE TESTING W REFLEX): HIV Screen 4th Generation wRfx: NONREACTIVE

## 2022-09-25 LAB — MRSA NEXT GEN BY PCR, NASAL: MRSA by PCR Next Gen: NOT DETECTED

## 2022-09-25 MED ORDER — LORAZEPAM 1 MG PO TABS
1.0000 mg | ORAL_TABLET | ORAL | Status: AC | PRN
  Administered 2022-09-26 (×2): 2 mg via ORAL
  Administered 2022-09-26: 4 mg via ORAL
  Administered 2022-09-26 (×2): 2 mg via ORAL
  Administered 2022-09-27: 1 mg via ORAL
  Administered 2022-09-28: 2 mg via ORAL
  Filled 2022-09-25: qty 2
  Filled 2022-09-25: qty 1
  Filled 2022-09-25 (×4): qty 2
  Filled 2022-09-25: qty 4

## 2022-09-25 MED ORDER — LORAZEPAM 2 MG/ML IJ SOLN
1.0000 mg | INTRAMUSCULAR | Status: AC | PRN
  Administered 2022-09-26 – 2022-09-28 (×2): 2 mg via INTRAVENOUS
  Filled 2022-09-25 (×2): qty 1

## 2022-09-25 MED ORDER — CHLORDIAZEPOXIDE HCL 25 MG PO CAPS
25.0000 mg | ORAL_CAPSULE | Freq: Three times a day (TID) | ORAL | Status: DC
  Administered 2022-09-25 – 2022-09-30 (×15): 25 mg via ORAL
  Filled 2022-09-25 (×15): qty 1

## 2022-09-25 MED ORDER — POTASSIUM CHLORIDE CRYS ER 20 MEQ PO TBCR
40.0000 meq | EXTENDED_RELEASE_TABLET | ORAL | Status: AC
  Administered 2022-09-25 (×2): 40 meq via ORAL
  Filled 2022-09-25 (×2): qty 2

## 2022-09-25 MED ORDER — CHLORDIAZEPOXIDE HCL 25 MG PO CAPS: 25.0000 mg | ORAL_CAPSULE | Freq: Three times a day (TID) | ORAL | Status: DC

## 2022-09-25 NOTE — Consult Note (Signed)
Reason for Consult: Subdural hematoma Referring Physician: EDP  Richard Andrews is an 62 y.o. male.   HPI:  62 year old gentleman with a history of alcohol use was then after a fall and has a broken humerus.  Incidentally found was a subacute subdural hematoma on the right.  He is having no headaches visual changes or numbness tingling or weakness.  He fell about 3 weeks ago and did hit his head.  This matches the timing.  He needs surgical intervention of the right humerus.  Past Medical History:  Diagnosis Date   Allergy    Distal radius fracture, left    Hypertension     Past Surgical History:  Procedure Laterality Date   BIOPSY  01/22/2021   Procedure: BIOPSY;  Surgeon: Shellia Cleverly, DO;  Location: [facility];  Service: Gastroenterology;;   ESOPHAGOGASTRODUODENOSCOPY (EGD) WITH PROPOFOL N/A 01/22/2021   Procedure: ESOPHAGOGASTRODUODENOSCOPY (EGD) WITH PROPOFOL;  Surgeon: Shellia Cleverly, DO;  Location: [facility];  Service: Gastroenterology;  Laterality: N/A;   I & D EXTREMITY Right 01/10/2020   Procedure: ORIF RIGHT WRIST;  Surgeon: Dominica Severin, MD;  Location: [facility] OR;  Service: Orthopedics;  Laterality: Right;   OPEN REDUCTION INTERNAL FIXATION (ORIF) DISTAL RADIAL FRACTURE Left 01/28/2015   Procedure: OPEN REDUCTION INTERNAL FIXATION (ORIF) LEFT DISTAL RADIAL FRACTURE;  Surgeon: Tarry Kos, MD;  Location: [facility];  Service: Orthopedics;  Laterality: Left;   TONSILLECTOMY      No Known Allergies  Social History   Tobacco Use   Smoking status: Every Day    Packs/day: 1.00    Years: 10.00    Additional pack years: 0.00    Total pack years: 10.00    Types: Cigarettes   Smokeless tobacco: Current    Types: Chew  Substance Use Topics   Alcohol use: Yes    Alcohol/week: 16.0 - 20.0 standard drinks of alcohol    Types: 16 - 20 Standard drinks or equivalent per week    Comment: 1 gallon/week    Family History  Problem Relation Age of Onset    Heart disease Mother    Hyperlipidemia Mother    Skin cancer Mother    Cancer Father    Multiple myeloma Sister    Throat cancer Sister      Review of Systems  Positive ROS: neg  All other systems have been reviewed and were otherwise negative with the exception of those mentioned in the HPI and as above.  Objective: Vital signs in last 24 hours: Temp:  [97.7 F (36.5 C)-98.6 F (37 C)] 98.6 F (37 C) (04/14 0800) Pulse Rate:  [62-86] 74 (04/14 0800) Resp:  [13-96] 13 (04/14 0800) BP: (95-140)/(61-92) 139/88 (04/14 0800) SpO2:  [92 %-98 %] 94 % (04/14 0800)  General Appearance: Alert, cooperative, no distress, appears stated age Head: Normocephalic, without obvious abnormality, atraumatic Eyes: PERRL, conjunctiva/corneas clear, EOM's intac Throat: benign Neck: Supple, symmetrical, trachea midline Lungs: respirations unlabored Heart: Regular rate and rhythm Abdomen: Soft Extremities: Right upper extremity in a sling Pulses: 2+ and symmetric all extremities Skin: Skin color, texture, turgor normal, no rashes or lesions  NEUROLOGIC:   Mental status: A&O x4, no aphasia, good attention span, Memory and fund of knowledge.  To be appropriate Motor Exam - grossly normal, normal tone and bulk Sensory Exam - grossly normal Reflexes: symmetric, no pathologic reflexes, No Hoffman's, No clonus Coordination - grossly normal Gait -not tested Balance -not tested Cranial Nerves: I: smell Not tested  II: visual acuity  OS: na    OD: nma  II: visual fields Full to confrontation  II: pupils Equal, round, reactive to light  III,VII: ptosis None  III,IV,VI: extraocular muscles  Full ROM  V: mastication Normal  V: facial light touch sensation  Normal  V,VII: corneal reflex  Present  VII: facial muscle function - upper  Normal  VII: facial muscle function - lower Normal  VIII: hearing Not tested  IX: soft palate elevation  Normal  IX,X: gag reflex Present  XI: trapezius strength   5/5  XI: sternocleidomastoid strength 5/5  XI: neck flexion strength  5/5  XII: tongue strength  Normal    Data Review Lab Results  Component Value Date   WBC 6.2 09/25/2022   HGB 11.7 (L) 09/25/2022   HCT 33.6 (L) 09/25/2022   MCV 102.1 (H) 09/25/2022   PLT 125 (L) 09/25/2022   Lab Results  Component Value Date   NA 136 09/25/2022   K 3.1 (L) 09/25/2022   CL 106 09/25/2022   CO2 21 (L) 09/25/2022   BUN 7 (L) 09/25/2022   CREATININE 0.79 09/25/2022   GLUCOSE 140 (H) 09/25/2022   Lab Results  Component Value Date   INR 1.4 (H) 01/19/2021    Radiology: CT HEAD WO CONTRAST ( )  Result Date: 09/25/2022 CLINICAL DATA:  Follow-up subdural hematoma EXAM: CT HEAD WITHOUT CONTRAST TECHNIQUE: Contiguous axial images were obtained from the base of the skull through the vertex without intravenous contrast. RADIATION DOSE REDUCTION: This exam was performed according to the departmental dose-optimization program which includes automated exposure control, adjustment of the mA and/or kV according to patient size and/or use of iterative reconstruction technique. COMPARISON:  Yesterday FINDINGS: Brain: Subdural hematoma with hematocrit level on the right is unchanged at up to 20 mm in thickness with local cortical mass effect. Arachnoid cyst in the left middle cranial fossa with low-density collection along the floor. The arachnoid cyst itself measures up to 4 cm and the collection measures up to 5 mm in thickness. No evidence of acute infarct, hydrocephalus, or mass. Vascular: No hyperdense vessel or unexpected calcification. Skull: Normal. Negative for fracture or focal lesion. Sinuses/Orbits: No acute finding. IMPRESSION: 1. Unchanged subdural hematoma on the right measuring up to 2 cm in thickness with local cortical mass effect. 2. Arachnoid cyst in the left middle cranial fossa with unchanged small adjacent subdural collection that is low-density. Electronically Signed   By: Tiburcio Pea  M.D.   On: 09/25/2022 06:09   CT SHOULDER RIGHT WO CONTRAST  Result Date: 09/24/2022 CLINICAL DATA:  Fall, right shoulder pain/fracture EXAM: CT OF THE UPPER RIGHT EXTREMITY WITHOUT CONTRAST TECHNIQUE: Multidetector CT imaging of the upper right extremity was performed according to the standard protocol. RADIATION DOSE REDUCTION: This exam was performed according to the departmental dose-optimization program which includes automated exposure control, adjustment of the mA and/or kV according to patient size and/or use of iterative reconstruction technique. COMPARISON:  None Available. FINDINGS: Comminuted, impacted right proximal humeral fracture. While there are numerous fragments, there are 3 dominant components with a dominant impacted transverse surgical neck fracture, a large humeral head component which has slipped/rotated medially, and a dominant greater tuberosity fragment with numerous smaller fragments. Right anterolateral 3rd and 4th rib fractures (series 4/images 78 and 93), favored to be subacute/healing. IMPRESSION: Comminuted, impacted right proximal humerus fracture, as above. Right anterolateral 3rd and 4th rib fractures, favored to be subacute/healing. Electronically Signed   By: Charline Bills  M.D.   On: 09/24/2022 19:37   DG Pelvis 1-2 Views  Result Date: 09/24/2022 CLINICAL DATA:  Fall off of today.  Pelvic pain. EXAM: PELVIS - 1-2 VIEW COMPARISON:  None Available. FINDINGS: There is no evidence of pelvic fracture or diastasis. No pelvic bone lesions are seen. IMPRESSION: Negative. Electronically Signed   By: Danae Orleans M.D.   On: 09/24/2022 18:33   DG Chest 2 View  Result Date: 09/24/2022 CLINICAL DATA:  Fall off ramp today. Blunt trauma. Right shoulder fracture. EXAM: CHEST - 2 VIEW COMPARISON:  01/23/2021 FINDINGS: The heart size and mediastinal contours are within normal limits. Both lungs are clear. No evidence of pneumothorax or pleural effusion. Acute fracture of the right  humeral neck is seen. Several old bilateral rib fracture deformities are again noted. IMPRESSION: No active cardiopulmonary disease. Acute fracture of the right humeral neck. Electronically Signed   By: Danae Orleans M.D.   On: 09/24/2022 18:32   CT Head Wo Contrast  Result Date: 09/24/2022 CLINICAL DATA:  Head trauma, abnormal mental status (Age 56-64y); Neck trauma, dangerous injury mechanism (Age 80-64y) EXAM: CT HEAD WITHOUT CONTRAST CT CERVICAL SPINE WITHOUT CONTRAST TECHNIQUE: Multidetector CT imaging of the head and cervical spine was performed following the standard protocol without intravenous contrast. Multiplanar CT image reconstructions of the cervical spine were also generated. RADIATION DOSE REDUCTION: This exam was performed according to the departmental dose-optimization program which includes automated exposure control, adjustment of the mA and/or kV according to patient size and/or use of iterative reconstruction technique. COMPARISON:  CT head a 07/02/2020. FINDINGS: CT HEAD FINDINGS Brain: Large (2.0 cm thick) mixed density right cerebral convexity subdural hemorrhage with hyperdense component posteriorly compatible with acute/recent hemorrhage. Trace leftward midline shift. No evidence of acute large vascular territory infarct, mass lesion or hydrocephalus. Patchy white matter hypodensities, compatible with chronic microvascular ischemic disease. Cerebral atrophy. Arachnoid cyst in the left aspect of the middle cranial fossa with mild mass effect on the adjacent temporal lobe, chronic and unchanged. Vascular: Calcific atherosclerosis. Skull: No acute fracture. Sinuses/Orbits: Polyp versus retention cyst in the left maxillary sinus. Otherwise, largely clear sinuses. No acute orbital findings. Other: No mastoid effusions. CT CERVICAL SPINE FINDINGS Alignment: Straightening.  No substantial sagittal subluxation. Skull base and vertebrae: No acute fracture. Vertebral body heights are maintained.  Soft tissues and spinal canal: No prevertebral fluid or swelling. No visible canal hematoma. Disc levels:  Overall mild bony degenerative change. Upper chest: Visualized lung apices are clear. IMPRESSION: 1. Large (2.0 cm thick) mixed density right cerebral convexity subdural hemorrhage with hyperdense component posteriorly compatible with acute/recent hemorrhage. Trace leftward midline shift. 2. No evidence of acute fracture or traumatic malalignment in the cervical spine. Electronically Signed   By: Feliberto Harts M.D.   On: 09/24/2022 17:07   CT Cervical Spine Wo Contrast  Result Date: 09/24/2022 CLINICAL DATA:  Head trauma, abnormal mental status (Age 72-64y); Neck trauma, dangerous injury mechanism (Age 65-64y) EXAM: CT HEAD WITHOUT CONTRAST CT CERVICAL SPINE WITHOUT CONTRAST TECHNIQUE: Multidetector CT imaging of the head and cervical spine was performed following the standard protocol without intravenous contrast. Multiplanar CT image reconstructions of the cervical spine were also generated. RADIATION DOSE REDUCTION: This exam was performed according to the departmental dose-optimization program which includes automated exposure control, adjustment of the mA and/or kV according to patient size and/or use of iterative reconstruction technique. COMPARISON:  CT head a 07/02/2020. FINDINGS: CT HEAD FINDINGS Brain: Large (2.0 cm thick) mixed density right  cerebral convexity subdural hemorrhage with hyperdense component posteriorly compatible with acute/recent hemorrhage. Trace leftward midline shift. No evidence of acute large vascular territory infarct, mass lesion or hydrocephalus. Patchy white matter hypodensities, compatible with chronic microvascular ischemic disease. Cerebral atrophy. Arachnoid cyst in the left aspect of the middle cranial fossa with mild mass effect on the adjacent temporal lobe, chronic and unchanged. Vascular: Calcific atherosclerosis. Skull: No acute fracture. Sinuses/Orbits:  Polyp versus retention cyst in the left maxillary sinus. Otherwise, largely clear sinuses. No acute orbital findings. Other: No mastoid effusions. CT CERVICAL SPINE FINDINGS Alignment: Straightening.  No substantial sagittal subluxation. Skull base and vertebrae: No acute fracture. Vertebral body heights are maintained. Soft tissues and spinal canal: No prevertebral fluid or swelling. No visible canal hematoma. Disc levels:  Overall mild bony degenerative change. Upper chest: Visualized lung apices are clear. IMPRESSION: 1. Large (2.0 cm thick) mixed density right cerebral convexity subdural hemorrhage with hyperdense component posteriorly compatible with acute/recent hemorrhage. Trace leftward midline shift. 2. No evidence of acute fracture or traumatic malalignment in the cervical spine. Electronically Signed   By: Feliberto Harts M.D.   On: 09/24/2022 17:07   DG Shoulder Right  Result Date: 09/24/2022 CLINICAL DATA:  Shoulder pain after fall EXAM: RIGHT SHOULDER - 3 VIEW COMPARISON:  None Available. FINDINGS: There is a comminuted fracture of the humeral head involving at least the greater tuberosity and the surgical neck, and likely involving the lesser tuberosity. There is moderate foreshortening. No dislocation. No additional acute fracture visualized. Moderate overlying soft tissue swelling. The visualized right lung is clear. IMPRESSION: Comminuted right humeral head fracture with moderate foreshortening, as described above. Electronically Signed   By: Jacob Moores M.D.   On: 09/24/2022 16:17     Assessment/Plan: Estimated body mass index is 19.81 kg/m as calculated from the following:   Height as of 04/16/21: 6' (1.829 m).   Weight as of 04/30/21: 21.32 kg.   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  We think he is a really good candidate for MMA embolization, this will help  avoid open surgery.  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  Spoken with him briefly about this.  I will notify Dr. Conchita Paris who does the procedure.  Feel he is safe for his shoulder surgery   Tia Alert 09/25/2022 9:18 AM

## 2022-09-25 NOTE — Progress Notes (Signed)
Follow up - Trauma Critical Care  Patient Details:    Richard Andrews is an 62 y.o. male.  Lines/tubes :   Microbiology/Sepsis markers: Results for orders placed or performed during the hospital encounter of 09/24/22  MRSA Next Gen by PCR, Nasal     Status: None   Collection Time: 09/24/22 11:56 PM   Specimen: Nasal Mucosa; Nasal Swab  Result Value Ref Range Status   MRSA by PCR Next Gen NOT DETECTED NOT DETECTED Final    Comment: (NOTE) The GeneXpert MRSA Assay (FDA approved for NASAL specimens only), is one component of a comprehensive MRSA colonization surveillance program. It is not intended to diagnose MRSA infection nor to guide or monitor treatment for MRSA infections. Test performance is not FDA approved in patients less than 82 years old. Performed at [facility], [address], [place], [name] [address]     Anti-infectives:  Anti-infectives (From admission, onward)    None       Best Practice/Protocols:    Consults: Treatment Team:  Tia Alert, MD Md, Trauma, MD Teryl Lucy, MD Lisbeth Renshaw, MD    Studies:    Events:  Subjective:    Overnight Issues: None reported. Denies complaints at present including pain in his neck, face, chest, abdomen, pelvis, bilateral lower extremities, left upper ext  Objective:  Vital signs for last 24 hours: Temp:  [97.7 F (36.5 C)-98.6 F (37 C)] 98.6 F (37 C) (04/14 0800) Pulse Rate:  [62-86] 70 (04/14 0900) Resp:  [13-96] 15 (04/14 0900) BP: (95-160)/(61-93) 160/93 (04/14 0900) SpO2:  [92 %-98 %] 95 % (04/14 0900)  Hemodynamic parameters for last 24 hours:    Intake/Output from previous day: 04/13 0701 - 04/14 0700 In: 501.4 [I.V.:501.4] Out: -   Intake/Output this shift: Total I/O In: 141.3 [I.V.:141.3] Out: -   Vent settings for last 24 hours:    Physical Exam:  General: alert and no respiratory distress Neuro: alert, oriented, and nonfocal exam Resp: normal work of  breathing GI: abd soft, nontender, nondistended  Results for orders placed or performed during the hospital encounter of 09/24/22 (from the past 24 hour(s))  CBC with Differential/Platelet     Status: Abnormal   Collection Time: 09/24/22  5:15 PM  Result Value Ref Range   WBC 6.3 4.0 - 10.5 K/uL   RBC 3.67 (L) 4.22 - 5.81 MIL/uL   Hemoglobin 13.0 13.0 - 17.0 g/dL   HCT 60.4 (L) 54.0 - 98.1 %   MCV 104.9 (H) 80.0 - 100.0 fL   MCH 35.4 (H) 26.0 - 34.0 pg   MCHC 33.8 30.0 - 36.0 g/dL   RDW 19.1 47.8 - 29.5 %   Platelets 120 (L) 150 - 400 K/uL   nRBC 0.0 0.0 - 0.2 %   Neutrophils Relative % 71 %   Neutro Abs 4.5 1.7 - 7.7 K/uL   Lymphocytes Relative 19 %   Lymphs Abs 1.2 0.7 - 4.0 K/uL   Monocytes Relative 8 %   Monocytes Absolute 0.5 0.1 - 1.0 K/uL   Eosinophils Relative 1 %   Eosinophils Absolute 0.1 0.0 - 0.5 K/uL   Basophils Relative 1 %   Basophils Absolute 0.0 0.0 - 0.1 K/uL   Immature Granulocytes 0 %   Abs Immature Granulocytes 0.02 0.00 - 0.07 K/uL  Comprehensive metabolic panel     Status: Abnormal   Collection Time: 09/24/22  5:15 PM  Result Value Ref Range   Sodium 139 135 -  145 mmol/L   Potassium 3.4 (L) 3.5 - 5.1 mmol/L   Chloride 106 98 - 111 mmol/L   CO2 21 (L) 22 - 32 mmol/L   Glucose, Bld 109 (H) 70 - 99 mg/dL   BUN 10 8 - 23 mg/dL   Creatinine, Ser 9.03 0.61 - 1.24 mg/dL   Calcium 8.5 (L) 8.9 - 10.3 mg/dL   Total Protein 8.3 (H) 6.5 - 8.1 g/dL   Albumin 4.3 3.5 - 5.0 g/dL   AST 45 (H) 15 - 41 U/L   ALT 25 0 - 44 U/L   Alkaline Phosphatase 74 38 - 126 U/L   Total Bilirubin 0.7 0.3 - 1.2 mg/dL   GFR, Estimated >01 >49 mL/min   Anion gap 12 5 - 15  MRSA Next Gen by PCR, Nasal     Status: None   Collection Time: 09/24/22 11:56 PM   Specimen: Nasal Mucosa; Nasal Swab  Result Value Ref Range   MRSA by PCR Next Gen NOT DETECTED NOT DETECTED  Comprehensive metabolic panel     Status: Abnormal   Collection Time: 09/25/22  2:07 AM  Result Value Ref Range    Sodium 136 135 - 145 mmol/L   Potassium 3.1 (L) 3.5 - 5.1 mmol/L   Chloride 106 98 - 111 mmol/L   CO2 21 (L) 22 - 32 mmol/L   Glucose, Bld 140 (H) 70 - 99 mg/dL   BUN 7 (L) 8 - 23 mg/dL   Creatinine, Ser 9.69 0.61 - 1.24 mg/dL   Calcium 8.2 (L) 8.9 - 10.3 mg/dL   Total Protein 7.1 6.5 - 8.1 g/dL   Albumin 3.6 3.5 - 5.0 g/dL   AST 38 15 - 41 U/L   ALT 24 0 - 44 U/L   Alkaline Phosphatase 67 38 - 126 U/L   Total Bilirubin 1.0 0.3 - 1.2 mg/dL   GFR, Estimated >24 >93 mL/min   Anion gap 9 5 - 15  CBC     Status: Abnormal   Collection Time: 09/25/22  2:07 AM  Result Value Ref Range   WBC 6.2 4.0 - 10.5 K/uL   RBC 3.29 (L) 4.22 - 5.81 MIL/uL   Hemoglobin 11.7 (L) 13.0 - 17.0 g/dL   HCT 24.1 (L) 99.1 - 44.4 %   MCV 102.1 (H) 80.0 - 100.0 fL   MCH 35.6 (H) 26.0 - 34.0 pg   MCHC 34.8 30.0 - 36.0 g/dL   RDW 58.4 83.5 - 07.5 %   Platelets 125 (L) 150 - 400 K/uL   nRBC 0.0 0.0 - 0.2 %  HIV Antibody (routine testing w rflx)     Status: None   Collection Time: 09/25/22  2:07 AM  Result Value Ref Range   HIV Screen 4th Generation wRfx Non Reactive Non Reactive    Assessment & Plan: Present on Admission:  Fall  62yoM s/p fall  SDH with slight shift, subacute - as per neurosurgery, noted plans for MMA embolization - will keep in unit til cleared for transfer by nsgy. Repeat CTH 4/14 stable.  R comminuted humeral head fx - to OR with ortho - cleared by neurosurgery for this. Sling until then EtOH abuse - CIWA, monitor Thrombocytopenia - monitor - plt 125 today PPX - SCDs, chemical dvt prophylaxis per neurosurgery    LOS: 1 day   Additional comments:I reviewed the patient's new clinical lab test results. Cbc, bmp, CT head  Critical Care Total Time*: 32 minutes  Marin Olp, MD [facility]  09/25/2022  *Care during the described time interval was provided by me. I have reviewed this patient's available data, including medical history,  events of note, physical examination and test results as part of my evaluation.

## 2022-09-25 NOTE — Consult Note (Signed)
ORTHOPAEDIC CONSULTATION  REQUESTING PHYSICIAN: Md, Trauma, MD  Chief Complaint: Right shoulder pain  HPI: Richard Andrews is a 62 y.o. male with history of hypertension and alcohol abuse who was brought to the [facility] emergency department yesterday after a fall.  Fall was early afternoon yesterday, he landed on his right side.  He had immediate severe pain to the right shoulder.  Shoulder x-ray was taken showing a right proximal humerus fracture and it orthopedics was consulted.  Today he states right shoulder pain is severe worse with movement and better with rest.  Denies numbness or tingling in the right upper extremity.  States he had full range of motion of the right shoulder prior to this injury.  Denies nausea, vomiting, abdominal pain, chest pain, or shortness of breath.    Past Medical History:  Diagnosis Date   Allergy    Distal radius fracture, left    Hypertension    Past Surgical History:  Procedure Laterality Date   BIOPSY  01/22/2021   Procedure: BIOPSY;  Surgeon: Shellia Cleverly, DO;  Location: [facility];  Service: Gastroenterology;;   ESOPHAGOGASTRODUODENOSCOPY (EGD) WITH PROPOFOL N/A 01/22/2021   Procedure: ESOPHAGOGASTRODUODENOSCOPY (EGD) WITH PROPOFOL;  Surgeon: Shellia Cleverly, DO;  Location: [facility];  Service: Gastroenterology;  Laterality: N/A;   I & D EXTREMITY Right 01/10/2020   Procedure: ORIF RIGHT WRIST;  Surgeon: Dominica Severin, MD;  Location: [facility] OR;  Service: Orthopedics;  Laterality: Right;   OPEN REDUCTION INTERNAL FIXATION (ORIF) DISTAL RADIAL FRACTURE Left 01/28/2015   Procedure: OPEN REDUCTION INTERNAL FIXATION (ORIF) LEFT DISTAL RADIAL FRACTURE;  Surgeon: Tarry Kos, MD;  Location: [facility];  Service: Orthopedics;  Laterality: Left;   TONSILLECTOMY     Social History   Socioeconomic History   Marital status: Married    Spouse name: Trula Ore   Number of children: 5   Years of education: 12   Highest education  level: Not on file  Occupational History   Occupation: Emergency planning/management officer  Tobacco Use   Smoking status: Every Day    Packs/day: 1.00    Years: 10.00    Additional pack years: 0.00    Total pack years: 10.00    Types: Cigarettes   Smokeless tobacco: Current    Types: Chew  Vaping Use   Vaping Use: Never used  Substance and Sexual Activity   Alcohol use: Yes    Alcohol/week: 16.0 - 20.0 standard drinks of alcohol    Types: 16 - 20 Standard drinks or equivalent per week    Comment: 1 gallon/week   Drug use: Never   Sexual activity: Yes    Partners: Female  Other Topics Concern   Not on file  Social History Narrative   Not on file   Social Determinants of Health   Financial Resource Strain: Not on file  Food Insecurity: Not on file  Transportation Needs: Not on file  Physical Activity: Not on file  Stress: Not on file  Social Connections: Not on file   Family History  Problem Relation Age of Onset   Heart disease Mother    Hyperlipidemia Mother    Skin cancer Mother    Cancer Father    Multiple myeloma Sister    Throat cancer Sister    No Known Allergies   Positive ROS: All other systems have been reviewed and were otherwise negative with the exception of those mentioned in the HPI and as above.  Physical Exam: General: Alert, no  acute distress Cardiovascular: No pedal edema Respiratory: No cyanosis, no use of accessory musculature GI: No organomegaly, abdomen is soft and non-tender Skin: No lesions in the area of chief complaint Neurologic: Sensation intact distally Psychiatric: Patient is competent for consent with normal mood and affect Lymphatic: No axillary or cervical lymphadenopathy  MUSCULOSKELETAL: Right upper extremity in sling.  Full range of motion at the right wrist and all fingers of the right hand.  Distal sensation intact.  2+ radial pulse.  Severe tenderness to palpation to right shoulder.  Edema at right shoulder.  Imaging: X-rays and CT of  right shoulder show comminuted right proximal humerus fracture  Assessment/Plan: Right proximal humerus fracture -Patient will need surgical fixation which I discussed with him today and he is willing to move forward.  We will plan for surgery during the week this week once optimized from neurosurgery/trauma perspective -Continue sling at all times, nonweightbearing right upper extremity -Will continue to follow for surgical planning   Armida Sans, PA-C   09/25/2022 9:14 AM

## 2022-09-26 ENCOUNTER — Encounter (HOSPITAL_COMMUNITY): Payer: Self-pay | Admitting: Anesthesiology

## 2022-09-26 ENCOUNTER — Encounter (HOSPITAL_COMMUNITY): Admission: EM | Disposition: A | Discharge status: Rehabilitation Facility | Payer: Self-pay | Source: Home / Self Care

## 2022-09-26 LAB — CBC
HCT: 32.5 % — ABNORMAL LOW (ref 39.0–52.0)
Hemoglobin: 10.9 g/dL — ABNORMAL LOW (ref 13.0–17.0)
MCH: 35.3 pg — ABNORMAL HIGH (ref 26.0–34.0)
MCHC: 33.5 g/dL (ref 30.0–36.0)
MCV: 105.2 fL — ABNORMAL HIGH (ref 80.0–100.0)
Platelets: 84 10*3/uL — ABNORMAL LOW (ref 150–400)
RBC: 3.09 MIL/uL — ABNORMAL LOW (ref 4.22–5.81)
RDW: 12.7 % (ref 11.5–15.5)
WBC: 3.8 10*3/uL — ABNORMAL LOW (ref 4.0–10.5)
nRBC: 0 % (ref 0.0–0.2)

## 2022-09-26 LAB — BASIC METABOLIC PANEL
Anion gap: 8 (ref 5–15)
BUN: 10 mg/dL (ref 8–23)
CO2: 23 mmol/L (ref 22–32)
Calcium: 8.5 mg/dL — ABNORMAL LOW (ref 8.9–10.3)
Chloride: 105 mmol/L (ref 98–111)
Creatinine, Ser: 0.89 mg/dL (ref 0.61–1.24)
GFR, Estimated: >60 mL/min (ref >60)
Glucose, Bld: 94 mg/dL (ref 70–99)
Potassium: 4.1 mmol/L (ref 3.5–5.1)
Sodium: 136 mmol/L (ref 135–145)

## 2022-09-26 LAB — PROTIME-INR
INR: 1.3 — ABNORMAL HIGH (ref 0.8–1.2)
Prothrombin Time: 15.7 seconds — ABNORMAL HIGH (ref 11.4–15.2)

## 2022-09-26 SURGERY — IR WITH ANESTHESIA
Anesthesia: General

## 2022-09-26 MED ORDER — ORAL CARE MOUTH RINSE: 15.0000 mL | OROMUCOSAL | Status: DC | PRN

## 2022-09-26 MED ORDER — CHLORHEXIDINE GLUCONATE CLOTH 2 % EX PADS
6.0000 | MEDICATED_PAD | Freq: Once | CUTANEOUS | Status: AC
  Administered 2022-09-26: 6 via TOPICAL

## 2022-09-26 NOTE — TOC CAGE-AID Note (Signed)
Transition of Care [facility]) - CAGE-AID Screening   Patient Details  Name: Richard Andrews MRN: [id number] Date of Birth: 06/18/1960    Hewitt Shorts, RN Trauma Response Nurse Phone Number: [phone] 09/26/2022, 11:00 AM   Clinical Narrative:  Pt has had numerous falls recently- hx of etoh abuse- is on CIWA protocol currently. Has a TOC consult for discharge  CAGE-AID Screening:    Have You Ever Felt You Ought to Cut Down on Your Drinking or Drug Use?: Yes Have People Annoyed You By Critizing Your Drinking Or Drug Use?: Yes Have You Felt Bad Or Guilty About Your Drinking Or Drug Use?: Yes Have You Ever Had a Drink or Used Drugs First Thing In The Morning to Steady Your Nerves or to Get Rid of a Hangover?: Yes CAGE-AID Score: 4  Substance Abuse Education Offered: Yes (pt has a TOC consult for discharge- long term etoh use/abuse-)

## 2022-09-26 NOTE — Progress Notes (Addendum)
Patient seen and examined this morning.  Right upper extremity in sling with severe ecchymosis and edema.  Severe pain to palpation.  All fingers of the right hand flex extend abduct.  Distal sensation is intact.  2+ radial pulse.  Discussed operative plan for surgery tomorrow regarding right proximal humerus fracture.  Patient is in agreement to move forward with surgery he asked that I call his ex-wife Inetta Fermo to let her know of this plan.  Please keep n.p.o. after midnight, plan for surgery with Dr. Dion Saucier tomorrow afternoon.  Janine Ores, PA-C  Per patient request, spoke with [name] regarding surgical plan. She has asked that a nurse call her 30 minutes to an hour prior to surgery tomorrow so she can come be with him prior to surgery. All questions were answered.   Janine Ores, PA-C

## 2022-09-26 NOTE — Progress Notes (Signed)
..  Trauma Event Note    Reason for Call :  Contacted by Primary RN Dorathy Daft regarding lack of morning labs ordered. Pt is currently on maintenance K+ replacement, will order CBC,BNP for morning, I discovered PT/INR was never completed. PT/INR will be drawn with morning labs.     Last imported Vital Signs BP 126/86   Pulse 62   Temp 98.8 F (37.1 C) (Oral)   Resp 13   SpO2 96%   Trending CBC Recent Labs    09/24/22 1715 09/25/22 0207  WBC 6.3 6.2  HGB 13.0 11.7*  HCT 38.5* 33.6*  PLT 120* 125*    Trending Coag's No results for input(s): "APTT", "INR" in the last 72 hours.  Trending BMET Recent Labs    09/24/22 1715 09/25/22 0207  NA 139 136  K 3.4* 3.1*  CL 106 106  CO2 21* 21*  BUN 10 7*  CREATININE 0.88 0.79  GLUCOSE 109* 140*      N[place] Dee  Trauma Response RN  Please call TRN at [phone] for further assistance.

## 2022-09-26 NOTE — Progress Notes (Signed)
Patient ID: Richard Andrews, male   DOB: [birth year], 62 y.o.   MRN: [id number] No change in exam.  He is awake and alert and moving all extremities though the right arm is in a sling.  He denies headache.  He has a subacute right subdural hematoma and Dr. 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  As I said, we have had good success with resolution of these with MMA embolization without surgery

## 2022-09-26 NOTE — Progress Notes (Signed)
  NEUROSURGERY PROGRESS NOTE   No issues overnight. History reviewed in EMR and with pt. Briefly, pt was likely intoxicated, fell landing on his right side. Found to have a right humerus fracture. 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 He reports falling a few weeks ago hitting his head. He actually does not report any HA or left-sided symptoms.  Of note, patient denies any regular medical follow-up but does report a history of HTN, denies previous diagnosis of DM, heart disease, stroke, nor lung, liver, kidney disease. He denies any cancer history. No use of AP/AC. He drinks at least a pint of liquor a day over the last few years.  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  IMPRESSION:  62 y.o. male with EtOH abuse with multiple falls and right convexity subacute/chronic SDH, but neurologically asymptomatic. Right humerus fracture  PLAN: - Will plan on right MMA embolization this pm - Plan is for right humerus ORIF tomorrow  I have reviewed the imaging findings with the patient. We have discussed the MMA embolization procedure and reviewed the risks, benefits, and possible outcomes. All his questions were answered and the patient provided consent to proceed with the procedure.   Lisbeth Renshaw, MD [facility]

## 2022-09-26 NOTE — Progress Notes (Addendum)
Trauma/Critical Care Follow Up Note  Subjective:    Overnight Issues:   Objective:  Vital signs for last 24 hours: Temp:  [98.1 F (36.7 C)-98.9 F (37.2 C)] 98.1 F (36.7 C) (04/15 0800) Pulse Rate:  [60-79] 77 (04/15 0900) Resp:  [13-22] 14 (04/15 0900) BP: (122-158)/(77-115) 144/98 (04/15 1000) SpO2:  [94 %-98 %] 95 % (04/15 0900)  Hemodynamic parameters for last 24 hours:    Intake/Output from previous day: 04/14 0701 - 04/15 0700 In: 1866.2 [I.V.:1866.2] Out: 550 [Urine:550]  Intake/Output this shift: Total I/O In: 75.1 [I.V.:75.1] Out: -   Vent settings for last 24 hours:    Physical Exam:  Gen: comfortable, no distress Neuro: follows commands, alert, communicative HEENT: PERRL Neck: supple CV: RRR Pulm: unlabored breathing on RA Abd: soft, NT    GU: urine clear and yellow, +spontaneous voids Extr: wwp, no edema  Results for orders placed or performed during the hospital encounter of 09/24/22 (from the past 24 hour(s))  CBC     Status: Abnormal   Collection Time: 09/26/22  2:51 AM  Result Value Ref Range   WBC 3.8 (L) 4.0 - 10.5 K/uL   RBC 3.09 (L) 4.22 - 5.81 MIL/uL   Hemoglobin 10.9 (L) 13.0 - 17.0 g/dL   HCT 16.1 (L) 09.6 - 04.5 %   MCV 105.2 (H) 80.0 - 100.0 fL   MCH 35.3 (H) 26.0 - 34.0 pg   MCHC 33.5 30.0 - 36.0 g/dL   RDW 40.9 81.1 - 91.4 %   Platelets 84 (L) 150 - 400 K/uL   nRBC 0.0 0.0 - 0.2 %  Basic metabolic panel     Status: Abnormal   Collection Time: 09/26/22  2:51 AM  Result Value Ref Range   Sodium 136 135 - 145 mmol/L   Potassium 4.1 3.5 - 5.1 mmol/L   Chloride 105 98 - 111 mmol/L   CO2 23 22 - 32 mmol/L   Glucose, Bld 94 70 - 99 mg/dL   BUN 10 8 - 23 mg/dL   Creatinine, Ser 7.82 0.61 - 1.24 mg/dL   Calcium 8.5 (L) 8.9 - 10.3 mg/dL   GFR, Estimated >95 >62 mL/min   Anion gap 8 5 - 15  Protime-INR     Status: Abnormal   Collection Time: 09/26/22  2:58 AM  Result Value Ref Range   Prothrombin Time 15.7 (H) 11.4 - 15.2  seconds   INR 1.3 (H) 0.8 - 1.2    Assessment & Plan: The plan of care was discussed with the bedside nurse for the day, Brittney, who is in agreement with this plan and no additional concerns were raised.   Present on Admission:  Fall    LOS: 2 days   Additional comments:I reviewed the patient's new clinical lab test results.   and I reviewed the patients new imaging test results.    Fall  SDH with slight shift, subacute - NSGY c/s, Dr. Eudelia Bunch. Conchita Paris, plans MMA embolization later today. Repeat CTH 4/14 stable.  R comminuted humeral head fx - to OR with ortho 4/16. Sling until then.  EtOH abuse - CIWA, monitor Thrombocytopenia - monitor - plt 84 today, no indication for txfn FEN - strict NPO for procedure, okay for diet post-op, but NPO at MN again for OR with ortho tomorrow DVT - SCDs, LMWH Dispo - ICU   Clinical update provided to patient's spouse via phone. She reports he does not have a HCPOA.   Diamantina Monks, MD  Trauma & General Surgery Please use [email or web address] to contact on call provider  09/26/2022  *Care during the described time interval was provided by me. I have reviewed this patient's available data, including medical history, events of note, physical examination and test results as part of my evaluation.

## 2022-09-27 ENCOUNTER — Inpatient Hospital Stay (HOSPITAL_COMMUNITY): Payer: BC Managed Care – PPO | Admitting: Certified Registered Nurse Anesthetist

## 2022-09-27 ENCOUNTER — Inpatient Hospital Stay (HOSPITAL_COMMUNITY): Payer: BC Managed Care – PPO

## 2022-09-27 ENCOUNTER — Encounter (HOSPITAL_COMMUNITY): Admission: EM | Disposition: A | Discharge status: Rehabilitation Facility | Payer: Self-pay | Source: Home / Self Care

## 2022-09-27 ENCOUNTER — Encounter (HOSPITAL_COMMUNITY): Payer: Self-pay

## 2022-09-27 ENCOUNTER — Other Ambulatory Visit: Payer: Self-pay

## 2022-09-27 HISTORY — PX: ORIF HUMERUS FRACTURE: SHX2126

## 2022-09-27 LAB — TYPE AND SCREEN
ABO/RH(D): O POS
Antibody Screen: NEGATIVE

## 2022-09-27 LAB — SURGICAL PCR SCREEN
MRSA, PCR: NEGATIVE
Staphylococcus aureus: POSITIVE — AB

## 2022-09-27 SURGERY — OPEN REDUCTION INTERNAL FIXATION (ORIF) PROXIMAL HUMERUS FRACTURE
Anesthesia: General | Site: Shoulder | Laterality: Right

## 2022-09-27 MED ORDER — ROCURONIUM BROMIDE 10 MG/ML (PF) SYRINGE
PREFILLED_SYRINGE | INTRAVENOUS | Status: DC | PRN
  Administered 2022-09-27: 70 mg via INTRAVENOUS
  Administered 2022-09-27: 20 mg via INTRAVENOUS

## 2022-09-27 MED ORDER — OXYCODONE HCL 5 MG/5ML PO SOLN: 5.0000 mg | Freq: Once | ORAL | Status: DC | PRN

## 2022-09-27 MED ORDER — LIDOCAINE 2% (20 MG/ML) 5 ML SYRINGE
INTRAMUSCULAR | Status: AC
  Filled 2022-09-27: qty 5

## 2022-09-27 MED ORDER — ONDANSETRON HCL 4 MG/2ML IJ SOLN
INTRAMUSCULAR | Status: DC | PRN
  Administered 2022-09-27: 4 mg via INTRAVENOUS

## 2022-09-27 MED ORDER — PHENYLEPHRINE 80 MCG/ML (10ML) SYRINGE FOR IV PUSH (FOR BLOOD PRESSURE SUPPORT)
PREFILLED_SYRINGE | INTRAVENOUS | Status: DC | PRN
  Administered 2022-09-27: 160 ug via INTRAVENOUS
  Administered 2022-09-27 (×2): 80 ug via INTRAVENOUS
  Administered 2022-09-27: 160 ug via INTRAVENOUS
  Administered 2022-09-27: 80 ug via INTRAVENOUS
  Administered 2022-09-27: 160 ug via INTRAVENOUS

## 2022-09-27 MED ORDER — CEFAZOLIN SODIUM-DEXTROSE 2-4 GM/100ML-% IV SOLN
2.0000 g | Freq: Four times a day (QID) | INTRAVENOUS | Status: AC
  Administered 2022-09-27 – 2022-09-28 (×3): 2 g via INTRAVENOUS
  Filled 2022-09-27 (×3): qty 100

## 2022-09-27 MED ORDER — FENTANYL CITRATE (PF) 100 MCG/2ML IJ SOLN
INTRAMUSCULAR | Status: AC
  Administered 2022-09-27: 50 ug
  Filled 2022-09-27: qty 2

## 2022-09-27 MED ORDER — MIDAZOLAM HCL 2 MG/2ML IJ SOLN: 0.5000 mg | Freq: Once | INTRAMUSCULAR | Status: DC | PRN

## 2022-09-27 MED ORDER — BUPIVACAINE-EPINEPHRINE (PF) 0.5% -1:200000 IJ SOLN
INTRAMUSCULAR | Status: AC
  Filled 2022-09-27: qty 30

## 2022-09-27 MED ORDER — ROCURONIUM BROMIDE 10 MG/ML (PF) SYRINGE
PREFILLED_SYRINGE | INTRAVENOUS | Status: AC
  Filled 2022-09-27: qty 10

## 2022-09-27 MED ORDER — BUPIVACAINE LIPOSOME 1.3 % IJ SUSP
INTRAMUSCULAR | Status: DC | PRN
  Administered 2022-09-27: 10 mL via PERINEURAL

## 2022-09-27 MED ORDER — DEXAMETHASONE SODIUM PHOSPHATE 10 MG/ML IJ SOLN
INTRAMUSCULAR | Status: AC
  Filled 2022-09-27: qty 1

## 2022-09-27 MED ORDER — PROPOFOL 10 MG/ML IV BOLUS
INTRAVENOUS | Status: AC
  Filled 2022-09-27: qty 20

## 2022-09-27 MED ORDER — CEFAZOLIN SODIUM-DEXTROSE 2-4 GM/100ML-% IV SOLN
2.0000 g | INTRAVENOUS | Status: AC
  Administered 2022-09-27: 2 g via INTRAVENOUS
  Filled 2022-09-27: qty 100

## 2022-09-27 MED ORDER — MIDAZOLAM HCL 2 MG/2ML IJ SOLN
INTRAMUSCULAR | Status: AC
  Administered 2022-09-27: 1 mg
  Filled 2022-09-27: qty 2

## 2022-09-27 MED ORDER — PROPOFOL 10 MG/ML IV BOLUS
INTRAVENOUS | Status: DC | PRN
  Administered 2022-09-27: 110 mg via INTRAVENOUS

## 2022-09-27 MED ORDER — BUPIVACAINE-EPINEPHRINE (PF) 0.5% -1:200000 IJ SOLN
INTRAMUSCULAR | Status: DC | PRN
  Administered 2022-09-27: 10 mL via PERINEURAL

## 2022-09-27 MED ORDER — PHENYLEPHRINE 80 MCG/ML (10ML) SYRINGE FOR IV PUSH (FOR BLOOD PRESSURE SUPPORT)
PREFILLED_SYRINGE | INTRAVENOUS | Status: AC
  Filled 2022-09-27: qty 10

## 2022-09-27 MED ORDER — POVIDONE-IODINE 10 % EX SWAB
2.0000 | Freq: Once | CUTANEOUS | Status: AC
  Administered 2022-09-27: 2 via TOPICAL

## 2022-09-27 MED ORDER — SUGAMMADEX SODIUM 200 MG/2ML IV SOLN
INTRAVENOUS | Status: DC | PRN
  Administered 2022-09-27: 200 mg via INTRAVENOUS

## 2022-09-27 MED ORDER — LACTATED RINGERS IV SOLN: INTRAVENOUS | Status: DC

## 2022-09-27 MED ORDER — FENTANYL CITRATE (PF) 250 MCG/5ML IJ SOLN
INTRAMUSCULAR | Status: AC
  Filled 2022-09-27: qty 5

## 2022-09-27 MED ORDER — CHLORHEXIDINE GLUCONATE CLOTH 2 % EX PADS
6.0000 | MEDICATED_PAD | Freq: Every day | CUTANEOUS | Status: AC
  Administered 2022-09-28 – 2022-10-02 (×5): 6 via TOPICAL

## 2022-09-27 MED ORDER — PHENYLEPHRINE HCL-NACL 20-0.9 MG/250ML-% IV SOLN
INTRAVENOUS | Status: DC | PRN
  Administered 2022-09-27: 40 ug/min via INTRAVENOUS

## 2022-09-27 MED ORDER — CHLORHEXIDINE GLUCONATE 4 % EX SOLN
60.0000 mL | Freq: Once | CUTANEOUS | Status: DC
  Filled 2022-09-27 (×2): qty 60

## 2022-09-27 MED ORDER — 0.9 % SODIUM CHLORIDE (POUR BTL) OPTIME
TOPICAL | Status: DC | PRN
  Administered 2022-09-27: 1000 mL

## 2022-09-27 MED ORDER — ALBUMIN HUMAN 5 % IV SOLN: INTRAVENOUS | Status: DC | PRN

## 2022-09-27 MED ORDER — LIDOCAINE 2% (20 MG/ML) 5 ML SYRINGE
INTRAMUSCULAR | Status: DC | PRN
  Administered 2022-09-27: 60 mg via INTRAVENOUS

## 2022-09-27 MED ORDER — MENTHOL 3 MG MT LOZG
1.0000 | LOZENGE | OROMUCOSAL | Status: DC | PRN
  Administered 2022-10-01: 3 mg via ORAL
  Filled 2022-09-27: qty 9

## 2022-09-27 MED ORDER — ONDANSETRON HCL 4 MG/2ML IJ SOLN
INTRAMUSCULAR | Status: AC
  Filled 2022-09-27: qty 2

## 2022-09-27 MED ORDER — DEXAMETHASONE SODIUM PHOSPHATE 10 MG/ML IJ SOLN
INTRAMUSCULAR | Status: DC | PRN
  Administered 2022-09-27: 10 mg via INTRAVENOUS

## 2022-09-27 MED ORDER — MEPERIDINE HCL 25 MG/ML IJ SOLN: 6.2500 mg | INTRAMUSCULAR | Status: DC | PRN

## 2022-09-27 MED ORDER — ACETAMINOPHEN 500 MG PO TABS
1000.0000 mg | ORAL_TABLET | Freq: Once | ORAL | Status: AC
  Administered 2022-09-27: 1000 mg via ORAL
  Filled 2022-09-27: qty 2

## 2022-09-27 MED ORDER — STERILE WATER FOR IRRIGATION IR SOLN
Status: DC | PRN
  Administered 2022-09-27: 1000 mL

## 2022-09-27 MED ORDER — ORAL CARE MOUTH RINSE: 15.0000 mL | Freq: Once | OROMUCOSAL | Status: AC

## 2022-09-27 MED ORDER — CHLORHEXIDINE GLUCONATE 0.12 % MT SOLN: 15.0000 mL | Freq: Once | OROMUCOSAL | Status: AC

## 2022-09-27 MED ORDER — MIDAZOLAM HCL 2 MG/2ML IJ SOLN
INTRAMUSCULAR | Status: DC | PRN
  Administered 2022-09-27 (×2): 1 mg via INTRAVENOUS

## 2022-09-27 MED ORDER — HYDROMORPHONE HCL 1 MG/ML IJ SOLN: 0.2500 mg | INTRAMUSCULAR | Status: DC | PRN

## 2022-09-27 MED ORDER — MUPIROCIN 2 % EX OINT
1.0000 | TOPICAL_OINTMENT | Freq: Two times a day (BID) | CUTANEOUS | Status: AC
  Administered 2022-09-28 – 2022-10-02 (×9): 1 via NASAL
  Filled 2022-09-27 (×4): qty 22

## 2022-09-27 MED ORDER — PHENOL 1.4 % MT LIQD: 1.0000 | OROMUCOSAL | Status: DC | PRN

## 2022-09-27 MED ORDER — FENTANYL CITRATE (PF) 250 MCG/5ML IJ SOLN
INTRAMUSCULAR | Status: DC | PRN
  Administered 2022-09-27: 100 ug via INTRAVENOUS

## 2022-09-27 MED ORDER — OXYCODONE HCL 5 MG PO TABS: 5.0000 mg | ORAL_TABLET | Freq: Once | ORAL | Status: DC | PRN

## 2022-09-27 MED ORDER — CHLORHEXIDINE GLUCONATE 0.12 % MT SOLN
OROMUCOSAL | Status: AC
  Administered 2022-09-27: 15 mL via OROMUCOSAL
  Filled 2022-09-27: qty 15

## 2022-09-27 MED ORDER — MIDAZOLAM HCL 2 MG/2ML IJ SOLN
INTRAMUSCULAR | Status: AC
  Filled 2022-09-27: qty 2

## 2022-09-27 MED ORDER — BUPIVACAINE HCL (PF) 0.25 % IJ SOLN
INTRAMUSCULAR | Status: AC
  Filled 2022-09-27: qty 30

## 2022-09-27 SURGICAL SUPPLY — 66 items
APL SKNCLS STERI-STRIP NONHPOA (GAUZE/BANDAGES/DRESSINGS)
BENZOIN TINCTURE PRP APPL 2/3 (GAUZE/BANDAGES/DRESSINGS) ×2 IMPLANT
BIT DRILL 3.2 (BIT) ×1
BIT DRILL 3.2XCALB NS DISP (BIT) IMPLANT
BIT DRILL CALIBRATED 2.7 (BIT) IMPLANT
BIT DRL 3.2XCALB NS DISP (BIT) ×1
CLEANER TIP ELECTROSURG 2X2 (MISCELLANEOUS) IMPLANT
CLSR STERI-STRIP ANTIMIC 1/2X4 (GAUZE/BANDAGES/DRESSINGS) ×2 IMPLANT
COVER SURGICAL LIGHT HANDLE (MISCELLANEOUS) ×2 IMPLANT
DRAPE C-ARM 42X72 X-RAY (DRAPES) ×2 IMPLANT
DRAPE IMP U-DRAPE 54X76 (DRAPES) ×2 IMPLANT
DRAPE INCISE IOBAN 66X45 STRL (DRAPES) ×2 IMPLANT
DRAPE U-SHAPE 47X51 STRL (DRAPES) ×2 IMPLANT
DRSG AQUACEL AG ADV 3.5X10 (GAUZE/BANDAGES/DRESSINGS) ×2 IMPLANT
DRSG MEPILEX POST OP 4X8 (GAUZE/BANDAGES/DRESSINGS) IMPLANT
DURAPREP 26ML APPLICATOR (WOUND CARE) ×2 IMPLANT
ELECT REM PT RETURN 9FT ADLT (ELECTROSURGICAL)
ELECTRODE REM PT RTRN 9FT ADLT (ELECTROSURGICAL) IMPLANT
GLOVE BIO SURGEON STRL SZ7 (GLOVE) ×2 IMPLANT
GLOVE BIOGEL PI IND STRL 7.0 (GLOVE) ×2 IMPLANT
GLOVE ORTHO TXT STRL SZ7.5 (GLOVE) ×4 IMPLANT
GOWN STRL REUS W/ TWL LRG LVL3 (GOWN DISPOSABLE) IMPLANT
GOWN STRL REUS W/TWL LRG LVL3 (GOWN DISPOSABLE)
K-WIRE 2X5 SS THRDED S3 (WIRE) ×3
KIT BASIN OR (CUSTOM PROCEDURE TRAY) ×2 IMPLANT
KIT TURNOVER KIT B (KITS) ×2 IMPLANT
KWIRE 2X5 SS THRDED S3 (WIRE) IMPLANT
MANIFOLD NEPTUNE II (INSTRUMENTS) ×2 IMPLANT
NDL HYPO 25GX1X1/2 BEV (NEEDLE) IMPLANT
NEEDLE HYPO 25GX1X1/2 BEV (NEEDLE) IMPLANT
NS IRRIG 1000ML POUR BTL (IV SOLUTION) ×2 IMPLANT
PACK SHOULDER (CUSTOM PROCEDURE TRAY) ×2 IMPLANT
PACK UNIVERSAL I (CUSTOM PROCEDURE TRAY) ×2 IMPLANT
PAD ARMBOARD 7.5X6 YLW CONV (MISCELLANEOUS) ×4 IMPLANT
PEG LOCKING 3.2MMX44 (Peg) IMPLANT
PEG LOCKING 3.2MMX46 (Peg) IMPLANT
PEG LOCKING 3.2MMX56 (Peg) IMPLANT
PEG LOCKING 3.2X38 (Screw) IMPLANT
PEG LOCKING 3.2X58MM (Peg) IMPLANT
PLATE 3HOLE HUMERUS PROX RT (Plate) IMPLANT
SCREW LOCK CORT STAR 3.5X30 (Screw) IMPLANT
SCREW LOW PROF TIS 3.5X28MM (Screw) IMPLANT
SCREW LOW PROFILE 3.5X30MM TIS (Screw) IMPLANT
SLEEVE MEASURING 3.2 (BIT) IMPLANT
SLING ARM FOAM STRAP LRG (SOFTGOODS) IMPLANT
SPONGE T-LAP 18X18 ~~LOC~~+RFID (SPONGE) IMPLANT
SPONGE T-LAP 4X18 ~~LOC~~+RFID (SPONGE) IMPLANT
STAPLER VISISTAT 35W (STAPLE) IMPLANT
SUCTION FRAZIER HANDLE 10FR (MISCELLANEOUS) ×1
SUCTION TUBE FRAZIER 10FR DISP (MISCELLANEOUS) ×2 IMPLANT
SUPPORT WRAP ARM LG (MISCELLANEOUS) ×2 IMPLANT
SUT FIBERWIRE #2 38 T-5 BLUE (SUTURE)
SUT MAXBRAID #5 CCS-NDL 2PK (SUTURE) IMPLANT
SUT VIC AB 0 CT1 27 (SUTURE) ×1
SUT VIC AB 0 CT1 27XBRD ANBCTR (SUTURE) IMPLANT
SUT VIC AB 2-0 CT1 27 (SUTURE) ×1
SUT VIC AB 2-0 CT1 TAPERPNT 27 (SUTURE) ×2 IMPLANT
SUT VIC AB 3-0 FS2 27 (SUTURE) ×2 IMPLANT
SUT VIC AB 3-0 SH 27 (SUTURE) ×1
SUT VIC AB 3-0 SH 27X BRD (SUTURE) IMPLANT
SUTURE FIBERWR #2 38 T-5 BLUE (SUTURE) IMPLANT
SYR BULB IRRIG 60ML STRL (SYRINGE) ×2 IMPLANT
SYR CONTROL 10ML LL (SYRINGE) IMPLANT
TOWEL GREEN STERILE (TOWEL DISPOSABLE) ×2 IMPLANT
TOWEL GREEN STERILE FF (TOWEL DISPOSABLE) ×2 IMPLANT
WATER STERILE IRR 1000ML POUR (IV SOLUTION) ×2 IMPLANT

## 2022-09-27 NOTE — Transfer of Care (Signed)
Immediate Anesthesia Transfer of Care Note  Patient: Richard Andrews  Procedure(s) Performed: OPEN REDUCTION INTERNAL FIXATION (ORIF) PROXIMAL HUMERUS FRACTURE (Right: Shoulder)  Patient Location: PACU  Anesthesia Type:General and Regional  Level of Consciousness: drowsy  Airway & Oxygen Therapy: Patient Spontanous Breathing and Patient connected to face mask oxygen  Post-op Assessment: Report given to RN and Post -op Vital signs reviewed and stable  Post vital signs: Reviewed and stable  Last Vitals:  Vitals Value Taken Time  BP 100/59 09/27/22 1842  Temp    Pulse 55 09/27/22 1840  Resp 20 09/27/22 1840  SpO2 99 % 09/27/22 1840  Vitals shown include unvalidated device data.  Last Pain:  Vitals:   09/27/22 1436  TempSrc: Axillary  PainSc: 0-No pain      Patients Stated Pain Goal: 0 (09/25/22 0800)  Complications: No notable events documented.

## 2022-09-27 NOTE — Progress Notes (Signed)
The risks benefits and alternatives were discussed with the patient including but not limited to the risks of nonoperative treatment, versus surgical intervention including infection, bleeding, nerve injury, malunion, nonunion, the need for revision surgery, hardware prominence, hardware failure, the need for future arthroplasty, the need for hardware removal, blood clots, cardiopulmonary complications, morbidity, mortality, among others, and they were willing to proceed.    Plan right proximal humerus ORIF vs. RTSA.    Eulas Post, MD

## 2022-09-27 NOTE — Discharge Instructions (Signed)
Diet: As you were doing prior to hospitalization   Shower/Dressing:  May shower but keep the wounds dry, use an occlusive plastic wrap, NO SOAKING IN TUB.  If the bandage gets wet, change with a clean dry gauze. Otherwise, do not remove dressing. We will keep in place until follow-up visit in 2 weeks.  There are sticky tapes (steri-strips) on your wounds and all the stitches are absorbable.  Leave the steri-strips in place when changing your dressings, they will peel off with time, usually 2-3 weeks.  Activity:  Increase activity slowly as tolerated, but follow the weight bearing instructions below.  The rules on driving is that you can not be taking narcotics while you drive, and you must feel in control of the vehicle.    Weight Bearing:   No bearing weight with right arm  To prevent constipation: you may use a stool softener such as -  Colace (over the counter) 100 mg by mouth twice a day  Drink plenty of fluids (prune juice may be helpful) and high fiber foods Miralax (over the counter) for constipation as needed.    Itching:  If you experience itching with your medications, try taking only a single pain pill, or even half a pain pill at a time.  You may take up to 10 pain pills per day, and you can also use benadryl over the counter for itching or also to help with sleep.   Precautions:  If you experience chest pain or shortness of breath - call 911 immediately for transfer to the hospital emergency department!!  If you develop a fever greater that 101 F, purulent drainage from wound, increased redness or drainage from wound, or calf pain -- Call the office at [phone]                                                Follow- Up Appointment:  Please call for an appointment to be seen in 2 weeks [place] - [phone]

## 2022-09-27 NOTE — Anesthesia Procedure Notes (Signed)
Anesthesia Regional Block: Interscalene brachial plexus block   Pre-Anesthetic Checklist: , timeout performed,  Correct Patient, Correct Site, Correct Laterality,  Correct Procedure, Correct Position, site marked,  Risks and benefits discussed,  Surgical consent,  Pre-op evaluation,  At surgeon's request and post-op pain management  Laterality: Right and Upper  Prep: chloraprep       Needles:  Injection technique: Single-shot  Needle Type: Echogenic Needle     Needle Length: 9cm  Needle Gauge: 21     Additional Needles:   Procedures:,,,, ultrasound used (permanent image in chart),,    Narrative:  Start time: 09/27/2022 3:01 PM End time: 09/27/2022 3:07 PM Injection made incrementally with aspirations every 5 mL.  Performed by: Personally  Anesthesiologist: Jairo Ben, MD  Additional Notes: Pt identified in Holding room.  Monitors applied. Working IV access confirmed. Sterile prep R clavicle and neck.  #21ga ECHOgenic Arrow block needle to interscalene brachial plexus with US guidance.  10cc 0.5% Bupivacaine 1:200k epi, 10cc Exparel injected incrementally after negative test dose.  Patient asymptomatic, VSS, no heme aspirated, tolerated well.   Sandford Craze, MD

## 2022-09-27 NOTE — Progress Notes (Signed)
Subjective: Patient reports doing well no headaches  Objective: Vital signs in last 24 hours: Temp:  [98.1 F (36.7 C)-99 F (37.2 C)] 98.2 F (36.8 C) (04/16 0400) Pulse Rate:  [62-101] 62 (04/16 0700) Resp:  [10-19] 13 (04/16 0700) BP: (122-163)/(74-104) 143/83 (04/16 0700) SpO2:  [95 %-99 %] 97 % (04/16 0700)  Intake/Output from previous day: 04/15 0701 - 04/16 0700 In: 1749.2 [I.V.:1749.2] Out: 1560 [Urine:1560] Intake/Output this shift: No intake/output data recorded.  Neurologic: Grossly normal  Lab Results: Lab Results  Component Value Date   WBC 3.8 (L) 09/26/2022   HGB 10.9 (L) 09/26/2022   HCT 32.5 (L) 09/26/2022   MCV 105.2 (H) 09/26/2022   PLT 84 (L) 09/26/2022   Lab Results  Component Value Date   INR 1.3 (H) 09/26/2022   BMET Lab Results  Component Value Date   NA 136 09/26/2022   K 4.1 09/26/2022   CL 105 09/26/2022   CO2 23 09/26/2022   GLUCOSE 94 09/26/2022   BUN 10 09/26/2022   CREATININE 0.89 09/26/2022   CALCIUM 8.5 (L) 09/26/2022    Studies/Results: No results found.  Assessment/Plan: S/p fall with SDH. Awaiting MMA with Dr. Conchita Paris   LOS: 3 days    Tiana Loft [facility] 09/27/2022, 7:56 AM

## 2022-09-27 NOTE — Anesthesia Procedure Notes (Signed)
Procedure Name: Intubation Date/Time: 09/27/2022 3:34 PM  Performed by: Tressia Miners, CRNAPre-anesthesia Checklist: Patient identified, Emergency Drugs available, Suction available and Patient being monitored Patient Re-evaluated:Patient Re-evaluated prior to induction Oxygen Delivery Method: Circle System Utilized Preoxygenation: Pre-oxygenation with 100% oxygen Induction Type: IV induction Ventilation: Mask ventilation without difficulty Laryngoscope Size: Mac and 4 Grade View: Grade II Tube type: Oral Tube size: 7.5 mm Number of attempts: 1 Airway Equipment and Method: Stylet Placement Confirmation: ETT inserted through vocal cords under direct vision, positive ETCO2 and breath sounds checked- equal and bilateral Secured at: 23 cm Tube secured with: Tape Dental Injury: Teeth and Oropharynx as per pre-operative assessment

## 2022-09-27 NOTE — Op Note (Signed)
09/24/2022 - 09/27/2022  3:05 PM  PATIENT:  Richard Andrews    PRE-OPERATIVE DIAGNOSIS:  RIGHT PROXIMAL HUMERUS FRACTURE  POST-OPERATIVE DIAGNOSIS:  Same  PROCEDURE:  OPEN REDUCTION INTERNAL FIXATION (ORIF) PROXIMAL HUMERUS FRACTURE  SURGEON:  Eulas Post, MD  PHYSICIAN ASSISTANT: Janine Ores, PA-C, present and scrubbed throughout the case, critical for completion in a timely fashion, and for retraction, instrumentation, and closure.  ANESTHESIA:   General  ESTIMATED BLOOD LOSS: 200 mL  PREOPERATIVE INDICATIONS:  Richard Andrews is a  62 y.o. male with a diagnosis of RIGHT PROXIMAL HUMERUS FRACTURE who elected for surgical management.  He also had an intracranial hemorrhage, and significant alcoholism.  The risks benefits and alternatives were discussed with the patient including but not limited to the risks of nonoperative treatment, versus surgical intervention including infection, bleeding, nerve injury, malunion, nonunion, the need for revision surgery, hardware prominence, hardware failure, the need for hardware removal, blood clots, cardiopulmonary complications, conversion to arthroplasty, morbidity, mortality, among others, and they were willing to proceed.  Predicted outcome is good, although there will be at least a six to nine month expected recovery.   OPERATIVE IMPLANTS: Biomet ALPS proximal humerus locking plate.  OPERATIVE FINDINGS: Displaced proximal humerus fracture.  UNIQUE ASPECTS OF THE CASE: It was quite difficult to disimpact the head, following every time I got the x-ray, the fracture pattern swiveled, such that it looks like I was in the wrong place with the plate, but ultimately I was able to get excellent fixation with anatomic alignment.  OPERATIVE PROCEDURE: The patient was brought to the operating room and placed in the supine position. General anesthesia was administered. IV antibiotics were given. He was placed in the beach chair position. All bony  prominences were padded. The upper extremity was prepped and draped in usual sterile fashion. Deltopectoral incision was performed.  I exposed the fracture site, and placed deep retractors. I did not tenotomize the biceps tendon. This was left in place. I elevated a small portion of the deltoid off of the shaft, in order to gain access for the plate. I placed two #5 max braid supraspinatus and one #5 max braid subscapularis stitches, and then reduced the head onto the shaft. This was maintained in satisfactory position.  I applied the plate and secured it into the sliding hole first. I confirmed position of the reduction and the plate with C-arm, and I placed a total of 2 guidewires into the appropriate position in the head. I was satisfied that the plate was distal appropriately, and then secured the plate proximally with smooth pegs, taking care to prevent penetration into the arch articular surface, using C-arm, as well as manual feel using a hand drill.  I then secured the plate distally using another cortical screw. I then passed the #5 max braid sutures from the subscapularis and supraspinatus through the plate and secured the tuberosities. Once complete fixation and reduction of been achieved, took final C-arm pictures, and irrigated the wounds copiously, and repaired the deltopectoral interval with Vicryl followed by Vicryl for the subcutaneous tissue with Monocryl and Steri-Strips for the skin. He was placed in a sling. He had a preoperative regional block as well. He tolerated the procedure well with no complications.

## 2022-09-27 NOTE — Progress Notes (Signed)
  NEUROSURGERY PROGRESS NOTE   Unable to perform right MMA embolization yesterday due to anesthesia availability. Will cont to follow to reschedule procedure later this admission.  Lisbeth Renshaw, MD [facility]

## 2022-09-27 NOTE — Progress Notes (Signed)
   Trauma/Critical Care Follow Up Note  Subjective:    Overnight Issues:   Objective:  Vital signs for last 24 hours: Temp:  [97.8 F (36.6 C)-100.6 F (38.1 C)] 97.8 F (36.6 C) (04/16 1835) Pulse Rate:  [56-95] 56 (04/16 1845) Resp:  [10-25] 10 (04/16 1845) BP: (97-156)/(50-104) 105/61 (04/16 1845) SpO2:  [95 %-100 %] 95 % (04/16 1845)  Hemodynamic parameters for last 24 hours:    Intake/Output from previous day: 04/15 0701 - 04/16 0700 In: 1749.2 [I.V.:1749.2] Out: 1560 [Urine:1560]  Intake/Output this shift: No intake/output data recorded.  Vent settings for last 24 hours:    Physical Exam:  Gen: comfortable, no distress Neuro: follows commands, alert, communicative HEENT: PERRL Neck: supple CV: RRR Pulm: unlabored breathing on RA Abd: soft, NT    GU: urine clear and yellow, +spontaneous voids Extr: wwp, no edema  Results for orders placed or performed during the hospital encounter of 09/24/22 (from the past 24 hour(s))  Surgical pcr screen     Status: Abnormal   Collection Time: 09/27/22  1:52 PM   Specimen: Nasal Mucosa; Nasal Swab  Result Value Ref Range   MRSA, PCR NEGATIVE NEGATIVE   Staphylococcus aureus POSITIVE (A) NEGATIVE  Type and screen [facility]     Status: None   Collection Time: 09/27/22  4:26 PM  Result Value Ref Range   ABO/RH(D) O POS    Antibody Screen NEG    Sample Expiration      09/30/2022,2359 Performed at [facility], [address], [place], [name] [address]     Assessment & Plan: The plan of care was discussed with the bedside nurse for the day, who is in agreement with this plan and no additional concerns were raised.   Present on Admission:  Fall    LOS: 3 days   Additional comments:I reviewed the patient's new clinical lab test results.   and I reviewed the patients new imaging test results.    Fall   SDH with slight shift, subacute - NSGY c/s, Dr. Eudelia Bunch. Conchita Paris, plans MMA  embolization later this hospitalization. Repeat CTH 4/14 stable.  R comminuted humeral head fx - to OR with ortho 4/16. Sling.  EtOH abuse - CIWA, monitor Thrombocytopenia - monitor - plt 84 today, no indication for txfn FEN - strict NPO for procedure, okay for diet post-op DVT - SCDs, LMWH Dispo - 4NP  Diamantina Monks, MD Trauma & General Surgery Please use [email or web address] to contact on call provider  09/27/2022  *Care during the described time interval was provided by me. I have reviewed this patient's available data, including medical history, events of note, physical examination and test results as part of my evaluation.

## 2022-09-27 NOTE — Anesthesia Preprocedure Evaluation (Signed)
Anesthesia Evaluation  Patient identified by MRN, date of birth, ID band Patient awake    Reviewed: Allergy & Precautions, NPO status , Patient's Chart, lab work & pertinent test results  History of Anesthesia Complications Negative for: history of anesthetic complications  Airway Mallampati: II  TM Distance: >3 FB Neck ROM: Full    Dental  (+) Poor Dentition, Missing, Chipped, Dental Advisory Given   Pulmonary Current Smoker and Patient abstained from smoking.   breath sounds clear to auscultation       Cardiovascular hypertension, Pt. on medications  Rhythm:Regular Rate:Normal  '22 ECHO: EF 60-65%. The LV has normal function, no regional wall motion abnormalities. Grade I diastolic dysfunction (impaired relaxation). RVF is normal, no significant valvular abnormalities    Neuro/Psych Seizures - (ETOH withdrawal),  SDH : neurosurgery following    GI/Hepatic Neg liver ROS,GERD  Medicated and Controlled,,  Endo/Other  negative endocrine ROS    Renal/GU negative Renal ROS     Musculoskeletal   Abdominal   Peds  Hematology Hb 10.9, plt 84k   Anesthesia Other Findings   Reproductive/Obstetrics                              Anesthesia Physical Anesthesia Plan  ASA: 3  Anesthesia Plan: General   Post-op Pain Management: Regional block*   Induction: Intravenous  PONV Risk Score and Plan: 1 and Ondansetron and Dexamethasone  Airway Management Planned: Oral ETT  Additional Equipment: None  Intra-op Plan:   Post-operative Plan: Extubation in OR  Informed Consent: I have reviewed the patients History and Physical, chart, labs and discussed the procedure including the risks, benefits and alternatives for the proposed anesthesia with the patient or authorized representative who has indicated his/her understanding and acceptance.     Dental advisory given  Plan Discussed with: CRNA and  Surgeon  Anesthesia Plan Comments: (Plan routine monitors, GETA with interscalene block for post op analgesia)         Anesthesia Quick Evaluation

## 2022-09-28 ENCOUNTER — Encounter (HOSPITAL_COMMUNITY): Payer: Self-pay | Admitting: Orthopedic Surgery

## 2022-09-28 LAB — CBC
HCT: 27.7 % — ABNORMAL LOW (ref 39.0–52.0)
Hemoglobin: 9.8 g/dL — ABNORMAL LOW (ref 13.0–17.0)
MCH: 35.9 pg — ABNORMAL HIGH (ref 26.0–34.0)
MCHC: 35.4 g/dL (ref 30.0–36.0)
MCV: 101.5 fL — ABNORMAL HIGH (ref 80.0–100.0)
Platelets: 85 10*3/uL — ABNORMAL LOW (ref 150–400)
RBC: 2.73 MIL/uL — ABNORMAL LOW (ref 4.22–5.81)
RDW: 12.1 % (ref 11.5–15.5)
WBC: 3.8 10*3/uL — ABNORMAL LOW (ref 4.0–10.5)
nRBC: 0 % (ref 0.0–0.2)

## 2022-09-28 LAB — BASIC METABOLIC PANEL
Anion gap: 10 (ref 5–15)
BUN: 9 mg/dL (ref 8–23)
CO2: 21 mmol/L — ABNORMAL LOW (ref 22–32)
Calcium: 8.7 mg/dL — ABNORMAL LOW (ref 8.9–10.3)
Chloride: 100 mmol/L (ref 98–111)
Creatinine, Ser: 0.78 mg/dL (ref 0.61–1.24)
GFR, Estimated: >60 mL/min (ref >60)
Glucose, Bld: 143 mg/dL — ABNORMAL HIGH (ref 70–99)
Potassium: 3.9 mmol/L (ref 3.5–5.1)
Sodium: 131 mmol/L — ABNORMAL LOW (ref 135–145)

## 2022-09-28 NOTE — Evaluation (Signed)
Occupational Therapy Evaluation Patient Details Name: Richard Andrews MRN: [id number] DOB: 1960-10-16 Today's Date: 09/28/2022   History of Present Illness Richard Andrews is a 62 y.o. male admitted to the [facility] emergency department yesterday after a fall. He had immediate severe pain to the right shoulder.  Shoulder x-ray was taken showing a right proximal humerus fracture.  CT also positive for right SDH.  He underwent right humerus ORIF on 4/16 via Dr Dion Saucier.  PMH positive for hypertension and left distal radius fracture.   Clinical Impression   Pt currently at max assist for simulated selfcare tasks sit to stand and stand step transfers to the recliner from the EOB.  Decreased awareness, memory, and cognitive processing noted throughout session.  RUE in sling with therapist providing education and assist on removal and donning while completing AAROM exercises for the elbow and AROM for the wrist and hand.  Feel he will benefit from acute care OT at this time and will benefit from intensive inpatient follow up therapy, >3 hours/day.        Recommendations for follow up therapy are one component of a multi-disciplinary discharge planning process, led by the attending physician.  Recommendations may be updated based on patient status, additional functional criteria and insurance authorization.   Assistance Recommended at Discharge Frequent or constant Supervision/Assistance  Patient can return home with the following A lot of help with walking and/or transfers;A lot of help with bathing/dressing/bathroom;Assist for transportation;Help with stairs or ramp for entrance;Direct supervision/assist for medications management;Assistance with cooking/housework;Direct supervision/assist for financial management    Functional Status Assessment  Patient has had a recent decline in their functional status and demonstrates the ability to make significant improvements in function in a reasonable and  predictable amount of time.  Equipment Recommendations  Other (comment) (TBD next venue of care)    Recommendations for Other Services Rehab consult     Precautions / Restrictions Precautions Precautions: Fall;Shoulder Shoulder Interventions: Shoulder sling/immobilizer;At all times;Off for dressing/bathing/exercises Precaution Comments: OK for AROM at elbow, wrist, and digits only Required Braces or Orthoses: Sling Restrictions Weight Bearing Restrictions: Yes RUE Weight Bearing: Non weight bearing      Mobility Bed Mobility Overal bed mobility: Needs Assistance Bed Mobility: Supine to Sit     Supine to sit: Max assist     General bed mobility comments: Pt needed assist for rolling on his left side, bringing LEs off of the bed, and then bringing trunk up to sitting    Transfers Overall transfer level: Needs assistance Equipment used: None Transfers: Sit to/from Stand, Bed to chair/wheelchair/BSC Sit to Stand: Max assist     Step pivot transfers: Max assist     General transfer comment: Slight ataxia/tremor noted with standing and pt maintaining knee flexion.  Decreased weightshifts for adequate stepping with wide BOS noted.      Balance Overall balance assessment: Needs assistance Sitting-balance support: Feet supported Sitting balance-Leahy Scale: Fair Sitting balance - Comments: No LOB noted when sitting EOB and working on donning gripper socks.     Standing balance-Leahy Scale: Poor Standing balance comment: Pt needs UE support and therapist assist for balance.                           ADL either performed or assessed with clinical judgement   ADL Overall ADL's : Needs assistance/impaired Eating/Feeding: Set up;Sitting Eating/Feeding Details (indicate cue type and reason): using LUE only  Upper Body Bathing: Minimal assistance;Sitting Upper Body Bathing Details (indicate cue type and reason): LUE only Lower Body Bathing: Maximal  assistance;Sit to/from stand Lower Body Bathing Details (indicate cue type and reason): simulated Upper Body Dressing : Maximal assistance;Sitting Upper Body Dressing Details (indicate cue type and reason): including sling Lower Body Dressing: Sit to/from stand;Total assistance Lower Body Dressing Details (indicate cue type and reason): total assist to donn gripper socks Toilet Transfer: Maximal assistance;Stand-pivot Toilet Transfer Details (indicate cue type and reason): simulated to recliner Toileting- Clothing Manipulation and Hygiene: Maximal assistance;Sit to/from stand Toileting - Clothing Manipulation Details (indicate cue type and reason): simulated sit to stand     Functional mobility during ADLs: Maximal assistance (stand step to the bedside recliner) General ADL Comments: Pt with dysarthric speech at times and difficult to understand.  Completed AAROM exercises EOB for the right elbow, wrist, and hand for 1 set of 10 reps.  Pt with decreased AROM at the elbow and the wrist against gravity.  Educated on donning and doffing sling but will need extensive practice secondary to cognitive impairment.  Noted body tremor in sitting as well as slight ataxia in standing.  He maintained knees flexed in standing with max assist for weightshift to take steps to the recliner.  He reports having his brother-in-law living with him and his son coming over occasionally.  He states his brother in law is on "disability" but doesn't use an assistive device and would be able to assist him.     Vision Baseline Vision/History: 1 Wears glasses (near vision/reading per report) Ability to See in Adequate Light: 0 Adequate Patient Visual Report: No change from baseline Vision Assessment?: Yes Eye Alignment: Impaired (comment) (right eye slightly esotropia present) Ocular Range of Motion: Within Functional Limits Alignment/Gaze Preference: Within Defined Limits Tracking/Visual Pursuits: Able to track stimulus  in all quads without difficulty Visual Fields: No apparent deficits     Perception  WFL   Praxis  [facility]    Pertinent Vitals/Pain Pain Assessment Pain Assessment: Faces Faces Pain Scale: Hurts a little bit Pain Location: right shoulder Pain Descriptors / Indicators: Discomfort Pain Intervention(s): Limited activity within patient's tolerance, Repositioned, Monitored during session     Hand Dominance Left   Extremity/Trunk Assessment Upper Extremity Assessment Upper Extremity Assessment: RUE deficits/detail RUE Deficits / Details: shoulder sling in place with precautions for no AROM at the shoulder.  Pt with decreased strength in the right elbow at 2/5 for flex/ext, wrist ext 2+/5, grip 3+/5. RUE Sensation: WNL RUE Coordination: decreased gross motor   Lower Extremity Assessment Lower Extremity Assessment: Defer to PT evaluation   Cervical / Trunk Assessment Cervical / Trunk Assessment: Normal   Communication Communication Communication: Other (comment) (slight dysarthria)   Cognition Arousal/Alertness: Awake/alert Behavior During Therapy: WFL for tasks assessed/performed Overall Cognitive Status: Impaired/Different from baseline Area of Impairment: Orientation, Attention, Memory, Awareness, Problem solving                 Orientation Level: Place, Time, Situation Current Attention Level: Sustained Memory: Decreased recall of precautions, Decreased short-term memory     Awareness: Intellectual Problem Solving: Slow processing, Difficulty sequencing, Requires verbal cues, Requires tactile cues General Comments: Pt not oriented to place, time or situation.                Home Living Family/patient expects to be discharged to:: Private residence Living Arrangements: Other (Comment) (brother-in-law and sometimes his son comes and stays) Available Help at Discharge: Available 24 hours/day (  brother-in-law is disabled but doesn't use assistive device.  Pt reports  he can provide assist) Type of Home: House Home Access: Ramped entrance     Home Layout: Two level;1/2 bath on main level Alternate Level Stairs-Number of Steps: approximately 14 Alternate Level Stairs-Rails: Right;Left;Can reach both Bathroom Shower/Tub: Tub/shower unit   Bathroom Toilet: Handicapped height     Home Equipment: Agricultural consultant (2 wheels)   Additional Comments: RW was his wife's mothers walker      Prior Functioning/Environment Prior Level of Function : Independent/Modified Independent                        OT Problem List: Decreased strength;Impaired balance (sitting and/or standing);Pain;Impaired UE functional use;Decreased knowledge of use of DME or AE;Decreased safety awareness;Decreased cognition;Decreased range of motion;Decreased coordination;Decreased knowledge of precautions      OT Treatment/Interventions: Self-care/ADL training;Patient/family education;Balance training;Neuromuscular education;Therapeutic activities;DME and/or AE instruction;Cognitive remediation/compensation;Therapeutic exercise    OT Goals(Current goals can be found in the care plan section) Acute Rehab OT Goals Patient Stated Goal: Pt did not state but agreeable to working with OT OT Goal Formulation: With patient Time For Goal Achievement: 10/12/22 Potential to Achieve Goals: Fair  OT Frequency: Min 2X/week       AM-PAC OT "6 Clicks" Daily Activity     Outcome Measure Help from another person eating meals?: A Little Help from another person taking care of personal grooming?: A Little Help from another person toileting, which includes using toliet, bedpan, or urinal?: A Lot Help from another person bathing (including washing, rinsing, drying)?: A Lot Help from another person to put on and taking off regular upper body clothing?: A Lot Help from another person to put on and taking off regular lower body clothing?: A Lot 6 Click Score: 14   End of Session Equipment  Utilized During Treatment: Gait belt;Other (comment) (sling) Nurse Communication: Mobility status  Activity Tolerance: Patient tolerated treatment well Patient left: in chair;with call bell/phone within reach;with chair alarm set  OT Visit Diagnosis: Unsteadiness on feet (R26.81);Other abnormalities of gait and mobility (R26.89);Repeated falls (R29.6);Muscle weakness (generalized) (M62.81);Pain;Other symptoms and signs involving cognitive function Pain - Right/Left: Right Pain - part of body: Shoulder                Time: 1478-2956 OT Time Calculation (min): 50 min Charges:  OT General Charges $OT Visit: 1 Visit OT Evaluation $OT Eval Moderate Complexity: 1 Mod OT Treatments $Self Care/Home Management : 23-37 mins  Perrin Maltese, OTR/L Acute Rehabilitation Services  Office [phone] 09/28/2022

## 2022-09-28 NOTE — Anesthesia Postprocedure Evaluation (Signed)
Anesthesia Post Note  Patient: Richard Andrews  Procedure(s) Performed: OPEN REDUCTION INTERNAL FIXATION (ORIF) PROXIMAL HUMERUS FRACTURE (Right: Shoulder)     Patient location during evaluation: PACU Anesthesia Type: General Level of consciousness: patient cooperative, sedated and oriented Pain management: pain level controlled Vital Signs Assessment: post-procedure vital signs reviewed and stable Respiratory status: spontaneous breathing, nonlabored ventilation, respiratory function stable and patient connected to nasal cannula oxygen Cardiovascular status: blood pressure returned to baseline and stable Postop Assessment: no apparent nausea or vomiting Anesthetic complications: no   No notable events documented.             Germaine Pomfret

## 2022-09-28 NOTE — Progress Notes (Signed)
I have tentatively rescheduled this patient's middle meningeal artery embolization this coming Friday afternoon pending anesthesia availability.   Lisbeth Renshaw, MD [facility]

## 2022-09-28 NOTE — Progress Notes (Signed)
Orthopedic Tech Progress Note Patient Details:  Richard Andrews 1961/03/24 [id number]  Due to patient having a broken humerus patient can not get over head frame  Patient ID: [name], male   DOB: [birth year], 62 y.o.   MRN: [id number]  [name] 09/28/2022, 5:09 PM

## 2022-09-28 NOTE — Progress Notes (Signed)
Inpatient Rehab Admissions Coordinator Note:   Per OT recs patient was screened for CIR candidacy by Stephania Fragmin, PT. At this time, pt appears to be a potential candidate for CIR. I will place an order for rehab consult for full assessment, per our protocol.  Please contact me any with questions.Estill Dooms, PT, DPT [phone] 09/28/22 4:44 PM

## 2022-09-28 NOTE — Progress Notes (Signed)
Trauma/Critical Care Follow Up Note  Subjective:    Overnight Issues:   Objective:  Vital signs for last 24 hours: Temp:  [97.5 F (36.4 C)-100.6 F (38.1 C)] 97.6 F (36.4 C) (04/17 0751) Pulse Rate:  [52-95] 61 (04/17 0751) Resp:  [10-25] 16 (04/17 0751) BP: (97-155)/(50-97) 120/84 (04/17 0751) SpO2:  [93 %-100 %] 98 % (04/17 0751)  Hemodynamic parameters for last 24 hours:    Intake/Output from previous day: 04/16 0701 - 04/17 0700 In: 2986.8 [I.V.:2436.8; IV Piggyback:550] Out: 2535 [Urine:2385; Blood:150]  Intake/Output this shift: Total I/O In: 400 [P.O.:400] Out: 800 [Urine:800]  Vent settings for last 24 hours:    Physical Exam:  Gen: comfortable, no distress Neuro: follows commands, alert, communicative HEENT: PERRL Neck: supple CV: RRR Pulm: unlabored breathing on RA Abd: soft, NT    GU: urine clear and yellow, +spontaneous voids Extr: wwp, no edema  Results for orders placed or performed during the hospital encounter of 09/24/22 (from the past 24 hour(s))  Surgical pcr screen     Status: Abnormal   Collection Time: 09/27/22  1:52 PM   Specimen: Nasal Mucosa; Nasal Swab  Result Value Ref Range   MRSA, PCR NEGATIVE NEGATIVE   Staphylococcus aureus POSITIVE (A) NEGATIVE  Type and screen [facility]     Status: None   Collection Time: 09/27/22  4:26 PM  Result Value Ref Range   ABO/RH(D) O POS    Antibody Screen NEG    Sample Expiration      09/30/2022,2359 Performed at [facility], [address] [place], [name] [address]   CBC     Status: Abnormal   Collection Time: 09/28/22  2:07 AM  Result Value Ref Range   WBC 3.8 (L) 4.0 - 10.5 K/uL   RBC 2.73 (L) 4.22 - 5.81 MIL/uL   Hemoglobin 9.8 (L) 13.0 - 17.0 g/dL   HCT 60.4 (L) 54.0 - 98.1 %   MCV 101.5 (H) 80.0 - 100.0 fL   MCH 35.9 (H) 26.0 - 34.0 pg   MCHC 35.4 30.0 - 36.0 g/dL   RDW 19.1 47.8 - 29.5 %   Platelets 85 (L) 150 - 400 K/uL   nRBC 0.0 0.0 - 0.2 %   Basic metabolic panel     Status: Abnormal   Collection Time: 09/28/22  2:07 AM  Result Value Ref Range   Sodium 131 (L) 135 - 145 mmol/L   Potassium 3.9 3.5 - 5.1 mmol/L   Chloride 100 98 - 111 mmol/L   CO2 21 (L) 22 - 32 mmol/L   Glucose, Bld 143 (H) 70 - 99 mg/dL   BUN 9 8 - 23 mg/dL   Creatinine, Ser 6.21 0.61 - 1.24 mg/dL   Calcium 8.7 (L) 8.9 - 10.3 mg/dL   GFR, Estimated >30 >86 mL/min   Anion gap 10 5 - 15    Assessment & Plan:  Present on Admission:  Fall    LOS: 4 days   Additional comments:I reviewed the patient's new clinical lab test results.   and I reviewed the patients new imaging test results.    Fall   SDH with slight shift, subacute - NSGY c/s, Dr. Syliva Overman, plans MMA embolization 4/19. Repeat CTH 4/14 stable.  R comminuted humeral head fx - ORIF with Dr. Dion Saucier 4/16. Sling.  EtOH abuse - CIWA, monitor Thrombocytopenia - monitor, plt 85 today FEN - reg diet DVT - SCDs, LMWH when plt >100K Dispo -  4NP, PT/OT/SLP  Diamantina Monks, MD Trauma & General Surgery Please use [email or web address] to contact on call provider  09/28/2022  *Care during the described time interval was provided by me. I have reviewed this patient's available data, including medical history, events of note, physical examination and test results as part of my evaluation.

## 2022-09-28 NOTE — Progress Notes (Signed)
Subjective: 1 Day Post-Op s/p Procedure(s): OPEN REDUCTION INTERNAL FIXATION (ORIF) PROXIMAL HUMERUS FRACTURE   Patient is alert, laying in bed, mitten in place on left hand. States no pain this morning, block appears to still be in place.  Denies chest pain, SOB, Calf pain. No nausea/vomiting. No other complaints.    Objective:  PE: VITALS:   Vitals:   09/27/22 2020 09/27/22 2254 09/28/22 0315 09/28/22 0751  BP: (!) 154/91 (!) 146/71 133/84 120/84  Pulse: 65  (!) 56 61  Resp: Temp: (!) 97.5 F (36.4 C) 97.6 F (36.4 C) 97.6 F (36.4 C) 97.6 F (36.4 C)  TempSrc: Oral Axillary Oral Oral  SpO2: 96%  98% 98%   General: laying in bed, in no acute distress Abd: soft, nontender MSK: RUE in sling, significant ecchymosis. No drainage on dressing. Able flex, extend, and abduct all fingers of right hand. Full rom at wrist and elbow. Able to feel light touch starting mid humerus and distal to this.   LABS  Results for orders placed or performed during the hospital encounter of 09/24/22 (from the past 24 hour(s))  Surgical pcr screen     Status: Abnormal   Collection Time: 09/27/22  1:52 PM   Specimen: Nasal Mucosa; Nasal Swab  Result Value Ref Range   MRSA, PCR NEGATIVE NEGATIVE   Staphylococcus aureus POSITIVE (A) NEGATIVE  Type and screen [facility]     Status: None   Collection Time: 09/27/22  4:26 PM  Result Value Ref Range   ABO/RH(D) O POS    Antibody Screen NEG    Sample Expiration      09/30/2022,2359 Performed at [facility], [address] [place], [name] [address]   CBC     Status: Abnormal   Collection Time: 09/28/22  2:07 AM  Result Value Ref Range   WBC 3.8 (L) 4.0 - 10.5 K/uL   RBC 2.73 (L) 4.22 - 5.81 MIL/uL   Hemoglobin 9.8 (L) 13.0 - 17.0 g/dL   HCT 60.4 (L) 54.0 - 98.1 %   MCV 101.5 (H) 80.0 - 100.0 fL   MCH 35.9 (H) 26.0 - 34.0 pg   MCHC 35.4 30.0 - 36.0 g/dL   RDW 19.1 47.8 - 29.5 %   Platelets 85 (L)  150 - 400 K/uL   nRBC 0.0 0.0 - 0.2 %  Basic metabolic panel     Status: Abnormal   Collection Time: 09/28/22  2:07 AM  Result Value Ref Range   Sodium 131 (L) 135 - 145 mmol/L   Potassium 3.9 3.5 - 5.1 mmol/L   Chloride 100 98 - 111 mmol/L   CO2 21 (L) 22 - 32 mmol/L   Glucose, Bld 143 (H) 70 - 99 mg/dL   BUN 9 8 - 23 mg/dL   Creatinine, Ser 6.21 0.61 - 1.24 mg/dL   Calcium 8.7 (L) 8.9 - 10.3 mg/dL   GFR, Estimated >30 >86 mL/min   Anion gap 10 5 - 15    DG Shoulder Right  Result Date: 09/27/2022 CLINICAL DATA:  ORIF proximal humerus fracture. Intraoperative fluoroscopy. EXAM: RIGHT SHOULDER - 2+ VIEW COMPARISON:  Right shoulder radiographs 09/24/2022, CT right shoulder 09/24/2022 FINDINGS: Images were performed intraoperatively without the presence of a radiologist. New mild plate and screw fixation of the previously seen markedly comminuted proximal humeral fracture. Improved alignment. Total fluoroscopy images: 2 Total fluoroscopy time: 98 seconds Total dose: Radiation Exposure Index (as  provided by the fluoroscopic device): 10.92 mGy air Kerma Please see intraoperative findings for further detail. IMPRESSION: Intraoperative fluoroscopy provided for ORIF of the proximal humerus. Electronically Signed   By: Neita Garnet M.D.   On: 09/27/2022 19:28   DG Shoulder Right Port  Result Date: 09/27/2022 CLINICAL DATA:  Closed fracture approximately humerus. Status post ORIF. Images performed in PACU. EXAM: RIGHT SHOULDER - 1 VIEW COMPARISON:  Right shoulder radiographs 09/24/2022, CT right shoulder 09/24/2022 FINDINGS: Interval lateral plate and screw fixation of the previously seen markedly comminuted an impacted proximal humerus fracture. Improved alignment on limited single frontal view. There cortical bone fragments medial to the proximal humeral surgical neck and diaphysis. Postoperative changes including mild subacromial/subdeltoid and subcutaneous air. Mild degenerative subchondral cystic  change within the clavicular head. IMPRESSION: Interval lateral plate and screw fixation of the previously seen markedly comminuted proximal humerus fracture. Improved alignment on limited single frontal view. Electronically Signed   By: Neita Garnet M.D.   On: 09/27/2022 19:27   DG C-Arm 1-60 Min-No Report  Result Date: 09/27/2022 Fluoroscopy was utilized by the requesting physician.  No radiographic interpretation.   DG C-Arm 1-60 Min-No Report  Result Date: 09/27/2022 Fluoroscopy was utilized by the requesting physician.  No radiographic interpretation.    Assessment/Plan: Right proximal humerus fracture  1 Day Post-Op s/p Procedure(s): OPEN REDUCTION INTERNAL FIXATION (ORIF) PROXIMAL HUMERUS FRACTURE - will watch Hbg, dropped to 9.8 this morning, platelets steady at 85  Weightbearing: NWB RUE, ok for AROM at elbow, wrist, and fingers.  Insicional and dressing care: Reinforce dressings as needed Orthopedic device(s): Keep in sling.  Pain control: continue current regimen, block has not worn off yet Follow - up plan: 2 weeks with Dr. Erskine Emery information:   Janine Ores, PA-C Weekdays 8-5  After hours and holidays please check [email or web address] for group call information for [facility]  Armida Sans 09/28/2022, 8:46 AM

## 2022-09-28 NOTE — Evaluation (Signed)
Speech Language Pathology Evaluation Patient Details Name: Richard Andrews MRN: [id number] DOB: 02/18/1961 Today's Date: 09/28/2022 Time: 7829-5621 SLP Time Calculation (min) (ACUTE ONLY): 26 min  Problem List:  Patient Active Problem List   Diagnosis Date Noted   Fall 09/24/2022   Alcohol withdrawal syndrome without complication    Gastritis and gastroduodenitis    Multiple gastric ulcers    Duodenal ulcer    Acute blood loss anemia    Alcohol withdrawal seizure with complication    GI bleed 01/19/2021   QT prolongation 01/19/2021   Electrolyte imbalance 02/06/2020   Macrocytic anemia 02/06/2020   Acute pain of right wrist 02/06/2020   Encounter for orthopedic follow-up care 01/28/2020   Open Colles' fracture of right radius 01/10/2020   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   Increased ammonia level 12/21/2016   Thrombocytopenia 12/21/2016   Macrocytosis 03/21/2016   Transaminitis 03/21/2016   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   Alcohol dependence 02/12/2016   Essential hypertension 01/22/2015   Past Medical History:  Past Medical History:  Diagnosis Date   Allergy    Distal radius fracture, left    Hypertension    Past Surgical History:  Past Surgical History:  Procedure Laterality Date   BIOPSY  01/22/2021   Procedure: BIOPSY;  Surgeon: Shellia Cleverly, DO;  Location: [facility];  Service: Gastroenterology;;   ESOPHAGOGASTRODUODENOSCOPY (EGD) WITH PROPOFOL N/A 01/22/2021   Procedure: ESOPHAGOGASTRODUODENOSCOPY (EGD) WITH PROPOFOL;  Surgeon: Shellia Cleverly, DO;  Location: [facility];  Service: Gastroenterology;  Laterality: N/A;   I & D EXTREMITY Right 01/10/2020   Procedure: ORIF RIGHT WRIST;  Surgeon: Dominica Severin, MD;  Location: [facility] OR;  Service: Orthopedics;  Laterality: Right;   OPEN REDUCTION INTERNAL FIXATION (ORIF) DISTAL RADIAL FRACTURE Left 01/28/2015   Procedure: OPEN REDUCTION INTERNAL FIXATION (ORIF) LEFT DISTAL RADIAL FRACTURE;  Surgeon: Tarry Kos, MD;  Location: [facility];  Service: Orthopedics;  Laterality: Left;   ORIF HUMERUS FRACTURE Right 09/27/2022   Procedure: OPEN REDUCTION INTERNAL FIXATION (ORIF) PROXIMAL HUMERUS FRACTURE;  Surgeon: Teryl Lucy, MD;  Location: [facility] OR;  Service: Orthopedics;  Laterality: Right;   TONSILLECTOMY     HPI:  Richard Andrews is a 62 y.o. male who was brought to the [facility] emergency department after a fall. He was found to have humerus fracture, now s/p ORIF.  Head CT revealed "Large (2.0 cm thick) mixed density right cerebral convexity subdural hemorrhage with hyperdense component posteriorly compatible with acute/recent hemorrhage. Trace leftward midline shift." This is believed to be from a fall a couple of weeks ago when pt hit head.  Pt Large (2.0 cm thick) mixed density right cerebral convexity  subdural hemorrhage with hyperdense component posteriorly compatible  with acute/recent hemorrhage. Trace leftward midline shift." Pt with history of hypertension and alcohol abuse   Assessment / Plan / Recommendation Clinical Impression  Pt presents with mild-moderate cognitive linguistic deficits. Pt was assessed using the COGNISTAT (see below for additional information).  Pt's baseline level of function is unknown.  Pt currently works as a Surveyor, [employer].  Pt had mild deficits across the board in attention and on language tasks. Pt benefited from phonemic cuing on 1 of 3 missed items in confrontational naming task.  He exhibited semantic paraphasia x1 ("train" for "bus"). He was able to self correct on a 4th item and exhibited use of circumlocution strategy.  Pt exhibited profound deficits on word recall and similarities tasks, but performed relatively well on caluclations and judgment.  Pt's speech is very dysarthric and c/b low volume, monotone mumbling.  RN says that pt's daughter reports that this happen at times when he is withdrawing after alcohol consumption.  Pt was able to speak  more clearly with prompts to be loud.  Pt exhihits flat affect and poor eye contact.   Pt denies deficits. He says there's nothing wrong with his brain.  Shared with him that CT showed large SDH to which he replied "that could happen to anyone after an accident."  Pt did not decline to participated in speech therapy to address deficits, but he did not agree either.    Recommend trial therapy to determine if pt will participate and might benefit from intervention to address the above noted deficits.  COGNISTAT: All subtests are within the average range, except where otherwise specified.  Orientation:  10/12 Attention: 4/8, Mild Impairment Comprehension: 4/6 Mild Impairment Repetition: 9/12, Mild Impairment Naming: 5/8, Mild Impairment Construction: not assessed Memory: 1/12, Severe-Profound Impairment Calculations: 3/4 Similarities: 1/8, Severe-Profound Impairment Judgment: 4/6     SLP Assessment  SLP Recommendation/Assessment: Patient needs continued Speech Lanaguage Pathology Services SLP Visit Diagnosis: Cognitive communication deficit (R41.841);Dysarthria and anarthria (R47.1);Aphasia (R47.01)    Recommendations for follow up therapy are one component of a multi-disciplinary discharge planning process, led by the attending physician.  Recommendations may be updated based on patient status, additional functional criteria and insurance authorization.    Follow Up Recommendations   (Continue ST at next level of care)    Assistance Recommended at Discharge  Frequent or constant Supervision/Assistance  Functional Status Assessment Patient has had a recent decline in their functional status and demonstrates the ability to make significant improvements in function in a reasonable and predictable amount of time.  Frequency and Duration min 2x/week  2 weeks      SLP Evaluation Cognition  Overall Cognitive Status: Impaired/Different from baseline Orientation Level: Oriented X4 Year:  2024 Month: April Day of Week: Incorrect Attention: Focused;Sustained Focused Attention: Impaired Focused Attention Impairment: Verbal basic Sustained Attention: Impaired Sustained Attention Impairment: Verbal basic Memory: Impaired Memory Impairment: Decreased short term memory Awareness: Impaired Problem Solving: Impaired Executive Function: Reasoning Reasoning: Impaired       Comprehension  Auditory Comprehension Commands: Impaired Conversation: Simple Interfering Components: Attention Visual Recognition/Discrimination Discrimination: Not tested Reading Comprehension Reading Status: Not tested    Expression Expression Primary Mode of Expression: Verbal Verbal Expression Overall Verbal Expression: Impaired Repetition: Impaired Level of Impairment: Sentence level Naming: Impairment Confrontation: Impaired Verbal Errors: Semantic paraphasias Pragmatics: Impairment Impairments: Abnormal affect;Eye contact;Monotone Interfering Components: Speech intelligibility;Attention Effective Techniques: Phonemic cues Written Expression Dominant Hand: Left   Oral / Motor  Motor Speech Overall Motor Speech: Impaired Respiration: Within functional limits Phonation: Low vocal intensity Resonance: Within functional limits Articulation: Impaired Level of Impairment: Word Intelligibility: Intelligibility reduced Word: 50-74% accurate Phrase: 25-49% accurate Sentence: 0-24% accurate Conversation: 0-24% accurate Effective Techniques: Increased vocal intensity            Kerrie Pleasure, MA, CCC-SLP Acute Rehabilitation Services Office: [phone] 09/28/2022, 1:01 PM

## 2022-09-29 LAB — CBC
HCT: 27.4 % — ABNORMAL LOW (ref 39.0–52.0)
Hemoglobin: 9.9 g/dL — ABNORMAL LOW (ref 13.0–17.0)
MCH: 36 pg — ABNORMAL HIGH (ref 26.0–34.0)
MCHC: 36.1 g/dL — ABNORMAL HIGH (ref 30.0–36.0)
MCV: 99.6 fL (ref 80.0–100.0)
Platelets: 130 10*3/uL — ABNORMAL LOW (ref 150–400)
RBC: 2.75 MIL/uL — ABNORMAL LOW (ref 4.22–5.81)
RDW: 12.3 % (ref 11.5–15.5)
WBC: 5.5 10*3/uL (ref 4.0–10.5)
nRBC: 0 % (ref 0.0–0.2)

## 2022-09-29 MED ORDER — ENOXAPARIN SODIUM 30 MG/0.3ML IJ SOSY
30.0000 mg | PREFILLED_SYRINGE | Freq: Two times a day (BID) | INTRAMUSCULAR | Status: AC
  Administered 2022-09-29 (×2): 30 mg via SUBCUTANEOUS
  Filled 2022-09-29 (×2): qty 0.3

## 2022-09-29 MED ORDER — POLYETHYLENE GLYCOL 3350 17 G PO PACK
17.0000 g | PACK | Freq: Every day | ORAL | Status: DC
  Administered 2022-09-29 – 2022-09-30 (×2): 17 g via ORAL
  Filled 2022-09-29 (×2): qty 1

## 2022-09-29 MED ORDER — BISACODYL 10 MG RE SUPP: 10.0000 mg | Freq: Every day | RECTAL | Status: DC | PRN

## 2022-09-29 NOTE — Evaluation (Signed)
Physical Therapy Evaluation Patient Details Name: Richard Andrews MRN: [id number] DOB: 20-Mar-1961 Today's Date: 09/29/2022  History of Present Illness  The pt is a 62 yo male presenting 4/13 after a fall onto R shoulder. +etoh. Imaging showed R proximal humerus fx and R subacute SDH. Pt with additional fall ~1 month ago for which he did not seek medical care, suspect SDH from prior fall. S/p ORIF R proximal humerus 4/16. PMH includes: HTN, L wrist fx.   Clinical Impression  Pt in bed upon arrival of PT, agreeable to evaluation at this time. Prior to admission the pt reports he was independent with mobility, working as a Emergency planning/management officer for a [employer], [employer]. The pt now presents with limitations in functional mobility, strength, coordination, and stability due to above dx, and will continue to benefit from skilled PT to address these deficits. He requires mod-maxA to complete bed mobility and sit-stand transfers due to poor initiation, problem solving, and coordination of movements. He also demos poor anterior wt shift for sit-stand and is dependent on assist from therapist due to limited power in BLE. The pt was able to manage some small steps to recliner but struggles with coordination and balance in standing despite maxA from therapist. Recommend continued skilled PT and intensive therapies after d/c to facilitate return to greatest level of independence.         Recommendations for follow up therapy are one component of a multi-disciplinary discharge planning process, led by the attending physician.  Recommendations may be updated based on patient status, additional functional criteria and insurance authorization.  Follow Up Recommendations       Assistance Recommended at Discharge Frequent or constant Supervision/Assistance  Patient can return home with the following  Two people to help with walking and/or transfers;Two people to help with bathing/dressing/bathroom;Assistance with  cooking/housework;Direct supervision/assist for medications management;Direct supervision/assist for financial management;Assist for transportation;Help with stairs or ramp for entrance    Equipment Recommendations Other (comment) (defer to post acute)  Recommendations for Other Services  Rehab consult    Functional Status Assessment Patient has had a recent decline in their functional status and demonstrates the ability to make significant improvements in function in a reasonable and predictable amount of time.     Precautions / Restrictions Precautions Precautions: Fall;Shoulder Shoulder Interventions: Shoulder sling/immobilizer;At all times;Off for dressing/bathing/exercises Precaution Booklet Issued: Yes (comment) Precaution Comments: OK for AROM at elbow, wrist, and digits only Required Braces or Orthoses: Sling Restrictions Weight Bearing Restrictions: Yes RUE Weight Bearing: Non weight bearing      Mobility  Bed Mobility Overal bed mobility: Needs Assistance Bed Mobility: Supine to Sit     Supine to sit: Mod assist     General bed mobility comments: pt pulling on therapist with LUE, max cues to not use RUE. assist to complete movement with LE    Transfers Overall transfer level: Needs assistance Equipment used: 1 person hand held assist Transfers: Sit to/from Stand, Bed to chair/wheelchair/BSC Sit to Stand: Mod assist   Step pivot transfers: Max assist       General transfer comment: modA to rise to standign, poor anterior wt shift and pt unable to maintain knees extended. significant difficulty coordinating steps laterally to chair, pt taking steps forwards, backwards, and to side. increased difficulty moving L foot    Ambulation/Gait Ambulation/Gait assistance: Max assist Gait Distance (Feet): 7 Feet Assistive device: 1 person hand held assist Gait Pattern/deviations: Step-to pattern, Decreased step length - left, Knee flexed in  stance - right, Knee flexed  in stance - left, Knees buckling, Shuffle, Ataxic, Scissoring Gait velocity: decreased     General Gait Details: pt needing max cues for steps, minimal clearance and movement of LLE even with assist to wt shift. pt sitting early despite max cues to maintain upright.   Modified Rankin (Stroke Patients Only) Modified Rankin (Stroke Patients Only) Pre-Morbid Rankin Score: No significant disability Modified Rankin: Moderately severe disability     Balance Overall balance assessment: Needs assistance Sitting-balance support: Feet supported Sitting balance-Leahy Scale: Fair Sitting balance - Comments: No LOB noted when sitting EOB and working on donning gripper socks.   Standing balance support: Single extremity supported, During functional activity Standing balance-Leahy Scale: Poor Standing balance comment: dependent on UE support and maxA                             Pertinent Vitals/Pain Pain Assessment Pain Assessment: No/denies pain Pain Intervention(s): Monitored during session    Home Living Family/patient expects to be discharged to:: Private residence Living Arrangements: Other (Comment) (brother-in-law and sometimes his son comes and stays) Available Help at Discharge: Available 24 hours/day (brother-in-law is disabled but doesn't use assistive device.  Pt reports he can provide assist) Type of Home: House Home Access: Ramped entrance     Alternate Level Stairs-Number of Steps: approximately 14 Home Layout: Two level;1/2 bath on main level Home Equipment: Rolling Walker (2 wheels) Additional Comments: RW was his wife's mothers walker    Prior Function Prior Level of Function : Independent/Modified Independent;Working/employed             Mobility Comments: pt reports no issues, but reports his legs are weak normally ADLs Comments: independent, working as a Emergency planning/management officer for [employer]   Dominant Hand: Left     Extremity/Trunk Assessment   Upper Extremity Assessment Upper Extremity Assessment: Defer to OT evaluation    Lower Extremity Assessment Lower Extremity Assessment: Generalized weakness;RLE deficits/detail;LLE deficits/detail (grossly 4/5 to MMT, but poor ability to maintain knee extension in standing or coordinate movements of LE for stepping to command.) RLE Deficits / Details: grossly 4/5 to MMT, poor tolerance of functional movement, sit-stand, and endurance in standing. RLE Coordination: decreased fine motor;decreased gross motor LLE Deficits / Details: grossly 4/5 to MMT, poor tolerance of functional movement, sit-stand, and endurance in standing. LLE Coordination: decreased fine motor;decreased gross motor    Cervical / Trunk Assessment Cervical / Trunk Assessment: Normal  Communication   Communication: Other (comment) (slight dysarthria)  Cognition Arousal/Alertness: Awake/alert Behavior During Therapy: Flat affect Overall Cognitive Status: Impaired/Different from baseline Area of Impairment: Orientation, Attention, Memory, Awareness, Problem solving                 Orientation Level: Time, Situation (pt able to recal [place] and date, not sure why he is here.) Current Attention Level: Sustained Memory: Decreased recall of precautions, Decreased short-term memory     Awareness: Intellectual Problem Solving: Slow processing, Difficulty sequencing, Requires verbal cues, Requires tactile cues General Comments: pt not aware of why he is in hospitale, flat affect and not conversational until directly prompted.        General Comments General comments (skin integrity, edema, etc.): VSS on RA    Exercises     Assessment/Plan    PT Assessment Patient needs continued PT services  PT Problem List Decreased strength;Decreased activity tolerance;Decreased balance;Decreased mobility;Decreased coordination;Decreased  cognition;Decreased safety awareness       PT  Treatment Interventions DME instruction;Gait training;Stair training;Functional mobility training;Therapeutic activities;Therapeutic exercise;Balance training;Neuromuscular re-education;Patient/family education    PT Goals (Current goals can be found in the Care Plan section)  Acute Rehab PT Goals Patient Stated Goal: to return home and to work PT Goal Formulation: With patient Time For Goal Achievement: 10/13/22 Potential to Achieve Goals: Good    Frequency Min 4X/week        AM-PAC PT "6 Clicks" Mobility  Outcome Measure Help needed turning from your back to your side while in a flat bed without using bedrails?: A Lot Help needed moving from lying on your back to sitting on the side of a flat bed without using bedrails?: A Lot Help needed moving to and from a bed to a chair (including a wheelchair)?: Total Help needed standing up from a chair using your arms (e.g., wheelchair or bedside chair)?: Total Help needed to walk in hospital room?: Total Help needed climbing 3-5 steps with a railing? : Total 6 Click Score: 8    End of Session Equipment Utilized During Treatment: Gait belt Activity Tolerance: Patient tolerated treatment well Patient left: in chair;with call bell/phone within reach;with chair alarm set Nurse Communication: Mobility status PT Visit Diagnosis: Other abnormalities of gait and mobility (R26.89);Unsteadiness on feet (R26.81);Muscle weakness (generalized) (M62.81);Ataxic gait (R26.0)    Time: 1610-9604 PT Time Calculation (min) (ACUTE ONLY): 22 min   Charges:   PT Evaluation $PT Eval Moderate Complexity: 1 Mod          Vickki Muff, PT, DPT   Acute Rehabilitation Department Office [phone] Secure Chat Communication Preferred  Ronnie Derby 09/29/2022, 10:33 AM

## 2022-09-29 NOTE — PMR Pre-admission (Signed)
PMR Admission Coordinator Pre-Admission Assessment   Patient: Richard Andrews is an 62 y.o., male MRN: [id number] DOB: 1960/08/21 Height: 6' (182.9 cm) Weight: 66.3 kg   Insurance Information HMO:     PPO: Yes     PCP:      IPA:      80/20:      OTHER: Group [id number] PRIMARY: [employer] [place]     Policy#: [id number]      Subscriber: self CM Name: Brayton El      Phone#: [phone]  Fax#: [phone]  Britney with [employer] [place] called with approval 4/26 for admit 4/26-5/2 with update due 5/3 Pre-Cert#: [id number]      Employer:  Benefits:  Phone #:      Name:  Eff Date: 06/13/2017- 06/12/9998 Deductible: $1,000 ($0 met) OOP Max: $5,000 ($0 met) CIR: 80% coverage; 20% co-insurance SNF: 80% coverage; 20% co-insurance Outpatient:  80% coverage; 20% co-insurance Home Health:  80% coverage; 20% co-insurance DME: 80% coverage; 20% co-insurance Providers: in network  SECONDARY:       Policy#:      Phone#:    Artist:       Phone#:    The Data processing manager" for patients in Inpatient Rehabilitation Facilities with attached "Privacy Act Statement-Health Care Records" was provided and verbally reviewed with: N/A   Emergency Contact Information Contact Information       Name Relation Home Work Mobile    Qian,Kayla Daughter     [phone]    BEACHER, EVERY     [phone]    Moone,Christina Spouse [phone]               Current Medical History  Patient Admitting Diagnosis: Fall, R SDH, R humerus fracture   History of Present Illness: A 62 yo male presented by EMS to [facility] Emergency Department on 09/1322 after a fall onto R shoulder. +etoh. Imaging showed R proximal humerus fx and R subacute SDH. Pt with additional fall ~1 month ago for which he did not seek medical care, suspect SDH from prior fall. He was transferred to [facility] later on 09/24/22 for further treatment.  On 09/27/22 patient underwent ORIF R proximal humerus fracture  by Dr. Dion Saucier.  PT/OT/SLP evaluations were completed with recommendations for acute inpatient rehab admission. Patient is to underwent a right MMA embolization on 09/30/22. Tolerated well post-op   Patient's medical record from [facility] has been reviewed by the rehabilitation admission coordinator and physician.   Past Medical History      Past Medical History:  Diagnosis Date   Allergy     Distal radius fracture, left     Hypertension        Has the patient had major surgery during 100 days prior to admission? Yes   Family History   family history includes Cancer in his father; Heart disease in his mother; Hyperlipidemia in his mother; Multiple myeloma in his sister; Skin cancer in his mother; Throat cancer in his sister.   Current Medications   Current Facility-Administered Medications:    acetaminophen (TYLENOL) tablet 650 mg, 650 mg, Oral, Q4H PRN, Armida Sans, PA-C, 650 mg at 09/25/22 1702   bisacodyl (DULCOLAX) suppository 10 mg, 10 mg, Rectal, Daily PRN, Barnetta Chapel, PA-C   chlordiazePOXIDE (LIBRIUM) capsule 25 mg, 25 mg, Oral, TID, Manson Passey, Blaine K, PA-C, 25 mg at 09/29/22 0932   Chlorhexidine Gluconate Cloth 2 % PADS 6 each, 6 each, Topical, Daily, Kris Mouton  N, MD, 6 each at 09/29/22 0923   docusate sodium (COLACE) capsule 100 mg, 100 mg, Oral, BID, Armida Sans, PA-C, 100 mg at 09/29/22 0923   enoxaparin (LOVENOX) injection 30 mg, 30 mg, Subcutaneous, Q12H, Barnetta Chapel, PA-C, 30 mg at 09/29/22 1244   folic acid (FOLVITE) tablet 1 mg, 1 mg, Oral, Daily, Manson Passey, Blaine K, PA-C, 1 mg at 09/29/22 2956   hydrALAZINE (APRESOLINE) injection 10 mg, 10 mg, Intravenous, Q2H PRN, Manson Passey, Blaine K, PA-C   menthol-cetylpyridinium (CEPACOL) lozenge 3 mg, 1 lozenge, Oral, PRN **OR** phenol (CHLORASEPTIC) mouth spray 1 spray, 1 spray, Mouth/Throat, PRN, Manson Passey, Blaine K, PA-C   morphine (PF) 2 MG/ML injection 1-2 mg, 1-2 mg, Intravenous, Q2H PRN, Manson Passey, Blaine K, PA-C,  2 mg at 09/28/22 0056   multivitamin with minerals tablet 1 tablet, 1 tablet, Oral, Daily, Janine Ores K, PA-C, 1 tablet at 09/29/22 2130   mupirocin ointment (BACTROBAN) 2 % 1 Application, 1 Application, Nasal, BID, Diamantina Monks, MD, 1 Application at 09/29/22 0923   ondansetron (ZOFRAN-ODT) disintegrating tablet 4 mg, 4 mg, Oral, Q6H PRN **OR** ondansetron (ZOFRAN) injection 4 mg, 4 mg, Intravenous, Q6H PRN, Armida Sans, PA-C   Oral care mouth rinse, 15 mL, Mouth Rinse, PRN, Manson Passey, Blaine K, PA-C   oxyCODONE (Oxy IR/ROXICODONE) immediate release tablet 10 mg, 10 mg, Oral, Q4H PRN, Manson Passey, Blaine K, PA-C, 10 mg at 09/27/22 0023   oxyCODONE (Oxy IR/ROXICODONE) immediate release tablet 5 mg, 5 mg, Oral, Q4H PRN, Manson Passey, Blaine K, PA-C   pantoprazole (PROTONIX) EC tablet 40 mg, 40 mg, Oral, Daily, 40 mg at 09/29/22 0923 **OR** pantoprazole (PROTONIX) injection 40 mg, 40 mg, Intravenous, Daily, Brown, Blaine K, PA-C, 40 mg at 09/25/22 1007   polyethylene glycol (MIRALAX / GLYCOLAX) packet 17 g, 17 g, Oral, Daily, Barnetta Chapel, PA-C, 17 g at 09/29/22 1243   thiamine (VITAMIN B1) tablet 100 mg, 100 mg, Oral, Daily, 100 mg at 09/29/22 8657 **OR** thiamine (VITAMIN B1) injection 100 mg, 100 mg, Intravenous, Daily, Manson Passey, Blaine K, PA-C   Patients Current Diet:  Diet Order                  Diet NPO time specified Except for: Sips with Meds  Diet effective ____             Diet regular Room service appropriate? Yes; Fluid consistency: Thin  Diet effective now                         Precautions / Restrictions Precautions Precautions: Fall, Shoulder Precaution Booklet Issued: Yes (comment) Precaution Comments: OK for AROM at elbow, wrist, and digits only Restrictions Weight Bearing Restrictions: Yes RUE Weight Bearing: Non weight bearing    Has the patient had 2 or more falls or a fall with injury in the past year? Yes   Prior Activity Level Community (5-7x/wk): Works FT as a  Emergency planning/management officer, was driving.   Prior Functional Level Self Care: Did the patient need help bathing, dressing, using the toilet or eating? Independent   Indoor Mobility: Did the patient need assistance with walking from room to room (with or without device)? Independent   Stairs: Did the patient need assistance with internal or external stairs (with or without device)? Independent   Functional Cognition: Did the patient need help planning regular tasks such as shopping or remembering to take medications? Independent   Patient Information Are you of Hispanic, Latino/a,or [place]  origin?: A. No, not of Hispanic, Latino/a, or Spanish origin What is your race?: A. White Do you need or want an interpreter to communicate with a doctor or health care staff?: 0. No   Patient's Response To:  Health Literacy and Transportation Is the patient able to respond to health literacy and transportation needs?: Yes Health Literacy - How often do you need to have someone help you when you read instructions, pamphlets, or other written material from your doctor or pharmacy?: Often In the past 12 months, has lack of transportation kept you from medical appointments or from getting medications?: No In the past 12 months, has lack of transportation kept you from meetings, work, or from getting things needed for daily living?: No   Home Assistive Devices / Equipment Home Equipment: Agricultural consultant (2 wheels)   Prior Device Use: Indicate devices/aids used by the patient prior to current illness, exacerbation or injury? None of the above   Current Functional Level Cognition   Overall Cognitive Status: Impaired/Different from baseline Current Attention Level: Sustained Orientation Level: Oriented to person, Oriented to place, Oriented to time, Disoriented to situation General Comments: pt not aware of why he is in hospitale, flat affect and not conversational until directly prompted. Attention: Focused,  Sustained Focused Attention: Impaired Focused Attention Impairment: Verbal basic Sustained Attention: Impaired Sustained Attention Impairment: Verbal basic Memory: Impaired Memory Impairment: Decreased short term memory Awareness: Impaired Problem Solving: Impaired Executive Function: Reasoning Reasoning: Impaired    Extremity Assessment (includes Sensation/Coordination)   Upper Extremity Assessment: Defer to OT evaluation RUE Deficits / Details: shoulder sling in place with precautions for no AROM at the shoulder.  Pt with decreased strength in the right elbow at 2/5 for flex/ext, wrist ext 2+/5, grip 3+/5. RUE Sensation: WNL RUE Coordination: decreased gross motor  Lower Extremity Assessment: Generalized weakness, RLE deficits/detail, LLE deficits/detail (grossly 4/5 to MMT, but poor ability to maintain knee extension in standing or coordinate movements of LE for stepping to command.) RLE Deficits / Details: grossly 4/5 to MMT, poor tolerance of functional movement, sit-stand, and endurance in standing. RLE Coordination: decreased fine motor, decreased gross motor LLE Deficits / Details: grossly 4/5 to MMT, poor tolerance of functional movement, sit-stand, and endurance in standing. LLE Coordination: decreased fine motor, decreased gross motor     ADLs   Overall ADL's : Needs assistance/impaired Eating/Feeding: Set up, Sitting Eating/Feeding Details (indicate cue type and reason): using LUE only Upper Body Bathing: Minimal assistance, Sitting Upper Body Bathing Details (indicate cue type and reason): LUE only Lower Body Bathing: Maximal assistance, Sit to/from stand Lower Body Bathing Details (indicate cue type and reason): simulated Upper Body Dressing : Maximal assistance, Sitting Upper Body Dressing Details (indicate cue type and reason): including sling Lower Body Dressing: Sit to/from stand, Total assistance Lower Body Dressing Details (indicate cue type and reason): total  assist to donn gripper socks Toilet Transfer: Maximal assistance, Stand-pivot Toilet Transfer Details (indicate cue type and reason): simulated to recliner Toileting- Clothing Manipulation and Hygiene: Maximal assistance, Sit to/from stand Toileting - Clothing Manipulation Details (indicate cue type and reason): simulated sit to stand Functional mobility during ADLs: Maximal assistance (stand step to the bedside recliner) General ADL Comments: Pt with dysarthric speech at times and difficult to understand.  Completed AAROM exercises EOB for the right elbow, wrist, and hand for 1 set of 10 reps.  Pt with decreased AROM at the elbow and the wrist against gravity.  Educated on donning and doffing sling but  will need extensive practice secondary to cognitive impairment.  Noted body tremor in sitting as well as slight ataxia in standing.  He maintained knees flexed in standing with max assist for weightshift to take steps to the recliner.  He reports having his brother-in-law living with him and his son coming over occasionally.  He states his brother in law is on "disability" but doesn't use an assistive device and would be able to assist him.     Mobility   Overal bed mobility: Needs Assistance Bed Mobility: Supine to Sit Supine to sit: Mod assist General bed mobility comments: pt pulling on therapist with LUE, max cues to not use RUE. assist to complete movement with LE     Transfers   Overall transfer level: Needs assistance Equipment used: 1 person hand held assist Transfers: Sit to/from Stand, Bed to chair/wheelchair/BSC Sit to Stand: Mod assist Bed to/from chair/wheelchair/BSC transfer type:: Step pivot Step pivot transfers: Max assist General transfer comment: modA to rise to standign, poor anterior wt shift and pt unable to maintain knees extended. significant difficulty coordinating steps laterally to chair, pt taking steps forwards, backwards, and to side. increased difficulty moving L  foot     Ambulation / Gait / Stairs / Wheelchair Mobility   Ambulation/Gait Ambulation/Gait assistance: Max Chemical engineer (Feet): 7 Feet Assistive device: 1 person hand held assist Gait Pattern/deviations: Step-to pattern, Decreased step length - left, Knee flexed in stance - right, Knee flexed in stance - left, Knees buckling, Shuffle, Ataxic, Scissoring General Gait Details: pt needing max cues for steps, minimal clearance and movement of LLE even with assist to wt shift. pt sitting early despite max cues to maintain upright. Gait velocity: decreased     Posture / Balance Dynamic Sitting Balance Sitting balance - Comments: No LOB noted when sitting EOB and working on donning gripper socks. Balance Overall balance assessment: Needs assistance Sitting-balance support: Feet supported Sitting balance-Leahy Scale: Fair Sitting balance - Comments: No LOB noted when sitting EOB and working on donning gripper socks. Standing balance support: Single extremity supported, During functional activity Standing balance-Leahy Scale: Poor Standing balance comment: dependent on UE support and maxA     Special needs/care consideration Skin Right shoulder post op dressing     Previous Home Environment (from acute therapy documentation) Living Arrangements: Other (Comment) (brother-in-law and sometimes his son comes and stays) Available Help at Discharge: Available 24 hours/day (brother-in-law is disabled but doesn't use assistive device.  Pt reports he can provide assist) Type of Home: House Home Layout: Two level, 1/2 bath on main level Alternate Level Stairs-Rails: Right, Left, Can reach both Alternate Level Stairs-Number of Steps: approximately 14 Home Access: Ramped entrance Bathroom Shower/Tub: Engineer, manufacturing systems: Handicapped height Bathroom Accessibility: Yes Additional Comments: RW was his wife's mothers walker   Discharge Living Setting Plans for Discharge Living  Setting: House, Lives with (comment) (Lives with his BIL Freddie.) Type of Home at Discharge: House Discharge Home Layout: Two level, 1/2 bath on main level, Bed/bath upstairs Alternate Level Stairs-Number of Steps: 12 steps to 2nd level with bedroom and full bath Discharge Home Access: Ramped entrance Discharge Bathroom Shower/Tub: Tub/shower unit, Curtain Discharge Bathroom Toilet: Standard Discharge Bathroom Accessibility: Yes How Accessible: Accessible via walker   Social/Family/Support Systems Patient Roles: Spouse, Parent (Has a wife, daughter and a son.) Contact Information: Willow Reczek - wife - [phone] Anticipated Caregiver: Rudell Cobb, wife and daughter Ability/Limitations of Caregiver: Rudell Cobb the BIL is cardiac disabled but not using a  device and can provide supervision and care. Caregiver Availability: 24/7 Discharge Plan Discussed with Primary Caregiver: Yes Is Caregiver In Agreement with Plan?: Yes Does Caregiver/Family have Issues with Lodging/Transportation while Pt is in Rehab?: No   Goals Patient/Family Goal for Rehab: PT/OT/SLP supervision to min assist goals Expected length of stay:16-18  days Pt/Family Agrees to Admission and willing to participate: Yes Program Orientation Provided & Reviewed with Pt/Caregiver Including Roles  & Responsibilities: Yes   Decrease burden of Care through IP rehab admission: N/A   Possible need for SNF placement upon discharge: Not planned   Patient Condition: I have reviewed medical records from [facility], spoken with CM, and patient, spouse, and daughter. I met with patient at the bedside and discussed via phone for inpatient rehabilitation assessment.  Patient will benefit from ongoing PT, OT, and SLP, can actively participate in 3 hours of therapy a day 5 days of the week, and can make measurable gains during the admission.  Patient will also benefit from the coordinated team approach during an Inpatient Acute  Rehabilitation admission.  The patient will receive intensive therapy as well as Rehabilitation physician, nursing, social worker, and care management interventions.  Due to bladder management, bowel management, safety, skin/wound care, disease management, medication administration, pain management, and patient education the patient requires 24 hour a day rehabilitation nursing.  The patient is currently Mod-Max  with mobility and basic ADLs.  Discharge setting and therapy post discharge at home with home health is anticipated.  Patient has agreed to participate in the Acute Inpatient Rehabilitation Program and will admit tomorrow.   Preadmission Screen Completed By:  Trish Mage, 09/29/2022 2:09 PM with updates by Megan Salon, MS, CCC-SLP   ______________________________________________________________________   Discussed status with Dr. Shearon Stalls  on 10/07/22  at 930  and received approval for admission today.   Admission Coordinator:  Trish Mage, RN, time 1228 Dorna Bloom 10/07/22    Assessment/Plan: Diagnosis: Does the need for close, 24 hr/day Medical supervision in concert with the patient's rehab needs make it unreasonable for this patient to be served in a less intensive setting? Yes Co-Morbidities requiring supervision/potential complications: SHD s/p MMA embolization, R humeral fractures, alcohol abuse in withdrawal, thrombocytopenia, anemia, constipation, urinary incontinence Due to bladder management, bowel management, safety, skin/wound care, disease management, medication administration, pain management, and patient education, does the patient require 24 hr/day rehab nursing? Yes Does the patient require coordinated care of a physician, rehab nurse, PT, OT, and SLP to address physical and functional deficits in the context of the above medical diagnosis(es)? Yes Addressing deficits in the following areas: balance, endurance, locomotion, strength, transferring, bowel/bladder control,  bathing, dressing, feeding, grooming, toileting, cognition, speech, and language Can the patient actively participate in an intensive therapy program of at least 3 hrs of therapy 5 days a week? Yes The potential for patient to make measurable gains while on inpatient rehab is good Anticipated functional outcomes upon discharge from inpatient rehab: min assist PT, min assist OT, min assist SLP Estimated rehab length of stay to reach the above functional goals is: 16-18 days Anticipated discharge destination: Home 10. Overall Rehab/Functional Prognosis: good     MD Signature:   Angelina Sheriff, DO 10/07/2022

## 2022-09-29 NOTE — TOC Initial Note (Signed)
Transition of Care [facility]) - Initial/Assessment Note    Patient Details  Name: Richard Andrews MRN: [id number] Date of Birth: January 27, 1961  [facility]) CM/SW Contact:    Glennon Mac, RN Phone Number: 09/29/2022, 4:40 PM  Clinical Narrative:                 The pt is a 62 yo male presenting 4/13 after a fall onto R shoulder. +etoh. Imaging showed R proximal humerus fx and R subacute SDH. Pt with additional fall ~1 month ago for which he did not seek medical care, suspect SDH from prior fall. S/p ORIF R proximal humerus 4/16. PMH includes: HTN, L wrist fx.  PTA, pain independent and living at home with brother-in-law and son, at times.  PT/OT recommending inpatient rehab; brother-in-law can provide 24/7  supervision at discharge, with occasional assistance from wife and daughter. Noted patient for MMA embolization tomorrow.  CIR admissions liaison to open case with [facility] after procedure.   Expected Discharge Plan: IP Rehab Facility Barriers to Discharge: Continued Medical Work up              Expected Discharge Plan and Services   Discharge Planning Services: CM Consult   Living arrangements for the past 2 months: Single Family Home                                      Prior Living Arrangements/Services Living arrangements for the past 2 months: Single Family Home Lives with:: Relatives Patient language and need for interpreter reviewed:: Yes Do you feel safe going back to the place where you live?: Yes      Need for Family Participation in Patient Care: Yes (Comment) Care giver support system in place?: Yes (comment)   Criminal Activity/Legal Involvement Pertinent to Current Situation/Hospitalization: No - Comment as needed               Emotional Assessment   Attitude/Demeanor/Rapport: Gracious Affect (typically observed): Flat Orientation: : Oriented to Self, Oriented to Place, Oriented to Situation      Admission  diagnosis:  Subdural hematoma [S06.5XAA] Fall [W19.XXXA] Closed fracture of proximal end of right humerus, unspecified fracture morphology, initial encounter [S42.201A] Patient Active Problem List   Diagnosis Date Noted   Fall 09/24/2022   Alcohol withdrawal syndrome without complication    Gastritis and gastroduodenitis    Multiple gastric ulcers    Duodenal ulcer    Acute blood loss anemia    Alcohol withdrawal seizure with complication    GI bleed 01/19/2021   QT prolongation 01/19/2021   Electrolyte imbalance 02/06/2020   Macrocytic anemia 02/06/2020   Acute pain of right wrist 02/06/2020   Encounter for orthopedic follow-up care 01/28/2020   Open Colles' fracture of right radius 01/10/2020   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 [name] [address] Phone: [phone] Fax: [phone]     Social Determinants of Health (SDOH) Social History: SDOH Screenings   Depression (PHQ2-9): Low Risk  (02/01/2021)  Tobacco Use: High Risk (  09/28/2022)   SDOH Interventions:     Readmission Risk Interventions     No data to display         Quintella Baton, RN, BSN  Trauma/Neuro ICU Case Manager [phone]

## 2022-09-29 NOTE — Progress Notes (Signed)
Patient seen and examined. Somnolent, but easily awoken. "Some" pain at right shoulder. Denies numbness or tingling down RUE. Denies chest pain, SOB, Calf pain. No nausea/vomiting. No other complaints. RUE in sling, significant ecchymosis. No drainage on dressing. Able flex, extend, and abduct all fingers of right hand. Full rom at wrist and elbow. Able to feel light touch throughout RUE.  2 day post-op right proximal humerus ORIF for right comminuted proximal humerus fracture  Hbg trending up to 9.9 and platelets trending up to 130 Weightbearing: NWB RUE, ok for AROM at elbow, wrist, and fingers.  Insicional and dressing care: Reinforce dressings as needed Orthopedic device(s): Keep in sling. Pain control: continue current regimen Follow - up plan: 2 weeks with Dr. Yvetta Coder, PA-C

## 2022-09-29 NOTE — Progress Notes (Addendum)
IP rehab admissions - I met with patient, but I do not feel that his cognition was adequate today to discuss rehab options.  I called his daughter, Richard Andrews, and I left a message.  I also called his son Richard Andrews and I left a message to return my call.  [phone].  I received a call back from Richard Andrews.  I also called patient's wife, Richard Andrews, who says that she is making decisions since she is still patient's wife.  She does not live with patient.  Patient lives with BIL Richard Andrews.  The plan would be rehab and then home with Richard Andrews who can provide 24/7 supervision.  Wife and daughter would then be in and out to assist with care.  Noted patient to have surgery tomorrow.  I will not open the case with [employer] until after surgery when we can determine what needs patient will have.  [phone]

## 2022-09-29 NOTE — Progress Notes (Addendum)
   Trauma/Critical Care Follow Up Note  Subjective:    Overnight Issues:  No new issues.  Voiding well, pain controlled.  No BM since admit Objective:  Vital signs for last 24 hours: Temp:  [97.5 F (36.4 C)-98.6 F (37 C)] 98.1 F (36.7 C) (04/18 0749) Pulse Rate:  [72-93] 72 (04/18 0900) Resp:  [13-17] 17 (04/18 0900) BP: (98-135)/(68-88) 135/84 (04/18 0900) SpO2:  [95 %-100 %] 100 % (04/18 0900)   Intake/Output from previous day: 04/17 0701 - 04/18 0700 In: 600 [P.O.:600] Out: 1800 [Urine:1800]  Intake/Output this shift: No intake/output data recorded.  Vent settings for last 24 hours:    Physical Exam:  Gen: comfortable, no distress Neuro: follows commands, alert, communicative, flat with slow response but oriented HEENT: PERRL Neck: supple CV: RRR Pulm: unlabored breathing on RA Abd: soft, NT    GU: urine clear but slightly orange Extr: wwp, dressing in place over right humeral head area  Results for orders placed or performed during the hospital encounter of 09/24/22 (from the past 24 hour(s))  CBC     Status: Abnormal   Collection Time: 09/29/22  4:18 AM  Result Value Ref Range   WBC 5.5 4.0 - 10.5 K/uL   RBC 2.75 (L) 4.22 - 5.81 MIL/uL   Hemoglobin 9.9 (L) 13.0 - 17.0 g/dL   HCT 40.9 (L) 81.1 - 91.4 %   MCV 99.6 80.0 - 100.0 fL   MCH 36.0 (H) 26.0 - 34.0 pg   MCHC 36.1 (H) 30.0 - 36.0 g/dL   RDW 78.2 95.6 - 21.3 %   Platelets 130 (L) 150 - 400 K/uL   nRBC 0.0 0.0 - 0.2 %    Assessment & Plan:  Present on Admission:  Fall    LOS: 5 days   Additional comments:I reviewed the patient's new clinical lab test results.   and I reviewed the patients new imaging test results.    Fall SDH with slight shift, subacute - NSGY c/s, Dr. Syliva Overman, plans MMA embolization 4/19. Repeat CTH 4/14 stable.  R comminuted humeral head fx - ORIF with Dr. Dion Saucier 4/16. Sling. NWB RUE, ok for AROM at elbow, wrist, and fingers EtOH abuse - CIWA,  monitor Thrombocytopenia - monitor, plt 130 today FEN - reg diet, NPO after 0900am 4/19 for NSGY procedure, add daily miralax and prn suppository for bowel regimen, already on colace DVT - SCDs, LMWH, will d/w NSGY today prior to procedure tomorrow Dispo - 4NP, PT/OT/SLP, likely CIR at DC  Letha Cape, PA-C Trauma & General Surgery Please use [email or web address] to contact on call provider  09/29/2022  *Care during the described time interval was provided by me. I have reviewed this patient's available data, including medical history, events of note, physical examination and test results as part of my evaluation.

## 2022-09-29 NOTE — Progress Notes (Signed)
  NEUROSURGERY PROGRESS NOTE   No issues overnight  EXAM:  BP 135/84   Pulse 72   Temp 98.1 F (36.7 C) (Oral)   Resp 17   SpO2 100%   Awake, alert Speech fluent, appropriate  CN grossly intact  5/5 BUE/BLE   IMPRESSION:  62 y.o. male with EtOH abuse with multiple falls and right convexity subacute/chronic SDH, but neurologically asymptomatic. Right humerus fracture s/p ORIF  PLAN: - Will plan on right MMA embolization tomorrow afternoon - Cont supportive care per trauma, NPO p 6a tomorrow  All patient's questions this am answered regarding procedure tomorrow. He remains willing to proceed as scheduled.   Lisbeth Renshaw, MD [facility]

## 2022-09-29 NOTE — Progress Notes (Signed)
Subjective: Patient reports no headaches, doing well   Objective: Vital signs in last 24 hours: Temp:  [97.5 F (36.4 C)-98.6 F (37 C)] 98.1 F (36.7 C) (04/18 0749) Pulse Rate:  [75-93] 80 (04/18 0749) Resp:  [13-17] 15 (04/18 0749) BP: (98-118)/(68-88) 118/76 (04/18 0749) SpO2:  [95 %-98 %] 98 % (04/18 0749)  Intake/Output from previous day: 04/17 0701 - 04/18 0700 In: 600 [P.O.:600] Out: 1800 [Urine:1800] Intake/Output this shift: No intake/output data recorded.  Neurologic: Grossly normal  Lab Results: Lab Results  Component Value Date   WBC 5.5 09/29/2022   HGB 9.9 (L) 09/29/2022   HCT 27.4 (L) 09/29/2022   MCV 99.6 09/29/2022   PLT 130 (L) 09/29/2022   Lab Results  Component Value Date   INR 1.3 (H) 09/26/2022   BMET Lab Results  Component Value Date   NA 131 (L) 09/28/2022   K 3.9 09/28/2022   CL 100 09/28/2022   CO2 21 (L) 09/28/2022   GLUCOSE 143 (H) 09/28/2022   BUN 9 09/28/2022   CREATININE 0.78 09/28/2022   CALCIUM 8.7 (L) 09/28/2022    Studies/Results: DG Shoulder Right  Result Date: 09/27/2022 CLINICAL DATA:  ORIF proximal humerus fracture. Intraoperative fluoroscopy. EXAM: RIGHT SHOULDER - 2+ VIEW COMPARISON:  Right shoulder radiographs 09/24/2022, CT right shoulder 09/24/2022 FINDINGS: Images were performed intraoperatively without the presence of a radiologist. New mild plate and screw fixation of the previously seen markedly comminuted proximal humeral fracture. Improved alignment. Total fluoroscopy images: 2 Total fluoroscopy time: 98 seconds Total dose: Radiation Exposure Index (as provided by the fluoroscopic device): 10.92 mGy air Kerma Please see intraoperative findings for further detail. IMPRESSION: Intraoperative fluoroscopy provided for ORIF of the proximal humerus. Electronically Signed   By: Neita Garnet M.D.   On: 09/27/2022 19:28   DG Shoulder Right Port  Result Date: 09/27/2022 CLINICAL DATA:  Closed fracture approximately  humerus. Status post ORIF. Images performed in PACU. EXAM: RIGHT SHOULDER - 1 VIEW COMPARISON:  Right shoulder radiographs 09/24/2022, CT right shoulder 09/24/2022 FINDINGS: Interval lateral plate and screw fixation of the previously seen markedly comminuted an impacted proximal humerus fracture. Improved alignment on limited single frontal view. There cortical bone fragments medial to the proximal humeral surgical neck and diaphysis. Postoperative changes including mild subacromial/subdeltoid and subcutaneous air. Mild degenerative subchondral cystic change within the clavicular head. IMPRESSION: Interval lateral plate and screw fixation of the previously seen markedly comminuted proximal humerus fracture. Improved alignment on limited single frontal view. Electronically Signed   By: Neita Garnet M.D.   On: 09/27/2022 19:27   DG C-Arm 1-60 Min-No Report  Result Date: 09/27/2022 Fluoroscopy was utilized by the requesting physician.  No radiographic interpretation.   DG C-Arm 1-60 Min-No Report  Result Date: 09/27/2022 Fluoroscopy was utilized by the requesting physician.  No radiographic interpretation.    Assessment/Plan: S/p SDH. Awaiting MMA embolization by Dr. Conchita Paris. Doing well from neurosurgical standpoint.    LOS: 5 days    Richard Andrews 09/29/2022, 8:01 AM

## 2022-09-30 ENCOUNTER — Encounter (HOSPITAL_COMMUNITY): Payer: Self-pay

## 2022-09-30 ENCOUNTER — Inpatient Hospital Stay (HOSPITAL_COMMUNITY): Payer: BC Managed Care – PPO | Admitting: Certified Registered"

## 2022-09-30 ENCOUNTER — Encounter (HOSPITAL_COMMUNITY): Admission: EM | Disposition: A | Discharge status: Rehabilitation Facility | Payer: Self-pay | Source: Home / Self Care

## 2022-09-30 ENCOUNTER — Inpatient Hospital Stay (HOSPITAL_COMMUNITY): Payer: BC Managed Care – PPO

## 2022-09-30 HISTORY — PX: IR ANGIOGRAM FOLLOW UP STUDY: IMG697

## 2022-09-30 HISTORY — PX: IR ANGIO INTRA EXTRACRAN SEL INTERNAL CAROTID UNI R MOD SED: IMG5362

## 2022-09-30 HISTORY — PX: IR TRANSCATH/EMBOLIZ: IMG695

## 2022-09-30 HISTORY — PX: RADIOLOGY WITH ANESTHESIA: SHX6223

## 2022-09-30 SURGERY — Surgical Case
Anesthesia: *Unknown

## 2022-09-30 SURGERY — IR WITH ANESTHESIA
Anesthesia: General

## 2022-09-30 MED ORDER — DEXAMETHASONE SODIUM PHOSPHATE 10 MG/ML IJ SOLN
INTRAMUSCULAR | Status: DC | PRN
  Administered 2022-09-30: 5 mg via INTRAVENOUS

## 2022-09-30 MED ORDER — LIDOCAINE HCL (CARDIAC) PF 100 MG/5ML IV SOSY
PREFILLED_SYRINGE | INTRAVENOUS | Status: DC | PRN
  Administered 2022-09-30: 80 mg via INTRAVENOUS

## 2022-09-30 MED ORDER — AMISULPRIDE (ANTIEMETIC) 5 MG/2ML IV SOLN: 10.0000 mg | Freq: Once | INTRAVENOUS | Status: DC | PRN

## 2022-09-30 MED ORDER — CHLORHEXIDINE GLUCONATE 0.12 % MT SOLN
OROMUCOSAL | Status: AC
  Filled 2022-09-30: qty 15

## 2022-09-30 MED ORDER — HEPARIN SODIUM (PORCINE) 1000 UNIT/ML IJ SOLN
INTRAMUSCULAR | Status: DC | PRN
  Administered 2022-09-30: 3000 [IU] via INTRAVENOUS

## 2022-09-30 MED ORDER — IOHEXOL 300 MG/ML  SOLN
150.0000 mL | Freq: Once | INTRAMUSCULAR | Status: AC | PRN
  Administered 2022-09-30: 35 mL via INTRA_ARTERIAL

## 2022-09-30 MED ORDER — MIDAZOLAM HCL 5 MG/5ML IJ SOLN
INTRAMUSCULAR | Status: DC | PRN
  Administered 2022-09-30: 2 mg via INTRAVENOUS

## 2022-09-30 MED ORDER — SENNOSIDES-DOCUSATE SODIUM 8.6-50 MG PO TABS
1.0000 | ORAL_TABLET | Freq: Two times a day (BID) | ORAL | Status: DC
  Administered 2022-09-30 – 2022-10-07 (×13): 1 via ORAL
  Filled 2022-09-30 (×13): qty 1

## 2022-09-30 MED ORDER — FENTANYL CITRATE (PF) 100 MCG/2ML IJ SOLN
INTRAMUSCULAR | Status: DC | PRN
  Administered 2022-09-30 (×2): 50 ug via INTRAVENOUS

## 2022-09-30 MED ORDER — SUGAMMADEX SODIUM 200 MG/2ML IV SOLN
INTRAVENOUS | Status: DC | PRN
  Administered 2022-09-30: 300 mg via INTRAVENOUS

## 2022-09-30 MED ORDER — FENTANYL CITRATE (PF) 100 MCG/2ML IJ SOLN: 25.0000 ug | INTRAMUSCULAR | Status: DC | PRN

## 2022-09-30 MED ORDER — ONDANSETRON HCL 4 MG/2ML IJ SOLN
INTRAMUSCULAR | Status: DC | PRN
  Administered 2022-09-30: 4 mg via INTRAVENOUS

## 2022-09-30 MED ORDER — ROCURONIUM BROMIDE 100 MG/10ML IV SOLN
INTRAVENOUS | Status: DC | PRN
  Administered 2022-09-30: 80 mg via INTRAVENOUS

## 2022-09-30 MED ORDER — FENTANYL CITRATE (PF) 100 MCG/2ML IJ SOLN
INTRAMUSCULAR | Status: AC
  Filled 2022-09-30: qty 2

## 2022-09-30 MED ORDER — LACTATED RINGERS IV SOLN: INTRAVENOUS | Status: DC | PRN

## 2022-09-30 MED ORDER — POLYETHYLENE GLYCOL 3350 17 G PO PACK
17.0000 g | PACK | Freq: Two times a day (BID) | ORAL | Status: DC
  Administered 2022-10-01 – 2022-10-07 (×9): 17 g via ORAL
  Filled 2022-09-30 (×10): qty 1

## 2022-09-30 MED ORDER — EPHEDRINE SULFATE (PRESSORS) 50 MG/ML IJ SOLN
INTRAMUSCULAR | Status: DC | PRN
  Administered 2022-09-30 (×2): 2.5 mg via INTRAVENOUS

## 2022-09-30 MED ORDER — PROPOFOL 10 MG/ML IV BOLUS
INTRAVENOUS | Status: DC | PRN
  Administered 2022-09-30: 30 mg via INTRAVENOUS
  Administered 2022-09-30: 150 mg via INTRAVENOUS

## 2022-09-30 MED ORDER — MIDAZOLAM HCL 2 MG/2ML IJ SOLN
INTRAMUSCULAR | Status: AC
  Filled 2022-09-30: qty 2

## 2022-09-30 NOTE — Progress Notes (Signed)
  NEUROSURGERY PROGRESS NOTE   No issues overnight  EXAM:  BP 116/73 (BP Location: Left Arm)   Pulse 64   Temp 98 F (36.7 C) (Oral)   Resp 14   Ht 6' (1.829 m)   Wt 66.3 kg   SpO2 98%   BMI 19.82 kg/m   Awake, alert Speech fluent, appropriate  CN grossly intact  5/5 BUE/BLE   IMPRESSION:  62 y.o. male with EtOH abuse with multiple falls and right convexity subacute/chronic SDH, but neurologically asymptomatic. Right humerus fracture s/p ORIF  PLAN: - Will plan on right MMA embolization this afternoon  All patient's questions this am answered regarding procedure. He remains willing to proceed as scheduled.   Lisbeth Renshaw, MD [facility]

## 2022-09-30 NOTE — Progress Notes (Signed)
Physical Therapy Treatment Patient Details Name: Richard Andrews MRN: [id number] DOB: 1960-12-09 Today's Date: 09/30/2022   History of Present Illness The pt is a 62 yo male presenting 4/13 after a fall onto R shoulder. +etoh. Imaging showed R proximal humerus fx and R subacute SDH. Pt with additional fall ~1 month ago for which he did not seek medical care, suspect SDH from prior fall. S/p ORIF R proximal humerus 4/16. PMH includes: HTN, L wrist fx.    PT Comments    The pt was agreeable to session with focus on progressing OOB mobility. The pt continues to need cues for decreased use of RUE with bed mobility, and was able to complete with use of LUE and assist from therapist. Once sitting, the pt made the most progress with sit-stand transfers and short-distance ambulation in his room. He was able to progress to 10-15 ft bouts with HHA for support and modA. He continues to have significant LOB needing assist to recover, but had no instances of knee buckling with ambulation. Recommendations remain appropriate.    Recommendations for follow up therapy are one component of a multi-disciplinary discharge planning process, led by the attending physician.  Recommendations may be updated based on patient status, additional functional criteria and insurance authorization.  Follow Up Recommendations       Assistance Recommended at Discharge Frequent or constant Supervision/Assistance  Patient can return home with the following Two people to help with walking and/or transfers;Two people to help with bathing/dressing/bathroom;Assistance with cooking/housework;Direct supervision/assist for medications management;Direct supervision/assist for financial management;Assist for transportation;Help with stairs or ramp for entrance   Equipment Recommendations  Other (comment) (defer to post acute)    Recommendations for Other Services       Precautions / Restrictions Precautions Precautions:  Fall;Shoulder Shoulder Interventions: Shoulder sling/immobilizer;At all times;Off for dressing/bathing/exercises Precaution Booklet Issued: Yes (comment) Precaution Comments: OK for AROM at elbow, wrist, and digits only Required Braces or Orthoses: Sling Restrictions Weight Bearing Restrictions: Yes RUE Weight Bearing: Non weight bearing     Mobility  Bed Mobility Overal bed mobility: Needs Assistance Bed Mobility: Supine to Sit, Sit to Supine     Supine to sit: Min assist Sit to supine: Min assist   General bed mobility comments: pt pulling on therapist with LUE, max cues to not use RUE. assist to complete movement with LE    Transfers Overall transfer level: Needs assistance Equipment used: 1 person hand held assist Transfers: Sit to/from Stand, Bed to chair/wheelchair/BSC Sit to Stand: Mod assist, Min assist   Step pivot transfers: Mod assist       General transfer comment: modA initially to rise to standing, improved to minA within session. pt benefits from increased static stance to get balanced and cues to extend at knees. pt needing modA to take pivotal steps    Ambulation/Gait Ambulation/Gait assistance: Max assist, Mod assist Gait Distance (Feet): 4 Feet (+ 10 ft + 15 ft) Assistive device: 1 person hand held assist Gait Pattern/deviations: Step-to pattern, Decreased step length - left, Knee flexed in stance - right, Knee flexed in stance - left, Knees buckling, Shuffle, Ataxic, Scissoring Gait velocity: decreased     General Gait Details: pt with improved activity tolerance and improved mobility with straight ambulation (rather than pivotal steps) needs mod-maxA due to instability and cues to avoid obstacles   Modified Rankin (Stroke Patients Only) Modified Rankin (Stroke Patients Only) Pre-Morbid Rankin Score: No significant disability Modified Rankin: Moderately severe disability  Balance Overall balance assessment: Needs assistance Sitting-balance  support: Feet supported Sitting balance-Leahy Scale: Fair Sitting balance - Comments: No LOB noted when sitting EOB and working on donning gripper socks.   Standing balance support: Single extremity supported, During functional activity Standing balance-Leahy Scale: Poor Standing balance comment: dependent on UE support and maxA                            Cognition Arousal/Alertness: Awake/alert Behavior During Therapy: Flat affect Overall Cognitive Status: Impaired/Different from baseline Area of Impairment: Attention, Awareness, Problem solving, Memory                   Current Attention Level: Sustained Memory: Decreased recall of precautions, Decreased short-term memory     Awareness: Intellectual Problem Solving: Slow processing, Difficulty sequencing, Requires verbal cues, Requires tactile cues General Comments: pt with good orientation and able to answer questions about schedule. improved command following        Exercises      General Comments General comments (skin integrity, edema, etc.): VSS on RA      Pertinent Vitals/Pain Pain Assessment Pain Assessment: Faces Faces Pain Scale: Hurts little more Pain Location: right shoulder Pain Descriptors / Indicators: Discomfort Pain Intervention(s): Limited activity within patient's tolerance, Monitored during session, Repositioned     PT Goals (current goals can now be found in the care plan section) Acute Rehab PT Goals Patient Stated Goal: to return home and to work PT Goal Formulation: With patient Time For Goal Achievement: 10/13/22 Potential to Achieve Goals: Good Progress towards PT goals: Progressing toward goals    Frequency    Min 4X/week      PT Plan Current plan remains appropriate       AM-PAC PT "6 Clicks" Mobility   Outcome Measure  Help needed turning from your back to your side while in a flat bed without using bedrails?: A Little Help needed moving from lying on  your back to sitting on the side of a flat bed without using bedrails?: A Little Help needed moving to and from a bed to a chair (including a wheelchair)?: A Lot Help needed standing up from a chair using your arms (e.g., wheelchair or bedside chair)?: A Lot Help needed to walk in hospital room?: A Lot Help needed climbing 3-5 steps with a railing? : Total 6 Click Score: 13    End of Session Equipment Utilized During Treatment: Gait belt Activity Tolerance: Patient tolerated treatment well Patient left: with call bell/phone within reach;in bed;with bed alarm set Nurse Communication: Mobility status PT Visit Diagnosis: Other abnormalities of gait and mobility (R26.89);Unsteadiness on feet (R26.81);Muscle weakness (generalized) (M62.81);Ataxic gait (R26.0)     Time: 1232-1300 PT Time Calculation (min) (ACUTE ONLY): 28 min  Charges:  $Gait Training: 8-22 mins $Therapeutic Exercise: 8-22 mins                     Vickki Muff, PT, DPT   Acute Rehabilitation Department Office [phone] Secure Chat Communication Preferred   Ronnie Derby 09/30/2022, 2:26 PM

## 2022-09-30 NOTE — Transfer of Care (Signed)
Immediate Anesthesia Transfer of Care Note  Patient: Richard Andrews  Procedure(s) Performed: IR WITH ANESTHESIA MMA EMBOLIZATION  Patient Location: PACU  Anesthesia Type:General  Level of Consciousness: awake and alert   Airway & Oxygen Therapy: Patient Spontanous Breathing and Patient connected to nasal cannula oxygen  Post-op Assessment: Report given to RN, Post -op Vital signs reviewed and stable, and Patient moving all extremities  Post vital signs: stable  Last Vitals:  Vitals Value Taken Time  BP 115/79 09/30/22 2136  Temp    Pulse 69 09/30/22 2137  Resp 15 09/30/22 2137  SpO2 98 % 09/30/22 2137  Vitals shown include unvalidated device data.  Last Pain:  Vitals:   09/30/22 1834  TempSrc:   PainSc: 0-No pain      Patients Stated Pain Goal: 0 (09/28/22 0056)  Complications: No notable events documented.

## 2022-09-30 NOTE — Anesthesia Postprocedure Evaluation (Signed)
Anesthesia Post Note  Patient: KHALIFA KNECHT  Procedure(s) Performed: IR WITH ANESTHESIA MMA EMBOLIZATION     Patient location during evaluation: PACU Anesthesia Type: General Level of consciousness: awake and alert Pain management: pain level controlled Vital Signs Assessment: post-procedure vital signs reviewed and stable Respiratory status: spontaneous breathing, nonlabored ventilation, respiratory function stable and patient connected to nasal cannula oxygen Cardiovascular status: blood pressure returned to baseline and stable Postop Assessment: no apparent nausea or vomiting Anesthetic complications: no  No notable events documented.  Last Vitals:  Vitals:   09/30/22 2215 09/30/22 2226  BP: (!) 154/89 (!) 155/84  Pulse: 68 64  Resp: 14 15  Temp: 36.4 C 36.4 C  SpO2: 96% 98%    Last Pain:  Vitals:   09/30/22 2226  TempSrc: Oral  PainSc:                  Kennieth Rad

## 2022-09-30 NOTE — Progress Notes (Signed)
   Trauma/Critical Care Follow Up Note  Subjective:    Overnight Issues:  No new issues.  Voiding well, pain controlled.  A&Ox4. No BM. Objective:  Vital signs for last 24 hours: Temp:  [97.9 F (36.6 C)-98.9 F (37.2 C)] 98 F (36.7 C) (04/19 0748) Pulse Rate:  [64-80] 64 (04/19 0748) Resp:  [14-17] 14 (04/19 0748) BP: (104-117)/(73-84) 116/73 (04/19 0748) SpO2:  [95 %-99 %] 98 % (04/19 0748) Weight:  [66.3 kg] 66.3 kg (04/18 1359)   Intake/Output from previous day: 04/18 0701 - 04/19 0700 In: 800 [P.O.:800] Out: 600 [Urine:600]  Intake/Output this shift: Total I/O In: -  Out: 500 [Urine:500]  Vent settings for last 24 hours:    Physical Exam:  Gen: comfortable, no distress Neuro: follows commands, alert, communicative, flat with slow response but oriented HEENT: PERRL Neck: supple CV: RRR Pulm: unlabored breathing on RA Abd: soft, NT    GU: urine clear but slightly orange Extr: wwp, dressing in place over right humeral head area  No results found for this or any previous visit (from the past 24 hour(s)).   Assessment & Plan:  Present on Admission:  Fall    LOS: 6 days   Additional comments:I reviewed the patient's new clinical lab test results.   and I reviewed the patients new imaging test results.    Fall SDH with slight shift, subacute - NSGY c/s, Dr. Syliva Overman, plans MMA embolization today 4/19. Repeat CTH 4/14 stable.  R comminuted humeral head fx - ORIF with Dr. Dion Saucier 4/16. Sling. NWB RUE, ok for AROM at elbow, wrist, and fingers EtOH abuse - CIWA, monitor Thrombocytopenia - monitor, plt 130 today FEN - reg diet, NPO after 0900am 4/19 for NSGY procedure, add daily miralax and prn suppository for bowel regimen, already on colace DVT - SCDs, LMWH, will d/w NSGY today prior to procedure tomorrow Dispo - 4NP, PT/OT/SLP, likely CIR at DC MMA embolization today    Adam Phenix, PA-C Trauma & General Surgery Please use [email or web address]  to contact on call provider  09/30/2022  *Care during the described time interval was provided by me. I have reviewed this patient's available data, including medical history, events of note, physical examination and test results as part of my evaluation.

## 2022-09-30 NOTE — Anesthesia Procedure Notes (Signed)
Procedure Name: Intubation Date/Time: 09/30/2022 7:40 PM  Performed by: Salli Bodin T, CRNAPre-anesthesia Checklist: Patient identified, Emergency Drugs available, Suction available and Patient being monitored Patient Re-evaluated:Patient Re-evaluated prior to induction Oxygen Delivery Method: Circle system utilized Preoxygenation: Pre-oxygenation with 100% oxygen Induction Type: IV induction Ventilation: Mask ventilation without difficulty Laryngoscope Size: Miller and 1 Grade View: Grade II Tube type: Oral Tube size: 7.5 mm Number of attempts: 1 Airway Equipment and Method: Stylet and Oral airway Placement Confirmation: ETT inserted through vocal cords under direct vision, positive ETCO2 and breath sounds checked- equal and bilateral Secured at: 22 cm Tube secured with: Tape Dental Injury: Teeth and Oropharynx as per pre-operative assessment

## 2022-09-30 NOTE — Anesthesia Preprocedure Evaluation (Signed)
Anesthesia Evaluation  Patient identified by MRN, date of birth, ID band Patient awake    Reviewed: Allergy & Precautions, NPO status , Patient's Chart, lab work & pertinent test results  Airway Mallampati: III  TM Distance: >3 FB Neck ROM: Full    Dental  (+) Dental Advisory Given   Pulmonary Current Smoker and Patient abstained from smoking.   breath sounds clear to auscultation       Cardiovascular hypertension, Pt. on medications  Rhythm:Regular Rate:Normal     Neuro/Psych Seizures -,  SDH    GI/Hepatic PUD,,,(+)     substance abuse  alcohol use  Endo/Other  negative endocrine ROS    Renal/GU negative Renal ROS     Musculoskeletal   Abdominal   Peds  Hematology  (+) Blood dyscrasia, anemia   Anesthesia Other Findings   Reproductive/Obstetrics                             Anesthesia Physical Anesthesia Plan  ASA: 3  Anesthesia Plan: General   Post-op Pain Management: Minimal or no pain anticipated   Induction: Intravenous  PONV Risk Score and Plan: 1 and Dexamethasone, Ondansetron and Treatment may vary due to age or medical condition  Airway Management Planned: Oral ETT  Additional Equipment:   Intra-op Plan:   Post-operative Plan: Extubation in OR  Informed Consent: I have reviewed the patients History and Physical, chart, labs and discussed the procedure including the risks, benefits and alternatives for the proposed anesthesia with the patient or authorized representative who has indicated his/her understanding and acceptance.     Dental advisory given  Plan Discussed with: CRNA  Anesthesia Plan Comments:        Anesthesia Quick Evaluation

## 2022-09-30 NOTE — Op Note (Signed)
  NEUROSURGERY BRIEF OPERATIVE  NOTE   PREOP DX: Right chronic SDH  POSTOP DX: Same  PROCEDURE: Onyx embolization of right middle meningeal artery  SURGEON: Dr. Lisbeth Renshaw, MD  ANESTHESIA: GETA  APPROACH: Right trans-femoral  EBL: Minimal  SPECIMENS: None  COMPLICATIONS: None  CONDITION: Stable to recovery  FINDINGS (Full report in [facility]): 1. Successful Onyx embolization of right MMA.   Lisbeth Renshaw, MD [facility]

## 2022-09-30 NOTE — Progress Notes (Signed)
Inpatient Rehab Admissions Coordinator:  Pt is not medically ready for potential CIR admission. Pt with pending surgical procedure today. Will continue to follow.   Wolfgang Phoenix, MS, CCC-SLP Admissions Coordinator [phone]

## 2022-10-01 MED ORDER — CHLORDIAZEPOXIDE HCL 5 MG PO CAPS
15.0000 mg | ORAL_CAPSULE | Freq: Three times a day (TID) | ORAL | Status: DC
  Administered 2022-10-01 – 2022-10-02 (×6): 15 mg via ORAL
  Filled 2022-10-01 (×6): qty 3

## 2022-10-01 MED ORDER — MENTHOL 3 MG MT LOZG: 1.0000 | LOZENGE | OROMUCOSAL | Status: DC | PRN

## 2022-10-01 NOTE — Progress Notes (Addendum)
   Trauma/Critical Care Follow Up Note  Subjective:    Overnight Issues:   Objective:  Vital signs for last 24 hours: Temp:  [97.5 F (36.4 C)-97.8 F (36.6 C)] 97.6 F (36.4 C) (04/20 0342) Pulse Rate:  [63-70] 63 (04/20 0342) Resp:  [14-19] 17 (04/20 0342) BP: (115-155)/(70-92) 132/86 (04/20 0342) SpO2:  [96 %-98 %] 98 % (04/20 0342)  Hemodynamic parameters for last 24 hours:    Intake/Output from previous day: 04/19 0701 - 04/20 0700 In: -  Out: 1500 [Urine:1500]  Intake/Output this shift: No intake/output data recorded.  Vent settings for last 24 hours:    Physical Exam:  Gen: comfortable, no distress Neuro: follows commands HEENT: PERRL Neck: supple CV: RRR Pulm: unlabored breathing on RA Abd: soft, NT    GU: urine clear and yellow, +spontaneous voids Extr: wwp, no edema  No results found for this or any previous visit (from the past 24 hour(s)).  Assessment & Plan:  Present on Admission:  Fall    LOS: 7 days   Additional comments:I reviewed the patient's new clinical lab test results.   and I reviewed the patients new imaging test results.     Fall  SDH with slight shift, subacute - NSGY c/s, Dr. Syliva Overman, MMA embolization 4/19. Repeat CTH 4/14 stable.  R comminuted humeral head fx - ORIF with Dr. Dion Saucier 4/16. Sling. NWB RUE, ok for AROM at elbow, wrist, and fingers EtOH abuse - CIWA, monitor, begin librium wean Thrombocytopenia - monitor, plt 130 today FEN - reg diet, add daily miralax and prn suppository for bowel regimen, already on colace DVT - SCDs, LMWH Dispo - 4NP, PT/OT/SLP, CIR at DC  Diamantina Monks, MD Trauma [facility] General Surgery Please use [email or web address] to contact on call provider  10/01/2022  *Care during the described time interval was provided by me. I have reviewed this patient's available data, including medical history, events of note, physical examination and test results as part of my evaluation.

## 2022-10-01 NOTE — Plan of Care (Signed)
  Problem: Education: Goal: Knowledge of General Education information will improve Description: Including pain rating scale, medication(s)/side effects and non-pharmacologic comfort measures Outcome: Progressing   Problem: Health Behavior/Discharge Planning: Goal: Ability to manage health-related needs will improve Outcome: Progressing   Problem: Clinical Measurements: Goal: Ability to maintain clinical measurements within normal limits will improve Outcome: Progressing   Problem: Elimination: Goal: Will not experience complications related to bowel motility Outcome: Progressing Goal: Will not experience complications related to urinary retention Outcome: Progressing   Problem: Pain Managment: Goal: General experience of comfort will improve Outcome: Progressing   Problem: Safety: Goal: Ability to remain free from injury will improve Outcome: Progressing   Problem: Education: Goal: Knowledge of the prescribed therapeutic regimen will improve Outcome: Progressing Goal: Understanding of activity limitations/precautions following surgery will improve Outcome: Progressing Goal: Individualized Educational Video(s) Outcome: Progressing   Problem: Activity: Goal: Ability to tolerate increased activity will improve Outcome: Progressing   Problem: Pain Management: Goal: Pain level will decrease with appropriate interventions Outcome: Progressing   Problem: Education: Goal: Understanding of CV disease, CV risk reduction, and recovery process will improve Outcome: Progressing Goal: Individualized Educational Video(s) Outcome: Progressing   Problem: Activity: Goal: Ability to return to baseline activity level will improve Outcome: Progressing   Problem: Cardiovascular: Goal: Ability to achieve and maintain adequate cardiovascular perfusion will improve Outcome: Progressing Goal: Vascular access site(s) Level 0-1 will be maintained Outcome: Progressing   Problem: Health  Behavior/Discharge Planning: Goal: Ability to safely manage health-related needs after discharge will improve Outcome: Progressing

## 2022-10-01 NOTE — Progress Notes (Signed)
  NEUROSURGERY PROGRESS NOTE   No issues overnight. Pt without HA this am.  EXAM:  BP 127/74   Pulse 63   Temp 97.6 F (36.4 C) (Oral)   Resp 14   Ht 6' (1.829 m)   Wt 66.3 kg   SpO2 98%   BMI 19.82 kg/m   Awake, alert, oriented  Speech fluent, appropriate  CN grossly intact  MAE well, right arm in sling Right groin site soft, no hematoma   IMPRESSION:  62 y.o. male POD#1 right MMA embo for chronic SDH. Neurologically stable  PLAN: - Cont supportive care - Will need outpatient f/u for SDH with Dr. Yetta Barre in 3-4 weeks   Lisbeth Renshaw, MD [facility]

## 2022-10-02 NOTE — Progress Notes (Signed)
   Trauma/Critical Care Follow Up Note  Subjective:    Overnight Issues:  No acute events.    Objective:  Vital signs for last 24 hours: Temp:  [97.6 F (36.4 C)] 97.6 F (36.4 C) (04/20 2304) Pulse Rate:  [52-70] 52 (04/21 0600) Resp:  [10-19] 11 (04/21 0600) BP: (92-124)/(68-91) 92/68 (04/21 0322) SpO2:  [93 %-98 %] 96 % (04/21 0600)   Intake/Output from previous day: 04/20 0701 - 04/21 0700 In: -  Out: 550 [Urine:550]   Intake/Output this shift: No intake/output data recorded.  Physical Exam:    Gen: comfortable, no distress Neuro: follows commands HEENT: PERRL Neck: supple CV: RRR Pulm: unlabored breathing on RA Abd: soft, NT   Extr: wwp, no edema, +2 DP/PT pulses  No results found for this or any previous visit (from the past 24 hour(s)).  Assessment & Plan:  Present on Admission:  Fall    LOS: 8 days   Additional comments:I reviewed the patient's new clinical lab test results.   and I reviewed the patients new imaging test results.     Fall  SDH with slight shift, subacute - NSGY c/s, Dr. Syliva Overman, MMA embolization 4/19. Repeat CTH 4/14 stable.  R comminuted humeral head fx - ORIF with Dr. Dion Saucier 4/16. Sling. NWB RUE, ok for AROM at elbow, wrist, and fingers EtOH abuse - CIWA, monitor, librium wean. Will d/w trauma team tomorrow whether this is to stay at 15 TID or decrease further.   Thrombocytopenia - monitor, plt 130 at last check. Recheck tomorrow.  FEN - reg diet, daily miralax and prn suppository for bowel regimen, already on colace. Hyponatremia at last BMET, recheck tomorrow.   DVT - SCDs, LMWH Dispo - 4NP, PT/OT/SLP, awaiting CIR for DC  Maudry Diego, MD FACS Surgical Oncology, General Surgery, Trauma and Critical [facility], [place] [phone] for weekday/non holidays Check [email or web address] for coverage night/weekend/holidays  10/02/2022  *Care during the described time interval was provided by me. I have  reviewed this patient's available data, including medical history, events of note, physical examination and test results as part of my evaluation.

## 2022-10-02 NOTE — Progress Notes (Signed)
Mobility Specialist Progress Note   10/02/22 1300  Mobility  Activity Transferred from bed to chair;Ambulated with assistance in room  Level of Assistance Moderate assist, patient does 50-74%  Assistive Device Front wheel walker  Distance Ambulated (ft) 10 ft  Range of Motion/Exercises Active;All extremities  RUE Weight Bearing NWB  Activity Response Tolerated well   Patient received in supine and agreeable to participate. Completed bed mobility with supervision and required min-mod A  sit>stand. Required constant cues to maintain NWB precautions on RUE. Took steps with short shuffle like gait to recliner chair with mod A for balance and safety. Noted LLE weakness and reduced foot clearance. Tolerated without complaint or incident. Was left in recliner with all needs met, call bell in reach.    Richard Andrews, BS EXP Mobility Specialist Please contact via SecureChat or Rehab office at [phone]

## 2022-10-02 NOTE — Progress Notes (Signed)
   Providing Compassionate, Quality Care - Together   Subjective: Patient reports no new issues. He would like to work with therapies.  Objective: Vital signs in last 24 hours: Temp:  [97.6 F (36.4 C)] 97.6 F (36.4 C) (04/20 2304) Pulse Rate:  [52-70] 52 (04/21 0600) Resp:  [10-19] 11 (04/21 0600) BP: (92-124)/(68-91) 92/68 (04/21 0322) SpO2:  [93 %-98 %] 96 % (04/21 0600)  Intake/Output from previous day: 04/20 0701 - 04/21 0700 In: -  Out: 550 [Urine:550] Intake/Output this shift: No intake/output data recorded.  Alert and oriented x 4 Flat affect PERRLA MAE, Strength and sensation intact, aside from RUE that is immobilized in a sling Right groin site is clean, dry, and intact, without signs of a hematoma   Lab Results: No results for input(s): "WBC", "HGB", "HCT", "PLT" in the last 72 hours. BMET No results for input(s): "NA", "K", "CL", "CO2", "GLUCOSE", "BUN", "CREATININE", "CALCIUM" in the last 72 hours.  Studies/Results: No results found.  Assessment/Plan: Patient underwent MMA embolization by Dr. Conchita Paris on 09/26/2022 and ORIF of his right proximal humerus fracture by Dr. Dion Saucier on 09/27/2022. The patient is progressing well.   LOS: 8 days   -Mobilize with therapies. -Continue supportive efforts.  Val Eagle, DNP, AGNP-C Nurse Practitioner  [facility] [address], [address], [place], [name] [address] P: [phone]    F: [phone]  10/02/2022, 11:00 AM

## 2022-10-03 ENCOUNTER — Encounter (HOSPITAL_COMMUNITY): Payer: Self-pay | Admitting: Radiology

## 2022-10-03 LAB — CBC
HCT: 28.6 % — ABNORMAL LOW (ref 39.0–52.0)
Hemoglobin: 10 g/dL — ABNORMAL LOW (ref 13.0–17.0)
MCH: 36.1 pg — ABNORMAL HIGH (ref 26.0–34.0)
MCHC: 35 g/dL (ref 30.0–36.0)
MCV: 103.2 fL — ABNORMAL HIGH (ref 80.0–100.0)
Platelets: 174 10*3/uL (ref 150–400)
RBC: 2.77 MIL/uL — ABNORMAL LOW (ref 4.22–5.81)
RDW: 12.5 % (ref 11.5–15.5)
WBC: 4.1 10*3/uL (ref 4.0–10.5)
nRBC: 0 % (ref 0.0–0.2)

## 2022-10-03 LAB — BASIC METABOLIC PANEL
Anion gap: 11 (ref 5–15)
BUN: 15 mg/dL (ref 8–23)
CO2: 25 mmol/L (ref 22–32)
Calcium: 9.1 mg/dL (ref 8.9–10.3)
Chloride: 100 mmol/L (ref 98–111)
Creatinine, Ser: 0.85 mg/dL (ref 0.61–1.24)
GFR, Estimated: >60 mL/min (ref >60)
Glucose, Bld: 95 mg/dL (ref 70–99)
Potassium: 3.6 mmol/L (ref 3.5–5.1)
Sodium: 136 mmol/L (ref 135–145)

## 2022-10-03 MED ORDER — CHLORDIAZEPOXIDE HCL 5 MG PO CAPS
10.0000 mg | ORAL_CAPSULE | Freq: Three times a day (TID) | ORAL | Status: AC
  Administered 2022-10-03 – 2022-10-05 (×9): 10 mg via ORAL
  Filled 2022-10-03 (×9): qty 2

## 2022-10-03 MED ORDER — ENOXAPARIN SODIUM 30 MG/0.3ML IJ SOSY
30.0000 mg | PREFILLED_SYRINGE | Freq: Two times a day (BID) | INTRAMUSCULAR | Status: DC
  Administered 2022-10-03 – 2022-10-07 (×9): 30 mg via SUBCUTANEOUS
  Filled 2022-10-03 (×9): qty 0.3

## 2022-10-03 NOTE — Progress Notes (Signed)
Mobility Specialist Progress Note   10/03/22 1435  Mobility  Activity Transferred from chair to bed  Level of Assistance Minimal assist, patient does 75% or more  Assistive Device Front wheel walker  Distance Ambulated (ft) 5 ft  Range of Motion/Exercises Active;All extremities  RUE Weight Bearing NWB  Activity Response Tolerated well   Patient received in recliner requesting assistance back to bed. Prior to getting up, gown was changed due to being soiled with melted ice cream. Required min A to stand and take steps to bed. Also needed mod A for walker management and navigation. Returned to supine without complaint or incident. Was left with all needs met, call bell in reach.   Richard Andrews, BS EXP Mobility Specialist Please contact via SecureChat or Rehab office at [phone]

## 2022-10-03 NOTE — Progress Notes (Signed)
Patient ID: Richard Andrews, male   DOB: 15-Jun-1960, 62 y.o.   MRN: [id number] 3 Days Post-Op    Subjective: Expresses that he knows CIR has been recommended. Reports his daughter is a Engineer, civil (consulting). He does not live with her. He is worried about missing work/losing his job. ROS negative except as listed above. Objective: Vital signs in last 24 hours: Temp:  [97.6 F (36.4 C)-98.7 F (37.1 C)] 98 F (36.7 C) (04/22 0733) Pulse Rate:  [65-70] 69 (04/22 0733) Resp:  [10-12] 12 (04/22 0810) BP: (92-130)/(71-89) 109/84 (04/22 0733) SpO2:  [92 %-100 %] 92 % (04/22 0733) Last BM Date :  (PTA)  Intake/Output from previous day: 04/21 0701 - 04/22 0700 In: -  Out: 1200 [Urine:1200] Intake/Output this shift: Total I/O In: 480 [P.O.:480] Out: -   General appearance: cooperative Resp: clear to auscultation bilaterally Cardio: regular rate and rhythm GI: soft, NT Extremities: sling RUE, calves soft Neuro - calm, MAE  Lab Results: CBC  Recent Labs    10/03/22 0616  WBC 4.1  HGB 10.0*  HCT 28.6*  PLT 174   BMET Recent Labs    10/03/22 0616  NA 136  K 3.6  CL 100  CO2 25  GLUCOSE 95  BUN 15  CREATININE 0.85  CALCIUM 9.1   PT/INR No results for input(s): "LABPROT", "INR" in the last 72 hours. ABG No results for input(s): "PHART", "HCO3" in the last 72 hours.  Invalid input(s): "PCO2", "PO2"  Studies/Results: No results found.  Anti-infectives: Anti-infectives (From admission, onward)    Start     Dose/Rate Route Frequency Ordered Stop   09/27/22 2100  ceFAZolin (ANCEF) IVPB 2g/100 mL premix        2 g 200 mL/hr over 30 Minutes Intravenous Every 6 hours 09/27/22 2027 09/28/22 0949   09/27/22 0745  ceFAZolin (ANCEF) IVPB 2g/100 mL premix        2 g 200 mL/hr over 30 Minutes Intravenous On call to O.R. 09/27/22 0654 09/27/22 1607       Assessment/Plan: Fall  SDH with slight shift, subacute - NSGY c/s, Dr. Syliva Overman, MMA embolization 4/19. Repeat CTH 4/14  stable. TBI team therapies. R comminuted humeral head fx - ORIF with Dr. Dion Saucier 4/16. Sling. NWB RUE, ok for AROM at elbow, wrist, and fingers EtOH abuse - CIWA, monitor, decrease librium to  TID today  Thrombocytopenia - monitor, plt 174 FEN - reg diet, daily miralax and prn suppository for bowel regimen, already on colace. Hyponatremia improved DVT - SCDs, LMWH Dispo - 4NP, PT/OT/SLP, plan CIR  LOS: 9 days    Richard Gelinas, MD, MPH, FACS Trauma & General Surgery [facility] to contact on call provider  10/03/2022

## 2022-10-03 NOTE — Progress Notes (Signed)
Physical Therapy Treatment Patient Details Name: Richard Andrews MRN: [id number] DOB: March 09, 1961 Today's Date: 10/03/2022   History of Present Illness The pt is a 62 yo male presenting 4/13 after a fall onto R shoulder. +etoh. Imaging showed R proximal humerus fx and R subacute SDH. Pt with additional fall ~1 month ago for which he did not seek medical care, suspect SDH from prior fall. S/p ORIF R proximal humerus 4/16. MMA embolization 4/19. PMH includes: HTN, L wrist fx.    PT Comments    The pt was agreeable to session with focus on progressing gait and stability. He was able to progress ambulation distance, but continues to require mod-maxA to maintain balance in addition to UE support through LUE. He continues to require max cues for safety and technique with transfers and gait, and will continue to benefit from intensive therapies after d/c to regain greater independence and reduce risk of further falls.     Recommendations for follow up therapy are one component of a multi-disciplinary discharge planning process, led by the attending physician.  Recommendations may be updated based on patient status, additional functional criteria and insurance authorization.  Follow Up Recommendations       Assistance Recommended at Discharge Frequent or constant Supervision/Assistance  Patient can return home with the following Two people to help with walking and/or transfers;Two people to help with bathing/dressing/bathroom;Assistance with cooking/housework;Direct supervision/assist for medications management;Direct supervision/assist for financial management;Assist for transportation;Help with stairs or ramp for entrance   Equipment Recommendations  Other (comment) (defer to post acute)    Recommendations for Other Services       Precautions / Restrictions Precautions Precautions: Fall;Shoulder Shoulder Interventions: Shoulder sling/immobilizer;At all times;Off for  dressing/bathing/exercises Precaution Booklet Issued: Yes (comment) Precaution Comments: OK for AROM at elbow, wrist, and digits only Required Braces or Orthoses: Sling Restrictions Weight Bearing Restrictions: Yes RUE Weight Bearing: Non weight bearing     Mobility  Bed Mobility Overal bed mobility: Needs Assistance Bed Mobility: Supine to Sit     Supine to sit: Min guard     General bed mobility comments: pt able to rise to sitting EOB with use of bed rail but no assist from therapist. cues for technique    Transfers Overall transfer level: Needs assistance Equipment used: 1 person hand held assist Transfers: Sit to/from Stand Sit to Stand: Mod assist, Min assist           General transfer comment: modA initially to rise to standing, improved to minA within session. pt benefits from increased static stance to get balanced and cues to extend at knees. increased cues for safety and technique with approaching chair to sit, pt sitting early and at great risk for falls    Ambulation/Gait Ambulation/Gait assistance: Max assist, Mod assist Gait Distance (Feet): 45 Feet Assistive device: 1 person hand held assist Gait Pattern/deviations: Step-to pattern, Decreased step length - left, Knee flexed in stance - right, Knee flexed in stance - left, Knees buckling, Shuffle, Ataxic, Scissoring Gait velocity: decreased     General Gait Details: pt continues to need UE support in LUE and mod-maxA to correct LOB at trunk with gait, but was able to progress distance prior to feeling fatigued today. pt maintains knees flexed slightly and uses short, shuffling steps with intermittent scissoring    Modified Rankin (Stroke Patients Only) Modified Rankin (Stroke Patients Only) Pre-Morbid Rankin Score: No significant disability Modified Rankin: Moderately severe disability     Balance Overall balance assessment: Needs assistance  Sitting-balance support: Feet supported Sitting  balance-Leahy Scale: Fair Sitting balance - Comments: No LOB noted when sitting EOB and working on donning gripper socks.   Standing balance support: Single extremity supported, During functional activity Standing balance-Leahy Scale: Poor Standing balance comment: dependent on UE support and maxA                            Cognition Arousal/Alertness: Awake/alert Behavior During Therapy: Flat affect Overall Cognitive Status: Impaired/Different from baseline Area of Impairment: Attention, Awareness, Problem solving, Memory                   Current Attention Level: Sustained Memory: Decreased recall of precautions, Decreased short-term memory     Awareness: Intellectual Problem Solving: Slow processing, Difficulty sequencing, Requires verbal cues, Requires tactile cues General Comments: pt with flat affect but demos good memory of prior sessions. oriented but benefits from cues for initiation and safety        Exercises      General Comments General comments (skin integrity, edema, etc.): VSS on RA      Pertinent Vitals/Pain Pain Assessment Pain Assessment: Faces Faces Pain Scale: Hurts a little bit Pain Location: right shoulder Pain Descriptors / Indicators: Discomfort Pain Intervention(s): Limited activity within patient's tolerance, Monitored during session, Repositioned     PT Goals (current goals can now be found in the care plan section) Acute Rehab PT Goals Patient Stated Goal: to return home and to work PT Goal Formulation: With patient Time For Goal Achievement: 10/13/22 Potential to Achieve Goals: Good Progress towards PT goals: Progressing toward goals    Frequency    Min 4X/week      PT Plan Current plan remains appropriate       AM-PAC PT "6 Clicks" Mobility   Outcome Measure  Help needed turning from your back to your side while in a flat bed without using bedrails?: A Little Help needed moving from lying on your back to  sitting on the side of a flat bed without using bedrails?: A Little Help needed moving to and from a bed to a chair (including a wheelchair)?: A Lot Help needed standing up from a chair using your arms (e.g., wheelchair or bedside chair)?: A Lot Help needed to walk in hospital room?: A Lot Help needed climbing 3-5 steps with a railing? : Total 6 Click Score: 13    End of Session Equipment Utilized During Treatment: Gait belt Activity Tolerance: Patient tolerated treatment well Patient left: with call bell/phone within reach;in chair;with chair alarm set Nurse Communication: Mobility status PT Visit Diagnosis: Other abnormalities of gait and mobility (R26.89);Unsteadiness on feet (R26.81);Muscle weakness (generalized) (M62.81);Ataxic gait (R26.0)     Time: 1610-9604 PT Time Calculation (min) (ACUTE ONLY): 18 min  Charges:  $Gait Training: 8-22 mins                     Vickki Muff, PT, DPT   Acute Rehabilitation Department Office [phone] Secure Chat Communication Preferred   Ronnie Derby 10/03/2022, 11:24 AM

## 2022-10-03 NOTE — Progress Notes (Signed)
Occupational Therapy Treatment Patient Details Name: Richard Andrews MRN: [id number] DOB: 21-Sep-1960 Today's Date: 10/03/2022   History of present illness The pt is a 62 yo male presenting 4/13 after a fall onto R shoulder. +etoh. Imaging showed R proximal humerus fx and R subacute SDH. Pt with additional fall ~1 month ago for which he did not seek medical care, suspect SDH from prior fall. S/p ORIF R proximal humerus 4/16. MMA embolization 4/19. PMH includes: HTN, L wrist fx.   OT comments  Pt currently mod assist for bathing sit to stand with max assist for dressing following AROM precautions with the RUE.  Sit to stand with min assist for LB selfcare with min to mod to maintain standing balance secondary to posterior bias.  Pt with better movement in the right elbow and hand this session compared to initial visit last week.  Recommend continued acute care OT at this time to continue progression with ADLs.     Recommendations for follow up therapy are one component of a multi-disciplinary discharge planning process, led by the attending physician.  Recommendations may be updated based on patient status, additional functional criteria and insurance authorization.    Assistance Recommended at Discharge Frequent or constant Supervision/Assistance  Patient can return home with the following  A lot of help with bathing/dressing/bathroom;Assist for transportation;Help with stairs or ramp for entrance;Direct supervision/assist for medications management;Assistance with cooking/housework;Direct supervision/assist for financial management;A little help with walking and/or transfers   Equipment Recommendations  Other (comment) (TBD next venue of care)    Recommendations for Other Services Rehab consult    Precautions / Restrictions Precautions Precautions: Fall;Shoulder Shoulder Interventions: Shoulder sling/immobilizer;At all times;Off for dressing/bathing/exercises Precaution Booklet Issued: Yes  (comment) Precaution Comments: OK for AROM at elbow, wrist, and digits only Required Braces or Orthoses: Sling Restrictions Weight Bearing Restrictions: Yes RUE Weight Bearing: Non weight bearing       Mobility Bed Mobility                    Transfers Overall transfer level: Needs assistance Equipment used: 1 person hand held assist   Sit to Stand: Min assist (from recliner)           General transfer comment: Mod instructional cueing to avoid trying to use the RUE to assist with sit to stand or balance once standing during LB bathing.     Balance Overall balance assessment: Needs assistance Sitting-balance support: Feet supported Sitting balance-Leahy Scale: Fair       Standing balance-Leahy Scale: Poor Standing balance comment: Pt needs UE support for mobility.                           ADL either performed or assessed with clinical judgement   ADL Overall ADL's : Needs assistance/impaired     Grooming: Wash/dry face;Set up;Sitting   Upper Body Bathing: Moderate assistance;Sitting Upper Body Bathing Details (indicate cue type and reason): following AROM restrictions Lower Body Bathing: Moderate assistance;Sit to/from stand   Upper Body Dressing : Total assistance;Sitting Upper Body Dressing Details (indicate cue type and reason): sling and new hospital gown Lower Body Dressing: Moderate assistance;Sit to/from stand Lower Body Dressing Details (indicate cue type and reason): gripper socks             Functional mobility during ADLs: Minimal assistance (sit to stand from the EOB during ADL) General ADL Comments: Pt completed selfcare tasks sit to stand from the recliner.  Max assist for doffing and donning sling with mod demonstrational cueing needed to keep from integrating the RUE too much for bathing, incorporating active shoulder movements that are not allowed currently.  Had him complete 1 set of 10 reps for elbow flexion/extension  prior to donning sling after bathing.  Vitals stable throughout session.      Cognition Arousal/Alertness: Awake/alert Behavior During Therapy: WFL for tasks assessed/performed Overall Cognitive Status: Impaired/Different from baseline Area of Impairment: Attention, Memory, Safety/judgement, Awareness, Orientation                 Orientation Level: Time (not able to state day of week but was aware of month) Current Attention Level: Sustained Memory: Decreased recall of precautions, Decreased short-term memory     Awareness: Intellectual Problem Solving: Slow processing, Difficulty sequencing, Requires verbal cues, Requires tactile cues                General Comments VSS on RA    Pertinent Vitals/ Pain       Pain Assessment Pain Assessment: Faces Faces Pain Scale: Hurts a little bit Pain Location: right shoulder Pain Descriptors / Indicators: Discomfort Pain Intervention(s): Limited activity within patient's tolerance, Repositioned         Frequency  Min 2X/week        Progress Toward Goals  OT Goals(current goals can now be found in the care plan section)  Progress towards OT goals: Progressing toward goals  Acute Rehab OT Goals Patient Stated Goal: Pt agreeable to selfcare tasks OT Goal Formulation: With patient Time For Goal Achievement: 10/12/22 Potential to Achieve Goals: Fair  Plan Discharge plan remains appropriate       AM-PAC OT "6 Clicks" Daily Activity     Outcome Measure   Help from another person eating meals?: A Little Help from another person taking care of personal grooming?: A Little Help from another person toileting, which includes using toliet, bedpan, or urinal?: A Lot Help from another person bathing (including washing, rinsing, drying)?: A Lot Help from another person to put on and taking off regular upper body clothing?: A Lot Help from another person to put on and taking off regular lower body clothing?: A Lot 6 Click  Score: 14    End of Session Equipment Utilized During Treatment:  (sling)  OT Visit Diagnosis: Unsteadiness on feet (R26.81);Other abnormalities of gait and mobility (R26.89);Repeated falls (R29.6);Muscle weakness (generalized) (M62.81);Pain;Other symptoms and signs involving cognitive function Pain - Right/Left: Right Pain - part of body: Shoulder   Activity Tolerance Patient tolerated treatment well   Patient Left in chair;with call bell/phone within reach;with chair alarm set   Nurse Communication Mobility status        Time: 1308-6578 OT Time Calculation (min): 37 min  Charges: OT General Charges $OT Visit: 1 Visit OT Treatments $Self Care/Home Management : 23-37 mins   Perrin Maltese, OTR/L Acute Rehabilitation Services  Office [phone] 10/03/2022

## 2022-10-04 HISTORY — PX: IR NEURO EACH ADD'L AFTER BASIC UNI RIGHT (MS): IMG5374

## 2022-10-04 HISTORY — PX: IR ANGIO EXTERNAL CAROTID SEL EXT CAROTID UNI R MOD SED: IMG5371

## 2022-10-04 MED ORDER — THIAMINE MONONITRATE 100 MG PO TABS
100.0000 mg | ORAL_TABLET | Freq: Every day | ORAL | Status: DC
  Administered 2022-10-05 – 2022-10-07 (×3): 100 mg via ORAL
  Filled 2022-10-04 (×3): qty 1

## 2022-10-04 MED ORDER — PANTOPRAZOLE SODIUM 40 MG PO TBEC
40.0000 mg | DELAYED_RELEASE_TABLET | Freq: Every day | ORAL | Status: DC
  Administered 2022-10-05 – 2022-10-07 (×3): 40 mg via ORAL
  Filled 2022-10-04 (×3): qty 1

## 2022-10-04 MED ORDER — MAGNESIUM HYDROXIDE 400 MG/5ML PO SUSP
15.0000 mL | Freq: Once | ORAL | Status: AC
  Administered 2022-10-04: 15 mL via ORAL
  Filled 2022-10-04: qty 30

## 2022-10-04 NOTE — Progress Notes (Signed)
Occupational Therapy Treatment Patient Details Name: Richard Andrews MRN: [id number] DOB: October 06, 1960 Today's Date: 10/04/2022   History of present illness The pt is a 62 yo male presenting 4/13 after a fall onto R shoulder. +etoh. Imaging showed R proximal humerus fx and R subacute SDH. Pt with additional fall ~1 month ago for which he did not seek medical care, suspect SDH from prior fall. S/p ORIF R proximal humerus 4/16. MMA embolization 4/19. PMH includes: HTN, L wrist fx.   OT comments  Pt currently still needing mod to max assist for sit to stand, stand pivot transfers, and selfcare tasks.  Continues to exhibit decreased awareness of his precautions with cueing to not move the RUE at the shoulder.  Recommend continued acute care OT at this time.      Recommendations for follow up therapy are one component of a multi-disciplinary discharge planning process, led by the attending physician.  Recommendations may be updated based on patient status, additional functional criteria and insurance authorization.    Assistance Recommended at Discharge Frequent or constant Supervision/Assistance  Patient can return home with the following  A lot of help with bathing/dressing/bathroom;Assist for transportation;Help with stairs or ramp for entrance;Direct supervision/assist for medications management;Assistance with cooking/housework;Direct supervision/assist for financial management;A little help with walking and/or transfers   Equipment Recommendations  Other (comment) (TBD next venue of care)       Precautions / Restrictions Precautions Precautions: Fall;Shoulder Shoulder Interventions: Shoulder sling/immobilizer;At all times;Off for dressing/bathing/exercises Precaution Booklet Issued: Yes (comment) Precaution Comments: OK for AROM at elbow, wrist, and digits only Required Braces or Orthoses: Sling Restrictions Weight Bearing Restrictions: Yes RUE Weight Bearing: Non weight bearing        Mobility Bed Mobility                    Transfers Overall transfer level: Needs assistance Equipment used: None Transfers: Sit to/from Stand Sit to Stand: Mod assist     Step pivot transfers: Max assist     General transfer comment: Wide BOS in standing with posterior lean and LOB.  Decreased efficient weightshift side to side for stepping, pt with small choppy steps.     Balance Overall balance assessment: Needs assistance Sitting-balance support: Feet supported Sitting balance-Leahy Scale: Fair     Standing balance support: Single extremity supported, During functional activity Standing balance-Leahy Scale: Poor Standing balance comment: Posterior lean and LOB                           ADL either performed or assessed with clinical judgement   ADL Overall ADL's : Needs assistance/impaired                 Upper Body Dressing : Maximal assistance;Sitting Upper Body Dressing Details (indicate cue type and reason): to doff and donn sling     Toilet Transfer: Maximal assistance;Stand-pivot;BSC/3in1   Toileting- Clothing Manipulation and Hygiene: Moderate assistance Toileting - Clothing Manipulation Details (indicate cue type and reason): simulated sit to stand     Functional mobility during ADLs: Maximal assistance (stand pivot without use of RW) General ADL Comments: Pt needed max demonstrational cueing for sequencing and completion of donning and doffing his sling.  Practiced sit to stand, standing balance, and completion of step pivot transfer to the [facility].  Max assist needed for static standing balance secondary to moderate posterior lean.      Cognition Arousal/Alertness: Awake/alert Behavior During Therapy: Flat affect  Overall Cognitive Status: Impaired/Different from baseline Area of Impairment: Attention, Memory, Safety/judgement, Awareness, Orientation                 Orientation Level: Time (stated it was Wednesday instead  of Tuesday) Current Attention Level: Sustained Memory: Decreased recall of precautions, Decreased short-term memory   Safety/Judgement: Decreased awareness of safety, Decreased awareness of deficits Awareness: Intellectual Problem Solving: Difficulty sequencing, Requires verbal cues, Requires tactile cues, Decreased initiation General Comments: Pt unable to recall any precautions related to his shoulder.              General Comments VSS on RA    Pertinent Vitals/ Pain       Pain Assessment Pain Assessment: Faces Faces Pain Scale: Hurts a little bit Pain Location: right shoulder Pain Descriptors / Indicators: Discomfort Pain Intervention(s): Limited activity within patient's tolerance         Frequency  Min 2X/week        Progress Toward Goals  OT Goals(current goals can now be found in the care plan section)  Progress towards OT goals: Progressing toward goals  Acute Rehab OT Goals Patient Stated Goal: Pt did not state this session but agreeable to therapy OT Goal Formulation: With patient Time For Goal Achievement: 10/12/22 Potential to Achieve Goals: Fair  Plan Discharge plan remains appropriate       AM-PAC OT "6 Clicks" Daily Activity     Outcome Measure   Help from another person eating meals?: A Little Help from another person taking care of personal grooming?: A Little Help from another person toileting, which includes using toliet, bedpan, or urinal?: A Lot Help from another person bathing (including washing, rinsing, drying)?: A Lot Help from another person to put on and taking off regular upper body clothing?: A Lot Help from another person to put on and taking off regular lower body clothing?: A Lot 6 Click Score: 14    End of Session Equipment Utilized During Treatment: Gait belt;Other (comment) (sling)  OT Visit Diagnosis: Unsteadiness on feet (R26.81);Other abnormalities of gait and mobility (R26.89);Repeated falls (R29.6);Muscle weakness  (generalized) (M62.81);Pain;Other symptoms and signs involving cognitive function Pain - Right/Left: Right Pain - part of body: Shoulder   Activity Tolerance Patient tolerated treatment well   Patient Left in chair;with call bell/phone within reach;with chair alarm set   Nurse Communication Mobility status        Time: 1610-9604 OT Time Calculation (min): 41 min  Charges: OT General Charges $OT Visit: 1 Visit OT Treatments $Self Care/Home Management : 38-52 mins  Perrin Maltese, OTR/L Acute Rehabilitation Services  Office [phone] 10/04/2022

## 2022-10-04 NOTE — Progress Notes (Signed)
NEUROSURGERY PROGRESS NOTE  Doing well after MMA. No headaches. Will sign off at this point. Follow up in the office once discharged.  Temp:  [97.9 F (36.6 C)-99.3 F (37.4 C)] 98.1 F (36.7 C) (04/23 0806) Pulse Rate:  [60-88] 60 (04/23 0806) Resp:  [11-14] 12 (04/23 0806) BP: (95-136)/(67-84) 103/68 (04/23 0806) SpO2:  [95 %-98 %] 95 % (04/23 0806)   Sherryl Manges, NP 10/04/2022 8:42 AM

## 2022-10-04 NOTE — Progress Notes (Signed)
Physical Therapy Treatment Patient Details Name: Richard Andrews MRN: [id number] DOB: [birth year] Today's Date: 10/04/2022   History of Present Illness The pt is a 62 yo male presenting 4/13 after a fall onto R shoulder. +etoh. Imaging showed R proximal humerus fx and R subacute SDH. Pt with additional fall ~1 month ago for which he did not seek medical care, suspect SDH from prior fall. S/p ORIF R proximal humerus 4/16. MMA embolization 4/19. PMH includes: HTN, L wrist fx.    PT Comments    Pt with increased trunk and neck flexion this date and required increased assist to rise to stand from chair. Focused on on single balance to improve pt's gait pattern as pt with short shuffled steps. Pt's gait pattern improved greatly when PT aided in weight shift at hips. Acute PT to cont to follow. Pt to continue to benefit from intensive rehab program >3 hours a day as pt was indep and working PTA and is very motivated to return to home.    Recommendations for follow up therapy are one component of a multi-disciplinary discharge planning process, led by the attending physician.  Recommendations may be updated based on patient status, additional functional criteria and insurance authorization.  Follow Up Recommendations       Assistance Recommended at Discharge Frequent or constant Supervision/Assistance  Patient can return home with the following Two people to help with walking and/or transfers;Two people to help with bathing/dressing/bathroom;Assistance with cooking/housework;Direct supervision/assist for medications management;Direct supervision/assist for financial management;Assist for transportation;Help with stairs or ramp for entrance   Equipment Recommendations  Other (comment) (defer to post acute)    Recommendations for Other Services Rehab consult     Precautions / Restrictions Precautions Precautions: Fall;Shoulder Shoulder Interventions: Shoulder sling/immobilizer;At all times;Off for  dressing/bathing/exercises Precaution Booklet Issued: Yes (comment) Precaution Comments: OK for AROM at elbow, wrist, and digits only Required Braces or Orthoses: Sling Restrictions Weight Bearing Restrictions: Yes RUE Weight Bearing: Non weight bearing     Mobility  Bed Mobility               General bed mobility comments: pt received sitting inc hair    Transfers Overall transfer level: Needs assistance Equipment used: 1 person hand held assist Transfers: Sit to/from Stand Sit to Stand: Mod assist (from recliner)           General transfer comment: Mod instructional cueing to avoid trying to use the RUE to assist with sit to stand or balance once standing during LB bathing. modA to power up    Ambulation/Gait Ambulation/Gait assistance: Max Chemical engineer (Feet): 100 Feet Assistive device: 1 person hand held assist Gait Pattern/deviations: Step-to pattern, Decreased step length - left, Knee flexed in stance - right, Knee flexed in stance - left, Knees buckling, Shuffle, Ataxic, Scissoring Gait velocity: decreased     General Gait Details: pt with short step height and length, near shuffling. This PT assisted with weight shifting at hips to promote increased single limb stance in which pt then demo'd increased step height and length with improved fluidity. pt with onset of fatigue with LOB anteriorly x2 at end of ambulation   Stairs             Wheelchair Mobility    Modified Rankin (Stroke Patients Only) Modified Rankin (Stroke Patients Only) Pre-Morbid Rankin Score: No significant disability Modified Rankin: Moderately severe disability     Balance Overall balance assessment: Needs assistance Sitting-balance support: Feet supported Sitting balance-Leahy Scale: Fair  Standing balance support: Single extremity supported, During functional activity Standing balance-Leahy Scale: Poor Standing balance comment: Pt needs UE support for  mobility.                            Cognition Arousal/Alertness: Awake/alert Behavior During Therapy: Flat affect Overall Cognitive Status: Impaired/Different from baseline Area of Impairment: Attention, Memory, Safety/judgement, Awareness, Orientation                   Current Attention Level: Sustained Memory: Decreased recall of precautions, Decreased short-term memory     Awareness: Intellectual Problem Solving: Slow processing, Difficulty sequencing, Requires verbal cues, Requires tactile cues General Comments: pt with flat affect but demos good memory of prior sessions. continues to attempt to use R UE even in sling despite multiple cues to not use R UE        Exercises Other Exercises Other Exercises: worked on SLS on L and R LE via standing in front of recliner and holding onto back of chair with L UE Other Exercises: marching in place with L UE support on back of chair x 10 reps    General Comments General comments (skin integrity, edema, etc.): VSS on RA      Pertinent Vitals/Pain Pain Assessment Pain Assessment: 0-10 Pain Score: 5  Pain Location: right shoulder Pain Descriptors / Indicators: Discomfort    Home Living                          Prior Function            PT Goals (current goals can now be found in the care plan section) Acute Rehab PT Goals Patient Stated Goal: to return home and to work PT Goal Formulation: With patient Time For Goal Achievement: 10/13/22 Potential to Achieve Goals: Good Progress towards PT goals: Progressing toward goals    Frequency    Min 4X/week      PT Plan Current plan remains appropriate    Co-evaluation              AM-PAC PT "6 Clicks" Mobility   Outcome Measure  Help needed turning from your back to your side while in a flat bed without using bedrails?: A Little Help needed moving from lying on your back to sitting on the side of a flat bed without using bedrails?:  A Little Help needed moving to and from a bed to a chair (including a wheelchair)?: A Lot Help needed standing up from a chair using your arms (e.g., wheelchair or bedside chair)?: A Lot Help needed to walk in hospital room?: A Lot Help needed climbing 3-5 steps with a railing? : Total 6 Click Score: 13    End of Session Equipment Utilized During Treatment: Gait belt Activity Tolerance: Patient tolerated treatment well Patient left: with call bell/phone within reach;in chair;with chair alarm set Nurse Communication: Mobility status PT Visit Diagnosis: Other abnormalities of gait and mobility (R26.89);Unsteadiness on feet (R26.81);Muscle weakness (generalized) (M62.81);Ataxic gait (R26.0)     Time: 1610-9604 PT Time Calculation (min) (ACUTE ONLY): 33 min  Charges:  $Gait Training: 8-22 mins $Therapeutic Exercise: 8-22 mins                     Lewis Shock, PT, DPT Acute Rehabilitation Services Secure chat preferred Office #: [phone]    Iona Hansen 10/04/2022, 2:16 PM

## 2022-10-04 NOTE — Progress Notes (Signed)
   Trauma/Critical Care Follow Up Note  Subjective:    Overnight Issues:   Objective:  Vital signs for last 24 hours: Temp:  [97.9 F (36.6 C)-99.3 F (37.4 C)] 98.1 F (36.7 C) (04/23 0806) Pulse Rate:  [60-88] 60 (04/23 0806) Resp:  [11-14] 12 (04/23 0806) BP: (95-136)/(67-84) 103/68 (04/23 0806) SpO2:  [95 %-98 %] 95 % (04/23 0806)  Hemodynamic parameters for last 24 hours:    Intake/Output from previous day: 04/22 0701 - 04/23 0700 In: 960 [P.O.:960] Out: 950 [Urine:950]  Intake/Output this shift: No intake/output data recorded.  Vent settings for last 24 hours:    Physical Exam:  Gen: comfortable, no distress Neuro: follows commands, alert, communicative HEENT: PERRL Neck: supple CV: RRR Pulm: unlabored breathing on RA Abd: soft, NT    GU: urine clear and yellow, +spontaneous voids Extr: wwp, no edema  No results found for this or any previous visit (from the past 24 hour(s)).  Assessment & Plan: Present on Admission:  Fall    LOS: 10 days   Additional comments:I reviewed the patient's new clinical lab test results.   and I reviewed the patients new imaging test results.    Fall   SDH with slight shift, subacute - NSGY c/s, Dr. Syliva Overman, MMA embolization 4/19. Repeat CTH 4/14 stable. TBI team therapies. R comminuted humeral head fx - ORIF with Dr. Dion Saucier 4/16. Sling. NWB RUE, ok for AROM at elbow, wrist, and fingers EtOH abuse - CIWA, monitor, decrease librium to  TID today  Thrombocytopenia - monitor, plt 174 FEN - reg diet, daily miralax and prn suppository for bowel regimen, already on colace. Hyponatremia improved DVT - SCDs, LMWH Dispo - 4NP, PT/OT/SLP, plan CIR  Diamantina Monks, MD Trauma & General Surgery Please use [email or web address] to contact on call provider  10/04/2022  *Care during the described time interval was provided by me. I have reviewed this patient's available data, including medical history, events of note,  physical examination and test results as part of my evaluation.

## 2022-10-04 NOTE — Progress Notes (Signed)
Inpatient Rehab Admissions Coordinator:   I continue to await insurance auth for CIR today RN may call report to [phone]  Megan Salon, MS, CCC-SLP Rehab Admissions Coordinator  [phone] (celll) [phone] (office)

## 2022-10-04 NOTE — Progress Notes (Signed)
Speech Language Pathology Treatment: Cognitive-Linquistic  Patient Details Name: Richard Andrews MRN: [id number] DOB: 18-Jun-1960 Today's Date: 10/04/2022 Time: 4098-1191 SLP Time Calculation (min) (ACUTE ONLY): 21 min  Assessment / Plan / Recommendation Clinical Impression  Pt seen for ongoing cognitive-linguistic therapy.  Overall, pt has had significant improvement.  Pt exhibited good attention throughout today's session. Pt with good eye contact.  He participated in therapy tasks.  He was able to converse with therapist and share some details about his day.  He stated that he got some exercise this morning, but it appears PT worked with pt yesterday.  Pt was unable to recall compensatory strategies after introduction, but with repeated review, he was able to recall 1 of 4 strategies and mnemonic.  Pt is unable to recall conversations around his discharge.  He states his ex wife and daughter are helping him manage paperwork at present.  Pt's speech is still dysarthric, but improving.  His speech is low volume and monotone.  Compensatory strategies introduced.  He demonstrated ability to compensate when asked for repetition without specific instruction to speak more loudly.  Pt completed confrontational naming task with 90% accuracy independently and improved to 100% with visual cue.  Pt did not demonstrate word finding difficulty in conversation. He feels that he is at his linguistic baseline.   Recommend continuing intervention for dysarthria and cognition.  SLP will follow in house.  Recommend continued ST at next level of care.    HPI HPI: Richard Andrews is a 62 y.o. male who was brought to the [facility] emergency department after a fall. He was found to have humerus fracture, now s/p ORIF.  Head CT revealed "Large (2.0 cm thick) mixed density right cerebral convexity subdural hemorrhage with hyperdense component posteriorly compatible with acute/recent hemorrhage. Trace leftward midline shift."  This is believed to be from a fall a couple of weeks ago when pt hit head.  Pt now s/p MMA embloziation with neurosurgery. Pt with history of hypertension and alcohol abuse.      SLP Plan  Continue with current plan of care      Recommendations for follow up therapy are one component of a multi-disciplinary discharge planning process, led by the attending physician.  Recommendations may be updated based on patient status, additional functional criteria and insurance authorization.    Recommendations               Intermittent Supervision/Assistance Cognitive communication deficit (R41.841);Dysarthria and anarthria (R47.1);Aphasia (R47.01)     Continue with current plan of care     Kerrie Pleasure, MA, CCC-SLP Acute Rehabilitation Services Office: [phone] 10/04/2022, 12:05 PM

## 2022-10-05 MED ORDER — MAGNESIUM CITRATE PO SOLN
1.0000 | Freq: Once | ORAL | Status: AC
  Administered 2022-10-05: 1 via ORAL
  Filled 2022-10-05: qty 296

## 2022-10-05 MED ORDER — BISACODYL 10 MG RE SUPP
10.0000 mg | Freq: Once | RECTAL | Status: AC
  Administered 2022-10-05: 10 mg via RECTAL
  Filled 2022-10-05: qty 1

## 2022-10-05 MED ORDER — CHLORDIAZEPOXIDE HCL 5 MG PO CAPS
10.0000 mg | ORAL_CAPSULE | Freq: Two times a day (BID) | ORAL | Status: AC
  Administered 2022-10-06 (×2): 10 mg via ORAL
  Filled 2022-10-05 (×2): qty 2

## 2022-10-05 MED ORDER — CHLORDIAZEPOXIDE HCL 5 MG PO CAPS
10.0000 mg | ORAL_CAPSULE | Freq: Every day | ORAL | Status: AC
  Administered 2022-10-07: 10 mg via ORAL
  Filled 2022-10-05: qty 2

## 2022-10-05 MED ORDER — MAGNESIUM HYDROXIDE 400 MG/5ML PO SUSP
30.0000 mL | Freq: Once | ORAL | Status: AC
  Administered 2022-10-05: 30 mL via ORAL
  Filled 2022-10-05: qty 30

## 2022-10-05 NOTE — Progress Notes (Signed)
5 Days Post-Op  Subjective: CC: Stable R sided pain that is well controlled. Tolerating diet without abdominal pain n/v. Passing flatus. Still no bm after milk of mag. Voiding. Working with therapies.   Objective: Vital signs in last 24 hours: Temp:  [97.7 F (36.5 C)-99.4 F (37.4 C)] 98.3 F (36.8 C) (04/24 0746) Pulse Rate:  [61-77] 62 (04/24 0746) Resp:  [10-16] 13 (04/24 0746) BP: (95-117)/(62-81) 109/76 (04/24 0746) SpO2:  [94 %-97 %] 96 % (04/24 0746) Last BM Date :  (PTA)  Intake/Output from previous day: 04/23 0701 - 04/24 0700 In: 120 [P.O.:120] Out: 700 [Urine:700] Intake/Output this shift: No intake/output data recorded.  PE: Gen:  Alert, NAD, pleasant Card:  Reg Pulm:  CTAB, no W/R/R, effort normal Abd: Soft, ND, NT Ext: RUE in sling. No LE edema or calf tenderness. Ext wwp x4 Psych: A&Ox3   Lab Results:  Recent Labs    10/03/22 0616  WBC 4.1  HGB 10.0*  HCT 28.6*  PLT 174   BMET Recent Labs    10/03/22 0616  NA 136  K 3.6  CL 100  CO2 25  GLUCOSE 95  BUN 15  CREATININE 0.85  CALCIUM 9.1   PT/INR No results for input(s): "LABPROT", "INR" in the last 72 hours. CMP     Component Value Date/Time   NA 136 10/03/2022 0616   K 3.6 10/03/2022 0616   CL 100 10/03/2022 0616   CO2 25 10/03/2022 0616   GLUCOSE 95 10/03/2022 0616   BUN 15 10/03/2022 0616   CREATININE 0.85 10/03/2022 0616   CREATININE 0.99 02/01/2021 0000   CALCIUM 9.1 10/03/2022 0616   PROT 7.1 09/25/2022 0207   ALBUMIN 3.6 09/25/2022 0207   AST 38 09/25/2022 0207   ALT 24 09/25/2022 0207   ALKPHOS 67 09/25/2022 0207   BILITOT 1.0 09/25/2022 0207   GFRNONAA >60 10/03/2022 0616   GFRNONAA 90 02/06/2020 0000   GFRAA 104 02/06/2020 0000   Lipase     Component Value Date/Time   LIPASE 49 01/19/2021 0920    Studies/Results: No results found.  Anti-infectives: Anti-infectives (From admission, onward)    Start     Dose/Rate Route Frequency Ordered Stop    09/27/22 2100  ceFAZolin (ANCEF) IVPB 2g/100 mL premix        2 g 200 mL/hr over 30 Minutes Intravenous Every 6 hours 09/27/22 2027 09/28/22 0949   09/27/22 0745  ceFAZolin (ANCEF) IVPB 2g/100 mL premix        2 g 200 mL/hr over 30 Minutes Intravenous On call to O.R. 09/27/22 0654 09/27/22 1607        Assessment/Plan Fall   SDH with slight shift, subacute - NSGY c/s, Dr. Syliva Overman, MMA embolization 4/19. Repeat CTH 4/14 stable. TBI team therapies. R comminuted humeral head fx - ORIF with Dr. Dion Saucier 4/16. Sling. NWB RUE, ok for AROM at elbow, wrist, and fingers EtOH abuse - CIWA, monitor, decrease librium to  TID 4/22 Thrombocytopenia - improved, plt 174 4/22 FEN - reg diet, BID Miralax and senna.  Milk of mag 4/23.  Suppository today. Hyponatremia improved DVT - SCDs, LMWH Dispo - 4NP, PT/OT/SLP. CIR insurance auth pending  I reviewed nursing notes, last 24 h vitals and pain scores, last 48 h intake and output, last 24 h labs and trends, and last 24 h imaging results.   LOS: 11 days    Jacinto Halim , [facility] Surgery 10/05/2022, 9:23 AM Please  see Amion for pager number during day hours 7:00am-4:30pm

## 2022-10-05 NOTE — Progress Notes (Signed)
Inpatient Rehab Admissions Coordinator:    I continue to await insurance auth for CIR. I will follow for admit pending insurance auth.   Megan Salon, MS, CCC-SLP Rehab Admissions Coordinator  [phone] (celll) [phone] (office)

## 2022-10-05 NOTE — Progress Notes (Signed)
Mobility Specialist Progress Note   10/05/22 1535  Mobility  Activity Moved into chair position in bed (Defer OOB. Bed level exercise)  Level of Assistance Standby assist, set-up cues, supervision of patient - no hands on  Assistive Device None  Range of Motion/Exercises Active;All extremities  RUE Weight Bearing NWB  Activity Response Tolerated well   Patient received in supine and agreeable to participate. Completed bed level exercise with supervision. Tolerated  without complaint or incident. Was left in supine with all needs met, call bell in reach.   Richard Andrews, BS EXP Mobility Specialist Please contact via SecureChat or Rehab office at [phone]

## 2022-10-05 NOTE — Progress Notes (Signed)
Physical Therapy Treatment Patient Details Name: Richard Andrews MRN: [id number] DOB: 1960/12/27 Today's Date: 10/05/2022   History of Present Illness The pt is a 62 yo male presenting 4/13 after a fall onto R shoulder. +etoh. Imaging showed R proximal humerus fx and R subacute SDH. Pt with additional fall ~1 month ago for which he did not seek medical care, suspect SDH from prior fall. S/p ORIF R proximal humerus 4/16. MMA embolization 4/19. PMH includes: HTN, L wrist fx.    PT Comments    Patient continues with impaired gait with very wide BOS, very short, guarded steps with poor lateral weight-shift. Initially pt allowed assisted wt-shifting to increase his step length and clearance, however progressively became more resistant to wt-shifting with wider and wider BOS. Patient with posterior bias with gait and with extreme difficulty stepping backwards to align himself with chair prior to sitting. Mod assist to direct hips to chair and control descent as pt began to fall backwards when trying to step back to chair.     Recommendations for follow up therapy are one component of a multi-disciplinary discharge planning process, led by the attending physician.  Recommendations may be updated based on patient status, additional functional criteria and insurance authorization.  Follow Up Recommendations       Assistance Recommended at Discharge Frequent or constant Supervision/Assistance  Patient can return home with the following Two people to help with walking and/or transfers;Two people to help with bathing/dressing/bathroom;Assistance with cooking/housework;Direct supervision/assist for medications management;Direct supervision/assist for financial management;Assist for transportation;Help with stairs or ramp for entrance   Equipment Recommendations  Other (comment) (defer to post acute)    Recommendations for Other Services       Precautions / Restrictions Precautions Precautions:  Fall;Shoulder Shoulder Interventions: Shoulder sling/immobilizer;At all times;Off for dressing/bathing/exercises Precaution Booklet Issued: Yes (comment) Precaution Comments: OK for AROM at elbow, wrist, and digits only Required Braces or Orthoses: Sling Restrictions Weight Bearing Restrictions: Yes RUE Weight Bearing: Non weight bearing     Mobility  Bed Mobility Overal bed mobility: Needs Assistance Bed Mobility: Supine to Sit     Supine to sit: Min guard     General bed mobility comments: HOB flattened, no rail; incr time and effort and cues not to push through Rt elbow (in sling)    Transfers Overall transfer level: Needs assistance Equipment used: None Transfers: Sit to/from Stand Sit to Stand: Mod assist, Min assist           General transfer comment: Wide BOS in standing with posterior lean. Repeated x 3 reps with improvement in anterior wt-shift and progressed to min assist    Ambulation/Gait Ambulation/Gait assistance: Max assist Gait Distance (Feet): 120 Feet Assistive device: 1 person hand held assist Gait Pattern/deviations: Step-to pattern, Decreased step length - left, Shuffle, Decreased stance time - right, Decreased dorsiflexion - left, Decreased weight shift to right, Decreased weight shift to left, Wide base of support Gait velocity: decreased Gait velocity interpretation: <1.31 ft/sec, indicative of household ambulator   General Gait Details: pt with extrememly short step length and height with very wide BOS and decr lateral wt-shifts over each LE. When cued to correct, he states "I'm just trying to go slow" Assist with weight-shifting at times effective in improving step clearance and length, however pt begins to guard and resist assist for wt-shifting   Stairs             Wheelchair Mobility    Modified Rankin (Stroke Patients Only) Modified  Rankin (Stroke Patients Only) Pre-Morbid Rankin Score: No significant disability Modified  Rankin: Moderately severe disability     Balance Overall balance assessment: Needs assistance Sitting-balance support: Feet supported Sitting balance-Leahy Scale: Fair Sitting balance - Comments: No LOB noted when sitting EOB   Standing balance support: Single extremity supported, During functional activity Standing balance-Leahy Scale: Poor Standing balance comment: Posterior lean and LOB                            Cognition Arousal/Alertness: Awake/alert Behavior During Therapy: Flat affect Overall Cognitive Status: Difficult to assess Area of Impairment: Attention, Memory, Safety/judgement, Awareness, Orientation                   Current Attention Level: Sustained Memory: Decreased recall of precautions, Decreased short-term memory   Safety/Judgement: Decreased awareness of safety, Decreased awareness of deficits Awareness: Intellectual Problem Solving: Difficulty sequencing, Requires verbal cues, Requires tactile cues, Decreased initiation, Slow processing General Comments: Pt unable to recall any precautions related to his shoulder. Patient with very low volume and garbled speech; can improve intelligibility with cues for volume and to slow down        Exercises General Exercises - Lower Extremity Long Arc Quad: AROM, Both, 10 reps, Seated    General Comments General comments (skin integrity, edema, etc.): VSS per monitor      Pertinent Vitals/Pain Pain Assessment Pain Assessment: Faces Faces Pain Scale: Hurts a little bit Pain Location: right shoulder Pain Descriptors / Indicators: Discomfort Pain Intervention(s): Limited activity within patient's tolerance, Monitored during session    Home Living                          Prior Function            PT Goals (current goals can now be found in the care plan section) Acute Rehab PT Goals Patient Stated Goal: to return home and to work Time For Goal Achievement:  10/13/22 Potential to Achieve Goals: Good Progress towards PT goals: Progressing toward goals    Frequency    Min 4X/week      PT Plan Current plan remains appropriate    Co-evaluation              AM-PAC PT "6 Clicks" Mobility   Outcome Measure  Help needed turning from your back to your side while in a flat bed without using bedrails?: A Little Help needed moving from lying on your back to sitting on the side of a flat bed without using bedrails?: A Little Help needed moving to and from a bed to a chair (including a wheelchair)?: A Lot Help needed standing up from a chair using your arms (e.g., wheelchair or bedside chair)?: A Lot Help needed to walk in hospital room?: A Lot Help needed climbing 3-5 steps with a railing? : Total 6 Click Score: 13    End of Session Equipment Utilized During Treatment: Gait belt Activity Tolerance: Patient tolerated treatment well Patient left: with call bell/phone within reach;in chair;with chair alarm set Nurse Communication: Mobility status PT Visit Diagnosis: Other abnormalities of gait and mobility (R26.89);Unsteadiness on feet (R26.81);Muscle weakness (generalized) (M62.81);Ataxic gait (R26.0)     Time: 4098-1191 PT Time Calculation (min) (ACUTE ONLY): 25 min  Charges:  $Gait Training: 23-37 mins  Jerolyn Center, PT Acute Rehabilitation Services  Office [phone]    Zena Amos 10/05/2022, 9:08 AM

## 2022-10-06 NOTE — Progress Notes (Signed)
Physical Therapy Treatment Patient Details Name: Richard Andrews MRN: [id number] DOB: 1961/04/02 Today's Date: 10/06/2022   History of Present Illness The pt is a 62 yo male presenting 4/13 after a fall onto R shoulder. +etoh. Imaging showed R proximal humerus fx and R subacute SDH. Pt with additional fall ~1 month ago for which he did not seek medical care, suspect SDH from prior fall. S/p ORIF R proximal humerus 4/16. MMA embolization 4/19. PMH includes: HTN, L wrist fx.    PT Comments    The pt presents in bed asleep upon arrival, does awaken and agree to PT session this morning. He requires increased stimulation and cues to complete tasks, but does engage with increased processing time. Due to poor posture, balance, and spatial awareness, pt continues to need significant assist and repeated cues to complete sit-stand transfers from recliner, and modA with verbal and tactile cues to manage static standing. He was able to progress ambulation distance this session, but continues to maintain short, staggering steps with minimal clearance and need for modA. He continues to benefit from some assist wt shifting, but is unable to make or maintain these corrections on his own.     Recommendations for follow up therapy are one component of a multi-disciplinary discharge planning process, led by the attending physician.  Recommendations may be updated based on patient status, additional functional criteria and insurance authorization.  Follow Up Recommendations       Assistance Recommended at Discharge Frequent or constant Supervision/Assistance  Patient can return home with the following Two people to help with walking and/or transfers;Two people to help with bathing/dressing/bathroom;Assistance with cooking/housework;Direct supervision/assist for medications management;Direct supervision/assist for financial management;Assist for transportation;Help with stairs or ramp for entrance   Equipment  Recommendations  Other (comment) (defer to post acute)    Recommendations for Other Services       Precautions / Restrictions Precautions Precautions: Fall;Shoulder Shoulder Interventions: Shoulder sling/immobilizer;At all times;Off for dressing/bathing/exercises Precaution Booklet Issued: Yes (comment) Precaution Comments: OK for AROM at elbow, wrist, and digits only Required Braces or Orthoses: Sling Restrictions Weight Bearing Restrictions: Yes RUE Weight Bearing: Non weight bearing     Mobility  Bed Mobility Overal bed mobility: Needs Assistance Bed Mobility: Supine to Sit     Supine to sit: Min guard     General bed mobility comments: HOB flattened, no rail; incr time and effort and cues not to push through Rt elbow (in sling)    Transfers Overall transfer level: Needs assistance Equipment used: 1 person hand held assist Transfers: Sit to/from Stand, Bed to chair/wheelchair/BSC Sit to Stand: Mod assist   Step pivot transfers: Mod assist       General transfer comment: pt needing increased cues for anterior wt shift and modA to rise and steady. completed x5 from recliner with same assist and cues each time.    Ambulation/Gait Ambulation/Gait assistance: Max assist Gait Distance (Feet): 12 Feet Assistive device: 1 person hand held assist Gait Pattern/deviations: Step-to pattern, Decreased step length - left, Shuffle, Decreased stance time - right, Decreased dorsiflexion - left, Decreased weight shift to right, Decreased weight shift to left, Wide base of support Gait velocity: decreased     General Gait Details: pt with extrememly short step length and height with very wide BOS and decr lateral wt-shifts over each LE. Assist with weight-shifting at times effective in improving step clearance and length, however pt begins to guard and resist assist for wt-shifting    Modified Rankin (Stroke  Patients Only) Modified Rankin (Stroke Patients Only) Pre-Morbid  Rankin Score: No significant disability Modified Rankin: Moderately severe disability     Balance Overall balance assessment: Needs assistance Sitting-balance support: Feet supported Sitting balance-Leahy Scale: Fair Sitting balance - Comments: No LOB noted when sitting EOB   Standing balance support: Single extremity supported, During functional activity Standing balance-Leahy Scale: Poor Standing balance comment: Posterior lean and LOB                            Cognition Arousal/Alertness: Awake/alert Behavior During Therapy: Flat affect Overall Cognitive Status: Difficult to assess Area of Impairment: Attention, Memory, Safety/judgement, Awareness, Orientation                   Current Attention Level: Sustained Memory: Decreased recall of precautions, Decreased short-term memory   Safety/Judgement: Decreased awareness of safety, Decreased awareness of deficits Awareness: Intellectual Problem Solving: Difficulty sequencing, Requires verbal cues, Requires tactile cues, Decreased initiation, Slow processing General Comments: pt asleep upon arrival, does awake to stimulation and conversation. flat affect and at times mumbles/garbles speech. pt needs repeated cues for technique for all movment with poor spatial awareness        Exercises Other Exercises Other Exercises: repeated sit-stand from recliner. x5 with pt needing cues for hand placement, anterior wt shift, and to decrease posterior lean each time    General Comments General comments (skin integrity, edema, etc.): VSS on RA      Pertinent Vitals/Pain Pain Assessment Pain Assessment: Faces Faces Pain Scale: Hurts a little bit Pain Location: right shoulder Pain Descriptors / Indicators: Discomfort Pain Intervention(s): Monitored during session, Repositioned     PT Goals (current goals can now be found in the care plan section) Acute Rehab PT Goals Patient Stated Goal: to return home and to  work PT Goal Formulation: With patient Time For Goal Achievement: 10/13/22 Potential to Achieve Goals: Good Progress towards PT goals: Progressing toward goals    Frequency    Min 4X/week      PT Plan Current plan remains appropriate       AM-PAC PT "6 Clicks" Mobility   Outcome Measure  Help needed turning from your back to your side while in a flat bed without using bedrails?: A Little Help needed moving from lying on your back to sitting on the side of a flat bed without using bedrails?: A Little Help needed moving to and from a bed to a chair (including a wheelchair)?: A Lot Help needed standing up from a chair using your arms (e.g., wheelchair or bedside chair)?: A Lot Help needed to walk in hospital room?: A Lot Help needed climbing 3-5 steps with a railing? : Total 6 Click Score: 13    End of Session Equipment Utilized During Treatment: Gait belt Activity Tolerance: Patient tolerated treatment well Patient left: with call bell/phone within reach;in chair;with chair alarm set Nurse Communication: Mobility status PT Visit Diagnosis: Other abnormalities of gait and mobility (R26.89);Unsteadiness on feet (R26.81);Muscle weakness (generalized) (M62.81);Ataxic gait (R26.0)     Time: 1610-9604 PT Time Calculation (min) (ACUTE ONLY): 35 min  Charges:  $Gait Training: 8-22 mins $Therapeutic Activity: 8-22 mins                     Vickki Muff, PT, DPT   Acute Rehabilitation Department Office [phone] Secure Chat Communication Preferred   Ronnie Derby 10/06/2022, 1:52 PM

## 2022-10-06 NOTE — TOC Progression Note (Signed)
Transition of Care [facility]) - Progression Note    Patient Details  Name: Richard Andrews MRN: [id number] Date of Birth: 1960-07-31  Transition of Care [facility]) CM/SW Contact  Glennon Mac, RN Phone Number: 10/06/2022, 5:15 PM  Clinical Narrative:    Returned completed and signed disability paperwork to patient.  He is appreciative and aware that it will need to be returned to his employer.    Expected Discharge Plan: IP Rehab Facility Barriers to Discharge: Continued Medical Work up  Expected Discharge Plan and Services   Discharge Planning Services: CM Consult   Living arrangements for the past 2 months: Single Family Home                                       Social Determinants of Health (SDOH) Interventions SDOH Screenings   Depression (PHQ2-9): Low Risk  (02/01/2021)  Tobacco Use: High Risk (10/04/2022)    Readmission Risk Interventions     No data to display         Quintella Baton, RN, BSN  Trauma/Neuro ICU Case Manager [phone]

## 2022-10-06 NOTE — Progress Notes (Signed)
Patient ID: Richard Andrews, male   DOB: 07-12-1960, 62 y.o.   MRN: [id number] 6 Days Post-Op    Subjective: Doing well, no new complaints ROS negative except as listed above. Objective: Vital signs in last 24 hours: Temp:  [97.6 F (36.4 C)-99.7 F (37.6 C)] 98.2 F (36.8 C) (04/25 0749) Pulse Rate:  [60-69] 60 (04/25 0749) Resp:  [11-15] 15 (04/25 0749) BP: (101-118)/(61-84) 106/61 (04/25 0749) SpO2:  [93 %-97 %] 94 % (04/25 0325) Last BM Date :  (PTA)  Intake/Output from previous day: 04/24 0701 - 04/25 0700 In: -  Out: 900 [Urine:900] Intake/Output this shift: Total I/O In: 300 [P.O.:300] Out: -   General appearance: alert and cooperative Resp: clear to auscultation bilaterally GI: soft, NT Extremities: RUE sling, less hand edema  Lab Results: CBC  No results for input(s): "WBC", "HGB", "HCT", "PLT" in the last 72 hours. BMET No results for input(s): "NA", "K", "CL", "CO2", "GLUCOSE", "BUN", "CREATININE", "CALCIUM" in the last 72 hours. PT/INR No results for input(s): "LABPROT", "INR" in the last 72 hours. ABG No results for input(s): "PHART", "HCO3" in the last 72 hours.  Invalid input(s): "PCO2", "PO2"  Studies/Results: No results found.  Anti-infectives: Anti-infectives (From admission, onward)    Start     Dose/Rate Route Frequency Ordered Stop   09/27/22 2100  ceFAZolin (ANCEF) IVPB 2g/100 mL premix        2 g 200 mL/hr over 30 Minutes Intravenous Every 6 hours 09/27/22 2027 09/28/22 0949   09/27/22 0745  ceFAZolin (ANCEF) IVPB 2g/100 mL premix        2 g 200 mL/hr over 30 Minutes Intravenous On call to O.R. 09/27/22 0654 09/27/22 1607       Assessment/Plan: Fall   SDH with slight shift, subacute - NSGY c/s, Dr. Syliva Overman, MMA embolization 4/19. Repeat CTH 4/14 stable. TBI team therapies. R comminuted humeral head fx - ORIF with Dr. Dion Saucier 4/16. Sling. NWB RUE, ok for AROM at elbow, wrist, and fingers EtOH abuse - CIWA, monitor, librium  taper ordered Thrombocytopenia - improved FEN - reg diet, BID Miralax and senna.  Milk of mag 4/23.  Suppository today. Hyponatremia improved DVT - SCDs, LMWH Dispo - 4NP, PT/OT/SLP. CIR insurance auth pending  LOS: 12 days    Violeta Gelinas, MD, MPH, FACS Trauma & General Surgery Use [email or web address] to contact on call provider  10/06/2022

## 2022-10-06 NOTE — Progress Notes (Signed)
IP rehab admissions - Updates requested and sent to [facility] [place] this am.  Awaiting insurance decision regarding potential CIR admission.  [phone]

## 2022-10-07 ENCOUNTER — Encounter (HOSPITAL_COMMUNITY): Payer: Self-pay | Admitting: Physical Medicine and Rehabilitation

## 2022-10-07 ENCOUNTER — Other Ambulatory Visit: Payer: Self-pay

## 2022-10-07 ENCOUNTER — Inpatient Hospital Stay (HOSPITAL_COMMUNITY)
Admission: RE | Admit: 2022-10-07 | Discharge: 2022-10-22 | DRG: 884 | Disposition: A | Discharge status: Home / Self Care | Payer: BC Managed Care – PPO | Source: Intra-hospital | Attending: Physical Medicine and Rehabilitation | Admitting: Physical Medicine and Rehabilitation

## 2022-10-07 DIAGNOSIS — R4189 Other symptoms and signs involving cognitive functions and awareness: Secondary | ICD-10-CM | POA: Diagnosis not present

## 2022-10-07 DIAGNOSIS — K5900 Constipation, unspecified: Secondary | ICD-10-CM | POA: Diagnosis not present

## 2022-10-07 DIAGNOSIS — E876 Hypokalemia: Secondary | ICD-10-CM | POA: Diagnosis present

## 2022-10-07 DIAGNOSIS — D539 Nutritional anemia, unspecified: Secondary | ICD-10-CM | POA: Diagnosis present

## 2022-10-07 DIAGNOSIS — R4587 Impulsiveness: Secondary | ICD-10-CM | POA: Diagnosis present

## 2022-10-07 DIAGNOSIS — Z79899 Other long term (current) drug therapy: Secondary | ICD-10-CM | POA: Diagnosis not present

## 2022-10-07 DIAGNOSIS — Z8249 Family history of ischemic heart disease and other diseases of the circulatory system: Secondary | ICD-10-CM | POA: Diagnosis not present

## 2022-10-07 DIAGNOSIS — I10 Essential (primary) hypertension: Secondary | ICD-10-CM | POA: Diagnosis present

## 2022-10-07 DIAGNOSIS — S065X0A Traumatic subdural hemorrhage without loss of consciousness, initial encounter: Secondary | ICD-10-CM

## 2022-10-07 DIAGNOSIS — R471 Dysarthria and anarthria: Secondary | ICD-10-CM | POA: Diagnosis not present

## 2022-10-07 DIAGNOSIS — S42201A Unspecified fracture of upper end of right humerus, initial encounter for closed fracture: Secondary | ICD-10-CM | POA: Diagnosis not present

## 2022-10-07 DIAGNOSIS — R400 Somnolence: Secondary | ICD-10-CM | POA: Diagnosis not present

## 2022-10-07 DIAGNOSIS — R32 Unspecified urinary incontinence: Secondary | ICD-10-CM | POA: Diagnosis not present

## 2022-10-07 DIAGNOSIS — Z83438 Family history of other disorder of lipoprotein metabolism and other lipidemia: Secondary | ICD-10-CM

## 2022-10-07 DIAGNOSIS — F17210 Nicotine dependence, cigarettes, uncomplicated: Secondary | ICD-10-CM | POA: Diagnosis not present

## 2022-10-07 DIAGNOSIS — S065XAD Traumatic subdural hemorrhage with loss of consciousness status unknown, subsequent encounter: Secondary | ICD-10-CM | POA: Diagnosis not present

## 2022-10-07 DIAGNOSIS — Y92009 Unspecified place in unspecified non-institutional (private) residence as the place of occurrence of the external cause: Secondary | ICD-10-CM | POA: Diagnosis not present

## 2022-10-07 DIAGNOSIS — M25511 Pain in right shoulder: Secondary | ICD-10-CM | POA: Diagnosis not present

## 2022-10-07 DIAGNOSIS — S42201D Unspecified fracture of upper end of right humerus, subsequent encounter for fracture with routine healing: Secondary | ICD-10-CM

## 2022-10-07 DIAGNOSIS — F1020 Alcohol dependence, uncomplicated: Secondary | ICD-10-CM | POA: Diagnosis not present

## 2022-10-07 DIAGNOSIS — F1010 Alcohol abuse, uncomplicated: Secondary | ICD-10-CM | POA: Diagnosis not present

## 2022-10-07 DIAGNOSIS — S065XAA Traumatic subdural hemorrhage with loss of consciousness status unknown, initial encounter: Secondary | ICD-10-CM | POA: Diagnosis not present

## 2022-10-07 DIAGNOSIS — M79621 Pain in right upper arm: Secondary | ICD-10-CM | POA: Diagnosis not present

## 2022-10-07 DIAGNOSIS — D61818 Other pancytopenia: Secondary | ICD-10-CM | POA: Diagnosis present

## 2022-10-07 DIAGNOSIS — K5901 Slow transit constipation: Secondary | ICD-10-CM | POA: Diagnosis not present

## 2022-10-07 MED ORDER — ENOXAPARIN SODIUM 30 MG/0.3ML IJ SOSY
30.0000 mg | PREFILLED_SYRINGE | Freq: Two times a day (BID) | INTRAMUSCULAR | Status: DC
  Administered 2022-10-07 – 2022-10-22 (×30): 30 mg via SUBCUTANEOUS
  Filled 2022-10-07 (×32): qty 0.3

## 2022-10-07 MED ORDER — MAGNESIUM HYDROXIDE 400 MG/5ML PO SUSP: 30.0000 mL | Freq: Every day | ORAL | Status: DC | PRN

## 2022-10-07 MED ORDER — ENOXAPARIN SODIUM 30 MG/0.3ML IJ SOSY: 30.0000 mg | PREFILLED_SYRINGE | Freq: Two times a day (BID) | INTRAMUSCULAR | Status: DC

## 2022-10-07 MED ORDER — SORBITOL 70 % SOLN: 30.0000 mL | Freq: Every day | Status: DC | PRN

## 2022-10-07 MED ORDER — OXYCODONE HCL 5 MG PO TABS
10.0000 mg | ORAL_TABLET | ORAL | Status: DC | PRN
  Administered 2022-10-08 – 2022-10-09 (×2): 10 mg via ORAL
  Filled 2022-10-07 (×2): qty 2

## 2022-10-07 MED ORDER — METHOCARBAMOL 500 MG PO TABS
500.0000 mg | ORAL_TABLET | Freq: Four times a day (QID) | ORAL | Status: DC | PRN
  Administered 2022-10-14 – 2022-10-18 (×4): 500 mg via ORAL
  Filled 2022-10-07 (×4): qty 1

## 2022-10-07 MED ORDER — ALUM & MAG HYDROXIDE-SIMETH 200-200-20 MG/5ML PO SUSP: 30.0000 mL | ORAL | Status: DC | PRN

## 2022-10-07 MED ORDER — GUAIFENESIN-DM 100-10 MG/5ML PO SYRP: 5.0000 mL | ORAL_SOLUTION | Freq: Four times a day (QID) | ORAL | Status: DC | PRN

## 2022-10-07 MED ORDER — THIAMINE MONONITRATE 100 MG PO TABS
100.0000 mg | ORAL_TABLET | Freq: Every day | ORAL | Status: DC
  Administered 2022-10-08 – 2022-10-22 (×15): 100 mg via ORAL
  Filled 2022-10-07 (×15): qty 1

## 2022-10-07 MED ORDER — PANTOPRAZOLE SODIUM 40 MG PO TBEC
40.0000 mg | DELAYED_RELEASE_TABLET | Freq: Every day | ORAL | Status: DC
  Administered 2022-10-08 – 2022-10-22 (×15): 40 mg via ORAL
  Filled 2022-10-07 (×15): qty 1

## 2022-10-07 MED ORDER — POLYETHYLENE GLYCOL 3350 17 G PO PACK
17.0000 g | PACK | Freq: Two times a day (BID) | ORAL | Status: DC
  Administered 2022-10-07 – 2022-10-19 (×14): 17 g via ORAL
  Filled 2022-10-07 (×29): qty 1

## 2022-10-07 MED ORDER — ADULT MULTIVITAMIN W/MINERALS CH
1.0000 | ORAL_TABLET | Freq: Every day | ORAL | Status: DC
  Administered 2022-10-08 – 2022-10-22 (×15): 1 via ORAL
  Filled 2022-10-07 (×15): qty 1

## 2022-10-07 MED ORDER — TRAZODONE HCL 50 MG PO TABS
25.0000 mg | ORAL_TABLET | Freq: Every evening | ORAL | Status: DC | PRN
  Administered 2022-10-09 – 2022-10-22 (×4): 50 mg via ORAL
  Filled 2022-10-07 (×5): qty 1

## 2022-10-07 MED ORDER — FOLIC ACID 1 MG PO TABS
1.0000 mg | ORAL_TABLET | Freq: Every day | ORAL | Status: DC
  Administered 2022-10-08 – 2022-10-22 (×15): 1 mg via ORAL
  Filled 2022-10-07 (×15): qty 1

## 2022-10-07 MED ORDER — SENNOSIDES-DOCUSATE SODIUM 8.6-50 MG PO TABS
1.0000 | ORAL_TABLET | Freq: Two times a day (BID) | ORAL | Status: DC
  Administered 2022-10-07 – 2022-10-21 (×28): 1 via ORAL
  Filled 2022-10-07 (×33): qty 1

## 2022-10-07 MED ORDER — OXYCODONE HCL 5 MG PO TABS
5.0000 mg | ORAL_TABLET | ORAL | Status: DC | PRN
  Administered 2022-10-10 – 2022-10-14 (×9): 5 mg via ORAL
  Filled 2022-10-07 (×10): qty 1

## 2022-10-07 MED ORDER — MAGNESIUM CITRATE PO SOLN: 1.0000 | Freq: Once | ORAL | Status: DC | PRN

## 2022-10-07 NOTE — Progress Notes (Signed)
Patient transferred to 4MW13 via wheelchair, Receiving RN aware of patient's arrival. All belongings sent with patient. Clothes, shoes, 2 pairs of glasses and clothes.

## 2022-10-07 NOTE — Progress Notes (Addendum)
Patient ID: Richard Andrews, male   DOB: 1960/07/02, 62 y.o.   MRN: [id number] 7 Days Post-Op    Subjective: NAEON. Awaiting insurance auth for CIR   Objective: Vital signs in last 24 hours: Temp:  [97.7 F (36.5 C)-98.5 F (36.9 C)] 97.7 F (36.5 C) (04/26 0748) Pulse Rate:  [56-70] 56 (04/26 0748) Resp:  [12-14] 14 (04/26 0748) BP: (99-114)/(69-77) 109/77 (04/26 0748) SpO2:  [90 %-97 %] 96 % (04/26 0748) Last BM Date :  (PTA)  Intake/Output from previous day: 04/25 0701 - 04/26 0700 In: 300 [P.O.:300] Out: 300 [Urine:300] Intake/Output this shift: No intake/output data recorded.  General appearance: alert and cooperative Resp: clear to auscultation bilaterally GI: soft, NT Extremities: RUE sling, less hand edema  Lab Results: CBC  No results for input(s): "WBC", "HGB", "HCT", "PLT" in the last 72 hours. BMET No results for input(s): "NA", "K", "CL", "CO2", "GLUCOSE", "BUN", "CREATININE", "CALCIUM" in the last 72 hours. PT/INR No results for input(s): "LABPROT", "INR" in the last 72 hours. ABG No results for input(s): "PHART", "HCO3" in the last 72 hours.  Invalid input(s): "PCO2", "PO2"  Studies/Results: No results found.  Anti-infectives: Anti-infectives (From admission, onward)    Start     Dose/Rate Route Frequency Ordered Stop   09/27/22 2100  ceFAZolin (ANCEF) IVPB 2g/100 mL premix        2 g 200 mL/hr over 30 Minutes Intravenous Every 6 hours 09/27/22 2027 09/28/22 0949   09/27/22 0745  ceFAZolin (ANCEF) IVPB 2g/100 mL premix        2 g 200 mL/hr over 30 Minutes Intravenous On call to O.R. 09/27/22 0654 09/27/22 1607       Assessment/Plan: Fall   SDH with slight shift, subacute - NSGY c/s, Dr. Syliva Overman, MMA embolization 4/19. Repeat CTH 4/14 stable. TBI team therapies. R comminuted humeral head fx - ORIF with Dr. Dion Saucier 4/16. Sling. NWB RUE, ok for AROM at elbow, wrist, and fingers EtOH abuse - CIWA, monitor, librium taper  ordered Thrombocytopenia - improved  FEN - reg diet, BID Miralax and senna.  Milk of mag 4/23.  Suppository today. Hyponatremia improved DVT - SCDs, LMWH ID - no current abx  Dispo - 4NP, PT/OT/SLP. CIR insurance auth pending  LOS: 13 days    Richard Andrews, [facility] 10/07/2022, 9:38 AM Please see Amion for pager number during day hours 7:00am-4:30pm

## 2022-10-07 NOTE — Plan of Care (Signed)
  Problem: Clinical Measurements: Goal: Ability to maintain clinical measurements within normal limits will improve Outcome: Progressing   Problem: Elimination: Goal: Will not experience complications related to bowel motility Outcome: Progressing Goal: Will not experience complications related to urinary retention Outcome: Progressing   Problem: Pain Managment: Goal: General experience of comfort will improve Outcome: Progressing   Problem: Safety: Goal: Ability to remain free from injury will improve Outcome: Progressing

## 2022-10-07 NOTE — Progress Notes (Signed)
Inpatient Rehab Admissions Coordinator:    I have received insurance auth for CIR and have a bed for him today. RN may call report to [phone].  Pt. In agreement to dc to CIR today to participate in an intensive rehab program for an estimated 16-18 days with the goal of discharging at [facility] level and going home with brother in law [place], who can provide 24/7 support.  Rudell Cobb has confirmed ability to provide support but states he cannot provide more than min assist, if Pt. Requires more assistance with ADLs, wife can assist PRN.   Megan Salon, MS, CCC-SLP Rehab Admissions Coordinator  [phone] (celll) [phone] (office)

## 2022-10-07 NOTE — Progress Notes (Signed)
PMR Admission Coordinator Pre-Admission Assessment   Patient: Richard Andrews is an 62 y.o., male MRN: [id number] DOB: 1960/08/21 Height: 6' (182.9 cm) Weight: 66.3 kg   Insurance Information HMO:     PPO: Yes     PCP:      IPA:      80/20:      OTHER: Group [id number] PRIMARY: [employer] [place]     Policy#: [id number]      Subscriber: self CM Name: Brayton El      Phone#: [phone]  Fax#: [phone]  Britney with [employer] [place] called with approval 4/26 for admit 4/26-5/2 with update due 5/3 Pre-Cert#: [id number]      Employer:  Benefits:  Phone #:      Name:  Eff Date: 06/13/2017- 06/12/9998 Deductible: $1,000 ($0 met) OOP Max: $5,000 ($0 met) CIR: 80% coverage; 20% co-insurance SNF: 80% coverage; 20% co-insurance Outpatient:  80% coverage; 20% co-insurance Home Health:  80% coverage; 20% co-insurance DME: 80% coverage; 20% co-insurance Providers: in network  SECONDARY:       Policy#:      Phone#:    Artist:       Phone#:    The Data processing manager" for patients in Inpatient Rehabilitation Facilities with attached "Privacy Act Statement-Health Care Records" was provided and verbally reviewed with: N/A   Emergency Contact Information Contact Information       Name Relation Home Work Mobile    Qian,Kayla Daughter     [phone]    BEACHER, EVERY     [phone]    Moone,Christina Spouse [phone]               Current Medical History  Patient Admitting Diagnosis: Fall, R SDH, R humerus fracture   History of Present Illness: A 62 yo male presented by EMS to [facility] Emergency Department on 09/1322 after a fall onto R shoulder. +etoh. Imaging showed R proximal humerus fx and R subacute SDH. Pt with additional fall ~1 month ago for which he did not seek medical care, suspect SDH from prior fall. He was transferred to [facility] later on 09/24/22 for further treatment.  On 09/27/22 patient underwent ORIF R proximal humerus fracture  by Dr. Dion Saucier.  PT/OT/SLP evaluations were completed with recommendations for acute inpatient rehab admission. Patient is to underwent a right MMA embolization on 09/30/22. Tolerated well post-op   Patient's medical record from [facility] has been reviewed by the rehabilitation admission coordinator and physician.   Past Medical History      Past Medical History:  Diagnosis Date   Allergy     Distal radius fracture, left     Hypertension        Has the patient had major surgery during 100 days prior to admission? Yes   Family History   family history includes Cancer in his father; Heart disease in his mother; Hyperlipidemia in his mother; Multiple myeloma in his sister; Skin cancer in his mother; Throat cancer in his sister.   Current Medications   Current Facility-Administered Medications:    acetaminophen (TYLENOL) tablet 650 mg, 650 mg, Oral, Q4H PRN, Armida Sans, PA-C, 650 mg at 09/25/22 1702   bisacodyl (DULCOLAX) suppository 10 mg, 10 mg, Rectal, Daily PRN, Barnetta Chapel, PA-C   chlordiazePOXIDE (LIBRIUM) capsule 25 mg, 25 mg, Oral, TID, Manson Passey, Blaine K, PA-C, 25 mg at 09/29/22 0932   Chlorhexidine Gluconate Cloth 2 % PADS 6 each, 6 each, Topical, Daily, Kris Mouton  N, MD, 6 each at 09/29/22 0923   docusate sodium (COLACE) capsule 100 mg, 100 mg, Oral, BID, Armida Sans, PA-C, 100 mg at 09/29/22 0923   enoxaparin (LOVENOX) injection 30 mg, 30 mg, Subcutaneous, Q12H, Barnetta Chapel, PA-C, 30 mg at 09/29/22 1244   folic acid (FOLVITE) tablet 1 mg, 1 mg, Oral, Daily, Manson Passey, Blaine K, PA-C, 1 mg at 09/29/22 2956   hydrALAZINE (APRESOLINE) injection 10 mg, 10 mg, Intravenous, Q2H PRN, Manson Passey, Blaine K, PA-C   menthol-cetylpyridinium (CEPACOL) lozenge 3 mg, 1 lozenge, Oral, PRN **OR** phenol (CHLORASEPTIC) mouth spray 1 spray, 1 spray, Mouth/Throat, PRN, Manson Passey, Blaine K, PA-C   morphine (PF) 2 MG/ML injection 1-2 mg, 1-2 mg, Intravenous, Q2H PRN, Manson Passey, Blaine K, PA-C,  2 mg at 09/28/22 0056   multivitamin with minerals tablet 1 tablet, 1 tablet, Oral, Daily, Janine Ores K, PA-C, 1 tablet at 09/29/22 2130   mupirocin ointment (BACTROBAN) 2 % 1 Application, 1 Application, Nasal, BID, Diamantina Monks, MD, 1 Application at 09/29/22 0923   ondansetron (ZOFRAN-ODT) disintegrating tablet 4 mg, 4 mg, Oral, Q6H PRN **OR** ondansetron (ZOFRAN) injection 4 mg, 4 mg, Intravenous, Q6H PRN, Armida Sans, PA-C   Oral care mouth rinse, 15 mL, Mouth Rinse, PRN, Manson Passey, Blaine K, PA-C   oxyCODONE (Oxy IR/ROXICODONE) immediate release tablet 10 mg, 10 mg, Oral, Q4H PRN, Manson Passey, Blaine K, PA-C, 10 mg at 09/27/22 0023   oxyCODONE (Oxy IR/ROXICODONE) immediate release tablet 5 mg, 5 mg, Oral, Q4H PRN, Manson Passey, Blaine K, PA-C   pantoprazole (PROTONIX) EC tablet 40 mg, 40 mg, Oral, Daily, 40 mg at 09/29/22 0923 **OR** pantoprazole (PROTONIX) injection 40 mg, 40 mg, Intravenous, Daily, Brown, Blaine K, PA-C, 40 mg at 09/25/22 1007   polyethylene glycol (MIRALAX / GLYCOLAX) packet 17 g, 17 g, Oral, Daily, Barnetta Chapel, PA-C, 17 g at 09/29/22 1243   thiamine (VITAMIN B1) tablet 100 mg, 100 mg, Oral, Daily, 100 mg at 09/29/22 8657 **OR** thiamine (VITAMIN B1) injection 100 mg, 100 mg, Intravenous, Daily, Manson Passey, Blaine K, PA-C   Patients Current Diet:  Diet Order                  Diet NPO time specified Except for: Sips with Meds  Diet effective ____             Diet regular Room service appropriate? Yes; Fluid consistency: Thin  Diet effective now                         Precautions / Restrictions Precautions Precautions: Fall, Shoulder Precaution Booklet Issued: Yes (comment) Precaution Comments: OK for AROM at elbow, wrist, and digits only Restrictions Weight Bearing Restrictions: Yes RUE Weight Bearing: Non weight bearing    Has the patient had 2 or more falls or a fall with injury in the past year? Yes   Prior Activity Level Community (5-7x/wk): Works FT as a  Emergency planning/management officer, was driving.   Prior Functional Level Self Care: Did the patient need help bathing, dressing, using the toilet or eating? Independent   Indoor Mobility: Did the patient need assistance with walking from room to room (with or without device)? Independent   Stairs: Did the patient need assistance with internal or external stairs (with or without device)? Independent   Functional Cognition: Did the patient need help planning regular tasks such as shopping or remembering to take medications? Independent   Patient Information Are you of Hispanic, Latino/a,or [place]  origin?: A. No, not of Hispanic, Latino/a, or Spanish origin What is your race?: A. White Do you need or want an interpreter to communicate with a doctor or health care staff?: 0. No   Patient's Response To:  Health Literacy and Transportation Is the patient able to respond to health literacy and transportation needs?: Yes Health Literacy - How often do you need to have someone help you when you read instructions, pamphlets, or other written material from your doctor or pharmacy?: Often In the past 12 months, has lack of transportation kept you from medical appointments or from getting medications?: No In the past 12 months, has lack of transportation kept you from meetings, work, or from getting things needed for daily living?: No   Home Assistive Devices / Equipment Home Equipment: Agricultural consultant (2 wheels)   Prior Device Use: Indicate devices/aids used by the patient prior to current illness, exacerbation or injury? None of the above   Current Functional Level Cognition   Overall Cognitive Status: Impaired/Different from baseline Current Attention Level: Sustained Orientation Level: Oriented to person, Oriented to place, Oriented to time, Disoriented to situation General Comments: pt not aware of why he is in hospitale, flat affect and not conversational until directly prompted. Attention: Focused,  Sustained Focused Attention: Impaired Focused Attention Impairment: Verbal basic Sustained Attention: Impaired Sustained Attention Impairment: Verbal basic Memory: Impaired Memory Impairment: Decreased short term memory Awareness: Impaired Problem Solving: Impaired Executive Function: Reasoning Reasoning: Impaired    Extremity Assessment (includes Sensation/Coordination)   Upper Extremity Assessment: Defer to OT evaluation RUE Deficits / Details: shoulder sling in place with precautions for no AROM at the shoulder.  Pt with decreased strength in the right elbow at 2/5 for flex/ext, wrist ext 2+/5, grip 3+/5. RUE Sensation: WNL RUE Coordination: decreased gross motor  Lower Extremity Assessment: Generalized weakness, RLE deficits/detail, LLE deficits/detail (grossly 4/5 to MMT, but poor ability to maintain knee extension in standing or coordinate movements of LE for stepping to command.) RLE Deficits / Details: grossly 4/5 to MMT, poor tolerance of functional movement, sit-stand, and endurance in standing. RLE Coordination: decreased fine motor, decreased gross motor LLE Deficits / Details: grossly 4/5 to MMT, poor tolerance of functional movement, sit-stand, and endurance in standing. LLE Coordination: decreased fine motor, decreased gross motor     ADLs   Overall ADL's : Needs assistance/impaired Eating/Feeding: Set up, Sitting Eating/Feeding Details (indicate cue type and reason): using LUE only Upper Body Bathing: Minimal assistance, Sitting Upper Body Bathing Details (indicate cue type and reason): LUE only Lower Body Bathing: Maximal assistance, Sit to/from stand Lower Body Bathing Details (indicate cue type and reason): simulated Upper Body Dressing : Maximal assistance, Sitting Upper Body Dressing Details (indicate cue type and reason): including sling Lower Body Dressing: Sit to/from stand, Total assistance Lower Body Dressing Details (indicate cue type and reason): total  assist to donn gripper socks Toilet Transfer: Maximal assistance, Stand-pivot Toilet Transfer Details (indicate cue type and reason): simulated to recliner Toileting- Clothing Manipulation and Hygiene: Maximal assistance, Sit to/from stand Toileting - Clothing Manipulation Details (indicate cue type and reason): simulated sit to stand Functional mobility during ADLs: Maximal assistance (stand step to the bedside recliner) General ADL Comments: Pt with dysarthric speech at times and difficult to understand.  Completed AAROM exercises EOB for the right elbow, wrist, and hand for 1 set of 10 reps.  Pt with decreased AROM at the elbow and the wrist against gravity.  Educated on donning and doffing sling but  will need extensive practice secondary to cognitive impairment.  Noted body tremor in sitting as well as slight ataxia in standing.  He maintained knees flexed in standing with max assist for weightshift to take steps to the recliner.  He reports having his brother-in-law living with him and his son coming over occasionally.  He states his brother in law is on "disability" but doesn't use an assistive device and would be able to assist him.     Mobility   Overal bed mobility: Needs Assistance Bed Mobility: Supine to Sit Supine to sit: Mod assist General bed mobility comments: pt pulling on therapist with LUE, max cues to not use RUE. assist to complete movement with LE     Transfers   Overall transfer level: Needs assistance Equipment used: 1 person hand held assist Transfers: Sit to/from Stand, Bed to chair/wheelchair/BSC Sit to Stand: Mod assist Bed to/from chair/wheelchair/BSC transfer type:: Step pivot Step pivot transfers: Max assist General transfer comment: modA to rise to standign, poor anterior wt shift and pt unable to maintain knees extended. significant difficulty coordinating steps laterally to chair, pt taking steps forwards, backwards, and to side. increased difficulty moving L  foot     Ambulation / Gait / Stairs / Wheelchair Mobility   Ambulation/Gait Ambulation/Gait assistance: Max Chemical engineer (Feet): 7 Feet Assistive device: 1 person hand held assist Gait Pattern/deviations: Step-to pattern, Decreased step length - left, Knee flexed in stance - right, Knee flexed in stance - left, Knees buckling, Shuffle, Ataxic, Scissoring General Gait Details: pt needing max cues for steps, minimal clearance and movement of LLE even with assist to wt shift. pt sitting early despite max cues to maintain upright. Gait velocity: decreased     Posture / Balance Dynamic Sitting Balance Sitting balance - Comments: No LOB noted when sitting EOB and working on donning gripper socks. Balance Overall balance assessment: Needs assistance Sitting-balance support: Feet supported Sitting balance-Leahy Scale: Fair Sitting balance - Comments: No LOB noted when sitting EOB and working on donning gripper socks. Standing balance support: Single extremity supported, During functional activity Standing balance-Leahy Scale: Poor Standing balance comment: dependent on UE support and maxA     Special needs/care consideration Skin Right shoulder post op dressing     Previous Home Environment (from acute therapy documentation) Living Arrangements: Other (Comment) (brother-in-law and sometimes his son comes and stays) Available Help at Discharge: Available 24 hours/day (brother-in-law is disabled but doesn't use assistive device.  Pt reports he can provide assist) Type of Home: House Home Layout: Two level, 1/2 bath on main level Alternate Level Stairs-Rails: Right, Left, Can reach both Alternate Level Stairs-Number of Steps: approximately 14 Home Access: Ramped entrance Bathroom Shower/Tub: Engineer, manufacturing systems: Handicapped height Bathroom Accessibility: Yes Additional Comments: RW was his wife's mothers walker   Discharge Living Setting Plans for Discharge Living  Setting: House, Lives with (comment) (Lives with his BIL Freddie.) Type of Home at Discharge: House Discharge Home Layout: Two level, 1/2 bath on main level, Bed/bath upstairs Alternate Level Stairs-Number of Steps: 12 steps to 2nd level with bedroom and full bath Discharge Home Access: Ramped entrance Discharge Bathroom Shower/Tub: Tub/shower unit, Curtain Discharge Bathroom Toilet: Standard Discharge Bathroom Accessibility: Yes How Accessible: Accessible via walker   Social/Family/Support Systems Patient Roles: Spouse, Parent (Has a wife, daughter and a son.) Contact Information: Willow Reczek - wife - [phone] Anticipated Caregiver: Rudell Cobb, wife and daughter Ability/Limitations of Caregiver: Rudell Cobb the BIL is cardiac disabled but not using a  device and can provide supervision and care. Caregiver Availability: 24/7 Discharge Plan Discussed with Primary Caregiver: Yes Is Caregiver In Agreement with Plan?: Yes Does Caregiver/Family have Issues with Lodging/Transportation while Pt is in Rehab?: No   Goals Patient/Family Goal for Rehab: PT/OT/SLP supervision to min assist goals Expected length of stay:16-18  days Pt/Family Agrees to Admission and willing to participate: Yes Program Orientation Provided & Reviewed with Pt/Caregiver Including Roles  & Responsibilities: Yes   Decrease burden of Care through IP rehab admission: N/A   Possible need for SNF placement upon discharge: Not planned   Patient Condition: I have reviewed medical records from [facility], spoken with CM, and patient, spouse, and daughter. I met with patient at the bedside and discussed via phone for inpatient rehabilitation assessment.  Patient will benefit from ongoing PT, OT, and SLP, can actively participate in 3 hours of therapy a day 5 days of the week, and can make measurable gains during the admission.  Patient will also benefit from the coordinated team approach during an Inpatient Acute  Rehabilitation admission.  The patient will receive intensive therapy as well as Rehabilitation physician, nursing, social worker, and care management interventions.  Due to bladder management, bowel management, safety, skin/wound care, disease management, medication administration, pain management, and patient education the patient requires 24 hour a day rehabilitation nursing.  The patient is currently Mod-Max  with mobility and basic ADLs.  Discharge setting and therapy post discharge at home with home health is anticipated.  Patient has agreed to participate in the Acute Inpatient Rehabilitation Program and will admit tomorrow.   Preadmission Screen Completed By:  Trish Mage, 09/29/2022 2:09 PM with updates by Megan Salon, MS, CCC-SLP   ______________________________________________________________________   Discussed status with Dr. Shearon Stalls  on 10/07/22  at 930  and received approval for admission today.   Admission Coordinator:  Trish Mage, RN, time 1228 Dorna Bloom 10/07/22    Assessment/Plan: Diagnosis: Does the need for close, 24 hr/day Medical supervision in concert with the patient's rehab needs make it unreasonable for this patient to be served in a less intensive setting? Yes Co-Morbidities requiring supervision/potential complications: SHD s/p MMA embolization, R humeral fractures, alcohol abuse in withdrawal, thrombocytopenia, anemia, constipation, urinary incontinence Due to bladder management, bowel management, safety, skin/wound care, disease management, medication administration, pain management, and patient education, does the patient require 24 hr/day rehab nursing? Yes Does the patient require coordinated care of a physician, rehab nurse, PT, OT, and SLP to address physical and functional deficits in the context of the above medical diagnosis(es)? Yes Addressing deficits in the following areas: balance, endurance, locomotion, strength, transferring, bowel/bladder control,  bathing, dressing, feeding, grooming, toileting, cognition, speech, and language Can the patient actively participate in an intensive therapy program of at least 3 hrs of therapy 5 days a week? Yes The potential for patient to make measurable gains while on inpatient rehab is good Anticipated functional outcomes upon discharge from inpatient rehab: min assist PT, min assist OT, min assist SLP Estimated rehab length of stay to reach the above functional goals is: 16-18 days Anticipated discharge destination: Home 10. Overall Rehab/Functional Prognosis: good     MD Signature:   Angelina Sheriff, DO 10/07/2022

## 2022-10-07 NOTE — Discharge Summary (Signed)
Patient ID: Richard Andrews [id number] 27-Sep-1960 62 y.o.  Admit date: 09/24/2022 Discharge date: 10/07/2022  Admitting Diagnosis: s/p fall L right subdural hemorrhage with slight shift Right comminuted humeral head fracture Alcohol abuse History of macrocytic anemia  Thrombocytopenia Mild hypokalemia L elbow skin tear Tobacco abuse History gastric/duodenal ulcers  Discharge Diagnosis Patient Active Problem List   Diagnosis Date Noted   Fall 09/24/2022   Alcohol withdrawal syndrome without complication (HCC)    Gastritis and gastroduodenitis    Multiple gastric ulcers    Duodenal ulcer    Acute blood loss anemia    Alcohol withdrawal seizure with complication (HCC)    GI bleed 01/19/2021   QT prolongation 01/19/2021   Electrolyte imbalance 02/06/2020   Macrocytic anemia 02/06/2020   Acute pain of right wrist 02/06/2020   Encounter for orthopedic follow-up care 01/28/2020   Open Colles' fracture of right radius 01/10/2020   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 Eudelia Bunch. Conchita Paris - NSGY Dr. Dion Saucier - ortho  Reason for Admission: Richard Andrews is an 62 y.o. male who is here for evaluation after falling.  History is provided by the patient and the family member.  Patient states that he fell around 3:00 today.  He does not really remember much.  He landed on his right side.  Family member found him laying on the right side.  He had complained of right shoulder pain and was brought to [facility] emergency room.  He has a significant history of alcohol abuse.  He had a CT scan of his head and C-spine and a shoulder x-ray.  He  was found to have a proximal humerus fracture along with a subdural hemorrhage and trauma surgery was consulted.   Denies any left upper extremity pain, neck pain, blurry vision, abdominal pain, chest pain, back pain, lower extremity pain.   Drinks about 1-1/2 pints of whiskey a day.  He smokes about a pack per day.  Denies any drug use.   Family ember states that the patient fell approximately 1 month ago and had a significant left forehead bruising and hematoma along with the superficial laceration.  He did not seek medical care at that time.  Family member showed me a picture.   Patient has a history of alcohol dependence, falls and hypertension along with an admission for alcohol abuse with DTs with acute blood loss anemia due to gastric duodenal ulcers in August 2022  Procedures ORIF of R humeral head fx - Dr. Dion Saucier 4/16 MMA embolization - Dr. Conchita Paris 4/19  Hospital Course:  Fall   SDH with slight shift, subacute  NSGY evaluated the patient on admission with no acute intervention warranted.  Follow up head CT on 4/14 was stable.  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  Therefore, he underwent a MMA embolization on 4/19 by Dr. Conchita Paris.  He tolerated this well.  He was evaluated by TBI therapies and CIR was recommended.  R comminuted humeral head fx  ORIF with Dr. Dion Saucier 4/16. Sling. NWB RUE, ok for AROM at elbow, wrist, and fingers.  EtOH abuse  CIWA, monitor, librium taper ordered and completed.  Thrombocytopenia  This was found initially, but improved with no other issues.  He was stable on HD 13 for DC to CIR after insurance authorization was obtained.    Physical Exam: See progress note from earlier today  Medications: Per CIR upon arrival    Follow-up Information     Teryl Lucy, MD. Schedule an appointment as soon as possible for a visit in 2 week(s).   Specialty: Orthopedic Surgery Contact information: [address] [name] [address] [phone]         Tia Alert, MD Follow up in 3 week(s).   Specialty: Neurosurgery Contact information: [address] [name] [address] [phone]                 Signed: Barnetta Chapel, [facility] 10/07/2022, 1:18 PM Please see Amion for pager number during day hours 7:00am-4:30pm, 7-11:30am on Weekends

## 2022-10-07 NOTE — H&P (Signed)
Physical Medicine and Rehabilitation Admission H&P     CC: Functional deficits secondary to traumatic SDH and right humerus fracture   HPI: Richard Andrews is a 62 year old male who presented to [facility] ED on 09/24/2022 after being found down at home by family members. He could recall that he fell onto his right side. Endorses ongoing alcohol use of approximately 1-1/2 pints of whiskey daily. He and family also reported previous fall approximately one month ago and had a significant left forehead bruising and hematoma along with the superficial laceration. He did not seek medical care at that time. 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 Shoulder films consistent with proximal humerus fracture. Labs showed hypokalemia to 3.1, mildly elevated transaminases, mild anemia, thrombocytopenia. Orthopedic surgery and neurosurgery consulted.  Transferred to [facility]. He underwent RORIF of right humerus on 4/16 by Dr. Dion Saucier. He is non-weight bearing and in a sling. Started on oral Librium for CIWA protocol and weaned. He then underwent right MMA embolization by Dr. Conchita Paris on 4/19. He remained hemodynamically stable without neurologic symptoms. Started on Lovenox for DVT prophylaxis. Tolerating diet. SLP eval  4/26 for ongoing cognitive-linguistic therapy. HE exhibited good attention throughout today's session. Exhibited good eye contact.  He participated in therapy tasks.  He was able to converse with therapist and share some details about his day including plant to d/c to CIR this date. He had difficulty recalling specific details about CIR (e.g. estimated LOS, rehab services recommended). Continues with s/sx mild-moderate dysarthria c/b low volume and monotone. Minimally articulatory imprecision noted.   Follow-up labs with normalization of serum potassium and transaminases; pancytopenia (plt count 84k). The patient requires inpatient medicine  and rehabilitation evaluations and services for ongoing dysfunction secondary to traumatic SDH and right humerus fracture.      PMH: EGD 2022 non-bleeding gastric ulcers, ORIF right wrist 2021, ORIF left wrist 2016 ROS     Past Medical History:  Diagnosis Date   Allergy     Distal radius fracture, left     Hypertension           Past Surgical History:  Procedure Laterality Date   BIOPSY   01/22/2021    Procedure: BIOPSY;  Surgeon: Shellia Cleverly, DO;  Location: [facility];  Service: Gastroenterology;;   ESOPHAGOGASTRODUODENOSCOPY (EGD) WITH PROPOFOL N/A 01/22/2021    Procedure: ESOPHAGOGASTRODUODENOSCOPY (EGD) WITH PROPOFOL;  Surgeon: Shellia Cleverly, DO;  Location: [facility];  Service: Gastroenterology;  Laterality: N/A;   I & D EXTREMITY Right 01/10/2020    Procedure: ORIF RIGHT WRIST;  Surgeon: Dominica Severin, MD;  Location: [facility] OR;  Service: Orthopedics;  Laterality: Right;   IR ANGIO EXTERNAL CAROTID SEL EXT CAROTID UNI R MOD SED   10/04/2022   IR ANGIO INTRA EXTRACRAN SEL INTERNAL CAROTID UNI R MOD SED   09/30/2022   IR ANGIOGRAM FOLLOW UP STUDY   09/30/2022   IR NEURO EACH ADD'L AFTER BASIC UNI RIGHT (MS)   10/04/2022   IR TRANSCATH/EMBOLIZ   09/30/2022   OPEN REDUCTION INTERNAL FIXATION (ORIF) DISTAL RADIAL FRACTURE Left 01/28/2015    Procedure: OPEN REDUCTION INTERNAL FIXATION (ORIF) LEFT DISTAL RADIAL FRACTURE;  Surgeon: Tarry Kos, MD;  Location: [facility];  Service: Orthopedics;  Laterality: Left;   ORIF HUMERUS FRACTURE Right 09/27/2022    Procedure: OPEN REDUCTION INTERNAL FIXATION (ORIF) PROXIMAL HUMERUS FRACTURE;  Surgeon: Teryl Lucy, MD;  Location: [facility] OR;  Service: Orthopedics;  Laterality: Right;   RADIOLOGY WITH ANESTHESIA N/A 09/30/2022    Procedure: IR WITH ANESTHESIA MMA EMBOLIZATION;  Surgeon: Radiologist, Medication, MD;  Location: [facility] OR;  Service: Radiology;  Laterality: N/A;   TONSILLECTOMY             Family History  Problem  Relation Age of Onset   Heart disease Mother     Hyperlipidemia Mother     Skin cancer Mother     Cancer Father     Multiple myeloma Sister     Throat cancer Sister      Social History:  reports that he has been smoking cigarettes. He has a 10.00 pack-year smoking history. His smokeless tobacco use includes chew. He reports current alcohol use of about 16.0 - 20.0 standard drinks of alcohol per week. He reports that he does not use drugs. Allergies: No Known Allergies       Medications Prior to Admission  Medication Sig Dispense Refill   calcitonin, salmon, (MIACALCIN) 200 UNIT/ACT nasal spray Place 1 spray into alternate nostrils daily. (Patient not taking: Reported on 12/11/2021) 3.7 mL 11   [employer] VITAMIN B12 1000 MCG tablet TAKE 1 TABLET BY MOUTH EVERY DAY (Patient not taking: Reported on 12/11/2021) 30 tablet 11   cyclobenzaprine (FLEXERIL) 10 MG tablet Take 1 tablet (10 mg total) by mouth 3 (three) times daily as needed for muscle spasms. (Patient not taking: Reported on 12/11/2021) 60 tablet 0   folic acid (FOLVITE) 1 MG tablet Take 1 tablet (1 mg total) by mouth daily. (Patient not taking: Reported on 12/11/2021) 30 tablet 1   gabapentin (NEURONTIN) 400 MG capsule TAKE 3 CAPSULES (1,200 MG TOTAL) BY MOUTH 3 (THREE) TIMES DAILY. (Patient not taking: Reported on 12/11/2021) 270 capsule 1   lisinopril (ZESTRIL) 20 MG tablet Take 1 tablet (20 mg total) by mouth daily. 90 tablet 3   pantoprazole (PROTONIX) 40 MG tablet Take 1 tablet (40 mg total) by mouth 2 (two) times daily. (Patient not taking: Reported on 12/11/2021) 60 tablet 1   sucralfate (CARAFATE) 1 g tablet Take 1 tablet (1 g total) by mouth 4 (four) times daily. 360 tablet 0   thiamine (VITAMIN B-1) 100 MG tablet Take 1 tablet (100 mg total) by mouth daily. (Patient not taking: Reported on 12/11/2021) 90 tablet 3   triamcinolone cream (KENALOG) 0.5 % Apply 1 application topically 2 (two) times daily. To affected areas. (Patient not taking: Reported  on 12/11/2021) 30 g 3          Home: Home Living Family/patient expects to be discharged to:: Private residence Living Arrangements: Other (Comment) (brother-in-law and sometimes his son comes and stays) Available Help at Discharge: Available 24 hours/day (brother-in-law is disabled but doesn't use assistive device.  Pt reports he can provide assist) Type of Home: House Home Access: Ramped entrance Home Layout: Two level, 1/2 bath on main level Alternate Level Stairs-Number of Steps: approximately 14 Alternate Level Stairs-Rails: Right, Left, Can reach both Bathroom Shower/Tub: Tub/shower unit Bathroom Toilet: Handicapped height Bathroom Accessibility: Yes Home Equipment: Agricultural consultant (2 wheels) Additional Comments: RW was his wife's mothers walker   Functional History: Prior Function Prior Level of Function : Independent/Modified Independent, Working/employed Mobility Comments: pt reports no issues, but reports his legs are weak normally ADLs Comments: independent, working as a Field seismologist Status:  Mobility: Bed Mobility Overal bed mobility: Needs Assistance Bed Mobility: Supine to Sit Supine to sit: Min assist Sit to supine:  Min assist General bed mobility comments: HOB flattened, no rail; incr time and effort and cues not to push through Rt elbow (in sling) Transfers Overall transfer level: Needs assistance Equipment used: 1 person hand held assist Transfers: Sit to/from Stand, Bed to chair/wheelchair/BSC Sit to Stand: Mod assist Bed to/from chair/wheelchair/BSC transfer type:: Step pivot Step pivot transfers: Mod assist General transfer comment: Increased posterior and left LOB with mobility. Ambulation/Gait Ambulation/Gait assistance: Max assist Gait Distance (Feet): 12 Feet Assistive device: 1 person hand held assist Gait Pattern/deviations: Step-to pattern, Decreased step length - left, Shuffle, Decreased stance time -  right, Decreased dorsiflexion - left, Decreased weight shift to right, Decreased weight shift to left, Wide base of support General Gait Details: pt with extrememly short step length and height with very wide BOS and decr lateral wt-shifts over each LE. Assist with weight-shifting at times effective in improving step clearance and length, however pt begins to guard and resist assist for wt-shifting Gait velocity: decreased Gait velocity interpretation: <1.31 ft/sec, indicative of household ambulator   ADL: ADL Overall ADL's : Needs assistance/impaired Eating/Feeding: Set up, Sitting Eating/Feeding Details (indicate cue type and reason): using LUE only Grooming: Oral care, Standing, Moderate assistance Upper Body Bathing: Moderate assistance, Sitting Upper Body Bathing Details (indicate cue type and reason): following AROM restrictions Lower Body Bathing: Moderate assistance, Sit to/from stand Lower Body Bathing Details (indicate cue type and reason): simulated Upper Body Dressing : Maximal assistance, Sitting Upper Body Dressing Details (indicate cue type and reason): to donn sling and gown Lower Body Dressing: Moderate assistance, Sit to/from stand Lower Body Dressing Details (indicate cue type and reason): gripper socks Toilet Transfer: Moderate assistance, Ambulation Toilet Transfer Details (indicate cue type and reason): Simulated with hand held assist to the bedside recliner.  Pt declined need to toilet Toileting- Clothing Manipulation and Hygiene: Moderate assistance, Sit to/from stand Toileting - Clothing Manipulation Details (indicate cue type and reason): simulated Functional mobility during ADLs: Moderate assistance (hand held assist on the left side) General ADL Comments: Min assist needed to complete 2 sets of 10 reps elbow flexion/extension with the RUE.  Max assist then needed to donn new gown and clean sling, as NT had just finished bathing and dressing patient when therapist  entered.  Pt exhibits increased motor planning difficulty for oral hygiene in standing when attempting to pick up and open toothpaste as well as with brushing with the LUE.  Increased posterior LOB in standing while completing task.   Cognition: Cognition Overall Cognitive Status: Impaired/Different from baseline Orientation Level: Oriented to person, Oriented to time, Disoriented to place, Disoriented to situation Year: 2024 Month: April Day of Week: Incorrect Attention: Focused, Sustained Focused Attention: Impaired Focused Attention Impairment: Verbal basic Sustained Attention: Impaired Sustained Attention Impairment: Verbal basic Memory: Impaired Memory Impairment: Decreased short term memory Awareness: Impaired Problem Solving: Impaired Executive Function: Reasoning Reasoning: Impaired Cognition Arousal/Alertness: Awake/alert Behavior During Therapy: Flat affect Overall Cognitive Status: Impaired/Different from baseline Area of Impairment: Orientation, Memory, Awareness, Safety/judgement, Following commands Orientation Level: Disoriented to, Time (day of week) Current Attention Level: Sustained Memory: Decreased short-term memory, Decreased recall of precautions Following Commands: Follows multi-step commands with increased time Safety/Judgement: Decreased awareness of safety, Decreased awareness of deficits Awareness: Intellectual Problem Solving: Difficulty sequencing, Requires verbal cues, Requires tactile cues, Slow processing General Comments: pt asleep upon arrival, does awake to stimulation and conversation. flat affect and at times mumbles/garbles speech. pt needs repeated cues for technique for all movment with poor spatial awareness  Difficult to assess due to:  (dysarthric)   Physical Exam: Blood pressure 110/86, pulse 63, temperature 97.7 F (36.5 C), temperature source Oral, resp. rate 17, height 6' (1.829 m), weight 66.3 kg, SpO2 97 %. Physical  Exam  Constitutional: No apparent distress. Appropriate appearance for age. + Disheveled appearance HENT: No JVD. Neck Supple. Trachea midline. Atraumatic, normocephalic. Eyes: PERRLA. EOMI. Visual fields grossly intact.  Nystagmus on left greater than right horizontal gaze Cardiovascular: RRR, no murmurs/rub/gallops. No Edema. Peripheral pulses 2+.  Right upper extremity capillary refill brisk. Respiratory: CTAB. No rales, rhonchi, or wheezing. On RA.  Abdomen: + bowel sounds, normoactive. No distention or tenderness.  GU: Not examined. + External Foley, draining clear but dark urine.  Skin: C/D/I. No apparent lesions. MSK:      Right upper extremity sling      Strength:                RUE: Grip, finger abduction 5 out of 5                LUE: 5/5 SA, 5/5 EF, 5/5 EE, 5/5 WE, 5/5 FF, 5/5 FA                 RLE: 5/5 HF, 5/5 KE, 5/5 DF, 5/5 EHL, 5/5 PF                 LLE:  5/5 HF, 5/5 KE, 5/5 DF, 5/5 EHL, 5/5 PF   Neurologic exam:  Cognition: AAO to person, place; not time Language: Fluent, in short phrases.  + Dysarthria and occasional  neoglisms.Names 2/3 objects correctly.  Memory: Recalls 1/3 objects at 5 minutes.  Insight: Poor insight into current condition.  Mood: Flat affect, appropriate mood.  Sensation: To light touch intact in BL UEs and LEs  Reflexes: 2+ in BL UE and LEs. Negative Hoffman's and babinski signs bilaterally.  CN: Questionable mild left facial droop; otherwise intact Coordination: + ataxia on FTN, HTS bilaterally.  Spasticity: MAS 0 in all extremities.       Lab Results Last 48 Hours  No results found for this or any previous visit (from the past 48 hour(s)).   Imaging Results (Last 48 hours)  No results found.         Blood pressure 110/86, pulse 63, temperature 97.7 F (36.5 C), temperature source Oral, resp. rate 17, height 6' (1.829 m), weight 66.3 kg, SpO2 97 %.   Medical Problem List and Plan: 1. Functional deficits secondary to traumatic SDH  status post MMA embolization             -patient may shower             -ELOS/Goals: 16-18 days, Min A PT/OT/SLP   2.  Antithrombotics: -DVT/anticoagulation:  Pharmaceutical: Lovenox 30 mg BID continued             -antiplatelet therapy: none   3. Pain Management: Avoid Tylenol; oxycodone as needed   4. Mood/Behavior/Sleep: LCSW to evaluate and provide emotional support             -antipsychotic agents: n/a   5. Neuropsych/cognition: This patient is not capable of making decisions on his own behalf.   6. Skin/Wound Care: Routine skin care checks   7. Fluids/Electrolytes/Nutrition: Routine Is and Os and follow-up chemistries             -continue thiamine and folic acid supplements             -  check B12 level   8: Alcoholism: on Librium taper   9: History of gastric ulcers, non-bleeding: 2021             -continue PPI while hospitalized   10: Hypertension: Monitor TID and prn; has been on lisinopril 10 mg in the past (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 Conchita Paris   12: Right proximal humerus s/p ORIF 4/16              -NWB RUE, ok for AROM at elbow, wrist, and fingers             -keep in sling             -follow-up with Dr. Dion Saucier   13: Macrocytic anemia: update B12 levels (on thiamine and folic acid); follow-up CBC   14: Thrombocytopenia: follow-up CBC    Milinda Antis, PA-C 10/07/2022  I have examined the patient independently and edited the note for HPI, ROS, exam, assessment, and plan as appropriate. I am in agreement with the above recommendations.   Angelina Sheriff, DO 10/07/2022

## 2022-10-07 NOTE — Progress Notes (Signed)
Inpatient Rehabilitation  Medication Review by a Pharmacist  A complete drug regimen review was completed for this patient to identify any potential clinically significant medication issues.  High Risk Drug Classes Is patient taking? Indication by Medication  Antipsychotic No   Anticoagulant Yes Enoxaparin- VTE prophylaxis  Antibiotic No   Opioid Yes Oxycodone- pain management   Antiplatelet No   Hypoglycemics/insulin No   Vasoactive Medication No   Chemotherapy No   Other Yes Folic acid, thiamine- supplements Methocarbamol- muscle spasms Protonix -history of gastric ulcers. Trazodone-insomnia     Type of Medication Issue Identified Description of Issue Recommendation(s)  Drug Interaction(s) (clinically significant)     Duplicate Therapy     Allergy     No Medication Administration End Date     Incorrect Dose     Additional Drug Therapy Needed     Significant med changes from prior encounter (inform family/care partners about these prior to discharge).    Other  PTA meds patient reported on [facility] acute admission that he was NOT TAKING:  Lisinopril, sucralfate, gabapentin and Calcitonin nasal spray.   Restart PTA meds when and if necessary during CIR admission or at time of discharge, if warranted.  CIR team to review medications with paitient prior to discharge from CIR.     Clinically significant medication issues were identified that warrant physician communication and completion of prescribed/recommended actions by midnight of the next day:  No  Name of provider notified for urgent issues identified:   Provider Method of Notification:     Pharmacist comments:   Time spent performing this drug regimen review (minutes):  15   Thank you for allowing pharmacy to be part of this patients care team. Noah Delaine, RPh Clinical Pharmacist 10/07/2022 10:14 PM

## 2022-10-07 NOTE — Progress Notes (Signed)
Speech Language Pathology Treatment: Cognitive-Linquistic  Patient Details Name: Richard Andrews MRN: [id number] DOB: 1961/01/23 Today's Date: 10/07/2022 Time: 8295-6213 SLP Time Calculation (min) (ACUTE ONLY): 18 min  Assessment / Plan / Recommendation Clinical Impression  Pt seen for ongoing cognitive-linguistic therapy. Pt exhibited good attention throughout today's session. Pt with good eye contact.  He participated in therapy tasks.  He was able to converse with therapist and share some details about his day including plant to d/c to CIR this date. Pt with difficulty recalling specific details about CIR (e.g. estimated LOS, rehab services recommended).    Pt's continues with s/sx mild-moderate dysarthria c/b low volume and monotone. Minimally articulatory imprecision noted.  Compensatory strategies reviewed, and pt benefited from min/mod verbal cues for increased vocal loudness. Pt also benefited from repositioning assistance.   Pt completed confrontational naming task with 100% with extra time.  Pt did not demonstrate word finding difficulty in conversation. He feels that he is at his linguistic baseline; ?benefit to higher level testing at next level of care.   Noted pt with decreased safety awareness with pt stating that he could d/c home without assistance.   Recommend continuing intervention for dysarthria and cognition.  SLP will follow in house.  Recommend continued ST at next level of care.   HPI HPI: Richard Andrews is a 62 y.o. male who was brought to the [facility] emergency department after a fall. He was found to have humerus fracture, now s/p ORIF.  Head CT revealed "Large (2.0 cm thick) mixed density right cerebral convexity subdural hemorrhage with hyperdense component posteriorly compatible with acute/recent hemorrhage. Trace leftward midline shift." This is believed to be from a fall a couple of weeks ago when pt hit head.  Pt now s/p MMA embloziation with neurosurgery. Pt with  history of hypertension and alcohol abuse.      SLP Plan  Continue with current plan of care      Recommendations for follow up therapy are one component of a multi-disciplinary discharge planning process, led by the attending physician.  Recommendations may be updated based on patient status, additional functional criteria and insurance authorization.    Recommendations              Intermittent Supervision/Assistance Cognitive communication deficit (R41.841);Dysarthria and anarthria (R47.1);Aphasia (R47.01)     Continue with current plan of care    Clyde Canterbury, M.S., CCC-SLP Speech-Language Pathologist Secure Chat Preferred  O: [phone]  Woodroe Chen  10/07/2022, 1:53 PM

## 2022-10-07 NOTE — Progress Notes (Signed)
Occupational Therapy Treatment Patient Details Name: Richard Andrews MRN: [id number] DOB: 11/06/1960 Today's Date: 10/07/2022   History of present illness The pt is a 62 yo male presenting 4/13 after a fall onto R shoulder. +etoh. Imaging showed R proximal humerus fx and R subacute SDH. Pt with additional fall ~1 month ago for which he did not seek medical care, suspect SDH from prior fall. S/p ORIF R proximal humerus 4/16. MMA embolization 4/19. PMH includes: HTN, L wrist fx.   OT comments  Pt currently mod assist for functional transfers with hand held assist on the right and for oral hygiene in standing secondary to decreased balance and decreased motor planning with use of the LUE.  He need max assist for donning sling with min assist to complete right elbow flexion/extension AAROM exercises.  Still with decreased memory, awareness, and safety.  Will continue to benefit from acute care OT at this time.    Recommendations for follow up therapy are one component of a multi-disciplinary discharge planning process, led by the attending physician.  Recommendations may be updated based on patient status, additional functional criteria and insurance authorization.    Assistance Recommended at Discharge Frequent or constant Supervision/Assistance  Patient can return home with the following  A lot of help with bathing/dressing/bathroom;Assist for transportation;Help with stairs or ramp for entrance;Direct supervision/assist for medications management;Assistance with cooking/housework;Direct supervision/assist for financial management;A little help with walking and/or transfers   Equipment Recommendations  Other (comment) (TBD next venue of care)       Precautions / Restrictions Precautions Precautions: Fall;Shoulder Shoulder Interventions: Shoulder sling/immobilizer;At all times;Off for dressing/bathing/exercises Precaution Comments: OK for AROM at elbow, wrist, and digits only Required Braces or  Orthoses: Sling Restrictions Weight Bearing Restrictions: Yes RUE Weight Bearing: Non weight bearing       Mobility Bed Mobility Overal bed mobility: Needs Assistance Bed Mobility: Supine to Sit     Supine to sit: Min assist          Transfers Overall transfer level: Needs assistance Equipment used: 1 person hand held assist Transfers: Sit to/from Stand, Bed to chair/wheelchair/BSC Sit to Stand: Mod assist     Step pivot transfers: Mod assist     General transfer comment: Increased posterior and left LOB with mobility.     Balance Overall balance assessment: Needs assistance Sitting-balance support: Feet supported Sitting balance-Leahy Scale: Fair     Standing balance support: Single extremity supported, During functional activity Standing balance-Leahy Scale: Poor Standing balance comment: Posterior lean and LOB                           ADL either performed or assessed with clinical judgement   ADL Overall ADL's : Needs assistance/impaired     Grooming: Oral care;Standing;Moderate assistance           Upper Body Dressing : Maximal assistance;Sitting Upper Body Dressing Details (indicate cue type and reason): to donn sling and gown     Toilet Transfer: Moderate assistance;Ambulation Toilet Transfer Details (indicate cue type and reason): Simulated with hand held assist to the bedside recliner.  Pt declined need to toilet Toileting- Clothing Manipulation and Hygiene: Moderate assistance;Sit to/from stand Toileting - Clothing Manipulation Details (indicate cue type and reason): simulated     Functional mobility during ADLs: Moderate assistance (hand held assist on the left side) General ADL Comments: Min assist needed to complete 2 sets of 10 reps elbow flexion/extension with the RUE.  Max assist then needed to donn new gown and clean sling, as NT had just finished bathing and dressing patient when therapist entered.  Pt exhibits increased  motor planning difficulty for oral hygiene in standing when attempting to pick up and open toothpaste as well as with brushing with the LUE.  Increased posterior LOB in standing while completing task.      Cognition Arousal/Alertness: Awake/alert Behavior During Therapy: Flat affect Overall Cognitive Status: Impaired/Different from baseline Area of Impairment: Orientation, Memory, Awareness, Safety/judgement, Following commands                 Orientation Level: Disoriented to, Time (day of week) Current Attention Level: Sustained Memory: Decreased short-term memory, Decreased recall of precautions Following Commands: Follows multi-step commands with increased time Safety/Judgement: Decreased awareness of safety, Decreased awareness of deficits Awareness: Intellectual Problem Solving: Difficulty sequencing, Requires verbal cues, Requires tactile cues, Slow processing                     Pertinent Vitals/ Pain       Pain Assessment Pain Assessment: Faces Faces Pain Scale: No hurt         Frequency  Min 2X/week        Progress Toward Goals  OT Goals(current goals can now be found in the care plan section)  Progress towards OT goals: Progressing toward goals  Acute Rehab OT Goals OT Goal Formulation: With patient Time For Goal Achievement: 10/12/22 Potential to Achieve Goals: Fair  Plan Discharge plan remains appropriate       AM-PAC OT "6 Clicks" Daily Activity     Outcome Measure   Help from another person eating meals?: A Lot Help from another person taking care of personal grooming?: A Lot Help from another person toileting, which includes using toliet, bedpan, or urinal?: A Lot Help from another person bathing (including washing, rinsing, drying)?: A Lot Help from another person to put on and taking off regular upper body clothing?: A Lot Help from another person to put on and taking off regular lower body clothing?: A Lot 6 Click Score: 12     End of Session Equipment Utilized During Treatment: Gait belt;Other (comment) (sling)  OT Visit Diagnosis: Unsteadiness on feet (R26.81);Other abnormalities of gait and mobility (R26.89);Repeated falls (R29.6);Muscle weakness (generalized) (M62.81);Pain;Other symptoms and signs involving cognitive function Pain - Right/Left: Right Pain - part of body: Shoulder   Activity Tolerance Patient tolerated treatment well   Patient Left in chair;with call bell/phone within reach;with chair alarm set;with family/visitor present   Nurse Communication Mobility status        Time: 0950-1040 OT Time Calculation (min): 50 min  Charges: OT General Charges $OT Visit: 1 Visit OT Treatments $Self Care/Home Management : 38-52 mins  Perrin Maltese, OTR/L Acute Rehabilitation Services  Office [phone] 10/07/2022

## 2022-10-07 NOTE — Progress Notes (Signed)
Inpatient Rehab Admissions Coordinator:    I continue to await insurance auth for CIR. [employer] made an error processing case, resulting in a processing delay. I have resent all requested materials and await a decision.I will continue to follow for potential admit pending insurance auth.   Megan Salon, MS, CCC-SLP Rehab Admissions Coordinator  [phone] (celll) [phone] (office)

## 2022-10-07 NOTE — Progress Notes (Signed)
Report given to nurse on and wife notified of transfer.

## 2022-10-08 DIAGNOSIS — M25511 Pain in right shoulder: Secondary | ICD-10-CM

## 2022-10-08 DIAGNOSIS — K5901 Slow transit constipation: Secondary | ICD-10-CM

## 2022-10-08 DIAGNOSIS — S065XAA Traumatic subdural hemorrhage with loss of consciousness status unknown, initial encounter: Secondary | ICD-10-CM | POA: Diagnosis not present

## 2022-10-08 LAB — CBC WITH DIFFERENTIAL/PLATELET
Abs Immature Granulocytes: 0.03 10*3/uL (ref 0.00–0.07)
Basophils Absolute: 0 10*3/uL (ref 0.0–0.1)
Basophils Relative: 1 %
Eosinophils Absolute: 0.2 10*3/uL (ref 0.0–0.5)
Eosinophils Relative: 5 %
HCT: 31.8 % — ABNORMAL LOW (ref 39.0–52.0)
Hemoglobin: 10.8 g/dL — ABNORMAL LOW (ref 13.0–17.0)
Immature Granulocytes: 1 %
Lymphocytes Relative: 25 %
Lymphs Abs: 0.9 10*3/uL (ref 0.7–4.0)
MCH: 35 pg — ABNORMAL HIGH (ref 26.0–34.0)
MCHC: 34 g/dL (ref 30.0–36.0)
MCV: 102.9 fL — ABNORMAL HIGH (ref 80.0–100.0)
Monocytes Absolute: 0.7 10*3/uL (ref 0.1–1.0)
Monocytes Relative: 18 %
Neutro Abs: 1.9 10*3/uL (ref 1.7–7.7)
Neutrophils Relative %: 50 %
Platelets: 210 10*3/uL (ref 150–400)
RBC: 3.09 MIL/uL — ABNORMAL LOW (ref 4.22–5.81)
RDW: 12.5 % (ref 11.5–15.5)
WBC: 3.8 10*3/uL — ABNORMAL LOW (ref 4.0–10.5)
nRBC: 0 % (ref 0.0–0.2)

## 2022-10-08 LAB — COMPREHENSIVE METABOLIC PANEL
ALT: 29 U/L (ref 0–44)
AST: 41 U/L (ref 15–41)
Albumin: 3.5 g/dL (ref 3.5–5.0)
Alkaline Phosphatase: 111 U/L (ref 38–126)
Anion gap: 9 (ref 5–15)
BUN: 16 mg/dL (ref 8–23)
CO2: 23 mmol/L (ref 22–32)
Calcium: 9.5 mg/dL (ref 8.9–10.3)
Chloride: 102 mmol/L (ref 98–111)
Creatinine, Ser: 0.99 mg/dL (ref 0.61–1.24)
GFR, Estimated: >60 mL/min (ref >60)
Glucose, Bld: 99 mg/dL (ref 70–99)
Potassium: 3.7 mmol/L (ref 3.5–5.1)
Sodium: 134 mmol/L — ABNORMAL LOW (ref 135–145)
Total Bilirubin: 1.5 mg/dL — ABNORMAL HIGH (ref 0.3–1.2)
Total Protein: 7.6 g/dL (ref 6.5–8.1)

## 2022-10-08 LAB — VITAMIN B12: Vitamin B-12: 865 pg/mL (ref 180–914)

## 2022-10-08 MED ORDER — SORBITOL 70 % SOLN
60.0000 mL | Freq: Once | Status: AC
  Administered 2022-10-08: 60 mL via ORAL
  Filled 2022-10-08: qty 60

## 2022-10-08 NOTE — Progress Notes (Signed)
Pt appeared to be confused and tossed all his linen on the ground. Pt also got up and walked into the bathroom despite bed alarm warning. Nurse entered the room to find patient in the bathroom attempting to urinate. Nurse helped patient in the bathroom back to the bed. Bed alarm on; 3x Railings up; call light within reach, patient stated no needs at this time. Care ongoing.    Rito Ehrlich, LPN

## 2022-10-08 NOTE — Evaluation (Signed)
Speech Language Pathology Assessment and Plan  Patient Details  Name: Richard Andrews MRN: [id number] Date of Birth: 1961/03/24  SLP Diagnosis: Dysarthria;Cognitive Impairments  Rehab Potential: Good ELOS: 16-18 days   Today's Date: 10/08/2022 SLP Individual Time: 4098-1191 SLP Individual Time Calculation (min): 59 min  Hospital Problem: Principal Problem:   Subdural hematoma (HCC)  Past Medical History:  Past Medical History:  Diagnosis Date   Allergy    Distal radius fracture, left    Hypertension    Past Surgical History:  Past Surgical History:  Procedure Laterality Date   BIOPSY  01/22/2021   Procedure: BIOPSY;  Surgeon: Shellia Cleverly, DO;  Location: [facility];  Service: Gastroenterology;;   ESOPHAGOGASTRODUODENOSCOPY (EGD) WITH PROPOFOL N/A 01/22/2021   Procedure: ESOPHAGOGASTRODUODENOSCOPY (EGD) WITH PROPOFOL;  Surgeon: Shellia Cleverly, DO;  Location: [facility];  Service: Gastroenterology;  Laterality: N/A;   I & D EXTREMITY Right 01/10/2020   Procedure: ORIF RIGHT WRIST;  Surgeon: Dominica Severin, MD;  Location: [facility];  Service: Orthopedics;  Laterality: Right;   IR ANGIO EXTERNAL CAROTID SEL EXT CAROTID UNI R MOD SED  10/04/2022   IR ANGIO INTRA EXTRACRAN SEL INTERNAL CAROTID UNI R MOD SED  09/30/2022   IR ANGIOGRAM FOLLOW UP STUDY  09/30/2022   IR NEURO EACH ADD'L AFTER BASIC UNI RIGHT (MS)  10/04/2022   IR TRANSCATH/EMBOLIZ  09/30/2022   OPEN REDUCTION INTERNAL FIXATION (ORIF) DISTAL RADIAL FRACTURE Left 01/28/2015   Procedure: OPEN REDUCTION INTERNAL FIXATION (ORIF) LEFT DISTAL RADIAL FRACTURE;  Surgeon: Tarry Kos, MD;  Location: [facility];  Service: Orthopedics;  Laterality: Left;   ORIF HUMERUS FRACTURE Right 09/27/2022   Procedure: OPEN REDUCTION INTERNAL FIXATION (ORIF) PROXIMAL HUMERUS FRACTURE;  Surgeon: Teryl Lucy, MD;  Location: [facility];  Service: Orthopedics;  Laterality: Right;   RADIOLOGY WITH ANESTHESIA N/A 09/30/2022   Procedure:  IR WITH ANESTHESIA MMA EMBOLIZATION;  Surgeon: Radiologist, Medication, MD;  Location: [facility];  Service: Radiology;  Laterality: N/A;   TONSILLECTOMY      Assessment / Plan / Recommendation Clinical Impression  Richard Andrews is a 62 y.o. male who was brought to the [facility] emergency department after a fall. He was found to have humerus fracture, now s/p ORIF. Head CT revealed "Large (2.0 cm thick) mixed density right cerebral convexity subdural hemorrhage with hyperdense component posteriorly compatible with acute/recent hemorrhage. Trace leftward midline shift." This is believed to be from a fall a couple of weeks ago when pt hit head. Pt now s/p MMA embloziation with neurosurgery. Pt with history of hypertension and alcohol abuse. Pt transferred to CIR 10/07/2022.  Pt presents with moderately severe cognitive impairment as evidenced by a SLUMS score of 9/30. Pt with most notable deficits in orientation, recall, simple calculations, verbal fluency and executive function. Pt with significant deficits in attention and following simple directions impacting structured tasks and assessment. Pt disoriented to age and DOB, stating "[birth year]" when asked his birthday. During animal naming task, pt able to state 8 animals before saying "that's all the football teams I know". Pt clock only with numbers "1, 3, 4, 5, 6" with inaccurate spacing. Pt with poor awareness of rational for hospitalization, CIR admission, current precautions and cognitive + physical deficits, all impacting patient's safety at this time.   Pt demonstrates mild-moderate dysarthria characterized by low vocal volume, monotone, and imprecise articulation impacting overall intelligibility at word, phrase and sentence level. Pt benefits from frequent verbal cues to increase vocal intensity  and over-articulate. "A BOSS" speech intelligibility strategies hung in patient's room to attempt to increase awareness and carryover. Pt will benefit from  skilled ST during CIR to increase safety and independence with daily living tasks and increase speech intelligibility.    Skilled Therapeutic Interventions          Pt participating in SLUMS examinations as well as further nonstandardized assessments of speech, language and cognition. Please see above.    SLP Assessment  Patient will need skilled Speech Lanaguage Pathology Services during CIR admission    Recommendations  SLP Diet Recommendations: Age appropriate regular solids;Thin Medication Administration: Whole meds with liquid Supervision: Patient able to self feed Compensations: Minimize environmental distractions Postural Changes and/or Swallow Maneuvers: Seated upright 90 degrees Oral Care Recommendations: Oral care BID Patient destination: Home Follow up Recommendations: 24 hour supervision/assistance;Home Health SLP Equipment Recommended: To be determined    SLP Frequency 1 to 3 out of 7 days   SLP Duration  SLP Intensity  SLP Treatment/Interventions 16-18 days  Minumum of 1-2 x/day, 30 to 90 minutes  Cognitive remediation/compensation;Internal/external aids;Therapeutic Exercise;Therapeutic Activities;Patient/family education;Functional tasks;Cueing hierarchy    Pain Pain Assessment Pain Scale: 0-10 Pain Score: 0-No pain  Prior Functioning Cognitive/Linguistic Baseline: Information not available Type of Home: House  Lives With: Other (Comment) Available Help at Discharge: Available 24 hours/day (brother in law)  SLP Evaluation Cognition Overall Cognitive Status: Impaired/Different from baseline Arousal/Alertness: Awake/alert Orientation Level: Oriented to person;Disoriented to place;Oriented to situation;Disoriented to time Year: 2025 Month: April Day of Week: Incorrect Attention: Focused;Sustained Focused Attention: Impaired Focused Attention Impairment: Verbal basic;Functional basic Sustained Attention: Impaired Sustained Attention Impairment: Verbal  basic;Functional basic Memory: Impaired Memory Impairment: Decreased short term memory;Decreased recall of new information Decreased Short Term Memory: Verbal basic Awareness: Impaired Awareness Impairment: Intellectual impairment Problem Solving: Impaired Problem Solving Impairment: Verbal basic;Functional basic Executive Function: Self Monitoring;Initiating Reasoning: Impaired Initiating: Impaired Self Monitoring: Impaired Safety/Judgment: Impaired  Comprehension Auditory Comprehension Overall Auditory Comprehension: Impaired Yes/No Questions: Within Functional Limits Commands: Impaired Two Step Basic Commands: 50-74% accurate Conversation: Simple Interfering Components: Attention;Processing speed EffectiveTechniques: Extra processing time;Increased volume;Repetition Visual Recognition/Discrimination Discrimination: Not tested Reading Comprehension Reading Status: Not tested Expression Expression Primary Mode of Expression: Verbal Verbal Expression Overall Verbal Expression: Impaired Repetition: Impaired Level of Impairment: Sentence level Naming: Impairment Confrontation: Impaired Divergent: 25-49% accurate Verbal Errors: Semantic paraphasias;Phonemic paraphasias Written Expression Dominant Hand: Left Written Expression: Within Functional Limits Oral Motor Motor Speech Overall Motor Speech: Impaired Respiration: Within functional limits Phonation: Low vocal intensity Resonance: Within functional limits Articulation: Impaired Level of Impairment: Word Intelligibility: Intelligibility reduced Word: 75-100% accurate Phrase: 50-74% accurate Sentence: 50-74% accurate Effective Techniques: Increased vocal intensity;Over-articulate;Pacing  Care Tool Care Tool Cognition Ability to hear (with hearing aid or hearing appliances if normally used Ability to hear (with hearing aid or hearing appliances if normally used): 1. Minimal difficulty - difficulty in some  environments (e.g. when person speaks softly or setting is noisy)   Expression of Ideas and Wants Expression of Ideas and Wants: 2. Frequent difficulty - frequently exhibits difficulty with expressing needs and ideas   Understanding Verbal and Non-Verbal Content Understanding Verbal and Non-Verbal Content: 2. Sometimes understands - understands only basic conversations or simple, direct phrases. Frequently requires cues to understand  Memory/Recall Ability Memory/Recall Ability : That he or she is in a hospital/hospital unit   Intelligibility: Intelligibility reduced Word: 75-100% accurate Phrase: 50-74% accurate Sentence: 50-74% accurate   Short Term Goals: Week 1: SLP Short Term Goal 1 (  Week 1): Pt will increase orientation to time, place and recent medical events with min A verbal and visual cues SLP Short Term Goal 2 (Week 1): Pt will demonstrate sustained attention for 4-6 minutes during functional tasks provided mod A cues SLP Short Term Goal 3 (Week 1): Pt will utilize speech intelligibility strategies as trained with mod A cues for 75% intellibility at simple sentence level SLP Short Term Goal 4 (Week 1): Pt will follow 2-3 step directions during functional tasks with min A cues SLP Short Term Goal 5 (Week 1): Pt will ID 2 physical and 2 cognitive changes s/p fall/hospitalization with mod A cues SLP Short Term Goal 6 (Week 1): Pt will recall functional, novel information (weight bearing precautions, etc) with 50% accuracy provided mod A verbal cues  Refer to Care Plan for Long Term Goals  Recommendations for other services: None   Discharge Criteria: Patient will be discharged from SLP if patient refuses treatment 3 consecutive times without medical reason, if treatment goals not met, if there is a change in medical status, if patient makes no progress towards goals or if patient is discharged from hospital.  The above assessment, treatment plan, treatment alternatives and goals  were discussed and mutually agreed upon: by patient  Tacey Ruiz 10/08/2022, 11:01 AM

## 2022-10-08 NOTE — Evaluation (Signed)
Physical Therapy Assessment and Plan  Patient Details  Name: Richard Andrews MRN: [id number] Date of Birth: 1960-12-08  PT Diagnosis: Abnormality of gait, Coordination disorder, Difficulty walking, Impaired cognition, and Muscle weakness Rehab Potential: Good ELOS: 2 weeks   Today's Date: 10/08/2022 PT Individual Time: 1300-1415 PT Individual Time Calculation (min): 75 min    Hospital Problem: Principal Problem:   Subdural hematoma (HCC)   Past Medical History:  Past Medical History:  Diagnosis Date   Allergy    Distal radius fracture, left    Hypertension    Past Surgical History:  Past Surgical History:  Procedure Laterality Date   BIOPSY  01/22/2021   Procedure: BIOPSY;  Surgeon: Shellia Cleverly, DO;  Location: [facility];  Service: Gastroenterology;;   ESOPHAGOGASTRODUODENOSCOPY (EGD) WITH PROPOFOL N/A 01/22/2021   Procedure: ESOPHAGOGASTRODUODENOSCOPY (EGD) WITH PROPOFOL;  Surgeon: Shellia Cleverly, DO;  Location: [facility];  Service: Gastroenterology;  Laterality: N/A;   I & D EXTREMITY Right 01/10/2020   Procedure: ORIF RIGHT WRIST;  Surgeon: Dominica Severin, MD;  Location: [facility];  Service: Orthopedics;  Laterality: Right;   IR ANGIO EXTERNAL CAROTID SEL EXT CAROTID UNI R MOD SED  10/04/2022   IR ANGIO INTRA EXTRACRAN SEL INTERNAL CAROTID UNI R MOD SED  09/30/2022   IR ANGIOGRAM FOLLOW UP STUDY  09/30/2022   IR NEURO EACH ADD'L AFTER BASIC UNI RIGHT (MS)  10/04/2022   IR TRANSCATH/EMBOLIZ  09/30/2022   OPEN REDUCTION INTERNAL FIXATION (ORIF) DISTAL RADIAL FRACTURE Left 01/28/2015   Procedure: OPEN REDUCTION INTERNAL FIXATION (ORIF) LEFT DISTAL RADIAL FRACTURE;  Surgeon: Tarry Kos, MD;  Location: [facility];  Service: Orthopedics;  Laterality: Left;   ORIF HUMERUS FRACTURE Right 09/27/2022   Procedure: OPEN REDUCTION INTERNAL FIXATION (ORIF) PROXIMAL HUMERUS FRACTURE;  Surgeon: Teryl Lucy, MD;  Location: [facility];  Service: Orthopedics;  Laterality:  Right;   RADIOLOGY WITH ANESTHESIA N/A 09/30/2022   Procedure: IR WITH ANESTHESIA MMA EMBOLIZATION;  Surgeon: Radiologist, Medication, MD;  Location: [facility];  Service: Radiology;  Laterality: N/A;   TONSILLECTOMY      Assessment & Plan Clinical Impression: Richard Andrews is a 62 year old male who presented to [facility] ED on 09/24/2022 after being found down at home by family members. He could recall that he fell onto his right side. Endorses ongoing alcohol use of approximately 1-1/2 pints of whiskey daily. He and family also reported previous fall approximately one month ago and had a significant left forehead bruising and hematoma along with the superficial laceration. He did not seek medical care at that time. 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 Shoulder films consistent with proximal humerus fracture. Labs showed hypokalemia to 3.1, mildly elevated transaminases, mild anemia, thrombocytopenia. Orthopedic surgery and neurosurgery consulted. Transferred to [facility]. He underwent RORIF of right humerus on 4/16 by Dr. Dion Saucier. He is non-weight bearing and in a sling. Started on oral Librium for CIWA protocol and weaned. He then underwent right MMA embolization by Dr. Conchita Paris on 4/19. He remained hemodynamically stable without neurologic symptoms. Started on Lovenox for DVT prophylaxis.   Patient transferred to CIR on 10/07/2022 .   Patient currently requires mod with mobility secondary to muscle weakness, decreased cardiorespiratoy endurance, impaired timing and sequencing, unbalanced muscle activation, and decreased coordination, decreased awareness, decreased safety awareness, and decreased memory, and decreased standing balance, decreased postural control, and decreased balance strategies.  Prior to hospitalization, patient was modified  independent  with mobility and lived with Other (Comment) (brother in Social worker) in a House home.  Home  access is  Ramped entrance.  Patient will benefit from skilled PT intervention to maximize safe functional mobility, minimize fall risk, and decrease caregiver burden for planned discharge home with 24 hour supervision.  Anticipate patient will benefit from follow up HH at discharge.  PT - End of Session Activity Tolerance: Tolerates 30+ min activity with multiple rests Endurance Deficit: Yes PT Assessment Rehab Potential (ACUTE/IP ONLY): Good PT Barriers to Discharge: Inaccessible home environment;Decreased caregiver support;Home environment access/layout;Lack of/limited family support;Weight bearing restrictions;Behavior PT Patient demonstrates impairments in the following area(s): Balance;Pain;Behavior;Perception;Safety;Endurance;Motor;Skin Integrity PT Transfers Functional Problem(s): Bed Mobility;Bed to Chair;Car PT Locomotion Functional Problem(s): Ambulation;Wheelchair Mobility;Stairs PT Plan PT Intensity: Minimum of 1-2 x/day ,45 to 90 minutes PT Frequency: 5 out of 7 days PT Duration Estimated Length of Stay: 2 weeks PT Treatment/Interventions: Ambulation/gait training;Cognitive remediation/compensation;Discharge planning;DME/adaptive equipment instruction;Functional mobility training;Pain management;Psychosocial support;Splinting/orthotics;Therapeutic Activities;UE/LE Strength taining/ROM;Visual/perceptual remediation/compensation;Wheelchair propulsion/positioning;UE/LE Coordination activities;Therapeutic Exercise;Stair training;Skin care/wound management;Patient/family education;Neuromuscular re-education;Functional electrical stimulation;Disease management/prevention;Community reintegration;Balance/vestibular training PT Transfers Anticipated Outcome(s): Supervision with LRAD PT Locomotion Anticipated Outcome(s): Supervision with LRAD PT Recommendation Follow Up Recommendations: Outpatient PT Patient destination: Home Equipment Recommended: To be determined   PT  Evaluation Precautions/Restrictions Precautions Precautions: Fall;Shoulder Shoulder Interventions: Shoulder sling/immobilizer;At all times;Off for dressing/bathing/exercises Precaution Comments: OK for AROM at elbow, wrist, and digits only Required Braces or Orthoses: Sling Restrictions Weight Bearing Restrictions: Yes RUE Weight Bearing: Non weight bearing General   Vital Signs  Pain   Pain Interference Pain Interference Pain Effect on Sleep: 1. Rarely or not at all Pain Interference with Therapy Activities: 3. Frequently Pain Interference with Day-to-Day Activities: 3. Frequently Home Living/Prior Functioning Home Living Available Help at Discharge: Available 24 hours/day Type of Home: House Home Access: Ramped entrance Home Layout: Two level;1/2 bath on main level Alternate Level Stairs-Number of Steps: approximately 14 Alternate Level Stairs-Rails: Right;Left;Can reach both Additional Comments: RW was his wife's mothers walker  Lives With: Other (Comment) (brother in law) Prior Function Level of Independence: Independent with gait;Independent with transfers  Able to Take Stairs?: Yes Driving: Yes Vocation: Full time employment Vision/Perception  Vision - History Ability to See in Adequate Light: 1 Impaired Perception Perception: Within Functional Limits Praxis Praxis: Intact  Cognition Overall Cognitive Status: Impaired/Different from baseline Arousal/Alertness: Awake/alert Orientation Level: Oriented to person;Disoriented to place;Oriented to situation;Disoriented to time Day of Week: Incorrect Focused Attention: Impaired Memory: Impaired Memory Impairment: Decreased short term memory;Decreased recall of new information Awareness: Impaired Sensation Sensation Light Touch: Appears Intact Proprioception: Impaired by gross assessment Coordination Gross Motor Movements are Fluid and Coordinated: No Coordination and Movement Description: grossly  dyscoordinated Motor  Motor Motor: Abnormal postural alignment and control Motor - Skilled Clinical Observations: generalized weakness   Trunk/Postural Assessment  Cervical Assessment Cervical Assessment: Exceptions to [facility] Thoracic Assessment Thoracic Assessment: Exceptions to [facility] Lumbar Assessment Lumbar Assessment: Exceptions to [facility] Postural Control Postural Control: Deficits on evaluation  Balance Balance Balance Assessed: Yes Standardized Balance Assessment Standardized Balance Assessment: Berg Balance Test Berg Balance Test Sit to Stand: Needs minimal aid to stand or to stabilize Standing Unsupported: Able to stand 2 minutes with supervision Sitting with Back Unsupported but Feet Supported on Floor or Stool: Able to sit 2 minutes under supervision Stand to Sit: Uses backs of legs against chair to control descent Transfers: Needs one person to assist Standing Unsupported with Eyes Closed: Needs help to keep from falling (Stood 8  sec but lost balance requiring assist) Standing Ubsupported with Feet Together: Needs help to attain position and unable to hold for 15 seconds From Standing, Reach Forward with Outstretched Arm: Reaches forward but needs supervision From Standing Position, Pick up Object from Floor: Unable to try/needs assist to keep balance From Standing Position, Turn to Look Behind Over each Shoulder: Needs supervision when turning Turn 360 Degrees: Needs assistance while turning (Pt turned about half way before losing balance and returning to mat table with max A to safely sit) Standing Unsupported, Alternately Place Feet on Step/Stool: Needs assistance to keep from falling or unable to try Standing Unsupported, One Foot in Front: Needs help to step but can hold 15 seconds (performs small step with very wide BOS) Standing on One Leg: Unable to try or needs assist to prevent fall Total Score: 13 Static Standing Balance Static Standing - Balance Support: During  functional activity Static Standing - Level of Assistance: 4: Min assist;3: Mod assist Dynamic Standing Balance Dynamic Standing - Balance Support: During functional activity Dynamic Standing - Level of Assistance: 3: Mod assist;2: Max assist Extremity Assessment      RLE Assessment RLE Assessment: Exceptions to [facility] Passive Range of Motion (PROM) Comments: tight hamstrings General Strength Comments: grossly 4-/5 LLE Assessment LLE Assessment: Exceptions to [facility] Passive Range of Motion (PROM) Comments: tight hamstrings General Strength Comments: grossly 4+/5  Care Tool Care Tool Bed Mobility Roll left and right activity   Roll left and right assist level: Supervision/Verbal cueing    Sit to lying activity   Sit to lying assist level: Contact Guard/Touching assist    Lying to sitting on side of bed activity   Lying to sitting on side of bed assist level: the ability to move from lying on the back to sitting on the side of the bed with no back support.: Contact Guard/Touching assist     Care Tool Transfers Sit to stand transfer   Sit to stand assist level: Moderate Assistance - Patient 50 - 74%    Chair/bed transfer   Chair/bed transfer assist level: Moderate Assistance - Patient 50 - 74%     Psychologist, clinical transfer assist level: Moderate Assistance - Patient 50 - 74%      Care Tool Locomotion Ambulation   Assist level: Moderate Assistance - Patient 50 - 74% Assistive device: Hand held assist Max distance: 264 ft  Walk 10 feet activity   Assist level: Moderate Assistance - Patient - 50 - 74% Assistive device: Hand held assist   Walk 50 feet with 2 turns activity   Assist level: Moderate Assistance - Patient - 50 - 74% Assistive device: Hand held assist  Walk 150 feet activity   Assist level: Moderate Assistance - Patient - 50 - 74% Assistive device: Hand held assist  Walk 10 feet on uneven surfaces activity   Assist level: Moderate  Assistance - Patient - 50 - 74% Assistive device: Hand held assist  Stairs   Assist level: Moderate Assistance - Patient - 50 - 74% Stairs assistive device: 1 hand rail Max number of stairs: 4  Walk up/down 1 step activity   Walk up/down 1 step (curb) assist level: Moderate Assistance - Patient - 50 - 74% Walk up/down 1 step or curb assistive device: 1 hand rail  Walk up/down 4 steps activity   Walk up/down 4 steps assist level: Moderate Assistance - Patient - 50 - 74% Walk  up/down 4 steps assistive device: 1 hand rail  Walk up/down 12 steps activity Walk up/down 12 steps activity did not occur: Safety/medical concerns      Pick up small objects from floor   Pick up small object from the floor assist level: Moderate Assistance - Patient 50 - 74%    Wheelchair Is the patient using a wheelchair?: Yes Type of Wheelchair: Manual   Wheelchair assist level: Supervision/Verbal cueing Max wheelchair distance: 150 ft  Wheel 50 feet with 2 turns activity   Assist Level: Supervision/Verbal cueing  Wheel 150 feet activity   Assist Level: Supervision/Verbal cueing    Refer to Care Plan for Long Term Goals  SHORT TERM GOAL WEEK 1 PT Short Term Goal 1 (Week 1): Pt will ambulate >100 ft with min A or better PT Short Term Goal 2 (Week 1): Pt will remained seated OOB between sessions PT Short Term Goal 3 (Week 1): Pt will demo improved balance as quantified by ber score of 19 or better  Recommendations for other services: None   Skilled Therapeutic Intervention Evaluation completed (see details above) with patient education regarding purpose of PT evaluation, PT POC and goals, therapy schedule, weekly team meetings, and other CIR information including safety plan and fall risk safety. Pt reports pain managed on medication at this time. Pt performed the below functional mobility tasks with the specified levels of skilled cuing and assistance. Pt noted to be most limited by poor balance and  coordination.  Berg balance scale as documented above 6 Min Walk Test:  Instructed patient to ambulate as quickly and as safely as possible for 6 minutes using LRAD. Patient was allowed to take standing rest breaks without stopping the test, but if the patient required a sitting rest break the clock would be stopped and the test would be over.  Results: 264 feet (88 meters, Avg speed .37m/s) using a HHA with mod A. Results indicate that the patient has reduced endurance with ambulation compared to age matched norms.  Age Matched Norms: 23-69 yo M: 4 F: 55, 4-79 yo M: 62 F: 471, 20-89 yo M: 417 F: 392 MDC: 58.21 meters (190.98 feet) or 50 meters (ANPTA Core Set of Outcome Measures for Adults with Neurologic Conditions, 2018)    Mobility Bed Mobility Bed Mobility: Supine to Sit Supine to Sit: Contact Guard/Touching assist Transfers Transfers: Sit to Stand;Stand to Sit;Stand Pivot Transfers Sit to Stand: Moderate Assistance - Patient 50-74% Stand to Sit: Moderate Assistance - Patient 50-74% Stand Pivot Transfers: Moderate Assistance - Patient 50 - 74% Stand Pivot Transfer Details: Manual facilitation for weight shifting;Verbal cues for safe use of DME/AE;Verbal cues for sequencing Transfer (Assistive device): 1 person hand held assist Locomotion  Gait Ambulation: Yes Gait Assistance: Moderate Assistance - Patient 50-74% Gait Distance (Feet): 264 Feet Assistive device: 1 person hand held assist Gait Assistance Details: Manual facilitation for weight shifting;Verbal cues for gait pattern Gait Gait: Yes Gait Pattern: Impaired (Wide BOS, shuffling gait pattern, decreased hip and knee flexion) Gait velocity: decreased Stairs / Additional Locomotion Stairs: Yes Stairs Assistance: Moderate Assistance - Patient 50 - 74% Stair Management Technique: One rail Left Number of Stairs: 4 Height of Stairs: 6 Ramp: Moderate Assistance - Patient 50 - 74% Wheelchair Mobility Wheelchair  Mobility: Yes Wheelchair Assistance: Supervision/Verbal cueing (pt required cueing to avoid running into obstacles on L side) Wheelchair Propulsion: Both lower extermities Wheelchair Parts Management: Supervision/cueing Distance: 150 ft   Discharge Criteria: Patient will be discharged from PT  if patient refuses treatment 3 consecutive times without medical reason, if treatment goals not met, if there is a change in medical status, if patient makes no progress towards goals or if patient is discharged from hospital.  The above assessment, treatment plan, treatment alternatives and goals were discussed and mutually agreed upon: by patient  Juluis Rainier 10/08/2022, 4:20 PM

## 2022-10-08 NOTE — Evaluation (Signed)
Occupational Therapy Assessment and Plan  Patient Details  Name: Richard Andrews MRN: [id number] Date of Birth: Oct 29, 1960  OT Diagnosis: abnormal posture, acute pain, cognitive deficits, disturbance of vision, lumbago (low back pain), and muscle weakness (generalized) Rehab Potential: Rehab Potential (ACUTE ONLY): Fair ELOS: 2-2.5 weeks   Today's Date: 10/08/2022 OT Individual Time: 1000-1115 OT Individual Time Calculation (min): 75 min     Hospital Problem: Principal Problem:   Subdural hematoma (HCC)   Past Medical History:  Past Medical History:  Diagnosis Date   Allergy    Distal radius fracture, left    Hypertension    Past Surgical History:  Past Surgical History:  Procedure Laterality Date   BIOPSY  01/22/2021   Procedure: BIOPSY;  Surgeon: Shellia Cleverly, DO;  Location: [facility];  Service: Gastroenterology;;   ESOPHAGOGASTRODUODENOSCOPY (EGD) WITH PROPOFOL N/A 01/22/2021   Procedure: ESOPHAGOGASTRODUODENOSCOPY (EGD) WITH PROPOFOL;  Surgeon: Shellia Cleverly, DO;  Location: [facility];  Service: Gastroenterology;  Laterality: N/A;   I & D EXTREMITY Right 01/10/2020   Procedure: ORIF RIGHT WRIST;  Surgeon: Dominica Severin, MD;  Location: [facility];  Service: Orthopedics;  Laterality: Right;   IR ANGIO EXTERNAL CAROTID SEL EXT CAROTID UNI R MOD SED  10/04/2022   IR ANGIO INTRA EXTRACRAN SEL INTERNAL CAROTID UNI R MOD SED  09/30/2022   IR ANGIOGRAM FOLLOW UP STUDY  09/30/2022   IR NEURO EACH ADD'L AFTER BASIC UNI RIGHT (MS)  10/04/2022   IR TRANSCATH/EMBOLIZ  09/30/2022   OPEN REDUCTION INTERNAL FIXATION (ORIF) DISTAL RADIAL FRACTURE Left 01/28/2015   Procedure: OPEN REDUCTION INTERNAL FIXATION (ORIF) LEFT DISTAL RADIAL FRACTURE;  Surgeon: Tarry Kos, MD;  Location: [facility];  Service: Orthopedics;  Laterality: Left;   ORIF HUMERUS FRACTURE Right 09/27/2022   Procedure: OPEN REDUCTION INTERNAL FIXATION (ORIF) PROXIMAL HUMERUS FRACTURE;  Surgeon: Teryl Lucy, MD;  Location: [facility];  Service: Orthopedics;  Laterality: Right;   RADIOLOGY WITH ANESTHESIA N/A 09/30/2022   Procedure: IR WITH ANESTHESIA MMA EMBOLIZATION;  Surgeon: Radiologist, Medication, MD;  Location: [facility];  Service: Radiology;  Laterality: N/A;   TONSILLECTOMY      Assessment & Plan Clinical Impression:  The pt is a 62 yo male presenting 4/13 after a fall onto R shoulder. +etoh. Imaging showed R proximal humerus fx and R subacute SDH. Pt with additional fall ~1 month ago for which he did not seek medical care, suspect SDH from prior fall. S/p ORIF R proximal humerus 4/16. MMA embolization 4/19. PMH includes: HTN, L wrist fx.   Patient currently requires max with basic self-care skills secondary to muscle weakness, decreased cardiorespiratoy endurance, unbalanced muscle activation and decreased coordination, decreased attention, decreased awareness, decreased problem solving, decreased safety awareness, decreased memory, delayed processing, and demonstrates behaviors consistent with [place]o Level 5 emerging 6, and decreased sitting balance, decreased standing balance, decreased postural control, decreased balance strategies, and difficulty maintaining precautions.  Prior to hospitalization, patient could complete BADL/IADL with independent .  Patient will benefit from skilled intervention to decrease level of assist with basic self-care skills and increase independence with basic self-care skills prior to discharge home with care partner.  Anticipate patient will require 24 hour supervision and minimal physical assistance and follow up home health.  OT - End of Session Activity Tolerance: Tolerates 30+ min activity with multiple rests Endurance Deficit: Yes OT Assessment Rehab Potential (ACUTE ONLY): Fair OT Barriers to Discharge: Decreased caregiver support;Weight bearing restrictions OT Patient demonstrates impairments in  the following area(s):  Balance;Cognition;Endurance;Motor;Pain;Perception;Safety;Vision OT Basic ADL's Functional Problem(s): Grooming;Bathing;Dressing;Toileting;Eating OT Transfers Functional Problem(s): Toilet;Tub/Shower OT Additional Impairment(s): Fuctional Use of Upper Extremity OT Plan OT Intensity: Minimum of 1-2 x/day, 45 to 90 minutes OT Frequency: 5 out of 7 days OT Duration/Estimated Length of Stay: 2-2.5 weeks OT Treatment/Interventions: Balance/vestibular training;DME/adaptive equipment instruction;Patient/family education;Therapeutic Activities;Wheelchair propulsion/positioning;Therapeutic Exercise;Psychosocial support;Cognitive remediation/compensation;Community reintegration;Functional mobility training;Self Care/advanced ADL retraining;UE/LE Strength taining/ROM;UE/LE Coordination activities;Skin care/wound managment;Neuromuscular re-education;Discharge planning;Disease mangement/prevention;Pain management;Splinting/orthotics;Visual/perceptual remediation/compensation OT Self Feeding Anticipated Outcome(s): S OT Basic Self-Care Anticipated Outcome(s): MIN OT Toileting Anticipated Outcome(s): MIN OT Bathroom Transfers Anticipated Outcome(s): CGA OT Recommendation Patient destination: Home Follow Up Recommendations: Home health OT Equipment Recommended: 3 in 1 bedside comode;Tub/shower bench   OT Evaluation Precautions/Restrictions  Precautions Precautions: Fall;Shoulder Shoulder Interventions: Shoulder sling/immobilizer;At all times;Off for dressing/bathing/exercises Precaution Comments: OK for AROM at elbow, wrist, and digits only Required Braces or Orthoses: Sling Restrictions Weight Bearing Restrictions: Yes RUE Weight Bearing: Non weight bearing General Chart Reviewed: Yes Family/Caregiver Present: No Vital Signs   Pain Pain Assessment Pain Scale: 0-10 Pain Score: 0-No pain Home Living/Prior Functioning Home Living Family/patient expects to be discharged to:: Private  residence Living Arrangements: Other (Comment) Available Help at Discharge: Available 24 hours/day (brother in law) Type of Home: House Home Access: Ramped entrance Home Layout: Two level, 1/2 bath on main level Alternate Level Stairs-Number of Steps: approximately 14 Alternate Level Stairs-Rails: Right, Left, Can reach both Bathroom Shower/Tub: Tub/shower unit Bathroom Toilet: Handicapped height Bathroom Accessibility: Yes Additional Comments: RW was his wife's mothers walker  Lives With: Other (Comment) Vision Baseline Vision/History: 1 Wears glasses Ability to See in Adequate Light: 1 Impaired Patient Visual Report: No change from baseline Vision Assessment?: Yes Eye Alignment: Impaired (comment) Ocular Range of Motion: Within Functional Limits Alignment/Gaze Preference: Within Defined Limits Tracking/Visual Pursuits: Able to track stimulus in all quads without difficulty Saccades: Additional eye shifts occurred during testing Visual Fields: No apparent deficits Perception  Perception: Within Functional Limits Praxis Praxis: Intact Cognition Cognition Overall Cognitive Status: Impaired/Different from baseline Arousal/Alertness: Awake/alert Memory: Impaired Memory Impairment: Decreased short term memory;Decreased recall of new information Decreased Short Term Memory: Verbal basic Attention: Focused;Sustained Focused Attention: Impaired Focused Attention Impairment: Verbal basic;Functional basic Sustained Attention: Impaired Sustained Attention Impairment: Verbal basic;Functional basic Awareness: Impaired Awareness Impairment: Intellectual impairment Problem Solving: Impaired Problem Solving Impairment: Verbal basic;Functional basic Executive Function: Self Monitoring;Initiating Reasoning: Impaired Initiating: Impaired Self Monitoring: Impaired Safety/Judgment: Impaired Brief Interview for Mental Status (BIMS) Repetition of Three Words (First Attempt): 3 Temporal  Orientation: Year: Correct Temporal Orientation: Month: Missed by more than 1 month Temporal Orientation: Day: Incorrect Recall: "Sock": No, could not recall Recall: "Blue": Yes, after cueing ("a color") Recall: "Bed": No, could not recall BIMS Summary Score: 7 Sensation Sensation Light Touch: Appears Intact Proprioception: Impaired by gross assessment Coordination Gross Motor Movements are Fluid and Coordinated: No Fine Motor Movements are Fluid and Coordinated: No Coordination and Movement Description: slow BUE Finger Nose Finger Test: slow LUE, unable RUE d/t AROM restrictions from humerus fx Motor  Motor Motor: Abnormal postural alignment and control Motor - Skilled Clinical Observations: generalized weakness  Trunk/Postural Assessment  Cervical Assessment Cervical Assessment: Exceptions to [facility] (head forward) Thoracic Assessment Thoracic Assessment: Exceptions to [facility] (rounded shoulders) Lumbar Assessment Lumbar Assessment: Exceptions to [facility] (post/pelvic tilt) Postural Control Postural Control: Deficits on evaluation (delayed/inadequate)  Balance Balance Balance Assessed: Yes Dynamic Sitting Balance Dynamic Sitting - Balance Support: During functional activity Dynamic Sitting - Level of Assistance: 5: Stand  by assistance;4: Min Oncologist Standing - Balance Support: During functional activity Static Standing - Level of Assistance: 4: Min assist;3: Mod assist Dynamic Standing Balance Dynamic Standing - Balance Support: During functional activity Dynamic Standing - Level of Assistance: 3: Mod assist;2: Max assist Extremity/Trunk Assessment RUE Assessment RUE Assessment: Exceptions to [facility] Active Range of Motion (AROM) Comments: WFL elbow wrist and digit General Strength Comments: R humeral head fx, RUE Body System: Ortho LUE Assessment LUE Assessment: Within Functional Limits  Care Tool Care Tool Self Care Eating   Eating Assist Level:  Moderate Assistance - Patient 50 - 74%    Oral Care    Oral Care Assist Level: Maximal assistance - Patient 25 - 49%    Bathing   Body parts bathed by patient: Right arm;Chest;Abdomen;Front perineal area;Right upper leg;Left upper leg;Face Body parts bathed by helper: Left arm;Buttocks;Right lower leg;Left lower leg   Assist Level: Maximal Assistance - Patient 24 - 49%    Upper Body Dressing(including orthotics)   What is the patient wearing?: Pull over shirt;Orthosis   Assist Level: Total Assistance - Patient < 25%    Lower Body Dressing (excluding footwear)   What is the patient wearing?: Incontinence brief;Pants Assist for lower body dressing: Total Assistance - Patient < 25%    Putting on/Taking off footwear   What is the patient wearing?: Non-skid slipper socks Assist for footwear: Total Assistance - Patient < 25%       Care Tool Toileting Toileting activity   Assist for toileting: Maximal Assistance - Patient 25 - 49%     Care Tool Bed Mobility Roll left and right activity        Sit to lying activity        Lying to sitting on side of bed activity         Care Tool Transfers Sit to stand transfer        Chair/bed transfer   Chair/bed transfer assist level: Moderate Assistance - Patient 50 - 74%     Toilet transfer   Assist Level: Moderate Assistance - Patient 50 - 74%     Care Tool Cognition  Expression of Ideas and Wants Expression of Ideas and Wants: 2. Frequent difficulty - frequently exhibits difficulty with expressing needs and ideas  Understanding Verbal and Non-Verbal Content Understanding Verbal and Non-Verbal Content: 2. Sometimes understands - understands only basic conversations or simple, direct phrases. Frequently requires cues to understand   Memory/Recall Ability Memory/Recall Ability : That he or she is in a hospital/hospital unit   Refer to Care Plan for Long Term Goals  SHORT TERM GOAL WEEK 1 OT Short Term Goal 1 (Week 1): Pt will  complete 2/4 steps of UB dressing wiht MOD A OT Short Term Goal 2 (Week 1): Pt will don sling wiht MOD A OT Short Term Goal 3 (Week 1): Pt will thread BLE into pants wiht AE PRN OT Short Term Goal 4 (Week 1): Pt will stand at sink wiht no more than MIN A to demo improved balance during grooming tasks  Recommendations for other services: Neuropsych   Skilled Therapeutic Intervention ADL ADL Eating: Moderate assistance Where Assessed-Eating: Bed level Grooming: Maximal assistance Where Assessed-Grooming: Standing at sink Upper Body Bathing: Moderate assistance Where Assessed-Upper Body Bathing: Sitting at sink Lower Body Bathing: Maximal assistance Where Assessed-Lower Body Bathing: Sitting at sink Upper Body Dressing: Dependent Where Assessed-Upper Body Dressing: Sitting at sink Lower Body Dressing: Maximal assistance Where Assessed-Lower Body Dressing:  Standing at sink;Sitting at sink Toileting: Maximal assistance Where Assessed-Toileting: Teacher, adult education: Moderate assistance Toilet Transfer Method: Ambulating Mobility  Transfers Sit to Stand: Moderate Assistance - Patient 50-74% Stand to Sit: Moderate Assistance - Patient 50-74%   1:1. Pt educated on OT role/purpose, CIR, ELOS, and TBI recovery. Overall pt demo poor cognition impacting command following, carryover of WB precautions, awareness into deficits and sequencing. Pt often trying to use RUE which was in sling except for bathing/dressing completed at sink at sit to stand level as stated above. Pt demo significant posterior bias in standing with no awareness or ability to correct despite cuing needing mod-max A to prevent total LOB posteriorly. Pt posture remains flexed and kyphotic. Pt stated, "if I was at home I wouldn't have any of this trouble." Educated pt on TBI and balance issues that would lead to fall at home if he was to DC now. Pt verbalized understanding.  Exited session with pt seated in bed, exit alarm on  and call light in reach    Discharge Criteria: Patient will be discharged from OT if patient refuses treatment 3 consecutive times without medical reason, if treatment goals not met, if there is a change in medical status, if patient makes no progress towards goals or if patient is discharged from hospital.  The above assessment, treatment plan, treatment alternatives and goals were discussed and mutually agreed upon: by patient  Shon Hale 10/08/2022, 11:11 AM

## 2022-10-08 NOTE — Progress Notes (Signed)
PROGRESS NOTE   Subjective/Complaints:  Pt doing alright, slept ok, having a lot of R shoulder pain but doesn't appear to be receiving PRN meds so encouraged pt to ask for pain meds when/if needed. No BM yet since prior to admission, agreeable to sorbitol today. Last BM documented as 4/24 but unclear when it actually was. He states it's abnormal for him to go this long without a BM. Urinating well. Denies any other complaints or concerns today.   ROS: per HPI. +Constipation. Denies fevers, chills, CP, SOB, abd pain, N/V/D, new/worsening paresthesias/weakness, or any other complaints at this time.     Objective:   No results found. Recent Labs    10/08/22 0549  WBC 3.8*  HGB 10.8*  HCT 31.8*  PLT 210   Recent Labs    10/08/22 0549  NA 134*  K 3.7  CL 102  CO2 23  GLUCOSE 99  BUN 16  CREATININE 0.99  CALCIUM 9.5    Intake/Output Summary (Last 24 hours) at 10/08/2022 1318 Last data filed at 10/08/2022 0500 Gross per 24 hour  Intake 120 ml  Output 400 ml  Net -280 ml        Physical Exam: Vital Signs Blood pressure 109/74, pulse 61, temperature 98.1 F (36.7 C), resp. rate 14, height 5\' 11"  (1.803 m), weight 62.4 kg, SpO2 98 %.  Constitutional: No apparent distress. Appropriate appearance for age. + Disheveled appearance HENT: No JVD. Neck Supple. Trachea midline. Atraumatic, normocephalic. Eyes: PERRLA. EOMI. Visual fields grossly intact.  Nystagmus on left greater than right horizontal gaze Cardiovascular: RRR, no murmurs/rub/gallops. No Edema. Peripheral pulses 2+.  Right upper extremity capillary refill brisk. Respiratory: CTAB. No rales, rhonchi, or wheezing. On RA.  Abdomen: + bowel sounds, slightly hypoactive. No distention or tenderness.  GU: Not examined. Using urinal this morning.  Skin: RUE surgical dressing intact.  MsK: R shoulder partially in sling, doesn't want it readjusted. Some scattered  bruises.   PRIOR EXAMS: MSK:      Right upper extremity sling      Strength:                RUE: Grip, finger abduction 5 out of 5                LUE: 5/5 SA, 5/5 EF, 5/5 EE, 5/5 WE, 5/5 FF, 5/5 FA                 RLE: 5/5 HF, 5/5 KE, 5/5 DF, 5/5 EHL, 5/5 PF                 LLE:  5/5 HF, 5/5 KE, 5/5 DF, 5/5 EHL, 5/5 PF    Neurologic exam:  Cognition: AAO to person, place; not time Language: Fluent, in short phrases.  + Dysarthria and occasional  neoglisms.Names 2/3 objects correctly.  Memory: Recalls 1/3 objects at 5 minutes.  Insight: Poor insight into current condition.  Mood: Flat affect, appropriate mood.  Sensation: To light touch intact in BL UEs and LEs  Reflexes: 2+ in BL UE and LEs. Negative Hoffman's and babinski signs bilaterally.  CN: Questionable mild left facial droop; otherwise intact Coordination: +  ataxia on FTN, HTS bilaterally.  Spasticity: MAS 0 in all extremities.  Assessment/Plan: 1. Functional deficits which require 3+ hours per day of interdisciplinary therapy in a comprehensive inpatient rehab setting. Physiatrist is providing close team supervision and 24 hour management of active medical problems listed below. Physiatrist and rehab team continue to assess barriers to discharge/monitor patient progress toward functional and medical goals  Care Tool:  Bathing    Body parts bathed by patient: Right arm, Chest, Abdomen, Front perineal area, Right upper leg, Left upper leg, Face   Body parts bathed by helper: Left arm, Buttocks, Right lower leg, Left lower leg     Bathing assist Assist Level: Maximal Assistance - Patient 24 - 49%     Upper Body Dressing/Undressing Upper body dressing   What is the patient wearing?: Pull over shirt, Orthosis    Upper body assist Assist Level: Total Assistance - Patient < 25%    Lower Body Dressing/Undressing Lower body dressing      What is the patient wearing?: Incontinence brief, Pants     Lower body assist  Assist for lower body dressing: Total Assistance - Patient < 25%     Toileting Toileting    Toileting assist Assist for toileting: Maximal Assistance - Patient 25 - 49%     Transfers Chair/bed transfer  Transfers assist     Chair/bed transfer assist level: Moderate Assistance - Patient 50 - 74%     Locomotion Ambulation   Ambulation assist              Walk 10 feet activity   Assist           Walk 50 feet activity   Assist           Walk 150 feet activity   Assist           Walk 10 feet on uneven surface  activity   Assist           Wheelchair     Assist               Wheelchair 50 feet with 2 turns activity    Assist            Wheelchair 150 feet activity     Assist          Blood pressure 109/74, pulse 61, temperature 98.1 F (36.7 C), resp. rate 14, height 5\' 11"  (1.803 m), weight 62.4 kg, SpO2 98 %.  Medical Problem List and Plan: 1. Functional deficits secondary to traumatic SDH status post MMA embolization             -patient may shower             -ELOS/Goals: 16-18 days, Min A PT/OT/SLP  -Continue CIR   2.  Antithrombotics: -DVT/anticoagulation:  Pharmaceutical: Lovenox 30 mg BID continued             -antiplatelet therapy: none   3. Pain Management: Avoid Tylenol; oxycodone 5-10mg  q4h PRN, Robaxin PRN -10/08/22 Per pharmacy, was on gabapentin outpatient; defer to primary team whether this is needing to be restarted. Also encouraged pt to utilize PRN pain meds since he's endorsing pain but no meds have been given   4. Mood/Behavior/Sleep: LCSW to evaluate and provide emotional support             -antipsychotic agents: n/a  -Trazodone PRN   5. Neuropsych/cognition: This patient is not capable of making decisions on his own behalf.  6. Skin/Wound Care: Routine skin care checks   7. Fluids/Electrolytes/Nutrition: Routine Is and Os and follow-up chemistries             -continue  thiamine and folic acid supplements, and MVI             -10/08/22 Vit B12 level WNL at 865   8: Alcoholism: on Librium taper, ended prior to CIR admission; encourage cessation   9: History of gastric ulcers, non-bleeding: 2021 -continue Protonix 40mg  QD while hospitalized, sucralfate not restarted at this time   10: Hypertension: Monitor TID and prn; has been on lisinopril 10 mg in the past ([facility]>>he did not follow-up) but not restarted             -has been normotensive this admission  -10/08/22 BPs stable, monitor for now Vitals:   10/07/22 1602 10/07/22 1922 10/08/22 0502  BP: 117/85 106/76 109/74      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 Conchita Paris   12: Right proximal humerus s/p ORIF 09/27/22              -NWB RUE, ok for AROM at elbow, wrist, and fingers             -keep in sling             -follow-up with Dr. Dion Saucier   13: Macrocytic anemia: Hgb stable at 10.8 on 10/08/22, monitor routine labs; B12 level WNL as above   14: Thrombocytopenia: improved on 10/08/22 labs, monitor on routine labs  15: Constipation: no BM in several days, or longer (?1wk) -10/08/22 still no BM, will give Sorbitol 60ml x1 now, monitor for effect; continue miralax 17g BID, senokot S 1 tab BID, and PRNs    LOS: 1 days A FACE TO FACE EVALUATION WAS PERFORMED  [address] 10/08/2022, 1:18 PM

## 2022-10-08 NOTE — Plan of Care (Signed)
  Problem: RH Cognition - SLP Goal: RH LTG Patient will demonstrate orientation with cues Description:  LTG:  Patient will demonstrate orientation to person/place/time/situation with cues (SLP)   Flowsheets (Taken 10/08/2022 0943) LTG Patient will demonstrate orientation to:  Person  Place  Time  Situation LTG: Patient will demonstrate orientation using cueing (SLP): Supervision   Problem: RH Comprehension Communication Goal: LTG Patient will comprehend basic/complex auditory (SLP) Description: LTG: Patient will comprehend basic/complex auditory information with cues (SLP). Flowsheets (Taken 10/08/2022 [phone]) LTG: Patient will comprehend: Basic auditory information LTG: Patient will comprehend auditory information with cueing (SLP): Supervision   Problem: RH Expression Communication Goal: LTG Patient will increase speech intelligibility (SLP) Description: LTG: Patient will increase speech intelligibility at word/phrase/conversation level with cues, % of the time (SLP) Flowsheets (Taken 10/08/2022 0943) LTG: Patient will increase speech intelligibility (SLP): Supervision Level: Conversation level Percent of time patient will use intelligible speech: 90   Problem: RH Problem Solving Goal: LTG Patient will demonstrate problem solving for (SLP) Description: LTG:  Patient will demonstrate problem solving for basic/complex daily situations with cues  (SLP) Flowsheets (Taken 10/08/2022 0943) LTG: Patient will demonstrate problem solving for (SLP): Basic daily situations LTG Patient will demonstrate problem solving for: Supervision   Problem: RH Memory Goal: LTG Patient will demonstrate ability for day to day (SLP) Description: LTG:   Patient will demonstrate ability for day to day recall/carryover during cognitive/linguistic activities with assist  (SLP) Flowsheets (Taken 10/08/2022 0943) LTG: Patient will demonstrate ability for day to day recall: New information LTG: Patient will  demonstrate ability for day to day recall/carryover during cognitive/linguistic activities with assist (SLP): Supervision   Problem: RH Attention Goal: LTG Patient will demonstrate this level of attention during functional activites (SLP) Description: LTG:  Patient will will demonstrate this level of attention during functional activites (SLP) Flowsheets (Taken 10/08/2022 [phone]) Patient will demonstrate during cognitive/linguistic activities the attention type of: Sustained Patient will demonstrate this level of attention during cognitive/linguistic activities in: Home LTG: Patient will demonstrate this level of attention during cognitive/linguistic activities with assistance of (SLP): Supervision   Problem: RH Awareness Goal: LTG: Patient will demonstrate awareness during functional activites type of (SLP) Description: LTG: Patient will demonstrate awareness during functional activites type of (SLP) Flowsheets (Taken 10/08/2022 [phone]) Patient will demonstrate during cognitive/linguistic activities awareness type of: Emergent LTG: Patient will demonstrate awareness during cognitive/linguistic activities with assistance of (SLP): Supervision

## 2022-10-09 DIAGNOSIS — R400 Somnolence: Secondary | ICD-10-CM

## 2022-10-09 DIAGNOSIS — K5901 Slow transit constipation: Secondary | ICD-10-CM | POA: Diagnosis not present

## 2022-10-09 DIAGNOSIS — S065XAA Traumatic subdural hemorrhage with loss of consciousness status unknown, initial encounter: Secondary | ICD-10-CM | POA: Diagnosis not present

## 2022-10-09 DIAGNOSIS — M25511 Pain in right shoulder: Secondary | ICD-10-CM | POA: Diagnosis not present

## 2022-10-09 NOTE — Progress Notes (Signed)
Physical Therapy Session Note  Patient Details  Name: Richard Andrews MRN: [id number] Date of Birth: 11-Nov-1960  Today's Date: 10/09/2022 PT Individual Time: 1345-1430, 4098-1191  PT Individual Time Calculation (min): 45 min, 45 min   Short Term Goals: Week 1:  PT Short Term Goal 1 (Week 1): Pt will ambulate >100 ft with min A or better PT Short Term Goal 2 (Week 1): Pt will remained seated OOB between sessions PT Short Term Goal 3 (Week 1): Pt will demo improved balance as quantified by ber score of 19 or better  Skilled Therapeutic Interventions/Progress Updates:      Therapy Documentation Precautions:  Precautions Precautions: Fall, Shoulder Shoulder Interventions: Shoulder sling/immobilizer, At all times, Off for dressing/bathing/exercises Precaution Comments: OK for AROM at elbow, wrist, and digits only Required Braces or Orthoses: Sling Restrictions Weight Bearing Restrictions: Yes RUE Weight Bearing: Non weight bearing  Treatment Session 1:   Pt received seated in w/c at bedside requesting for assistance with toileting. Pt requires mod A with STS from w/c with cues for R UE NWB in sling. Pt requires max A for urinal placement and was able to successfully void, documented in flowsheet.   Pt min A with ambulatory transfer with L HHA to sink and performed handwashing at sink.   Pt min A with gait ~300 ft with L HHA to dayroom and presents with forward head, rounded shoulders, trunk flexion, and decreased left foot dorsiflexion. Pt responds well to verbal cue to pick up left foot when he hears it scuff against the ground.   Pt performed Kinetron at 50 cm/second to facilitate increased left LE attention, weight shift, and strength. Pt requires min tactile cues at quads/gluteal muscles with activity and performed 1 x 2 min.   Pt ambulated min A with L HHA to room and able to demonstrate increased speed with PT pacing. Pt reports back pain in session from sitting up in the w/c and  requested to return to bed.   Pt left semi-reclined in bed with all needs in reach, bed alarm on and nurse present for pain medication administration.     10/09/22 1200  Observation Details  Observation Environment CIR  Start of observation period - Date 10/09/22  Start of observation period - Time 1118  End of observation period - Date 10/09/22  End of observation period - Time 1202  Agitated Behavior Scale (DO NOT LEAVE BLANKS)  Short attention span, easy distractibility, inability to concentrate 3  Impulsive, impatient, low tolerance for pain or frustration 2  Uncooperative, resistant to care, demanding 1  Violent and/or threatening violence toward people or property 1  Explosive and/or unpredictable anger 1  Rocking, rubbing, moaning, or other self-stimulating behavior 1  Pulling at tubes, restraints, etc. 1  Wandering from treatment areas 1  Restlessness, pacing, excessive movement 1  Repetitive behaviors, motor, and/or verbal 1  Rapid, loud, or excessive talking 1  Sudden changes of mood 1  Easily initiated or excessive crying and/or laughter 1  Self-abusiveness, physical and/or verbal 1  Agitated behavior scale total score 17     Treatment Session 2:   Pt received semi-reclined in bed and reports improvement in low back pain following medication administration and repositioning. Pt agreeable to PT session with emphasis on high intensity gait training. Pt requires mod A with STS as he presents with posterior lean that requires cues to facilitate anterior weight shift. Pt requires intermittent L HHA with gait and ambulated >500 ft in session without  AD. Pt presents with forward trunk and required mod A for an instance of anterior loss of balance. Pt responds well to cue of stand up tall to correct posture but unable to maintain. Pt also performed car transfer with CGA/min A by demonstrating single limb balance and stepping into vehicle to simulate truck height. Pt requires min A  for ramp navigation ~10 ft and for gait on uneven terrain (mulch). Pt requires min A for curb step down from mulch. Pt requires max directional cueing to orient self and locate room. Pt discussed his history of substance use and is open to further discussion with neuropsychologist regarding coping. Pt left semi-reclined in bed with all needs in reach and alarm on.     10/09/22 1400  Observation Details  Observation Environment CIR  Start of observation period - Date 10/09/22  Start of observation period - Time 1345  End of observation period - Date 10/09/22  End of observation period - Time 1430  Agitated Behavior Scale (DO NOT LEAVE BLANKS)  Short attention span, easy distractibility, inability to concentrate 3  Impulsive, impatient, low tolerance for pain or frustration 1  Uncooperative, resistant to care, demanding 1  Violent and/or threatening violence toward people or property 1  Explosive and/or unpredictable anger 1  Rocking, rubbing, moaning, or other self-stimulating behavior 1  Pulling at tubes, restraints, etc. 1  Wandering from treatment areas 1  Restlessness, pacing, excessive movement 1  Repetitive behaviors, motor, and/or verbal 1  Rapid, loud, or excessive talking 1  Sudden changes of mood 1  Easily initiated or excessive crying and/or laughter 1  Self-abusiveness, physical and/or verbal 1  Agitated behavior scale total score 16     Therapy/Group: Individual Therapy  Truitt Leep Truitt Leep PT, DPT  10/09/2022, 7:56 AM

## 2022-10-09 NOTE — Progress Notes (Signed)
Occupational Therapy Session Note  Patient Details  Name: Richard Andrews MRN: [id number] Date of Birth: 1961-05-24  Today's Date: 10/09/2022 OT Individual Time: 4098-1191 OT Individual Time Calculation (min): 74 min    Short Term Goals: Week 1:  OT Short Term Goal 1 (Week 1): Pt will complete 2/4 steps of UB dressing wiht MOD A OT Short Term Goal 2 (Week 1): Pt will don sling wiht MOD A OT Short Term Goal 3 (Week 1): Pt will thread BLE into pants wiht AE PRN OT Short Term Goal 4 (Week 1): Pt will stand at sink wiht no more than MIN A to demo improved balance during grooming tasks  Skilled Therapeutic Interventions/Progress Updates:   Pt resting in bed upon OT arrival. Able to participate in calendar posted for RO at start of session with min cues. Mod cues for re-education re: R sh WB precautions and sling use and provided reminders throughout session. Pt agreeable to OOB to sink side for AM self care retraining and able to report he was up without assistance to use the bathroom when he could not operate the call button. OT re-educated during session and at end to not mobilize without assistance. Pt required CGA with rail on R with L UE to move supine to sit, EOB with cues for forawrd weight shift and min A. Sit to stand for short amb with L HHA to w/c with unsteady gait and report of "shaky legs". Pt sat sink side initially for sponge bathing with L UE with mod A overall UB for sling and gown doffing as well and max A for LB using L UE only. Instructed in R hemi techniques for pull over shirt with mod A, max A for elastic wasited shorts with unsteady standing with L UE support for OT to pull up. Seated shaving and oral care with min use of R hand as functional assist while in sling. L UE use with min A for tasks. Stood with min A to maintain standing for oral care with tolerance 1 min max. Pt relatively flat at start of session however opened up to OT about typical routines at home PTA including use  of ETOH and how his body is feeling now being in the hospital. Pt requested to use phone to call son. Pt reports it is a new phone and needed mod cues to navigate the screen to make call to son. Asking to be picked up tomorrow but able to rationalize he was in hospital and with therapy. With reading glasses. Pt able to report which therapy was in now, who was coming next and what time as well as how long he had to wait. Pt reminded not to get up without assist and left in w/c with chair alarm active, needs, and nurse call button in reach.   Pain: no pain reported at rest     10/09/22 0900  Observation Details  Observation Environment CIR  Start of observation period - Date 10/09/22  Start of observation period - Time 0801  End of observation period - Date 10/09/22  End of observation period - Time 0915  Agitated Behavior Scale (DO NOT LEAVE BLANKS)  Short attention span, easy distractibility, inability to concentrate 3  Impulsive, impatient, low tolerance for pain or frustration 2  Uncooperative, resistant to care, demanding 1  Violent and/or threatening violence toward people or property 1  Explosive and/or unpredictable anger 1  Rocking, rubbing, moaning, or other self-stimulating behavior 1  Pulling at tubes, restraints,  etc. 1  Wandering from treatment areas 1  Restlessness, pacing, excessive movement 1  Repetitive behaviors, motor, and/or verbal 1  Rapid, loud, or excessive talking 1  Sudden changes of mood 1  Easily initiated or excessive crying and/or laughter 1  Self-abusiveness, physical and/or verbal 1  Agitated behavior scale total score 17      Therapy Documentation Precautions:  Precautions Precautions: Fall, Shoulder Shoulder Interventions: Shoulder sling/immobilizer, At all times, Off for dressing/bathing/exercises Precaution Comments: OK for AROM at elbow, wrist, and digits only Required Braces or Orthoses: Sling Restrictions Weight Bearing Restrictions:  Yes RUE Weight Bearing: Non weight bearing    Therapy/Group: Individual Therapy  Vicenta Dunning 10/09/2022, 8:18 AM

## 2022-10-09 NOTE — Progress Notes (Signed)
Speech Language Pathology TBI Note  Patient Details  Name: Richard Andrews MRN: [id number] Date of Birth: [birth year]  Today's Date: 10/09/2022 SLP Individual Time: 8119-1478 SLP Individual Time Calculation (min): 45 min  Short Term Goals: Week 1: SLP Short Term Goal 1 (Week 1): Pt will increase orientation to time, place and recent medical events with min A verbal and visual cues SLP Short Term Goal 2 (Week 1): Pt will demonstrate sustained attention for 4-6 minutes during functional tasks provided mod A cues SLP Short Term Goal 3 (Week 1): Pt will utilize speech intelligibility strategies as trained with mod A cues for 75% intellibility at simple sentence level SLP Short Term Goal 4 (Week 1): Pt will follow 2-3 step directions during functional tasks with min A cues SLP Short Term Goal 5 (Week 1): Pt will ID 2 physical and 2 cognitive changes s/p fall/hospitalization with mod A cues SLP Short Term Goal 6 (Week 1): Pt will recall functional, novel information (weight bearing precautions, etc) with 50% accuracy provided mod A verbal cues  Skilled Therapeutic Interventions:    Pt was seen in am to address cognitive re- training and speech intelligibility. Pt was alert and seated upright in WC upon SLP arrival. He was oriented to being in the hospital but not temporal concepts or situation. Given external visual aid, pt warranting min to mod A for identification of month, day of week, and year. Cues unable to be faded across session with poor carryover and need for consistent cues for re-orientation. SLP further addressed speech intelligibility through review of A BOSS speech strategies. During simple conversational exchange pt requiring min to mod A for over articulation and amplification. SLP also challenging pt in recall of functional information. SLP guided pt in repetition strategies. Pt recalled information given a 1-2 minute delay with 67% acc with min A. In final minutes of session, SLP  challenged pt in recall of safety precautions. Pt identified weight bearing precautions, not walking indep, use of call button etc. With 80% acc with sup A. Pt left upright in WC with call button within reach. SLP to continue POC.  Pain  No pain  Agitated Behavior Scale: TBI Observation Details Observation Environment: CIR Start of observation period - Date: 10/06/22 Start of observation period - Time: 0945 End of observation period - Date: 10/09/22 End of observation period - Time: 1030 Agitated Behavior Scale (DO NOT LEAVE BLANKS) Short attention span, easy distractibility, inability to concentrate: Present to a moderate degree Impulsive, impatient, low tolerance for pain or frustration: Present to a slight degree Uncooperative, resistant to care, demanding: Absent Violent and/or threatening violence toward people or property: Absent Explosive and/or unpredictable anger: Absent Rocking, rubbing, moaning, or other self-stimulating behavior: Absent Pulling at tubes, restraints, etc.: Absent Wandering from treatment areas: Absent Restlessness, pacing, excessive movement: Absent Repetitive behaviors, motor, and/or verbal: Absent Rapid, loud, or excessive talking: Absent Sudden changes of mood: Absent Easily initiated or excessive crying and/or laughter: Absent Self-abusiveness, physical and/or verbal: Absent Agitated behavior scale total score: 17  Therapy/Group: Individual Therapy  Renaee Munda 10/09/2022, 3:28 PM

## 2022-10-09 NOTE — Progress Notes (Signed)
PROGRESS NOTE   Subjective/Complaints:  Pt doing well this morning, states that he slept well but that he sometimes gets confused at night because he thinks it's time to get up-- reports he's been napping a lot during the day and thinks he's just getting his days/nights confused. LBM today, finally! (Though not documented). Urinating well. Pain is doing alright but he still forgets to ask for pain meds, will try to remember to ask today-- got one dose yesterday morning. Denies any other complaints or concerns today.   ROS: per HPI. +Constipation-improved. Denies fevers, chills, CP, SOB, abd pain, N/V/D, new/worsening paresthesias/weakness, or any other complaints at this time.     Objective:   No results found. Recent Labs    10/08/22 0549  WBC 3.8*  HGB 10.8*  HCT 31.8*  PLT 210   Recent Labs    10/08/22 0549  NA 134*  K 3.7  CL 102  CO2 23  GLUCOSE 99  BUN 16  CREATININE 0.99  CALCIUM 9.5    Intake/Output Summary (Last 24 hours) at 10/09/2022 1212 Last data filed at 10/09/2022 0700 Gross per 24 hour  Intake 580 ml  Output 150 ml  Net 430 ml        Physical Exam: Vital Signs Blood pressure 106/75, pulse (!) 59, temperature 98.4 F (36.9 C), temperature source Oral, resp. rate 16, height 5\' 11"  (1.803 m), weight 62.4 kg, SpO2 100 %.  Constitutional: No apparent distress. Appropriate appearance for age. + Disheveled appearance, sitting upright in w/c HENT: No JVD. Neck Supple. Trachea midline. Atraumatic, normocephalic. Eyes: PERRLA. EOMI. Visual fields grossly intact.  Nystagmus on left greater than right horizontal gaze Cardiovascular: RRR, no murmurs/rub/gallops. No Edema. Peripheral pulses 2+.  Right upper extremity capillary refill brisk. Respiratory: CTAB. No rales, rhonchi, or wheezing. On RA.  Abdomen: + bowel sounds, more normoactive. No distention or tenderness.  GU: Not examined. Skin: RUE  surgical dressing intact.  MsK: R shoulder in sling. Some scattered bruises.  Neuro: memory appears intact, no confusion noted, seems to be more alert and clearer than yesterday.   PRIOR EXAMS: MSK:      Right upper extremity sling      Strength:                RUE: Grip, finger abduction 5 out of 5                LUE: 5/5 SA, 5/5 EF, 5/5 EE, 5/5 WE, 5/5 FF, 5/5 FA                 RLE: 5/5 HF, 5/5 KE, 5/5 DF, 5/5 EHL, 5/5 PF                 LLE:  5/5 HF, 5/5 KE, 5/5 DF, 5/5 EHL, 5/5 PF    Neurologic exam:  Cognition: AAO to person, place; not time Language: Fluent, in short phrases.  + Dysarthria and occasional  neoglisms.Names 2/3 objects correctly.  Memory: Recalls 1/3 objects at 5 minutes.  Insight: Poor insight into current condition.  Mood: Flat affect, appropriate mood.  Sensation: To light touch intact in BL UEs and LEs  Reflexes: 2+ in BL UE and LEs. Negative Hoffman's and babinski signs bilaterally.  CN: Questionable mild left facial droop; otherwise intact Coordination: + ataxia on FTN, HTS bilaterally.  Spasticity: MAS 0 in all extremities.  Assessment/Plan: 1. Functional deficits which require 3+ hours per day of interdisciplinary therapy in a comprehensive inpatient rehab setting. Physiatrist is providing close team supervision and 24 hour management of active medical problems listed below. Physiatrist and rehab team continue to assess barriers to discharge/monitor patient progress toward functional and medical goals  Care Tool:  Bathing    Body parts bathed by patient: Right arm, Chest, Abdomen, Front perineal area, Right upper leg, Left upper leg, Face   Body parts bathed by helper: Left arm, Buttocks, Right lower leg, Left lower leg     Bathing assist Assist Level: Maximal Assistance - Patient 24 - 49%     Upper Body Dressing/Undressing Upper body dressing   What is the patient wearing?: Pull over shirt, Orthosis    Upper body assist Assist Level: Total  Assistance - Patient < 25%    Lower Body Dressing/Undressing Lower body dressing      What is the patient wearing?: Incontinence brief, Pants     Lower body assist Assist for lower body dressing: Total Assistance - Patient < 25%     Toileting Toileting    Toileting assist Assist for toileting: Maximal Assistance - Patient 25 - 49%     Transfers Chair/bed transfer  Transfers assist     Chair/bed transfer assist level: Moderate Assistance - Patient 50 - 74%     Locomotion Ambulation   Ambulation assist      Assist level: Moderate Assistance - Patient 50 - 74% Assistive device: Hand held assist Max distance: 264 ft   Walk 10 feet activity   Assist     Assist level: Moderate Assistance - Patient - 50 - 74% Assistive device: Hand held assist   Walk 50 feet activity   Assist    Assist level: Moderate Assistance - Patient - 50 - 74% Assistive device: Hand held assist    Walk 150 feet activity   Assist    Assist level: Moderate Assistance - Patient - 50 - 74% Assistive device: Hand held assist    Walk 10 feet on uneven surface  activity   Assist     Assist level: Moderate Assistance - Patient - 50 - 74% Assistive device: Hand held assist   Wheelchair     Assist Is the patient using a wheelchair?: Yes Type of Wheelchair: Manual    Wheelchair assist level: Supervision/Verbal cueing Max wheelchair distance: 150 ft    Wheelchair 50 feet with 2 turns activity    Assist        Assist Level: Supervision/Verbal cueing   Wheelchair 150 feet activity     Assist      Assist Level: Supervision/Verbal cueing   Blood pressure 106/75, pulse (!) 59, temperature 98.4 F (36.9 C), temperature source Oral, resp. rate 16, height 5\' 11"  (1.803 m), weight 62.4 kg, SpO2 100 %.  Medical Problem List and Plan: 1. Functional deficits secondary to traumatic SDH status post MMA embolization             -patient may shower              -ELOS/Goals: 16-18 days, Min A PT/OT/SLP  -Continue CIR   2.  Antithrombotics: -DVT/anticoagulation:  Pharmaceutical: Lovenox 30 mg BID continued             -  antiplatelet therapy: none   3. Pain Management: Avoid Tylenol; oxycodone 5-10mg  q4h PRN, Robaxin PRN -10/08/22 Per pharmacy, was on gabapentin outpatient; defer to primary team whether this is needing to be restarted. Also encouraged pt to utilize PRN pain meds since he's endorsing pain but no meds have been given -10/09/22 pain improving, remembered once yesterday to ask for pain meds   4. Mood/Behavior/Sleep: LCSW to evaluate and provide emotional support             -antipsychotic agents: n/a  -Trazodone PRN -10/09/22 apparently some nighttime confusion and day/night confusion, napping a lot during the day; advised avoidance of naps during the day to keep sleep schedule better; will get sleep log tonight; monitor   5. Neuropsych/cognition: This patient is not capable of making decisions on his own behalf.   6. Skin/Wound Care: Routine skin care checks   7. Fluids/Electrolytes/Nutrition: Routine Is and Os and follow-up chemistries             -continue thiamine and folic acid supplements, and MVI             -10/08/22 Vit B12 level WNL at 865   8: Alcoholism: on Librium taper, ended prior to CIR admission; encourage cessation -10/09/22 some overnight confusion; wonder if it's from mild withdrawal vs daytime napping? 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 Conchita Paris   12: Right proximal humerus s/p ORIF 09/27/22              -NWB RUE, ok for AROM at elbow, wrist, and fingers             -keep in sling             -follow-up with Dr. Dion Saucier   13: Macrocytic anemia: Hgb stable at 10.8 on 10/08/22, monitor routine labs; B12 level WNL as above   14: Thrombocytopenia: improved on 10/08/22 labs, monitor on routine labs  15: Constipation: no BM in several days, or longer (?1wk) -10/08/22 still no BM, will give Sorbitol 60ml x1 now, monitor for effect; continue miralax 17g BID, senokot S 1 tab BID, and PRNs -10/09/22 pt states BM this AM, not well documented; monitor, asked nursing to please try to document BMs    LOS: 2 days A FACE TO FACE EVALUATION WAS PERFORMED  [address] 10/09/2022, 12:12 PM

## 2022-10-10 DIAGNOSIS — F1020 Alcohol dependence, uncomplicated: Secondary | ICD-10-CM

## 2022-10-10 DIAGNOSIS — M79621 Pain in right upper arm: Secondary | ICD-10-CM | POA: Diagnosis not present

## 2022-10-10 DIAGNOSIS — E876 Hypokalemia: Secondary | ICD-10-CM

## 2022-10-10 DIAGNOSIS — I10 Essential (primary) hypertension: Secondary | ICD-10-CM | POA: Diagnosis not present

## 2022-10-10 DIAGNOSIS — K5900 Constipation, unspecified: Secondary | ICD-10-CM

## 2022-10-10 DIAGNOSIS — S065XAA Traumatic subdural hemorrhage with loss of consciousness status unknown, initial encounter: Secondary | ICD-10-CM | POA: Diagnosis not present

## 2022-10-10 LAB — CBC
HCT: 29.5 % — ABNORMAL LOW (ref 39.0–52.0)
Hemoglobin: 10.2 g/dL — ABNORMAL LOW (ref 13.0–17.0)
MCH: 34.9 pg — ABNORMAL HIGH (ref 26.0–34.0)
MCHC: 34.6 g/dL (ref 30.0–36.0)
MCV: 101 fL — ABNORMAL HIGH (ref 80.0–100.0)
Platelets: 209 10*3/uL (ref 150–400)
RBC: 2.92 MIL/uL — ABNORMAL LOW (ref 4.22–5.81)
RDW: 12.5 % (ref 11.5–15.5)
WBC: 3.9 10*3/uL — ABNORMAL LOW (ref 4.0–10.5)
nRBC: 0 % (ref 0.0–0.2)

## 2022-10-10 LAB — BASIC METABOLIC PANEL
Anion gap: 8 (ref 5–15)
BUN: 13 mg/dL (ref 8–23)
CO2: 24 mmol/L (ref 22–32)
Calcium: 9.1 mg/dL (ref 8.9–10.3)
Chloride: 102 mmol/L (ref 98–111)
Creatinine, Ser: 1 mg/dL (ref 0.61–1.24)
GFR, Estimated: >60 mL/min (ref >60)
Glucose, Bld: 99 mg/dL (ref 70–99)
Potassium: 3.4 mmol/L — ABNORMAL LOW (ref 3.5–5.1)
Sodium: 134 mmol/L — ABNORMAL LOW (ref 135–145)

## 2022-10-10 LAB — GLUCOSE, CAPILLARY: Glucose-Capillary: 148 mg/dL — ABNORMAL HIGH (ref 70–99)

## 2022-10-10 MED ORDER — GABAPENTIN 100 MG PO CAPS
100.0000 mg | ORAL_CAPSULE | Freq: Three times a day (TID) | ORAL | Status: DC
  Administered 2022-10-10 – 2022-10-21 (×32): 100 mg via ORAL
  Filled 2022-10-10 (×34): qty 1

## 2022-10-10 MED ORDER — POTASSIUM CHLORIDE CRYS ER 20 MEQ PO TBCR
30.0000 meq | EXTENDED_RELEASE_TABLET | Freq: Once | ORAL | Status: AC
  Administered 2022-10-10: 30 meq via ORAL
  Filled 2022-10-10: qty 1

## 2022-10-10 NOTE — Progress Notes (Signed)
Inpatient Rehabilitation  Patient information reviewed and entered into eRehab system by Alyna Stensland M. Raeford Brandenburg, M.A., CCC/SLP, PPS Coordinator.  Information including medical coding, functional ability and quality indicators will be reviewed and updated through discharge.    

## 2022-10-10 NOTE — Care Management (Deleted)
Inpatient Rehabilitation Center Individual Statement of Services  Patient Name:  Richard Andrews  Date:  10/10/2022  Welcome to the Inpatient Rehabilitation Center.  Our goal is to provide you with an individualized program based on your diagnosis and situation, designed to meet your specific needs.  With this comprehensive rehabilitation program, you will be expected to participate in at least 3 hours of rehabilitation therapies Monday-Friday, with modified therapy programming on the weekends.  Your rehabilitation program will include the following services:  Physical Therapy (PT), Occupational Therapy (OT), 24 hour per day rehabilitation nursing, Therapeutic Recreaction (TR), Psychology, Neuropsychology, Care Coordinator, Rehabilitation Medicine, Nutrition Services, Pharmacy Services, and Other  Weekly team conferences will be held on Tuesdays to discuss your progress.  Your Inpatient Rehabilitation Care Coordinator will talk with you frequently to get your input and to update you on team discussions.  Team conferences with you and your family in attendance may also be held.  Expected length of stay: 14 days    Overall anticipated outcome: Supervision to Minimal Assistance  Depending on your progress and recovery, your program may change. Your Inpatient Rehabilitation Care Coordinator will coordinate services and will keep you informed of any changes. Your Inpatient Rehabilitation Care Coordinator's name and contact numbers are listed  below.  The following services may also be recommended but are not provided by the Inpatient Rehabilitation Center:  Driving Evaluations Home Health Rehabiltiation Services Outpatient Rehabilitation Services Vocational Rehabilitation   Arrangements will be made to provide these services after discharge if needed.  Arrangements include referral to agencies that provide these services.  Your insurance has been verified to be:  [employer]  Your primary doctor is:   [name]  Pertinent information will be shared with your doctor and your insurance company.  Inpatient Rehabilitation Care Coordinator:  Susie Cassette [phone] or ([phone]  Information discussed with and copy given to patient by: Gretchen Short, 10/10/2022, 11:56 AM

## 2022-10-10 NOTE — IPOC Note (Signed)
Overall Plan of Care [facility]) Patient Details Name: Richard Andrews MRN: [id number] DOB: 1961/04/26  Admitting Diagnosis: Subdural hematoma [facility])  Hospital Problems: Principal Problem:   Subdural hematoma (HCC)     Functional Problem List: Nursing Bowel, Safety, Skin Integrity, Endurance, Medication Management, Motor, Pain, Bladder  PT Balance, Pain, Behavior, Perception, Safety, Endurance, Motor, Skin Integrity  OT Balance, Cognition, Endurance, Motor, Pain, Perception, Safety, Vision  SLP Cognition, Safety, Motor  TR         Basic ADL's: OT Grooming, Bathing, Dressing, Toileting, Eating     Advanced  ADL's: OT       Transfers: PT Bed Mobility, Bed to Chair, Customer service manager, Tub/Shower     Locomotion: PT Ambulation, Psychologist, prison and probation services, Stairs     Additional Impairments: OT Fuctional Use of Upper Extremity  SLP Communication, Social Cognition expression, comprehension Problem Solving, Memory, Attention, Awareness  TR      Anticipated Outcomes Item Anticipated Outcome  Self Feeding S  Swallowing      Basic self-care  MIN  Toileting  MIN   Bathroom Transfers CGA  Bowel/Bladder  continent B/B  Transfers  Supervision with LRAD  Locomotion  Supervision with LRAD  Communication  Supervision A  Cognition  Supervision A  Pain  less than 4  Safety/Judgment  remain fall free while in rehab   Therapy Plan: PT Intensity: Minimum of 1-2 x/day ,45 to 90 minutes PT Frequency: 5 out of 7 days PT Duration Estimated Length of Stay: 2 weeks OT Intensity: Minimum of 1-2 x/day, 45 to 90 minutes OT Frequency: 5 out of 7 days OT Duration/Estimated Length of Stay: 2-2.5 weeks SLP Intensity: Minumum of 1-2 x/day, 30 to 90 minutes SLP Frequency: 1 to 3 out of 7 days SLP Duration/Estimated Length of Stay: 16-18 days   Team Interventions: Nursing Interventions Patient/Family Education, Pain Management, Bladder Management, Medication Management, Discharge Planning, Bowel  Management, Skin Care/Wound Management, Psychosocial Support, Disease Management/Prevention, Cognitive Remediation/Compensation  PT interventions Ambulation/gait training, Cognitive remediation/compensation, Discharge planning, DME/adaptive equipment instruction, Functional mobility training, Pain management, Psychosocial support, Splinting/orthotics, Therapeutic Activities, UE/LE Strength taining/ROM, Visual/perceptual remediation/compensation, Wheelchair propulsion/positioning, UE/LE Coordination activities, Therapeutic Exercise, Stair training, Skin care/wound management, Patient/family education, Neuromuscular re-education, Functional electrical stimulation, Disease management/prevention, Firefighter, Warden/ranger  OT Interventions Warden/ranger, Fish farm manager, Patient/family education, Therapeutic Activities, Wheelchair propulsion/positioning, Therapeutic Exercise, Psychosocial support, Cognitive remediation/compensation, Community reintegration, Functional mobility training, Self Care/advanced ADL retraining, UE/LE Strength taining/ROM, UE/LE Coordination activities, Skin care/wound managment, Neuromuscular re-education, Discharge planning, Disease mangement/prevention, Pain management, Splinting/orthotics, Visual/perceptual remediation/compensation  SLP Interventions Cognitive remediation/compensation, Internal/external aids, Therapeutic Exercise, Therapeutic Activities, Patient/family education, Functional tasks, Cueing hierarchy  TR Interventions    SW/CM Interventions Discharge Planning, Psychosocial Support, Patient/Family Education   Barriers to Discharge MD  Medical stability  Nursing Decreased caregiver support, Inaccessible home environment, Home environment access/layout, Incontinence, Wound Care, Insurance for SNF coverage, Weight bearing restrictions, Medication compliance 2 level B/B up flight of steps; ramp entrance  PT  Inaccessible home environment, Decreased caregiver support, Home environment access/layout, Lack of/limited family support, Weight bearing restrictions, Behavior    OT Decreased caregiver support, Weight bearing restrictions    SLP Decreased caregiver support (reports state brother in Social worker (roommate) is disabled)    SW Community education officer for SNF coverage, Decreased caregiver support, Lack of/limited family support, Home environment Best boy, [employer] bearing restrictions     Team Discharge Planning: Destination: PT-Home ,OT- Home , SLP-Home Projected Follow-up: PT-Outpatient PT, OT-  Home health OT, SLP-24  hour supervision/assistance, Home Health SLP Projected Equipment Needs: PT-To be determined, OT- 3 in 1 bedside comode, Tub/shower bench, SLP-To be determined Equipment Details: PT- , OT-  Patient/family involved in discharge planning: PT- Patient,  OT-Patient, SLP-Patient  MD ELOS: 14-15 days Medical Rehab Prognosis:  Excellent Assessment: The patient has been admitted for CIR therapies with the diagnosis of TBI/polytrauma. The team will be addressing functional mobility, strength, stamina, balance, safety, adaptive techniques and equipment, self-care, bowel and bladder mgt, patient and caregiver education, NMR, cognition, communication, behavior. Goals have been set at supervision to min assist with self-care, mobility and cognition. Anticipated discharge destination is home.        See Team Conference Notes for weekly updates to the plan of care

## 2022-10-10 NOTE — Patient Instructions (Signed)
Complete 3 times a day. 10 time each.  HAND PUMPS  Hold your hand up as shown. Open and close your hand into a fist and repeat. If you cannot make a full fist, then make a partial fist. This can help with reducing swelling and stiffness.    Sponge Grip  Place sponge across your palm and gently squeeze all fingers down into the sponge.     WRIST FLEXION EXTENSION - FUNCTIONAL GRIP  Flex and extend wrist with relaxed fingers. When wrist moves back into extension, fingers should flex/grip on their own.    SLING - ELBOW FLEXION EXTENSION  Carefully take your arm out of the sling using your unaffected arm. Let your affected elbow straighten and allow gravity to stretch it. Then, bend your elbow back to the original bent position and repeat.

## 2022-10-10 NOTE — Progress Notes (Signed)
PROGRESS NOTE   Subjective/Complaints:  Reports his pain is controlled with current medications.  It appears he took the lower dose 5 mg oxycodone this morning. No new concerns or complaints today.  Last BM yesterday.  ROS: per HPI. +Constipation-improved. Denies fevers, chills, CP, SOB, cough, abdominal pain, N/V/D, new/worsening paresthesias/weakness, or any other complaints at this time.     Objective:   No results found. Recent Labs    10/08/22 0549 10/10/22 0602  WBC 3.8* 3.9*  HGB 10.8* 10.2*  HCT 31.8* 29.5*  PLT 210 209    Recent Labs    10/08/22 0549 10/10/22 0602  NA 134* 134*  K 3.7 3.4*  CL 102 102  CO2 23 24  GLUCOSE 99 99  BUN 16 13  CREATININE 0.99 1.00  CALCIUM 9.5 9.1     Intake/Output Summary (Last 24 hours) at 10/10/2022 1237 Last data filed at 10/10/2022 1124 Gross per 24 hour  Intake 592 ml  Output 600 ml  Net -8 ml         Physical Exam: Vital Signs Blood pressure 112/71, pulse (!) 55, temperature 98.7 F (37.1 C), temperature source Oral, resp. rate 16, height 5\' 11"  (1.803 m), weight 62.4 kg, SpO2 96 %.  Constitutional: No apparent distress. Appropriate appearance for age. + Disheveled appearance, lying in bed HENT: No JVD. Neck Supple. Trachea midline. Atraumatic, normocephalic. Eyes: PERRLA. EOMI. Visual fields grossly intact.  Nystagmus on left greater than right horizontal gaze Cardiovascular: RRR, no murmurs/rub/gallops. No Edema. Peripheral pulses 2+.  Right upper extremity capillary refill brisk. Respiratory: CTAB. No rales, rhonchi, or wheezing. On RA.  Abdomen: + bowel sounds, more normoactive. No distention or tenderness.  GU: Not examined. Skin: RUE surgical dressing intact.  MsK: R shoulder in sling. Some scattered bruises.  No abnormal tone noted Neuro: Dysarthria.  Alert and oriented to person and place.  Not oriented to date today  PRIOR EXAMS: MSK:       Right upper extremity sling      Strength:                RUE: Grip, finger abduction 5 out of 5                LUE: 5/5 SA, 5/5 EF, 5/5 EE, 5/5 WE, 5/5 FF, 5/5 FA                 RLE: 5/5 HF, 5/5 KE, 5/5 DF, 5/5 EHL, 5/5 PF                 LLE:  5/5 HF, 5/5 KE, 5/5 DF, 5/5 EHL, 5/5 PF    Neurologic exam:  Cognition: AAO to person, place; not time Language: Fluent, in short phrases.  + Dysarthria and occasional  neoglisms.Names 2/3 objects correctly.  Memory: Recalls 1/3 objects at 5 minutes.  Insight: Poor insight into current condition.  Mood: Flat affect, appropriate mood.  Sensation: To light touch intact in BL UEs and LEs  Reflexes: 2+ in BL UE and LEs. Negative Hoffman's and babinski signs bilaterally.  CN: Questionable mild left facial droop; otherwise intact Coordination: + ataxia on FTN, HTS bilaterally.  Spasticity: MAS 0 in all extremities.  Assessment/Plan: 1. Functional deficits which require 3+ hours per day of interdisciplinary therapy in a comprehensive inpatient rehab setting. Physiatrist is providing close team supervision and 24 hour management of active medical problems listed below. Physiatrist and rehab team continue to assess barriers to discharge/monitor patient progress toward functional and medical goals  Care Tool:  Bathing    Body parts bathed by patient: Right arm, Chest, Abdomen, Front perineal area, Right upper leg, Left upper leg, Face   Body parts bathed by helper: Left arm, Buttocks, Right lower leg, Left lower leg     Bathing assist Assist Level: Maximal Assistance - Patient 24 - 49%     Upper Body Dressing/Undressing Upper body dressing   What is the patient wearing?: Pull over shirt, Orthosis    Upper body assist Assist Level: Total Assistance - Patient < 25%    Lower Body Dressing/Undressing Lower body dressing      What is the patient wearing?: Incontinence brief, Pants     Lower body assist Assist for lower body dressing: Total  Assistance - Patient < 25%     Toileting Toileting    Toileting assist Assist for toileting: Maximal Assistance - Patient 25 - 49%     Transfers Chair/bed transfer  Transfers assist     Chair/bed transfer assist level: Moderate Assistance - Patient 50 - 74%     Locomotion Ambulation   Ambulation assist      Assist level: Moderate Assistance - Patient 50 - 74% Assistive device: Hand held assist Max distance: 264 ft   Walk 10 feet activity   Assist     Assist level: Moderate Assistance - Patient - 50 - 74% Assistive device: Hand held assist   Walk 50 feet activity   Assist    Assist level: Moderate Assistance - Patient - 50 - 74% Assistive device: Hand held assist    Walk 150 feet activity   Assist    Assist level: Moderate Assistance - Patient - 50 - 74% Assistive device: Hand held assist    Walk 10 feet on uneven surface  activity   Assist     Assist level: Moderate Assistance - Patient - 50 - 74% Assistive device: Hand held assist   Wheelchair     Assist Is the patient using a wheelchair?: Yes Type of Wheelchair: Manual    Wheelchair assist level: Supervision/Verbal cueing Max wheelchair distance: 150 ft    Wheelchair 50 feet with 2 turns activity    Assist        Assist Level: Supervision/Verbal cueing   Wheelchair 150 feet activity     Assist      Assist Level: Supervision/Verbal cueing   Blood pressure 112/71, pulse (!) 55, temperature 98.7 F (37.1 C), temperature source Oral, resp. rate 16, height 5\' 11"  (1.803 m), weight 62.4 kg, SpO2 96 %.  Medical Problem List and Plan: 1. Functional deficits secondary to traumatic SDH status post MMA embolization             -patient may shower             -ELOS/Goals: 16-18 days, Min A PT/OT/SLP  -Continue CIR  -Team conference tomorrow   2.  Antithrombotics: -DVT/anticoagulation:  Pharmaceutical: Lovenox 30 mg BID continued             -antiplatelet therapy:  none   3. Pain Management: Avoid Tylenol; oxycodone 5-10mg  q4h PRN, Robaxin PRN -10/08/22 Per pharmacy, was on gabapentin  outpatient; defer to primary team whether this is needing to be restarted. Also encouraged pt to utilize PRN pain meds since he's endorsing pain but no meds have been given -10/09/22 pain improving, remembered once yesterday to ask for pain meds 4/29 utilized 1 dose of 5 mg oxycodone this morning, pain gradually improving, will restart low-dose gabapentin 100 mg 3 times daily   4. Mood/Behavior/Sleep: LCSW to evaluate and provide emotional support             -antipsychotic agents: n/a  -Trazodone PRN -10/09/22 apparently some nighttime confusion and day/night confusion, napping a lot during the day; advised avoidance of naps during the day to keep sleep schedule better; will get sleep log tonight; monitor   5. Neuropsych/cognition: This patient is not capable of making decisions on his own behalf.   6. Skin/Wound Care: Routine skin care checks   7. Fluids/Electrolytes/Nutrition: Routine Is and Os and follow-up chemistries             -continue thiamine and folic acid supplements, and MVI             -10/08/22 Vit B12 level WNL at 865   8: Alcoholism: on Librium taper, ended prior to CIR admission; encourage cessation -10/09/22 some overnight confusion; wonder if it's from mild withdrawal vs daytime napping? Monitor for trend/pattern for now  4/29 restart low-dose gabapentin   9: History of gastric ulcers, non-bleeding: 2021 -continue Protonix 40mg  QD while hospitalized, sucralfate not restarted at this time   10: Hypertension: Monitor TID and prn; has been on lisinopril 10 mg in the past ([facility]>>he did not follow-up) but not restarted             -has been normotensive this admission  -4/29 well-controlled continue to monitor Vitals:   10/07/22 1602 10/07/22 1922 10/08/22 0502 10/08/22 2018  BP: 117/85 106/76 109/74 118/80   10/09/22 0521 10/09/22  1323 10/09/22 1909 10/10/22 0550  BP: 106/75 (!) 110/99 114/78 112/71      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 Conchita Paris   12: Right proximal humerus s/p ORIF 09/27/22              -NWB RUE, ok for AROM at elbow, wrist, and fingers             -keep in sling             -follow-up with Dr. Dion Saucier   13: Macrocytic anemia: Hgb stable at 10.8 on 10/08/22, monitor routine labs; B12 level WNL as above   14: Thrombocytopenia: improved on 10/08/22 labs, monitor on routine labs  15: Constipation: no BM in several days, or longer (?1wk) -10/08/22 still no BM, will give Sorbitol 60ml x1 now, monitor for effect; continue miralax 17g BID, senokot S 1 tab BID, and PRNs -10/09/22 pt states BM this AM, not well documented; monitor, asked nursing to please try to document Bms 4/29 improved, last BM yesterday  16: Hypokalemia  -4/29 will order 30 mEq potassium K-Dur    LOS: 3 days A FACE TO FACE EVALUATION WAS PERFORMED  Fanny Dance 10/10/2022, 12:37 PM

## 2022-10-10 NOTE — Care Management (Signed)
[facility] Individual Statement of Services  Patient Name:  Richard Andrews  Date:  10/10/2022  Welcome to the [facility].  Our goal is to provide you with an individualized program based on your diagnosis and situation, designed to meet your specific needs.  With this comprehensive rehabilitation program, you will be expected to participate in at least 3 hours of rehabilitation therapies Monday-Friday, with modified therapy programming on the weekends.  Your rehabilitation program will include the following services:  Physical Therapy (PT), Occupational Therapy (OT), Speech Therapy (ST), 24 hour per day rehabilitation nursing, Therapeutic Recreaction (TR), Psychology, Neuropsychology, Care Coordinator, Rehabilitation Medicine, Nutrition Services, Pharmacy Services, and Other  Weekly team conferences will be held on Tuesdays to discuss your progress.  Your Inpatient Rehabilitation Care Coordinator will talk with you frequently to get your input and to update you on team discussions.  Team conferences with you and your family in attendance may also be held.  Expected length of stay: 14 days    Overall anticipated outcome: Supervision to Minimal Assistance  Depending on your progress and recovery, your program may change. Your Inpatient Rehabilitation Care Coordinator will coordinate services and will keep you informed of any changes. Your Inpatient Rehabilitation Care Coordinator's name and contact numbers are listed  below.  The following services may also be recommended but are not provided by the [facility]:  Driving Evaluations Home Health Rehabiltiation Services Outpatient Rehabilitation Services Vocational Rehabilitation   Arrangements will be made to provide these services after discharge if needed.  Arrangements include referral to agencies that provide these services.  Your insurance has been verified to be:  [employer]  Your  primary doctor is:  [name]  Pertinent information will be shared with your doctor and your insurance company.  Inpatient Rehabilitation Care Coordinator:  Susie Cassette [phone] or [phone]  Information discussed with and copy given to patient by: Gretchen Short, 10/10/2022, 12:08 PM

## 2022-10-10 NOTE — Progress Notes (Signed)
Occupational Therapy TBI Note  Patient Details  Name: Richard Andrews MRN: [id number] Date of Birth: Nov 23, 1960  Session 1: Today's Date: 10/10/2022 OT Individual Time: 1020-1100 OT Individual Time Calculation (min): 40 min  Session 2: OT Individual Time: 1449-1530 OT Individual Time Calculation (min): 41 min  Short Term Goals: Session 1: Week 1:  OT Short Term Goal 1 (Week 1): Pt will complete 2/4 steps of UB dressing wiht MOD A OT Short Term Goal 2 (Week 1): Pt will don sling wiht MOD A OT Short Term Goal 3 (Week 1): Pt will thread BLE into pants wiht AE PRN OT Short Term Goal 4 (Week 1): Pt will stand at sink wiht no more than MIN A to demo improved balance during grooming tasks  Session 2: Patient agreeable to participate in OT session. Reports pain/discomfort in mid back. No number provided   Patient participated in skilled OT session focusing on ADL re-training, establishing HEP for RUE, and UE precautions education. Therapist educated pt on injury of RUE, surgery, and reviewed precautions. Education provided on UE HEP exercises including the hand, wrist, and elbow in order to improve carry over of short term memory and increase independence with HEP completion.   HEP exercises: - Hand /fingers open/close, wrist flexion/extension, elbow flexion/extension, blue foam block grip, 10X, 1 set  Pt completed grooming (brushing teeth) standing at sink with Min guard. Provided pt with VC to only use RUE to hole or stabilize items and use left hand to brush teeth.    Skilled Therapeutic Interventions/Progress Updates:    Patient agreeable to participate in OT session. Demonstrated no signs of pain.  Patient participated in skilled OT session focusing on Toileting, functional mobility, Manual therapy, and RUE ROM. Therapist educated pt on importance of elevating RUE when in bed while also using ice to help decrease swelling and pain in order to decrease amount of swelling and allow pt to  utilize his RUE for allowed mobility during daily tasks.  Pt completed all functional mobility during session with Min A and no device. Walked in room, bathroom, and to/from main gym during session. Provided VC for standing posture and noticed rock side to side. Toileting completed with Mod A. Toilet transfer: min A ambulating; grab bar used with regular height toilet.   Significant bruising and indurated tissue noted in left elbow both crease and medial and lateral aspect.  -Completed myofascial release and manual stretching to right elbow in order to decrease fascial restrictions and increase joint mobility in a pain free zone. - P/ROM/A/ROM: supine, right elbow flexion/extension, wrist supination/pronation, 10X Once returned to pt's room. Pt requested to lay in bed. Therapist provided assistance with propping right elbow up on pillows and ice pack was provided.    Therapy Documentation Precautions:  Precautions Precautions: Fall, Shoulder Shoulder Interventions: Shoulder sling/immobilizer, At all times, Off for dressing/bathing/exercises Precaution Comments: OK for AROM at elbow, wrist, and digits only Required Braces or Orthoses: Sling Restrictions Weight Bearing Restrictions: Yes RUE Weight Bearing: Non weight bearing Session 1: Agitated Behavior Scale: TBI Observation Details Observation Environment: pt's room Start of observation period - Date: 10/10/22 Start of observation period - Time: 1020 End of observation period - Date: 10/10/22 End of observation period - Time: 1100 Agitated Behavior Scale (DO NOT LEAVE BLANKS) Short attention span, easy distractibility, inability to concentrate: Present to a slight degree Impulsive, impatient, low tolerance for pain or frustration: Present to a slight degree Uncooperative, resistant to care, demanding: Absent Violent and/or threatening  violence toward people or property: Absent Explosive and/or unpredictable anger: Absent Rocking,  rubbing, moaning, or other self-stimulating behavior: Absent Pulling at tubes, restraints, etc.: Absent Wandering from treatment areas: Absent Restlessness, pacing, excessive movement: Absent Repetitive behaviors, motor, and/or verbal: Absent Rapid, loud, or excessive talking: Absent Sudden changes of mood: Absent Easily initiated or excessive crying and/or laughter: Absent Self-abusiveness, physical and/or verbal: Absent Agitated behavior scale total score: 16  Session 2:   10/10/22 1626  Observation Details  Observation Environment pt's room/main gym  Start of observation period - Date 10/10/22  Start of observation period - Time 1445  End of observation period - Date 10/10/22  End of observation period - Time 1530  Agitated Behavior Scale (DO NOT LEAVE BLANKS)  Short attention span, easy distractibility, inability to concentrate 1  Impulsive, impatient, low tolerance for pain or frustration 1  Uncooperative, resistant to care, demanding 1  Violent and/or threatening violence toward people or property 1  Explosive and/or unpredictable anger 1  Rocking, rubbing, moaning, or other self-stimulating behavior 1  Pulling at tubes, restraints, etc. 1  Wandering from treatment areas 1  Restlessness, pacing, excessive movement 1  Repetitive behaviors, motor, and/or verbal 1  Rapid, loud, or excessive talking 1  Sudden changes of mood 1  Easily initiated or excessive crying and/or laughter 1  Self-abusiveness, physical and/or verbal 1  Agitated behavior scale total score 14     Therapy/Group: Individual Therapy  Limmie Patricia, OTR/L,CBIS  Supplemental OT - [facility] and [facility] Secure Chat Preferred   10/10/2022, 4:25 PM

## 2022-10-10 NOTE — Progress Notes (Signed)
Physical Therapy TBI Note  Patient Details  Name: Richard Andrews MRN: [id number] Date of Birth: 1961/05/07  Today's Date: 10/10/2022 PT Individual Time: 4098-1191 and 4782-9562 PT Individual Time Calculation (min): 72 min and 40 min  Short Term Goals: Week 1:  PT Short Term Goal 1 (Week 1): Pt will ambulate >100 ft with min A or better PT Short Term Goal 2 (Week 1): Pt will remained seated OOB between sessions PT Short Term Goal 3 (Week 1): Pt will demo improved balance as quantified by ber score of 19 or better  Skilled Therapeutic Interventions/Progress Updates:      1st Session: Pt received semi reclined in bed and agrees to therapy. Reports slight pain in R upper extremity but reports he had had pain meds prior to therapy and pain "isn't too bad." Supine to sit with increased time and cues for sequencing and positioning. Pt performs sit to stand and stand step transfer to [facility] with minA and tactile cueing at trunk for stability and positioning. WC transport to gym for time management. Pt performs sit to stand with light minA and cues for initiation. Pt ambulates x180' without AD, with minA and cueing for upright gaze to improve posture and balance, attending to L side and utilizing head turns to assist with balance, and increasing L step height due to pt occasionally dragging foot during swing phase. Additional cueing provided for safe transition back to [facility] as pt attempts to sit prior to being safely in front of WC. Following seated rest break, pt stands again with minA and facilitation of anterior weight shift, then ambulates x220' with similar assistance and cues. Following extended seated rest break, pt stands and ambulates x373', with cues to increase hip and trunk extension to promote optimal posture and body mechanics, and increasing gait speed to improve balance. Pt noted to have slightly increasing anterior bias with fatigue, flexing forward at hips and trunk. Pt ambulates x575' with similar  presentation, beginning very slowly with wide base of support and as distance increases, pt begins leaning forward with increasing step cadence and decreased stability, requiring gradually increasing assistance to maintain balance (minA overall). Pt ambulates final bout of x375' with same presentation and cues required. WC transport back to room. Pt left seated in WC with alarm intact and all needs within reach.   2nd Session: Pt received seated in recliner and agrees to therapy. Reports pain in low back greater than R shoulder. PT alerts RN who provides pain meds at beginning of session. Pt performs sit to stand from recliner with minA and cues for positioning and anterior weight shift. Pt ambulates 2x150' with minA and cues for upright gaze and trunk and hip extension to improve posture and balance. Pt takes seated rest break. Pt then performs 2x20 alternating toe taps on orange cones to challenge dynamic standing balance, single legs stance, and coordination. PT provides minA and cues for weight shifting and increased hip flexion to clear cone. Seated rest breaks in between bouts. Pt ambulates x300' back to room with minA and similar cues. Sit to supine with cues for positioning and logrolling. Left supine with alarm intact and all needs within reach.   Therapy Documentation Precautions:  Precautions Precautions: Fall, Shoulder Shoulder Interventions: Shoulder sling/immobilizer, At all times, Off for dressing/bathing/exercises Precaution Comments: OK for AROM at elbow, wrist, and digits only Required Braces or Orthoses: Sling Restrictions Weight Bearing Restrictions: Yes RUE Weight Bearing: Non weight bearing  Agitated Behavior Scale: TBI Observation Details Observation  Environment: Pt's room Start of observation period - Date: 10/10/22 Start of observation period - Time: 1300 End of observation period - Date: 10/10/22 End of observation period - Time: 1345 Agitated Behavior Scale (DO NOT  LEAVE BLANKS) Short attention span, easy distractibility, inability to concentrate: Present to a slight degree Impulsive, impatient, low tolerance for pain or frustration: Absent Uncooperative, resistant to care, demanding: Absent Violent and/or threatening violence toward people or property: Absent Explosive and/or unpredictable anger: Absent Rocking, rubbing, moaning, or other self-stimulating behavior: Absent Pulling at tubes, restraints, etc.: Absent Wandering from treatment areas: Absent Restlessness, pacing, excessive movement: Absent Repetitive behaviors, motor, and/or verbal: Absent Rapid, loud, or excessive talking: Absent Sudden changes of mood: Absent Easily initiated or excessive crying and/or laughter: Absent Self-abusiveness, physical and/or verbal: Absent Agitated behavior scale total score: 15    Therapy/Group: Individual Therapy  Beau Fanny, PT, DPT 10/10/2022, 5:02 PM

## 2022-10-11 DIAGNOSIS — S065XAA Traumatic subdural hemorrhage with loss of consciousness status unknown, initial encounter: Secondary | ICD-10-CM | POA: Diagnosis not present

## 2022-10-11 NOTE — Progress Notes (Signed)
Physical Therapy Session Note  Patient Details  Name: Richard Andrews MRN: [id number] Date of Birth: 05-28-1961  Today's Date: 10/11/2022 PT Individual Time: 1301-1415 PT Individual Time Calculation (min): 74 min   Short Term Goals: Week 1:  PT Short Term Goal 1 (Week 1): Pt will ambulate >100 ft with min A or better PT Short Term Goal 2 (Week 1): Pt will remained seated OOB between sessions PT Short Term Goal 3 (Week 1): Pt will demo improved balance as quantified by ber score of 19 or better  Skilled Therapeutic Interventions/Progress Updates:  Patient greeted supine in bed finishing his lunch with mother and daughter present- Patient agreeable to PT treatment session. Patient transitioned to sitting EOB with supv and minor VC for not using his R UE. Patient stood from EOB without the use of an AD and gait trained >100' from his room to rehab gym with CGA.   Patient performed various gait training activities in order to improve dynamic stability for improved functional mobility- -Weaving between x10 cones, x6 trials with CGA/MinA for improved stability. Patient required multimodal cues throughout for proper sequencing and to avoid kicking over cones with his R foot. Patient knocked over 1-3 cones each trial with R foot. Seated rest break throughout secondary to fatigue.  -Patient tasked with ambulating throughout the hallway and locating various numbered discs (2-12, therapist could not located #1) on either side of the hallway and in numerical order. Minor VC for improved L foot clearance and body awareness since patient bumped into the wall a couples of times, especially as he fatigued.   Daughter pulled into treatment session in order to provide education regarding a car transfer and functional mobility in order for patient to have a ground's pass to his daughter's graduation- Patient performed car transfer, sit/stands and stand pivot transfer, as well gait ~50' all with CGA/MinA from  patient's daughter. She provided appropriate VC and safety awareness throughout all functional mobility tasks.   Patient gait trained back to his room without the use of an AD and CGA- Multimodal cues for improved postural extension, forward gaze, body awareness and L foot clearance. Patient left supine in bed with call bell within reach, bed alarm on and all needs met.    Therapy Documentation Precautions:  Precautions Precautions: Fall, Shoulder Shoulder Interventions: Shoulder sling/immobilizer, At all times, Off for dressing/bathing/exercises Precaution Comments: OK for AROM at elbow, wrist, and digits only Required Braces or Orthoses: Sling Restrictions Weight Bearing Restrictions: Yes RUE Weight Bearing: Non weight bearing  Pain: Patient denied pain throughout treatment session.   Therapy/Group: Individual Therapy  Chevy Virgo 10/11/2022, 8:37 AM

## 2022-10-11 NOTE — Patient Care Conference (Signed)
Inpatient RehabilitationTeam Conference and Plan of Care Update Date: 10/11/2022   Time: 10:33 AM    Patient Name: Richard Andrews      Medical Record Number: [id number]  Date of Birth: 1961/06/07 Sex: Male         Room/Bed: 4M13C/4M13C-01 Payor Info: Payor: [employer] / Plan: BCBS COMM PPO / Product Type: *No Product type* /    Admit Date/Time:  10/07/2022  3:58 PM  Primary Diagnosis:  Subdural hematoma [facility])  Hospital Problems: Principal Problem:   Subdural hematoma [facility])    Expected Discharge Date: Expected Discharge Date: 10/22/22  Team Members Present: Physician leading conference: Dr. Elijah Birk Social Worker Present: Cecile Sheerer, LCSWA Nurse Present: Vedia Pereyra, RN PT Present: Amedeo Plenty, PT OT Present: Kearney Hard, OT SLP Present: Feliberto Gottron, SLP PPS Coordinator present : Fae Pippin, SLP     Current Status/Progress Goal Weekly Team Focus  Bowel/Bladder   Pt is continent of bowel and bladder. LBM 4/28   Pt will remain continent of bowel and bladder   Assist with toileting q 2-4 hours and PRN    Swallow/Nutrition/ Hydration               ADL's   MOD A LB ADL, MAX A UB ADLs, walks with no device with Min guard/min assist   CGA/SBA overall. Min A dressing   manual therapy and passive ROM to Right elbow, edema management, pt education, functional cognitive skills, standing dynamic balance,    Mobility   Supv bed mobility; MinA transfers; MinA gait >150'   Supv for all functional mobility; MinA for stair mobility  Dynamic stability, endurance/activity tolerance, global strengthening, cognition, discharge planning, education, etc.    Communication   Mod-Max A for use of speech intelligibility strategies   Supervision   use of strategies at word and phrase level to improve intelligibility    Safety/Cognition/ Behavioral Observations  Rancho Level V-VI, Overall Max A   Supervision   functional problem solving, orientation  with use of external aids, sustained attention, recall of safety precautions    Pain   Pt with no complaints of pain   Remain pain free   Assess pain q shift and PRN    Skin   Incision right shoulder-steri-strips. Incision to right groin with SS drainage from unattached edges.  Incisions continue to heal and be free of infection/breakdown. Assess skin q shift and PRN.      Discharge Planning:  Pt will return to the home with his brother in law. Pt wife to assist with care needs despite not living together. SW will confirm there are no barriers to discharge.   Team Discussion: SDH. Rancho V-VI. Continence of B/B with time toileting. Monitoring sleep/wake cycles. Medication adjustments for pain control. Continue dressing changes. Right shoulder immobilizer-NWB. Encourage self feeding.   Patient on target to meet rehab goals: yes, Min A gait greater 150' w/o device.   *See Care Plan and progress notes for long and short-term goals.   Revisions to Treatment Plan:  Continue to work on strategies to improve endurance, activity tolerance, cognition and discharge education/planning.   Teaching Needs: Medications, safety, gait/transfer training, self care, skin care, etc.   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, poor pain control, hyperglycemia, hyponatremia, hypokalemia, RUE fracture, SDH, and constipation  Barriers to Discharge: Electrolyte abnormality;Medical stability;Self-care education;Uncontrolled Pain;Weight bearing restrictions  Barriers to Discharge Comments: RUE weightbearing restrictions, encephalopathy due to alcohol withdrawal, poor pain control Possible Resolutions to [employer]: Education per nursing and staff, encourage  day/night sleep cycle, medication assistance for symptoms of alcohol withdrawal   Continued Need for Acute Rehabilitation Level of Care: The patient requires daily medical management by a physician with specialized training in physical medicine and rehabilitation for the following reasons: Direction of a multidisciplinary physical rehabilitation program to maximize functional independence : Yes Medical management of patient stability for increased activity during participation in an intensive rehabilitation regime.: Yes Analysis of laboratory values and/or radiology reports with any subsequent need for medication adjustment and/or medical intervention. : Yes   I attest that I was present, lead the team conference, and concur with the assessment and plan of the team.   Jearld Adjutant 10/11/2022, 2:38 PM

## 2022-10-11 NOTE — Progress Notes (Addendum)
Patient ID: Richard Andrews, male   DOB: 07-21-1960, 62 y.o.   MRN: [id number]  SW received message from pt dtr Richard Andrews asking if her father could go to her graduation on Thursday.   SW discussed with medical team and agreeable to grounds pass for 2-3 hours per attending, and pt dtr will need to come in for family edu.   *SW returned phone call to pt dtr Richard Andrews to discuss above. She reports she is here now. Therapy will begin family edu with her now to make her familiar with his care needs. She repots she has a w/c already. SW briefly shared update from team conference and d/c date 5/11.  1336-SW spoke with pt wife Richard Andrews to discuss above. SW shared family education will be needed closer towards discharge and SW will follow-up with updates after team conference next week.  SW met with pt in room to discuss updates from team conference, and above.   SW will continue to provide updates.   Cecile Sheerer, MSW, LCSWA Office: [phone] Cell: [phone] Fax: [phone]

## 2022-10-11 NOTE — Progress Notes (Signed)
Occupational Therapy Session Note  Patient Details  Name: Richard Andrews MRN: [id number] Date of Birth: July 07, 1960  Today's Date: 10/11/2022 OT Individual Time: 1135-1200 OT Individual Time Calculation (min): 25 min    Short Term Goals: Week 1:  OT Short Term Goal 1 (Week 1): Pt will complete 2/4 steps of UB dressing wiht MOD A OT Short Term Goal 2 (Week 1): Pt will don sling wiht MOD A OT Short Term Goal 3 (Week 1): Pt will thread BLE into pants wiht AE PRN OT Short Term Goal 4 (Week 1): Pt will stand at sink wiht no more than MIN A to demo improved balance during grooming tasks  Skilled Therapeutic Interventions/Progress Updates:     Pt received in bed with no reported pain   Therapeutic activity Increased time to imitate OOB mobility and don shoes with cuing for LUE only as pt still trying to reach down to shoes with RUE. Pt completes functional mobility with increased post bias with initail ssit to stand and reliant on bed/EOM to recovery with CGA. Otherwise mobilization with no AD and sling on LUE with 1 instance of L toes catching which pt recovers with no A. Seated graded pipe tree for visual spatial awareness with mod-max cuing d/t pt distracted by extra pieces. Pt unable to name 1 difference between his figure and picture (there were 2 differences).  Pt left at end of session in bed with exit alarm on, call light in reach and all needs met   Therapy Documentation Precautions:  Precautions Precautions: Fall, Shoulder Shoulder Interventions: Shoulder sling/immobilizer, At all times, Off for dressing/bathing/exercises Precaution Comments: OK for AROM at elbow, wrist, and digits only Required Braces or Orthoses: Sling Restrictions Weight Bearing Restrictions: Yes RUE Weight Bearing: Non weight bearing  Therapy/Group: Individual Therapy  Shon Hale 10/11/2022, 6:52 AM

## 2022-10-11 NOTE — Progress Notes (Signed)
Occupational Therapy Session Note  Patient Details  Name: Richard Andrews MRN: [id number] Date of Birth: 1960-09-10  Today's Date: 10/11/2022 OT Individual Time: 7829-5621 OT Individual Time Calculation (min): 42 min    Short Term Goals: Week 1:  OT Short Term Goal 1 (Week 1): Pt will complete 2/4 steps of UB dressing wiht MOD A OT Short Term Goal 2 (Week 1): Pt will don sling wiht MOD A OT Short Term Goal 3 (Week 1): Pt will thread BLE into pants wiht AE PRN OT Short Term Goal 4 (Week 1): Pt will stand at sink wiht no more than MIN A to demo improved balance during grooming tasks  Skilled Therapeutic Interventions/Progress Updates:    Pt greeted semi-reclined in bed awake and agreeable to OT treatment session. Pt declined to bathe or dress today.. Pt completed bed mobility with supervision. He then needed OT assist to adjust his R sling as it was not in place. Pt stood with min A with posterior  bias. He ambulated to therapy gym without AD and min A progressing to CGA once he "warmed up." Balance activity completed using clothes pins and reaching outside base of support to gather them.  Addressed memory with BITS activity. Correlating words and images in increasing order, then having to recall the words in order. Pt able to get up to 4 words in a row with moderate cues, progressing to min cues. Trail making activity on BITS alternating letters and numbers. Pt able to alternate letters and numbers 1-8 with min cues, but needed moderate cues with increased number of options. Pt returned to room ambulating with min A. Pt left semi-reclined in bed with bed alarm on, call bell in reach, and needs met.    Therapy Documentation Precautions:  Precautions Precautions: Fall, Shoulder Shoulder Interventions: Shoulder sling/immobilizer, At all times, Off for dressing/bathing/exercises Precaution Comments: OK for AROM at elbow, wrist, and digits only Required Braces or Orthoses:  Sling Restrictions Weight Bearing Restrictions: Yes RUE Weight Bearing: Non weight bearing Pain:  Back pain, no number given rest and repositioned   Therapy/Group: Individual Therapy  [name] 10/11/2022, 9:11 AM

## 2022-10-11 NOTE — Progress Notes (Signed)
Speech Language Pathology TBI Note  Patient Details  Name: Richard Andrews MRN: [id number] Date of Birth: Nov 08, 1960  Today's Date: 10/11/2022 SLP Individual Time: 4098-1191 SLP Individual Time Calculation (min): 42 min  Short Term Goals: Week 1: SLP Short Term Goal 1 (Week 1): Pt will increase orientation to time, place and recent medical events with min A verbal and visual cues SLP Short Term Goal 2 (Week 1): Pt will demonstrate sustained attention for 4-6 minutes during functional tasks provided mod A cues SLP Short Term Goal 3 (Week 1): Pt will utilize speech intelligibility strategies as trained with mod A cues for 75% intellibility at simple sentence level SLP Short Term Goal 4 (Week 1): Pt will follow 2-3 step directions during functional tasks with min A cues SLP Short Term Goal 5 (Week 1): Pt will ID 2 physical and 2 cognitive changes s/p fall/hospitalization with mod A cues SLP Short Term Goal 6 (Week 1): Pt will recall functional, novel information (weight bearing precautions, etc) with 50% accuracy provided mod A verbal cues  Skilled Therapeutic Interventions: Skilled treatment session focused on cognitive and communication goals. Upon arrival, patient was awake in bed. SLP facilitated session by providing Mod verbal cues for patient to utilize an increased vocal intensity at the sentence level to achieve ~75% intelligibility.  Patient's overall verbal expression appeared intact without evidence of word-finding deficits or paraphasias. SLP also facilitated session by providing Mod verbal cues for error awareness during a basic money management task. Patient able to perform verbal problem solving for calculations independently but demonstrated difficulty with counting out the specific amounts, suspect due to decreased working memory throughout task. Patient with poor awareness regarding difficulty of task as well as decreased awareness regarding his cognitive deficits and their impact on his  overall function as he reported he plans on driving, etc at discharge. Patient left upright in bed with alarm on and all needs within reach. Continue with current plan of care.      Pain No/Denies Pain   Agitated Behavior Scale: TBI Observation Details Observation Environment: CIR Start of observation period - Date: 10/11/22 Start of observation period - Time: 0731 End of observation period - Date: 10/11/22 End of observation period - Time: 0813 Agitated Behavior Scale (DO NOT LEAVE BLANKS) Short attention span, easy distractibility, inability to concentrate: Present to a slight degree Impulsive, impatient, low tolerance for pain or frustration: Absent Uncooperative, resistant to care, demanding: Absent Violent and/or threatening violence toward people or property: Absent Explosive and/or unpredictable anger: Absent Rocking, rubbing, moaning, or other self-stimulating behavior: Absent Pulling at tubes, restraints, etc.: Absent Wandering from treatment areas: Absent Restlessness, pacing, excessive movement: Absent Repetitive behaviors, motor, and/or verbal: Absent Rapid, loud, or excessive talking: Absent Sudden changes of mood: Absent Easily initiated or excessive crying and/or laughter: Absent Self-abusiveness, physical and/or verbal: Absent Agitated behavior scale total score: 15  Therapy/Group: Individual Therapy  Knute Mazzuca 10/11/2022, 10:18 AM

## 2022-10-11 NOTE — Progress Notes (Signed)
Orthopedic Tech Progress Note Patient Details:  Richard Andrews Sep 15, 1960 [id number]  Ortho Devices Type of Ortho Device: Arm sling Ortho Device/Splint Location: RUE Ortho Device/Splint Interventions: Ordered, Application, Adjustment   Post Interventions Patient Tolerated: Well Instructions Provided: Care of device  [name] 10/11/2022, 3:49 PM

## 2022-10-11 NOTE — Progress Notes (Signed)
Occupational Therapy TBI Note  Patient Details  Name: Richard Andrews MRN: [id number] Date of Birth: [birth year]  Today's Date: 10/11/2022 OT Individual Time: 1005-1035 OT Individual Time Calculation (min): 30 min    Short Term Goals: Week 1:  OT Short Term Goal 1 (Week 1): Pt will complete 2/4 steps of UB dressing wiht MOD A OT Short Term Goal 2 (Week 1): Pt will don sling wiht MOD A OT Short Term Goal 3 (Week 1): Pt will thread BLE into pants wiht AE PRN OT Short Term Goal 4 (Week 1): Pt will stand at sink wiht no more than MIN A to demo improved balance during grooming tasks  Skilled Therapeutic Interventions/Progress Updates:    Pt received supine with no c/o pain, agreeable to OT session. Sling half on- readjusted to ensure immobilization. He completed functional mobility to the therapy gym with CGA, no device. Worked initially on R elbow extension, pt still missing about 10 degrees of extension. Stabilization provided to ensure no GH movement during AAROM elbow flex/ext. He then completed dynamic balance training using cobblefoam pads- requiring mod cueing (poor carryover) and min-mod A at the trunk to prevent LOB. Ended with 250 ft of functional mobility to address functional activity tolerance, cueing for increasing pacing. Pt left supine with all needs met, bed alarm set.   Therapy Documentation Precautions:  Precautions Precautions: Fall, Shoulder Shoulder Interventions: Shoulder sling/immobilizer, At all times, Off for dressing/bathing/exercises Precaution Comments: OK for AROM at elbow, wrist, and digits only Required Braces or Orthoses: Sling Restrictions Weight Bearing Restrictions: Yes RUE Weight Bearing: Non weight bearing  Agitated Behavior Scale: TBI   Agitated Behavior Scale (DO NOT LEAVE BLANKS) Short attention span, easy distractibility, inability to concentrate: Present to a slight degree Impulsive, impatient, low tolerance for pain or frustration:  Absent Uncooperative, resistant to care, demanding: Absent Violent and/or threatening violence toward people or property: Absent Explosive and/or unpredictable anger: Absent Rocking, rubbing, moaning, or other self-stimulating behavior: Absent Pulling at tubes, restraints, etc.: Absent Wandering from treatment areas: Absent Restlessness, pacing, excessive movement: Absent Repetitive behaviors, motor, and/or verbal: Absent Rapid, loud, or excessive talking: Absent Sudden changes of mood: Absent Easily initiated or excessive crying and/or laughter: Absent Self-abusiveness, physical and/or verbal: Absent Agitated behavior scale total score: 15   Therapy/Group: Individual Therapy  Crissie Reese 10/11/2022, 6:47 AM

## 2022-10-11 NOTE — Progress Notes (Signed)
PROGRESS NOTE   Subjective/Complaints:  Patient per nursing does not want pain medication before therapies; only after.  No acute complaints.  No events overnight.  When prompted, does complain of ongoing pain in right upper extremity, around shoulder.  Per sleep log, slept 10 hours overnight.  States he is well rested, no issues during the daytime, eating well, last adequate bowel movement yesterday.   ROS:  +Constipation-improved.  +Insomnia-resolved + Pain -ongoing, improved Denies fevers, chills, CP, SOB, cough, abdominal pain, N/V/D, new/worsening paresthesias/weakness, or any other complaints at this time.     Objective:   No results found. Recent Labs    10/10/22 0602  WBC 3.9*  HGB 10.2*  HCT 29.5*  PLT 209    Recent Labs    10/10/22 0602  NA 134*  K 3.4*  CL 102  CO2 24  GLUCOSE 99  BUN 13  CREATININE 1.00  CALCIUM 9.1     Intake/Output Summary (Last 24 hours) at 10/11/2022 1029 Last data filed at 10/11/2022 0820 Gross per 24 hour  Intake 594 ml  Output 425 ml  Net 169 ml         Physical Exam: Vital Signs Blood pressure 101/67, pulse (!) 52, temperature 98.4 F (36.9 C), resp. rate 16, height 5\' 11"  (1.803 m), weight 62.4 kg, SpO2 99 %.  Constitutional: No apparent distress. Appropriate appearance for age.  Lying in bed HENT: No JVD. Neck Supple. Trachea midline. Atraumatic, normocephalic. Eyes: PERRLA. EOMI. Visual fields grossly intact.  Nystagmus on left greater than right horizontal gaze Cardiovascular: RRR, no murmurs/rub/gallops. No Edema. Peripheral pulses 2+.  Right upper extremity capillary refill brisk. Respiratory: CTAB. No rales, rhonchi, or wheezing. On RA.  Abdomen: + bowel sounds, more normoactive. No distention or tenderness.  GU: Not examined. Skin: RUE surgical dressing intact, underlying Steri-Strips appear clean.  Mild bruising around right shoulder, wrist. MsK: R  shoulder in sling.  Full active range of motion of right hand, wrist      Strength:                RUE: Grip, finger abduction 5 out of 5                LUE: 5/5 SA, 5/5 EF, 5/5 EE, 5/5 WE, 5/5 FF, 5/5 FA                 RLE: 5/5 HF, 5/5 KE, 5/5 DF, 5/5 EHL, 5/5 PF                 LLE:  5/5 HF, 5/5 KE, 5/5 DF, 5/5 EHL, 5/5 PF    Neurologic exam:  Cognition: AAO to person, place; not time -ongoing Language: Fluent, in short phrases.  + Dysarthria and occasional  neoglisms. -improved + Memory deficit - improving   Insight: Poor insight into current condition.  Mood: Flat affect, appropriate mood.  Sensation: To light touch intact in BL UEs and LEs  Reflexes: 2+ in BL UE and LEs.   CN: + mild left facial droop; otherwise intact Coordination: + ataxia on FTN, HTS bilaterally.  Spasticity: MAS 0 in all extremities.  Assessment/Plan: 1. Functional deficits  which require 3+ hours per day of interdisciplinary therapy in a comprehensive inpatient rehab setting. Physiatrist is providing close team supervision and 24 hour management of active medical problems listed below. Physiatrist and rehab team continue to assess barriers to discharge/monitor patient progress toward functional and medical goals  Care Tool:  Bathing    Body parts bathed by patient: Right arm, Chest, Abdomen, Front perineal area, Right upper leg, Left upper leg, Face   Body parts bathed by helper: Left arm, Buttocks, Right lower leg, Left lower leg     Bathing assist Assist Level: Maximal Assistance - Patient 24 - 49%     Upper Body Dressing/Undressing Upper body dressing   What is the patient wearing?: Pull over shirt, Orthosis    Upper body assist Assist Level: Total Assistance - Patient < 25%    Lower Body Dressing/Undressing Lower body dressing      What is the patient wearing?: Incontinence brief, Pants     Lower body assist Assist for lower body dressing: Total Assistance - Patient < 25%      Toileting Toileting    Toileting assist Assist for toileting: Maximal Assistance - Patient 25 - 49%     Transfers Chair/bed transfer  Transfers assist     Chair/bed transfer assist level: Moderate Assistance - Patient 50 - 74%     Locomotion Ambulation   Ambulation assist      Assist level: Moderate Assistance - Patient 50 - 74% Assistive device: Hand held assist Max distance: 264 ft   Walk 10 feet activity   Assist     Assist level: Moderate Assistance - Patient - 50 - 74% Assistive device: Hand held assist   Walk 50 feet activity   Assist    Assist level: Moderate Assistance - Patient - 50 - 74% Assistive device: Hand held assist    Walk 150 feet activity   Assist    Assist level: Moderate Assistance - Patient - 50 - 74% Assistive device: Hand held assist    Walk 10 feet on uneven surface  activity   Assist     Assist level: Moderate Assistance - Patient - 50 - 74% Assistive device: Hand held assist   Wheelchair     Assist Is the patient using a wheelchair?: Yes Type of Wheelchair: Manual    Wheelchair assist level: Supervision/Verbal cueing Max wheelchair distance: 150 ft    Wheelchair 50 feet with 2 turns activity    Assist        Assist Level: Supervision/Verbal cueing   Wheelchair 150 feet activity     Assist      Assist Level: Supervision/Verbal cueing   Blood pressure 101/67, pulse (!) 52, temperature 98.4 F (36.9 C), resp. rate 16, height 5\' 11"  (1.803 m), weight 62.4 kg, SpO2 99 %.  Medical Problem List and Plan: 1. Functional deficits secondary to traumatic SDH status post MMA embolization             -patient may shower             -ELOS/Goals: 16-18 days, Min A PT/OT/SLP - DC date 5/11  -Continue CIR  -Plan is home with brother; on STD  4/30: ADLs limited by cognitive deficits to CGA; Min A for ambulation; may be upgfrading goals for stand mobility; SLP memory and awareness deficits limiting;  low vocal intensity baseline?  Asking for grounds pass tomorrow a.m. for daughter's graduation; should be okay, will hold off on order for therapy input on whether  patient needs to be maintained full supervision at a wheelchair level   2.  Antithrombotics: -DVT/anticoagulation:  Pharmaceutical: Lovenox 30 mg BID continued             -antiplatelet therapy: none   3. Pain Management: Avoid Tylenol; oxycodone 5-10mg  q4h PRN, Robaxin PRN -10/08/22 Per pharmacy, was on gabapentin outpatient; defer to primary team whether this is needing to be restarted. Also encouraged pt to utilize PRN pain meds since he's endorsing pain but no meds have been given -10/09/22 pain improving, remembered once yesterday to ask for pain meds 4/29 utilized 1 dose of 5 mg oxycodone this morning, pain gradually improving, will restart low-dose gabapentin 100 mg 3 times daily 4-30: Pain well-controlled, monitor   4. Mood/Behavior/Sleep: LCSW to evaluate and provide emotional support             -antipsychotic agents: n/a  -Trazodone PRN -10/09/22 apparently some nighttime confusion and day/night confusion, napping a lot during the day; advised avoidance of naps during the day to keep sleep schedule better; will get sleep log tonight; monitor 4-30: Sleep log shows sleeping 10+ hours per night.  Feeling well rested.  Likely secondary to substance withdrawal.   5. Neuropsych/cognition: This patient is not capable of making decisions on his own behalf.   6. Skin/Wound Care: Routine skin care checks   7. Fluids/Electrolytes/Nutrition: Routine Is and Os and follow-up chemistries             -continue thiamine and folic acid supplements, and MVI             -10/08/22 Vit B12 level WNL at 865   8: Alcoholism: on Librium taper, ended prior to CIR admission; encourage cessation -10/09/22 some overnight confusion; wonder if it's from mild withdrawal vs daytime napping? Monitor for trend/pattern for now 4/29 restart low-dose  gabapentin -improved, monitor   9: History of gastric ulcers, non-bleeding: 2021 -continue Protonix 40mg  QD while hospitalized, sucralfate not restarted at this time   10: Hypertension: Monitor TID and prn; has been on lisinopril 10 mg in the past ([facility]>>he did not follow-up) but not restarted             -has been normotensive this admission  -4/29 well-controlled continue to monitor Vitals:   10/07/22 1602 10/07/22 1922 10/08/22 0502 10/08/22 2018  BP: 117/85 106/76 109/74 118/80   10/09/22 0521 10/09/22 1323 10/09/22 1909 10/10/22 0550  BP: 106/75 (!) 110/99 114/78 112/71   10/10/22 1254 10/10/22 2001 10/11/22 0526  BP: 103/79 92/66 101/67      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 Conchita Paris   12: Right proximal humerus s/p ORIF 09/27/22              -NWB RUE, ok for AROM at elbow, wrist, and fingers             -keep in sling -ordering alternate sling due to it being soiled 4-30             -follow-up with Dr. Dion Saucier   13: Macrocytic anemia: Hgb stable at 10.8 on 10/08/22, monitor routine labs; B12 level WNL as above   14: Thrombocytopenia: improved on 10/08/22 labs, monitor on routine labs  15: Constipation: no BM in several days, or longer (?1wk) -10/08/22 still no BM, will give Sorbitol 60ml x1 now, monitor for effect; continue miralax 17g BID, senokot S  1 tab BID, and PRNs -10/09/22 pt states BM this AM, not well documented; monitor, asked nursing to please try to document Bms 4/30 L BM     16: Hypokalemia  -4/29 will order 30 mEq potassium K-Dur -recheck in a.m.    LOS: 4 days A FACE TO FACE EVALUATION WAS PERFORMED  Angelina Sheriff 10/11/2022, 10:29 AM

## 2022-10-12 DIAGNOSIS — S065XAA Traumatic subdural hemorrhage with loss of consciousness status unknown, initial encounter: Secondary | ICD-10-CM | POA: Diagnosis not present

## 2022-10-12 LAB — BASIC METABOLIC PANEL
Anion gap: 8 (ref 5–15)
BUN: 10 mg/dL (ref 8–23)
CO2: 25 mmol/L (ref 22–32)
Calcium: 9.1 mg/dL (ref 8.9–10.3)
Chloride: 103 mmol/L (ref 98–111)
Creatinine, Ser: 0.95 mg/dL (ref 0.61–1.24)
GFR, Estimated: >60 mL/min (ref >60)
Glucose, Bld: 99 mg/dL (ref 70–99)
Potassium: 3.8 mmol/L (ref 3.5–5.1)
Sodium: 136 mmol/L (ref 135–145)

## 2022-10-12 NOTE — Progress Notes (Signed)
Speech Language Pathology TBI Note  Patient Details  Name: Richard Andrews MRN: [id number] Date of Birth: [birth year]  Today's Date: 10/12/2022 SLP Individual Time: 4098-1191 SLP Individual Time Calculation (min): 43 min  Short Term Goals: Week 1: SLP Short Term Goal 1 (Week 1): Pt will increase orientation to time, place and recent medical events with min A verbal and visual cues SLP Short Term Goal 2 (Week 1): Pt will demonstrate sustained attention for 4-6 minutes during functional tasks provided mod A cues SLP Short Term Goal 3 (Week 1): Pt will utilize speech intelligibility strategies as trained with mod A cues for 75% intellibility at simple sentence level SLP Short Term Goal 4 (Week 1): Pt will follow 2-3 step directions during functional tasks with min A cues SLP Short Term Goal 5 (Week 1): Pt will ID 2 physical and 2 cognitive changes s/p fall/hospitalization with mod A cues SLP Short Term Goal 6 (Week 1): Pt will recall functional, novel information (weight bearing precautions, etc) with 50% accuracy provided mod A verbal cues  Skilled Therapeutic Interventions: Skilled treatment session focused on cognitive goals. Upon arrival, patient's chair alarm was alarming as patient had gotten up from the wheelchair and transferred himself back to bed without assistance. SLP reinforced the importance of calling for assistance prior to mobility to maximize safety. Patient verbalized understanding but will need reinforcement. Patient agreeable to treatment session and transferred back to his wheelchair with CGA. SLP facilitated session by providing overall Mod A verbal cues for functional problem solving during a mildly complex calendar task in which patient had to transfer information from potential appointments due to a monthly calendar. Patient required Mod verbal cues for error awareness and to self-correct. Patient continues to demonstrate decreased anticipatory awareness and plans on returning to  work upon discharge. Patient provided a vague list of tasks he is responsible for at work, most of which involve the computer. Discussed with patient focusing cognitive tasks on things that involve the computer and organization. Patient verbalized understanding and agreement. Patient left upright in bed with alarm on and all needs within reach. Continue with current plan of care.      Pain Pain Assessment Pain Score: 0-No pain  Agitated Behavior Scale: TBI Observation Details Observation Environment: CIR Start of observation period - Date: 10/12/22 Start of observation period - Time: 1030 End of observation period - Date: 10/12/22 End of observation period - Time: 1113 Agitated Behavior Scale (DO NOT LEAVE BLANKS) Short attention span, easy distractibility, inability to concentrate: Present to a slight degree Impulsive, impatient, low tolerance for pain or frustration: Absent Uncooperative, resistant to care, demanding: Absent Violent and/or threatening violence toward people or property: Absent Explosive and/or unpredictable anger: Absent Rocking, rubbing, moaning, or other self-stimulating behavior: Absent Pulling at tubes, restraints, etc.: Absent Wandering from treatment areas: Absent Restlessness, pacing, excessive movement: Absent Repetitive behaviors, motor, and/or verbal: Absent Rapid, loud, or excessive talking: Absent Sudden changes of mood: Absent Easily initiated or excessive crying and/or laughter: Absent Self-abusiveness, physical and/or verbal: Absent Agitated behavior scale total score: 15  Therapy/Group: Individual Therapy  Rush Salce 10/12/2022, 4:19 PM

## 2022-10-12 NOTE — Progress Notes (Signed)
PROGRESS NOTE   Subjective/Complaints:  No acute complaints.  No events overnight.  Patient states that his pain is well-controlled on current regimen.  Sleeping well, eating well.  Had an opportunity to train with his daughter yesterday in anticipation of leaving the unit to attend his daughter's graduation on Friday.  Feels confident with maintaining wheelchair level and transitioning only for toileting, and with assistance.  ROS:  +Constipation-improved.  +Insomnia-resolved + Pain -ongoing, improved Denies fevers, chills, CP, SOB, cough, abdominal pain, N/V/D, new/worsening paresthesias/weakness, or any other complaints at this time.     Objective:   No results found. Recent Labs    10/10/22 0602  WBC 3.9*  HGB 10.2*  HCT 29.5*  PLT 209    Recent Labs    10/10/22 0602 10/12/22 0624  NA 134* 136  K 3.4* 3.8  CL 102 103  CO2 24 25  GLUCOSE 99 99  BUN 13 10  CREATININE 1.00 0.95  CALCIUM 9.1 9.1     Intake/Output Summary (Last 24 hours) at 10/12/2022 0942 Last data filed at 10/12/2022 0740 Gross per 24 hour  Intake 709 ml  Output 200 ml  Net 509 ml         Physical Exam: Vital Signs Blood pressure 117/69, pulse (!) 59, temperature 98.4 F (36.9 C), resp. rate 18, height 5\' 11"  (1.803 m), weight 62.7 kg, SpO2 98 %.  Constitutional: No apparent distress. Appropriate appearance for age.  Sitting up in bed. HENT: No JVD. Neck Supple. Trachea midline. Atraumatic, normocephalic. Eyes: PERRLA. EOMI. Visual fields grossly intact.  Nystagmus on left greater than right horizontal gaze -improved Cardiovascular: RRR, no murmurs/rub/gallops. No Edema.  Right upper extremity capillary refill brisk. Respiratory: CTAB. No rales, rhonchi, or wheezing. On RA.  Abdomen: + bowel sounds, more normoactive. No distention or tenderness.  GU: Not examined. Skin: RUE surgical dressing intact, underlying Steri-Strips appear  clean.  Mild bruising around right shoulder, wrist. MsK: R shoulder in sling.  Full active range of motion of right hand, wrist      Strength:                RUE: Grip, finger abduction 5 out of 5                LUE: 5/5 SA, 5/5 EF, 5/5 EE, 5/5 WE, 5/5 FF, 5/5 FA                 RLE: 5/5 HF, 5/5 KE, 5/5 DF, 5/5 EHL, 5/5 PF                 LLE:  5/5 HF, 5/5 KE, 5/5 DF, 5/5 EHL, 5/5 PF    Neurologic exam:  Cognition: AAO to person, place, and time. + Lethargy, mild delay Language: Fluent, in short phrases.   + Dysarthria and occasional  neoglisms. -improved + Memory deficit - improving   Insight: Poor insight into current condition.  Mood: Flat affect, appropriate mood.  Sensation: To light touch intact in BL UEs and LEs  Reflexes: 2+ in BL UE and LEs.   CN: + mild left facial droop; otherwise intact Coordination: + ataxia on FTN, HTS  bilaterally.  Spasticity: MAS 0 in all extremities.  Assessment/Plan: 1. Functional deficits which require 3+ hours per day of interdisciplinary therapy in a comprehensive inpatient rehab setting. Physiatrist is providing close team supervision and 24 hour management of active medical problems listed below. Physiatrist and rehab team continue to assess barriers to discharge/monitor patient progress toward functional and medical goals  Care Tool:  Bathing    Body parts bathed by patient: Right arm, Chest, Abdomen, Front perineal area, Right upper leg, Left upper leg, Face   Body parts bathed by helper: Left arm, Buttocks, Right lower leg, Left lower leg     Bathing assist Assist Level: Maximal Assistance - Patient 24 - 49%     Upper Body Dressing/Undressing Upper body dressing   What is the patient wearing?: Pull over shirt, Orthosis    Upper body assist Assist Level: Total Assistance - Patient < 25%    Lower Body Dressing/Undressing Lower body dressing      What is the patient wearing?: Incontinence brief, Pants     Lower body assist  Assist for lower body dressing: Total Assistance - Patient < 25%     Toileting Toileting    Toileting assist Assist for toileting: Maximal Assistance - Patient 25 - 49%     Transfers Chair/bed transfer  Transfers assist     Chair/bed transfer assist level: Moderate Assistance - Patient 50 - 74%     Locomotion Ambulation   Ambulation assist      Assist level: Moderate Assistance - Patient 50 - 74% Assistive device: Hand held assist Max distance: 264 ft   Walk 10 feet activity   Assist     Assist level: Moderate Assistance - Patient - 50 - 74% Assistive device: Hand held assist   Walk 50 feet activity   Assist    Assist level: Moderate Assistance - Patient - 50 - 74% Assistive device: Hand held assist    Walk 150 feet activity   Assist    Assist level: Moderate Assistance - Patient - 50 - 74% Assistive device: Hand held assist    Walk 10 feet on uneven surface  activity   Assist     Assist level: Moderate Assistance - Patient - 50 - 74% Assistive device: Hand held assist   Wheelchair     Assist Is the patient using a wheelchair?: Yes Type of Wheelchair: Manual    Wheelchair assist level: Supervision/Verbal cueing Max wheelchair distance: 150 ft    Wheelchair 50 feet with 2 turns activity    Assist        Assist Level: Supervision/Verbal cueing   Wheelchair 150 feet activity     Assist      Assist Level: Supervision/Verbal cueing   Blood pressure 117/69, pulse (!) 59, temperature 98.4 F (36.9 C), resp. rate 18, height 5\' 11"  (1.803 m), weight 62.7 kg, SpO2 98 %.  Medical Problem List and Plan: 1. 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  - grounds pass 5/3 a.m. for daughter's graduation; has  completed family training and will be maintained at wheelchair level with min assist to CGA for transfers   2.  Antithrombotics: -DVT/anticoagulation:  Pharmaceutical: Lovenox 30 mg BID continued             -antiplatelet therapy: none   3. Pain Management: Avoid Tylenol; oxycodone 5-10mg  q4h PRN, Robaxin PRN -10/08/22 Per pharmacy, was on gabapentin outpatient; defer to primary team whether this is needing to be restarted. Also encouraged pt to utilize PRN pain meds since he's endorsing pain but no meds have been given -10/09/22 pain improving, remembered once yesterday to ask for pain meds 4/29 utilized 1 dose of 5 mg oxycodone this morning, pain gradually improving, will restart low-dose gabapentin 100 mg 3 times daily 4-30: Pain well-controlled, monitor 5-1: Discontinue Oxy 10 mg as needed; maintain Oxy 5 mg as needed.   4. Mood/Behavior/Sleep: LCSW to evaluate and provide emotional support             -antipsychotic agents: n/a  -Trazodone PRN -10/09/22 apparently some nighttime confusion and day/night confusion, napping a lot during the day; advised avoidance of naps during the day to keep sleep schedule better; will get sleep log tonight; monitor 4-30 - 5/1: Sleep log shows sleeping 10+ hours per night.  Feeling well rested.  Likely secondary to substance withdrawal.   5. Neuropsych/cognition: This patient is not capable of making decisions on his own behalf.   6. Skin/Wound Care: Routine skin care checks   7. Fluids/Electrolytes/Nutrition: Routine Is and Os and follow-up chemistries             -continue thiamine and folic acid supplements, and MVI             -10/08/22 Vit B12 level WNL at 865   8: Alcoholism: on Librium taper, ended prior to CIR admission; encourage cessation -10/09/22 some overnight confusion; wonder if it's  from mild withdrawal vs daytime napping? Monitor for trend/pattern for now 4/29 restart low-dose gabapentin -improved, monitor   9: History of gastric ulcers, non-bleeding: 2021 -continue Protonix 40mg  QD while hospitalized, sucralfate not restarted at this time   10: Hypertension: Monitor TID and prn; has been on lisinopril 10 mg in the past ([facility]>>he did not follow-up) but not restarted             -has been normotensive this admission  -4/29-5/1 well-controlled continue to monitor Vitals:   10/07/22 1922 10/08/22 0502 10/08/22 2018 10/09/22 0521  BP: 106/76 109/74 118/80 106/75   10/09/22 1323 10/09/22 1909 10/10/22 0550 10/10/22 1254  BP: (!) 110/99 114/78 112/71 103/79   10/10/22 2001 10/11/22 0526 10/11/22 1421 10/12/22 0546  BP: 92/66 101/67 118/83 117/69      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 Conchita Paris   12: Right proximal humerus s/p ORIF 09/27/22              -NWB RUE, ok for AROM at elbow, wrist, and fingers             -keep in sling -ordering alternate sling due to it being soiled 4-30; received 5-1             -follow-up with Dr. Dion Saucier   13: Macrocytic anemia: Hgb stable at 10.8 on 10/08/22, monitor routine labs; B12 level WNL as above   14: Thrombocytopenia: improved on 10/08/22 labs, monitor on routine labs  15: Constipation: no  BM in several days, or longer (?1wk) -10/08/22 still no BM, will give Sorbitol 60ml x1 now, monitor for effect; continue miralax 17g BID, senokot S 1 tab BID, and PRNs -10/09/22 pt states BM this AM, not well documented; monitor, asked nursing to please try to document Bms 4/30 L BM     16: Hypokalemia  -4/29 will order 30 mEq potassium K-Dur -resolved 5-1, potassium 3.8.    LOS: 5 days A FACE TO FACE EVALUATION WAS PERFORMED  Richard Andrews 10/12/2022, 9:42 AM

## 2022-10-12 NOTE — Progress Notes (Signed)
Physical Therapy Session Note  Patient Details  Name: RANJIT ASHURST MRN: [id number] Date of Birth: 11/24/1960  Today's Date: 10/12/2022 PT Individual Time: 1445-1530 PT Individual Time Calculation (min): 45 min   Short Term Goals: Week 1:  PT Short Term Goal 1 (Week 1): Pt will ambulate >100 ft with min A or better PT Short Term Goal 2 (Week 1): Pt will remained seated OOB between sessions PT Short Term Goal 3 (Week 1): Pt will demo improved balance as quantified by ber score of 19 or better  Skilled Therapeutic Interventions/Progress Updates: Pt presents supine in bed and agreeable to therapy.  Pt transfers sup to sit w/ supervision.  Pt transfers sit to stand w/ CGA although does require cues for LUE use.  Pt amb x 100' to small gym w/o AD and CGA w/ multiple cues for direction.  Pt negotiated ramp surface and mulch pit w/ CGA.  Pt requires mod cues for safe approach to seat, tending to sit before completing turn and feeling for seat.  Pt negotiated cone obstacle course w/ tapping of alternate toes.  Pt requires cues for speed to improve weight shift to stance foot before attempting, perfoming L stance > R stance foot.  Pt picking up cones w/ CGA.  Pt stepping over canes on floor and stepping on 80% of time even w/ cueing for step length and approach.  Pt performed bean bag toss x 2 trials on [employer] cushion.  Pt unable to increase throwing strength to get in bucket, stating "My mind won't let me do it."  Pt reaching down to place bags in bucket w/ 1 LOB, to sitting back in chair.  Pt amb x 150' w/o AD and CGA before returning to room.  Supervision for bed mobility.  Bed alarm on and all needs in reach.     Therapy Documentation Precautions:  Precautions Precautions: Fall, Shoulder Shoulder Interventions: Shoulder sling/immobilizer, At all times, Off for dressing/bathing/exercises Precaution Comments: OK for AROM at elbow, wrist, and digits only Required Braces or Orthoses:  Sling Restrictions Weight Bearing Restrictions: Yes RUE Weight Bearing: Weight bearing as tolerated General:   Vital Signs: Therapy Vitals Temp: 98 F (36.7 C) Pulse Rate: 61 Resp: 17 BP: 98/66 Patient Position (if appropriate): Lying Oxygen Therapy SpO2: 97 % O2 Device: Room Air Pain:0/10 Pain Assessment Pain Score: 0-No pain    Therapy/Group: Individual Therapy  Lucio Edward 10/12/2022, 3:50 PM

## 2022-10-12 NOTE — Progress Notes (Signed)
Physical Therapy Session Note  Patient Details  Name: Richard Andrews MRN: [id number] Date of Birth: 12/04/1960  Today's Date: 10/12/2022 PT Individual Time: 0901-1014 PT Individual Time Calculation (min): 73 min   Short Term Goals: Week 1:  PT Short Term Goal 1 (Week 1): Pt will ambulate >100 ft with min A or better PT Short Term Goal 2 (Week 1): Pt will remained seated OOB between sessions PT Short Term Goal 3 (Week 1): Pt will demo improved balance as quantified by ber score of 19 or better  Skilled Therapeutic Interventions/Progress Updates:     Pt received semi reclined in bed and agrees to therapy. Reports slight pain in R shoulder but said he had pain meds prior to therapy. PT provides bracing and rest breaks to manage pain. Pt performs supine to sit with cues to not utilize R upper extremity as pt is not consistent with NWB precautions. Upon sitting EOB, PT adjusts sling for improved fit and pt performs ambulatory transfer to toilet with minA for trunk stability. Pt is continent of urine but aim is not optimal, as pt sprays urine over legs and feet in standing. PT suggests shower and pt is agreeable. Pt cued for use of grab bars with L upper extremity in standing and pt is able to remove pants without physical assistance. PT assists to doff shirt and arm sling and pt transfers onto [facility] in shower with cues for positioning. Pt showers with close supervision for safety and PT assists to dry off backside and lower extremities in standing. PT assists to don clean brief and pt ambulates to [facility] with cues for sequencing and reminder to not use R upper extremity. WC transport to gym. Pt stands with CGA and ambulates x250' with CGA/minA with cues to maintain upright gaze to improve posture and balance, increase gait speed to decrease risk for falls, and cues for spontaneous turning to the L to challenge dynamic balance and L inattention. Following rest break, pt stands and performs alternating toe taps on  cones to challenge dynamic balance, single leg stance, L inattention, and coordination. Pt completes 2x20 with minA for stability and to promote full lateral weight shift into L lower extremity. Seated rest breaks between bouts. WC transport back to room. Left seated with alarm intact and all needs within reach.   Therapy Documentation Precautions:  Precautions Precautions: Fall, Shoulder Shoulder Interventions: Shoulder sling/immobilizer, At all times, Off for dressing/bathing/exercises Precaution Comments: OK for AROM at elbow, wrist, and digits only Required Braces or Orthoses: Sling Restrictions Weight Bearing Restrictions: Yes RUE Weight Bearing: Weight bearing as tolerated (pt ambulated to b/r using rolling walker + 2 assist, later pt got out of bed byhimself, extra monitoring visual checks, b/alarm)  Therapy/Group: Individual Therapy  Beau Fanny, PT, DPT 10/12/2022, 10:40 AM

## 2022-10-12 NOTE — Progress Notes (Signed)
Inpatient Rehabilitation Care Coordinator Assessment and Plan Patient Details  Name: Richard Andrews MRN: [id number] Date of Birth: 1961/04/23  Today's Date: 10/12/2022  Hospital Problems: Principal Problem:   Subdural hematoma [facility])  Past Medical History:  Past Medical History:  Diagnosis Date   Allergy    Distal radius fracture, left    Hypertension    Past Surgical History:  Past Surgical History:  Procedure Laterality Date   BIOPSY  01/22/2021   Procedure: BIOPSY;  Surgeon: Shellia Cleverly, DO;  Location: [facility];  Service: Gastroenterology;;   ESOPHAGOGASTRODUODENOSCOPY (EGD) WITH PROPOFOL N/A 01/22/2021   Procedure: ESOPHAGOGASTRODUODENOSCOPY (EGD) WITH PROPOFOL;  Surgeon: Shellia Cleverly, DO;  Location: [facility];  Service: Gastroenterology;  Laterality: N/A;   I & D EXTREMITY Right 01/10/2020   Procedure: ORIF RIGHT WRIST;  Surgeon: Dominica Severin, MD;  Location: [facility] OR;  Service: Orthopedics;  Laterality: Right;   IR ANGIO EXTERNAL CAROTID SEL EXT CAROTID UNI R MOD SED  10/04/2022   IR ANGIO INTRA EXTRACRAN SEL INTERNAL CAROTID UNI R MOD SED  09/30/2022   IR ANGIOGRAM FOLLOW UP STUDY  09/30/2022   IR NEURO EACH ADD'L AFTER BASIC UNI RIGHT (MS)  10/04/2022   IR TRANSCATH/EMBOLIZ  09/30/2022   OPEN REDUCTION INTERNAL FIXATION (ORIF) DISTAL RADIAL FRACTURE Left 01/28/2015   Procedure: OPEN REDUCTION INTERNAL FIXATION (ORIF) LEFT DISTAL RADIAL FRACTURE;  Surgeon: Tarry Kos, MD;  Location: [facility];  Service: Orthopedics;  Laterality: Left;   ORIF HUMERUS FRACTURE Right 09/27/2022   Procedure: OPEN REDUCTION INTERNAL FIXATION (ORIF) PROXIMAL HUMERUS FRACTURE;  Surgeon: Teryl Lucy, MD;  Location: [facility] OR;  Service: Orthopedics;  Laterality: Right;   RADIOLOGY WITH ANESTHESIA N/A 09/30/2022   Procedure: IR WITH ANESTHESIA MMA EMBOLIZATION;  Surgeon: Radiologist, Medication, MD;  Location: [facility] OR;  Service: Radiology;  Laterality: N/A;   TONSILLECTOMY      Social History:  reports that he has been smoking cigarettes. He has a 10.00 pack-year smoking history. His smokeless tobacco use includes chew. He reports current alcohol use of about 16.0 - 20.0 standard drinks of alcohol per week. He reports that he does not use drugs.  Family / Support Systems Marital Status: Married How Long?: since 1996. Currently verbally seperated from wife due to etoh abuse. 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 Pt wife intends to provide all care pt requires at d/c as BIL is disabled and only able to provide supervision. Caregiver Availability: 24/7 Family Dynamics: Pt currenlty lives with his BIL at this time.  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 Pt currently receives short term disability. Legal History/Current Legal Issues: Denies Guardian/Conservator: N/A   Abuse/Neglect Abuse/Neglect Assessment Can Be Completed: Unable to assess, patient is non-responsive or altered mental status Physical Abuse: Denies Verbal Abuse: Denies Sexual Abuse: Denies Exploitation of patient/patient's resources: Denies Self-Neglect: Denies  Patient response to: Social Isolation - How often do you feel lonely or isolated from those around you?: Patient unable to respond  Emotional Status Pt's affect, behavior and adjustment status: Pt in good spirits at time of visit. Slightly confused  durign assessment and unable to answer SW questions correctly. Recent Psychosocial Issues: Denies Psychiatric History: Pt admits to beign anxious but denies any medicaiton Substance Abuse History: Admits to smoking 1pk cigarettes daily. 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 Denies rec drug use. States his longest period of sobriety was 48 days when he went to rehab.  Patient / Family Perceptions, Expectations & Goals Pt/Family understanding of illness & functional limitations: Pt wife has a general understanding of pt care needs Premorbid pt/family roles/activities: Independent Anticipated changes in roles/activities/participation: Assistance with ADLs/IADLs Pt/family expectations/goals: pt goal is to wrk on walking  [employer]: None Premorbid Home Care/DME Agencies: None Transportation available at discharge: TBD Is the patient able to respond to transportation needs?: Yes In the past 12 months, has lack of transportation kept you from medical appointments or from getting medications?: No In the past 12 months, has lack of transportation kept you from meetings, work, or from getting things needed for daily living?: No Resource referrals recommended: Neuropsychology  Discharge Planning Living Arrangements: Other relatives Support Systems: Spouse/significant other, Children, Other relatives Type of Residence: Private residence Insurance Resources: Media planner (specify) Herbalist) Financial Resources: Employment Surveyor, quantity Screen Referred: No Living Expenses: Lives with family Money Management: Family Does the patient have any problems obtaining your medications?: No Home Management: Ot manages all home care needs Patient/Family Preliminary Plans: TBD Care Coordinator Barriers to Discharge: Decreased caregiver support, Lack of/limited family support, Insurance for SNF coverage Care Coordinator Anticipated Follow Up Needs: HH/OP Expected length  of stay: 14 days  Clinical Impression  12pm- SW spoke with pt wife Richard Andrews to get insight on housign situation, Reports she will continue to provide support pt requires. SW provided updates on [facility].     *SW met with pt in room to introduce self, explain role, and discuss discharge process. Pt is not a Cytogeneticist. No HCPOA. Access to RW (was his mother;s).   Sona Nations A Cherie Lasalle 10/12/2022, 1:09 PM

## 2022-10-12 NOTE — Progress Notes (Signed)
Occupational Therapy TBI Note  Patient Details  Name: Richard Andrews MRN: [id number] Date of Birth: 09-30-1960  Today's Date: 10/12/2022 OT Individual Time: 1345-1426 OT Individual Time Calculation (min): 41 min    Short Term Goals: Week 1:  OT Short Term Goal 1 (Week 1): Pt will complete 2/4 steps of UB dressing wiht MOD A OT Short Term Goal 2 (Week 1): Pt will don sling wiht MOD A OT Short Term Goal 3 (Week 1): Pt will thread BLE into pants wiht AE PRN OT Short Term Goal 4 (Week 1): Pt will stand at sink wiht no more than MIN A to demo improved balance during grooming tasks  Skilled Therapeutic Interventions/Progress Updates:     Pt received in bed with no pain reported. Agreeable to tx  ADL: Pt dons shoes with cuing to not reach with RUE down to floor. Pt begins to walk to dayroom, however keeps tripping over L foot catching on floor with good reactive stepping. Pt impulsively takes off shoes and picks up from floor to walk barefoot. Redirected pt to tx gym and provides grippy socks for instruction for 1 handed tying. Pt completes with mod cuing.    Therapeutic activity Pt wit no more trips while walking to dayroom. Pegs and board set up ~25 feet apart for pt to walk back and forth to complete picture from visual memory. Pt completes activity needing mod cuing for noticing error in graded peg board tasks and 2 instances of needing to look at picture after ~7 min of moving 1 peg at a time at ambulatory level.  Pt left at end of session in bed with exit alarm on, call light in reach and all needs met   Therapy Documentation Precautions:  Precautions Precautions: Fall, Shoulder Shoulder Interventions: Shoulder sling/immobilizer, At all times, Off for dressing/bathing/exercises Precaution Comments: OK for AROM at elbow, wrist, and digits only Required Braces or Orthoses: Sling Restrictions Weight Bearing Restrictions: Yes RUE Weight Bearing: Weight bearing as tolerated General:    Vital Signs:  Pain: Pain Assessment Pain Score: 0-No pain Agitated Behavior Scale: TBI  Observation Details Observation Environment: [facility] Start of observation period - Date: 10/12/22 Start of observation period - Time: 1345 End of observation period - Date: 10/12/22 End of observation period - Time: 1436 Agitated Behavior Scale (DO NOT LEAVE BLANKS) Short attention span, easy distractibility, inability to concentrate: Present to a slight degree Impulsive, impatient, low tolerance for pain or frustration: Absent Uncooperative, resistant to care, demanding: Present to a slight degree Violent and/or threatening violence toward people or property: Absent Explosive and/or unpredictable anger: Absent Rocking, rubbing, moaning, or other self-stimulating behavior: Absent Pulling at tubes, restraints, etc.: Absent Wandering from treatment areas: Absent Restlessness, pacing, excessive movement: Absent Repetitive behaviors, motor, and/or verbal: Absent Rapid, loud, or excessive talking: Absent Sudden changes of mood: Absent Easily initiated or excessive crying and/or laughter: Absent Self-abusiveness, physical and/or verbal: Absent Agitated behavior scale total score: 16   Therapy/Group: Individual Therapy  Shon Hale 10/12/2022, 2:37 PM

## 2022-10-13 NOTE — Discharge Summary (Signed)
Physician Discharge Summary  Patient ID: Richard Andrews MRN: [id number] DOB/AGE: 08/27/1960 62 y.o.  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  Confusion Disrupted wake/sleep cycle Alcoholism Hypertension Right chronic SDH Right proximal humerus fracture Macrocytic anemia Thrombocytopenia Constipation  Discharged Condition: stable  Significant Diagnostic Studies: none    Labs:  Basic Metabolic Panel:    Latest Ref Rng & Units 10/17/2022    6:08 AM 10/12/2022    6:24 AM 10/10/2022    6:02 AM  BMP  Glucose 70 - 99 mg/dL 409  99  99   BUN 8 - 23 mg/dL 10  10  13    Creatinine 0.61 - 1.24 mg/dL 8.11  9.14  7.82   Sodium 135 - 145 mmol/L 136  136  134   Potassium 3.5 - 5.1 mmol/L 3.5  3.8  3.4   Chloride 98 - 111 mmol/L 102  103  102   CO2 22 - 32 mmol/L 25  25  24    Calcium 8.9 - 10.3 mg/dL 9.4  9.1  9.1      CBC:    Latest Ref Rng & Units 10/17/2022    6:08 AM 10/10/2022    6:02 AM 10/08/2022    5:49 AM  CBC  WBC 4.0 - 10.5 K/uL 3.2  3.9  3.8   Hemoglobin 13.0 - 17.0 g/dL 95.6  21.3  08.6   Hematocrit 39.0 - 52.0 % 31.3  29.5  31.8   Platelets 150 - 400 K/uL 152  209  210      CBG: No results for input(s): "GLUCAP" in the last 168 hours.   Brief HPI:   Richard Andrews is a 62 y.o. male  presented to [facility] ED on 09/24/2022 after being found down at home by family members. He could recall that he fell onto his right side. Endorses ongoing alcohol use of approximately 1-1/2 pints of whiskey daily. He and family also reported previous fall approximately one month ago and had a significant left forehead bruising and hematoma along with the superficial laceration but did not seek medical care.  CT head showed subacute-on-chronic right convexity subdural hemorrhage.  Shoulder films consistent with proximal humerus  fracture. Labs showed hypokalemia to 3.1, mildly elevated transaminases, mild anemia, thrombocytopenia.  He was transferred to [facility] and underwent ORIF of right humerus on 4/16 by Dr. Dion Saucier. Post op NWB RUE, on Librium for CIWA protocol which has been weaned off.    He underwent right MMA embolization by Dr. Conchita Paris on 4/19, has been neurologically stable and tolerating regular diet. Hypokalemia has resolved, thrombocytopenia has resolved and has had resolution of abnormal LFTs. He continues to have mild-moderate dysarthria c/b low volume, imprecise articulation, weakness with balance deficits which are affecting his ability to carry out ADLs and mobility. Therapy has been working with patient and CIR was recommended due to functional decline   Hospital Course: Richard Andrews was admitted to rehab 10/07/2022 for inpatient therapies to consist of PT, ST and OT at least three hours five days a week. Past admission physiatrist, therapy team and rehab RN have worked together to provide customized collaborative inpatient rehab. Some confusion and disrupted sleep cycle. Sleep chart ordered. Labs stable. B12 level normal. 4/29 utilized 1 dose of 5 mg oxycodone in the morning, pain gradually improving, restarted low-dose gabapentin 100 mg 3 times daily. Follow-up labs  on 4/29 with low potassium and given 30 mEq K-dur with repeat labs showing stability.   Sleep wake disruption has resolved and po intake has been good. He is continent of bowels and bowel regimen has been augmented with resolution of constipation. His pain has been well controlled with oxycodone 5 mg as needed and this was discontinued at discharge.  Follow-up labs stable. Platelets now normal and macrocytic anemia stable. Neuropsychology consult performed on 5/03. Good bladder control with no signs of retention of PVR checks so this was discontinued. Blood pressures were monitored on TID basis and remained stable. He has been educated on ETOH cessation  and gabapentin was discontinued prior to discharge. He has made gains during his stay and supervision is recommended for safety. He will continue to receive follow up outpatient PT, OT and ST at [facility] after discharge.    Rehab course: During patient's stay in rehab weekly team conferences were held to monitor patient's progress, set goals and discuss barriers to discharge. At admission, patient required max with basic self-care skills and mod with mobility. He exhibited mild to moderate dysarthria with low volume and moderate to severe cognitive impairment with SLUMS score of 9/30/  He has had improvement in activity tolerance, balance, postural control as well as ability to compensate for deficits. He is able to complete ADL tasks with supervision. He requires supervision for transfers and is able to ambulate 200' without AD and cues for foot clearance. Family education has been completed.    Disposition: Home  Diet: Heart healthy  Special Instructions:  NO Driving or alcohol. . 2. Avoid Tylenol.  3. NWB through left arm and maintain in sling at all times. 4. Recommend follow up CBC to monitor WBC/Hgb in 1-2 weeks.  5. Family to manage/assist with medications   Discharge Instructions     Ambulatory referral to Occupational Therapy   Complete by: As directed    Eval and treat   Ambulatory referral to Physical Medicine Rehab   Complete by: As directed    Moderate complexity follow-up 1 to 2 weeks traumatic SDH   Ambulatory referral to Physical Therapy   Complete by: As directed    Eval and treat   Ambulatory referral to Speech Therapy   Complete by: As directed    Eval and treat      Allergies as of 10/21/2022   No Known Allergies      Medication List     STOP taking these medications    calcitonin (salmon) 200 UNIT/ACT nasal spray Commonly known as: Miacalcin   [employer] VITAMIN B12 1000 MCG tablet Generic drug: cyanocobalamin   cyclobenzaprine 10  MG tablet Commonly known as: FLEXERIL   gabapentin 400 MG capsule Commonly known as: NEURONTIN   lisinopril 20 MG tablet Commonly known as: ZESTRIL   sucralfate 1 g tablet Commonly known as: Carafate   thiamine 100 MG tablet Commonly known as: VITAMIN B1 Replaced by: thiamine 100 MG tablet   triamcinolone cream 0.5 % Commonly known as: KENALOG       TAKE these medications    folic acid 1 MG tablet Commonly known as: FOLVITE Take 1 tablet (1 mg total) by mouth daily.   multivitamin with minerals Tabs tablet Take 1 tablet by mouth daily.   pantoprazole 40 MG tablet Commonly known as: PROTONIX Take 1 tablet (40 mg total) by mouth daily. What changed: when to take this   polyethylene glycol powder 17 GM/SCOOP powder Commonly known  asSharon Seller Take 17 g by mouth daily. Start taking on: Oct 22, 2022   Senexon-S 8.6-50 MG tablet Generic drug: senna-docusate Take 1 tablet by mouth 2 (two) times daily.   thiamine 100 MG tablet Commonly known as: VITAMIN B1 Take 1 tablet (100 mg total) by mouth daily. Replaces: thiamine 100 MG tablet        Follow-up Information     Angelina Sheriff, DO Follow up.   Specialty: Physical Medicine and Rehabilitation Why: office will call you to arrange your appt (sent) Contact information: [address] [name] [address] [phone]         [name], NP. Call on 10/24/2022.   Specialty: Nurse Practitioner Why: to make arrangements for hospital follow-up appointment. Recheck CBC. Contact information: [address] [name] [address] [phone]         Teryl Lucy, MD. Call on 10/24/2022.   Specialty: Orthopedic Surgery Why: for hospital follow-up appointment. Contact information: [address] [name] [address] [phone]         Lisbeth Renshaw, MD. Call on 10/24/2022.   Specialty: Neurosurgery Why: for hospital follow-up  appointment. Contact information: [address] [name] [address] [phone]                 Signed: Jacquelynn Cree 10/21/2022, 7:08 PM

## 2022-10-13 NOTE — Progress Notes (Signed)
Occupational Therapy Session Note  Patient Details  Name: Richard Andrews MRN: [id number] Date of Birth: 1961/01/23  Session 1 Today's Date: 10/13/2022 OT Individual Time: 4098-1191 OT Individual Time Calculation (min): 70 min   Session 2 Today's Date: 10/13/2022 OT Individual Time: 1100-1140 OT Individual Time Calculation (min): 40 min  and Today's Date: 10/13/2022 OT Missed Time: 20 Minutes Missed Time Reason: Patient fatigue   Short Term Goals: Week 1:  OT Short Term Goal 1 (Week 1): Pt will complete 2/4 steps of UB dressing wiht MOD A OT Short Term Goal 2 (Week 1): Pt will don sling wiht MOD A OT Short Term Goal 3 (Week 1): Pt will thread BLE into pants wiht AE PRN OT Short Term Goal 4 (Week 1): Pt will stand at sink wiht no more than MIN A to demo improved balance during grooming tasks  Skilled Therapeutic Interventions/Progress Updates:  Session 1   Pt greeted semi-reclined in bed and agreeable to OT treatment session. Pt reported he would like to shower this morning in preparation for his daughters graduation. Pt doffed shirt at EOB with min A and OT covered R shoulder with waterproof dressing. Pt needed cues not to push through R UE when going to stand without the sling on. Ambulation without device and CGA. Bathing completed from shower seat with CGA overall for bathing. OT educated on one handed dressing strategies. Pt demonstrated understanding, but still needed min A and increased time overall. Pt completed grooming tasks at the sink with CGA for balance. Functional ambulation to therapy gym w/o device and CGA. Addressed memory with BITS activity. Correlating words and images in increasing order, then having to recall the words in order. Pt did not recall doing this activity the other day, but with repetition, he was able to get up to 5 words in a row. Pt ambulated back to room in similar fashion and was left sitting up in recliner with alarm belt on and nursing present.   Session  2 Pt greeted seated in recliner and agreeable to OT treatment session with encouragement. Pt ambulated to elevators with CGA. We went down to the [facility] and worked on ambulating on uneven surfaces, up and down stairs, on crosswalks, and managing curbs. Pt with overall poor safety and body awareness, often running into things on the left side. OT educated extensively on safety within community environment. PT needed min A to go up and down longer stairs as he had difficulty taking large enough step to get his heel off of the step. Min A to stay on the sidewalk as he would veer off to the L and not be aware. Pt took seated rest break, then we worked on pathfinding back to the room. Pt with difficulty recalling how we got outside and needed moderate cues to find his way back upstairs to room. Pt wanted to rest before leaving for his daughters graduation and missed 20 minutes of therapy. Pt left seated in recliner with alarm belt on, call bell in reach, and needs met.    Therapy Documentation Precautions:  Precautions Precautions: Fall, Shoulder Shoulder Interventions: Shoulder sling/immobilizer, At all times, Off for dressing/bathing/exercises Precaution Comments: OK for AROM at elbow, wrist, and digits only Required Braces or Orthoses: Sling Restrictions Weight Bearing Restrictions: Yes RUE Weight Bearing: Weight bearing as tolerated General: General OT Amount of Missed Time: 20 Minutes  Pain: Pain Assessment Pain Scale: 0-10 Pain Score: 0-No pain    Therapy/Group: Individual Therapy  [name] Dianara Smullen  10/13/2022, 11:50 AM

## 2022-10-13 NOTE — Progress Notes (Signed)
Pts daughter here to transport pt to her graduation. Daughter and pt voice understanding of grounds pass allowing for 2-3 hours out and then returning back to hospital.

## 2022-10-13 NOTE — Discharge Instructions (Addendum)
Inpatient Rehab Discharge Instructions  Richard Andrews Discharge date and time: 10/22/2022  Activities/Precautions/ Functional Status: Activity: no lifting, driving, or strenuous exercise until cleared by MD.  --NO WEIGHT on right arm until cleared by Dr. Dion Saucier.  Diet: cardiac diet Wound Care: none needed   Functional status:  ___ No restrictions     ___ Walk up steps independently _X__ 24/7 supervision/assistance   ___ Walk up steps with assistance ___ Intermittent supervision/assistance  ___ Bathe/dress independently ___ Walk with walker     ___ Bathe/dress with assistance ___ Walk Independently    ___ Shower independently __X_ Walk with supervision     _X _ Shower with assistance __x_ No alcohol     ___ Return to work/school ________   Special Instructions: No driving, alcohol consumption or tobacco use. Family will need to handle/help patient manage medications.   COMMUNITY REFERRALS UPON DISCHARGE:    Outpatient: PT      OT     ST                  Agency:[facility] location          Phone:[phone]              Appointment Date/Time:*Please expect follow-up within 7-10 business days to schedule your appointment. If you have not received follow-up, be sure to contact the site directly.*  Medical Equipment/Items Ordered:tub transfer bench                                                 Agency/Supplier: [employer] [phone]    My questions have been answered and I understand these instructions. I will adhere to these goals and the provided educational materials after my discharge from the hospital.  Patient/Caregiver Signature _______________________________ Date __________  Clinician Signature _______________________________________ Date __________  Please bring this form and your medication list with you to all your follow-up doctor's appointments.

## 2022-10-13 NOTE — Progress Notes (Signed)
Pt returned to room 4MW13 at this time. Pt resting in bed, no complaints voiced.

## 2022-10-13 NOTE — Progress Notes (Signed)
Went to see patient, patient not available.

## 2022-10-13 NOTE — Progress Notes (Signed)
Physical Therapy TBI Note  Patient Details  Name: Richard Andrews MRN: [id number] Date of Birth: January 24, 1961  Today's Date: 10/13/2022 PT Individual Time: 4098-1191 PT Individual Time Calculation (min): 80 min   Short Term Goals: Week 1:  PT Short Term Goal 1 (Week 1): Pt will ambulate >100 ft with min A or better PT Short Term Goal 2 (Week 1): Pt will remained seated OOB between sessions PT Short Term Goal 3 (Week 1): Pt will demo improved balance as quantified by ber score of 19 or better  Skilled Therapeutic Interventions/Progress Updates:  Patient greeted sitting in bedside recliner in his room and agreeable to PT treatment session. Patient stood from bedside recliner without the use of an AD and CGA- Patient gait trained to ortho gym with CGA and minor VC for improved L foot clearance as patient tends to shuffle with his L foot.   Patient performed various activities to improve L foot clearance throughout swing phase of gait and improved R lateral weight shift/acceptance- -L foot taps to 8" step with 5# weight and no UE support with CGA/MinA for improved stability, performed 2 x 15  -L LAQ with 5#, x15 with 3 second hold  -Patient gait trained x150' and x120' with 5# weight donned to L ankle and multimodal cues for improved L foot clearance throughout swing phase of gait  After all the above activities, patient then gait trained ~220' without the use of an AD and CGA/SBA for safety and improved L foot clearance noted with patient stating "we need to do more of that, it worked." Patient continues to required [facility] for obstacle negotiation as he tends to get too close to items/doorways on his L.   Patient performed x10 sit/stands without UE support and SBA for safety- Minor VC for increased anterior weight shift with good improvements noted.   Patient weaved between x10 cones, x2 trials with CGA for facilitating improved motor planning and sequencing in order to avoid knocking cones over-  Patient completed first trial with knocking over 3 cones and second trial with only knocking over one cone secondary to poor awareness of foot placement.   Patient tasked with performing alternating toe taps to cones and side-stepping in between, completed x10 cones in each direction with CGA/MinA for safety/stability- Patient knocked over x3 cones when side-stepping to his R vs no cones with stepping to his L.   Patient completed the Nustep x10 minutes on level 5 with B LE only in order to improve overall endurance/strength.   Patient gait trained back to his room without the use of an AD and CGA/SBA- Patient requested to use the restroom prior to returning to recliner. Patient stood in front of the urinal and voided with CGA for safety. Patient required assistance for buttoning his pants back. Patient left sitting upright in bedside recliner with posey belt on, call bell within reach and all needs met.    Therapy Documentation Precautions:  Precautions Precautions: Fall, Shoulder Shoulder Interventions: Shoulder sling/immobilizer, At all times, Off for dressing/bathing/exercises Precaution Comments: OK for AROM at elbow, wrist, and digits only Required Braces or Orthoses: Sling Restrictions Weight Bearing Restrictions: Yes RUE Weight Bearing: Weight bearing as tolerated  Pain: Patient reported 3/10 pain in R shoulder, which he says is almost constant however he did receive pain medication prior to treatment session.   Agitated Behavior Scale: TBI Observation Details Observation Environment: CIR Start of observation period - Date: 10/13/22 Start of observation period - Time: 0915 End of  observation period - Date: 10/13/22 End of observation period - Time: 1030 Agitated Behavior Scale (DO NOT LEAVE BLANKS) Short attention span, easy distractibility, inability to concentrate: Present to a slight degree Impulsive, impatient, low tolerance for pain or frustration: Absent Uncooperative,  resistant to care, demanding: Absent Violent and/or threatening violence toward people or property: Absent Explosive and/or unpredictable anger: Absent Rocking, rubbing, moaning, or other self-stimulating behavior: Absent Pulling at tubes, restraints, etc.: Absent Wandering from treatment areas: Absent Restlessness, pacing, excessive movement: Absent Repetitive behaviors, motor, and/or verbal: Absent Rapid, loud, or excessive talking: Absent Sudden changes of mood: Absent Easily initiated or excessive crying and/or laughter: Absent Self-abusiveness, physical and/or verbal: Absent Agitated behavior scale total score: 15    Therapy/Group: Individual Therapy  Richard Andrews 10/13/2022, 11:34 AM

## 2022-10-13 NOTE — Progress Notes (Signed)
PROGRESS NOTE   Subjective/Complaints:  No acute complaints.  No events overnight.  Patient feeling well today, states he is getting adequate rest but does tend to get his a.m. and p.m. mixed up first thing in the morning.  He endorses that he usually wakes up around 3 to 4 AM at home.  Confusion does not endure past the first hour of waking.  Feels prepared for outing today.  No questions.  ROS:  +Constipation-improved.  +Insomnia-early a.m. confusion-ongoing + Pain -improved Denies fevers, chills, CP, SOB, cough, abdominal pain, N/V/D, new/worsening paresthesias/weakness, or any other complaints at this time.     Objective:   No results found. No results for input(s): "WBC", "HGB", "HCT", "PLT" in the last 72 hours.  Recent Labs    10/12/22 0624  NA 136  K 3.8  CL 103  CO2 25  GLUCOSE 99  BUN 10  CREATININE 0.95  CALCIUM 9.1     Intake/Output Summary (Last 24 hours) at 10/13/2022 1203 Last data filed at 10/13/2022 0700 Gross per 24 hour  Intake 1214 ml  Output 1310 ml  Net -96 ml         Physical Exam: Vital Signs Blood pressure 109/76, pulse 64, temperature 97.7 F (36.5 C), resp. rate 17, height 5\' 11"  (1.803 m), weight 62.7 kg, SpO2 97 %.  Constitutional: No apparent distress. Appropriate appearance for age.  Seen ambulating in hallway with PT. HENT: No JVD. Neck Supple. Trachea midline. Atraumatic, normocephalic. Eyes: PERRLA. EOMI. Visual fields grossly intact.  No appreciable nystagmus at rest. Cardiovascular: RRR, no murmurs/rub/gallops. No Edema.  Right upper extremity capillary refill brisk. Respiratory: CTAB. No rales, rhonchi, or wheezing. On RA.  Abdomen: + bowel sounds, more normoactive. No distention or tenderness.  GU: Not examined. Skin: RUE surgical dressing intact, underlying Steri-Strips appear clean.  Mild bruising around right shoulder, wrist.-Improving MsK: R shoulder in sling.   Full active range of motion of right hand, wrist      Strength:                RUE: Grip, finger abduction 5 out of 5                LUE: 5/5 SA, 5/5 EF, 5/5 EE, 5/5 WE, 5/5 FF, 5/5 FA                 RLE: 5/5 HF, 5/5 KE, 5/5 DF, 5/5 EHL, 5/5 PF                 LLE:  5/5 HF, 5/5 KE, 5/5 DF, 5/5 EHL, 5/5 PF    Neurologic exam:  Cognition: AAO to person, place, and time.  + Lethargy, mild delay-improved today Can add change.  Cannot spell world backwards. Language: Fluent, in short phrases.   + Dysarthria and occasional  neoglisms. -improved + Memory deficit - improving   Insight: Poor insight into current condition.  Mood: Flat affect, appropriate mood.  Sensation: To light touch intact in BL UEs and LEs  Reflexes: 2+ in BL UE and LEs.   CN: + mild left facial droop; otherwise intact Coordination: + ataxia on FTN, HTS bilaterally.  Spasticity: MAS 0 in all extremities.  Assessment/Plan: 1. Functional deficits which require 3+ hours per day of interdisciplinary therapy in a comprehensive inpatient rehab setting. Physiatrist is providing close team supervision and 24 hour management of active medical problems listed below. Physiatrist and rehab team continue to assess barriers to discharge/monitor patient progress toward functional and medical goals  Care Tool:  Bathing    Body parts bathed by patient: Right arm, Chest, Abdomen, Front perineal area, Right upper leg, Left upper leg, Face   Body parts bathed by helper: Left arm, Buttocks, Right lower leg, Left lower leg     Bathing assist Assist Level: Maximal Assistance - Patient 24 - 49%     Upper Body Dressing/Undressing Upper body dressing   What is the patient wearing?: Pull over shirt, Orthosis    Upper body assist Assist Level: Total Assistance - Patient < 25%    Lower Body Dressing/Undressing Lower body dressing      What is the patient wearing?: Incontinence brief, Pants     Lower body assist Assist for lower  body dressing: Total Assistance - Patient < 25%     Toileting Toileting    Toileting assist Assist for toileting: Maximal Assistance - Patient 25 - 49%     Transfers Chair/bed transfer  Transfers assist     Chair/bed transfer assist level: Moderate Assistance - Patient 50 - 74%     Locomotion Ambulation   Ambulation assist      Assist level: Contact Guard/Touching assist Assistive device: No Device Max distance: 150   Walk 10 feet activity   Assist     Assist level: Contact Guard/Touching assist Assistive device: No Device   Walk 50 feet activity   Assist    Assist level: Contact Guard/Touching assist Assistive device: No Device    Walk 150 feet activity   Assist    Assist level: Contact Guard/Touching assist Assistive device: No Device    Walk 10 feet on uneven surface  activity   Assist     Assist level: Contact Guard/Touching assist Assistive device: Other (comment) (no AD)   Wheelchair     Assist Is the patient using a wheelchair?: Yes Type of Wheelchair: Manual    Wheelchair assist level: Supervision/Verbal cueing Max wheelchair distance: 150 ft    Wheelchair 50 feet with 2 turns activity    Assist        Assist Level: Supervision/Verbal cueing   Wheelchair 150 feet activity     Assist      Assist Level: Supervision/Verbal cueing   Blood pressure 109/76, pulse 64, temperature 97.7 F (36.5 C), resp. rate 17, height 5\' 11"  (1.803 m), weight 62.7 kg, SpO2 97 %.  Medical Problem List and Plan: 1. Functional deficits secondary to traumatic SDH status post MMA embolization             -patient may shower             -ELOS/Goals: 16-18 days, Min A PT/OT/SLP - DC date 5/11  -Continue CIR  -Plan is home with brother; on STD  4/30: ADLs limited by cognitive deficits to CGA; Min A for ambulation; may be upgfrading goals for stand mobility; SLP memory and awareness deficits limiting; low vocal intensity  baseline?  - grounds pass 5/3 a.m. for daughter's graduation; has completed family training and will be maintained at wheelchair level with min assist to CGA for transfers   2.  Antithrombotics: -DVT/anticoagulation:  Pharmaceutical: Lovenox 30 mg BID continued             -  antiplatelet therapy: none   3. Pain Management: Avoid Tylenol; oxycodone 5-10mg  q4h PRN, Robaxin PRN -10/08/22 Per pharmacy, was on gabapentin outpatient; defer to primary team whether this is needing to be restarted. Also encouraged pt to utilize PRN pain meds since he's endorsing pain but no meds have been given -10/09/22 pain improving, remembered once yesterday to ask for pain meds 4/29 utilized 1 dose of 5 mg oxycodone this morning, pain gradually improving, will restart low-dose gabapentin 100 mg 3 times daily 4-30: Pain well-controlled, monitor 5-1: Discontinue Oxy 10 mg as needed; maintain Oxy 5 mg as needed.-Well-controlled   4. Mood/Behavior/Sleep: LCSW to evaluate and provide emotional support             -antipsychotic agents: n/a  -Trazodone PRN -10/09/22 apparently some nighttime confusion and day/night confusion, napping a lot during the day; advised avoidance of naps during the day to keep sleep schedule better; will get sleep log tonight; monitor 4-30 - 5/1: Sleep log shows sleeping 10+ hours per night.  Feeling well rested.  Likely secondary to substance withdrawal.-Sleep improving   5. Neuropsych/cognition: This patient is not capable of making decisions on his own behalf.   6. Skin/Wound Care: Routine skin care checks   7. Fluids/Electrolytes/Nutrition: Routine Is and Os and follow-up chemistries             -continue thiamine and folic acid supplements, and MVI             -10/08/22 Vit B12 level WNL at 865   8: Alcoholism: on Librium taper, ended prior to CIR admission; encourage cessation -10/09/22 some overnight confusion; wonder if it's from mild withdrawal vs daytime napping? Monitor for  trend/pattern for now 4/29 restart low-dose gabapentin -improved, monitor   9: History of gastric ulcers, non-bleeding: 2021 -continue Protonix 40mg  QD while hospitalized, sucralfate not restarted at this time   10: Hypertension: Monitor TID and prn; has been on lisinopril 10 mg in the past ([facility]>>he did not follow-up) but not restarted             -has been normotensive this admission  -4/29-5/1 well-controlled continue to monitor Vitals:   10/09/22 1323 10/09/22 1909 10/10/22 0550 10/10/22 1254  BP: (!) 110/99 114/78 112/71 103/79   10/10/22 2001 10/11/22 0526 10/11/22 1421 10/12/22 0546  BP: 92/66 101/67 118/83 117/69   10/12/22 1453 10/12/22 1918 10/13/22 0450 10/13/22 1144  BP: 98/66 (!) 111/59 107/70 109/76      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 Conchita Paris   12: Right proximal humerus s/p ORIF 09/27/22              -NWB RUE, ok for AROM at elbow, wrist, and fingers             -keep in sling -ordering alternate sling due to it being soiled 4-30; received 5-1             -follow-up with Dr. Dion Saucier   13: Macrocytic anemia: Hgb stable at 10.8 on 10/08/22, monitor routine labs; B12 level WNL as above   14: Thrombocytopenia: improved on 10/08/22 labs, monitor on routine labs  15: Constipation: no BM in several days, or longer (?1wk) -10/08/22 still no BM, will give Sorbitol 60ml x1 now, monitor for effect; continue miralax 17g BID, senokot S 1 tab BID, and PRNs -10/09/22 pt states BM this AM, not well documented;  monitor, asked nursing to please try to document Bms 5/2 L BM     16: Hypokalemia  -4/29 will order 30 mEq potassium K-Dur -resolved 5-1, potassium 3.8.    LOS: 6 days A FACE TO FACE EVALUATION WAS PERFORMED  Angelina Sheriff 10/13/2022, 12:04 PM

## 2022-10-13 NOTE — Progress Notes (Signed)
Patient is cooperative but has some noted confusion, reoriented to time of day and location. Continue monitoring of- Sleep chart, slept total 7 hrs this shift.. Continue to monitor and assist prn,

## 2022-10-14 DIAGNOSIS — F1010 Alcohol abuse, uncomplicated: Secondary | ICD-10-CM

## 2022-10-14 DIAGNOSIS — S42201A Unspecified fracture of upper end of right humerus, initial encounter for closed fracture: Secondary | ICD-10-CM

## 2022-10-14 MED ORDER — OXYCODONE HCL 5 MG PO TABS
5.0000 mg | ORAL_TABLET | Freq: Three times a day (TID) | ORAL | Status: DC | PRN
  Administered 2022-10-14 – 2022-10-17 (×6): 5 mg via ORAL
  Filled 2022-10-14 (×6): qty 1

## 2022-10-14 NOTE — Progress Notes (Signed)
Speech Language Pathology Weekly Progress and Session Note  Patient Details  Name: Richard Andrews MRN: [id number] Date of Birth: [birth year]  Beginning of progress report period: October 08, 2022 End of progress report period: Oct 14, 2022  Today's Date: 10/14/2022 SLP Individual Time: 0232-0328 SLP Individual Time Calculation (min): 56 min  Short Term Goals: Week 1: SLP Short Term Goal 1 (Week 1): Pt will increase orientation to time, place and recent medical events with min A verbal and visual cues SLP Short Term Goal 1 - Progress (Week 1): Met SLP Short Term Goal 2 (Week 1): Pt will demonstrate sustained attention for 4-6 minutes during functional tasks provided mod A cues SLP Short Term Goal 2 - Progress (Week 1): Met SLP Short Term Goal 3 (Week 1): Pt will utilize speech intelligibility strategies as trained with mod A cues for 75% intellibility at simple sentence level SLP Short Term Goal 3 - Progress (Week 1): Met SLP Short Term Goal 4 (Week 1): Pt will follow 2-3 step directions during functional tasks with min A cues SLP Short Term Goal 4 - Progress (Week 1): Met SLP Short Term Goal 5 (Week 1): Pt will ID 2 physical and 2 cognitive changes s/p fall/hospitalization with mod A cues SLP Short Term Goal 5 - Progress (Week 1): Not met SLP Short Term Goal 6 (Week 1): Pt will recall functional, novel information (weight bearing precautions, etc) with 50% accuracy provided mod A verbal cues SLP Short Term Goal 6 - Progress (Week 1): Not met    New Short Term Goals: Week 2: SLP Short Term Goal 1 (Week 2): Pt will recall functional, novel information (weight bearing precautions, etc) with 50% accuracy provided mod A verbal cues SLP Short Term Goal 2 (Week 2): Pt will ID 2 physical and 2 cognitive changes s/p fall/hospitalization with mod A cues SLP Short Term Goal 3 (Week 2): Patient will utilize speech intelligibility strategies at the conversation level with mod multimodal cues to achieve 90%  intelligibility. SLP Short Term Goal 4 (Week 2): Patient will demonstrate sustained attention to functional tasks for 20 mintues with Mod verbal cues for redirection. SLP Short Term Goal 5 (Week 2): Patient will increase error awareness and repair during structured and functional tasks utilizing strategies as trained with min verbal and visual A  Weekly Progress Updates: Patient has made excellent gains and has met 4 of 6 STG's this reporting period due to improved speech intelligibility, attention, and orientation. Pt is currently an overall min A for cognitive tasks with continued deficits in working memory, attention, awareness, and speech intelligibility. Pt/ family education ongoing. Pt would benefit from continued skilled SLP intervention to maximize cognition and speech intelligibility in order to maximize his functional independence prior to discharge.   Intensity: Minumum of 1-2 x/day, 30 to 90 minutes Frequency: 1 to 3 out of 7 days Duration/Length of Stay: May 11 Treatment/Interventions: Cognitive remediation/compensation;Internal/external aids;Therapeutic Exercise;Therapeutic Activities;Patient/family education;Functional tasks;Cueing hierarchy   Daily Session  Skilled Therapeutic Interventions: Pt was seen in PM to address cognitive re- training. Pt was alert and seen at bedside, agreeable for ST session. He was oriented to temporal and spatial orientation concepts given Sup A. Pt challenged to recall 2 units of novel information across session with 100% acc indep across all trials. SLP further addressing moderate financial management task utilizing hypothetical phone bill. Pt identified requested information with 87% acc with min A. SLP transitioned session to completion of moderate level money management problems. Given a white  board pt was challenged to identify solutions which he completed with 75% acc improving to 87% acc with mod visual and verbal cues. In final minutes of session,  SLP addressed medication management through challenging pt to interpret prescription labels. Pt required mod A for 50% acc with decreased awareness of errors. In missed opportunities, SLP provided correct response and rationale. Pt left at bedside with call button within reach and bed alarm active. SLP to continue POC.     Pain Pain Assessment Pain Scale: 0-10 Pain Score: 3  Pain Location: Shoulder Pain Orientation: Right Pain Descriptors / Indicators: Aching  Therapy/Group: Individual Therapy  Renaee Munda 10/14/2022, 4:06 PM

## 2022-10-14 NOTE — Consult Note (Signed)
Neuropsychological Consultation Comprehensive Inpatient Rehab   Patient:   Richard Andrews   DOB:   1961-03-04  MR Number:  [id number]  Location:  [facility] [address] CENTER [address] [id number] [name] [address] Dept: [phone] Loc: [phone]           Date of Service:   10/14/2022  Start Time:   8 AM End Time:   9 AM  Provider/Observer:  Arley Phenix, Psy.D.       Clinical Neuropsychologist       Billing Code/Service: [phone]  Reason for Service:    Richard Andrews is a 62 year old male referred for neuropsychological consultation due to coping and adjustment issues and review of cognitive status.  Patient is currently admitted onto the comprehensive inpatient rehabilitation unit after suffering a fall and subdural hematoma with correlated alcohol use.  Patient presented to [facility] emergency department on 09/24/2022 after being found down at home by family members.  Patient was able to recount information about his fall onto his right side.  Patient admitted to ongoing alcohol use/abuse drinking an average of 1-1/2 pints of whiskey daily.  Patient had recent inpatient substance abuse/alcohol treatment almost 1 year prior and had been abstinence for 2 months before he restarted drinking.  Patient had had previous fall 1 month prior with significant left forehead bruising and hematoma along with superficial laceration.  No medical care was sought at that time.  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  Patient also suffered proximal humerus fracture as well.  Patient was transferred to [facility] and underwent R ORIF of right humerus on 09/27/2022.  Patient was started on oral Librium as standard protocol and weaning from alcohol.  Patient also underwent right MMA embolization by Dr. Conchita Paris on 4/19.  Patient has remained hemodynamically stable with  no apparent residual neurologic symptoms.  The patient was oriented x 4 with adequate mental status.  Patient was able to describe in general terms what it happened to him and what it happened medically.  He was unclear about specifics of his subdural hematoma and had numerous questions about how it would happen and what to expect going forward.  The patient reported that he felt like he has returned to baseline cognitively.  The patient is now 2 weeks without alcohol and reports that he has done fairly well.  Patient reports that he has long wanted to stop drinking and now it is having significant impacts on his relationship with his wife and is also likely directly related to his current injuries.  We spent some time addressing issues related to his substance abuse.  Another focus of today's visit was reinforcing the need for asking for assistance when he transfers as the patient has had times where he had self transferred without care team around.  HPI for the current admission:    HPI: Richard Andrews is a 62 year old male who presented to [facility] ED on 09/24/2022 after being found down at home by family members. He could recall that he fell onto his right side. Endorses ongoing alcohol use of approximately 1-1/2 pints of whiskey daily. He and family also reported previous fall approximately one month ago and had a significant left forehead bruising and hematoma along with the superficial laceration. He did not seek medical care at that time. CT head showed subacute-on-chronic right convexity subdural hemorrhage and CT cervical spine  negative for acute process. Shoulder films consistent with proximal humerus fracture. Labs showed hypokalemia to 3.1, mildly elevated transaminases, mild anemia, thrombocytopenia. Orthopedic surgery and neurosurgery consulted.  Transferred to [facility]. He underwent RORIF of right humerus on 4/16 by Dr. Dion Saucier. He is non-weight bearing and in a sling. Started on oral Librium for CIWA  protocol and weaned. He then underwent right MMA embolization by Dr. Conchita Paris on 4/19. He remained hemodynamically stable without neurologic symptoms. Started on Lovenox for DVT prophylaxis. Tolerating diet. SLP eval  4/26 for ongoing cognitive-linguistic therapy. HE exhibited good attention throughout today's session. Exhibited good eye contact.  He participated in therapy tasks.  He was able to converse with therapist and share some details about his day including plant to d/c to CIR this date. He had difficulty recalling specific details about CIR (e.g. estimated LOS, rehab services recommended). Continues with s/sx mild-moderate dysarthria c/b low volume and monotone. Minimally articulatory imprecision noted.    Follow-up labs with normalization of serum potassium and transaminases; pancytopenia (plt count 84k). The patient requires inpatient medicine and rehabilitation evaluations and services for ongoing dysfunction secondary to traumatic SDH and right humerus fracture.    Medical History:   Past Medical History:  Diagnosis Date   Allergy    Distal radius fracture, left    Hypertension          Patient Active Problem List   Diagnosis Date Noted   Subdural hematoma (HCC) 10/07/2022   Fall 09/24/2022   Alcohol withdrawal syndrome without complication (HCC)    Gastritis and gastroduodenitis    Multiple gastric ulcers    Duodenal ulcer    Acute blood loss anemia    Alcohol withdrawal seizure with complication (HCC)    GI bleed 01/19/2021   QT prolongation 01/19/2021   Electrolyte imbalance 02/06/2020   Macrocytic anemia 02/06/2020   Acute pain of right wrist 02/06/2020   Encounter for orthopedic follow-up care 01/28/2020   Open Colles' fracture of right radius 01/10/2020   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 his dress was Appropriate and he was Well Groomed and his manners were Appropriate to the situation.  his participation was indicative of Redirectable behaviors.  There were physical disabilities noted.  he displayed an appropriate level of cooperation and motivation.    Interactions:    Active Appropriate  Attention:   abnormal and attention span appeared shorter than expected for age  Memory:   within normal limits; recent and remote memory intact  Visuo-spatial:   not examined  Speech (Volume):  low  Speech:   normal; normal  Thought Process:  Coherent and Relevant  Directed and Organized  Though Content:  WNL; not suicidal and not homicidal  Orientation:   person, place, time/date, and situation  Judgment:   Fair  Planning:   Fair  Affect:    Appropriate  Mood:    Dysphoric  Insight:   Fair  Intelligence:   normal  Psychiatric History:  Patient has a long history of substance abuse with previous inpatient hospitalizations for alcohol abuse.  History of Substance Use or Abuse:  There is a documented history of alcohol abuse confirmed  by the patient.    Mental Health Hospitalizations:  Yes   Family Med/Psych History:  Family History  Problem Relation Age of Onset   Heart disease Mother    Hyperlipidemia Mother    Skin cancer Mother    Cancer Father    Multiple myeloma Sister    Throat cancer Sister     Impression/DX:   Richard Andrews is a 62 year old male referred for neuropsychological consultation due to coping and adjustment issues and review of cognitive status.  Patient is currently admitted onto the comprehensive inpatient rehabilitation unit after suffering a fall and subdural hematoma with correlated alcohol use.  Patient presented to [facility]  emergency department on 09/24/2022 after being found down at home by family members.  Patient was able to recount information about his fall onto his right side.  Patient admitted to ongoing alcohol use/abuse drinking an average of 1-1/2 pints of whiskey daily.  Patient had recent inpatient substance abuse/alcohol treatment almost 1 year prior and had been abstinence for 2 months before he restarted drinking.  Patient had had previous fall 1 month prior with significant left forehead bruising and hematoma along with superficial laceration.  No medical care was sought at that time.  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  Patient also suffered proximal humerus fracture as well.  Patient was transferred to [facility] and underwent R ORIF of right humerus on 09/27/2022.  Patient was started on oral Librium as standard protocol and weaning from alcohol.  Patient also underwent right MMA embolization by Dr. Conchita Paris on 4/19.  Patient has remained hemodynamically stable with no apparent residual neurologic symptoms.  The patient was oriented x 4 with adequate mental status.  Patient was able to describe in general terms what it happened to him and what it happened medically.  He was unclear about specifics of his subdural hematoma and had numerous questions about how it would happen and what to expect going forward.  The patient reported that he felt like he has returned to baseline cognitively.  The patient is now 2 weeks without alcohol and reports that he has done fairly well.  Patient reports that he has long wanted to stop drinking and now it is having significant impacts on his relationship with his wife and is also likely directly related to his current injuries.  We spent some time addressing issues related to his substance abuse.  Another focus of today's visit was reinforcing the need for asking for assistance when he transfers as the  patient has had times where he had self transferred without care team around.  Disposition/Plan:  Today we worked on coping and adjustment issues, reviewed cognitive status which displayed adequate learning to benefit from ongoing therapeutic interventions and addressed issues related to history of substance abuse and strategies going forward to address.  Diagnosis:    Alcohol dependence/abuse         Electronically Signed   _______________________ Arley Phenix, Psy.D. Clinical Neuropsychologist

## 2022-10-14 NOTE — Progress Notes (Signed)
Physical Therapy Weekly Progress Note  Patient Details  Name: Richard Andrews MRN: [id number] Date of Birth: [birth year]  Beginning of progress report period: October 08, 2022 End of progress report period: Oct 14, 2022  Today's Date: 10/14/2022 PT Individual Time: 0920-1013 PT Individual Time Calculation (min): 53 min  and Today's Date: 10/14/2022 PT Missed Time: 7 Minutes Missed Time Reason: Other (Comment) (Neuropsych appointment ran long)  Patient has met 3 of 3 short term goals. Mr. Hull has made good progress toward his STG and LTG since initial evaluation. Currently, he requires CGA/SBA for all functional mobility without the use of an AD.   Patient continues to demonstrate the following deficits muscle weakness, decreased cardiorespiratoy endurance, decreased midline orientation, decreased attention to left, and decreased motor planning, decreased attention, decreased awareness, decreased problem solving, decreased safety awareness, decreased memory, and delayed processing, and decreased standing balance, decreased balance strategies, and difficulty maintaining precautions and therefore will continue to benefit from skilled PT intervention to increase functional independence with mobility.  Patient progressing toward long term goals..  Continue plan of care.  PT Short Term Goals Week 1:  PT Short Term Goal 1 (Week 1): Pt will ambulate >100 ft with min A or better PT Short Term Goal 1 - Progress (Week 1): Met PT Short Term Goal 2 (Week 1): Pt will remained seated OOB between sessions PT Short Term Goal 2 - Progress (Week 1): Met PT Short Term Goal 3 (Week 1): Pt will demo improved balance as quantified by ber score of 19 or better PT Short Term Goal 3 - Progress (Week 1): Met Week 2:  PT Short Term Goal 1 (Week 2): STGs=LTGs secondary to ELOS  Skilled Therapeutic Interventions/Progress Updates:  Patient greeted standing in the bathroom by himself and agreeable to PT treatment session.  Patient educated on not standing/ambulating by himself- Patient reported understanding. Patient gait trained from his room to ortho gym without the use of an AD and SBA- VC for improved spatial awareness and not running into doorways and objects on his L.  Patient gait transfer to/from ortho gym and main gym with SBA/CGA while performing cognitive dual-tasking (naming 10 animals) while also given VC for increased cadence- As patient became increasingly challenged to think about different animals, his cadence decreased and demonstrated impaired L foot clearance.   Patient tasked with stepping over various height hurdles (9" and 11") without the use of an AD and CGA/MinA- -Stepped forward with L LE leading, x5 trials  -Stepped forward with R LE leading, x5 trials -Stepped laterally with R LE leading, x2 trials  -Stepped laterally with L LE leading, x2 trials.   Patient with increased difficulty with the above activity and knocked over multiple hurdles secondary poor foot clearance and awareness. Multimodal cues for decreased cadence and improved attention to foot placement with minimal improvements noted.   Patient gait trained ~8 minutes without the use of an AD and SBA/CGA for safety- As patient fatigued, he demonstrated shuffling gait pattern (L>R) requiring multimodal cues for improved B foot clearance and step length. Patient educated on the importance of self-correcting his gait pattern when he hears his feet scuffing the floor- Patient agreeable.   Patient returned to his room and left sitting in bedside recliner with posey belt on, call bell within reach and all needs met. Therapist noted can of dip on his tray table and handed it over to Consulting civil engineer, as well as provided education to the patient.  Therapy Documentation Precautions:  Precautions Precautions: Fall, Shoulder Shoulder Interventions: Shoulder sling/immobilizer, At all times, Off for dressing/bathing/exercises Precaution  Comments: OK for AROM at elbow, wrist, and digits only Required Braces or Orthoses: Sling Restrictions Weight Bearing Restrictions: Yes RUE Weight Bearing: Weight bearing as tolerated  Pain: Patient report 7/10 pain in R shoulder this morning, however already received pain medication. Patient and therapist discussed importance of not using R UE for functional mobility tasks and to keep it in the sling in order to improve overall pain level- Patient agreeable.   Therapy/Group: Individual Therapy  Vernica Wachtel 10/14/2022, 7:51 AM

## 2022-10-14 NOTE — Progress Notes (Signed)
Occupational Therapy TBI Note  Patient Details  Name: Richard Andrews MRN: [id number] Date of Birth: [birth year]  Today's Date: 10/14/2022 OT Individual Time: 1050-1230 OT Individual Time Calculation (min): 100 min    Short Term Goals: Week 1:  OT Short Term Goal 1 (Week 1): Pt will complete 2/4 steps of UB dressing wiht MOD A OT Short Term Goal 2 (Week 1): Pt will don sling wiht MOD A OT Short Term Goal 3 (Week 1): Pt will thread BLE into pants wiht AE PRN OT Short Term Goal 4 (Week 1): Pt will stand at sink wiht no more than MIN A to demo improved balance during grooming tasks  Skilled Therapeutic Interventions/Progress Updates:    Pt greeted sitting in recliner and agreeable to OT treatment session. Pt declined to shower today, but wanted some coffee. OT provided pt coffee and creamer. He was able to open containers using R hand to stabilize while removing lid. Pt ambulated to therapy gym without Ad and CGA. OT educated on ways pt can remove sling by pulling overhead. OT then educated on L UE there-ex of ONLY elbow, wrist, and hand. OT re-iterated NO shoulder AROM. Pt completed 3 sets of 10 elbow flex/ext. Pt lacking ~ 20 degrees of elbow extension. Wrist flex/ext, forearm pronation/supination, and finger flex/ext. Standing balance/endurance standing on foam block, then tossing bean bags on corn hone board. Pt only able to toss bean bags ~ 2-3 feet. Pt stated "somethinges not right." OT educated on SDH location on R side, affecting strength on L side of body. L hand strength and coordination with medium red theraputty. Pt with difficulty understanding some of the directions requiring demonstration. Pt collected beads from putty with focus on finger strength and pincer grasp. L shoulder strengthening with 3 sets of 10 curl to press using 5 lb free weight. Rest breaks in between. Pt ambulated to ortho gym and completed 5 minus forward and 5 mins backwards on [employer] arm bike for L UE strengthening. Pt  ambulated back to room with CGA and was set-up for lunch. Pt able to manipulate feeding utensil and get food bolus to mouth wit mild coordination deficits. Pt left seated with alarm belt on, call bell  in reach, and needs met.    Therapy Documentation Precautions:  Precautions Precautions: Fall, Shoulder Shoulder Interventions: Shoulder sling/immobilizer, At all times, Off for dressing/bathing/exercises Precaution Comments: OK for AROM at elbow, wrist, and digits only Required Braces or Orthoses: Sling Restrictions Weight Bearing Restrictions: Yes RUE Weight Bearing: Weight bearing as tolerated Pain:  Denies pain Agitated Behavior Scale: TBI Observation Details Observation Environment: CIR Start of observation period - Date: 10/14/22 Start of observation period - Time: 1045 End of observation period - Date: 10/14/22 End of observation period - Time: 1200 Agitated Behavior Scale (DO NOT LEAVE BLANKS) Short attention span, easy distractibility, inability to concentrate: Present to a slight degree Impulsive, impatient, low tolerance for pain or frustration: Absent Uncooperative, resistant to care, demanding: Absent Violent and/or threatening violence toward people or property: Absent Explosive and/or unpredictable anger: Absent Rocking, rubbing, moaning, or other self-stimulating behavior: Absent Pulling at tubes, restraints, etc.: Absent Wandering from treatment areas: Absent Restlessness, pacing, excessive movement: Absent Repetitive behaviors, motor, and/or verbal: Absent Rapid, loud, or excessive talking: Absent Sudden changes of mood: Absent Easily initiated or excessive crying and/or laughter: Absent Self-abusiveness, physical and/or verbal: Absent Agitated behavior scale total score: 15    Therapy/Group: Individual Therapy  [name] 10/14/2022, 11:45 AM

## 2022-10-14 NOTE — Progress Notes (Addendum)
PROGRESS NOTE   Subjective/Complaints:  No acute complaints.  No events overnight.   Notable record of patient having occasional urinary incontinence at nighttime.  Inquired about this with patient today, he denies it.  Denies any urgency, difficulty sensing when to urinate, difficulty controlling urination, or dysuria.  Per OT, continues to be impulsive with right upper extremity, needs sling in place at all times reminder to limit its use.  ROS:  +Constipation-improved.  +Insomnia-early a.m. confusion-ongoing + Pain -resolved Denies fevers, chills, CP, SOB, cough, abdominal pain, N/V/D, new/worsening paresthesias/weakness, or any other complaints at this time.     Objective:   No results found. No results for input(s): "WBC", "HGB", "HCT", "PLT" in the last 72 hours.  Recent Labs    10/12/22 0624  NA 136  K 3.8  CL 103  CO2 25  GLUCOSE 99  BUN 10  CREATININE 0.95  CALCIUM 9.1     Intake/Output Summary (Last 24 hours) at 10/14/2022 1446 Last data filed at 10/14/2022 0742 Gross per 24 hour  Intake 911 ml  Output 450 ml  Net 461 ml         Physical Exam: Vital Signs Blood pressure 118/75, pulse 63, temperature 98.9 F (37.2 C), temperature source Oral, resp. rate 16, height 5\' 11"  (1.803 m), weight 62.7 kg, SpO2 100 %.  Constitutional: No apparent distress. Appropriate appearance for age.  Seen working with PT. HENT: No JVD. Neck Supple. Trachea midline. Atraumatic, normocephalic. Eyes: PERRLA. EOMI. Visual fields grossly intact.  No appreciable nystagmus at rest. Cardiovascular: RRR, no murmurs/rub/gallops. No Edema.  Right upper extremity capillary refill brisk. Respiratory: CTAB. No rales, rhonchi, or wheezing. On RA.  Abdomen: + bowel sounds, more normoactive. No distention or tenderness.  GU: Not examined. Skin: RUE surgical dressing intact, underlying Steri-Strips appear clean.  Mild bruising around  right shoulder, wrist.-Improving MsK: R shoulder in sling.  Full active range of motion of right hand, wrist      Strength:                RUE: Grip, finger abduction 5 out of 5                LUE: 5/5 SA, 5/5 EF, 5/5 EE, 5/5 WE, 5/5 FF, 5/5 FA                 RLE: 5/5 HF, 5/5 KE, 5/5 DF, 5/5 EHL, 5/5 PF                 LLE:  5/5 HF, 5/5 KE, 5/5 DF, 5/5 EHL, 5/5 PF    Neurologic exam:  Cognition: AAO to person, place, and time.  + Lethargy, mild delay-improved, able from prior assessment Can add change.  Cannot spell world backwards. Language: Fluent, in short phrases.   + Dysarthria and occasional  neoglisms. -Mostly resolved + Memory deficit - improving but persistent   Insight: Poor insight into current condition.  Mood: Flat affect, appropriate mood.  Sensation: To light touch intact in BL UEs and LEs  Reflexes: 2+ in BL UE and LEs.   CN: + mild left facial droop; otherwise intact Coordination: No notable ataxia  on ambulation Spasticity: MAS 0 in all extremities.  Assessment/Plan: 1. Functional deficits which require 3+ hours per day of interdisciplinary therapy in a comprehensive inpatient rehab setting. Physiatrist is providing close team supervision and 24 hour management of active medical problems listed below. Physiatrist and rehab team continue to assess barriers to discharge/monitor patient progress toward functional and medical goals  Care Tool:  Bathing    Body parts bathed by patient: Right arm, Chest, Abdomen, Front perineal area, Right upper leg, Left upper leg, Face   Body parts bathed by helper: Left arm, Buttocks, Right lower leg, Left lower leg     Bathing assist Assist Level: Maximal Assistance - Patient 24 - 49%     Upper Body Dressing/Undressing Upper body dressing   What is the patient wearing?: Pull over shirt, Orthosis    Upper body assist Assist Level: Total Assistance - Patient < 25%    Lower Body Dressing/Undressing Lower body dressing       What is the patient wearing?: Incontinence brief, Pants     Lower body assist Assist for lower body dressing: Total Assistance - Patient < 25%     Toileting Toileting    Toileting assist Assist for toileting: Maximal Assistance - Patient 25 - 49%     Transfers Chair/bed transfer  Transfers assist     Chair/bed transfer assist level: Moderate Assistance - Patient 50 - 74%     Locomotion Ambulation   Ambulation assist      Assist level: Contact Guard/Touching assist Assistive device: No Device Max distance: 150   Walk 10 feet activity   Assist     Assist level: Contact Guard/Touching assist Assistive device: No Device   Walk 50 feet activity   Assist    Assist level: Contact Guard/Touching assist Assistive device: No Device    Walk 150 feet activity   Assist    Assist level: Contact Guard/Touching assist Assistive device: No Device    Walk 10 feet on uneven surface  activity   Assist     Assist level: Contact Guard/Touching assist Assistive device: Other (comment) (no AD)   Wheelchair     Assist Is the patient using a wheelchair?: Yes Type of Wheelchair: Manual    Wheelchair assist level: Supervision/Verbal cueing Max wheelchair distance: 150 ft    Wheelchair 50 feet with 2 turns activity    Assist        Assist Level: Supervision/Verbal cueing   Wheelchair 150 feet activity     Assist      Assist Level: Supervision/Verbal cueing   Blood pressure 118/75, pulse 63, temperature 98.9 F (37.2 C), temperature source Oral, resp. rate 16, height 5\' 11"  (1.803 m), weight 62.7 kg, SpO2 100 %.  Medical Problem List and Plan: 1. 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  - grounds pass 5/3 a.m. for daughter's graduation; has completed family training and will be maintained at wheelchair level with min assist to CGA for transfers   2.  Antithrombotics: -DVT/anticoagulation:  Pharmaceutical: Lovenox  30 mg BID continued             -antiplatelet therapy: none   3. Pain Management: Avoid Tylenol; oxycodone 5-10mg  q4h PRN, Robaxin PRN -10/08/22 Per pharmacy, was on gabapentin outpatient; defer to primary team whether this is needing to be restarted. Also encouraged pt to utilize PRN pain meds since he's endorsing pain but no meds have been given -10/09/22 pain improving, remembered once yesterday to ask for pain meds 4/29 utilized 1 dose of 5 mg oxycodone this morning, pain gradually improving, will restart low-dose gabapentin 100 mg 3 times daily 4-30: Pain well-controlled, monitor 5-1: Discontinue Oxy 10 mg as needed; maintain Oxy 5 mg as needed.-Well-controlled 5-3: Decrease oxy 5 mg as needed to every 8 hours   4. Mood/Behavior/Sleep: LCSW to evaluate and provide emotional support             -antipsychotic agents: n/a  -Trazodone PRN -10/09/22 apparently some nighttime confusion and day/night confusion, napping a lot during the day; advised avoidance of naps during the day to keep sleep schedule better; will get sleep log tonight; monitor 4-30 - 5/1: Sleep log shows sleeping 10+ hours per night.  Feeling well rested.  Likely secondary to substance withdrawal.-Sleep improved   5. Neuropsych/cognition: This patient is not capable of making decisions on his own behalf.   6. Skin/Wound Care: Routine skin care checks   7. Fluids/Electrolytes/Nutrition: Routine Is and Os and follow-up chemistries             -continue thiamine and folic acid supplements, and MVI             -10/08/22 Vit B12 level WNL at 865   8: Alcoholism: on Librium  taper, ended prior to CIR admission; encourage cessation -10/09/22 some overnight confusion; wonder if it's from mild withdrawal vs daytime napping? Monitor for trend/pattern for now 4/29 restart low-dose gabapentin -improved, monitor   9: History of gastric ulcers, non-bleeding: 2021 -continue Protonix 40mg  QD while hospitalized, sucralfate not restarted at this time   10: Hypertension: Monitor TID and prn; has been on lisinopril 10 mg in the past ([facility]>>he did not follow-up) but not restarted             -has been normotensive this admission  -4/29-5/3 well-controlled continue to monitor Vitals:   10/10/22 0550 10/10/22 1254 10/10/22 2001 10/11/22 0526  BP: 112/71 103/79 92/66 101/67   10/11/22 1421 10/12/22 0546 10/12/22 1453 10/12/22 1918  BP: 118/83 117/69 98/66 (!) 111/59   10/13/22 0450 10/13/22 1144 10/13/22 2016 10/14/22 0531  BP: 107/70 109/76 118/78 118/75      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 Conchita Paris   12: Right proximal humerus s/p ORIF 09/27/22              -NWB RUE, ok for AROM at elbow, wrist, and fingers             -keep in sling -ordering alternate sling due to it being soiled 4-30; received 5-1             -follow-up with Dr. Dion Saucier  Per therapies, need to maintain in sling at all times to remind of nonweightbearing status; otherwise is impulsive   13: Macrocytic anemia: Hgb stable at 10.8 on 10/08/22, monitor routine labs; B12 level WNL as above   14: Thrombocytopenia: improved on 10/08/22 labs, monitor on routine  labs  15: Constipation: no BM in several days, or longer (?1wk) -10/08/22 still no BM, will give Sorbitol 60ml x1 now, monitor for effect; continue miralax 17g BID, senokot S 1 tab BID, and PRNs -10/09/22 pt states BM this AM, not well documented; monitor, asked nursing to please try to document Bms 5/2 L BM     16: Hypokalemia  -4/29 will order 30 mEq potassium K-Dur  -resolved 5-1, potassium 3.8.   17: Urinary incontinence.  Generally nocturia, patient denies.  Reinstate PVRs  LOS: 7 days A FACE TO FACE EVALUATION WAS PERFORMED  Angelina Sheriff 10/14/2022, 2:46 PM

## 2022-10-15 NOTE — Progress Notes (Signed)
Occupational Therapy Session Note  Patient Details  Name: Richard Andrews MRN: [id number] Date of Birth: 1960-11-10  Today's Date: 10/15/2022 OT Individual Time: 1305-1330 OT Individual Time Calculation (min): 25 min    Skilled Therapeutic Interventions/Progress Updates:     Pt received in bed with no pain   Therapeutic activity Pt agreeable to functional mobility, dual task and working memory. Pt walks entire session >1000 feet with no LOB and no rest breaks while completing dual task/working memory with letter, numbers, days/months (familiar sequences), and naming in alphabetical order/alteranating between categories. Pt does well with all except when tasked with walking backwards +math increased errors and stopping to think. Occasional errors espcially when alternating through familiar sequences with little awareness, but able to correct with increased time.   Pt left at end of session in bed with exit alarm on, call light in reach and all needs met   Therapy Documentation Precautions:  Precautions Precautions: Fall, Shoulder Shoulder Interventions: Shoulder sling/immobilizer, At all times, Off for dressing/bathing/exercises Precaution Comments: OK for AROM at elbow, wrist, and digits only Required Braces or Orthoses: Sling Restrictions Weight Bearing Restrictions: Yes RUE Weight Bearing: Weight bearing as tolerated ABS discontinued d/t ABS score less than 20 for the last three days or no behaviors present  Therapy/Group: Individual Therapy  Shon Hale 10/15/2022, 6:50 AM

## 2022-10-15 NOTE — Progress Notes (Signed)
Patient bladder scanned per order 12 PM/6 PM. Both bladder scans post void with 0 ml remaining. Patient continues to remove right sling. Educated patient about keeping his arm in the sling and no weightbearing to his right shoulder/arm. Patient has not slept throughout the shift, up watching golf.

## 2022-10-15 NOTE — Progress Notes (Signed)
PROGRESS NOTE   Subjective/Complaints:  Pt doing well this morning, slept well overnight (looks like he slept from ~2200-0500). Pain is well managed. LBM was 2 days ago, which he states is normal, doesn't feel constipated. Urinating fine. Denies any other complaints or concerns today.   ROS:  +Constipation-improved/at baseline  +Insomnia-early a.m. confusion-improving? + Pain -resolved Denies fevers, chills, CP, SOB, cough, abdominal pain, N/V/D, new/worsening paresthesias/weakness, or any other complaints at this time.     Objective:   No results found. No results for input(s): "WBC", "HGB", "HCT", "PLT" in the last 72 hours.  No results for input(s): "NA", "K", "CL", "CO2", "GLUCOSE", "BUN", "CREATININE", "CALCIUM" in the last 72 hours.   Intake/Output Summary (Last 24 hours) at 10/15/2022 1209 Last data filed at 10/15/2022 0716 Gross per 24 hour  Intake 540 ml  Output 525 ml  Net 15 ml        Physical Exam: Vital Signs Blood pressure 103/66, pulse (!) 56, temperature 98.6 F (37 C), temperature source Oral, resp. rate 18, height 5\' 11"  (1.803 m), weight 62.7 kg, SpO2 97 %.  Constitutional: No apparent distress. Appropriate appearance for age.  Resting in bed comfortably.  HENT: No JVD. Neck Supple. Trachea midline. Atraumatic, normocephalic. Eyes: PERRLA. EOMI. Visual fields grossly intact.  No appreciable nystagmus at rest. Cardiovascular: mildly bradycardic, reg rhythm, no murmurs/rub/gallops. No Edema.  Right upper extremity capillary refill brisk. Respiratory: CTAB. No rales, rhonchi, or wheezing. On RA.  Abdomen: + bowel sounds, normoactive. No distention or tenderness.  GU: Not examined.  PRIOR EXAMS: Skin: RUE surgical dressing intact, underlying Steri-Strips appear clean.  Mild bruising around right shoulder, wrist.-Improving MsK: R shoulder in sling.  Full active range of motion of right hand, wrist       Strength:                RUE: Grip, finger abduction 5 out of 5                LUE: 5/5 SA, 5/5 EF, 5/5 EE, 5/5 WE, 5/5 FF, 5/5 FA                 RLE: 5/5 HF, 5/5 KE, 5/5 DF, 5/5 EHL, 5/5 PF                 LLE:  5/5 HF, 5/5 KE, 5/5 DF, 5/5 EHL, 5/5 PF    Neurologic exam:  Cognition: AAO to person, place, and time.  + Lethargy, mild delay-improved, able from prior assessment Can add change.  Cannot spell world backwards. Language: Fluent, in short phrases.   + Dysarthria and occasional  neoglisms. -Mostly resolved + Memory deficit - improving but persistent   Insight: Poor insight into current condition.  Mood: Flat affect, appropriate mood.  Sensation: To light touch intact in BL UEs and LEs  Reflexes: 2+ in BL UE and LEs.   CN: + mild left facial droop; otherwise intact Coordination: No notable ataxia on ambulation Spasticity: MAS 0 in all extremities.  Assessment/Plan: 1. Functional deficits which require 3+ hours per day of interdisciplinary therapy in a comprehensive inpatient rehab setting. Physiatrist is providing close team supervision  and 24 hour management of active medical problems listed below. Physiatrist and rehab team continue to assess barriers to discharge/monitor patient progress toward functional and medical goals  Care Tool:  Bathing    Body parts bathed by patient: Right arm, Chest, Abdomen, Front perineal area, Right upper leg, Left upper leg, Face   Body parts bathed by helper: Left arm, Buttocks, Right lower leg, Left lower leg     Bathing assist Assist Level: Maximal Assistance - Patient 24 - 49%     Upper Body Dressing/Undressing Upper body dressing   What is the patient wearing?: Pull over shirt, Orthosis    Upper body assist Assist Level: Total Assistance - Patient < 25%    Lower Body Dressing/Undressing Lower body dressing      What is the patient wearing?: Incontinence brief, Pants     Lower body assist Assist for lower body  dressing: Total Assistance - Patient < 25%     Toileting Toileting    Toileting assist Assist for toileting: Maximal Assistance - Patient 25 - 49%     Transfers Chair/bed transfer  Transfers assist     Chair/bed transfer assist level: Moderate Assistance - Patient 50 - 74%     Locomotion Ambulation   Ambulation assist      Assist level: Contact Guard/Touching assist Assistive device: No Device Max distance: 150   Walk 10 feet activity   Assist     Assist level: Contact Guard/Touching assist Assistive device: No Device   Walk 50 feet activity   Assist    Assist level: Contact Guard/Touching assist Assistive device: No Device    Walk 150 feet activity   Assist    Assist level: Contact Guard/Touching assist Assistive device: No Device    Walk 10 feet on uneven surface  activity   Assist     Assist level: Contact Guard/Touching assist Assistive device: Other (comment) (no AD)   Wheelchair     Assist Is the patient using a wheelchair?: Yes Type of Wheelchair: Manual    Wheelchair assist level: Supervision/Verbal cueing Max wheelchair distance: 150 ft    Wheelchair 50 feet with 2 turns activity    Assist        Assist Level: Supervision/Verbal cueing   Wheelchair 150 feet activity     Assist      Assist Level: Supervision/Verbal cueing   Blood pressure 103/66, pulse (!) 56, temperature 98.6 F (37 C), temperature source Oral, resp. rate 18, height 5\' 11"  (1.803 m), weight 62.7 kg, SpO2 97 %.  Medical Problem List and Plan: 1. 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 - grounds pass 5/3 a.m. for daughter's graduation; has completed family training and will be maintained at wheelchair level with min assist to CGA for transfers   2.  Antithrombotics: -DVT/anticoagulation:  Pharmaceutical: Lovenox 30 mg BID continued             -antiplatelet therapy: none   3. Pain Management: Avoid Tylenol; oxycodone 5-10mg  q4h PRN, Robaxin PRN -10/08/22 Per pharmacy,  was on gabapentin outpatient; defer to primary team whether this is needing to be restarted. Also encouraged pt to utilize PRN pain meds since he's endorsing pain but no meds have been given -10/09/22 pain improving, remembered once yesterday to ask for pain meds -4/29 utilized 1 dose of 5 mg oxycodone this morning, pain gradually improving, will restart low-dose gabapentin 100 mg 3 times daily -4-30: Pain well-controlled, monitor -5-1: Discontinue Oxy 10 mg as needed; maintain Oxy 5 mg as needed.-Well-controlled -5-3: Decrease oxy 5 mg as needed to every 8 hours   4. Mood/Behavior/Sleep: LCSW to evaluate and provide emotional support             -antipsychotic agents: n/a  -Trazodone PRN -10/09/22 apparently some nighttime confusion and day/night confusion, napping a lot during the day; advised avoidance of naps during the day to keep sleep schedule better; will get sleep log tonight; monitor -4-30 - 5/1: Sleep log shows sleeping 10+ hours per night.  Feeling well rested.  Likely secondary to substance withdrawal.-Sleep improved -10/15/22 sleeping improved, slept ~7hrs last night; monitor   5. Neuropsych/cognition: This patient is not capable of making decisions on his own behalf.   6. Skin/Wound Care: Routine skin care checks   7. Fluids/Electrolytes/Nutrition: Routine Is and Os and follow-up chemistries             -continue thiamine and folic acid supplements, and MVI             -10/08/22 Vit B12 level WNL at 865   8: Alcoholism: on Librium taper, ended prior to CIR admission;  encourage cessation -10/09/22 some overnight confusion; wonder if it's from mild withdrawal vs daytime napping? Monitor for trend/pattern for now -4/29 restart low-dose gabapentin -improved, monitor   9: History of gastric ulcers, non-bleeding: 2021 -continue Protonix 40mg  QD while hospitalized, sucralfate not restarted at this time   10: Hypertension: Monitor TID and prn; has been on lisinopril 10 mg in the past ([facility]>>he did not follow-up) but not restarted             -has been normotensive this admission  -4/29-5/4 well-controlled continue to monitor Vitals:   10/11/22 0526 10/11/22 1421 10/12/22 0546 10/12/22 1453  BP: 101/67 118/83 117/69 98/66   10/12/22 1918 10/13/22 0450 10/13/22 1144 10/13/22 2016  BP: (!) 111/59 107/70 109/76 118/78   10/14/22 0531 10/14/22 1537 10/14/22 2028 10/15/22 0527  BP: 118/75 108/71 107/71 103/66      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 Conchita Paris   12: Right proximal humerus s/p ORIF 09/27/22              -NWB RUE, ok for AROM at elbow, wrist, and fingers -keep in sling -ordering alternate sling due to it being soiled 4-30; received 5-1             -follow-up with Dr. Dion Saucier -Per therapies, need to maintain in sling at all times to remind of nonweightbearing status; otherwise is impulsive   13: Macrocytic anemia: Hgb stable at 10.8 on 10/08/22, monitor routine labs; B12 level WNL as above   14: Thrombocytopenia: improved on 10/08/22 labs, monitor on routine labs  15: Constipation: no BM in several days, or longer (?1wk) -10/08/22 still no BM, will give Sorbitol 60ml x1 now, monitor for effect; continue miralax 17g BID, senokot S 1 tab BID, and PRNs -10/09/22 pt states BM  this AM, not well documented; monitor, asked nursing to please try to document Bms -10/15/22 LBM 2 days ago, normal for him, monitor for now but may need to change regimen if no BM in next day or so    16:  Hypokalemia  -4/29 will order 30 mEq potassium K-Dur -resolved 5-1, potassium 3.8.   17: Urinary incontinence.  Generally nocturia, patient denies.  Reinstate PVRs -10/15/22 no further incontinence last night and no PVRs recorded but bladder scans show 0, so hopefully this has resolved; monitor  LOS: 8 days A FACE TO FACE EVALUATION WAS PERFORMED  [address] 10/15/2022, 12:09 PM

## 2022-10-15 NOTE — Progress Notes (Signed)
Physical Therapy TBI Note  Patient Details  Name: Richard Andrews MRN: [id number] Date of Birth: February 03, 1961  Today's Date: 10/15/2022 PT Individual Time: 0801-0858 PT Individual Time Calculation (min): 57 min   Short Term Goals: Week 1:  PT Short Term Goal 1 (Week 1): Pt will ambulate >100 ft with min A or better PT Short Term Goal 1 - Progress (Week 1): Met PT Short Term Goal 2 (Week 1): Pt will remained seated OOB between sessions PT Short Term Goal 2 - Progress (Week 1): Met PT Short Term Goal 3 (Week 1): Pt will demo improved balance as quantified by ber score of 19 or better PT Short Term Goal 3 - Progress (Week 1): Met  Skilled Therapeutic Interventions/Progress Updates:  Pt was seen bedside in the am. Pt transferred to edge of bed with S. Pt performed multiple sit to stand and stand pivot transfers with c/g and verbal cues. Pt ambulated into bathroom with c/g and verbal cues. Performed toilet transfers with c/g. Pt ambulated 125 feet x 2 with c/g and verbal cues. Pt performed cone taps and alternating cone taps 3 sets x 10 reps each with c/g to min A without assistive device. Pt performed slalom course 3 x 60 feet with c/g and verbal cues. Pt returned to room at end of treatment. Pt transferred edge of bed to supine with S and verbal cues. Pt left sitting up in bed with all needs within reach and bed alarm on.   Therapy Documentation Precautions:  Precautions Precautions: Fall, Shoulder Shoulder Interventions: Shoulder sling/immobilizer, At all times, Off for dressing/bathing/exercises Precaution Comments: OK for AROM at elbow, wrist, and digits only Required Braces or Orthoses: Sling Restrictions Weight Bearing Restrictions: Yes RUE Weight Bearing: Weight bearing as tolerated General:   Vital Signs:  Pain: No c/o pain.   Agitated Behavior Scale: TBI Agitated behavior Scale total score 15     Therapy/Group: Individual Therapy  Rayford Halsted 10/15/2022, 12:13 PM

## 2022-10-15 NOTE — Progress Notes (Signed)
Patient without noted distress or discomfort during the shift, and restful. Continue sleep chart-results indicating approximately 7.5-8 hrs, noted. Continue medical regime

## 2022-10-16 NOTE — Progress Notes (Signed)
PROGRESS NOTE   Subjective/Complaints:  Pt doing well this morning, slept well overnight. Pain is well managed. LBM was yesterday. Urinating fine, bladder scans/PVRs have been 0. Denies any other complaints or concerns today.   ROS:  +Constipation-improved/at baseline  +Insomnia-early a.m. confusion-improving? + Pain -resolved Denies fevers, chills, CP, SOB, cough, abdominal pain, N/V/D, new/worsening paresthesias/weakness, or any other complaints at this time.     Objective:   No results found. No results for input(s): "WBC", "HGB", "HCT", "PLT" in the last 72 hours.  No results for input(s): "NA", "K", "CL", "CO2", "GLUCOSE", "BUN", "CREATININE", "CALCIUM" in the last 72 hours.   Intake/Output Summary (Last 24 hours) at 10/16/2022 1139 Last data filed at 10/16/2022 0828 Gross per 24 hour  Intake 720 ml  Output --  Net 720 ml        Physical Exam: Vital Signs Blood pressure 106/64, pulse 60, temperature 98.8 F (37.1 C), temperature source Oral, resp. rate 18, height 5\' 11"  (1.803 m), weight 62.7 kg, SpO2 97 %.  Constitutional: No apparent distress. Appropriate appearance for age.  Resting in bed comfortably, sling in place.  HENT: No JVD. Neck Supple. Trachea midline. Atraumatic, normocephalic. Eyes: PERRLA. EOMI. Visual fields grossly intact.  No appreciable nystagmus at rest. Cardiovascular: mildly bradycardic, reg rhythm, no murmurs/rub/gallops. No Edema.  Right upper extremity capillary refill brisk. Respiratory: CTAB. No rales, rhonchi, or wheezing. On RA.  Abdomen: + bowel sounds, normoactive. No distention or tenderness.  GU: Not examined.  PRIOR EXAMS: Skin: RUE surgical dressing intact, underlying Steri-Strips appear clean.  Mild bruising around right shoulder, wrist.-Improving MsK: R shoulder in sling.  Full active range of motion of right hand, wrist      Strength:                RUE: Grip, finger  abduction 5 out of 5                LUE: 5/5 SA, 5/5 EF, 5/5 EE, 5/5 WE, 5/5 FF, 5/5 FA                 RLE: 5/5 HF, 5/5 KE, 5/5 DF, 5/5 EHL, 5/5 PF                 LLE:  5/5 HF, 5/5 KE, 5/5 DF, 5/5 EHL, 5/5 PF    Neurologic exam:  Cognition: AAO to person, place, and time.  + Lethargy, mild delay-improved, able from prior assessment Can add change.  Cannot spell world backwards. Language: Fluent, in short phrases.   + Dysarthria and occasional  neoglisms. -Mostly resolved + Memory deficit - improving but persistent   Insight: Poor insight into current condition.  Mood: Flat affect, appropriate mood.  Sensation: To light touch intact in BL UEs and LEs  Reflexes: 2+ in BL UE and LEs.   CN: + mild left facial droop; otherwise intact Coordination: No notable ataxia on ambulation Spasticity: MAS 0 in all extremities.  Assessment/Plan: 1. Functional deficits which require 3+ hours per day of interdisciplinary therapy in a comprehensive inpatient rehab setting. Physiatrist is providing close team supervision and 24 hour management of active medical problems listed below.  Physiatrist and rehab team continue to assess barriers to discharge/monitor patient progress toward functional and medical goals  Care Tool:  Bathing    Body parts bathed by patient: Right arm, Chest, Abdomen, Front perineal area, Right upper leg, Left upper leg, Face   Body parts bathed by helper: Left arm, Buttocks, Right lower leg, Left lower leg     Bathing assist Assist Level: Maximal Assistance - Patient 24 - 49%     Upper Body Dressing/Undressing Upper body dressing   What is the patient wearing?: Pull over shirt, Orthosis    Upper body assist Assist Level: Total Assistance - Patient < 25%    Lower Body Dressing/Undressing Lower body dressing      What is the patient wearing?: Incontinence brief, Pants     Lower body assist Assist for lower body dressing: Total Assistance - Patient < 25%      Toileting Toileting    Toileting assist Assist for toileting: Moderate Assistance - Patient 50 - 74%     Transfers Chair/bed transfer  Transfers assist     Chair/bed transfer assist level: Contact Guard/Touching assist     Locomotion Ambulation   Ambulation assist      Assist level: Contact Guard/Touching assist Assistive device: No Device Max distance: 125   Walk 10 feet activity   Assist     Assist level: Contact Guard/Touching assist Assistive device: No Device   Walk 50 feet activity   Assist    Assist level: Contact Guard/Touching assist Assistive device: No Device    Walk 150 feet activity   Assist    Assist level: Contact Guard/Touching assist Assistive device: No Device    Walk 10 feet on uneven surface  activity   Assist     Assist level: Contact Guard/Touching assist Assistive device: Other (comment) (no AD)   Wheelchair     Assist Is the patient using a wheelchair?: Yes Type of Wheelchair: Manual    Wheelchair assist level: Supervision/Verbal cueing Max wheelchair distance: 150 ft    Wheelchair 50 feet with 2 turns activity    Assist        Assist Level: Supervision/Verbal cueing   Wheelchair 150 feet activity     Assist      Assist Level: Supervision/Verbal cueing   Blood pressure 106/64, pulse 60, temperature 98.8 F (37.1 C), temperature source Oral, resp. rate 18, height 5\' 11"  (1.803 m), weight 62.7 kg, SpO2 97 %.  Medical Problem List and Plan: 1. Functional deficits secondary to traumatic SDH status post MMA embolization             -patient may shower             -ELOS/Goals: 16-18 days, Min A PT/OT/SLP - DC date 5/11  -Continue CIR  -Plan is home with brother; on STD -4/30: ADLs limited by cognitive deficits to CGA; Min A for ambulation; may be upgrading goals for stand mobility; SLP memory and awareness deficits limiting; low vocal intensity baseline? - grounds pass 5/3 a.m. for  daughter's graduation; has completed family training and will be maintained at wheelchair level with min assist to CGA for transfers   2.  Antithrombotics: -DVT/anticoagulation:  Pharmaceutical: Lovenox 30 mg BID continued             -antiplatelet therapy: none   3. Pain Management: Avoid Tylenol; oxycodone 5-10mg  q4h PRN, Robaxin PRN -10/08/22 Per pharmacy, was on gabapentin outpatient; defer to primary team whether this is needing to be restarted.  Also encouraged pt to utilize PRN pain meds since he's endorsing pain but no meds have been given -10/09/22 pain improving, remembered once yesterday to ask for pain meds -4/29 utilized 1 dose of 5 mg oxycodone this morning, pain gradually improving, will restart low-dose gabapentin 100 mg 3 times daily -4-30: Pain well-controlled, monitor -5-1: Discontinue Oxy 10 mg as needed; maintain Oxy 5 mg as needed.-Well-controlled -5-3: Decrease oxy 5 mg as needed to every 8 hours   4. Mood/Behavior/Sleep: LCSW to evaluate and provide emotional support             -antipsychotic agents: n/a  -Trazodone PRN -10/09/22 apparently some nighttime confusion and day/night confusion, napping a lot during the day; advised avoidance of naps during the day to keep sleep schedule better; will get sleep log tonight; monitor -4-30 - 5/1: Sleep log shows sleeping 10+ hours per night.  Feeling well rested.  Likely secondary to substance withdrawal.-Sleep improved -10/15/22 sleeping improved, slept ~7hrs last night; monitor   5. Neuropsych/cognition: This patient is not capable of making decisions on his own behalf.   6. Skin/Wound Care: Routine skin care checks   7. Fluids/Electrolytes/Nutrition: Routine Is and Os and follow-up chemistries             -continue thiamine and folic acid supplements, and MVI             -10/08/22 Vit B12 level WNL at 865   8: Alcoholism: on Librium taper, ended prior to CIR admission; encourage cessation -10/09/22 some overnight confusion;  wonder if it's from mild withdrawal vs daytime napping? Monitor for trend/pattern for now -4/29 restart low-dose gabapentin -improved, monitor   9: History of gastric ulcers, non-bleeding: 2021 -continue Protonix 40mg  QD while hospitalized, sucralfate not restarted at this time   10: Hypertension: Monitor TID and prn; has been on lisinopril 10 mg in the past ([facility] >>he did not follow-up) but not restarted             -has been normotensive this admission  -4/29-5/5 well-controlled continue to monitor Vitals:   10/12/22 1453 10/12/22 1918 10/13/22 0450 10/13/22 1144  BP: 98/66 (!) 111/59 107/70 109/76   10/13/22 2016 10/14/22 0531 10/14/22 1537 10/14/22 2028  BP: 118/78 118/75 108/71 107/71   10/15/22 0527 10/15/22 1415 10/15/22 1952 10/16/22 0356  BP: 103/66 101/71 100/64 106/64      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 Conchita Paris   12: Right proximal humerus s/p ORIF 09/27/22              -NWB RUE, ok for AROM at elbow, wrist, and fingers -keep in sling -ordering alternate sling due to it being soiled 4-30; received 5-1             -follow-up with Dr. Dion Saucier -Per therapies, need to maintain in sling at all times to remind of nonweightbearing status; otherwise is impulsive   13: Macrocytic anemia: Hgb stable at 10.8 on 10/08/22, monitor routine labs; B12 level WNL as above   14: Thrombocytopenia: improved on 10/08/22 labs, monitor on routine labs  15: Constipation: no BM in several days, or longer (?1wk) -10/08/22 still no BM, will give Sorbitol 60ml x1 now, monitor for effect; continue miralax 17g BID, senokot S 1 tab BID, and PRNs -10/09/22 pt states BM this AM, not well documented; monitor, asked nursing to please try to document Bms -10/15/22  LBM 2 days ago, normal for him, monitor for now but may need to change regimen if no BM in next day or so  -10/16/22 LBM yesterday, cont regimen   16: Hypokalemia  -4/29  will order 30 mEq potassium K-Dur -resolved 5-1, potassium 3.8.   17: Urinary incontinence.  Generally nocturia, patient denies.  Reinstate PVRs -10/15/22 no further incontinence last night and no PVRs recorded but bladder scans show 0, so hopefully this has resolved; monitor -10/16/22 low PVRs, monitor but seems to be improving  LOS: 9 days A FACE TO FACE EVALUATION WAS PERFORMED  [address] 10/16/2022, 11:39 AM

## 2022-10-16 NOTE — Progress Notes (Signed)
Speech Language Pathology Daily Session Note  Patient Details  Name: Richard Andrews MRN: [id number] Date of Birth: [birth year]  Today's Date: 10/16/2022 SLP Individual Time: [id number] SLP Individual Time Calculation (min): 45 min  Short Term Goals: Week 2: SLP Short Term Goal 1 (Week 2): Pt will recall functional, novel information (weight bearing precautions, etc) with 50% accuracy provided mod A verbal cues SLP Short Term Goal 2 (Week 2): Pt will ID 2 physical and 2 cognitive changes s/p fall/hospitalization with mod A cues SLP Short Term Goal 3 (Week 2): Patient will utilize speech intelligibility strategies at the conversation level with mod multimodal cues to achieve 90% intelligibility. SLP Short Term Goal 4 (Week 2): Patient will demonstrate sustained attention to functional tasks for 20 mintues with Mod verbal cues for redirection. SLP Short Term Goal 5 (Week 2): Patient will increase error awareness and repair during structured and functional tasks utilizing strategies as trained with min verbal and visual A  Skilled Therapeutic Interventions: Pt seen for skilled ST with focus on cognitive goals, pt in bed and agreeable to therapeutic tasks. SLP facilitating simple recall task by providing min-mod verbal cues for 70% accuracy. Pt demonstrates increased awareness of precautions at this time however still requires education and training on use of call button for all assistance/transfers/needs. Pt reports "when I stand, I have to catch my boundaries or else I will fall". SLP facilitating ongoing medication management task by providing mod A cues for ~50% accuracy, continues to have diminished awareness of current medication regime and rationale behind prescriptions. Pt completing reading comprehension task provided extra time and min A cues for 100% accuracy, sustained attention within functional limits during this 8-10 minute structured activity. Pt left in bed with alarm set and all needs met,  cont ST POC.   Pain Pain Assessment Pain Scale: 0-10 Pain Score: 0-No pain  Therapy/Group: Individual Therapy  Tacey Ruiz 10/16/2022, 11:02 AM

## 2022-10-16 NOTE — Progress Notes (Signed)
Occupational Therapy Session Note  Patient Details  Name: Richard Andrews MRN: [id number] Date of Birth: 01-12-1961  Today's Date: 10/16/2022 OT Individual Time: 1400-1453 OT Individual Time Calculation (min): 53 min    Short Term Goals: Week 1:  OT Short Term Goal 1 (Week 1): Pt will complete 2/4 steps of UB dressing wiht MOD A OT Short Term Goal 2 (Week 1): Pt will don sling wiht MOD A OT Short Term Goal 3 (Week 1): Pt will thread BLE into pants wiht AE PRN OT Short Term Goal 4 (Week 1): Pt will stand at sink wiht no more than MIN A to demo improved balance during grooming tasks   Skilled Therapeutic Interventions/Progress Updates:    Pt semi reclined in bed, sling around neck but arm not inside it.  Required max assist to reposition RUE safely in sling.  Pt agreeable to session.  Pt completed all functional mobility with CGA without AD.  Pt ambulated to sink and brushed teeth, then ambulated in hallways >300 feet without rest breaks needed.  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  He reports that he works on the computer 95% of the time and has to multitask a lot on the job.  He reports feeling ready to go back to work and does not feel he has any cognitive deficits at this time.  Completed working memory task on [employer] in standing with pt needing mod to max cues to successfully recall sequences of 4 or more words when provided visual and auditory flash of 5 seconds initially.  Pt ambulated back to room and worked on word search and crossword puzzles provided by therapist needing min cues to identify errors and correct.  Call bell in reach, bed alarm on at end of session.  Therapy Documentation Precautions:  Precautions Precautions: Fall, Shoulder Shoulder Interventions: Shoulder sling/immobilizer, At all times, Off for dressing/bathing/exercises Precaution Comments: OK for AROM at elbow, wrist, and digits only Required Braces or Orthoses:  Sling Restrictions Weight Bearing Restrictions: Yes RUE Weight Bearing: Weight bearing as tolerated   Therapy/Group: Individual Therapy  Amie Critchley 10/16/2022, 3:52 PM

## 2022-10-17 LAB — CBC
HCT: 31.3 % — ABNORMAL LOW (ref 39.0–52.0)
Hemoglobin: 10.6 g/dL — ABNORMAL LOW (ref 13.0–17.0)
MCH: 35.2 pg — ABNORMAL HIGH (ref 26.0–34.0)
MCHC: 33.9 g/dL (ref 30.0–36.0)
MCV: 104 fL — ABNORMAL HIGH (ref 80.0–100.0)
Platelets: 152 10*3/uL (ref 150–400)
RBC: 3.01 MIL/uL — ABNORMAL LOW (ref 4.22–5.81)
RDW: 12.3 % (ref 11.5–15.5)
WBC: 3.2 10*3/uL — ABNORMAL LOW (ref 4.0–10.5)
nRBC: 0 % (ref 0.0–0.2)

## 2022-10-17 LAB — BASIC METABOLIC PANEL
Anion gap: 9 (ref 5–15)
BUN: 10 mg/dL (ref 8–23)
CO2: 25 mmol/L (ref 22–32)
Calcium: 9.4 mg/dL (ref 8.9–10.3)
Chloride: 102 mmol/L (ref 98–111)
Creatinine, Ser: 0.95 mg/dL (ref 0.61–1.24)
GFR, Estimated: >60 mL/min (ref >60)
Glucose, Bld: 102 mg/dL — ABNORMAL HIGH (ref 70–99)
Potassium: 3.5 mmol/L (ref 3.5–5.1)
Sodium: 136 mmol/L (ref 135–145)

## 2022-10-17 MED ORDER — OXYCODONE HCL 5 MG PO TABS
5.0000 mg | ORAL_TABLET | Freq: Two times a day (BID) | ORAL | Status: DC | PRN
  Administered 2022-10-18 – 2022-10-20 (×5): 5 mg via ORAL
  Filled 2022-10-17 (×5): qty 1

## 2022-10-17 NOTE — Progress Notes (Signed)
Physical Therapy Session Note  Patient Details  Name: Richard Andrews MRN: [id number] Date of Birth: 22-Apr-1961  Today's Date: 10/17/2022 PT Individual Time: [phone] and [id number] PT Individual Time Calculation (min): 54 min and 28 min  Short Term Goals: Week 2:  PT Short Term Goal 1 (Week 2): STGs=LTGs secondary to ELOS  Skilled Therapeutic Interventions/Progress Updates:     1st Session: Pt received supine in bed and agrees to therapy. Reports slight pain in R shoulder. Number not provided. PT provides rest breaks as needed to manage pain. Pt performs bed mobility with cues for positioning at EOB. Pt performs sit to stand with cues for anterior weight shifting. Pt ambulates x150' to main gym with cues for upright gaze to improve posture and balance and increasing gait speed to decrease risk for falls. Following rest break, pt completes x12 6" steps with with L hand rails and cues for step positioning and sequencing, with CGA overall.    Pt completes high level gait training with 9" hurdles. PT demonstrates desired technique to safely step over hurdles and then pt attempts with CGA. Pt successfully clears 2/5 hurdles on first attempt, 3/5 on second attempt, and 5/5 on third attempt. PT cues for positioning and body mechanics for ease and safety of activity. Activity progressed by having pt perform side steps to the R and L over hurdles. Pt is able to complete x10 with in each direction with CGA and cues for foot position and posture. Pt then attempts stepping backward over hurdles and requires minA due with difficulty clearing, knocking each hurdle over, typically with trail foot.  Pt ambulates x150' to dayroom with same cues and assistance. Pt completes Nustep for endurance training. Pt completes x10:00 at workload of 5 with average steps per minute ~25. PT provides cues for foot placement and pt only utilizes lower extremities. Pt ambulates x300' back to room. Left seated in WC with all needs  within reach.   2nd Session: Pt received supine in bed and agrees to therapy. Supine to sit with cues for sequencing. PT assists to correctly don R arm sling while pt is seated at EOB. No complaint of pain. Pt stands with CGA and ambulates x300' with cues for upright gaze and ensuring that pt attends to L visual field. Seated rest break.   Pt participates in high level gait training, tasked with ambulating across floor ladder, placing one foot in each square to promote narrower stride width and increased stride length. Pt completes x8 with PT providing minA for stability and facilitating momentum to allow for adequate stride lengths. Pt takes seated rest break prior to ambulating x300' back to room. Left supine with all needs within reach.     Therapy Documentation Precautions:  Precautions Precautions: Fall, Shoulder Shoulder Interventions: Shoulder sling/immobilizer, At all times, Off for dressing/bathing/exercises Precaution Comments: OK for AROM at elbow, wrist, and digits only Required Braces or Orthoses: Sling Restrictions Weight Bearing Restrictions: Yes RUE Weight Bearing: Weight bearing as tolerated    Therapy/Group: Individual Therapy  Beau Fanny, PT, DPT 10/17/2022, 4:52 PM

## 2022-10-17 NOTE — Progress Notes (Signed)
Speech Language Pathology TBI Note  Patient Details  Name: Richard Andrews MRN: [id number] Date of Birth: April 08, 1961  Today's Date: 10/17/2022 SLP Individual Time: 1000-1100 SLP Individual Time Calculation (min): 60 min  Short Term Goals: Week 2: SLP Short Term Goal 1 (Week 2): Pt will recall functional, novel information (weight bearing precautions, etc) with 50% accuracy provided mod A verbal cues SLP Short Term Goal 2 (Week 2): Pt will ID 2 physical and 2 cognitive changes s/p fall/hospitalization with mod A cues SLP Short Term Goal 3 (Week 2): Patient will utilize speech intelligibility strategies at the conversation level with mod multimodal cues to achieve 90% intelligibility. SLP Short Term Goal 4 (Week 2): Patient will demonstrate sustained attention to functional tasks for 20 mintues with Mod verbal cues for redirection. SLP Short Term Goal 5 (Week 2): Patient will increase error awareness and repair during structured and functional tasks utilizing strategies as trained with min verbal and visual A  Skilled Therapeutic Interventions:  Pt was seen in am to address cognitive re- training. Pt was alert and seated upright in recliner upon SLP arrival. Pt oriented to day of week, month, and year with sup A and use of external visual aid. Pt verbalized date of discharge however, recall of discharge day was poor across session requiring min A. SLP challenged pt in completion of medication management task. SLP reviewed current medication list, rationale, and frequency. Given pill box, pt initially requiring mod to max A with likely impaired working memory and attention. Cues able to be faded to min A with persisting deficits in error awareness warranting clinician intervention. Pt observed to place multiple pills per box with inability to self correct. In additional minutes of session, SLP challenged pt in medication problem solving given scenarios presented verbally. Pt identified solutions with 80%  acc. SLP provided correct response and rationale in additional minutes of session. Pt would benefit from continued medication management task. Pt left upright in recliner with chair alarm active. SLP to continue POC.   Pain Pain Assessment Pain Scale: 0-10 Pain Score: 0-No pain Faces Pain Scale: Hurts a little bit Pain Type: Acute pain Pain Location: Shoulder Pain Orientation: Right Pain Intervention(s): Medication (See eMAR)  Agitated Behavior Scale: TBI Observation Details Observation Environment: CIR Start of observation period - Date: 10/17/22 Start of observation period - Time: 1000 End of observation period - Date: 10/17/22 End of observation period - Time: 1100 Agitated Behavior Scale (DO NOT LEAVE BLANKS) Short attention span, easy distractibility, inability to concentrate: Present to a slight degree Impulsive, impatient, low tolerance for pain or frustration: Absent Uncooperative, resistant to care, demanding: Absent Violent and/or threatening violence toward people or property: Absent Explosive and/or unpredictable anger: Absent Rocking, rubbing, moaning, or other self-stimulating behavior: Absent Pulling at tubes, restraints, etc.: Absent Wandering from treatment areas: Absent Restlessness, pacing, excessive movement: Absent Repetitive behaviors, motor, and/or verbal: Absent Rapid, loud, or excessive talking: Absent Sudden changes of mood: Absent Easily initiated or excessive crying and/or laughter: Absent Self-abusiveness, physical and/or verbal: Absent Agitated behavior scale total score: 15  Therapy/Group: Individual Therapy  Renaee Munda 10/17/2022, 12:31 PM

## 2022-10-17 NOTE — Progress Notes (Signed)
Occupational Therapy Note  Patient Details  Name: Richard Andrews MRN: [id number] Date of Birth: 14-Sep-1960  Today's Date: 10/17/2022 OT Missed Time: 30 Minutes Missed Time Reason: Patient fatigue  Patient greeted semi-reclined in bed. Pt reported he just got back in the bed and declined to participate in 30 minute session despite encouragement. Pt left semi-reclined in bed with bed alarm on, call bell in reach, and needs met.    [name] Richard Andrews 10/17/2022, 11:57 AM

## 2022-10-17 NOTE — Progress Notes (Signed)
Occupational Therapy TBI Note  Patient Details  Name: Richard Andrews MRN: [id number] Date of Birth: 10-26-1960  Today's Date: 10/17/2022 OT Individual Time: 4098-1191 OT Individual Time Calculation (min): 40 min    Short Term Goals: Week 1:  OT Short Term Goal 1 (Week 1): Pt will complete 2/4 steps of UB dressing wiht MOD A OT Short Term Goal 2 (Week 1): Pt will don sling wiht MOD A OT Short Term Goal 3 (Week 1): Pt will thread BLE into pants wiht AE PRN OT Short Term Goal 4 (Week 1): Pt will stand at sink wiht no more than MIN A to demo improved balance during grooming tasks  Skilled Therapeutic Interventions/Progress Updates:    Pt received supine with no c/o pain, agreeable to OT session. Sling poorly fitted- sitting on shoulder. He came to EOB with mod I. He donned shoes mod I. Functional mobility with wide BOS to the therapy gym, 100 ft, with (S). Focused initially on dual task processing- completing blocked sit <> stand with overhead LUE reach holding a 4 lb dumbbell to challenge endurance and ADL transfers- combined with sequencing animal naming task. Pt with slow processing, mod cueing to continue squatting when thinking, however overall 90% successful in recall. Then provided gentle elbow PROM with stabilization at the shoulder provided. He then completed dynamic lateral stepping task using a visual target on the ground. 50 ft completed total R and L. He required min facilitation at the hips to maintain lateral stance with poor motor planning at times. He returned to his room and was left supine with all needs met, bed alarm set.     Therapy Documentation Precautions:  Precautions Precautions: Fall, Shoulder Shoulder Interventions: Shoulder sling/immobilizer, At all times, Off for dressing/bathing/exercises Precaution Comments: OK for AROM at elbow, wrist, and digits only Required Braces or Orthoses: Sling Restrictions Weight Bearing Restrictions: Yes RUE Weight Bearing: Weight  bearing as tolerated    Agitated Behavior Scale: TBI Observation Details Observation Environment: CIR Start of observation period - Date: 10/17/22 Start of observation period - Time: 0730 End of observation period - Date: 10/17/22 End of observation period - Time: 0815 Agitated Behavior Scale (DO NOT LEAVE BLANKS) Short attention span, easy distractibility, inability to concentrate: Absent Impulsive, impatient, low tolerance for pain or frustration: Absent Uncooperative, resistant to care, demanding: Absent Violent and/or threatening violence toward people or property: Absent Explosive and/or unpredictable anger: Absent Rocking, rubbing, moaning, or other self-stimulating behavior: Absent Pulling at tubes, restraints, etc.: Absent Wandering from treatment areas: Absent Restlessness, pacing, excessive movement: Absent Repetitive behaviors, motor, and/or verbal: Absent Rapid, loud, or excessive talking: Absent Sudden changes of mood: Absent Easily initiated or excessive crying and/or laughter: Absent Self-abusiveness, physical and/or verbal: Absent Agitated behavior scale total score: 14    Therapy/Group: Individual Therapy  Crissie Reese 10/17/2022, 8:08 AM

## 2022-10-17 NOTE — Progress Notes (Signed)
PROGRESS NOTE   Subjective/Complaints:  No acute complaints.  No events overnight.  Continues with some disrupted sleep, but states overall well-controlled.  Discussed further reducing pain medication today, patient reluctant but agreeable.  Per week and report, remains impulsive with right upper extremity when out of brace.  ROS:  +Constipation-improved/at baseline  +Insomnia-early a.m. confusion-improving/stable + Pain -improved Denies fevers, chills, CP, SOB, cough, abdominal pain, N/V/D, new/worsening paresthesias/weakness, or any other complaints at this time.     Objective:   No results found. Recent Labs    10/17/22 0608  WBC 3.2*  HGB 10.6*  HCT 31.3*  PLT 152    Recent Labs    10/17/22 0608  NA 136  K 3.5  CL 102  CO2 25  GLUCOSE 102*  BUN 10  CREATININE 0.95  CALCIUM 9.4     Intake/Output Summary (Last 24 hours) at 10/17/2022 1337 Last data filed at 10/17/2022 1309 Gross per 24 hour  Intake 958 ml  Output 0 ml  Net 958 ml         Physical Exam: Vital Signs Blood pressure 99/62, pulse 61, temperature 98.1 F (36.7 C), temperature source Oral, resp. rate 19, height 5\' 11"  (1.803 m), weight 62.7 kg, SpO2 98 %.  Constitutional: No apparent distress. Appropriate appearance for age.  Resting in bed. HENT: No JVD. Neck Supple. Trachea midline. Atraumatic, normocephalic. Eyes: PERRLA. EOMI. Visual fields grossly intact.  No appreciable nystagmus at rest. Cardiovascular: mildly bradycardic, reg rhythm, no murmurs/rub/gallops. No Edema.  Right upper extremity capillary refill brisk. Respiratory: CTAB. No rales, rhonchi, or wheezing. On RA.  Abdomen: + bowel sounds, normoactive. No distention or tenderness.  GU: Not examined. Skin: RUE surgical dressing removed Steri-Strips status post removal, incisional site appears clean/dry/intact.   MsK: R shoulder in sling.  Full active range of motion of right  hand, wrist      Strength:                RUE: Grip, finger abduction 5 out of 5                LUE: 5/5 SA, 5/5 EF, 5/5 EE, 5/5 WE, 5/5 FF, 5/5 FA                 RLE: 5/5 HF, 5/5 KE, 5/5 DF, 5/5 EHL, 5/5 PF                 LLE:  5/5 HF, 5/5 KE, 5/5 DF, 5/5 EHL, 5/5 PF    Neurologic exam:  Cognition: AAO to person, place, and time.  + Lethargy, mild delay-resolved Can add change.  Cannot spell world backwards. Language: Fluent, in short phrases.   + Dysarthria and occasional  neoglisms.  Resolved + Memory deficit - improving but persistent   Insight: Poor insight into current condition.  Mood: Flat affect, appropriate mood.  Sensation: To light touch intact in BL UEs and LEs  Reflexes: 2+ in BL UE and LEs.   CN: + mild left facial droop; otherwise intact Coordination: No notable ataxia on ambulation Spasticity: MAS 0 in all extremities.  Assessment/Plan: 1. Functional deficits which require 3+ hours per  day of interdisciplinary therapy in a comprehensive inpatient rehab setting. Physiatrist is providing close team supervision and 24 hour management of active medical problems listed below. Physiatrist and rehab team continue to assess barriers to discharge/monitor patient progress toward functional and medical goals  Care Tool:  Bathing    Body parts bathed by patient: Right arm, Chest, Abdomen, Front perineal area, Right upper leg, Left upper leg, Face   Body parts bathed by helper: Left arm, Buttocks, Right lower leg, Left lower leg     Bathing assist Assist Level: Maximal Assistance - Patient 24 - 49%     Upper Body Dressing/Undressing Upper body dressing   What is the patient wearing?: Pull over shirt, Orthosis    Upper body assist Assist Level: Total Assistance - Patient < 25%    Lower Body Dressing/Undressing Lower body dressing      What is the patient wearing?: Incontinence brief, Pants     Lower body assist Assist for lower body dressing: Total  Assistance - Patient < 25%     Toileting Toileting    Toileting assist Assist for toileting: Minimal Assistance - Patient > 75%     Transfers Chair/bed transfer  Transfers assist     Chair/bed transfer assist level: Contact Guard/Touching assist     Locomotion Ambulation   Ambulation assist      Assist level: Contact Guard/Touching assist Assistive device: No Device Max distance: 125   Walk 10 feet activity   Assist     Assist level: Contact Guard/Touching assist Assistive device: No Device   Walk 50 feet activity   Assist    Assist level: Contact Guard/Touching assist Assistive device: No Device    Walk 150 feet activity   Assist    Assist level: Contact Guard/Touching assist Assistive device: No Device    Walk 10 feet on uneven surface  activity   Assist     Assist level: Contact Guard/Touching assist Assistive device: Other (comment) (no AD)   Wheelchair     Assist Is the patient using a wheelchair?: Yes Type of Wheelchair: Manual    Wheelchair assist level: Supervision/Verbal cueing Max wheelchair distance: 150 ft    Wheelchair 50 feet with 2 turns activity    Assist        Assist Level: Supervision/Verbal cueing   Wheelchair 150 feet activity     Assist      Assist Level: Supervision/Verbal cueing   Blood pressure 99/62, pulse 61, temperature 98.1 F (36.7 C), temperature source Oral, resp. rate 19, height 5\' 11"  (1.803 m), weight 62.7 kg, SpO2 98 %.  Medical Problem List and Plan: 1. Functional deficits secondary to traumatic SDH status post MMA embolization             -patient may shower             -ELOS/Goals: 16-18 days, Min A PT/OT/SLP - DC date 5/11  -Continue CIR  -Plan is home with brother; on STD -4/30: ADLs limited by cognitive deficits to CGA; Min A for ambulation; may be upgrading goals for stand mobility; SLP memory and awareness deficits limiting; low vocal intensity baseline? - grounds  pass 5/3 a.m. for daughter's graduation; has completed family training and will be maintained at wheelchair level with min assist to CGA for transfers   2.  Antithrombotics: -DVT/anticoagulation:  Pharmaceutical: Lovenox 30 mg BID continued             -antiplatelet therapy: none   3. Pain Management: Avoid  Tylenol; oxycodone 5-10mg  q4h PRN, Robaxin PRN -10/08/22 Per pharmacy, was on gabapentin outpatient; defer to primary team whether this is needing to be restarted. Also encouraged pt to utilize PRN pain meds since he's endorsing pain but no meds have been given -10/09/22 pain improving, remembered once yesterday to ask for pain meds -4/29 utilized 1 dose of 5 mg oxycodone this morning, pain gradually improving, will restart low-dose gabapentin 100 mg 3 times daily -4-30: Pain well-controlled, monitor -5-1: Discontinue Oxy 10 mg as needed; maintain Oxy 5 mg as needed.-Well-controlled -5-3: Decrease oxy 5 mg as needed to every 8 hours 5-6: Decrease oxycodone to 5 mg every 12 hours as needed.  Will not continue oxycodone at discharge.   4. Mood/Behavior/Sleep: LCSW to evaluate and provide emotional support             -antipsychotic agents: n/a  -Trazodone PRN -10/09/22 apparently some nighttime confusion and day/night confusion, napping a lot during the day; advised avoidance of naps during the day to keep sleep schedule better; will get sleep log tonight; monitor -4-30 - 5/1: Sleep log shows sleeping 10+ hours per night.  Feeling well rested.  Likely secondary to substance withdrawal.-Sleep improved -10/15/22 sleeping improved, slept ~7hrs last night; monitor 5-6: Endorses disrupted sleep, but stable, feeling well rested.  Monitor.   5. Neuropsych/cognition: This patient is not capable of making decisions on his own behalf.   6. Skin/Wound Care: Routine skin care checks   7. Fluids/Electrolytes/Nutrition: Routine Is and Os and follow-up chemistries             -continue thiamine and folic  acid supplements, and MVI             -10/08/22 Vit B12 level WNL at 865   8: Alcoholism: on Librium taper, ended prior to CIR admission; encourage cessation -10/09/22 some overnight confusion; wonder if it's from mild withdrawal vs daytime napping? Monitor for trend/pattern for now -4/29 restart low-dose gabapentin -improved, monitor   9: History of gastric ulcers, non-bleeding: 2021 -continue Protonix 40mg  QD while hospitalized, sucralfate not restarted at this time   10: Hypertension: Monitor TID and prn; has been on lisinopril 10 mg in the past ([facility]>>he did not follow-up) but not restarted             -has been normotensive this admission  -4/29-5/5 well-controlled continue to monitor Vitals:   10/13/22 2016 10/14/22 0531 10/14/22 1537 10/14/22 2028  BP: 118/78 118/75 108/71 107/71   10/15/22 0527 10/15/22 1415 10/15/22 1952 10/16/22 0356  BP: 103/66 101/71 100/64 106/64   10/16/22 1451 10/16/22 2030 10/17/22 0402 10/17/22 1319  BP: 103/64 100/68 106/70 99/62      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 Conchita Paris   12: Right proximal humerus s/p ORIF 09/27/22              -NWB RUE, ok for AROM at elbow, wrist, and fingers -keep in sling -ordering alternate sling due to it being soiled 4-30; received 5-1             -follow-up with Dr. Dion Saucier -Per therapies, need to maintain in sling at all times to remind of nonweightbearing status; otherwise is impulsive   13: Macrocytic anemia: Hgb stable at 10.8 on 10/08/22, monitor routine labs; B12 level WNL as above   14: Thrombocytopenia: improved on 10/08/22 labs, monitor on routine labs  15: Constipation: no BM in several days, or longer (?1wk) -10/08/22 still no BM, will give Sorbitol 60ml x1 now, monitor for effect; continue miralax 17g BID, senokot S 1 tab BID, and PRNs -10/09/22 pt states BM this AM, not well documented; monitor, asked nursing to please try to  document Bms -10/15/22 LBM 2 days ago, normal for him, monitor for now but may need to change regimen if no BM in next day or so  -10/16/22 LBM yesterday, cont regimen   16: Hypokalemia  -4/29 will order 30 mEq potassium K-Dur -resolved 5-1, potassium 3.8.   17: Urinary incontinence.  Generally nocturia, patient denies.  Reinstate PVRs -10/15/22 no further incontinence last night and no PVRs recorded but bladder scans show 0, so hopefully this has resolved; monitor -10/16/22 low PVRs, monitor but seems to be improving 5-6: Remain low, monitor    LOS: 10 days A FACE TO FACE EVALUATION WAS PERFORMED  Angelina Sheriff 10/17/2022, 1:37 PM

## 2022-10-18 NOTE — Progress Notes (Addendum)
Occupational Therapy Weekly Progress Note  Patient Details  Name: Richard Andrews MRN: [id number] Date of Birth: 1961/04/04  Beginning of progress report period: October 08, 2022 End of progress report period: Oct 18, 2022  Today's Date: 10/18/2022 OT Individual Time: 4098-1191 OT Individual Time Calculation (min): 58 min    Patient has met 4 of 4 short term goals.  Patient is making steady progress towards OT goals. He currently is at a min/CGA level for BADL tasks. He requires cues to maintain NWB R UE and to only move elbow/wrist/hand, no shoulder ROM. Continue current POC.  Patient continues to demonstrate the following deficits: muscle weakness, decreased cardiorespiratoy endurance, and decreased balance strategies and difficulty maintaining precautions and therefore will continue to benefit from skilled OT intervention to enhance overall performance with BADL, iADL, and Reduce care partner burden.  Patient progressing toward long term goals..  Continue plan of care.  OT Short Term Goals Week 1:  OT Short Term Goal 1 (Week 1): Pt will complete 2/4 steps of UB dressing wiht MOD A OT Short Term Goal 1 - Progress (Week 1): Met OT Short Term Goal 2 (Week 1): Pt will don sling wiht MOD A OT Short Term Goal 2 - Progress (Week 1): Met OT Short Term Goal 3 (Week 1): Pt will thread BLE into pants wiht AE PRN OT Short Term Goal 3 - Progress (Week 1): Met OT Short Term Goal 4 (Week 1): Pt will stand at sink wiht no more than MIN A to demo improved balance during grooming tasks OT Short Term Goal 4 - Progress (Week 1): Met Week 2:  OT Short Term Goal 1 (Week 2): LTG=STG 2/2 ELOS  Skilled Therapeutic Interventions/Progress Updates:    Pt greeted semi-reclined in bed and agreeable to OT treatment session. Pt wanted to shower today. Pt ambulated to bathroom w/ CGA. Pt sat on commode and voided bladder. He doffed clothing with min A while seated on commode. Pt needed moderate cues not to use R UE when  sling removed. Pt with very poor awareness of precautions on R shoulder. Bathing tasks completed with overall CGA and cues to not use R UE again. Blocked practice for UB dressing techniques and donning/doffing sling. Found it is easier for pt to pull sling overhead. Difficulty orienting sling initially, but with repetition, was able to complete with min cues and supervision. Pt ambulated to therapy dayroom with CGA. Seated rest break then 10 squats.  Pt ambulated to therapy apartment and we practiced stepping over tub ledge with min A, or sliding over ledge with tub transfer bench and supervision. Pt ambulated back to room and was left seated in recliner with alarm belt on, call bell in reach and needs met.   Therapy Documentation Precautions:  Precautions Precautions: Fall, Shoulder Shoulder Interventions: Shoulder sling/immobilizer, At all times, Off for dressing/bathing/exercises Precaution Comments: OK for AROM at elbow, wrist, and digits only Required Braces or Orthoses: Sling Restrictions Weight Bearing Restrictions: Yes RUE Weight Bearing: Weight bearing as tolerated Pain: 6/10- L shoulder, rest and repositioned   Therapy/Group: Individual Therapy  [name] 10/18/2022, 7:58 AM

## 2022-10-18 NOTE — Progress Notes (Signed)
Occupational Therapy TBI Note  Patient Details  Name: Richard Andrews MRN: [id number] Date of Birth: 01-31-1961  Today's Date: 10/18/2022 OT Individual Time: 1132-1200 OT Individual Time Calculation (min): 28 min    Short Term Goals: Week 1:  OT Short Term Goal 1 (Week 1): Pt will complete 2/4 steps of UB dressing wiht MOD A OT Short Term Goal 1 - Progress (Week 1): Met OT Short Term Goal 2 (Week 1): Pt will don sling wiht MOD A OT Short Term Goal 2 - Progress (Week 1): Met OT Short Term Goal 3 (Week 1): Pt will thread BLE into pants wiht AE PRN OT Short Term Goal 3 - Progress (Week 1): Met OT Short Term Goal 4 (Week 1): Pt will stand at sink wiht no more than MIN A to demo improved balance during grooming tasks OT Short Term Goal 4 - Progress (Week 1): Met  Skilled Therapeutic Interventions/Progress Updates:    Pt received in bed with no pain Therapeutic activity Pt completes functional mobility to outside courtyard with the knowledge that he needed to find his way back to his room. Pt ambulates to/from all locations with supervision. Pt ableot walk over uneven surfaces, up/down ramp and in hallways. Pt able to make all correct turns back to room, except misses room with no awareness.  Pt left at end of session in bed with exit alarm on, call light in reach and all needs met   Therapy Documentation Precautions:  Precautions Precautions: Fall, Shoulder Shoulder Interventions: Shoulder sling/immobilizer, At all times, Off for dressing/bathing/exercises Precaution Comments: OK for AROM at elbow, wrist, and digits only Required Braces or Orthoses: Sling Restrictions Weight Bearing Restrictions: Yes RUE Weight Bearing: Weight bearing as tolerated General:     Agitated Behavior Scale: TBI   ABS discontinued d/t ABS score less than 20 for the last three days or no behaviors present  Therapy/Group: Individual Therapy  Shon Hale 10/18/2022, 6:52 AM

## 2022-10-18 NOTE — Plan of Care (Signed)
  Problem: RH Expression Communication Goal: LTG Patient will increase speech intelligibility (SLP) Description: LTG: Patient will increase speech intelligibility at word/phrase/conversation level with cues, % of the time (SLP) Flowsheets (Taken 10/18/2022 0617) LTG: Patient will increase speech intelligibility (SLP): Minimal Assistance - Patient > 75% Note: Goal downgraded due to slow progress    Problem: RH Problem Solving Goal: LTG Patient will demonstrate problem solving for (SLP) Description: LTG:  Patient will demonstrate problem solving for basic/complex daily situations with cues  (SLP) Flowsheets (Taken 10/18/2022 0617) LTG Patient will demonstrate problem solving for: Minimal Assistance - Patient > 75% Note: Goal downgraded due to slow progress    Problem: RH Memory Goal: LTG Patient will demonstrate ability for day to day (SLP) Description: LTG:   Patient will demonstrate ability for day to day recall/carryover during cognitive/linguistic activities with assist  (SLP) Flowsheets (Taken 10/18/2022 0617) LTG: Patient will demonstrate ability for day to day recall/carryover during cognitive/linguistic activities with assist (SLP): Minimal Assistance - Patient > 75% Note: Goal downgraded due to slow progress    Problem: RH Attention Goal: LTG Patient will demonstrate this level of attention during functional activites (SLP) Description: LTG:  Patient will will demonstrate this level of attention during functional activites (SLP) Flowsheets (Taken 10/18/2022 0617) LTG: Patient will demonstrate this level of attention during cognitive/linguistic activities with assistance of (SLP): Minimal Assistance - Patient > 75% Note: Goal downgraded due to slow progress    Problem: RH Awareness Goal: LTG: Patient will demonstrate awareness during functional activites type of (SLP) Description: LTG: Patient will demonstrate awareness during functional activites type of (SLP) Flowsheets (Taken 10/18/2022  0617) LTG: Patient will demonstrate awareness during cognitive/linguistic activities with assistance of (SLP): Minimal Assistance - Patient > 75% Note: Goal downgraded due to slow progress

## 2022-10-18 NOTE — Patient Care Conference (Signed)
Inpatient RehabilitationTeam Conference and Plan of Care Update Date: 10/18/2022   Time: 10:30 AM   Patient Name: Richard Andrews      Medical Record Number: [id number]  Date of Birth: Mar 02, 1961 Sex: Male         Room/Bed: 4M13C/4M13C-01 Payor Info: Payor: BLUE CROSS BLUE [employer]IELD / Plan: BCBS COMM PPO / Product Type: *No Product type* /    Admit Date/Time:  10/07/2022  3:58 PM  Primary Diagnosis:  Subdural hematoma [facility])  Hospital Problems: Principal Problem:   Subdural hematoma [facility])    Expected Discharge Date: Expected Discharge Date: 10/22/22  Team Members Present: Physician leading conference: Dr. Elijah Birk Social Worker Present: Cecile Sheerer, LCSWA Nurse Present: Vedia Pereyra, RN PT Present: Amedeo Plenty, PT OT Present: Kearney Hard, OT SLP Present: Feliberto Gottron, SLP PPS Coordinator present : Edson Snowball, PT     Current Status/Progress Goal Weekly Team Focus  Bowel/Bladder   Patient is continent of bladder and bowel, PVR's monitoring with low volumes recorded   Remain continent   Assess toileting needs and address concerns QS/PRN    Swallow/Nutrition/ Hydration               ADL's   CGA/minA for BADL tasks   CGA/SBA overall. Min A dressing   Self-care activities, elbow/wrist/hand ROM, pt.family education, dc planning    Mobility   ModI bed mobility; SBA sit/stands and transfers; CGA/Supv for gait   Supv for all functional mobility; MinA for stair mobility  Dynamic stability, endurance/activity tolerance, global strengthening, cognition, discharge planning, education, family training, higher-level gait, etc.    Communication   mod for use of speech intelligibility strategies   sup A   use of strategies at sentence level to improve intelligibility    Safety/Cognition/ Behavioral Observations  Rancho Level VI-VII, Min-Mod A   Sup A   medication managment, problem solving, error awareness, attention    Pain   patient complain of right  shoulder pain range for pain 2-6/10 on pain scale   pain 2/10   Assess pain QS and prn with follow up assessment    Skin   Generalized bruising to surgical right shoulder,   No indication of infection  Assess surgical right shoulder and skin QS/PRN,      Discharge Planning:  Discharge plan remains that he will return to the home with his brother in law. Pt wife to assist with care needs despite not living together. SW will confirm there are no barriers to discharge.   Team Discussion: SDH. Rancho level VI-VII. Right humeral fracture. NWB with shoulder immobilizer. Steri-strips, no drainage. Incision to right groin has unattached edges, no drainage. Pain managed with PRN medications. Sleep chart. Trazodone as needed. Cognition stable. Does require cues to not use RUE on daily basis. Has poor carryover from one day to the next. Speech working on higher level task.  Patient on target to meet rehab goals: OT/PT are on track to meet goals. ST will have to downgrade some goals to MinA.  *See Care Plan and progress notes for long and short-term goals.   Revisions to Treatment Plan:  Monitor labs/VS  Teaching Needs: Medications, safety, self care, gait/transfer training, skin care, etc.   Current Barriers to Discharge: Decreased caregiver support, Incontinence, Wound care, Weight bearing restrictions, and Behavior  Possible Resolutions to Barriers: Family education, nursing education, bowel/bladder retraining, adhere to weight bearing restrictions, encourage alcohol cessation, order recommended DME     Medical Summary Current Status: medically complicated  by poor pain control s/p R humeral fracture, insomnia, Hx alcohol abuse, intermittent urinary incontinence, constipation, cognitive deficits and anemia/leukopenia/thrombocytopenia  Barriers to Discharge: Behavior/Mood;Medical stability;Self-care education;Uncontrolled Pain;Weight bearing restrictions  Barriers to Discharge Comments: poor  safety awareness and pain in R shoulder, symptomatic alcohol withdrawal, intermittent incontinence Possible Resolutions to [employer] Focus: weaning pain medications as tolerated, medication assistance for symptoms of withdrawal, PVRs/blader scans   Continued Need for Acute Rehabilitation Level of Care: The patient requires daily medical management by a physician with specialized training in physical medicine and rehabilitation for the following reasons: Direction of a multidisciplinary physical rehabilitation program to maximize functional independence : Yes Medical management of patient stability for increased activity during participation in an intensive rehabilitation regime.: Yes Analysis of laboratory values and/or radiology reports with any subsequent need for medication adjustment and/or medical intervention. : Yes   I attest that I was present, lead the team conference, and concur with the assessment and plan of the team.   Jearld Adjutant 10/18/2022, 2:20 PM

## 2022-10-18 NOTE — Progress Notes (Addendum)
Patient ID: Richard Andrews, male   DOB: [birth year], 62 y.o.   MRN: [id number]  SW met with pt in room to provide updates from team conference, and d/c date remains 5/11. Reports [facility] location is closet to their home. He is aware SW will follow-up with his wife.   1510-SW left message for pt wife Richard Andrews to provide limited updates from team conference, focusing on DME and scheduling family edu. SW requested return phone call.   20- SW spoke with pt wife Richard Andrews to provide updates from team conference, d/c recs- TTB and outpatient therapies. Will fax to above location. Fam edu scheduled for Thursday (5/9) 1pm-4pm. SW will order TTB.   SW ordered TTB with [employer] via parachute.   Cecile Sheerer, MSW, LCSWA Office: [phone] Cell: [phone] Fax: [phone]

## 2022-10-18 NOTE — Progress Notes (Signed)
Physical Therapy Session Note  Patient Details  Name: Richard Andrews MRN: [id number] Date of Birth: 01-29-1961  Today's Date: 10/18/2022 PT Individual Time: 0902-0959 PT Individual Time Calculation (min): 57 min   Short Term Goals: Week 2:  PT Short Term Goal 1 (Week 2): STGs=LTGs secondary to ELOS  Skilled Therapeutic Interventions/Progress Updates:     Pt received seated in recliner and agrees to therapy. Pt reports slight pain in R shoulder. PT provides rest breaks as needed to manage pain. Pt stands with cues for initiation and ambulates x300' to dayroom with CGA and cues for upright gaze to improve posture and balance, and increasing gait speed to decrease risk for falls. Session initially focuses on high level gait training, with pt tasked with ambulating forward and backward x20' each. Pt noted to take 10 steps to complete forward portion, and 24 steps for backward gait. PT educates on importance of increasing step length backward to improve balance. Pt performs task several more times with gradually increasing backward stride length. Pt then participates in "push-and-release" activity to work on righting reactions. PT demonstrates task to pt and then pt performs x10 with PT providing 3 second countdown prior to releasing pt. Pt able to successfully retain balance with each trial but requires multiple steps (~4-5) to do so. Pt then completes Berg Balance test. Pt scores a 41/56 on Berg, improved from 13/56 on first attempt on 4/27. Pt ambulates x150' to main gym with CGA. Pt performs x20 total toe taps on 8" step with CGA and cues for positioning and posture. Pt then performs x10 step ups onto 8" step with minA and no handrail to challenge balance and strength. Following seated rest break, pt ambulates back to room. Left supine in bed with alarm intact and all needs within reach.   Therapy Documentation Precautions:  Precautions Precautions: Fall, Shoulder Shoulder Interventions: Shoulder  sling/immobilizer, At all times, Off for dressing/bathing/exercises Precaution Comments: OK for AROM at elbow, wrist, and digits only Required Braces or Orthoses: Sling Restrictions Weight Bearing Restrictions: Yes RUE Weight Bearing: Weight bearing as tolerated   Therapy/Group: Individual Therapy  Beau Fanny, PT, DPT 10/18/2022, 7:55 PM

## 2022-10-18 NOTE — Progress Notes (Signed)
Resting, continue monitoring sleep chart result 7.5/12 hrs. Medicated x1 for c/o right shoulder pain with pain score 7/10 after ambulating to BR this morning. Monitored and assisted, continue regime

## 2022-10-18 NOTE — Progress Notes (Signed)
Physical Therapy Session Note  Patient Details  Name: Richard Andrews MRN: [id number] Date of Birth: 02/29/1961  Today's Date: 10/18/2022 PT Individual Time: 1405-1505 PT Individual Time Calculation (min): 60 min   Short Term Goals: Week 2:  PT Short Term Goal 1 (Week 2): STGs=LTGs secondary to ELOS  Skilled Therapeutic Interventions/Progress Updates:  Patient greeted supine in bed and agreeable to PT treatment session. Patient was able to perform bed mobility with supv and min cues for not WB through R UE. While sitting EOB, patient was able to don tennis shoes with set-up assistance. Patient stood from EOB without the use of an AD and SBA. Patient tasked with locating the gift shop from the fourth floor with Min/Mod cues for using external signs, etc. Patient gait trained throughout the 4th floor on/off elevators, throughout the first floor and on various surfaces (grass, stairs, hill, mulch, bumpy bricks, carpet, threshold, concrete, carpet, etc.) outside without the use of an AD and SBA/CGA for safety. Patient required multimodal cues throughout for forward gaze, obstacle negotiation as patient tends to ambulate too closely to walls/doorways and improved B foot clearance (L>R). Patient tasked with locating his room from the first floor and required Min/ModA cues for again using external signs, etc. Patient returned to his room and left sitting upright in bedside recliner with posey belt on, call bell within reach and all needs met.   Therapy Documentation Precautions:  Precautions Precautions: Fall, Shoulder Shoulder Interventions: Shoulder sling/immobilizer, At all times, Off for dressing/bathing/exercises Precaution Comments: OK for AROM at elbow, wrist, and digits only Required Braces or Orthoses: Sling Restrictions Weight Bearing Restrictions: Yes RUE Weight Bearing: Weight bearing as tolerated  Pain: Patient denies pain.   Therapy/Group: Individual Therapy  Richard Andrews 10/18/2022, 7:50 AM

## 2022-10-18 NOTE — Progress Notes (Signed)
PROGRESS NOTE   Subjective/Complaints:  Continues to complain of pain intermittently in R shoulder, 7/10 overnight; otherwise doing well.   ROS:  +Constipation-improved/at baseline  +Insomnia-early a.m. confusion-improving/stable + Pain -improved Denies fevers, chills, CP, SOB, cough, abdominal pain, N/V/D, new/worsening paresthesias/weakness, or any other complaints at this time.     Objective:   No results found. Recent Labs    10/17/22 0608  WBC 3.2*  HGB 10.6*  HCT 31.3*  PLT 152     Recent Labs    10/17/22 0608  NA 136  K 3.5  CL 102  CO2 25  GLUCOSE 102*  BUN 10  CREATININE 0.95  CALCIUM 9.4      Intake/Output Summary (Last 24 hours) at 10/18/2022 9147 Last data filed at 10/18/2022 8295 Gross per 24 hour  Intake 934 ml  Output --  Net 934 ml         Physical Exam: Vital Signs Blood pressure 113/74, pulse 60, temperature 98.1 F (36.7 C), temperature source Oral, resp. rate 16, height 5\' 11"  (1.803 m), weight 62.7 kg, SpO2 98 %.  Constitutional: No apparent distress. Appropriate appearance for age. Sitting in gym with PT.  HENT: No JVD. Neck Supple. Trachea midline. Atraumatic, normocephalic. Eyes: PERRLA. EOMI. Visual fields grossly intact.  No appreciable nystagmus at rest. Cardiovascular: RRR, no murmurs/rub/gallops. No Edema.  Right upper extremity capillary refill brisk. Respiratory: CTAB. No rales, rhonchi, or wheezing. On RA.  Abdomen: + bowel sounds, normoactive. No distention or tenderness.  GU: Not examined. Skin: RUE surgical dressing removed Steri-Strips status post removal, incisional site appears clean/dry/intact.  - stable  MsK: R shoulder in sling.  Full active range of motion of right hand, wrist      Strength:                RUE: Grip, finger abduction 5 out of 5                LUE: 5/5 SA, 5/5 EF, 5/5 EE, 5/5 WE, 5/5 FF, 5/5 FA                 RLE: 5/5 HF, 5/5 KE, 5/5 DF,  5/5 EHL, 5/5 PF                 LLE:  5/5 HF, 5/5 KE, 5/5 DF, 5/5 EHL, 5/5 PF    Neurologic exam:  Cognition: AAO to person, place, and time.  + Lethargy, mild delay-resolved   Language: Fluent, in short phrases.   + Dysarthria and occasional  neoglisms.  Resolved + Memory deficit - improving but persistent   Insight: Poor insight into current condition.  Mood: Flat affect, appropriate mood.  Sensation: To light touch intact in BL UEs and LEs  Reflexes: 2+ in BL UE and LEs.   CN: + mild left facial droop; otherwise intact Coordination: No notable ataxia on ambulation Spasticity: MAS 0 in all extremities.  Assessment/Plan: 1. Functional deficits which require 3+ hours per day of interdisciplinary therapy in a comprehensive inpatient rehab setting. Physiatrist is providing close team supervision and 24 hour management of active medical problems listed below. Physiatrist and rehab team continue to  assess barriers to discharge/monitor patient progress toward functional and medical goals  Care Tool:  Bathing    Body parts bathed by patient: Right arm, Chest, Abdomen, Front perineal area, Right upper leg, Left upper leg, Face, Left arm, Buttocks, Right lower leg, Left lower leg   Body parts bathed by helper: Left arm, Buttocks, Right lower leg, Left lower leg     Bathing assist Assist Level: Contact Guard/Touching assist     Upper Body Dressing/Undressing Upper body dressing   What is the patient wearing?: Pull over shirt, Orthosis Orthosis activity level: Performed by helper  Upper body assist Assist Level: Minimal Assistance - Patient > 75%    Lower Body Dressing/Undressing Lower body dressing      What is the patient wearing?: Pants, Underwear/pull up     Lower body assist Assist for lower body dressing: Minimal Assistance - Patient > 75%     Toileting Toileting    Toileting assist Assist for toileting: Minimal Assistance - Patient > 75%     Transfers Chair/bed  transfer  Transfers assist     Chair/bed transfer assist level: Contact Guard/Touching assist     Locomotion Ambulation   Ambulation assist      Assist level: Contact Guard/Touching assist Assistive device: No Device Max distance: 125   Walk 10 feet activity   Assist     Assist level: Contact Guard/Touching assist Assistive device: No Device   Walk 50 feet activity   Assist    Assist level: Contact Guard/Touching assist Assistive device: No Device    Walk 150 feet activity   Assist    Assist level: Contact Guard/Touching assist Assistive device: No Device    Walk 10 feet on uneven surface  activity   Assist     Assist level: Contact Guard/Touching assist Assistive device: Other (comment) (no AD)   Wheelchair     Assist Is the patient using a wheelchair?: Yes Type of Wheelchair: Manual    Wheelchair assist level: Supervision/Verbal cueing Max wheelchair distance: 150 ft    Wheelchair 50 feet with 2 turns activity    Assist        Assist Level: Supervision/Verbal cueing   Wheelchair 150 feet activity     Assist      Assist Level: Supervision/Verbal cueing   Blood pressure 113/74, pulse 60, temperature 98.1 F (36.7 C), temperature source Oral, resp. rate 16, height 5\' 11"  (1.803 m), weight 62.7 kg, SpO2 98 %.  Medical Problem List and Plan: 1. Functional deficits secondary to traumatic SDH status post MMA embolization             -patient may shower             -ELOS/Goals: 16-18 days, Min A PT/OT/SLP - DC date 5/11  -Continue CIR  -Plan is home with brother; on STD -4/30: ADLs limited by cognitive deficits to CGA; Min A for ambulation; may be upgrading goals for stand mobility; SLP memory and awareness deficits limiting; low vocal intensity baseline? - grounds pass 5/3 a.m. for daughter's graduation; has completed family training and will be maintained at wheelchair level with min assist to CGA for transfers   2.   Antithrombotics: -DVT/anticoagulation:  Pharmaceutical: Lovenox 30 mg BID continued             -antiplatelet therapy: none   3. Pain Management: Avoid Tylenol; oxycodone 5-10mg  q4h PRN, Robaxin PRN -10/08/22 Per pharmacy, was on gabapentin outpatient; defer to primary team whether this is needing to  be restarted. Also encouraged pt to utilize PRN pain meds since he's endorsing pain but no meds have been given -10/09/22 pain improving, remembered once yesterday to ask for pain meds -4/29 utilized 1 dose of 5 mg oxycodone this morning, pain gradually improving, will restart low-dose gabapentin 100 mg 3 times daily -4-30: Pain well-controlled, monitor -5-1: Discontinue Oxy 10 mg as needed; maintain Oxy 5 mg as needed.-Well-controlled -5-3: Decrease oxy 5 mg as needed to every 8 hours 5-6: Decrease oxycodone to 5 mg every 12 hours as needed.  Will not continue oxycodone at discharge.   4. Mood/Behavior/Sleep: LCSW to evaluate and provide emotional support             -antipsychotic agents: n/a  -Trazodone PRN -10/09/22 apparently some nighttime confusion and day/night confusion, napping a lot during the day; advised avoidance of naps during the day to keep sleep schedule better; will get sleep log tonight; monitor -4-30 - 5/1: Sleep log shows sleeping 10+ hours per night.  Feeling well rested.  Likely secondary to substance withdrawal.-Sleep improved -10/15/22 sleeping improved, slept ~7hrs last night; monitor 5-6: Endorses disrupted sleep, but stable, feeling well rested.  Monitor.   5. Neuropsych/cognition: This patient is not capable of making decisions on his own behalf.   6. Skin/Wound Care: Routine skin care checks   7. Fluids/Electrolytes/Nutrition: Routine Is and Os and follow-up chemistries             -continue thiamine and folic acid supplements, and MVI             -10/08/22 Vit B12 level WNL at 865   8: Alcoholism: on Librium taper, ended prior to CIR admission; encourage  cessation -10/09/22 some overnight confusion; wonder if it's from mild withdrawal vs daytime napping? Monitor for trend/pattern for now -4/29 restart low-dose gabapentin -improved, monitor   9: History of gastric ulcers, non-bleeding: 2021 -continue Protonix 40mg  QD while hospitalized, sucralfate not restarted at this time   10: Hypertension: Monitor TID and prn; has been on lisinopril 10 mg in the past ([facility]>>he did not follow-up) but not restarted             -has been normotensive this admission  -4/29-5/5 well-controlled continue to monitor Vitals:   10/14/22 1537 10/14/22 2028 10/15/22 0527 10/15/22 1415  BP: 108/71 107/71 103/66 101/71   10/15/22 1952 10/16/22 0356 10/16/22 1451 10/16/22 2030  BP: 100/64 106/64 103/64 100/68   10/17/22 0402 10/17/22 1319 10/17/22 1931 10/18/22 0548  BP: 106/70 99/62 107/68 113/74      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 Conchita Paris   12: Right proximal humerus s/p ORIF 09/27/22              -NWB RUE, ok for AROM at elbow, wrist, and fingers -keep in sling -ordering alternate sling due to it being soiled 4-30; received 5-1             -follow-up with Dr. Dion Saucier -Per therapies, need to maintain in sling at all times to remind of nonweightbearing status; otherwise is impulsive   13: Macrocytic anemia: Hgb stable at 10.8 on 10/08/22, monitor routine labs; B12 level WNL as above   14: Thrombocytopenia: improved on 10/08/22 labs, monitor on routine labs  15: Constipation: no BM in several days, or longer (?1wk) -10/08/22 still no BM, will give Sorbitol 60ml x1 now, monitor for effect;  continue miralax 17g BID, senokot S 1 tab BID, and PRNs -10/09/22 pt states BM this AM, not well documented; monitor, asked nursing to please try to document Bms -10/15/22 LBM 2 days ago, normal for him, monitor for now but may need to change regimen if no BM in next day or so  -10/16/22 LBM yesterday,  cont regimen   16: Hypokalemia  -4/29 will order 30 mEq potassium K-Dur -resolved 5-1, potassium 3.8.   17: Urinary incontinence.  Generally nocturia, patient denies.  Reinstate PVRs -10/15/22 no further incontinence last night and no PVRs recorded but bladder scans show 0, so hopefully this has resolved; monitor -10/16/22 low PVRs, monitor but seems to be improving 5-6: Remain low, monitor    LOS: 11 days A FACE TO FACE EVALUATION WAS PERFORMED  Richard Andrews 10/18/2022, 9:03 AM

## 2022-10-19 NOTE — Progress Notes (Signed)
PROGRESS NOTE   Subjective/Complaints:  No acute complaints. No events overnight. No further incontinence overnight.   Doing well.   ROS:  +Insomnia-early a.m. confusion-stable + Pain -improved Denies fevers, chills, CP, SOB, cough, abdominal pain, N/V/D, new/worsening paresthesias/weakness, or any other complaints at this time.     Objective:   No results found. Recent Labs    10/17/22 0608  WBC 3.2*  HGB 10.6*  HCT 31.3*  PLT 152     Recent Labs    10/17/22 0608  NA 136  K 3.5  CL 102  CO2 25  GLUCOSE 102*  BUN 10  CREATININE 0.95  CALCIUM 9.4      Intake/Output Summary (Last 24 hours) at 10/19/2022 0842 Last data filed at 10/19/2022 0726 Gross per 24 hour  Intake 928 ml  Output 200 ml  Net 728 ml         Physical Exam: Vital Signs Blood pressure 103/72, pulse 66, temperature 98.9 F (37.2 C), temperature source Oral, resp. rate 16, height 5\' 11"  (1.803 m), weight 62.7 kg, SpO2 98 %.  Constitutional: No apparent distress. Appropriate appearance for age. Walking with PT.  HENT: No JVD. Neck Supple. Trachea midline. Atraumatic, normocephalic. Eyes: PERRLA. No appreciable nystagmus at rest. Cardiovascular: RRR, no murmurs/rub/gallops. No Edema.  Right upper extremity capillary refill brisk - unchanged Respiratory: CTAB. No rales, rhonchi, or wheezing. On RA.  Abdomen: + bowel sounds, normoactive. No distention or tenderness.  GU: Not examined. Skin: RUE incisional site appears clean/dry/intact.  - stable  MsK: R shoulder in sling.  Full active range of motion of right hand, wrist      Strength:                RUE: Grip, finger abduction 5 out of 5                LUE: 5/5 SA, 5/5 EF, 5/5 EE, 5/5 WE, 5/5 FF, 5/5 FA                 RLE: 5/5 HF, 5/5 KE, 5/5 DF, 5/5 EHL, 5/5 PF                 LLE:  5/5 HF, 5/5 KE, 5/5 DF, 5/5 EHL, 5/5 PF    Neurologic exam:  Cognition: AAO to person, place, and  time.  +  mild delay-resolved + Memory deficit - improving but persistent; likely baseline   Insight: Poor insight into current condition.  Mood: Flat affect, appropriate mood.  Sensation: To light touch intact in BL UEs and LEs  Reflexes: 2+ in BL UE and LEs.   CN: + mild left facial droop; otherwise intact Coordination: No notable ataxia on ambulation Spasticity: MAS 0 in all extremities.  Assessment/Plan: 1. Functional deficits which require 3+ hours per day of interdisciplinary therapy in a comprehensive inpatient rehab setting. Physiatrist is providing close team supervision and 24 hour management of active medical problems listed below. Physiatrist and rehab team continue to assess barriers to discharge/monitor patient progress toward functional and medical goals  Care Tool:  Bathing    Body parts bathed by patient: Right arm, Chest, Abdomen, Front perineal  area, Right upper leg, Left upper leg, Face, Left arm, Buttocks, Right lower leg, Left lower leg   Body parts bathed by helper: Left arm, Buttocks, Right lower leg, Left lower leg     Bathing assist Assist Level: Contact Guard/Touching assist     Upper Body Dressing/Undressing Upper body dressing   What is the patient wearing?: Pull over shirt, Orthosis Orthosis activity level: Performed by helper  Upper body assist Assist Level: Minimal Assistance - Patient > 75%    Lower Body Dressing/Undressing Lower body dressing      What is the patient wearing?: Pants, Underwear/pull up     Lower body assist Assist for lower body dressing: Minimal Assistance - Patient > 75%     Toileting Toileting    Toileting assist Assist for toileting: Minimal Assistance - Patient > 75%     Transfers Chair/bed transfer  Transfers assist     Chair/bed transfer assist level: Contact Guard/Touching assist     Locomotion Ambulation   Ambulation assist      Assist level: Contact Guard/Touching assist Assistive device:  No Device Max distance: 125   Walk 10 feet activity   Assist     Assist level: Contact Guard/Touching assist Assistive device: No Device   Walk 50 feet activity   Assist    Assist level: Contact Guard/Touching assist Assistive device: No Device    Walk 150 feet activity   Assist    Assist level: Contact Guard/Touching assist Assistive device: No Device    Walk 10 feet on uneven surface  activity   Assist     Assist level: Contact Guard/Touching assist Assistive device: Other (comment) (no AD)   Wheelchair     Assist Is the patient using a wheelchair?: Yes Type of Wheelchair: Manual    Wheelchair assist level: Supervision/Verbal cueing Max wheelchair distance: 150 ft    Wheelchair 50 feet with 2 turns activity    Assist        Assist Level: Supervision/Verbal cueing   Wheelchair 150 feet activity     Assist      Assist Level: Supervision/Verbal cueing   Blood pressure 103/72, pulse 66, temperature 98.9 F (37.2 C), temperature source Oral, resp. rate 16, height 5\' 11"  (1.803 m), weight 62.7 kg, SpO2 98 %.  Medical Problem List and Plan: 1. Functional deficits secondary to traumatic SDH status post MMA embolization             -patient may shower             -ELOS/Goals: 16-18 days, Min A PT/OT/SLP - DC date 5/11  -Continue CIR  -Plan is home with brother; on STD -4/30: ADLs limited by cognitive deficits to CGA; Min A for ambulation; may be upgrading goals for stand mobility; SLP memory and awareness deficits limiting; low vocal intensity baseline? - grounds pass 5/3 a.m. for daughter's graduation; has completed family training and will be maintained at wheelchair level with min assist to CGA for transfers   2.  Antithrombotics: -DVT/anticoagulation:  Pharmaceutical: Lovenox 30 mg BID continued             -antiplatelet therapy: none   3. Pain Management: Avoid Tylenol; oxycodone 5-10mg  q4h PRN, Robaxin PRN -10/08/22 Per  pharmacy, was on gabapentin outpatient; defer to primary team whether this is needing to be restarted. Also encouraged pt to utilize PRN pain meds since he's endorsing pain but no meds have been given -10/09/22 pain improving, remembered once yesterday to ask for pain  meds -4/29 utilized 1 dose of 5 mg oxycodone this morning, pain gradually improving, will restart low-dose gabapentin 100 mg 3 times daily -4-30: Pain well-controlled, monitor -5-1: Discontinue Oxy 10 mg as needed; maintain Oxy 5 mg as needed.-Well-controlled -5-3: Decrease oxy 5 mg as needed to every 8 hours 5-6: Decrease oxycodone to 5 mg every 12 hours as needed.  Will not continue oxycodone at discharge.   4. Mood/Behavior/Sleep: LCSW to evaluate and provide emotional support             -antipsychotic agents: n/a  -Trazodone PRN -10/09/22 apparently some nighttime confusion and day/night confusion, napping a lot during the day; advised avoidance of naps during the day to keep sleep schedule better; will get sleep log tonight; monitor -4-30 - 5/1: Sleep log shows sleeping 10+ hours per night.  Feeling well rested.  Likely secondary to substance withdrawal.-Sleep improved -10/15/22 sleeping improved, slept ~7hrs last night; monitor 5-6: Endorses disrupted sleep, but stable, feeling well rested.  Monitor.   5. Neuropsych/cognition: This patient is not capable of making decisions on his own behalf.   6. Skin/Wound Care: Routine skin care checks   7. Fluids/Electrolytes/Nutrition: Routine Is and Os and follow-up chemistries             -continue thiamine and folic acid supplements, and MVI             -10/08/22 Vit B12 level WNL at 865   8: Alcoholism: on Librium taper, ended prior to CIR admission; encourage cessation -10/09/22 some overnight confusion; wonder if it's from mild withdrawal vs daytime napping? Monitor for trend/pattern for now -4/29 restart low-dose gabapentin -improved, monitor   9: History of gastric ulcers,  non-bleeding: 2021 -continue Protonix 40mg  QD while hospitalized, sucralfate not restarted at this time   10: Hypertension: Monitor TID and prn; has been on lisinopril 10 mg in the past ([facility] >>he did not follow-up) but not restarted             -has been normotensive this admission  -4/29-5/8 well-controlled continue to monitor Vitals:   10/15/22 0527 10/15/22 1415 10/15/22 1952 10/16/22 0356  BP: 103/66 101/71 100/64 106/64   10/16/22 1451 10/16/22 2030 10/17/22 0402 10/17/22 1319  BP: 103/64 100/68 106/70 99/62   10/17/22 1931 10/18/22 0548 10/18/22 1351 10/18/22 2101  BP: 107/68 113/74 100/73 103/72      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 Conchita Paris   12: Right proximal humerus s/p ORIF 09/27/22              -NWB RUE, ok for AROM at elbow, wrist, and fingers -keep in sling -ordering alternate sling due to it being soiled 4-30; received 5-1             -follow-up with Dr. Dion Saucier -Per therapies, need to maintain in sling at all times to remind of nonweightbearing status; otherwise is impulsive   13: Macrocytic anemia: Hgb stable at 10.8 on 10/08/22, monitor routine labs; B12 level WNL as above   14: Thrombocytopenia: improved on 10/08/22 labs, monitor on routine labs  15: Constipation: no BM in several days, or longer (?1wk) -10/08/22 still no BM, will give Sorbitol 60ml x1 now, monitor for effect; continue miralax 17g BID, senokot S 1 tab BID, and PRNs -10/09/22 pt states BM this AM, not well documented; monitor, asked nursing to please try to document Bms -10/15/22  LBM 2 days ago, normal for him, monitor for now but may need to change regimen if no BM in next day or so  -10/18/22 LBM     16: Hypokalemia  -4/29 will order 30 mEq potassium K-Dur -resolved 5-1, potassium 3.8.   17: Urinary incontinence.  Generally nocturia, patient denies.  Reinstate PVRs -10/15/22 no further incontinence last night and no PVRs  recorded but bladder scans show 0, so hopefully this has resolved; monitor -10/16/22 low PVRs, monitor but seems to be improving 5-6: Remain low, monitor 5/8: can DC PVRs    LOS: 12 days A FACE TO FACE EVALUATION WAS PERFORMED  Richard Andrews 10/19/2022, 8:42 AM

## 2022-10-19 NOTE — Progress Notes (Signed)
Patient ID: Richard Andrews, male   DOB: Sep 17, 1960, 62 y.o.   MRN: [id number]  SW faxed outpatient referral to [facility] ([phone]/f:[phone]).  Cecile Sheerer, MSW, LCSWA Office: [phone] Cell: [phone] Fax: [phone]

## 2022-10-19 NOTE — Progress Notes (Signed)
Speech Language Pathology TBI Note  Patient Details  Name: Richard Andrews MRN: [id number] Date of Birth: 25-Aug-1960  Today's Date: 10/19/2022 SLP Individual Time: 4098-1191 SLP Individual Time Calculation (min): 45 min  Short Term Goals: Week 2: SLP Short Term Goal 1 (Week 2): Pt will recall functional, novel information (weight bearing precautions, etc) with 50% accuracy provided mod A verbal cues SLP Short Term Goal 2 (Week 2): Pt will ID 2 physical and 2 cognitive changes s/p fall/hospitalization with mod A cues SLP Short Term Goal 3 (Week 2): Patient will utilize speech intelligibility strategies at the conversation level with mod multimodal cues to achieve 90% intelligibility. SLP Short Term Goal 4 (Week 2): Patient will demonstrate sustained attention to functional tasks for 20 mintues with Mod verbal cues for redirection. SLP Short Term Goal 5 (Week 2): Patient will increase error awareness and repair during structured and functional tasks utilizing strategies as trained with min verbal and visual A  Skilled Therapeutic Interventions:  Pt was seen in am to address cognitive re- training and speech intelligibility. Pt was alert and seated upright in recliner upon SLP arrival. Pt was agreeable for session. Pt oriented to temporal and spatial concepts with sup A and use of external visual aid. SLP challenging pt in higher level problem solving of medication, financial, and daily living scenarios. Given scenarios presented verbally, pt was challenged to identify a solution. Pt verbalized an accurate solution in 73% of opportunities. Pt was occasionally limited by vague response. In missed opportunities SLP provided correct response and rationale. He continues to present with limited insight into condition and poor safety awareness as indicated by responding that he would double up on medication if he missed several days. In additional minutes of session, SLP challenged pt in speech intelligibility.  Pt identified speech intelligibility strategies utilizing external visual aid. SLP guided pt in structured conversational exchange where pt warranted sup to min A to improve vocal intensity and over articulation with good return demo. Pt was left seated upright in recliner with chair alarm active and call button within reach. SLP to continue POC.  Pain Pain Assessment Pain Scale: 0-10 Pain Score: 7  Pain Type: Acute pain Pain Location: Shoulder Pain Orientation: Right Pain Descriptors / Indicators: Aching Pain Frequency: Constant Pain Onset: On-going Pain Intervention(s): Medication (See eMAR)  Agitated Behavior Scale: TBI Observation Details Observation Environment: CIR Start of observation period - Date: 10/19/22 Start of observation period - Time: 1030 End of observation period - Date: 10/19/22 End of observation period - Time: 1115 Agitated Behavior Scale (DO NOT LEAVE BLANKS) Short attention span, easy distractibility, inability to concentrate: Absent Impulsive, impatient, low tolerance for pain or frustration: Absent Uncooperative, resistant to care, demanding: Absent Violent and/or threatening violence toward people or property: Absent Explosive and/or unpredictable anger: Absent Rocking, rubbing, moaning, or other self-stimulating behavior: Absent Pulling at tubes, restraints, etc.: Absent Wandering from treatment areas: Absent Restlessness, pacing, excessive movement: Absent Repetitive behaviors, motor, and/or verbal: Absent Rapid, loud, or excessive talking: Absent Sudden changes of mood: Absent Easily initiated or excessive crying and/or laughter: Absent Self-abusiveness, physical and/or verbal: Absent Agitated behavior scale total score: 14  Therapy/Group: Individual Therapy  Renaee Munda 10/19/2022, 11:45 AM

## 2022-10-19 NOTE — Progress Notes (Signed)
Physical Therapy Session Note  Patient Details  Name: Richard Andrews MRN: [id number] Date of Birth: 12/30/1960  Today's Date: 10/19/2022 PT Individual Time: 0916-1015 PT Individual Time Calculation (min): 59 min   Short Term Goals: Week 2:  PT Short Term Goal 1 (Week 2): STGs=LTGs secondary to ELOS  Skilled Therapeutic Interventions/Progress Updates:  Patient greeted supine in bed and agreeable to PT treatment session. Patient transitioned to sitting EOB with supv- Minor VC for not WB through R UE. Upon sitting EOB, patient was able to don tennis shoes and then required MinA for managing R UE sling. Patient stood from EOB with Supv and requested to use the restroom prior to starting treatment session. Patient ambulated to/from the bathroom without the use of an AD and SBA. Patient stood at the toilet with continent void. Patient gait trained to/from his room and main rehab gym without AD and SBA.   Patient stood without UE support while performing alternating foot taps to different colored cones, x10 on each LE with good stability noted and able to complete without knocking a single cone over.   Patient stood at 6" steps with S HR and performed various step-ups in order to increased LE strength, stability and overall foot placement for improved functional mobility- -Fwd step-ups R LE leading, x10  -Fwd step-ups L LE leading, x10  -Lateral step-ups R LE leading, x10  -Lateral ste-ups L LE leading, x10   Patient gait trained x150+ feet without the use of an AD and Supv for safety- Patient then ambulated up/down a low grade ramp, over mulch and then stepped down a 6" step without UE support and CGA for safety. Throughout all gait trials, patient demonstrated improved B foot clearance and overall stability.   Patient performed higher-level gait activities in order to improved overall stability and independence with functional mobility- -Stepping forward over 9" hurdles with L LE leading -Stepping  forward over 9" hurdles with R LE leading -Stepping laterally over 9" hurdles with L LE leading -Stepping laterally over 9" hurdles with R LE leading  Patient was able to complete all hurdle mobility without knocking over a single hurdle, patient was 5/5 with each activity.   -Forward marches, 2 x 20'  -Retro gait, 2 x 20'  -Forward gait with lateral head turns, 4 x 20'  -Side-stepping, x20' in each direction  Patient performed all the above with SBA for safety with good stability noted and no significant LOB.   Patient returned to his room and left sitting in bedside recliner with posey belt on, call bell within reach and all needs met.    Therapy Documentation Precautions:  Precautions Precautions: Fall, Shoulder Shoulder Interventions: Shoulder sling/immobilizer, At all times, Off for dressing/bathing/exercises Precaution Comments: OK for AROM at elbow, wrist, and digits only Required Braces or Orthoses: Sling Restrictions Weight Bearing Restrictions: Yes RUE Weight Bearing: Non weight bearing  Pain: Denies pain.    Therapy/Group: Individual Therapy  [name] 10/19/2022, 7:51 AM

## 2022-10-19 NOTE — Progress Notes (Signed)
Physical Therapy TBI Note  Patient Details  Name: Richard Andrews MRN: [id number] Date of Birth: 1960-11-22  Today's Date: 10/19/2022 PT Individual Time: 4132-4401 PT Individual Time Calculation (min): 42 min   Short Term Goals: Week 2:  PT Short Term Goal 1 (Week 2): STGs=LTGs secondary to ELOS  Skilled Therapeutic Interventions/Progress Updates:      Pt presents in room handoff from OT. Session focused on gait training and NMR for BLE coordination and single limb stability with upright mobility. Pt completes transfers with close supervision throughout session.   Pt requests to use bathroom at start of session, ambulates to toilet and completes urination in standing continent of void (charted). Pt with verbal cues for hand hygiene completed at sink in standing. Pt then ambulates to day room with close/distant supervision ~200'.  Pt completes grapevine 2x25' to R and L, max verbal cues for sequencing with crossing midline going to L however progresses to no verbal cues with going to R. Completes third trial focused on grapevine to L side with pt continueing to requiring verbal and visual cues for sequencing crossover step with RLE over LLE.  Pt completes NMR for part practice crossover step 2x10 RLE in front of LLE and 2x10 RLE behind LLE to promote improved attendance to L side and bringing RLE across midline for BLE coordination. Completes crossover cone taps in front x10 BLE (demo improved sequencing with LLE crossing over RLE, pt with max verbal cues for maintaining hips and feet facing forward as pt begins to rotate towards cone with each reptition crossing RLE over LLE) and behind x5 BLE. Pt requires verbal cues for looking at cone with crossing behind, CGA for stability for all crossover tasks.  Pt provided with rest breaks between all gait and NMR trials for energy conservation and to improve quality with tasks.  Pt ambulates from day room back to room with close/distant supervision and  returns to supine with distant supervision. Pt remains supine with all needs within reach, call light in place at end of session.  Therapy Documentation Precautions:  Precautions Precautions: Fall, Shoulder Shoulder Interventions: Shoulder sling/immobilizer, At all times, Off for dressing/bathing/exercises Precaution Comments: OK for AROM at elbow, wrist, and digits only Required Braces or Orthoses: Sling Restrictions Weight Bearing Restrictions: Yes RUE Weight Bearing: Non weight bearing   Agitated Behavior Scale: TBI Observation Details Observation Environment: CIR Start of observation period - Date: 10/19/22 Start of observation period - Time: 1402 End of observation period - Date: 10/19/22 End of observation period - Time: 1445 Agitated Behavior Scale (DO NOT LEAVE BLANKS) Short attention span, easy distractibility, inability to concentrate: Absent Impulsive, impatient, low tolerance for pain or frustration: Absent Uncooperative, resistant to care, demanding: Absent Violent and/or threatening violence toward people or property: Absent Explosive and/or unpredictable anger: Absent Rocking, rubbing, moaning, or other self-stimulating behavior: Absent Pulling at tubes, restraints, etc.: Absent Wandering from treatment areas: Absent Restlessness, pacing, excessive movement: Absent Repetitive behaviors, motor, and/or verbal: Absent Rapid, loud, or excessive talking: Absent Sudden changes of mood: Absent Easily initiated or excessive crying and/or laughter: Absent Self-abusiveness, physical and/or verbal: Absent Agitated behavior scale total score: 14   Therapy/Group: Individual Therapy  Edwin Cap PT, DPT 10/19/2022, 3:48 PM

## 2022-10-19 NOTE — Progress Notes (Addendum)
Occupational Therapy TBI Note  Patient Details  Name: MATEEN VANZEELAND MRN: [id number] Date of Birth: 12/27/1960  Today's Date: 10/19/2022 OT Individual Time: 1300-1400 OT Individual Time Calculation (min): 60 min    Short Term Goals:  Week 2:  OT Short Term Goal 1 (Week 2): LTG=STG 2/2 ELOS   Skilled Therapeutic Interventions/Progress Updates:    Pt sitting EOB finishing up lunch and needing assist to take lid off of ice cream.  Sit to stand and ambulation of approximately 200 feet without AD and with close supervision and intermittent CGA in hallway when busy environment to attend to obstacles on left side.  Pt completed nustep using LUE and BLE resistance level 6 x 10 minutes for gross endurance and strengthening.  Pt ambulated again 200 feet with same assist level as previous mentioned and participated in seated tabletop working memory task of moderate complexity to spot differences between two images and instructed in compensatory strategy of keeping a list of noted items.  Pt required max assist and cues to identify differences.  Pt ambulated back to room with close supervision and cues to find room due to turning down incorrect hall initially. Direct hand off to PT.     Therapy Documentation Precautions:  Precautions Precautions: Fall, Shoulder Shoulder Interventions: Shoulder sling/immobilizer, At all times, Off for dressing/bathing/exercises Precaution Comments: OK for AROM at elbow, wrist, and digits only Required Braces or Orthoses: Sling Restrictions Weight Bearing Restrictions: Yes RUE Weight Bearing: Non weight bearing   Therapy/Group: Individual Therapy  Amie Critchley 10/19/2022, 3:53 PM

## 2022-10-20 MED ORDER — OXYCODONE HCL 5 MG PO TABS
2.5000 mg | ORAL_TABLET | Freq: Two times a day (BID) | ORAL | Status: DC | PRN
  Administered 2022-10-21: 2.5 mg via ORAL
  Administered 2022-10-21 – 2022-10-22 (×2): 5 mg via ORAL
  Filled 2022-10-20 (×3): qty 1

## 2022-10-20 MED ORDER — POLYETHYLENE GLYCOL 3350 17 G PO PACK
17.0000 g | PACK | Freq: Every day | ORAL | Status: DC
  Administered 2022-10-21: 17 g via ORAL
  Filled 2022-10-20 (×2): qty 1

## 2022-10-20 NOTE — Progress Notes (Signed)
Occupational Therapy Session Note  Patient Details  Name: Richard Andrews MRN: [id number] Date of Birth: July 05, 1960  Today's Date: 10/20/2022 OT Individual Time: 1417-1500 OT Individual Time Calculation (min): 43 min    Short Term Goals: Week 2:  OT Short Term Goal 1 (Week 2): LTG=STG 2/2 ELOS  Skilled Therapeutic Interventions/Progress Updates:    Pt greeted seated in recliner with family present for family education. Education provided regarding safe BADL performance in home environment, home modifications, DME needs, energy conservation techniques, and safety awareness.  Pt ambulated to therapy apartment with supervision and we practiced tub bench transfers in simulated home environment. Pt and family member demonstrated understanding. OT educated on donning/doffing sling, and ROM restrictions to  RUE. Standing balance and L UE strengthening standing on foam mat with curl to press 2/ 4 lb weight. OT provided handout on home fine motor program and theraputty exercises. PT ambulated back to room in similar fashion and left seated in wc with family present and needs met.   Therapy Documentation Precautions:  Precautions Precautions: Fall, Shoulder Shoulder Interventions: Shoulder sling/immobilizer, At all times, Off for dressing/bathing/exercises Precaution Comments: OK for AROM at elbow, wrist, and digits only Required Braces or Orthoses: Sling Restrictions Weight Bearing Restrictions: Yes RUE Weight Bearing: Non weight bearing Pain:  Denies pain   Therapy/Group: Individual Therapy  [name] 10/20/2022, 2:10 PM

## 2022-10-20 NOTE — Progress Notes (Signed)
Physical Therapy Session Note  Patient Details  Name: Richard Andrews MRN: [id number] Date of Birth: [birth year]  Today's Date: 10/20/2022 PT Individual Time: 4098-1191 and 4782-9562 PT Individual Time Calculation (min): 70 min and 40 min  Short Term Goals: Week 2:  PT Short Term Goal 1 (Week 2): STGs=LTGs secondary to ELOS  Skilled Therapeutic Interventions/Progress Updates:     1st Session: Pt received supine in bed and agrees to therapy. Reports pain in R shoulder. PT provides rest breaks as needed to manage pain. Pt performs bed mobility without assistance. Sit to stand with cues for initiation. Pt ambulates x300' with close supervision and cues to attend to L visual field and navigate obstacles in crowded environment. PT educates pt on 6 minute walk test and pt agreeable to perform. Pt ambulates 1239', up from 264' on previous attempt. Pt takes seated rest break.   Pt completes Nustep for endurance training. Pt completes x11:00 at workload of 6 with average step per minute ~55. Pt completes with L upper extremity and both legs, with cues from PT on hand and foot placement and completing full available ROM.   Pt tasked with ambulating while naming items from the supermarket with each letter of the alphabet to challenge cognition and provide multitasking challenge. Pt notably decreases gait speed when performing task, and requires mod cueing for assistance to accurately name items, though remains on task without cueing required. Following rest break, pt ambulates back to room with same cues. Left seated in recliner with alarm intact and all needs within reach.  2nd Session: Pt received seated at EOB and family present for family education. Pt does not complain of pain. PT provides education regarding pt's functional status, progress with therapy, and recommendations for safe mobility following discharge  Pt ambulates throughout session with supervision and cues for navigation and attending to L  visual field. Pt completes car transfer with cues for safe sequencing. Pt completes ramp navigation and ambulates to main gym. Following demonstration, pt completes x12 steps without handrail ascending and with L handrail descending. PT provides CGA and cues for safe step sequencing.  Pt performs Wii bowling activity to challenge coordination, dynamic standing balance, and incorporation of visual cues into complex task. Pt able to stand and perform all movements with minimal cueing and no LOBs.   Pt left seated in recliner with all needs within reach.    Therapy Documentation Precautions:  Precautions Precautions: Fall, Shoulder Shoulder Interventions: Shoulder sling/immobilizer, At all times, Off for dressing/bathing/exercises Precaution Comments: OK for AROM at elbow, wrist, and digits only Required Braces or Orthoses: Sling Restrictions Weight Bearing Restrictions: Yes RUE Weight Bearing: Non weight bearing   Therapy/Group: Individual Therapy  Beau Fanny, Pt, DPT 10/20/2022, 5:27 PM

## 2022-10-20 NOTE — Progress Notes (Addendum)
Speech Language Pathology TBI Daily Session Note  Patient Details  Name: Richard Andrews MRN: [id number] Date of Birth: Feb 12, 1961  Today's Date: 10/20/2022 SLP Individual Time: 1328-1410 SLP Individual Time Calculation (min): 42 min  Short Term Goals: Week 2: SLP Short Term Goal 1 (Week 2): Pt will recall functional, novel information (weight bearing precautions, etc) with 50% accuracy provided mod A verbal cues SLP Short Term Goal 2 (Week 2): Pt will ID 2 physical and 2 cognitive changes s/p fall/hospitalization with mod A cues SLP Short Term Goal 3 (Week 2): Patient will utilize speech intelligibility strategies at the conversation level with mod multimodal cues to achieve 90% intelligibility. SLP Short Term Goal 4 (Week 2): Patient will demonstrate sustained attention to functional tasks for 20 mintues with Mod verbal cues for redirection. SLP Short Term Goal 5 (Week 2): Patient will increase error awareness and repair during structured and functional tasks utilizing strategies as trained with min verbal and visual A  Skilled Therapeutic Interventions: Skilled treatment session focused on completion of education with the patient and his family. SLP facilitated session by providing education regarding patient's current cognitive functioning and strategies to utilize to maximize attention, problem solving, recall and overall safety with functional and familiar tasks. SLP reinforced the importance of 24 hour supervision to ensure safe decision making as well as the importance of refraining for alcohol, driving and returning to work immediately post discharge. SLP also discussed several activities/tasks he can participate in at home to stay both physically and cognitively engaged. All verbalized understanding and handouts were given to reinforce education. Patient left upright in bed with alarm on and all needs within reach. Continue with current plan of care.    ABS discontinued d/t ABS score less  than 20 for the last three days or no behaviors present   Pain No/Denies Pain   Therapy/Group: Individual Therapy  Britten Parady 10/20/2022, 3:05 PM

## 2022-10-20 NOTE — Progress Notes (Signed)
PROGRESS NOTE   Subjective/Complaints:  No acute complaints. No events overnight.  Working with PT this a.m., feeling good. LBM 5/8. No incontinence overnight. Intermittent pain level 7; using oxycodone 5 mg once daily  Discussed with patient discharge planning regarding current medication gabapentin.  Emphasized that he will be unable to take this with alcohol, along with narcotic pain medications.  Discussed with patient if he intends to continue significant alcohol use on discharge, these medications would be unsafe.  He states "that is a loaded question", wishes some time to think about it, will readdress in AM.  ROS:  +Insomnia-resolved + Pain -improved Denies fevers, chills, CP, SOB, cough, abdominal pain, N/V/D, new/worsening paresthesias/weakness, or any other complaints at this time.     Objective:   No results found. No results for input(s): "WBC", "HGB", "HCT", "PLT" in the last 72 hours.   No results for input(s): "NA", "K", "CL", "CO2", "GLUCOSE", "BUN", "CREATININE", "CALCIUM" in the last 72 hours.    Intake/Output Summary (Last 24 hours) at 10/20/2022 0913 Last data filed at 10/20/2022 0747 Gross per 24 hour  Intake 714 ml  Output 1050 ml  Net -336 ml         Physical Exam: Vital Signs Blood pressure 103/70, pulse 62, temperature 99.4 F (37.4 C), temperature source Oral, resp. rate 20, height 5\' 11"  (1.803 m), weight 62.7 kg, SpO2 96 %.  Constitutional: No apparent distress. Appropriate appearance for age.  Working in gym on standing bicycle. HENT: No JVD. Neck Supple. Trachea midline. Atraumatic, normocephalic. Eyes: PERRLA. No appreciable nystagmus at rest. Cardiovascular: RRR, no murmurs/rub/gallops. No Edema.  Right upper extremity capillary refill brisk - unchanged Respiratory: CTAB. No rales, rhonchi, or wheezing. On RA.  Abdomen: + bowel sounds, normoactive. No distention or tenderness.  GU:  Not examined. Skin: RUE incisional site appears clean/dry/intact.  - stable  MsK: R shoulder in sling.  Full active range of motion of right hand, wrist      Strength: Bilateral lower and left upper extremities 5 out of 5, throughout                RUE: Grip, finger abduction 5 out of 5    Neurologic exam:  Cognition: AAO to person, place, and time.  + Memory deficit - improving but persistent; likely baseline   Insight: Improved insight into current condition; likely has capacity.  Mood: Flat affect, appropriate mood.  Sensation: To light touch intact in BL UEs and LEs  Reflexes: 2+ in BL UE and LEs.   CN: + mild left facial droop; otherwise intact Coordination: No notable ataxia on ambulation Spasticity: MAS 0 in all extremities.  Assessment/Plan: 1. Functional deficits which require 3+ hours per day of interdisciplinary therapy in a comprehensive inpatient rehab setting. Physiatrist is providing close team supervision and 24 hour management of active medical problems listed below. Physiatrist and rehab team continue to assess barriers to discharge/monitor patient progress toward functional and medical goals  Care Tool:  Bathing    Body parts bathed by patient: Right arm, Chest, Abdomen, Front perineal area, Right upper leg, Left upper leg, Face, Left arm, Buttocks, Right lower leg, Left lower leg  Body parts bathed by helper: Left arm, Buttocks, Right lower leg, Left lower leg     Bathing assist Assist Level: Contact Guard/Touching assist     Upper Body Dressing/Undressing Upper body dressing   What is the patient wearing?: Pull over shirt, Orthosis Orthosis activity level: Performed by helper  Upper body assist Assist Level: Minimal Assistance - Patient > 75%    Lower Body Dressing/Undressing Lower body dressing      What is the patient wearing?: Pants, Underwear/pull up     Lower body assist Assist for lower body dressing: Minimal Assistance - Patient > 75%      Toileting Toileting    Toileting assist Assist for toileting: Minimal Assistance - Patient > 75%     Transfers Chair/bed transfer  Transfers assist     Chair/bed transfer assist level: Contact Guard/Touching assist     Locomotion Ambulation   Ambulation assist      Assist level: Contact Guard/Touching assist Assistive device: No Device Max distance: 125   Walk 10 feet activity   Assist     Assist level: Contact Guard/Touching assist Assistive device: No Device   Walk 50 feet activity   Assist    Assist level: Contact Guard/Touching assist Assistive device: No Device    Walk 150 feet activity   Assist    Assist level: Contact Guard/Touching assist Assistive device: No Device    Walk 10 feet on uneven surface  activity   Assist     Assist level: Contact Guard/Touching assist Assistive device: Other (comment) (no AD)   Wheelchair     Assist Is the patient using a wheelchair?: Yes Type of Wheelchair: Manual    Wheelchair assist level: Supervision/Verbal cueing Max wheelchair distance: 150 ft    Wheelchair 50 feet with 2 turns activity    Assist        Assist Level: Supervision/Verbal cueing   Wheelchair 150 feet activity     Assist      Assist Level: Supervision/Verbal cueing   Blood pressure 103/70, pulse 62, temperature 99.4 F (37.4 C), temperature source Oral, resp. rate 20, height 5\' 11"  (1.803 m), weight 62.7 kg, SpO2 96 %.  Medical Problem List and Plan: 1. Functional deficits secondary to traumatic SDH status post MMA embolization             -patient may shower             -ELOS/Goals: 16-18 days, Min A PT/OT/SLP - DC date 5/11  -Continue CIR  -Plan is home with brother; on STD -4/30: ADLs limited by cognitive deficits to CGA; Min A for ambulation; may be upgrading goals for stand mobility; SLP memory and awareness deficits limiting; low vocal intensity baseline? - grounds pass 5/3 a.m. for daughter's  graduation; has completed family training and will be maintained at wheelchair level with min assist to CGA for transfers   2.  Antithrombotics: -DVT/anticoagulation:  Pharmaceutical: Lovenox 30 mg BID continued             -antiplatelet therapy: none   3. Pain Management: Avoid Tylenol; oxycodone 5-10mg  q4h PRN, Robaxin PRN -10/08/22 Per pharmacy, was on gabapentin outpatient; defer to primary team whether this is needing to be restarted. Also encouraged pt to utilize PRN pain meds since he's endorsing pain but no meds have been given -10/09/22 pain improving, remembered once yesterday to ask for pain meds -4/29 utilized 1 dose of 5 mg oxycodone this morning, pain gradually improving, will restart low-dose gabapentin 100  mg 3 times daily -4-30: Pain well-controlled, monitor -5-1: Discontinue Oxy 10 mg as needed; maintain Oxy 5 mg as needed.-Well-controlled -5-3: Decrease oxy 5 mg as needed to every 8 hours 5-6: Decrease oxycodone to 5 mg every 12 hours as needed.  Will not continue oxycodone at discharge. 5/9: Change oxycodone to 2.5-5 mg BID PRN. DC of gabapentin if patient intends to resume alcohol consumption on discharge; will readdress with him in a.m.   4. Mood/Behavior/Sleep: LCSW to evaluate and provide emotional support             -antipsychotic agents: n/a  -Trazodone PRN -10/09/22 apparently some nighttime confusion and day/night confusion, napping a lot during the day; advised avoidance of naps during the day to keep sleep schedule better; will get sleep log tonight; monitor -4-30 - 5/1: Sleep log shows sleeping 10+ hours per night.  Feeling well rested.  Likely secondary to substance withdrawal.-Sleep improved -10/15/22 sleeping improved, slept ~7hrs last night; monitor 5-6: Endorses disrupted sleep, but stable, feeling well rested.  Monitor.   5. Neuropsych/cognition: This patient is capable of making decisions on his own behalf.  5-9: Significant cognitive improvement since  admission.  Still some mild memory deficits, poor carryover, however these appear baseline.  Is able to reason for complex problems and is fully oriented.  At this point, would say he has regained capacity.   6. Skin/Wound Care: Routine skin care checks   7. Fluids/Electrolytes/Nutrition: Routine Is and Os and follow-up chemistries             -continue thiamine and folic acid supplements, and MVI             -10/08/22 Vit B12 level WNL at 865   8: Alcoholism: on Librium taper, ended prior to CIR admission; encourage cessation -10/09/22 some overnight confusion; wonder if it's from mild withdrawal vs daytime napping? Monitor for trend/pattern for now -4/29 restart low-dose gabapentin -improved, monitor 5-9: See above, would discontinue at discharge if intends to resume drinking.   9: History of gastric ulcers, non-bleeding: 2021 -continue Protonix 40mg  QD while hospitalized, sucralfate not restarted at this time   10: Hypertension: Monitor TID and prn; has been on lisinopril 10 mg in the past ([facility]>>he did not follow-up) but not restarted             -has been normotensive this admission  -4/29-5/8 well-controlled continue to monitor Vitals:   10/15/22 1952 10/16/22 0356 10/16/22 1451 10/16/22 2030  BP: 100/64 106/64 103/64 100/68   10/17/22 0402 10/17/22 1319 10/17/22 1931 10/18/22 0548  BP: 106/70 99/62 107/68 113/74   10/18/22 1351 10/18/22 2101 10/19/22 1447 10/19/22 1955  BP: 100/73 103/72 129/84 103/70      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 Conchita Paris   12: Right proximal humerus s/p ORIF 09/27/22              -NWB RUE, ok for AROM at elbow, wrist, and fingers -keep in sling -ordering alternate sling due to it being soiled 4-30; received 5-1             -follow-up with Dr. Dion Saucier -Per therapies, need to maintain in sling at all times to remind of nonweightbearing status; otherwise is impulsive   13:  Macrocytic anemia: Hgb stable at 10.8 on 10/08/22, monitor routine labs; B12 level WNL as above   14: Thrombocytopenia: improved on  10/08/22 labs, monitor on routine labs  15: Constipation: Solved -10/08/22 still no BM, will give Sorbitol 60ml x1 now, monitor for effect; continue miralax 17g BID, senokot S 1 tab BID, and PRNs -10/09/22 pt states BM this AM, not well documented; monitor, asked nursing to please try to document Bms -10/15/22 LBM 2 days ago, normal for him, monitor for now but may need to change regimen if no BM in next day or so  -10/20/22 LBM     16: Hypokalemia: Resolved  -4/29 will order 30 mEq potassium K-Dur -resolved 5-1, potassium 3.8.   17: Urinary incontinence.  Resolved -10/15/22 no further incontinence last night and no PVRs recorded but bladder scans show 0, so hopefully this has resolved; monitor -10/16/22 low PVRs, monitor but seems to be improving 5-6: Remain low, monitor 5/8: can DC PVRs    LOS: 13 days A FACE TO FACE EVALUATION WAS PERFORMED  Angelina Sheriff 10/20/2022, 9:13 AM

## 2022-10-21 ENCOUNTER — Other Ambulatory Visit (HOSPITAL_COMMUNITY): Payer: Self-pay

## 2022-10-21 MED ORDER — ADULT MULTIVITAMIN W/MINERALS CH: 1.0000 | ORAL_TABLET | Freq: Every day | ORAL | Status: DC

## 2022-10-21 MED ORDER — POLYETHYLENE GLYCOL 3350 17 GM/SCOOP PO POWD
17.0000 g | Freq: Every day | ORAL | 0 refills | Status: DC
  Filled 2022-10-21: qty 238, 14d supply, fill #0

## 2022-10-21 MED ORDER — GABAPENTIN 100 MG PO CAPS
100.0000 mg | ORAL_CAPSULE | Freq: Three times a day (TID) | ORAL | 0 refills | Status: DC
  Filled 2022-10-21: qty 90, 30d supply, fill #0

## 2022-10-21 MED ORDER — THIAMINE HCL 100 MG PO TABS
100.0000 mg | ORAL_TABLET | Freq: Every day | ORAL | 0 refills | Status: DC
  Filled 2022-10-21: qty 30, 30d supply, fill #0

## 2022-10-21 MED ORDER — PANTOPRAZOLE SODIUM 40 MG PO TBEC
40.0000 mg | DELAYED_RELEASE_TABLET | Freq: Every day | ORAL | 0 refills | Status: DC
  Filled 2022-10-21: qty 30, 30d supply, fill #0

## 2022-10-21 MED ORDER — SENNOSIDES-DOCUSATE SODIUM 8.6-50 MG PO TABS
1.0000 | ORAL_TABLET | Freq: Two times a day (BID) | ORAL | 0 refills | Status: DC
  Filled 2022-10-21: qty 60, 30d supply, fill #0

## 2022-10-21 MED ORDER — FOLIC ACID 1 MG PO TABS
1.0000 mg | ORAL_TABLET | Freq: Every day | ORAL | 0 refills | Status: DC
  Filled 2022-10-21: qty 30, 30d supply, fill #0

## 2022-10-21 NOTE — TOC Progression Note (Signed)
Discharge medications (6) are being stored in the main pharmacy on the ground floor until patient is ready for discharge.   

## 2022-10-21 NOTE — Progress Notes (Signed)
Inpatient Rehabilitation Care Coordinator Discharge Note   Patient Details  Name: Richard Andrews MRN: [id number] Date of Birth: 12/17/1960   Discharge location: D/c to home with support from his wife.  Length of Stay: 13 days  Discharge activity level: Supervision  Home/community participation: Limited  Patient response to:Health Literacy - How often do you need to have someone help you when you read instructions, pamphlets, or other written material from your doctor or pharmacy?: Never  Patient response to:Social Isolation - How often do you feel lonely or isolated from those around you?: Never  Services provided included: MD, RD, PT, OT, SLP, RN, CM, Neuropsych, SW, Pharmacy, TR  Financial Services:  Financial Services Utilized: Private Insurance BCBS  Choices offered to/list presented to: patient  Follow-up services arranged:  Outpatient, DME    Outpatient Servicies: [facility] location for PT/OT/SLP DME : [facility] for [facility]    Patient response to transportation need: Is the patient able to respond to transportation needs?: Yes In the past 12 months, has lack of transportation kept you from medical appointments or from getting medications?: No In the past 12 months, has lack of transportation kept you from meetings, work, or from getting things needed for daily living?: No   Patient/Family verbalized understanding of follow-up arrangements:  Yes  Individual responsible for coordination of the follow-up plan: contact pt wife Christina #[phone]  Confirmed correct DME delivered: Ferguson Gertner A Anthonyjames Bargar 10/21/2022    Comments (or additional information):Pt and wife are separated. Wife will continue to assist at this time. Pt likely to have support from his daughter as well. Wife and dtr completed fam edu.   Summary of Stay    Date/Time Discharge Planning CSW  10/18/22 0950 Discharge plan remains that he will return to the home with his brother in  law. Pt wife to assist with care needs despite not living together. SW will confirm there are no barriers to discharge. AAC  10/11/22 1049 Pt will return to the home with his brother in law. Pt wife to assist with care needs despite not living together. SW will confirm there are no barriers to discharge. AAC       Nacole Fluhr A Niralya Ohanian 

## 2022-10-21 NOTE — Progress Notes (Signed)
Occupational Therapy Discharge Summary  Patient Details  Name: Richard Andrews MRN: [id number] Date of Birth: 12-Mar-1961  Date of Discharge from OT service:Oct 21, 2022  Patient has met 13 of 13 long term goals due to improved activity tolerance, improved balance, postural control, ability to compensate for deficits, functional use of  LEFT upper and LEFT lower extremity, improved attention, improved awareness, and improved coordination.  Patient to discharge at overall Supervision level.  Patient's care partner is independent to provide the necessary physical and cognitive assistance at discharge.    Reasons goals not met: n/a  Recommendation:  Patient will benefit from ongoing skilled OT services in outpatient setting to continue to advance functional skills in the area of BADL, Reduce care partner burden, and functional use of L UE and R UE when cleared by ortho .  Equipment: Tub transfer bench  Reasons for discharge: treatment goals met and discharge from hospital  Patient/family agrees with progress made and goals achieved: Yes  OT Discharge Precautions/Restrictions  Precautions Precautions: Fall;Shoulder Shoulder Interventions: Shoulder sling/immobilizer;At all times;Off for dressing/bathing/exercises Precaution Booklet Issued: Yes (comment) Precaution Comments: OK for AROM at elbow, wrist, and digits only Required Braces or Orthoses: Sling Restrictions Weight Bearing Restrictions: Yes RUE Weight Bearing: Non weight bearing Pain  Denies pain ADL ADL Eating: Set up Where Assessed-Eating: Bed level Grooming: Modified independent Where Assessed-Grooming: Standing at sink Upper Body Bathing: Supervision/safety Where Assessed-Upper Body Bathing: Sitting at sink Lower Body Bathing: Supervision/safety Where Assessed-Lower Body Bathing: Sitting at sink Upper Body Dressing: Supervision/safety Where Assessed-Upper Body Dressing: Sitting at sink Lower Body Dressing:  Supervision/safety Where Assessed-Lower Body Dressing: Standing at sink, Sitting at sink Toileting: Supervision/safety Where Assessed-Toileting: Teacher, adult education: Close supervision Toilet Transfer Method: Insurance claims handler: Close supervison Cognition Cognition Overall Cognitive Status: Impaired/Different from baseline Arousal/Alertness: Awake/alert Orientation Level: Place;Person;Situation Person: Oriented Place: Oriented Situation: Oriented Memory: Impaired Awareness: Impaired Brief Interview for Mental Status (BIMS) Repetition of Three Words (First Attempt): 3 Temporal Orientation: Year: Correct Temporal Orientation: Month: Accurate within 5 days Temporal Orientation: Day: Correct Recall: "Sock": Yes, no cue required Recall: "Blue": Yes, no cue required Recall: "Bed": No, could not recall BIMS Summary Score: 13 Sensation Sensation Light Touch: Appears Intact Coordination Coordination and Movement Description: L UE coordination improved Motor  Motor Motor - Discharge Observations: improved L hemiplegia Mobility  Bed Mobility Bed Mobility: Supine to Sit Supine to Sit: Independent Transfers Sit to Stand: Supervision/Verbal cueing Stand to Sit: Supervision/Verbal cueing  Trunk/Postural Assessment     Balance Balance Balance Assessed: Yes Dynamic Sitting Balance Dynamic Sitting - Balance Support: During functional activity Dynamic Sitting - Level of Assistance: 7: Independent Static Standing Balance Static Standing - Balance Support: During functional activity Static Standing - Level of Assistance: 5: Stand by assistance Dynamic Standing Balance Dynamic Standing - Balance Support: During functional activity Dynamic Standing - Level of Assistance: 5: Stand by assistance Extremity/Trunk Assessment RUE Assessment RUE Assessment: Exceptions to [facility] Active Range of Motion (AROM) Comments: WFL elbow wrist and digit General Strength Comments: R  humeral head fx, RUE Body System: Ortho LUE Assessment LUE Assessment: Within Functional Limits   Richard Andrews Richard Andrews 10/21/2022, 8:24 AM

## 2022-10-21 NOTE — Progress Notes (Signed)
Occupational Therapy Session Note  Patient Details  Name: Richard Andrews MRN: [id number] Date of Birth: [birth year]  Today's Date: 10/21/2022 Session 1 OT Individual Time: 4098-1191 OT Individual Time Calculation (min): 58 min   Session 2 OT Individual Time: 4782-9562 OT Individual Time Calculation (min): 58 min    Short Term Goals: Week 2:  OT Short Term Goal 1 (Week 2): LTG=STG 2/2 ELOS  Skilled Therapeutic Interventions/Progress Updates:  Session 1   Pt greeted seated in recliner and agreeable to OT treatment session focused on self-care retraining. Pt doffed clothing sit<>stand from recliner with cues for not moving R shoulder, but overall supervision. Bathing completed from [facility] in shower with supervision overall. Pt ambulated out of bathroom without device w/ supervision and no LOB. Pt with good recall of one handed dressing strategies and recalled to dress his injured R UE first. Pt needed min cues for orienting sling, but overall supervision for dressing. Pt left seated in recliner at end of session with alarm on call bell in reach and needs met.  Pain:  Denies pain   Session 2 Pt greeted asleep in bed, easy to wake and agreeable to OT treatment session. Cognitive task completed with focus on memory, attention, and problem solving. Pt  then ambulated outside with supervision. Addressed ambulation on uneven surfaces, safety awareness in community environment, stair and curb negotiation. Pt demonstrated improved safety awareness by taking steps one at a time and able to complete this time with supervision and no overt LOB. Pt sat at picnic table and completed UB there-ex. OT had pt remove sling from R UE and completed 3 sets of 10 elbow flex/ext and wrist flex/ext. Using green theraband, pt completed 3 sets of 10 triceps press, bicep curl, and forward punch using only L UE. Addressed pathfinding back to room with no cues requiring. Pt left semi-reclined in bed with bed alarm on, call bell  in reach, and needs met.   Therapy Documentation Precautions:  Precautions Precautions: Fall, Shoulder Shoulder Interventions: Shoulder sling/immobilizer, At all times, Off for dressing/bathing/exercises Precaution Booklet Issued: Yes (comment) Precaution Comments: OK for AROM at elbow, wrist, and digits only Required Braces or Orthoses: Sling Restrictions Weight Bearing Restrictions: Yes RUE Weight Bearing: Non weight bearing Pain:  Denies pain  Therapy/Group: Individual Therapy  [name] 10/21/2022, 1:58 PM

## 2022-10-21 NOTE — Progress Notes (Signed)
PROGRESS NOTE   Subjective/Complaints:  No acute complaints. No events overnight.  Feeling well and stable for discharge this weekend.  No questions regarding process.  ROS:  +Insomnia-resolved + Pain -improved Denies fevers, chills, CP, SOB, cough, abdominal pain, N/V/D, new/worsening paresthesias/weakness, or any other complaints at this time.     Objective:   No results found. No results for input(s): "WBC", "HGB", "HCT", "PLT" in the last 72 hours.   No results for input(s): "NA", "K", "CL", "CO2", "GLUCOSE", "BUN", "CREATININE", "CALCIUM" in the last 72 hours.    Intake/Output Summary (Last 24 hours) at 10/21/2022 0849 Last data filed at 10/21/2022 0726 Gross per 24 hour  Intake 880 ml  Output --  Net 880 ml         Physical Exam: Vital Signs Blood pressure 110/73, pulse (!) 52, temperature 98.1 F (36.7 C), resp. rate 18, height 5\' 11"  (1.803 m), weight 62.7 kg, SpO2 98 %.  Constitutional: No apparent distress. Appropriate appearance for age.  Sitting uprate in bed. HENT: No JVD. Neck Supple. Trachea midline. Atraumatic, normocephalic. Eyes: PERRLA. No appreciable nystagmus at rest. Cardiovascular: RRR, no murmurs/rub/gallops. No Edema.  Right upper extremity capillary refill brisk - unchanged Respiratory: CTAB. No rales, rhonchi, or wheezing. On RA.  Abdomen: + bowel sounds, normoactive. No distention or tenderness.  GU: Not examined. Skin: RUE incisional site appears clean/dry/intact.  - stable  MsK: R shoulder in sling.  Full active range of motion of right hand, wrist      Strength: Bilateral lower and left upper extremities 5 out of 5, throughout                RUE: Grip, finger abduction 5 out of 5    Neurologic exam:  Cognition: AAO to person, place, and time.  + Memory deficit -stable, poor carryover, likely baseline   Insight: Improved insight into current condition; has capacity.  Mood:  Flat affect, appropriate mood.  Sensation: To light touch intact in BL UEs and LEs  Reflexes: 2+ in BL UE and LEs.   CN: + mild left facial droop; otherwise intact Coordination: No notable ataxia on ambulation Spasticity: MAS 0 in all extremities.  Assessment/Plan: 1. Functional deficits which require 3+ hours per day of interdisciplinary therapy in a comprehensive inpatient rehab setting. Physiatrist is providing close team supervision and 24 hour management of active medical problems listed below. Physiatrist and rehab team continue to assess barriers to discharge/monitor patient progress toward functional and medical goals  Care Tool:  Bathing    Body parts bathed by patient: Right arm, Chest, Left arm, Abdomen, Front perineal area, Right upper leg, Buttocks, Right lower leg, Left upper leg, Left lower leg, Face   Body parts bathed by helper: Left arm, Buttocks, Right lower leg, Left lower leg     Bathing assist Assist Level: Supervision/Verbal cueing     Upper Body Dressing/Undressing Upper body dressing   What is the patient wearing?: Pull over shirt, Orthosis Orthosis activity level: Performed by patient  Upper body assist Assist Level: Supervision/Verbal cueing    Lower Body Dressing/Undressing Lower body dressing      What is the patient wearing?: Underwear/pull  up, Pants     Lower body assist Assist for lower body dressing: Supervision/Verbal cueing     Toileting Toileting    Toileting assist Assist for toileting: Supervision/Verbal cueing     Transfers Chair/bed transfer  Transfers assist     Chair/bed transfer assist level: Supervision/Verbal cueing     Locomotion Ambulation   Ambulation assist      Assist level: Contact Guard/Touching assist Assistive device: No Device Max distance: 125   Walk 10 feet activity   Assist     Assist level: Contact Guard/Touching assist Assistive device: No Device   Walk 50 feet activity   Assist     Assist level: Contact Guard/Touching assist Assistive device: No Device    Walk 150 feet activity   Assist    Assist level: Contact Guard/Touching assist Assistive device: No Device    Walk 10 feet on uneven surface  activity   Assist     Assist level: Contact Guard/Touching assist Assistive device: Other (comment) (no AD)   Wheelchair     Assist Is the patient using a wheelchair?: Yes Type of Wheelchair: Manual    Wheelchair assist level: Supervision/Verbal cueing Max wheelchair distance: 150 ft    Wheelchair 50 feet with 2 turns activity    Assist        Assist Level: Supervision/Verbal cueing   Wheelchair 150 feet activity     Assist      Assist Level: Supervision/Verbal cueing   Blood pressure 110/73, pulse (!) 52, temperature 98.1 F (36.7 C), resp. rate 18, height 5\' 11"  (1.803 m), weight 62.7 kg, SpO2 98 %.  Medical Problem List and Plan: 1. Functional deficits secondary to traumatic SDH status post MMA embolization             -patient may shower             -ELOS/Goals: 16-18 days, Min A PT/OT/SLP - DC date 5/11  -Continue CIR  -Plan is home with brother; on STD -4/30: ADLs limited by cognitive deficits to CGA; Min A for ambulation; may be upgrading goals for stand mobility; SLP memory and awareness deficits limiting; low vocal intensity baseline? - grounds pass 5/3 a.m. for daughter's graduation; has completed family training and will be maintained at wheelchair level with min assist to CGA for transfers   2.  Antithrombotics: -DVT/anticoagulation:  Pharmaceutical: Lovenox 30 mg BID continued             -antiplatelet therapy: none   3. Pain Management: Avoid Tylenol; oxycodone 5-10mg  q4h PRN, Robaxin PRN -10/08/22 Per pharmacy, was on gabapentin outpatient; defer to primary team whether this is needing to be restarted. Also encouraged pt to utilize PRN pain meds since he's endorsing pain but no meds have been given -10/09/22  pain improving, remembered once yesterday to ask for pain meds -4/29 utilized 1 dose of 5 mg oxycodone this morning, pain gradually improving, will restart low-dose gabapentin 100 mg 3 times daily -4-30: Pain well-controlled, monitor -5-1: Discontinue Oxy 10 mg as needed; maintain Oxy 5 mg as needed.-Well-controlled -5-3: Decrease oxy 5 mg as needed to every 8 hours 5-6: Decrease oxycodone to 5 mg every 12 hours as needed.  Will not continue oxycodone at discharge. 5/9: Change oxycodone to 2.5-5 mg BID PRN. DC of gabapentin if patient intends to resume alcohol consumption on discharge; will readdress with him in a.m. 5-10: Patient remains undecided, discontinue gabapentin at discharge.   4. Mood/Behavior/Sleep: LCSW to evaluate and provide emotional support             -  antipsychotic agents: n/a  -Trazodone PRN -10/09/22 apparently some nighttime confusion and day/night confusion, napping a lot during the day; advised avoidance of naps during the day to keep sleep schedule better; will get sleep log tonight; monitor -4-30 - 5/1: Sleep log shows sleeping 10+ hours per night.  Feeling well rested.  Likely secondary to substance withdrawal.-Sleep improved -10/15/22 sleeping improved, slept ~7hrs last night; monitor 5-6: Endorses disrupted sleep, but stable, feeling well rested.  Monitor.   5. Neuropsych/cognition: This patient is capable of making decisions on his own behalf.  5-9: Significant cognitive improvement since admission.  Still some mild memory deficits, poor carryover, however these appear baseline.  Is able to reason for complex problems and is fully oriented.  At this point, would say he has regained capacity.   6. Skin/Wound Care: Routine skin care checks   7. Fluids/Electrolytes/Nutrition: Routine Is and Os and follow-up chemistries             -continue thiamine and folic acid supplements, and MVI             -10/08/22 Vit B12 level WNL at 865   8: Alcoholism: on Librium taper,  ended prior to CIR admission; encourage cessation -10/09/22 some overnight confusion; wonder if it's from mild withdrawal vs daytime napping? Monitor for trend/pattern for now -4/29 restart low-dose gabapentin -improved, monitor 5-9: See above, would discontinue at discharge if intends to resume drinking.   9: History of gastric ulcers, non-bleeding: 2021 -continue Protonix 40mg  QD while hospitalized, sucralfate not restarted at this time   10: Hypertension: Monitor TID and prn; has been on lisinopril 10 mg in the past ([facility]>>he did not follow-up) but not restarted             -has been normotensive this admission  -4/29-5/8 well-controlled continue to monitor Vitals:   10/16/22 2030 10/17/22 0402 10/17/22 1319 10/17/22 1931  BP: 100/68 106/70 99/62 107/68   10/18/22 0548 10/18/22 1351 10/18/22 2101 10/19/22 1447  BP: 113/74 100/73 103/72 129/84   10/19/22 1955 10/20/22 1606 10/20/22 1924 10/21/22 0511  BP: 103/70 109/77 102/74 110/73      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 Conchita Paris   12: Right proximal humerus s/p ORIF 09/27/22              -NWB RUE, ok for AROM at elbow, wrist, and fingers -keep in sling -ordering alternate sling due to it being soiled 4-30; received 5-1             -follow-up with Dr. Dion Saucier -Per therapies, need to maintain in sling at all times to remind of nonweightbearing status; otherwise is impulsive   13: Macrocytic anemia: Hgb stable at 10.8 on 10/08/22, monitor routine labs; B12 level WNL as above   14: Thrombocytopenia: improved on 10/08/22 labs, monitor on routine labs  15: Constipation: Solved -10/08/22 still no BM, will give Sorbitol 60ml x1 now, monitor for effect; continue miralax 17g BID, senokot S 1 tab BID, and PRNs -10/09/22 pt states BM this AM, not well documented; monitor, asked nursing to please try to document Bms -10/15/22 LBM 2 days ago, normal for him, monitor for now  but may need to change regimen if no BM in next day or so  -10/21/22 LBM     16: Hypokalemia: Resolved  -4/29 will order 30 mEq potassium K-Dur -resolved 5-1, potassium 3.8.  17: Urinary incontinence.  Resolved -10/15/22 no further incontinence last night and no PVRs recorded but bladder scans show 0, so hopefully this has resolved; monitor -10/16/22 low PVRs, monitor but seems to be improving 5-6: Remain low, monitor 5/8: can DC PVRs    LOS: 14 days A FACE TO FACE EVALUATION WAS PERFORMED  Angelina Sheriff 10/21/2022, 8:49 AM

## 2022-10-21 NOTE — Progress Notes (Signed)
Speech Language Pathology Discharge Summary  Patient Details  Name: Richard Andrews MRN: [id number] Date of Birth: [birth year]  Date of Discharge from SLP service:Oct 21, 2022  Patient has met 7 of 7 long term goals.  Patient to discharge at [facility] level.   Reasons goals not met: N/A   Clinical Impression/Discharge Summary: Patient has made functional gains and has met 7 of 7 LTGs this admission. Currently, patient demonstrates behaviors consistent with a [place] Level VII and requires overall Min A multimodal cues to complete functional and mildly complex task safely in regards to problem solving, recall, and awareness. Patient demonstrates improved speech intelligibility but continues to require supervision level verbal cues for use of strategies at the sentence level to achieve ~90% intelligibility. Patient and family education is complete and patient will discharge home with family. Patient would benefit from f/u SLP services to maximize his cognitive functioning and functional communication in order to reduce caregiver burden.   Care Partner:  Caregiver Able to Provide Assistance: Yes  Type of Caregiver Assistance: Cognitive  Recommendation:  24 hour supervision/assistance;Outpatient SLP  Rationale for SLP Follow Up: Maximize cognitive function and independence;Maximize functional communication;Reduce caregiver burden   Equipment: N/A   Reasons for discharge: Treatment goals met;Discharged from hospital   Patient/Family Agrees with Progress Made and Goals Achieved: Yes    [name] 10/21/2022, 6:20 AM

## 2022-10-21 NOTE — Plan of Care (Signed)
  Problem: RH Balance Goal: LTG: Patient will maintain dynamic sitting balance (OT) Description: LTG:  Patient will maintain dynamic sitting balance with assistance during activities of daily living (OT) Outcome: Completed/Met Goal: LTG Patient will maintain dynamic standing with ADLs (OT) Description: LTG:  Patient will maintain dynamic standing balance with assist during activities of daily living (OT)  Outcome: Completed/Met   Problem: RH Eating Goal: LTG Patient will perform eating w/assist, cues/equip (OT) Description: LTG: Patient will perform eating with assist, with/without cues using equipment (OT) Outcome: Completed/Met   Problem: RH Grooming Goal: LTG Patient will perform grooming w/assist,cues/equip (OT) Description: LTG: Patient will perform grooming with assist, with/without cues using equipment (OT) Outcome: Completed/Met   Problem: RH Bathing Goal: LTG Patient will bathe all body parts with assist levels (OT) Description: LTG: Patient will bathe all body parts with assist levels (OT) Outcome: Completed/Met   Problem: RH Dressing Goal: LTG Patient will perform upper body dressing (OT) Description: LTG Patient will perform upper body dressing with assist, with/without cues (OT). Outcome: Completed/Met Goal: LTG Patient will perform lower body dressing w/assist (OT) Description: LTG: Patient will perform lower body dressing with assist, with/without cues in positioning using equipment (OT) Outcome: Completed/Met   Problem: RH Toileting Goal: LTG Patient will perform toileting task (3/3 steps) with assistance level (OT) Description: LTG: Patient will perform toileting task (3/3 steps) with assistance level (OT)  Outcome: Completed/Met   Problem: RH Toilet Transfers Goal: LTG Patient will perform toilet transfers w/assist (OT) Description: LTG: Patient will perform toilet transfers with assist, with/without cues using equipment (OT) Outcome: Completed/Met    Problem: RH Tub/Shower Transfers Goal: LTG Patient will perform tub/shower transfers w/assist (OT) Description: LTG: Patient will perform tub/shower transfers with assist, with/without cues using equipment (OT) Outcome: Completed/Met   Problem: RH Memory Goal: LTG Patient will demonstrate ability for day to day recall/carry over during activities of daily living with assistance level (OT) Description: LTG:  Patient will demonstrate ability for day to day recall/carry over during activities of daily living with assistance level (OT). Outcome: Completed/Met   Problem: RH Attention Goal: LTG Patient will demonstrate this level of attention during functional activites (OT) Description: LTG:  Patient will demonstrate this level of attention during functional activites  (OT) Outcome: Completed/Met   Problem: RH Awareness Goal: LTG: Patient will demonstrate awareness during functional activites type of (OT) Description: LTG: Patient will demonstrate awareness during functional activites type of (OT) Outcome: Completed/Met   

## 2022-10-21 NOTE — Plan of Care (Signed)
  Problem: RH Cognition - SLP Goal: RH LTG Patient will demonstrate orientation with cues Description:  LTG:  Patient will demonstrate orientation to person/place/time/situation with cues (SLP)   Outcome: Completed/Met   Problem: RH Comprehension Communication Goal: LTG Patient will comprehend basic/complex auditory (SLP) Description: LTG: Patient will comprehend basic/complex auditory information with cues (SLP). Outcome: Completed/Met   Problem: RH Expression Communication Goal: LTG Patient will increase speech intelligibility (SLP) Description: LTG: Patient will increase speech intelligibility at word/phrase/conversation level with cues, % of the time (SLP) Outcome: Completed/Met   Problem: RH Problem Solving Goal: LTG Patient will demonstrate problem solving for (SLP) Description: LTG:  Patient will demonstrate problem solving for basic/complex daily situations with cues  (SLP) Outcome: Completed/Met   Problem: RH Memory Goal: LTG Patient will demonstrate ability for day to day (SLP) Description: LTG:   Patient will demonstrate ability for day to day recall/carryover during cognitive/linguistic activities with assist  (SLP) Outcome: Completed/Met   Problem: RH Attention Goal: LTG Patient will demonstrate this level of attention during functional activites (SLP) Description: LTG:  Patient will will demonstrate this level of attention during functional activites (SLP) Outcome: Completed/Met   Problem: RH Awareness Goal: LTG: Patient will demonstrate awareness during functional activites type of (SLP) Description: LTG: Patient will demonstrate awareness during functional activites type of (SLP) Outcome: Completed/Met

## 2022-10-21 NOTE — Progress Notes (Signed)
Physical Therapy Discharge Summary  Patient Details  Name: Richard Andrews MRN: [id number] Date of Birth: January 06, 1961  Date of Discharge from PT service:Oct 21, 2022  Today's Date: 10/21/2022 PT Individual Time: 1015-1130 PT Individual Time Calculation (min): 75 min   Patient has met 9 of 9 long term goals due to improved activity tolerance, improved balance, improved postural control, increased strength, ability to compensate for deficits, functional use of  left lower extremity, improved attention, improved awareness, and improved coordination.  Patient to discharge at an ambulatory level Supervision.   Patient's care partner is independent to provide the necessary cognitive/Supv assistance at discharge.  Reasons goals not met: NA  Recommendation:  Patient will benefit from ongoing skilled PT services in outpatient setting to continue to advance safe functional mobility, address ongoing impairments in dynamic stability, higher-level gait, safety awareness, L attention, and minimize fall risk.  Equipment: No equipment provided  Reasons for discharge: treatment goals met and discharge from hospital  Patient/family agrees with progress made and goals achieved: Yes  PT Discharge Precautions/Restrictions Precautions Precautions: Fall;Shoulder Shoulder Interventions: Shoulder sling/immobilizer;At all times;Off for dressing/bathing/exercises Precaution Booklet Issued: Yes (comment) Precaution Comments: OK for AROM at elbow, wrist, and digits only Required Braces or Orthoses: Sling Restrictions Weight Bearing Restrictions: Yes RUE Weight Bearing: Non weight bearing Pain Patient denies pain. Pain Interference Pain Interference Pain Effect on Sleep: 1. Rarely or not at all Pain Interference with Therapy Activities: 1. Rarely or not at all Pain Interference with Day-to-Day Activities: 1. Rarely or not at all Vision/Perception  Vision - History Baseline Vision: Wears glasses only for  reading Ability to See in Adequate Light: 1 Impaired Perception Perception: Within Functional Limits Praxis Praxis: Intact  Cognition Overall Cognitive Status: Impaired/Different from baseline Arousal/Alertness: Awake/alert Orientation Level: Oriented X4 Year: 2024 Month: May Day of Week: Correct Focused Attention: Appears intact Sustained Attention: Appears intact Memory: Impaired Awareness: Impaired Problem Solving: Impaired Safety/Judgment: Impaired Sensation Sensation Light Touch: Appears Intact Proprioception: Impaired by gross assessment Coordination Gross Motor Movements are Fluid and Coordinated: No Fine Motor Movements are Fluid and Coordinated: No Coordination and Movement Description: Significantly improved since initial evaluation Motor  Motor Motor - Discharge Observations: Significantly improved L hemi  Mobility Bed Mobility Bed Mobility: Rolling Right;Rolling Left;Supine to Sit;Sit to Supine Rolling Right: Independent Rolling Left: Independent Supine to Sit: Independent Sit to Supine: Independent Transfers Transfers: Sit to Stand;Stand to Sit;Stand Pivot Transfers Sit to Stand: Supervision/Verbal cueing Stand to Sit: Supervision/Verbal cueing Stand Pivot Transfers: Supervision/Verbal cueing Transfer (Assistive device): None Locomotion  Gait Ambulation: Yes Gait Assistance: Supervision/Verbal cueing Gait Distance (Feet): 200 Feet Assistive device: None Gait Gait: Yes Gait Pattern:  (Decreased foot clearance, L>R) Stairs / Additional Locomotion Stairs: Yes Stairs Assistance: Supervision/Verbal cueing Stair Management Technique: One rail Left Number of Stairs: 12 Height of Stairs: 6 Ramp: Supervision/Verbal cueing Curb: Air traffic controller Mobility: No Wheelchair Assistance: Independent with Scientist, research (life sciences): Both lower extermities Wheelchair Parts Management: Needs  assistance Distance: 150  Trunk/Postural Assessment  Cervical Assessment Cervical Assessment: Exceptions to [facility] (Forward and flexed head) Thoracic Assessment Thoracic Assessment: Exceptions to [name] (Rounded shoulders) Lumbar Assessment Lumbar Assessment: Exceptions to [facility] (Posterior pelvic tilt) Postural Control Postural Control: Deficits on evaluation Righting Reactions: Delayed, however improved since initial evaluation  Balance Balance Balance Assessed: Yes Standardized Balance Assessment Standardized Balance Assessment: Berg Balance Test Berg Balance Test Sit to Stand: Able to stand without using hands and stabilize independently Standing Unsupported: Able to stand  safely 2 minutes Sitting with Back Unsupported but Feet Supported on Floor or Stool: Able to sit safely and securely 2 minutes Stand to Sit: Sits safely with minimal use of hands Transfers: Able to transfer safely, minor use of hands Standing Unsupported with Eyes Closed: Able to stand 10 seconds safely Standing Ubsupported with Feet Together: Able to place feet together independently and stand 1 minute safely From Standing, Reach Forward with Outstretched Arm: Can reach forward >12 cm safely (5") From Standing Position, Pick up Object from Floor: Able to pick up shoe, needs supervision From Standing Position, Turn to Look Behind Over each Shoulder: Looks behind from both sides and weight shifts well Turn 360 Degrees: Able to turn 360 degrees safely but slowly Standing Unsupported, Alternately Place Feet on Step/Stool: Able to stand independently and safely and complete 8 steps in 20 seconds Standing Unsupported, One Foot in Front: Able to plae foot ahead of the other independently and hold 30 seconds Standing on One Leg: Able to lift leg independently and hold > 10 seconds Total Score: 51 Dynamic Sitting Balance Dynamic Sitting - Balance Support: During functional activity Dynamic Sitting - Level of Assistance: 7:  Independent Static Standing Balance Static Standing - Balance Support: During functional activity Static Standing - Level of Assistance: 5: Stand by assistance Dynamic Standing Balance Dynamic Standing - Balance Support: During functional activity Dynamic Standing - Level of Assistance: 5: Stand by assistance Extremity Assessment  RUE Assessment RUE Assessment: Exceptions to [facility] Active Range of Motion (AROM) Comments: WFL elbow wrist and digit General Strength Comments: R humeral head fx, RUE Body System: Ortho LUE Assessment LUE Assessment: Within Functional Limits RLE Assessment RLE Assessment: Within Functional Limits LLE Assessment LLE Assessment: Within Functional Limits  Skilled Intervention- Patient greeted supine in bed and agreeable to PT treatment session. Patient completed bed mobility ModI and requested to use the restroom prior to start of session- Patient ambulated to/from the bathroom without the use of an AD and Supv. Patient with continent void and was able to manage pants/brief independently. Patient gait trained >150' without the use of an AD, performed a car transfer, ascended up/down 12+ steps with L HR, ascended/descended a low grade ramp, ambulated across mulch and picked up an item from the floor all with supv assistance for safety.   Patient completed the Berg Balance Assessment in order to further assess his balance/stability with functional mobility and see how much progress he has made since initial test. Patient demonstrates increased fall risk as noted by score of 51/56 on Berg Balance Scale (<36= high risk for falls, close to 100%; 37-45 significant >80%; 46-51 moderate >50%; 52-55 lower >25%). Patient scored a 13/56 initially and was able to improve his score to a 51/56.   Patient provided with home exercise program (please see below for further details) and therapist and patient went through each exercise for better understanding and mechanics.   Patient  then requesting to go outside- Patient ambulated throughout the 4th floor, on/off elevators, throughout the 1st floor, and outside on various surfaces all with supv and no LOB noted.   Patient returned to his room and left sitting in bedside recliner with posey belt on, call bell within reach and all needs met.   Access Code: [email or web address] URL: [email or web address] Date: 10/21/2022 Prepared by: Sherron Ales Ariona Deschene  Exercises - Sit to Stand with Arms Crossed  - 1 x daily - 7 x weekly - 3 sets - 10 reps - Side Stepping with Resistance at Ankles  and Counter Support  - 1 x daily - 7 x weekly - 3 sets - 10 reps - Backward Monster Walk with Resistance at [employer] and Counter Support  - 1 x daily - 7 x weekly - 3 sets - 10 reps - Forward Monster Walk with Resistance at [employer] and Counter Support  - 1 x daily - 7 x weekly - 3 sets - 10 reps - Standing Tandem Balance with Counter Support  - 1 x daily - 7 x weekly - 3 sets - 10 reps - Narrow Stance with Unilateral Counter Support  - 1 x daily - 7 x weekly - 3 sets - 10 reps   Deeanna Beightol 10/21/2022, 10:43 AM

## 2022-10-22 NOTE — Progress Notes (Signed)
PROGRESS NOTE   Subjective/Complaints:  Pt doing well today, ready to go home! Slept poorly but eventually fell asleep and was able to sleep. Pain well managed. LBM yesterday. Urinating well. Denies any other complaints or concerns today.   ROS:  +Insomnia-resolved/fluctuating + Pain -improved Denies fevers, chills, CP, SOB, cough, abdominal pain, N/V/D, new/worsening paresthesias/weakness, or any other complaints at this time.     Objective:   No results found. No results for input(s): "WBC", "HGB", "HCT", "PLT" in the last 72 hours.   No results for input(s): "NA", "K", "CL", "CO2", "GLUCOSE", "BUN", "CREATININE", "CALCIUM" in the last 72 hours.    Intake/Output Summary (Last 24 hours) at 10/22/2022 1131 Last data filed at 10/22/2022 0759 Gross per 24 hour  Intake 940 ml  Output --  Net 940 ml        Physical Exam: Vital Signs Blood pressure 112/71, pulse 60, temperature 98.1 F (36.7 C), resp. rate 18, height 5\' 11"  (1.803 m), weight 62.7 kg, SpO2 98 %.  Constitutional: No apparent distress. Appropriate appearance for age.  Sitting upright in chair. HENT: No JVD. Neck Supple. Trachea midline. Atraumatic, normocephalic. Eyes: PERRLA. No appreciable nystagmus at rest. Cardiovascular: RRR, no murmurs/rub/gallops. No Edema.  Right upper extremity capillary refill brisk - unchanged Respiratory: CTAB. No rales, rhonchi, or wheezing. On RA.  Abdomen: + bowel sounds, normoactive. No distention or tenderness.  GU: Not examined. Skin: RUE incisional site appears clean/dry/intact.  - stable  MsK: R shoulder in sling.    PRIOR EXAMS: MsK: Full active range of motion of right hand, wrist      Strength: Bilateral lower and left upper extremities 5 out of 5, throughout                RUE: Grip, finger abduction 5 out of 5    Neurologic exam:  Cognition: AAO to person, place, and time.  + Memory deficit -stable, poor  carryover, likely baseline   Insight: Improved insight into current condition; has capacity.  Mood: Flat affect, appropriate mood.  Sensation: To light touch intact in BL UEs and LEs  Reflexes: 2+ in BL UE and LEs.   CN: + mild left facial droop; otherwise intact Coordination: No notable ataxia on ambulation Spasticity: MAS 0 in all extremities.  Assessment/Plan: 1. Functional deficits which require 3+ hours per day of interdisciplinary therapy in a comprehensive inpatient rehab setting. Physiatrist is providing close team supervision and 24 hour management of active medical problems listed below. Physiatrist and rehab team continue to assess barriers to discharge/monitor patient progress toward functional and medical goals  Care Tool:  Bathing    Body parts bathed by patient: Right arm, Chest, Left arm, Abdomen, Front perineal area, Right upper leg, Buttocks, Right lower leg, Left upper leg, Left lower leg, Face   Body parts bathed by helper: Left arm, Buttocks, Right lower leg, Left lower leg     Bathing assist Assist Level: Supervision/Verbal cueing     Upper Body Dressing/Undressing Upper body dressing   What is the patient wearing?: Pull over shirt, Orthosis Orthosis activity level: Performed by patient  Upper body assist Assist Level: Supervision/Verbal cueing  Lower Body Dressing/Undressing Lower body dressing      What is the patient wearing?: Underwear/pull up, Pants     Lower body assist Assist for lower body dressing: Supervision/Verbal cueing     Toileting Toileting    Toileting assist Assist for toileting: Supervision/Verbal cueing     Transfers Chair/bed transfer  Transfers assist     Chair/bed transfer assist level: Supervision/Verbal cueing     Locomotion Ambulation   Ambulation assist      Assist level: Supervision/Verbal cueing Assistive device: No Device Max distance: >150'   Walk 10 feet activity   Assist     Assist  level: Supervision/Verbal cueing Assistive device: No Device   Walk 50 feet activity   Assist    Assist level: Supervision/Verbal cueing Assistive device: No Device    Walk 150 feet activity   Assist    Assist level: Supervision/Verbal cueing Assistive device: No Device    Walk 10 feet on uneven surface  activity   Assist     Assist level: Supervision/Verbal cueing Assistive device: Other (comment) (No AD)   Wheelchair     Assist Is the patient using a wheelchair?: No Type of Wheelchair: Manual    Wheelchair assist level: Independent Max wheelchair distance: 150 ft    Wheelchair 50 feet with 2 turns activity    Assist        Assist Level: Independent   Wheelchair 150 feet activity     Assist      Assist Level: Independent   Blood pressure 112/71, pulse 60, temperature 98.1 F (36.7 C), resp. rate 18, height 5\' 11"  (1.803 m), weight 62.7 kg, SpO2 98 %.  Medical Problem List and Plan: 1. 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 - grounds pass 5/3 a.m. for daughter's graduation; has completed family training and will be maintained at wheelchair level with min assist to CGA for transfers   2.  Antithrombotics: -DVT/anticoagulation:  Pharmaceutical: Lovenox 30 mg BID continued             -antiplatelet therapy: none   3. Pain Management: Avoid Tylenol; oxycodone 5-10mg  q4h PRN, Robaxin PRN -10/08/22 Per pharmacy, was on gabapentin outpatient; defer to primary team whether this is needing to be restarted. Also encouraged pt to utilize PRN pain meds since he's endorsing  pain but no meds have been given -10/09/22 pain improving, remembered once yesterday to ask for pain meds -4/29 utilized 1 dose of 5 mg oxycodone this morning, pain gradually improving, will restart low-dose gabapentin 100 mg 3 times daily -4-30: Pain well-controlled, monitor -5-1: Discontinue Oxy 10 mg as needed; maintain Oxy 5 mg as needed.-Well-controlled -5-3: Decrease oxy 5 mg as needed to every 8 hours 5-6: Decrease oxycodone to 5 mg every 12 hours as needed.  Will not continue oxycodone at discharge. 5/9: Change oxycodone to 2.5-5 mg BID PRN. DC of gabapentin if patient intends to resume alcohol consumption on discharge; will readdress with him in a.m. 5-10: Patient remains undecided, discontinue gabapentin at discharge.  4. Mood/Behavior/Sleep: LCSW to evaluate and provide emotional support             -antipsychotic agents: n/a  -Trazodone PRN -10/09/22 apparently some nighttime confusion and day/night confusion, napping a lot during the day; advised avoidance of naps during the day to keep sleep schedule better; will get sleep log tonight; monitor -4-30 - 5/1: Sleep log shows sleeping 10+ hours per night.  Feeling well rested.  Likely secondary to substance withdrawal.-Sleep improved -10/15/22 sleeping improved, slept ~7hrs last night; monitor 5-6: Endorses disrupted sleep, but stable, feeling well rested.  Monitor. -10/22/22 poor sleep but overall better than prior; monitor at home   5. Neuropsych/cognition: This patient is capable of making decisions on his own behalf.  5-9: Significant cognitive improvement since admission.  Still some mild memory deficits, poor carryover, however these appear baseline.  Is able to reason for complex problems and is fully oriented.  At this point, would say he has regained capacity.   6. Skin/Wound Care: Routine skin care checks   7. Fluids/Electrolytes/Nutrition: Routine Is and Os and follow-up chemistries             -continue thiamine and folic  acid supplements, and MVI             -10/08/22 Vit B12 level WNL at 865   8: Alcoholism: on Librium taper, ended prior to CIR admission; encourage cessation -10/09/22 some overnight confusion; wonder if it's from mild withdrawal vs daytime napping? Monitor for trend/pattern for now -4/29 restart low-dose gabapentin -improved, monitor 5-9: See above, would discontinue at discharge if intends to resume drinking.   9: History of gastric ulcers, non-bleeding: 2021 -continue Protonix 40mg  QD while hospitalized, sucralfate not restarted at this time   10: Hypertension: Monitor TID and prn; has been on lisinopril 10 mg in the past ([facility]>>he did not follow-up) but not restarted             -has been normotensive this admission  -4/29-5/11 well-controlled continue to monitor outpatient Vitals:   10/17/22 1931 10/18/22 0548 10/18/22 1351 10/18/22 2101  BP: 107/68 113/74 100/73 103/72   10/19/22 1447 10/19/22 1955 10/20/22 1606 10/20/22 1924  BP: 129/84 103/70 109/77 102/74   10/21/22 0511 10/21/22 1250 10/21/22 1923 10/22/22 0348  BP: 110/73 112/81 110/70 112/71      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 Conchita Paris   12: Right proximal humerus s/p ORIF 09/27/22              -NWB RUE, ok for AROM at elbow, wrist, and fingers -keep in sling -ordering alternate sling due to it being soiled 4-30; received 5-1             -follow-up with Dr. Dion Saucier -Per therapies, need to maintain in sling at all times to remind of nonweightbearing status; otherwise is impulsive   13: Macrocytic anemia: Hgb stable at 10.8 on 10/08/22, monitor routine labs; B12 level WNL as above   14: Thrombocytopenia: improved on 10/08/22 labs, monitor on routine labs  15: Constipation: Solved -10/08/22 still no BM, will give Sorbitol 60ml x1 now, monitor for effect; continue miralax 17g BID, senokot S 1 tab BID, and PRNs -10/09/22 pt states BM this AM, not  well documented; monitor, asked nursing to please try to document Bms -10/15/22 LBM 2 days ago, normal for him, monitor for now but may need  to change regimen if no BM in next day or so  -10/22/22 LBM yesterday      16: Hypokalemia: Resolved  -4/29 will order 30 mEq potassium K-Dur -resolved 5-1, potassium 3.8.   17: Urinary incontinence.  Resolved -10/15/22 no further incontinence last night and no PVRs recorded but bladder scans show 0, so hopefully this has resolved; monitor -10/16/22 low PVRs, monitor but seems to be improving 5-6: Remain low, monitor 5/8: can DC PVRs    LOS: 15 days A FACE TO FACE EVALUATION WAS PERFORMED  [address] Richard Andrews 10/22/2022, 11:31 AM

## 2022-10-22 NOTE — Progress Notes (Signed)
Inpatient Rehabilitation Discharge Medication Review by a Pharmacist  A complete drug regimen review was completed for this patient to identify any potential clinically significant medication issues.  High Risk Drug Classes Is patient taking? Indication by Medication  Antipsychotic No   Anticoagulant No   Antibiotic No   Opioid No   Antiplatelet No   Hypoglycemics/insulin No   Vasoactive Medication No   Chemotherapy No   Other Yes Folic acid, thiamine- supplements Methocarbamol- muscle spasms Protonix -history of gastric ulcers. Gabapentin - neuropathy     Type of Medication Issue Identified Description of Issue Recommendation(s)  Drug Interaction(s) (clinically significant)     Duplicate Therapy     Allergy     No Medication Administration End Date     Incorrect Dose     Additional Drug Therapy Needed     Significant med changes from prior encounter (inform family/care partners about these prior to discharge).    Other        Clinically significant medication issues were identified that warrant physician communication and completion of prescribed/recommended actions by midnight of the next day:  No  Time spent performing this drug regimen review (minutes):  15  Andreas Ohm, PharmD Pharmacy Resident  10/22/2022 9:52 AM

## 2022-10-24 ENCOUNTER — Telehealth: Payer: Self-pay | Admitting: *Deleted

## 2022-10-24 NOTE — Telephone Encounter (Signed)
Transition Care Management Unsuccessful Follow-up Telephone Call  Date of discharge and from where:  10/22/22  Attempts:  1st Attempt  Reason for unsuccessful TCM follow-up call:  Unable to reach patient due to discharge summary being incomplete.

## 2022-10-26 NOTE — Telephone Encounter (Signed)
Transition Care Management Unsuccessful Follow-up Telephone Call  Date of discharge and from where:  10/22/2022 hospital  Attempts:  2nd Attempt  Reason for unsuccessful TCM follow-up call:  Left voice message no answer left voicemail for patient to call back

## 2022-11-01 NOTE — Progress Notes (Deleted)
Subjective:    Patient ID: ROC TEEM, male    DOB: 1961-04-04, 62 y.o.   MRN: [id number]  HPI   Pain Inventory Average Pain {NUMBERS; 0-10:5044} Pain Right Now {NUMBERS; 0-10:5044} My pain is {PAIN DESCRIPTION:21022940}  LOCATION OF PAIN  ***  BOWEL Number of stools per week: *** Oral laxative use {YES/NO:21197} Type of laxative *** Enema or suppository use {YES/NO:21197} History of colostomy {YES/NO:21197} Incontinent {YES/NO:21197}  BLADDER {bladder options:24190} In and out cath, frequency *** Able to self cath {YES/NO:21197} Bladder incontinence {YES/NO:21197} Frequent urination {YES/NO:21197} Leakage with coughing {YES/NO:21197} Difficulty starting stream {YES/NO:21197} Incomplete bladder emptying {YES/NO:21197}   Mobility {MOBILITY WUJ:81191478}  Function {FUNCTION:21022946}  Neuro/Psych {NEURO/PSYCH:21022948}  Prior Studies {CPRM PRIOR STUDIES:21022953}  Physicians involved in your care {CPRM PHYSICIANS INVOLVED IN YOUR CARE:21022954}   Family History  Problem Relation Age of Onset   Heart disease Mother    Hyperlipidemia Mother    Skin cancer Mother    Cancer Father    Multiple myeloma Sister    Throat cancer Sister    Social History   Socioeconomic History   Marital status: Married    Spouse name: Trula Ore   Number of children: 5   Years of education: 12   Highest education level: Not on file  Occupational History   Occupation: Emergency planning/management officer  Tobacco Use   Smoking status: Every Day    Packs/day: 1.00    Years: 10.00    Additional pack years: 0.00    Total pack years: 10.00    Types: Cigarettes   Smokeless tobacco: Current    Types: Chew  Vaping Use   Vaping Use: Never used  Substance and Sexual Activity   Alcohol use: Yes    Alcohol/week: 16.0 - 20.0 standard drinks of alcohol    Types: 16 - 20 Standard drinks or equivalent per week    Comment: 1 gallon/week   Drug use: Never   Sexual activity: Yes    Partners:  Female  Other Topics Concern   Not on file  Social History Narrative   Not on file   Social Determinants of Health   Financial Resource Strain: Not on file  Food Insecurity: Not on file  Transportation Needs: Not on file  Physical Activity: Not on file  Stress: Not on file  Social Connections: Not on file   Past Surgical History:  Procedure Laterality Date   BIOPSY  01/22/2021   Procedure: BIOPSY;  Surgeon: Shellia Cleverly, DO;  Location: [facility];  Service: Gastroenterology;;   ESOPHAGOGASTRODUODENOSCOPY (EGD) WITH PROPOFOL N/A 01/22/2021   Procedure: ESOPHAGOGASTRODUODENOSCOPY (EGD) WITH PROPOFOL;  Surgeon: Shellia Cleverly, DO;  Location: [facility];  Service: Gastroenterology;  Laterality: N/A;   I & D EXTREMITY Right 01/10/2020   Procedure: ORIF RIGHT WRIST;  Surgeon: Dominica Severin, MD;  Location: [facility];  Service: Orthopedics;  Laterality: Right;   IR ANGIO EXTERNAL CAROTID SEL EXT CAROTID UNI R MOD SED  10/04/2022   IR ANGIO INTRA EXTRACRAN SEL INTERNAL CAROTID UNI R MOD SED  09/30/2022   IR ANGIOGRAM FOLLOW UP STUDY  09/30/2022   IR NEURO EACH ADD'L AFTER BASIC UNI RIGHT (MS)  10/04/2022   IR TRANSCATH/EMBOLIZ  09/30/2022   OPEN REDUCTION INTERNAL FIXATION (ORIF) DISTAL RADIAL FRACTURE Left 01/28/2015   Procedure: OPEN REDUCTION INTERNAL FIXATION (ORIF) LEFT DISTAL RADIAL FRACTURE;  Surgeon: Tarry Kos, MD;  Location: [facility];  Service: Orthopedics;  Laterality: Left;   ORIF HUMERUS FRACTURE Right 09/27/2022  Procedure: OPEN REDUCTION INTERNAL FIXATION (ORIF) PROXIMAL HUMERUS FRACTURE;  Surgeon: Teryl Lucy, MD;  Location: [facility];  Service: Orthopedics;  Laterality: Right;   RADIOLOGY WITH ANESTHESIA N/A 09/30/2022   Procedure: IR WITH ANESTHESIA MMA EMBOLIZATION;  Surgeon: Radiologist, Medication, MD;  Location: [facility];  Service: Radiology;  Laterality: N/A;   TONSILLECTOMY     Past Medical History:  Diagnosis Date   Allergy    Distal radius  fracture, left    Hypertension    There were no vitals taken for this visit.  Opioid Risk Score:   Fall Risk Score:  `1  Depression screen [facility] 2/9     02/01/2021    9:35 AM 02/06/2020    3:30 PM 12/20/2016    2:02 PM  Depression screen PHQ 2/9  Decreased Interest 0 0 2  Down, Depressed, Hopeless 0 0 0  PHQ - 2 Score 0 0 2  Altered sleeping  0 3  Tired, decreased energy  2 3  Change in appetite  3 3  Feeling bad or failure about yourself   0 3  Trouble concentrating  0 0  Moving slowly or fidgety/restless  0 2  Suicidal thoughts  0 0  PHQ-9 Score  5 16  Difficult doing work/chores  Somewhat difficult     Review of Systems     Objective:   Physical Exam        Assessment & Plan:

## 2022-11-02 ENCOUNTER — Ambulatory Visit: Payer: BC Managed Care – PPO | Admitting: Occupational Therapy

## 2022-11-02 ENCOUNTER — Encounter: Payer: BC Managed Care – PPO | Admitting: Physical Medicine and Rehabilitation

## 2022-11-02 ENCOUNTER — Ambulatory Visit: Payer: BC Managed Care – PPO | Admitting: Physical Therapy

## 2022-11-02 ENCOUNTER — Ambulatory Visit: Payer: BC Managed Care – PPO

## 2022-11-11 DIAGNOSIS — S42291D Other displaced fracture of upper end of right humerus, subsequent encounter for fracture with routine healing: Secondary | ICD-10-CM | POA: Diagnosis not present

## 2022-11-15 ENCOUNTER — Emergency Department (HOSPITAL_BASED_OUTPATIENT_CLINIC_OR_DEPARTMENT_OTHER): Payer: BC Managed Care – PPO | Admitting: Radiology

## 2022-11-15 ENCOUNTER — Emergency Department (HOSPITAL_BASED_OUTPATIENT_CLINIC_OR_DEPARTMENT_OTHER): Payer: BC Managed Care – PPO

## 2022-11-15 ENCOUNTER — Encounter (HOSPITAL_BASED_OUTPATIENT_CLINIC_OR_DEPARTMENT_OTHER): Payer: Self-pay

## 2022-11-15 ENCOUNTER — Other Ambulatory Visit: Payer: Self-pay

## 2022-11-15 ENCOUNTER — Inpatient Hospital Stay (HOSPITAL_BASED_OUTPATIENT_CLINIC_OR_DEPARTMENT_OTHER)
Admission: EM | Admit: 2022-11-15 | Discharge: 2022-11-21 | DRG: 562 | Disposition: A | Discharge status: Rehabilitation Facility | Payer: BC Managed Care – PPO | Attending: General Surgery | Admitting: General Surgery

## 2022-11-15 DIAGNOSIS — F17210 Nicotine dependence, cigarettes, uncomplicated: Secondary | ICD-10-CM | POA: Diagnosis present

## 2022-11-15 DIAGNOSIS — T402X5A Adverse effect of other opioids, initial encounter: Secondary | ICD-10-CM | POA: Diagnosis not present

## 2022-11-15 DIAGNOSIS — Z807 Family history of other malignant neoplasms of lymphoid, hematopoietic and related tissues: Secondary | ICD-10-CM

## 2022-11-15 DIAGNOSIS — Z79899 Other long term (current) drug therapy: Secondary | ICD-10-CM | POA: Diagnosis not present

## 2022-11-15 DIAGNOSIS — F1090 Alcohol use, unspecified, uncomplicated: Secondary | ICD-10-CM | POA: Diagnosis not present

## 2022-11-15 DIAGNOSIS — S199XXA Unspecified injury of neck, initial encounter: Secondary | ICD-10-CM | POA: Diagnosis not present

## 2022-11-15 DIAGNOSIS — Z87820 Personal history of traumatic brain injury: Secondary | ICD-10-CM | POA: Diagnosis not present

## 2022-11-15 DIAGNOSIS — S0232XA Fracture of orbital floor, left side, initial encounter for closed fracture: Secondary | ICD-10-CM | POA: Diagnosis not present

## 2022-11-15 DIAGNOSIS — S32402D Unspecified fracture of left acetabulum, subsequent encounter for fracture with routine healing: Secondary | ICD-10-CM | POA: Diagnosis not present

## 2022-11-15 DIAGNOSIS — S065XAD Traumatic subdural hemorrhage with loss of consciousness status unknown, subsequent encounter: Secondary | ICD-10-CM | POA: Diagnosis not present

## 2022-11-15 DIAGNOSIS — Y92410 Unspecified street and highway as the place of occurrence of the external cause: Secondary | ICD-10-CM | POA: Diagnosis not present

## 2022-11-15 DIAGNOSIS — S0240FA Zygomatic fracture, left side, initial encounter for closed fracture: Secondary | ICD-10-CM | POA: Diagnosis present

## 2022-11-15 DIAGNOSIS — Y908 Blood alcohol level of 240 mg/100 ml or more: Secondary | ICD-10-CM | POA: Diagnosis present

## 2022-11-15 DIAGNOSIS — S82292A Other fracture of shaft of left tibia, initial encounter for closed fracture: Secondary | ICD-10-CM | POA: Diagnosis not present

## 2022-11-15 DIAGNOSIS — S80211A Abrasion, right knee, initial encounter: Secondary | ICD-10-CM | POA: Diagnosis present

## 2022-11-15 DIAGNOSIS — D696 Thrombocytopenia, unspecified: Secondary | ICD-10-CM | POA: Diagnosis not present

## 2022-11-15 DIAGNOSIS — S065XAA Traumatic subdural hemorrhage with loss of consciousness status unknown, initial encounter: Secondary | ICD-10-CM | POA: Diagnosis not present

## 2022-11-15 DIAGNOSIS — S82142D Displaced bicondylar fracture of left tibia, subsequent encounter for closed fracture with routine healing: Secondary | ICD-10-CM | POA: Diagnosis not present

## 2022-11-15 DIAGNOSIS — Z83438 Family history of other disorder of lipoprotein metabolism and other lipidemia: Secondary | ICD-10-CM | POA: Diagnosis not present

## 2022-11-15 DIAGNOSIS — I619 Nontraumatic intracerebral hemorrhage, unspecified: Secondary | ICD-10-CM | POA: Diagnosis present

## 2022-11-15 DIAGNOSIS — S5012XA Contusion of left forearm, initial encounter: Secondary | ICD-10-CM | POA: Diagnosis present

## 2022-11-15 DIAGNOSIS — S0232XD Fracture of orbital floor, left side, subsequent encounter for fracture with routine healing: Secondary | ICD-10-CM | POA: Diagnosis not present

## 2022-11-15 DIAGNOSIS — S0230XA Fracture of orbital floor, unspecified side, initial encounter for closed fracture: Secondary | ICD-10-CM

## 2022-11-15 DIAGNOSIS — S32402A Unspecified fracture of left acetabulum, initial encounter for closed fracture: Secondary | ICD-10-CM | POA: Diagnosis present

## 2022-11-15 DIAGNOSIS — S32492A Other specified fracture of left acetabulum, initial encounter for closed fracture: Secondary | ICD-10-CM | POA: Diagnosis not present

## 2022-11-15 DIAGNOSIS — S065X0A Traumatic subdural hemorrhage without loss of consciousness, initial encounter: Secondary | ICD-10-CM | POA: Diagnosis not present

## 2022-11-15 DIAGNOSIS — K5903 Drug induced constipation: Secondary | ICD-10-CM | POA: Diagnosis not present

## 2022-11-15 DIAGNOSIS — S51812A Laceration without foreign body of left forearm, initial encounter: Secondary | ICD-10-CM | POA: Diagnosis not present

## 2022-11-15 DIAGNOSIS — Z808 Family history of malignant neoplasm of other organs or systems: Secondary | ICD-10-CM

## 2022-11-15 DIAGNOSIS — F1010 Alcohol abuse, uncomplicated: Secondary | ICD-10-CM | POA: Diagnosis not present

## 2022-11-15 DIAGNOSIS — I10 Essential (primary) hypertension: Secondary | ICD-10-CM | POA: Diagnosis present

## 2022-11-15 DIAGNOSIS — Z8249 Family history of ischemic heart disease and other diseases of the circulatory system: Secondary | ICD-10-CM | POA: Diagnosis not present

## 2022-11-15 DIAGNOSIS — R600 Localized edema: Secondary | ICD-10-CM | POA: Diagnosis not present

## 2022-11-15 DIAGNOSIS — F5104 Psychophysiologic insomnia: Secondary | ICD-10-CM | POA: Diagnosis not present

## 2022-11-15 DIAGNOSIS — S82145D Nondisplaced bicondylar fracture of left tibia, subsequent encounter for closed fracture with routine healing: Secondary | ICD-10-CM | POA: Diagnosis not present

## 2022-11-15 DIAGNOSIS — M7989 Other specified soft tissue disorders: Secondary | ICD-10-CM | POA: Diagnosis not present

## 2022-11-15 DIAGNOSIS — S82142A Displaced bicondylar fracture of left tibia, initial encounter for closed fracture: Secondary | ICD-10-CM | POA: Diagnosis not present

## 2022-11-15 DIAGNOSIS — E222 Syndrome of inappropriate secretion of antidiuretic hormone: Secondary | ICD-10-CM | POA: Diagnosis not present

## 2022-11-15 DIAGNOSIS — R5381 Other malaise: Secondary | ICD-10-CM | POA: Diagnosis not present

## 2022-11-15 DIAGNOSIS — Z041 Encounter for examination and observation following transport accident: Secondary | ICD-10-CM | POA: Diagnosis not present

## 2022-11-15 DIAGNOSIS — S82102A Unspecified fracture of upper end of left tibia, initial encounter for closed fracture: Secondary | ICD-10-CM | POA: Diagnosis not present

## 2022-11-15 DIAGNOSIS — S02401D Maxillary fracture, unspecified, subsequent encounter for fracture with routine healing: Secondary | ICD-10-CM | POA: Diagnosis not present

## 2022-11-15 DIAGNOSIS — S0240DA Maxillary fracture, left side, initial encounter for closed fracture: Secondary | ICD-10-CM | POA: Diagnosis present

## 2022-11-15 LAB — CBC WITH DIFFERENTIAL/PLATELET
Abs Immature Granulocytes: 0.02 10*3/uL (ref 0.00–0.07)
Basophils Absolute: 0 10*3/uL (ref 0.0–0.1)
Basophils Relative: 0 %
Eosinophils Absolute: 0 10*3/uL (ref 0.0–0.5)
Eosinophils Relative: 1 %
HCT: 34.5 % — ABNORMAL LOW (ref 39.0–52.0)
Hemoglobin: 11.9 g/dL — ABNORMAL LOW (ref 13.0–17.0)
Immature Granulocytes: 1 %
Lymphocytes Relative: 24 %
Lymphs Abs: 0.7 10*3/uL (ref 0.7–4.0)
MCH: 34.8 pg — ABNORMAL HIGH (ref 26.0–34.0)
MCHC: 34.5 g/dL (ref 30.0–36.0)
MCV: 100.9 fL — ABNORMAL HIGH (ref 80.0–100.0)
Monocytes Absolute: 0.3 10*3/uL (ref 0.1–1.0)
Monocytes Relative: 11 %
Neutro Abs: 2 10*3/uL (ref 1.7–7.7)
Neutrophils Relative %: 63 %
Platelets: 82 10*3/uL — ABNORMAL LOW (ref 150–400)
RBC: 3.42 MIL/uL — ABNORMAL LOW (ref 4.22–5.81)
RDW: 13.2 % (ref 11.5–15.5)
WBC: 3.1 10*3/uL — ABNORMAL LOW (ref 4.0–10.5)
nRBC: 0 % (ref 0.0–0.2)

## 2022-11-15 LAB — COMPREHENSIVE METABOLIC PANEL
ALT: 21 U/L (ref 0–44)
AST: 45 U/L — ABNORMAL HIGH (ref 15–41)
Albumin: 4.2 g/dL (ref 3.5–5.0)
Alkaline Phosphatase: 69 U/L (ref 38–126)
Anion gap: 15 (ref 5–15)
BUN: 9 mg/dL (ref 8–23)
CO2: 23 mmol/L (ref 22–32)
Calcium: 8.7 mg/dL — ABNORMAL LOW (ref 8.9–10.3)
Chloride: 102 mmol/L (ref 98–111)
Creatinine, Ser: 0.66 mg/dL (ref 0.61–1.24)
GFR, Estimated: >60 mL/min (ref >60)
Glucose, Bld: 97 mg/dL (ref 70–99)
Potassium: 3.5 mmol/L (ref 3.5–5.1)
Sodium: 140 mmol/L (ref 135–145)
Total Bilirubin: 0.6 mg/dL (ref 0.3–1.2)
Total Protein: 7.5 g/dL (ref 6.5–8.1)

## 2022-11-15 LAB — PROTIME-INR
INR: 1.2 (ref 0.8–1.2)
Prothrombin Time: 15.4 seconds — ABNORMAL HIGH (ref 11.4–15.2)

## 2022-11-15 LAB — ETHANOL: Alcohol, Ethyl (B): 309 mg/dL (ref <10)

## 2022-11-15 LAB — MRSA NEXT GEN BY PCR, NASAL: MRSA by PCR Next Gen: NOT DETECTED

## 2022-11-15 MED ORDER — METHOCARBAMOL 500 MG PO TABS
500.0000 mg | ORAL_TABLET | Freq: Three times a day (TID) | ORAL | Status: AC
  Administered 2022-11-15 – 2022-11-18 (×8): 500 mg via ORAL
  Filled 2022-11-15 (×8): qty 1

## 2022-11-15 MED ORDER — OXYCODONE HCL 5 MG PO TABS
2.5000 mg | ORAL_TABLET | ORAL | Status: DC | PRN
  Filled 2022-11-15: qty 1

## 2022-11-15 MED ORDER — METHOCARBAMOL 1000 MG/10ML IJ SOLN
500.0000 mg | Freq: Three times a day (TID) | INTRAVENOUS | Status: DC
  Administered 2022-11-16: 500 mg via INTRAVENOUS
  Filled 2022-11-15: qty 500

## 2022-11-15 MED ORDER — ACETAMINOPHEN 325 MG PO TABS
650.0000 mg | ORAL_TABLET | Freq: Once | ORAL | Status: AC
  Administered 2022-11-15: 650 mg via ORAL
  Filled 2022-11-15: qty 2

## 2022-11-15 MED ORDER — POLYETHYLENE GLYCOL 3350 17 G PO PACK: 17.0000 g | PACK | Freq: Every day | ORAL | Status: DC | PRN

## 2022-11-15 MED ORDER — HYDRALAZINE HCL 20 MG/ML IJ SOLN: 10.0000 mg | INTRAMUSCULAR | Status: DC | PRN

## 2022-11-15 MED ORDER — ACETAMINOPHEN 500 MG PO TABS
1000.0000 mg | ORAL_TABLET | Freq: Four times a day (QID) | ORAL | Status: DC
  Administered 2022-11-15 – 2022-11-21 (×21): 1000 mg via ORAL
  Filled 2022-11-15 (×22): qty 2

## 2022-11-15 MED ORDER — DOCUSATE SODIUM 100 MG PO CAPS
100.0000 mg | ORAL_CAPSULE | Freq: Two times a day (BID) | ORAL | Status: DC
  Administered 2022-11-16 – 2022-11-20 (×10): 100 mg via ORAL
  Filled 2022-11-15 (×12): qty 1

## 2022-11-15 MED ORDER — OXYCODONE HCL 5 MG PO TABS
2.5000 mg | ORAL_TABLET | ORAL | Status: DC | PRN
  Administered 2022-11-16 – 2022-11-17 (×3): 10 mg via ORAL
  Administered 2022-11-17: 5 mg via ORAL
  Administered 2022-11-17: 10 mg via ORAL
  Filled 2022-11-15 (×4): qty 2
  Filled 2022-11-15: qty 1

## 2022-11-15 MED ORDER — SODIUM CHLORIDE 0.9 % IV SOLN: INTRAVENOUS | Status: DC

## 2022-11-15 MED ORDER — LORAZEPAM 2 MG/ML IJ SOLN: 1.0000 mg | INTRAMUSCULAR | Status: DC | PRN

## 2022-11-15 MED ORDER — IOHEXOL 300 MG/ML  SOLN
100.0000 mL | Freq: Once | INTRAMUSCULAR | Status: AC | PRN
  Administered 2022-11-15: 85 mL via INTRAVENOUS

## 2022-11-15 MED ORDER — MORPHINE SULFATE (PF) 2 MG/ML IV SOLN
1.0000 mg | INTRAVENOUS | Status: DC | PRN
  Administered 2022-11-15 – 2022-11-16 (×5): 2 mg via INTRAVENOUS
  Filled 2022-11-15 (×5): qty 1

## 2022-11-15 MED ORDER — METOPROLOL TARTRATE 5 MG/5ML IV SOLN: 5.0000 mg | Freq: Four times a day (QID) | INTRAVENOUS | Status: DC | PRN

## 2022-11-15 NOTE — ED Notes (Signed)
Called [facility] -- informed that pt bed assignment is ready 
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
## 2022-11-15 NOTE — ED Notes (Signed)
Patient transported to X-ray and CT 

## 2022-11-15 NOTE — ED Notes (Signed)
Pt has urinal at bedside 

## 2022-11-16 LAB — CBC
HCT: 31.6 % — ABNORMAL LOW (ref 39.0–52.0)
Hemoglobin: 10.7 g/dL — ABNORMAL LOW (ref 13.0–17.0)
MCH: 34.2 pg — ABNORMAL HIGH (ref 26.0–34.0)
MCHC: 33.9 g/dL (ref 30.0–36.0)
MCV: 101 fL — ABNORMAL HIGH (ref 80.0–100.0)
Platelets: 71 10*3/uL — ABNORMAL LOW (ref 150–400)
RBC: 3.13 MIL/uL — ABNORMAL LOW (ref 4.22–5.81)
RDW: 13.2 % (ref 11.5–15.5)
WBC: 4.2 10*3/uL (ref 4.0–10.5)
nRBC: 0 % (ref 0.0–0.2)

## 2022-11-16 LAB — BASIC METABOLIC PANEL
Anion gap: 14 (ref 5–15)
BUN: 11 mg/dL (ref 8–23)
CO2: 22 mmol/L (ref 22–32)
Calcium: 8.7 mg/dL — ABNORMAL LOW (ref 8.9–10.3)
Chloride: 100 mmol/L (ref 98–111)
Creatinine, Ser: 0.72 mg/dL (ref 0.61–1.24)
GFR, Estimated: >60 mL/min (ref >60)
Glucose, Bld: 131 mg/dL — ABNORMAL HIGH (ref 70–99)
Potassium: 3.4 mmol/L — ABNORMAL LOW (ref 3.5–5.1)
Sodium: 136 mmol/L (ref 135–145)

## 2022-11-16 MED ORDER — ORAL CARE MOUTH RINSE: 15.0000 mL | OROMUCOSAL | Status: DC | PRN

## 2022-11-16 MED ORDER — PHENOBARBITAL 16.2 MG PO TABS
32.4000 mg | ORAL_TABLET | Freq: Three times a day (TID) | ORAL | Status: DC
  Administered 2022-11-20 – 2022-11-21 (×4): 32.4 mg via ORAL
  Filled 2022-11-16 (×5): qty 2

## 2022-11-16 MED ORDER — PHENOBARBITAL 97.2 MG PO TABS
97.2000 mg | ORAL_TABLET | Freq: Three times a day (TID) | ORAL | Status: AC
  Administered 2022-11-16 – 2022-11-18 (×6): 97.2 mg via ORAL
  Filled 2022-11-16 (×4): qty 3
  Filled 2022-11-16 (×2): qty 1

## 2022-11-16 MED ORDER — LEVETIRACETAM 500 MG PO TABS
500.0000 mg | ORAL_TABLET | Freq: Two times a day (BID) | ORAL | Status: DC
  Administered 2022-11-16 – 2022-11-21 (×11): 500 mg via ORAL
  Filled 2022-11-16 (×11): qty 1

## 2022-11-16 MED ORDER — ONDANSETRON HCL 4 MG/2ML IJ SOLN
4.0000 mg | Freq: Four times a day (QID) | INTRAMUSCULAR | Status: DC | PRN
  Administered 2022-11-16: 4 mg via INTRAVENOUS
  Filled 2022-11-16: qty 2

## 2022-11-16 MED ORDER — ADULT MULTIVITAMIN W/MINERALS CH
1.0000 | ORAL_TABLET | Freq: Every day | ORAL | Status: DC
  Administered 2022-11-16 – 2022-11-21 (×6): 1 via ORAL
  Filled 2022-11-16 (×6): qty 1

## 2022-11-16 MED ORDER — LORAZEPAM 1 MG PO TABS: 1.0000 mg | ORAL_TABLET | ORAL | Status: DC | PRN

## 2022-11-16 MED ORDER — FOLIC ACID 1 MG PO TABS
1.0000 mg | ORAL_TABLET | Freq: Every day | ORAL | Status: DC
  Administered 2022-11-16 – 2022-11-21 (×6): 1 mg via ORAL
  Filled 2022-11-16 (×6): qty 1

## 2022-11-16 MED ORDER — CHLORHEXIDINE GLUCONATE CLOTH 2 % EX PADS
6.0000 | MEDICATED_PAD | Freq: Every day | CUTANEOUS | Status: DC
  Administered 2022-11-16 – 2022-11-21 (×5): 6 via TOPICAL

## 2022-11-16 MED ORDER — THIAMINE MONONITRATE 100 MG PO TABS
100.0000 mg | ORAL_TABLET | Freq: Every day | ORAL | Status: DC
  Administered 2022-11-16 – 2022-11-21 (×6): 100 mg via ORAL
  Filled 2022-11-16 (×6): qty 1

## 2022-11-16 MED ORDER — PHENOBARBITAL 16.2 MG PO TABS
64.8000 mg | ORAL_TABLET | Freq: Three times a day (TID) | ORAL | Status: AC
  Administered 2022-11-18 – 2022-11-20 (×6): 64.8 mg via ORAL
  Filled 2022-11-16: qty 1
  Filled 2022-11-16 (×5): qty 4
  Filled 2022-11-16: qty 1

## 2022-11-16 NOTE — Consult Note (Signed)
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     Conjunctiva/sclera: Conjunctivae normal.  Cardiovascular:     Rate and Rhythm: Normal rate and regular rhythm.  Pulmonary:     Effort: Pulmonary effort is normal. No respiratory distress.  Musculoskeletal:     Cervical back: Normal range of motion.     Comments: LLE No traumatic wounds or rash, knee ecchymotic  Mod TTP knee  Mod knee effusion  Sens DPN, SPN, TN intact  Motor EHL, ext, flex, evers 5/5  DP 2+, PT 2+, No significant edema  Skin:    General: Skin is warm and dry.  Neurological:     Mental Status: He is alert.  Psychiatric:        Mood and Affect: Mood normal.        Behavior: Behavior normal.    Assessment/Plan: Left acetabulum fx -- Plan non-operative treatment with NWB LLE. Left tibia plateau fx -- Plan non-operative treatment with KI and NWB. F/u with Dr. Carola Frost in 2 weeks. Right humerus fx s/p ORIF -- Ok for WBAT at this point    Freeman Caldron, PA-C Orthopedic Surgery [phone] 11/16/2022, 9:53 AM
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
## 2022-11-16 NOTE — Progress Notes (Signed)
  Inpatient Rehab Admissions Coordinator :  Per therapy recommendations, patient was screened for CIR candidacy by Ottie Glazier RN MSN. Noted recent CIR admit and discharge home with wife at supervision level 10/22/22. There is a questions that he is separated now and caregiver supports will need to be clarified.  At this time patient appears to be a potential candidate for CIR. I will place a rehab consult per protocol for full assessment. Please call me with any questions.  Ottie Glazier RN MSN Admissions Coordinator [phone]
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
## 2022-11-16 NOTE — Progress Notes (Signed)
Patient well-known to me, I was actually is planning on shortening one of the humeral pegs that looks like it has slightly penetrated through the articular surface of his shoulder open reduction internal fixation which I did about 6 weeks ago, but then unfortunately now he has had this new trauma.  I appreciate the trauma services management, he has a new left tibial plateau fracture that he should be nonweightbearing for, I do not think he needs surgery for that.  He can weight-bear as tolerated on his right upper extremity.  We will hold off on any further intervention on the shoulder until he stabilizes clinically.  He is okay for gentle active motion of the shoulder, but I would not get too aggressive.  Will follow.  Teryl Lucy, MD
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
## 2022-11-17 ENCOUNTER — Ambulatory Visit: Payer: BC Managed Care – PPO

## 2022-11-17 ENCOUNTER — Ambulatory Visit: Payer: BC Managed Care – PPO | Admitting: Occupational Therapy
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
## 2022-11-17 NOTE — Plan of Care (Signed)
  Problem: Education: Goal: Knowledge of General Education information will improve Description: Including pain rating scale, medication(s)/side effects and non-pharmacologic comfort measures Outcome: Progressing   Problem: Clinical Measurements: Goal: Will remain free from infection Outcome: Progressing   Problem: Activity: Goal: Risk for activity intolerance will decrease Outcome: Progressing   Problem: Elimination: Goal: Will not experience complications related to bowel motility Outcome: Progressing   Problem: Pain Managment: Goal: General experience of comfort will improve Outcome: Progressing   
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
## 2022-11-17 NOTE — Discharge Instructions (Signed)
No bearing weight on left leg. Keep knee immobilizer in place at all times until follow up with Dr. Dion Saucier.

## 2022-11-17 NOTE — TOC Progression Note (Signed)
Transition of Care [facility]) - Progression Note    Patient Details  Name: Richard Andrews MRN: [id number] Date of Birth: [birth year]  Transition of Care [facility]) CM/SW Contact  Astrid Drafts Berna Spare, RN Phone Number: 11/17/2022, 4:40 PM  Clinical Narrative:    CIR consult in process; rehab [facility] has opened insurance auth with [employer] today.  Patient's family available to assist with needed 24h assistance at dc.    Expected Discharge Plan: IP Rehab Facility Barriers to Discharge: Continued Medical Work up, Conservator, museum/gallery and Services   Discharge Planning Services: CM Consult   Living arrangements for the past 2 months: Single Family Home                                       Social Determinants of Health (SDOH) Interventions SDOH Screenings   Depression (PHQ2-9): Low Risk  (02/01/2021)  Tobacco Use: High Risk (11/15/2022)    Readmission Risk Interventions     No data to display         Quintella Baton, RN, BSN  Trauma/Neuro ICU Case Manager [phone]

## 2022-11-17 NOTE — Progress Notes (Signed)
Patient ID: Richard Andrews, male   DOB: 1960-09-25, 61 y.o.   MRN: [id number] Vital signs are stable Patient is alert and oriented. He has the ecchymosis about his left eye Apparently is old Niccoli stable and he may be anticoagulated for purposes of DVT prophylaxis.  Follow his CT up on an outpatient basis in about 2 weeks.  Will sign off at this time unless there is other neurosurgical issues to be addressed.
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
## 2022-11-17 NOTE — Plan of Care (Signed)

## 2022-11-18 LAB — CBC
HCT: 28.2 % — ABNORMAL LOW (ref 39.0–52.0)
Hemoglobin: 9.4 g/dL — ABNORMAL LOW (ref 13.0–17.0)
MCH: 34.7 pg — ABNORMAL HIGH (ref 26.0–34.0)
MCHC: 33.3 g/dL (ref 30.0–36.0)
MCV: 104.1 fL — ABNORMAL HIGH (ref 80.0–100.0)
Platelets: 69 10*3/uL — ABNORMAL LOW (ref 150–400)
RBC: 2.71 MIL/uL — ABNORMAL LOW (ref 4.22–5.81)
RDW: 12.7 % (ref 11.5–15.5)
WBC: 3.9 10*3/uL — ABNORMAL LOW (ref 4.0–10.5)
nRBC: 0 % (ref 0.0–0.2)

## 2022-11-18 MED ORDER — HEPARIN SODIUM (PORCINE) 5000 UNIT/ML IJ SOLN
5000.0000 [IU] | Freq: Two times a day (BID) | INTRAMUSCULAR | Status: DC
  Administered 2022-11-18 – 2022-11-21 (×6): 5000 [IU] via SUBCUTANEOUS
  Filled 2022-11-18 (×6): qty 1

## 2022-11-18 MED ORDER — OXYCODONE HCL 5 MG PO TABS
5.0000 mg | ORAL_TABLET | ORAL | Status: DC | PRN
  Administered 2022-11-19 – 2022-11-21 (×6): 10 mg via ORAL
  Filled 2022-11-18 (×6): qty 2

## 2022-11-18 MED ORDER — POLYETHYLENE GLYCOL 3350 17 G PO PACK
17.0000 g | PACK | Freq: Two times a day (BID) | ORAL | Status: DC
  Administered 2022-11-18 – 2022-11-20 (×6): 17 g via ORAL
  Filled 2022-11-18 (×7): qty 1

## 2022-11-18 NOTE — Plan of Care (Signed)
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
## 2022-11-18 NOTE — Progress Notes (Signed)
Inpatient Rehab Admissions Coordinator:    I continue to await insurance auth for potential CIR admit. Case sumbitted yesterday.   Megan Salon, MS, CCC-SLP Rehab Admissions Coordinator  [phone] (celll) [phone] (office)
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
## 2022-11-19 MED ORDER — BISACODYL 10 MG RE SUPP: 10.0000 mg | Freq: Every day | RECTAL | Status: DC | PRN
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
## 2022-11-20 MED ORDER — MAGNESIUM HYDROXIDE 400 MG/5ML PO SUSP
30.0000 mL | Freq: Once | ORAL | Status: DC
  Filled 2022-11-20: qty 30
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
## 2022-11-20 NOTE — Plan of Care (Signed)

## 2022-11-20 NOTE — Plan of Care (Signed)
  Problem: Activity: Goal: Risk for activity intolerance will decrease Outcome: Not Progressing   Problem: Elimination: Goal: Will not experience complications related to bowel motility Outcome: Not Progressing   Problem: Safety: Goal: Ability to remain free from injury will improve Outcome: Not Progressing   
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
## 2022-11-21 ENCOUNTER — Encounter (HOSPITAL_COMMUNITY): Payer: Self-pay | Admitting: Physical Medicine and Rehabilitation

## 2022-11-21 ENCOUNTER — Other Ambulatory Visit: Payer: Self-pay

## 2022-11-21 ENCOUNTER — Inpatient Hospital Stay (HOSPITAL_COMMUNITY)
Admission: RE | Admit: 2022-11-21 | Discharge: 2022-11-29 | DRG: 560 | Disposition: A | Discharge status: Home / Self Care | Payer: BC Managed Care – PPO | Source: Intra-hospital | Attending: Physical Medicine and Rehabilitation | Admitting: Physical Medicine and Rehabilitation

## 2022-11-21 DIAGNOSIS — Y92410 Unspecified street and highway as the place of occurrence of the external cause: Secondary | ICD-10-CM

## 2022-11-21 DIAGNOSIS — Z79899 Other long term (current) drug therapy: Secondary | ICD-10-CM | POA: Diagnosis not present

## 2022-11-21 DIAGNOSIS — K5903 Drug induced constipation: Secondary | ICD-10-CM | POA: Diagnosis not present

## 2022-11-21 DIAGNOSIS — S82142D Displaced bicondylar fracture of left tibia, subsequent encounter for closed fracture with routine healing: Secondary | ICD-10-CM | POA: Diagnosis not present

## 2022-11-21 DIAGNOSIS — R5381 Other malaise: Secondary | ICD-10-CM | POA: Diagnosis not present

## 2022-11-21 DIAGNOSIS — S065XAD Traumatic subdural hemorrhage with loss of consciousness status unknown, subsequent encounter: Secondary | ICD-10-CM

## 2022-11-21 DIAGNOSIS — I10 Essential (primary) hypertension: Secondary | ICD-10-CM | POA: Diagnosis not present

## 2022-11-21 DIAGNOSIS — M79609 Pain in unspecified limb: Secondary | ICD-10-CM | POA: Diagnosis not present

## 2022-11-21 DIAGNOSIS — S82145D Nondisplaced bicondylar fracture of left tibia, subsequent encounter for closed fracture with routine healing: Secondary | ICD-10-CM | POA: Diagnosis not present

## 2022-11-21 DIAGNOSIS — E222 Syndrome of inappropriate secretion of antidiuretic hormone: Secondary | ICD-10-CM | POA: Diagnosis present

## 2022-11-21 DIAGNOSIS — T402X5A Adverse effect of other opioids, initial encounter: Secondary | ICD-10-CM | POA: Diagnosis not present

## 2022-11-21 DIAGNOSIS — F17210 Nicotine dependence, cigarettes, uncomplicated: Secondary | ICD-10-CM | POA: Diagnosis present

## 2022-11-21 DIAGNOSIS — S02401D Maxillary fracture, unspecified, subsequent encounter for fracture with routine healing: Secondary | ICD-10-CM

## 2022-11-21 DIAGNOSIS — F5104 Psychophysiologic insomnia: Secondary | ICD-10-CM | POA: Diagnosis present

## 2022-11-21 DIAGNOSIS — S32402A Unspecified fracture of left acetabulum, initial encounter for closed fracture: Secondary | ICD-10-CM | POA: Diagnosis not present

## 2022-11-21 DIAGNOSIS — F1020 Alcohol dependence, uncomplicated: Secondary | ICD-10-CM | POA: Diagnosis not present

## 2022-11-21 DIAGNOSIS — S0232XD Fracture of orbital floor, left side, subsequent encounter for fracture with routine healing: Secondary | ICD-10-CM

## 2022-11-21 DIAGNOSIS — S32402D Unspecified fracture of left acetabulum, subsequent encounter for fracture with routine healing: Secondary | ICD-10-CM | POA: Diagnosis not present

## 2022-11-21 DIAGNOSIS — Z83438 Family history of other disorder of lipoprotein metabolism and other lipidemia: Secondary | ICD-10-CM | POA: Diagnosis not present

## 2022-11-21 DIAGNOSIS — Y908 Blood alcohol level of 240 mg/100 ml or more: Secondary | ICD-10-CM | POA: Diagnosis present

## 2022-11-21 DIAGNOSIS — Z8249 Family history of ischemic heart disease and other diseases of the circulatory system: Secondary | ICD-10-CM

## 2022-11-21 DIAGNOSIS — K5900 Constipation, unspecified: Secondary | ICD-10-CM | POA: Diagnosis not present

## 2022-11-21 DIAGNOSIS — F1090 Alcohol use, unspecified, uncomplicated: Secondary | ICD-10-CM | POA: Diagnosis present

## 2022-11-21 DIAGNOSIS — S82145S Nondisplaced bicondylar fracture of left tibia, sequela: Secondary | ICD-10-CM | POA: Diagnosis not present

## 2022-11-21 DIAGNOSIS — S82145A Nondisplaced bicondylar fracture of left tibia, initial encounter for closed fracture: Secondary | ICD-10-CM | POA: Diagnosis present

## 2022-11-21 DIAGNOSIS — M79605 Pain in left leg: Secondary | ICD-10-CM | POA: Diagnosis not present

## 2022-11-21 LAB — CBC
HCT: 29.9 % — ABNORMAL LOW (ref 39.0–52.0)
Hemoglobin: 10.2 g/dL — ABNORMAL LOW (ref 13.0–17.0)
MCH: 35.3 pg — ABNORMAL HIGH (ref 26.0–34.0)
MCHC: 34.1 g/dL (ref 30.0–36.0)
MCV: 103.5 fL — ABNORMAL HIGH (ref 80.0–100.0)
Platelets: 112 10*3/uL — ABNORMAL LOW (ref 150–400)
RBC: 2.89 MIL/uL — ABNORMAL LOW (ref 4.22–5.81)
RDW: 13.2 % (ref 11.5–15.5)
WBC: 3.3 10*3/uL — ABNORMAL LOW (ref 4.0–10.5)
nRBC: 0 % (ref 0.0–0.2)

## 2022-11-21 LAB — CREATININE, SERUM
Creatinine, Ser: 0.77 mg/dL (ref 0.61–1.24)
GFR, Estimated: >60 mL/min (ref >60)

## 2022-11-21 MED ORDER — ACETAMINOPHEN 325 MG PO TABS
325.0000 mg | ORAL_TABLET | ORAL | Status: DC | PRN
  Administered 2022-11-22 – 2022-11-27 (×11): 650 mg via ORAL
  Filled 2022-11-21 (×12): qty 2

## 2022-11-21 MED ORDER — PHENOBARBITAL 32.4 MG PO TABS
32.4000 mg | ORAL_TABLET | Freq: Three times a day (TID) | ORAL | Status: AC
  Administered 2022-11-21 – 2022-11-22 (×2): 32.4 mg via ORAL
  Filled 2022-11-21 (×2): qty 1

## 2022-11-21 MED ORDER — HEPARIN SODIUM (PORCINE) 5000 UNIT/ML IJ SOLN: 5000.0000 [IU] | Freq: Three times a day (TID) | INTRAMUSCULAR | Status: DC

## 2022-11-21 MED ORDER — OXYCODONE HCL 5 MG PO TABS
5.0000 mg | ORAL_TABLET | ORAL | Status: DC | PRN
  Administered 2022-11-22: 10 mg via ORAL
  Administered 2022-11-22: 5 mg via ORAL
  Administered 2022-11-22: 10 mg via ORAL
  Administered 2022-11-22: 5 mg via ORAL
  Administered 2022-11-22 – 2022-11-24 (×5): 10 mg via ORAL
  Administered 2022-11-24: 5 mg via ORAL
  Administered 2022-11-24 – 2022-11-25 (×4): 10 mg via ORAL
  Administered 2022-11-25: 5 mg via ORAL
  Administered 2022-11-25 – 2022-11-28 (×11): 10 mg via ORAL
  Filled 2022-11-21 (×16): qty 2
  Filled 2022-11-21: qty 1
  Filled 2022-11-21 (×4): qty 2
  Filled 2022-11-21: qty 1
  Filled 2022-11-21 (×4): qty 2

## 2022-11-21 MED ORDER — HEPARIN SODIUM (PORCINE) 5000 UNIT/ML IJ SOLN
5000.0000 [IU] | Freq: Two times a day (BID) | INTRAMUSCULAR | Status: DC
  Administered 2022-11-21 – 2022-11-29 (×16): 5000 [IU] via SUBCUTANEOUS
  Filled 2022-11-21 (×16): qty 1

## 2022-11-21 MED ORDER — THIAMINE MONONITRATE 100 MG PO TABS
100.0000 mg | ORAL_TABLET | Freq: Every day | ORAL | Status: DC
  Administered 2022-11-22 – 2022-11-29 (×8): 100 mg via ORAL
  Filled 2022-11-21 (×8): qty 1

## 2022-11-21 MED ORDER — LEVETIRACETAM 500 MG PO TABS
500.0000 mg | ORAL_TABLET | Freq: Two times a day (BID) | ORAL | Status: AC
  Administered 2022-11-21 – 2022-11-22 (×3): 500 mg via ORAL
  Filled 2022-11-21 (×3): qty 1

## 2022-11-21 MED ORDER — FOLIC ACID 1 MG PO TABS
1.0000 mg | ORAL_TABLET | Freq: Every day | ORAL | Status: DC
  Administered 2022-11-22 – 2022-11-29 (×8): 1 mg via ORAL
  Filled 2022-11-21 (×8): qty 1

## 2022-11-21 MED ORDER — BISACODYL 10 MG RE SUPP: 10.0000 mg | Freq: Every day | RECTAL | Status: DC | PRN

## 2022-11-21 MED ORDER — ADULT MULTIVITAMIN W/MINERALS CH
1.0000 | ORAL_TABLET | Freq: Every day | ORAL | Status: DC
  Administered 2022-11-22 – 2022-11-29 (×8): 1 via ORAL
  Filled 2022-11-21 (×8): qty 1

## 2022-11-21 MED ORDER — DOCUSATE SODIUM 100 MG PO CAPS
100.0000 mg | ORAL_CAPSULE | Freq: Two times a day (BID) | ORAL | Status: DC
  Administered 2022-11-21 – 2022-11-29 (×16): 100 mg via ORAL
  Filled 2022-11-21 (×16): qty 1

## 2022-11-21 MED ORDER — POLYETHYLENE GLYCOL 3350 17 G PO PACK
17.0000 g | PACK | Freq: Two times a day (BID) | ORAL | Status: DC
  Administered 2022-11-21 – 2022-11-28 (×11): 17 g via ORAL
  Filled 2022-11-21 (×13): qty 1

## 2022-11-21 NOTE — Progress Notes (Signed)
Inpatient Rehab Admissions Coordinator:    I have a CIR bed for this Pt. Today. RN may call report to [phone].  Pt. In agreement to admit to [name] for intensive rehab program for an estimated 12-14 days with the goal of reaching supervision level and discharging home with BIL, Freddie.  Megan Salon, MS, CCC-SLP Rehab Admissions Coordinator  [phone] (celll) [phone] (office)

## 2022-11-21 NOTE — Progress Notes (Signed)
Inpatient Rehab Admissions Coordinator:    I continue to await insurance auth. I spoke with case manager at [facility] and she's hopeful for a decision by end of day today.   Megan Salon, MS, CCC-SLP Rehab Admissions Coordinator  [phone] (celll) [phone] (office)

## 2022-11-21 NOTE — Progress Notes (Signed)
Patient ID: Richard Andrews, male   DOB: 09-06-1960, 62 y.o.   MRN: [id number]  Patient arrived to unit by bed nurse received room and check in process completed
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
## 2022-11-21 NOTE — Plan of Care (Signed)
  Problem: Activity: Goal: Risk for activity intolerance will decrease Outcome: Not Progressing   Problem: Pain Managment: Goal: General experience of comfort will improve Outcome: Not Progressing   

## 2022-11-21 NOTE — TOC Transition Note (Signed)
Transition of Care [facility]) - CM/SW Discharge Note   Patient Details  Name: Richard Andrews MRN: [id number] Date of Birth: [birth year]  [facility]) CM/SW Contact:  Glennon Mac, RN Phone Number: 11/21/2022, 4:38 PM   Clinical Narrative:    Patient medically stable for discharge today, and insurance authorization has been received for admission to inpatient rehab.  Plan discharge to [facility] when bed ready.   Final next level of care: IP Rehab Facility Barriers to Discharge: Barriers Resolved                           Discharge Plan and Services Additional resources added to the After Visit Summary for     Discharge Planning Services: CM Consult                                 Social Determinants of Health (SDOH) Interventions SDOH Screenings   Depression (PHQ2-9): Low Risk  (02/01/2021)  Tobacco Use: High Risk (11/15/2022)     Readmission Risk Interventions     No data to display         Quintella Baton, RN, BSN  Trauma/Neuro ICU Case Manager [phone]
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
## 2022-11-22 DIAGNOSIS — S82145S Nondisplaced bicondylar fracture of left tibia, sequela: Secondary | ICD-10-CM

## 2022-11-22 LAB — CBC WITH DIFFERENTIAL/PLATELET
Abs Immature Granulocytes: 0.01 10*3/uL (ref 0.00–0.07)
Basophils Absolute: 0 10*3/uL (ref 0.0–0.1)
Basophils Relative: 1 %
Eosinophils Absolute: 0.1 10*3/uL (ref 0.0–0.5)
Eosinophils Relative: 4 %
HCT: 27.4 % — ABNORMAL LOW (ref 39.0–52.0)
Hemoglobin: 9.3 g/dL — ABNORMAL LOW (ref 13.0–17.0)
Immature Granulocytes: 0 %
Lymphocytes Relative: 34 %
Lymphs Abs: 1 10*3/uL (ref 0.7–4.0)
MCH: 34.2 pg — ABNORMAL HIGH (ref 26.0–34.0)
MCHC: 33.9 g/dL (ref 30.0–36.0)
MCV: 100.7 fL — ABNORMAL HIGH (ref 80.0–100.0)
Monocytes Absolute: 0.7 10*3/uL (ref 0.1–1.0)
Monocytes Relative: 24 %
Neutro Abs: 1.2 10*3/uL — ABNORMAL LOW (ref 1.7–7.7)
Neutrophils Relative %: 37 %
Platelets: 114 10*3/uL — ABNORMAL LOW (ref 150–400)
RBC: 2.72 MIL/uL — ABNORMAL LOW (ref 4.22–5.81)
RDW: 13.3 % (ref 11.5–15.5)
WBC: 3 10*3/uL — ABNORMAL LOW (ref 4.0–10.5)
nRBC: 0 % (ref 0.0–0.2)

## 2022-11-22 LAB — COMPREHENSIVE METABOLIC PANEL
ALT: 22 U/L (ref 0–44)
AST: 39 U/L (ref 15–41)
Albumin: 3.5 g/dL (ref 3.5–5.0)
Alkaline Phosphatase: 129 U/L — ABNORMAL HIGH (ref 38–126)
Anion gap: 11 (ref 5–15)
BUN: 13 mg/dL (ref 8–23)
CO2: 22 mmol/L (ref 22–32)
Calcium: 9.1 mg/dL (ref 8.9–10.3)
Chloride: 99 mmol/L (ref 98–111)
Creatinine, Ser: 0.87 mg/dL (ref 0.61–1.24)
GFR, Estimated: >60 mL/min (ref >60)
Glucose, Bld: 97 mg/dL (ref 70–99)
Potassium: 3.9 mmol/L (ref 3.5–5.1)
Sodium: 132 mmol/L — ABNORMAL LOW (ref 135–145)
Total Bilirubin: 0.7 mg/dL (ref 0.3–1.2)
Total Protein: 7.3 g/dL (ref 6.5–8.1)

## 2022-11-22 LAB — OSMOLALITY: Osmolality: 283 mOsm/kg (ref 275–295)

## 2022-11-22 LAB — OSMOLALITY, URINE: Osmolality, Ur: 623 mOsm/kg (ref 300–900)

## 2022-11-22 LAB — SODIUM, URINE, RANDOM: Sodium, Ur: 82 mmol/L

## 2022-11-22 MED ORDER — TRAZODONE HCL 50 MG PO TABS: 50.0000 mg | ORAL_TABLET | Freq: Every evening | ORAL | Status: DC | PRN

## 2022-11-22 MED ORDER — GABAPENTIN 300 MG PO CAPS
300.0000 mg | ORAL_CAPSULE | Freq: Every day | ORAL | Status: DC
  Administered 2022-11-22 – 2022-11-28 (×7): 300 mg via ORAL
  Filled 2022-11-22 (×7): qty 1

## 2022-11-22 NOTE — Discharge Instructions (Addendum)
Inpatient Rehab Discharge Instructions  Richard Andrews Discharge date and time: No discharge date for patient encounter.   Activities/Precautions/ Functional Status: Activity: Nonweightbearing left lower extremity with knee immobilizer at all times Diet: regular diet 1200 mL fluid restriction Wound Care: Routine skin checks Functional status:  ___ No restrictions     ___ Walk up steps independently ___ 24/7 supervision/assistance   ___ Walk up steps with assistance ___ Intermittent supervision/assistance  ___ Bathe/dress independently ___ Walk with walker     _x__ Bathe/dress with assistance ___ Walk Independently    ___ Shower independently ___ Walk with assistance    ___ Shower with assistance ___ No alcohol     ___ Return to work/school ________  Special Instructions: No driving smoking or alcohol    COMMUNITY REFERRALS UPON DISCHARGE:    ALL THERAPIES HAVE  BEEN DEFERRED UNTIL WEIGHT BEARING STATUS CHANGES. PLEASE REQUEST AN ORDER FOR THERAPIES FROM ORTHOPEDIC.   Medical Equipment/Items Ordered:Wheelchair                                                 Agency/Supplier: [employer] [phone]     My questions have been answered and I understand these instructions. I will adhere to these goals and the provided educational materials after my discharge from the hospital.  Patient/Caregiver Signature _______________________________ Date __________  Clinician Signature _______________________________________ Date __________  Please bring this form and your medication list with you to all your follow-up doctor's appointments.
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
## 2022-11-22 NOTE — Progress Notes (Signed)
Met with patient. Speech in room. Introduced self, updated board, and informed of team conference today.
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
## 2022-11-22 NOTE — Plan of Care (Signed)
  Problem: RH Problem Solving Goal: LTG Patient will demonstrate problem solving for (SLP) Description: LTG:  Patient will demonstrate problem solving for basic/complex daily situations with cues  (SLP) Flowsheets (Taken 11/22/2022 1309) LTG: Patient will demonstrate problem solving for (SLP): Complex daily situations LTG Patient will demonstrate problem solving for: Supervision   Problem: RH Memory Goal: LTG Patient will demonstrate ability for day to day (SLP) Description: LTG:   Patient will demonstrate ability for day to day recall/carryover during cognitive/linguistic activities with assist  (SLP) Flowsheets (Taken 11/22/2022 1309) LTG: Patient will demonstrate ability for day to day recall: New information LTG: Patient will demonstrate ability for day to day recall/carryover during cognitive/linguistic activities with assist (SLP): Supervision Goal: LTG Patient will use memory compensatory aids to (SLP) Description: LTG:  Patient will use memory compensatory aids to recall biographical/new, daily complex information with cues (SLP) Flowsheets (Taken 11/22/2022 1309) LTG: Patient will use memory compensatory aids to (SLP): Supervision

## 2022-11-22 NOTE — Progress Notes (Signed)
Inpatient Rehabilitation Admission Medication Review by a Pharmacist   A complete drug regimen review was completed for this patient to identify any potential clinically significant medication issues.   High Risk Drug Classes Is patient taking? Indication by Medication  Antipsychotic No    Anticoagulant Yes Heparin SQ - VTE prophylaxi  Antibiotic No    Opioid Yes Oxycodone - moderate/severe pain  Antiplatelet No    Hypoglycemics/insulin No    Vasoactive Medication No    Chemotherapy No    Other Yes Docusate, Miralax - constipation Folic Acid, multivitamin, thiamine - supplements Keppra x 7 days - seizure prevention Phenobarbital taper - alcohol withdrawal prevention  PRNs: Acetaminophen - mild pain Bisacodyl - constipation        Type of Medication Issue Identified Description of Issue Recommendation(s)  Drug Interaction(s) (clinically significant)        Duplicate Therapy        Allergy        No Medication Administration End Date        Incorrect Dose        Additional Drug Therapy Needed        Significant med changes from prior encounter (inform family/care partners about these prior to discharge). PTA pantoprazole and senokot-S not continued Resume pantoprazole if/when needed. On other laxatives.  Other            Clinically significant medication issues were identified that warrant physician communication and completion of prescribed/recommended actions by midnight of the next day:  No  Pharmacist comments:  - phenobarbital taper completed 6/11 am - keppra x 7 days will end with 6/11 pm dose    Time spent performing this drug regimen review (minutes):  20
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
## 2022-11-22 NOTE — Plan of Care (Signed)
Problem: RH Balance Goal: LTG Patient will maintain dynamic standing balance (PT) Description: LTG:  Patient will maintain dynamic standing balance with assistance during mobility activities (PT) Flowsheets (Taken 11/22/2022 1413) LTG: Pt will maintain dynamic standing balance during mobility activities with:: Supervision/Verbal cueing   Problem: Sit to Stand Goal: LTG:  Patient will perform sit to stand with assistance level (PT) Description: LTG:  Patient will perform sit to stand with assistance level (PT) Flowsheets (Taken 11/22/2022 1413) LTG: PT will perform sit to stand in preparation for functional mobility with assistance level: Supervision/Verbal cueing   Problem: RH Bed Mobility Goal: LTG Patient will perform bed mobility with assist (PT) Description: LTG: Patient will perform bed mobility with assistance, with/without cues (PT). Flowsheets (Taken 11/22/2022 1413) LTG: Pt will perform bed mobility with assistance level of: Independent   Problem: RH Bed to Chair Transfers Goal: LTG Patient will perform bed/chair transfers w/assist (PT) Description: LTG: Patient will perform bed to chair transfers with assistance (PT). Flowsheets (Taken 11/22/2022 1413) LTG: Pt will perform Bed to Chair Transfers with assistance level: Supervision/Verbal cueing   Problem: RH Car Transfers Goal: LTG Patient will perform car transfers with assist (PT) Description: LTG: Patient will perform car transfers with assistance (PT). Flowsheets (Taken 11/22/2022 1413) LTG: Pt will perform car transfers with assist:: Supervision/Verbal cueing   Problem: RH Ambulation Goal: LTG Patient will ambulate in home environment (PT) Description: LTG: Patient will ambulate in home environment, # of feet with assistance (PT). Flowsheets (Taken 11/22/2022 1413) LTG: Pt will ambulate in home environ  assist needed:: Supervision/Verbal cueing LTG: Ambulation distance in home environment: 30'   Problem: RH Wheelchair  Mobility Goal: LTG Patient will propel w/c in community environment (PT) Description: LTG: Patient will propel wheelchair in community environment, # of feet with assist (PT) Flowsheets (Taken 11/22/2022 1413) LTG: Pt will propel w/c in community environ  assist needed:: Independent with assistive device Distance: wheelchair distance in controlled environment: 150

## 2022-11-22 NOTE — Care Management (Signed)
Inpatient Rehabilitation Center Individual Statement of Services  Patient Name:  Richard Andrews  Date:  11/22/2022  Welcome to the Inpatient Rehabilitation Center.  Our goal is to provide you with an individualized program based on your diagnosis and situation, designed to meet your specific needs.  With this comprehensive rehabilitation program, you will be expected to participate in at least 3 hours of rehabilitation therapies Monday-Friday, with modified therapy programming on the weekends.  Your rehabilitation program will include the following services:  Physical Therapy (PT), Occupational Therapy (OT), Speech Therapy (ST), 24 hour per day rehabilitation nursing, Therapeutic Recreaction (TR), Psychology, Neuropsychology, Care Coordinator, Rehabilitation Medicine, Nutrition Services, Pharmacy Services, and Other  Weekly team conferences will be held on Tuesdays to discuss your progress.  Your Inpatient Rehabilitation Care Coordinator will talk with you frequently to get your input and to update you on team discussions.  Team conferences with you and your family in attendance may also be held.  Expected length of stay: 7-10 days    Overall anticipated outcome: Supervision  Depending on your progress and recovery, your program may change. Your Inpatient Rehabilitation Care Coordinator will coordinate services and will keep you informed of any changes. Your Inpatient Rehabilitation Care Coordinator's name and contact numbers are listed  below.  The following services may also be recommended but are not provided by the Inpatient Rehabilitation Center:  Driving Evaluations Home Health Rehabiltiation Services Outpatient Rehabilitation Services Vocational Rehabilitation   Arrangements will be made to provide these services after discharge if needed.  Arrangements include referral to agencies that provide these services.  Your insurance has been verified to be:  BCBS  Your primary doctor is:   [name]  Pertinent information will be shared with your doctor and your insurance company.  Inpatient Rehabilitation Care Coordinator:  Susie Cassette [phone] or [phone]  Information discussed with and copy given to patient by: Gretchen Short, 11/22/2022, 2:41 PM

## 2022-11-22 NOTE — Progress Notes (Signed)
Inpatient Rehabilitation  Patient information reviewed and entered into eRehab system by Masae Lukacs M. Tarnesha Ulloa, M.A., CCC/SLP, PPS Coordinator.  Information including medical coding, functional ability and quality indicators will be reviewed and updated through discharge.    

## 2022-11-22 NOTE — Progress Notes (Signed)
Patient ID: Richard Andrews, male   DOB: 03/12/1961, 62 y.o.   MRN: [id number]  [id number] left message for pt ex-wife Trula Ore to inform on discharge date of 6/18 and requested follow-up to discuss discharge plan.   Cecile Sheerer, MSW, LCSWA Office: [phone] Cell: [phone] Fax: [phone]

## 2022-11-23 LAB — HEMOGLOBIN AND HEMATOCRIT, BLOOD
HCT: 29.3 % — ABNORMAL LOW (ref 39.0–52.0)
Hemoglobin: 10 g/dL — ABNORMAL LOW (ref 13.0–17.0)

## 2022-11-23 LAB — BASIC METABOLIC PANEL
Anion gap: 12 (ref 5–15)
BUN: 13 mg/dL (ref 8–23)
CO2: 22 mmol/L (ref 22–32)
Calcium: 9.3 mg/dL (ref 8.9–10.3)
Chloride: 97 mmol/L — ABNORMAL LOW (ref 98–111)
Creatinine, Ser: 0.83 mg/dL (ref 0.61–1.24)
GFR, Estimated: >60 mL/min (ref >60)
Glucose, Bld: 98 mg/dL (ref 70–99)
Potassium: 4 mmol/L (ref 3.5–5.1)
Sodium: 131 mmol/L — ABNORMAL LOW (ref 135–145)

## 2022-11-23 MED ORDER — SODIUM CHLORIDE 0.9 % IV SOLN
INTRAVENOUS | Status: AC
  Administered 2022-11-23: 980 mL via INTRAVENOUS

## 2022-11-23 NOTE — Progress Notes (Signed)
Occupational Therapy Session Note  Patient Details  Name: Richard Andrews MRN: [id number] Date of Birth: 29-Feb-1961  Today's Date: 11/23/2022 OT Individual Time: 1405-1500 OT Individual Time Calculation (min): 55 min    Short Term Goals: Week 1:  OT Short Term Goal 1 (Week 1): STG= LTG d/t ELOS  Skilled Therapeutic Interventions/Progress Updates:    Pt received lying supine in bed.  Receptive to OT session.  C/o pain 7/10 at shoulder and LLE and stated he had worked hard with therapy today.  Nurse made aware and pain meds provided.  Pt declined changing clothes but did agree to completing self-care washing at the sink. Stand<>pivot transfer with CGA using RW. UB/LB bathing with Min A overall from sit<>stand. Pt able to verbalize WB precautions on LLE and required min verbal cues for NWB. Trial of 1 revolution of BUE ergometer but pt reported pain therefore activity was terminated.  Pt completed R UE strengthening using 3lb dumbbell(bicep curl, chest press, and push press) 3 x10  to address UE strengthening for increased independence with ADLS at home. Completed 3 sets of 10 bicep curls only on the L UE.  Pt back in room, call light within reach, chair alarm in place and all needs met.     Therapy Documentation Precautions:  Precautions Precautions: Knee Type of Shoulder Precautions: Per ortho- AROM allowed, "not too aggressive" Shoulder Interventions: For comfort Precaution Comments: OK for AROM at elbow, wrist, and digits only Required Braces or Orthoses: Knee Immobilizer - Left Knee Immobilizer - Left: On at all times Restrictions Weight Bearing Restrictions: Yes RUE Weight Bearing: Weight bearing as tolerated LLE Weight Bearing: Non weight bearing Other Position/Activity Restrictions: Currently wearing L knee immobilizer at all times   Therapy/Group: Individual Therapy  Liam Graham 11/23/2022, 3:28 PM
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
## 2022-11-23 NOTE — Progress Notes (Signed)
Physical Therapy Session Note  Patient Details  Name: Richard Andrews MRN: [id number] Date of Birth: February 29, 1961  Today's Date: 11/23/2022 PT Individual Time: 1015-1110 PT Individual Time Calculation (min): 55 min   Short Term Goals: Week 1:  PT Short Term Goal 1 (Week 1): STGs=LTGs secondary to ELOS  Skilled Therapeutic Interventions/Progress Updates:    Chart reviewed and pt agreeable to therapy. Pt received seated in WC with 6/10 c/o pain in RUE. Session focused on [facility] management and amb quality and safety to promote safe home access. Pt initiated session with WC propulsion of 132ft using S + increased time. Pt then completed WC management education including brakes and leg rest management with pt able to demonstrate correct use. Pt then completed blocked practice of amb for 77ft with 2 turns for multiple rounds. Of note, pt demonstrated safe RW use for forward amb, but experienced increased fatigue with turn followed by unsafe RW use that required VC to mitigate. Pt then completed SPT to bed with CGA. Session education emphasized WC management and safe DME use. At end of session, pt was left semi-reclined in bed with alarm engaged, nurse call bell and all needs in reach.     Therapy Documentation Precautions:  Precautions Precautions: Knee Type of Shoulder Precautions: Per ortho- AROM allowed, "not too aggressive" Shoulder Interventions: For comfort Precaution Comments: OK for AROM at elbow, wrist, and digits only Required Braces or Orthoses: Knee Immobilizer - Left Knee Immobilizer - Left: On at all times Restrictions Weight Bearing Restrictions: Yes RUE Weight Bearing: Weight bearing as tolerated LLE Weight Bearing: Non weight bearing Other Position/Activity Restrictions: Currently wearing L knee immobilizer at all times General:      Therapy/Group: Individual Therapy  Dionne Milo, PT, DPT 11/23/2022, 11:14 AM

## 2022-11-23 NOTE — Progress Notes (Signed)
Physical Therapy Session Note  Patient Details  Name: Richard Andrews MRN: [id number] Date of Birth: 09-30-1960  Today's Date: 11/23/2022 PT Individual Time: [phone] and [phone] PT Individual Time Calculation (min): 57 min and 26 min  Short Term Goals: Week 1:  PT Short Term Goal 1 (Week 1): STGs=LTGs secondary to ELOS  Skilled Therapeutic Interventions/Progress Updates:     1st Session: Pt received semi reclined in bed and agrees to therapy. Reports pain in R shoulder as well as L knee and LUE. PT provides rest breaks and gentle mobility to manage pain. Pt performs supine to sit with increased time and cues for positoining. Pt performs sit to stand and stand step transfer to [facility] with minA and cues for hand placement, RW management, and positioning. WC transport to gym. Pt cued to perform WC propulsion for mobility training as well as endurance training. Pt propels WC x225' with BUEs with cues for more efficient propulsion technique. Seated rest breaks. Pt performs squat pivot from WC to mat with cues for WC parts management, as well as minA to promote safety and optimal mechanics of transfer. Pt transitions to supine with cues for positioning. Pt completes supine therex for strengthening of BLEs and to prevent atrophy in LLE. Pt performs 2x10 single leg bridges with RLE. Pt then completes x10 quad sets with 5 count hold on each rep. PT provides verbal and tactile cues for optimal performance. Pt verbalizes need to urinate. Supine to sit with minA for comfort and efficiency. Stand pivot with RW back to WC with minA. Pt self propels WC x100' back to room. PT provides minA for stand pivot to toilet with cues for sequencing and use of grab bars. Pt continent of urine and requires minA for stand pivot back to WC. Left seated in WC with all needs within reach.   2nd Session: Pt received seated in [facility] and agrees to therapy, though reports significant pain in R shoulder and verbalizes that he is not sure how  much he will be capable of this session. PT provides gentle mobility and rest breaks to manage pain. Pt self propels WC for endurance training, mobility training, and performing gentle mobility of upper extremities. Pt self propels 2x125' with seated rest break. PT provides cues for propulsion technique and body mechanics. MinA for sqaut pivot back to bed. MinA for LLE management of sit to supine. Left with alarm intact and all needs within reach.   Therapy Documentation Precautions:  Precautions Precautions: Knee Type of Shoulder Precautions: Per ortho- AROM allowed, "not too aggressive" Shoulder Interventions: For comfort Precaution Comments: OK for AROM at elbow, wrist, and digits only Required Braces or Orthoses: Knee Immobilizer - Left Knee Immobilizer - Left: On at all times Restrictions Weight Bearing Restrictions: Yes RUE Weight Bearing: Weight bearing as tolerated LLE Weight Bearing: Non weight bearing Other Position/Activity Restrictions: Currently wearing L knee immobilizer at all times   Therapy/Group: Individual Therapy  Beau Fanny, PT, DPT 11/23/2022, 5:01 PM

## 2022-11-24 LAB — BASIC METABOLIC PANEL
Anion gap: 10 (ref 5–15)
BUN: 13 mg/dL (ref 8–23)
CO2: 23 mmol/L (ref 22–32)
Calcium: 9 mg/dL (ref 8.9–10.3)
Chloride: 100 mmol/L (ref 98–111)
Creatinine, Ser: 0.72 mg/dL (ref 0.61–1.24)
GFR, Estimated: >60 mL/min (ref >60)
Glucose, Bld: 102 mg/dL — ABNORMAL HIGH (ref 70–99)
Potassium: 3.9 mmol/L (ref 3.5–5.1)
Sodium: 133 mmol/L — ABNORMAL LOW (ref 135–145)

## 2022-11-24 MED ORDER — CALCIUM CARBONATE ANTACID 500 MG PO CHEW
1.0000 | CHEWABLE_TABLET | Freq: Three times a day (TID) | ORAL | Status: DC | PRN
  Administered 2022-11-24 – 2022-11-26 (×2): 200 mg via ORAL
  Filled 2022-11-24 (×2): qty 1

## 2022-11-24 NOTE — Progress Notes (Addendum)
Speech Language Pathology Daily Session Note  Patient Details  Name: Richard Andrews MRN: [id number] Date of Birth: 1961/01/13  Today's Date: 11/24/2022 SLP Individual Time: 1001-1101 SLP Individual Time Calculation (min): 60 min  Short Term Goals: Week 1: SLP Short Term Goal 1 (Week 1): STGs=LTGs due to ELOS  Skilled Therapeutic Interventions:   SLP facilitated a variety of tasks targeting cognition. Pt Aox4 during initial orientation questions. Pt benefited from minA overall during diagnostic tx task targeting STM. After initial ~8 min delay, he recalled 2/4 words INDly, increasing to 4/4 w/ min-modA cues (category). After additional 10 min delay w/ the same wordlist, pt improved to recall 3/4 words INDly and 4/4 w/ spv cues. Working memory also targeted via Hotel manager. Pt benefited from spv cues overall throughout tasks. Pt then completed functional money management task [facility] via check register balance. Prior to the money management task, pt benefited from spv cues for functional problem solving and working memory to navigate to [employer] on his phone. Pt also benefited from spv cues for reasoning/abstract thinking during conversational tasks re home safety and return to PLOF. Pt appeared to demonstrate reduced insight into his cognitive deficits during conversation re current barriers negatively impacting return to prev roles and responsibilities, but, responded well to minA cues provided by SLP. Pt assisted back to bed and alarms were set. Bedside table and call light within reach. Recommend cont ST per POC.   Pain Pain Assessment Pain Score: 4  Pain Type: Surgical pain Pain Location: Knee Pain Orientation: Left Pain Descriptors / Indicators: Aching Pain Onset: On-going Pain Intervention(s): Refused;Distraction;Repositioned  Therapy/Group: Individual Therapy  [name], M.S. CCC-SLP 11/24/2022, 1:31 PM

## 2022-11-24 NOTE — Progress Notes (Deleted)
 Subjective:    Patient ID: Richard Andrews, male    DOB: 04/05/1961, 62 y.o.   MRN: [id number]  HPI   Pain Inventory Average Pain {NUMBERS; 0-10:5044} Pain Right Now {NUMBERS; 0-10:5044} My pain is {PAIN DESCRIPTION:21022940}  LOCATION OF PAIN  ***  BOWEL Number of stools per week: *** Oral laxative use {YES/NO:21197} Type of laxative *** Enema or suppository use {YES/NO:21197} History of colostomy {YES/NO:21197} Incontinent {YES/NO:21197}  BLADDER {bladder options:24190} In and out cath, frequency *** Able to self cath {YES/NO:21197} Bladder incontinence {YES/NO:21197} Frequent urination {YES/NO:21197} Leakage with coughing {YES/NO:21197} Difficulty starting stream {YES/NO:21197} Incomplete bladder emptying {YES/NO:21197}   Mobility {MOBILITY ADL:21022944}  Function {FUNCTION:21022946}  Neuro/Psych {NEURO/PSYCH:21022948}  Prior Studies {CPRM PRIOR STUDIES:21022953}  Physicians involved in your care {CPRM PHYSICIANS INVOLVED IN YOUR CARE:21022954}   Family History  Problem Relation Age of Onset   Heart disease Mother    Hyperlipidemia Mother    Skin cancer Mother    Cancer Father    Multiple myeloma Sister    Throat cancer Sister    Social History   Socioeconomic History   Marital status: Married    Spouse name: Christina   Number of children: 5   Years of education: 12   Highest education level: Not on file  Occupational History   Occupation: project manager  Tobacco Use   Smoking status: Every Day    Packs/day: 1.00    Years: 10.00    Additional pack years: 0.00    Total pack years: 10.00    Types: Cigarettes   Smokeless tobacco: Current    Types: Chew  Vaping Use   Vaping Use: Never used  Substance and Sexual Activity   Alcohol use: Yes    Alcohol/week: 16.0 - 20.0 standard drinks of alcohol    Types: 16 - 20 Standard drinks or equivalent per week    Comment: 1 gallon/week   Drug use: Never   Sexual activity: Yes    Partners:  Female  Other Topics Concern   Not on file  Social History Narrative   Not on file   Social Determinants of Health   Financial Resource Strain: Not on file  Food Insecurity: Not on file  Transportation Needs: Not on file  Physical Activity: Not on file  Stress: Not on file  Social Connections: Not on file   Past Surgical History:  Procedure Laterality Date   BIOPSY  01/22/2021   Procedure: BIOPSY;  Surgeon: Cirigliano, Vito V, DO;  Location: [facility];  Service: Gastroenterology;;   ESOPHAGOGASTRODUODENOSCOPY (EGD) WITH PROPOFOL N/A 01/22/2021   Procedure: ESOPHAGOGASTRODUODENOSCOPY (EGD) WITH PROPOFOL;  Surgeon: Cirigliano, Vito V, DO;  Location: [facility];  Service: Gastroenterology;  Laterality: N/A;   I & D EXTREMITY Right 01/10/2020   Procedure: ORIF RIGHT WRIST;  Surgeon: Gramig, William, MD;  Location: [facility];  Service: Orthopedics;  Laterality: Right;   IR ANGIO EXTERNAL CAROTID SEL EXT CAROTID UNI R MOD SED  10/04/2022   IR ANGIO INTRA EXTRACRAN SEL INTERNAL CAROTID UNI R MOD SED  09/30/2022   IR ANGIOGRAM FOLLOW UP STUDY  09/30/2022   IR NEURO EACH ADD'L AFTER BASIC UNI RIGHT (MS)  10/04/2022   IR TRANSCATH/EMBOLIZ  09/30/2022   OPEN REDUCTION INTERNAL FIXATION (ORIF) DISTAL RADIAL FRACTURE Left 01/28/2015   Procedure: OPEN REDUCTION INTERNAL FIXATION (ORIF) LEFT DISTAL RADIAL FRACTURE;  Surgeon: Naiping M Xu, MD;  Location: [facility];  Service: Orthopedics;  Laterality: Left;   ORIF HUMERUS FRACTURE Right 09/27/2022     Procedure: OPEN REDUCTION INTERNAL FIXATION (ORIF) PROXIMAL HUMERUS FRACTURE;  Surgeon: Landau, Joshua, MD;  Location: [facility];  Service: Orthopedics;  Laterality: Right;   RADIOLOGY WITH ANESTHESIA N/A 09/30/2022   Procedure: IR WITH ANESTHESIA MMA EMBOLIZATION;  Surgeon: Radiologist, Medication, MD;  Location: [facility];  Service: Radiology;  Laterality: N/A;   TONSILLECTOMY     Past Medical History:  Diagnosis Date   Allergy    Distal radius  fracture, left    Hypertension    There were no vitals taken for this visit.  Opioid Risk Score:   Fall Risk Score:  `1  Depression screen PHQ 2/9     02/01/2021    9:35 AM 02/06/2020    3:30 PM 12/20/2016    2:02 PM  Depression screen PHQ 2/9  Decreased Interest 0 0 2  Down, Depressed, Hopeless 0 0 0  PHQ - 2 Score 0 0 2  Altered sleeping  0 3  Tired, decreased energy  2 3  Change in appetite  3 3  Feeling bad or failure about yourself   0 3  Trouble concentrating  0 0  Moving slowly or fidgety/restless  0 2  Suicidal thoughts  0 0  PHQ-9 Score  5 16  Difficult doing work/chores  Somewhat difficult     Review of Systems     Objective:   Physical Exam        Assessment & Plan:    
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
## 2022-11-24 NOTE — Progress Notes (Signed)
Occupational Therapy Session Note  Patient Details  Name: Richard Andrews MRN: [id number] Date of Birth: 05/08/1961  Today's Date: 11/24/2022 OT Individual Time: 7846-9629 OT Individual Time Calculation (min): 44 min   Short Term Goals: Week 1:  OT Short Term Goal 1 (Week 1): STG= LTG d/t ELOS  Skilled Therapeutic Interventions/Progress Updates:     Pt received siting up in bed presenting to be in good spirits receptive to skilled OT session reporting 0/10 pain- OT offering intermittent rest breaks, repositioning, and therapeutic support to optimize participation in therapy session. Pt wearing L knee immobilizer upon OT arrival. Focus this session functional mobility training, activity tolerance and dynamic standing balance. Pt transitioned to EOB with supervision using bed features. R shoes donned total A for time management and energy conservation. Stand pivot using RW CGA with min verbal/tactile cues provided to maintain NWB'ing precautions and for balance. Pt propelled wc to therapy gym >200 ft for functional mobility and endurance training and to support carryover of learning from previous PT session. Pt able to complete with occasional rest breaks at supervision level with min verbal cues provided for technique. Engaged pt in dynamic standing balance activity with posterior reaching incorporated into task to simulate completing posterior peri-care and clothing management during toileting and dressing BADLs. Pt able to complete sit<>stands x3 using RW with CGA with assistance required for balance and verbal cues provided to maintain precautions. Pt able to maintain dynamic standing balance while retrieving clothes pins from back of his shirt and donning onto vertical pole with CGA no LOB noted, maximal effort required. Pt reporting fatigue following. Educated Pt on energy conservation techniques and importance of pacing activities and taking rest breaks with Pt receptive. Pt propelled his wc back to  his room supervision with increased time provided and occasional rest breaks. Pt was left resting in wc with call bell in reach, seat belt alarm on, and all needs met.    Therapy Documentation Precautions:  Precautions Precautions: Knee Type of Shoulder Precautions: Per ortho- AROM allowed, "not too aggressive" Shoulder Interventions: For comfort Precaution Comments: OK for AROM at elbow, wrist, and digits only Required Braces or Orthoses: Knee Immobilizer - Left Knee Immobilizer - Left: On at all times Restrictions Weight Bearing Restrictions: Yes RUE Weight Bearing: Weight bearing as tolerated LLE Weight Bearing: Non weight bearing Other Position/Activity Restrictions: Currently wearing L knee immobilizer at all times   Therapy/Group: Individual Therapy  Army Fossa 11/24/2022, 1:36 PM
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
## 2022-11-24 NOTE — Progress Notes (Signed)
Physical Therapy Session Note  Patient Details  Name: Richard Andrews MRN: [id number] Date of Birth: 1961/03/05  Today's Date: 11/24/2022 PT Individual Time: 1st Treatment Session: [phone]; 2nd Treatment Session: 1415-1530 PT Individual Time Calculation (min): 30 min; 75 min   Short Term Goals: Week 1:  PT Short Term Goal 1 (Week 1): STGs=LTGs secondary to ELOS  Skilled Therapeutic Interventions/Progress Updates:  1st Treatment Session- Patient greeted supine in bed and agreeable to PT treatment session and transitioned to EOB ModI. Patient denied changing his clothes this morning, despite encouragement. Patient stood from EOB with RW and CGA and then performed stand pivot transfer to wheelchair with CGA. Patient propelled manual wheelchair 150' with B UE and distant supv. Patient ascended/descended a low grade ramp x2 trials with B UE and CGA/close SBA from therapist. VC and demonstration for improved propulsion technique with good improvements noted during second trial. Patient then performed x7 sit/stands with RW and CGA/SBA for safety- Focus was to figure out which hand placement makes the most sense for patient and promotes the most independence. With increased repetition, patient reported pushing with B UE from arm rest of wheelchair was the best. Patient returned to his room and left sitting upright in wheelchair in room with posey belt on, call bell within reach and all needs met.    2nd Treatment Session- Patient greeted sitting upright in wheelchair in room and agreeable to PT treatment session. Patient wheeled dependently to dayroom for time management and energy conservation.   Patient performed various sit/stands with the use of a RW and SBA- VC for pushing up from the wheelchair with B UE.   Patient gait trained x20' with RW and CGA- VC for staying within the frame of the walker, improved postural extension/forward gaze and increased Rt hop length with minimal improvements noted.    Patient then participated in a dance activity in the rehab gym with other patients in order to improve mood/affect, community reintegration, coordination, global strengthening and endurance/activity tolerance. All Rt LE exercises were completed with 4# weight donned.   Patient returned to his room and stood from wheelchair with RW and SBA- Patient then performed stand pivot transfer with CGA and required VC for sequencing as patient attempted to ditch his walker during the transfer. Therapist spent time providing education to his daughter regarding discharge plan, DME, CLOF and family training tomorrow.    Therapy Documentation Precautions:  Precautions Precautions: Knee Type of Shoulder Precautions: Per ortho- AROM allowed, "not too aggressive" Shoulder Interventions: For comfort Precaution Comments: OK for AROM at elbow, wrist, and digits only Required Braces or Orthoses: Knee Immobilizer - Left Knee Immobilizer - Left: On at all times Restrictions Weight Bearing Restrictions: Yes RUE Weight Bearing: Weight bearing as tolerated LLE Weight Bearing: Non weight bearing Other Position/Activity Restrictions: Currently wearing L knee immobilizer at all times  Pain: 7/10 Lt knee pain, however patient was not due for pain medication at this time- Therapist provided repositioning in the wheelchair and elevating in the bed at end of treatment session which helped.    Therapy/Group: Individual Therapy  Briana Farner 11/24/2022, 7:54 AM

## 2022-11-25 DIAGNOSIS — F1020 Alcohol dependence, uncomplicated: Secondary | ICD-10-CM

## 2022-11-25 DIAGNOSIS — S82145S Nondisplaced bicondylar fracture of left tibia, sequela: Secondary | ICD-10-CM

## 2022-11-25 LAB — BASIC METABOLIC PANEL
Anion gap: 9 (ref 5–15)
BUN: 10 mg/dL (ref 8–23)
CO2: 23 mmol/L (ref 22–32)
Calcium: 9.1 mg/dL (ref 8.9–10.3)
Chloride: 100 mmol/L (ref 98–111)
Creatinine, Ser: 0.75 mg/dL (ref 0.61–1.24)
GFR, Estimated: >60 mL/min (ref >60)
Glucose, Bld: 117 mg/dL — ABNORMAL HIGH (ref 70–99)
Potassium: 3.5 mmol/L (ref 3.5–5.1)
Sodium: 132 mmol/L — ABNORMAL LOW (ref 135–145)

## 2022-11-25 MED ORDER — ACETAMINOPHEN 325 MG PO TABS: 325.0000 mg | ORAL_TABLET | ORAL | Status: DC | PRN
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
## 2022-11-25 NOTE — Progress Notes (Signed)
Patient ID: KAHSEEM ANASTASIA, male   DOB: 17-May-1961, 62 y.o.   MRN: [id number]  SW met with pt and pt dtr during end of PT session. SW will order w/c. Pt opted out of hospital bed, and his daughter will bring his bed downstairs. Pt will not have therapies due to WB restrictions, and informed on therapies until status changes.   SW ordered w/c with [employer] [facility] via parachute.  Cecile Sheerer, MSW, LCSWA Office: [phone] Cell: [phone] Fax: [phone]
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
## 2022-11-25 NOTE — Progress Notes (Signed)
Occupational Therapy Session Note  Patient Details  Name: AYHEM SPEYER MRN: [id number] Date of Birth: May 30, 1961  Today's Date: 11/25/2022 OT Individual Time: 1004-1046 OT Individual Time Calculation (min): 42 min    Short Term Goals: Week 1:  OT Short Term Goal 1 (Week 1): STG= LTG d/t ELOS  Skilled Therapeutic Interventions/Progress Updates:    Pt received lying supine in bed with daughter present for family education.  Pt agreed to OT session and completed supine<> EOB.  Education provided on pts CLOF and WB precautions.  Both pt and daughter did not have any concerns at this time and expressed no concerns with d/c home.  Education provided on RW tray for use with functional mobility for ADLs.  Pt able to stand<>pivot to wheelchair with (S) using RW and min verbal cues for NWB on LLE. Self-care completed at sink sitting in wheelchair  UB/LB dressing with min A for threading LB clothing on LLE and discussed education for use of reacher for LB dressing in future session to increase independence with LB dressing. Provided general recommendations for fall risk reduction and medication management at home. Pt left in wheelchair, call light in place and daughter present in room.  Therapy Documentation Precautions:  Precautions Precautions: Knee Type of Shoulder Precautions: Per ortho- AROM allowed, "not too aggressive" Shoulder Interventions: For comfort Precaution Comments: OK for AROM at elbow, wrist, and digits only Required Braces or Orthoses: Knee Immobilizer - Left Knee Immobilizer - Left: On at all times Restrictions Weight Bearing Restrictions: Yes RUE Weight Bearing: Weight bearing as tolerated LLE Weight Bearing: Non weight bearing Other Position/Activity Restrictions: Currently wearing L knee immobilizer at all times    Therapy/Group: Individual Therapy  Liam Graham 11/25/2022, 10:54 AM
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
## 2022-11-25 NOTE — Progress Notes (Signed)
Speech Language Pathology Daily Session Note  Patient Details  Name: Richard Andrews MRN: [id number] Date of Birth: 12/05/1960  Today's Date: 11/25/2022 SLP Individual Time: 1133-1203 SLP Individual Time Calculation (min): 30 min  Short Term Goals: Week 1: SLP Short Term Goal 1 (Week 1): STGs=LTGs due to ELOS  Skilled Therapeutic Interventions:   SLP facilitated a variety of tasks targeting cognition, including family education/training. SLP provided extensive education to pt and daughter re remaining memory deficits, external memory aids available for the home environment, and recommended assistance w/ money/med management. Also facilitated discussion re need for complete cognitive evaluation prior to returning to work and driving. They verbalized and demonstrated understanding of education. Anticipate pt's daughter will have to assist w/ carryover of information upon d/c 2* STM deficits. Pt also completed short structured memory task w/ 4 pictures and SLP utilized errorless learning to review association strategy. Pt left in his wheelchair w/ chair alarm activated. Belongings and call light within reach. Recommend cont ST per POC.   Pain Pain Assessment Pain Scale: 0-10 Pain Score: 0-No pain Pain Location: Shoulder Pain Orientation: Right;Left Pain Intervention(s): Medication (See eMAR)  Therapy/Group: Individual Therapy  [name], M.S. CCC-SLP 11/25/2022, 12:56 PM

## 2022-11-26 ENCOUNTER — Inpatient Hospital Stay (HOSPITAL_COMMUNITY): Payer: BC Managed Care – PPO

## 2022-11-26 DIAGNOSIS — M79609 Pain in unspecified limb: Secondary | ICD-10-CM

## 2022-11-26 DIAGNOSIS — K5900 Constipation, unspecified: Secondary | ICD-10-CM

## 2022-11-26 DIAGNOSIS — S82145D Nondisplaced bicondylar fracture of left tibia, subsequent encounter for closed fracture with routine healing: Secondary | ICD-10-CM

## 2022-11-26 MED ORDER — SORBITOL 70 % SOLN
30.0000 mL | Freq: Every day | Status: DC | PRN
  Administered 2022-11-26: 30 mL via ORAL
  Filled 2022-11-26: qty 30

## 2022-11-26 NOTE — Progress Notes (Signed)
Lower extremity venous bilateral study completed.   Please see CV Proc for preliminary results.   Deloros Beretta, RDMS, RVT  

## 2022-11-26 NOTE — Progress Notes (Signed)
Speech Language Pathology Daily Session Note  Patient Details  Name: REGIONAL HOVAN MRN: [id number] Date of Birth: 1961/01/16  Today's Date: 11/26/2022 SLP Individual Time: 1000-1030 SLP Individual Time Calculation (min): 30 min  Short Term Goals: Week 1: SLP Short Term Goal 1 (Week 1): STGs=LTGs due to ELOS  Skilled Therapeutic Interventions: Pt seen in bed for tx. Skilled SLP intervention focused on cognition. Pt Reported ni difficulty with his memory. Education provided on using visual aids to increase recall of day to day information once home and managing therapy and doctors appointment scheduling. He recalled verbal information after a delay with modA repetition of information. Pt was able to recall purpose for 3 medications he was taking prior to hospitalization. Cont with therapy per plan of care.   Pain Pain Assessment Pain Scale: Faces Pain Score: 7  Faces Pain Scale: No hurt Pain Location: Shoulder Pain Orientation: Right Pain Intervention(s): Medication (See eMAR)  Therapy/Group: Individual Therapy  Amil Amen A Dinesha Twiggs 11/26/2022, 10:30 AM

## 2022-11-26 NOTE — Progress Notes (Signed)
Occupational Therapy Session Note  Patient Details  Name: Richard Andrews MRN: [id number] Date of Birth: 1960/07/27  Today's Date: 11/26/2022 OT Individual Time: 4098-1191 OT Individual Time Calculation (min): 50 min    Short Term Goals: Week 1:  OT Short Term Goal 1 (Week 1): STG= LTG d/t ELOS  Skilled Therapeutic Interventions/Progress Updates:    Patient indicated that he rested okay and he had no report of pain at the time of treatment.  The pt was in agreement with completing skilled OT to improve his functional outcome. The pt was able to transfer from bed LOF to w/c with CGA using the RW for additional balance while adhering to non wt bearing status with the LLE. The pt went on to complete bicep curls, horizontal abduction, and shld extension, 2 sets of 10 with rest breaks as needed. The pt required 1 rest break. The pt went on to indicated that he needed to go to the rest room , the pt was transported to the restroom and was able to transfer to the commode using the grab bar and RW for additional balance with MinA. The pt was able to come from sit to stand using the grab bar for hygiene at [facility], he was able to donn her pants with ModA  using the grab bar.  The pt was able to transfer from standing to w/c LOF with MinA using the grab bar and additional time.  The pt was transported to his main quarters and was transported back to bed with MinA for managing BLE using the w/c handle and bed rail for additional balance.  The call light and bedside table were within reach and all additional needs were address prior to exiting the room.    Therapy Documentation Precautions:  Precautions Precautions: Knee Type of Shoulder Precautions: Per ortho- AROM allowed, "not too aggressive" Shoulder Interventions: For comfort Precaution Comments: OK for AROM at elbow, wrist, and digits only Required Braces or Orthoses: Knee Immobilizer - Left Knee Immobilizer - Left: On at all times Restrictions Weight  Bearing Restrictions: Yes RUE Weight Bearing: Weight bearing as tolerated LLE Weight Bearing: Non weight bearing Other Position/Activity Restrictions: Currently wearing L knee immobilizer at all times  Therapy/Group: Individual Therapy  Lavona Mound 11/26/2022, 3:48 PM
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
## 2022-11-26 NOTE — Progress Notes (Signed)
Occupational Therapy Session Note  Patient Details  Name: Richard Andrews MRN: [id number] Date of Birth: 07/31/1960  Today's Date: 11/26/2022 OT Individual Time: 0700-0810 OT Individual Time Calculation (min): 70 min    Short Term Goals: Week 1:  OT Short Term Goal 1 (Week 1): STG= LTG d/t ELOS  Skilled Therapeutic Interventions/Progress Updates:    OT session focused on functional endurance, w/c mobility, BUE strength, and standing tolerance. Pt received supine in bed agreeable to therapy and declining B&D. Pt completed supine>sit with supervision then required min A stand pivot transfer bed>w/c with RW and min cues for adherence to WB precautions. Pt assisted with propelling self in w/c to outdoor setting requiring mod rest breaks d/t increased shoulder pain. OT assisted with guidance using w/c to minimize pain. Pt with brighter affect when outside with OT also engaging in therapeutic discussion about discharge plans. Pt completed sit<>stand 2x with RW and supervision while maintaining static standing balance ~2 min and ~4 min. Pt returned to unit and reporting "throbbing" pain in LLE, however declined pain medicine. Pt requesting to remain in w/c with LLE elevated and OT providing ice for R shoulder. Pt left with safety belt donned and all needs in reach.   Therapy Documentation Precautions:  Precautions Precautions: Knee Type of Shoulder Precautions: Per ortho- AROM allowed, "not too aggressive" Shoulder Interventions: For comfort Precaution Comments: OK for AROM at elbow, wrist, and digits only Required Braces or Orthoses: Knee Immobilizer - Left Knee Immobilizer - Left: On at all times Restrictions Weight Bearing Restrictions: Yes RUE Weight Bearing: Weight bearing as tolerated LLE Weight Bearing: Non weight bearing Other Position/Activity Restrictions: Currently wearing L knee immobilizer at all times General:   Vital Signs:   Pain: Pain Assessment Pain Score:  Asleep ADL: ADL Eating: Set up Where Assessed-Eating: Bed level Grooming: Supervision/safety Where Assessed-Grooming: Sitting at sink, Wheelchair Upper Body Bathing: Supervision/safety Where Assessed-Upper Body Bathing: Sitting at sink, Wheelchair Lower Body Bathing: Contact guard Where Assessed-Lower Body Bathing: Standing at sink Upper Body Dressing: Supervision/safety Where Assessed-Upper Body Dressing: Sitting at sink, Wheelchair Lower Body Dressing: Minimal assistance Where Assessed-Lower Body Dressing: Sitting at sink Toileting: Supervision/safety (Using urinal) Where Assessed-Toileting: Bed level Toilet Transfer: Minimal assistance Toilet Transfer Method: Stand pivot Toilet Transfer Equipment: Raised toilet seat, Grab bars Tub/Shower Transfer: Moderate assistance Tub/Shower Transfer Method: Stand pivot Tub/Shower Equipment: Emergency planning/management officer, Acupuncturist: Moderate assistance Film/video editor Method: Warden/ranger: Emergency planning/management officer, Engineer, production   Exercises:   Other Treatments:     Therapy/Group: Individual Therapy  Daneil Dan 11/26/2022, 8:04 AM

## 2022-11-27 DIAGNOSIS — M79605 Pain in left leg: Secondary | ICD-10-CM
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
## 2022-11-27 NOTE — Progress Notes (Signed)
PRN tums given at 2013. PRN tylenol given at 2008, mostly complaining of right shoulder and LLE pain. PRN oxy ir 10mg 's given at 2223 & 0342. LUE with large amount of bruising, foam dressing in place, abrasion with scab observed. Alfredo Martinez A

## 2022-11-28 ENCOUNTER — Encounter: Payer: BC Managed Care – PPO | Admitting: Physical Medicine and Rehabilitation

## 2022-11-28 ENCOUNTER — Other Ambulatory Visit (HOSPITAL_COMMUNITY): Payer: Self-pay

## 2022-11-28 LAB — COMPREHENSIVE METABOLIC PANEL
ALT: 31 U/L (ref 0–44)
AST: 39 U/L (ref 15–41)
Albumin: 3.2 g/dL — ABNORMAL LOW (ref 3.5–5.0)
Alkaline Phosphatase: 163 U/L — ABNORMAL HIGH (ref 38–126)
Anion gap: 11 (ref 5–15)
BUN: 11 mg/dL (ref 8–23)
CO2: 23 mmol/L (ref 22–32)
Calcium: 9.3 mg/dL (ref 8.9–10.3)
Chloride: 98 mmol/L (ref 98–111)
Creatinine, Ser: 0.82 mg/dL (ref 0.61–1.24)
GFR, Estimated: >60 mL/min (ref >60)
Glucose, Bld: 96 mg/dL (ref 70–99)
Potassium: 3.8 mmol/L (ref 3.5–5.1)
Sodium: 132 mmol/L — ABNORMAL LOW (ref 135–145)
Total Bilirubin: 0.8 mg/dL (ref 0.3–1.2)
Total Protein: 7.6 g/dL (ref 6.5–8.1)

## 2022-11-28 LAB — CBC
HCT: 29.6 % — ABNORMAL LOW (ref 39.0–52.0)
Hemoglobin: 9.8 g/dL — ABNORMAL LOW (ref 13.0–17.0)
MCH: 34 pg (ref 26.0–34.0)
MCHC: 33.1 g/dL (ref 30.0–36.0)
MCV: 102.8 fL — ABNORMAL HIGH (ref 80.0–100.0)
Platelets: 265 10*3/uL (ref 150–400)
RBC: 2.88 MIL/uL — ABNORMAL LOW (ref 4.22–5.81)
RDW: 13.4 % (ref 11.5–15.5)
WBC: 3.6 10*3/uL — ABNORMAL LOW (ref 4.0–10.5)
nRBC: 0 % (ref 0.0–0.2)

## 2022-11-28 MED ORDER — PANTOPRAZOLE SODIUM 40 MG PO TBEC
40.0000 mg | DELAYED_RELEASE_TABLET | Freq: Every day | ORAL | 0 refills | Status: DC
  Filled 2022-11-28: qty 30, 30d supply, fill #0

## 2022-11-28 MED ORDER — OXYCODONE HCL 5 MG PO TABS
5.0000 mg | ORAL_TABLET | Freq: Four times a day (QID) | ORAL | Status: DC | PRN
  Administered 2022-11-28 – 2022-11-29 (×3): 10 mg via ORAL
  Filled 2022-11-28 (×3): qty 2

## 2022-11-28 MED ORDER — DOCUSATE SODIUM 100 MG PO CAPS: 100.0000 mg | ORAL_CAPSULE | Freq: Two times a day (BID) | ORAL | 0 refills | Status: DC

## 2022-11-28 MED ORDER — OXYCODONE HCL 5 MG PO TABS
5.0000 mg | ORAL_TABLET | Freq: Four times a day (QID) | ORAL | 0 refills | Status: DC | PRN
  Filled 2022-11-28: qty 30, 5d supply, fill #0

## 2022-11-28 MED ORDER — TRAZODONE HCL 50 MG PO TABS
50.0000 mg | ORAL_TABLET | Freq: Every evening | ORAL | 0 refills | Status: DC | PRN
  Filled 2022-11-28: qty 15, 15d supply, fill #0

## 2022-11-28 MED ORDER — FOLIC ACID 1 MG PO TABS
1.0000 mg | ORAL_TABLET | Freq: Every day | ORAL | 0 refills | Status: DC
  Filled 2022-11-28: qty 30, 30d supply, fill #0

## 2022-11-28 MED ORDER — GABAPENTIN 300 MG PO CAPS
300.0000 mg | ORAL_CAPSULE | Freq: Every day | ORAL | 0 refills | Status: DC
  Filled 2022-11-28: qty 30, 30d supply, fill #0

## 2022-11-28 MED ORDER — OXYCODONE HCL 5 MG PO TABS
5.0000 mg | ORAL_TABLET | ORAL | 0 refills | Status: DC | PRN
  Filled 2022-11-28: qty 30, 5d supply, fill #0

## 2022-11-28 NOTE — Progress Notes (Signed)
Patient ID: Richard Andrews, male   DOB: Jan 25, 1961, 62 y.o.   MRN: [id number]  SW met with pt and dtr. PT given handicap placard. Still waiting for w/c to be delivered.   Cecile Sheerer, MSW, LCSWA Office: [phone] Cell: [phone] Fax: [phone]

## 2022-11-28 NOTE — Progress Notes (Signed)
Occupational Therapy Discharge Summary  Patient Details  Name: Richard Andrews MRN: [id number] Date of Birth: 07/03/1960  Date of Discharge from OT service:November 28, 2022  Today's Date: 11/28/2022 OT Individual Time: 4098-1191 OT Individual Time Calculation (min): 99 min    Pt greeted semi-reclined in bed and agreeable to OT treatment session focused on increased independence with self-care tasks. OT educated on use of long handled AE for LB BADL tasks. Mod I for bed mobility and supervision for stand-pivots to wc using RW. Pt with reported R shoulder pain with movement and weight bearing.  Supervision for sit<>stands to wash lower body in standing. Pt demonstrated understanding of use of LH AE after demonstration from OT and able to perform LB dressing with supervision now. OT educated on where to purchase reacher and sock-aid and priced items for pt using [employer]. Pt brought to therapy gym and OT applied kinesiotape to LU E for pain and edema management. OT used large ACE wrap to make a sling for comfort for R UE. Discussed home dc plan extensively. Education provided regarding safe BADL performance in home environment, home modifications, DME needs, energy conservation techniques, and safety awareness. Pt returned to room and pivoted back to bed with bed rail and supervision. Ice applied to R shoulder for pain management as well. PT lef semi-reclined in bed with bed alarm on, call bell in reach, and needs met .     Patient has met 6 of 6 long term goals due to improved activity tolerance, improved balance, postural control, and ability to compensate for deficits.  Patient to discharge at overall Supervision level.  Patient's care partner is independent to provide the necessary physical assistance at discharge for higher level iADL tasks.    Reasons goals not met: n/a  Recommendation:  Patient will benefit from ongoing skilled OT services in outpatient setting to continue to advance functional  skills in the area of  functional use of R UE .  Equipment: 16" manual wc  Reasons for discharge: treatment goals met and discharge from hospital  Patient/family agrees with progress made and goals achieved: Yes  OT Discharge Precautions/Restrictions  Precautions Precautions: Knee Required Braces or Orthoses: Knee Immobilizer - Left Knee Immobilizer - Left: On at all times Restrictions Weight Bearing Restrictions: Yes RUE Weight Bearing: Weight bearing as tolerated LLE Weight Bearing: Non weight bearing Other Position/Activity Restrictions: Currently wearing L knee immobilizer at all times Pain Pain Assessment Pain Scale: 0-10 Pain Score: 2  Pain Type: Acute pain Pain Location: Shoulder Pain Orientation: Right Pain Descriptors / Indicators: Aching Pain Frequency: Intermittent Pain Onset: On-going Pain Intervention(s): Medication (See eMAR) ADL ADL Equipment Provided: Reacher, Sock aid Eating: Independent Where Assessed-Eating: Bed level Grooming: Independent Where Assessed-Grooming: Sitting at sink, Wheelchair Upper Body Bathing: Supervision/safety Where Assessed-Upper Body Bathing: Sitting at sink, Wheelchair Lower Body Bathing: Supervision/safety Where Assessed-Lower Body Bathing: Standing at sink Upper Body Dressing: Supervision/safety Where Assessed-Upper Body Dressing: Sitting at sink, Wheelchair Lower Body Dressing: Supervision/safety Where Assessed-Lower Body Dressing: Sitting at sink Toileting: Supervision/safety Where Assessed-Toileting: Bed level Toilet Transfer: Close supervision Toilet Transfer Method: Stand pivot Toilet Transfer Equipment: Raised toilet seat, Grab bars Tub/Shower Transfer: Moderate assistance Tub/Shower Transfer Method: Stand pivot Tub/Shower Equipment: Emergency planning/management officer, Acupuncturist: Moderate assistance Film/video editor Method: Warden/ranger: Emergency planning/management officer, Glass blower/designer  Perception: Within Functional Limits Praxis Praxis: Intact Cognition Cognition Overall Cognitive Status: History of cognitive impairments - at baseline Arousal/Alertness:  Awake/alert Orientation Level: Place;Person;Situation Person: Oriented Place: Oriented Situation: Oriented Memory: Impaired Safety/Judgment: Appears intact Brief Interview for Mental Status (BIMS) Repetition of Three Words (First Attempt): 3 Temporal Orientation: Year: Correct Temporal Orientation: Month: Accurate within 5 days Temporal Orientation: Day: Correct Recall: "Sock": Yes, no cue required Recall: "Blue": Yes, no cue required Recall: "Bed": Yes, no cue required BIMS Summary Score: 15 Sensation Sensation Light Touch: Appears Intact Hot/Cold: Appears Intact Coordination Gross Motor Movements are Fluid and Coordinated: No Fine Motor Movements are Fluid and Coordinated: Yes Coordination and Movement Description: coordination slow but improved since eval Motor  Motor Motor - Discharge Observations: generalized weakness improved Mobility  Bed Mobility Supine to Sit: Independent Sit to Supine: Independent Transfers Sit to Stand: Supervision/Verbal cueing Stand to Sit: Supervision/Verbal cueing   Balance Static Sitting Balance Static Sitting - Balance Support: Bilateral upper extremity supported Static Sitting - Level of Assistance: 7: Independent Dynamic Sitting Balance Dynamic Sitting - Level of Assistance: 7: Independent Static Standing Balance Static Standing - Level of Assistance: 5: Stand by assistance Dynamic Standing Balance Dynamic Standing - Level of Assistance: 5: Stand by assistance Extremity/Trunk Assessment RUE Assessment Active Range of Motion (AROM) Comments: WFL elbow wrist and digit. able to actively flex to 90 degrees General Strength Comments: No MMT completed 2/2 humeral ORIF RUE Body System: Ortho LUE Assessment LUE Assessment: Within Functional  Limits   [name] Dollie Bressi 11/28/2022, 9:26 AM

## 2022-11-28 NOTE — Progress Notes (Signed)
Inpatient Rehabilitation Discharge Medication Review by a Pharmacist  A complete drug regimen review was completed for this patient to identify any potential clinically significant medication issues.  High Risk Drug Classes Is patient taking? Indication by Medication  Antipsychotic No   Anticoagulant No   Antibiotic No   Opioid Yes OxyIR- acute pain  Antiplatelet No   Hypoglycemics/insulin No   Vasoactive Medication No   Chemotherapy No   Other Yes Gabapentin- neuropathic pain Protonix- GERD Trazodone- sleep     Type of Medication Issue Identified Description of Issue Recommendation(s)  Drug Interaction(s) (clinically significant)     Duplicate Therapy     Allergy     No Medication Administration End Date     Incorrect Dose     Additional Drug Therapy Needed     Significant med changes from prior encounter (inform family/care partners about these prior to discharge).    Other       Clinically significant medication issues were identified that warrant physician communication and completion of prescribed/recommended actions by midnight of the next day:  No   Time spent performing this drug regimen review (minutes):  30   Rachella Basden BS, PharmD, BCPS Clinical Pharmacist 11/28/2022 8:11 AM  Contact: [phone] after 3 PM  "Be curious, not judgmental..." -Debbora Dus

## 2022-11-28 NOTE — Progress Notes (Signed)
Speech Language Pathology Discharge Summary  Patient Details  Name: Richard Andrews MRN: [id number] Date of Birth: 04-30-1961  Date of Discharge from SLP service:November 28, 2022  Today's Date: 11/28/2022 SLP Individual Time: 1415-1500 SLP Individual Time Calculation (min): 45 min   Skilled Therapeutic Interventions: SLP facilitated a variety of tx tasks targeting cognition. Pt able to recall compensatory memory strategies reviewed on 6/14 w/ spv cues. Pt utilized repetition and association to recall 4/4 words after 8 and additional 10 min delay w/ spv cues. He benefited from [employer] verbal cues for attention and working memory during Advertising account planner and memory tasks. Pt completed complex verbal problem solving w/ spv verbal cues for reasoning and mental flexibility. He demonstrated adequate safety awareness during transfer back to bed after tx session was complete, however, anticipate that pt will continue to require supervision cues for safety and problem solving during physical and cognitive tasks of increased complexity in the home environment. Pt left in bed w/ bed alarm on. Call light and personal belongings within reach.     Patient has met 2 of 3 long term goals.  Patient to discharge at overall Supervision level.   Reasons goals not met: emerging success w/ utilization of compensatory memory aids, remains minA at this time   Clinical Impression/Discharge Summary:   Pt has demonstrated good progress overall as evidenced by improved problem solving, reasoning, attention, and emerging success w/ short term memory. He is currently completing mildly complex cognitive tasks w/ spv cues. Pt/family education complete and pt will discharge home w/ 24/7 assistance from family. He would benefit from continued ST upon d/c to target cognition in tasks of increased complexity, facilitate return to prev roles/responsibilities, and maximize pt independence.   Care Partner:  Caregiver Able to Provide  Assistance: Yes  Type of Caregiver Assistance: Cognitive;Physical  Recommendation:  Outpatient SLP;24 hour supervision/assistance  Rationale for SLP Follow Up: Maximize cognitive function and independence;Reduce caregiver burden   Equipment:     Reasons for discharge: Discharged from hospital   Patient/Family Agrees with Progress Made and Goals Achieved: Yes    [name], M.S. CCC-SLP 11/28/2022, 3:44 PM

## 2022-11-28 NOTE — Progress Notes (Signed)
Physical Therapy Discharge Summary  Patient Details  Name: Richard Andrews MRN: [id number] Date of Birth: [birth year]  Date of Discharge from PT service:November 28, 2022  Today's Date: 11/28/2022 PT Individual Time: 1100-1200 PT Individual Time Calculation (min): 60 min    Patient has met 7 of 7 long term goals due to improved activity tolerance, improved balance, increased strength, decreased pain, ability to compensate for deficits, and improved awareness.  Patient to discharge at a wheelchair level Supervision.   Patient's care partner is independent to provide the necessary physical and cognitive assistance at discharge.  Reasons goals not met: NA  Recommendation:  Patient will benefit from ongoing skilled PT services in  OP PT once weight-bearing status is upgraded  to continue to advance safe functional mobility, address ongoing impairments in progressing gait, global strengthening, dynamic stability, decrease pain, transfers, sit/stands, and minimize fall risk.  Equipment: 16" wide manual wheelchair with cushion- Daughter to [employer].   Reasons for discharge: treatment goals met and discharge from hospital  Patient/family agrees with progress made and goals achieved: Yes  PT Discharge Precautions/Restrictions Precautions Precautions: Knee Type of Shoulder Precautions: Per ortho- AROM allowed, "not too aggressive" Shoulder Interventions: For comfort Precaution Comments: OK for AROM at elbow, wrist, and digits only Required Braces or Orthoses: Knee Immobilizer - Left Knee Immobilizer - Left: On at all times Restrictions Weight Bearing Restrictions: Yes RUE Weight Bearing: Weight bearing as tolerated LLE Weight Bearing: Non weight bearing Other Position/Activity Restrictions: Currently wearing L knee immobilizer at all times Pain Pain Assessment Pain Scale: 0-10 Pain Score: 2  Pain Interference Pain Interference Pain Effect on Sleep: 2. Occasionally Pain  Interference with Therapy Activities: 1. Rarely or not at all Pain Interference with Day-to-Day Activities: 1. Rarely or not at all Vision/Perception  Vision - History Baseline Vision: Wears glasses only for reading Ability to See in Adequate Light: 0 Adequate Patient Visual Report: No change from baseline Perception Perception: Within Functional Limits Praxis Praxis: Intact  Cognition Overall Cognitive Status: History of cognitive impairments - at baseline Arousal/Alertness: Awake/alert Orientation Level: Oriented X4 Year: 2024 Month: June Day of Week: Correct Focused Attention: Appears intact Sustained Attention: Appears intact Selective Attention: Appears intact Memory: Impaired Memory Impairment: Decreased short term memory;Decreased recall of new information;Retrieval deficit Awareness: Appears intact Problem Solving: Impaired Safety/Judgment: Appears intact Sensation Sensation Light Touch: Appears Intact Hot/Cold: Appears Intact Additional Comments: L knee sensation more dull compared to R knee Coordination Gross Motor Movements are Fluid and Coordinated: No Fine Motor Movements are Fluid and Coordinated: Yes Coordination and Movement Description: coordination slow but improved since eval Motor  Motor Motor: Other (comment) Motor - Discharge Observations: generalized weakness improved since initial evaluation  Mobility Bed Mobility Bed Mobility: Rolling Right;Rolling Left;Supine to Sit;Sit to Supine Rolling Right: Independent Rolling Left: Independent Supine to Sit: Independent Sit to Supine: Independent Transfers Transfers: Sit to Stand;Stand to Sit;Stand Pivot Transfers;Squat Pivot Transfers Sit to Stand: Supervision/Verbal cueing Stand to Sit: Supervision/Verbal cueing Stand Pivot Transfers: Supervision/Verbal cueing Stand Pivot Transfer Details: Verbal cues for precautions/safety;Verbal cues for sequencing Squat Pivot Transfers: Supervision/Verbal  cueing Transfer (Assistive device): Rolling walker Locomotion  Gait Ambulation: Yes Gait Assistance: Supervision/Verbal cueing Gait Distance (Feet): 30 Feet Assistive device: Rolling walker Gait Assistance Details: Verbal cues for precautions/safety;Verbal cues for sequencing;Verbal cues for safe use of DME/AE Gait Gait: Yes Gait Pattern: Impaired (R shoulder pain limiting functional mobility) Gait velocity: decreased Stairs / Additional Locomotion Stairs: No Pick up small object from the  floor assist level: Supervision/Verbal cueing Pick up small object from the floor assistive device: RW and Chief Operating Officer Mobility: Yes Wheelchair Assistance: Independent with Scientist, research (life sciences): Both upper extremities Wheelchair Parts Management: Supervision/cueing Distance: 150'  Trunk/Postural Assessment  Cervical Assessment Cervical Assessment: Within Functional Limits Thoracic Assessment Thoracic Assessment: Within Functional Limits Lumbar Assessment Lumbar Assessment: Within Functional Limits Postural Control Postural Control: Deficits on evaluation Righting Reactions: Delayed but improving  Balance Balance Balance Assessed: Yes Static Sitting Balance Static Sitting - Balance Support: Bilateral upper extremity supported Static Sitting - Level of Assistance: 7: Independent Dynamic Sitting Balance Dynamic Sitting - Balance Support: Bilateral upper extremity supported Dynamic Sitting - Level of Assistance: 7: Independent Static Standing Balance Static Standing - Balance Support: Bilateral upper extremity supported Static Standing - Level of Assistance: 5: Stand by assistance Dynamic Standing Balance Dynamic Standing - Balance Support: Bilateral upper extremity supported;During functional activity Dynamic Standing - Level of Assistance: 5: Stand by assistance Extremity Assessment  RUE Assessment Active Range of Motion (AROM) Comments:  WFL elbow wrist and digit. able to actively flex to 90 degrees General Strength Comments: No MMT completed 2/2 humeral ORIF RUE Body System: Ortho LUE Assessment LUE Assessment: Within Functional Limits RLE Assessment RLE Assessment: Within Functional Limits General Strength Comments: Grossly 4/5 LLE Assessment LLE Assessment: Exceptions to [facility] General Strength Comments: Limited secondary to NWB restrictions with KI on at all times and pain limiting mobility  Skilled Intervention- Patient greeted supine in bed with daughter present and agreeable to PT treatment session. Patient transitioned to EOB independently. Patient performed lateral transfer from EOB to wheelchair with supv. New sling was ordered for the patient, however extremely bulky and not practical for use at home- Will communicate with OT and order a new sling. Patient propelled manual wheelchair ~150' ModI. Patient performed car transfer via stand pivot with RW and supv for safety. Once seated, patient was able to manage BLE and scooted posteriorly on the seat in order for LLE to be supported. Patient propelled manual wheelchair up/down a ramp with BUE and supv. Patient gait trained x30' with RW and supv for safety. Patient provided with a HEP (please see below for further details) in order to maintain strength and ROM until weight-bearing status is upgraded and patient is able to attend OPPT. Patient returned to his room and left sitting upright in wheelchair with call bell within reach,  lunch tray in front of him and all needs met.   Access Code: [email or web address] URL: [email or web address] Date: 11/28/2022 Prepared by: Sherron Ales Selisa Tensley  Exercises - Supine Gluteal Sets  - 1 x daily - 7 x weekly - 3 sets - 10 reps - Supine Ankle Pumps  - 1 x daily - 7 x weekly - 3 sets - 10 reps - Supine Quad Set  - 1 x daily - 7 x weekly - 3 sets - 10 reps - Supine Active Straight Leg Raise  - 1 x daily - 7 x weekly - 3 sets - 10 reps -  Seated Long Arc Quad  - 1 x daily - 7 x weekly - 3 sets - 10 reps - Seated March  - 1 x daily - 7 x weekly - 3 sets - 10 reps   Richard Andrews 11/28/2022, 11:28 AM

## 2022-11-28 NOTE — Plan of Care (Signed)
  Problem: Sit to Stand Goal: LTG:  Patient will perform sit to stand in prep for activites of daily living with assistance level (OT) Description: LTG:  Patient will perform sit to stand in prep for activites of daily living with assistance level (OT) Outcome: Completed/Met   Problem: RH Bathing Goal: LTG Patient will bathe all body parts with assist levels (OT) Description: LTG: Patient will bathe all body parts with assist levels (OT) Outcome: Completed/Met   Problem: RH Dressing Goal: LTG Patient will perform upper body dressing (OT) Description: LTG Patient will perform upper body dressing with assist, with/without cues (OT). Outcome: Completed/Met Goal: LTG Patient will perform lower body dressing w/assist (OT) Description: LTG: Patient will perform lower body dressing with assist, with/without cues in positioning using equipment (OT) Outcome: Completed/Met   Problem: RH Toileting Goal: LTG Patient will perform toileting task (3/3 steps) with assistance level (OT) Description: LTG: Patient will perform toileting task (3/3 steps) with assistance level (OT)  Outcome: Completed/Met   Problem: RH Toilet Transfers Goal: LTG Patient will perform toilet transfers w/assist (OT) Description: LTG: Patient will perform toilet transfers with assist, with/without cues using equipment (OT) Outcome: Completed/Met   
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
## 2022-11-28 NOTE — Plan of Care (Signed)
  Problem: RH Memory Goal: LTG Patient will use memory compensatory aids to (SLP) Description: LTG:  Patient will use memory compensatory aids to recall biographical/new, daily complex information with cues (SLP) Outcome: Not Met (add Reason) Note: Emerging success - inconsistently met at this time   Problem: RH Problem Solving Goal: LTG Patient will demonstrate problem solving for (SLP) Description: LTG:  Patient will demonstrate problem solving for basic/complex daily situations with cues  (SLP) Outcome: Completed/Met   Problem: RH Memory Goal: LTG Patient will demonstrate ability for day to day (SLP) Description: LTG:   Patient will demonstrate ability for day to day recall/carryover during cognitive/linguistic activities with assist  (SLP) Outcome: Completed/Met
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
## 2022-11-29 ENCOUNTER — Other Ambulatory Visit (HOSPITAL_COMMUNITY): Payer: Self-pay

## 2022-11-29 NOTE — Progress Notes (Signed)
Inpatient Rehabilitation Care Coordinator Discharge Note   Patient Details  Name: Richard Andrews MRN: [id number] Date of Birth: 06/16/1960   Discharge location: D/c to home  Length of Stay: 7 days  Discharge activity level: Supervision  Home/community participation: Limited  Patient response FU:XNATFT Literacy - How often do you need to have someone help you when you read instructions, pamphlets, or other written material from your doctor or pharmacy?: Never  Patient response DD:UKGURK Isolation - How often do you feel lonely or isolated from those around you?: Never  Services provided included: MD, RD, PT, SLP, OT, RN, CM, TR, Pharmacy, Neuropsych, SW  Financial Services:  Field seismologist Utilized: [employer]  Choices offered to/list presented to: N/A  Follow-up services arranged:  Other (Comment), DME (all therapies deferred until WB restrictions are lifted)      DME : [employer] for w/c    Patient response to transportation need: Is the patient able to respond to transportation needs?: Yes In the past 12 months, has lack of transportation kept you from medical appointments or from getting medications?: No In the past 12 months, has lack of transportation kept you from meetings, work, or from getting things needed for daily living?: No   Patient/Family verbalized understanding of follow-up arrangements:  Yes  Individual responsible for coordination of the follow-up plan: contact pt [phone]  or pt dtr Richard Andrews #[phone]  Confirmed correct DME delivered: Richard Andrews 11/29/2022    Comments (or additional information):fam edu completed  Summary of Stay    Date/Time Discharge Planning CSW  11/22/22 1440 TBA. Per EMR, pt will return to the home with his brother in law. Pt wife to assist with care needs despite not living together. Pt dtrs to help as well. SW will confirm there are no barriers to discharge. AAC       Richard Andrews A  Richard Andrews
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
## 2022-11-30 ENCOUNTER — Telehealth: Payer: Self-pay | Admitting: *Deleted

## 2022-11-30 NOTE — Telephone Encounter (Signed)
Transition Care Management Unsuccessful Follow-up Telephone Call  Date of discharge and from where:  11/29/22  Attempts:  1st Attempt  Reason for unsuccessful TCM follow-up call:  Left voice message   Appt:TC APPT MADE FOR 12/14/22 TO ARRIVE AT 11:20 FOR 11:40 WITH DR. Shearon Stalls** PAPERWORK MAILED

## 2022-12-09 DIAGNOSIS — M25551 Pain in right hip: Secondary | ICD-10-CM | POA: Diagnosis not present

## 2022-12-09 DIAGNOSIS — S42291D Other displaced fracture of upper end of right humerus, subsequent encounter for fracture with routine healing: Secondary | ICD-10-CM | POA: Diagnosis not present

## 2022-12-09 DIAGNOSIS — S82145A Nondisplaced bicondylar fracture of left tibia, initial encounter for closed fracture: Secondary | ICD-10-CM | POA: Diagnosis not present

## 2022-12-13 NOTE — Progress Notes (Deleted)
Subjective:    Patient ID: Richard Andrews, male    DOB: [birth year], 63 y.o.   MRN: [id number]  HPI   Pain Inventory Average Pain {NUMBERS; 0-10:5044} Pain Right Now {NUMBERS; 0-10:5044} My pain is {PAIN DESCRIPTION:21022940}  In the last 24 hours, has pain interfered with the following? General activity {NUMBERS; 0-10:5044} Relation with others {NUMBERS; 0-10:5044} Enjoyment of life {NUMBERS; 0-10:5044} What TIME of day is your pain at its worst? {time of day:24191} Sleep (in general) {BHH GOOD/FAIR/POOR:22877}  Pain is worse with: {ACTIVITIES:21022942} Pain improves with: {PAIN IMPROVES NWGN:56213086} Relief from Meds: {NUMBERS; 0-10:5044}  {MOBILITY VHQ:46962952}  {FUNCTION:21022946}  {NEURO/PSYCH:21022948}  {CPRM PRIOR STUDIES:21022953}  {CPRM PHYSICIANS INVOLVED IN YOUR CARE:21022954}    Family History  Problem Relation Age of Onset   Heart disease Mother    Hyperlipidemia Mother    Skin cancer Mother    Cancer Father    Multiple myeloma Sister    Throat cancer Sister    Social History   Socioeconomic History   Marital status: Married    Spouse name: Trula Ore   Number of children: 5   Years of education: 12   Highest education level: Not on file  Occupational History   Occupation: Emergency planning/management officer  Tobacco Use   Smoking status: Every Day    Packs/day: 1.00    Years: 10.00    Additional pack years: 0.00    Total pack years: 10.00    Types: Cigarettes   Smokeless tobacco: Current    Types: Chew  Vaping Use   Vaping Use: Never used  Substance and Sexual Activity   Alcohol use: Yes    Alcohol/week: 16.0 - 20.0 standard drinks of alcohol    Types: 16 - 20 Standard drinks or equivalent per week    Comment: 1 gallon/week   Drug use: Never   Sexual activity: Yes    Partners: Female  Other Topics Concern   Not on file  Social History Narrative   Not on file   Social Determinants of Health   Financial Resource Strain: Not on file  Food  Insecurity: Not on file  Transportation Needs: Not on file  Physical Activity: Not on file  Stress: Not on file  Social Connections: Not on file   Past Surgical History:  Procedure Laterality Date   BIOPSY  01/22/2021   Procedure: BIOPSY;  Surgeon: Shellia Cleverly, DO;  Location: [facility];  Service: Gastroenterology;;   ESOPHAGOGASTRODUODENOSCOPY (EGD) WITH PROPOFOL N/A 01/22/2021   Procedure: ESOPHAGOGASTRODUODENOSCOPY (EGD) WITH PROPOFOL;  Surgeon: Shellia Cleverly, DO;  Location: [facility];  Service: Gastroenterology;  Laterality: N/A;   I & D EXTREMITY Right 01/10/2020   Procedure: ORIF RIGHT WRIST;  Surgeon: Dominica Severin, MD;  Location: [facility];  Service: Orthopedics;  Laterality: Right;   IR ANGIO EXTERNAL CAROTID SEL EXT CAROTID UNI R MOD SED  10/04/2022   IR ANGIO INTRA EXTRACRAN SEL INTERNAL CAROTID UNI R MOD SED  09/30/2022   IR ANGIOGRAM FOLLOW UP STUDY  09/30/2022   IR NEURO EACH ADD'L AFTER BASIC UNI RIGHT (MS)  10/04/2022   IR TRANSCATH/EMBOLIZ  09/30/2022   OPEN REDUCTION INTERNAL FIXATION (ORIF) DISTAL RADIAL FRACTURE Left 01/28/2015   Procedure: OPEN REDUCTION INTERNAL FIXATION (ORIF) LEFT DISTAL RADIAL FRACTURE;  Surgeon: Tarry Kos, MD;  Location: [facility];  Service: Orthopedics;  Laterality: Left;   ORIF HUMERUS FRACTURE Right 09/27/2022   Procedure: OPEN REDUCTION INTERNAL FIXATION (ORIF) PROXIMAL HUMERUS FRACTURE;  Surgeon: Teryl Lucy,  MD;  Location: [facility];  Service: Orthopedics;  Laterality: Right;   RADIOLOGY WITH ANESTHESIA N/A 09/30/2022   Procedure: IR WITH ANESTHESIA MMA EMBOLIZATION;  Surgeon: Radiologist, Medication, MD;  Location: [facility];  Service: Radiology;  Laterality: N/A;   TONSILLECTOMY     Past Medical History:  Diagnosis Date   Allergy    Distal radius fracture, left    Hypertension    There were no vitals taken for this visit.  Opioid Risk Score:   Fall Risk Score:  `1  Depression screen [place] 2/9     02/01/2021     9:35 AM 02/06/2020    3:30 PM 12/20/2016    2:02 PM  Depression screen PHQ 2/9  Decreased Interest 0 0 2  Down, Depressed, Hopeless 0 0 0  PHQ - 2 Score 0 0 2  Altered sleeping  0 3  Tired, decreased energy  2 3  Change in appetite  3 3  Feeling bad or failure about yourself   0 3  Trouble concentrating  0 0  Moving slowly or fidgety/restless  0 2  Suicidal thoughts  0 0  PHQ-9 Score  5 16  Difficult doing work/chores  Somewhat difficult     Review of Systems     Objective:   Physical Exam        Assessment & Plan:

## 2022-12-14 ENCOUNTER — Encounter
Payer: BC Managed Care – PPO | Attending: Physical Medicine and Rehabilitation | Admitting: Physical Medicine and Rehabilitation

## 2022-12-28 DIAGNOSIS — S82145A Nondisplaced bicondylar fracture of left tibia, initial encounter for closed fracture: Secondary | ICD-10-CM | POA: Diagnosis not present

## 2023-01-06 ENCOUNTER — Encounter: Payer: Self-pay | Admitting: Medical-Surgical

## 2023-01-06 ENCOUNTER — Ambulatory Visit: Payer: BC Managed Care – PPO | Admitting: Medical-Surgical

## 2023-01-06 VITALS — BP 126/80 | HR 58 | Resp 20 | Ht 71.0 in | Wt 126.0 lb

## 2023-01-06 DIAGNOSIS — F10288 Alcohol dependence with other alcohol-induced disorder: Secondary | ICD-10-CM

## 2023-01-06 DIAGNOSIS — L89222 Pressure ulcer of left hip, stage 2: Secondary | ICD-10-CM

## 2023-01-06 DIAGNOSIS — G8921 Chronic pain due to trauma: Secondary | ICD-10-CM | POA: Diagnosis not present

## 2023-01-06 DIAGNOSIS — R5381 Other malaise: Secondary | ICD-10-CM | POA: Diagnosis not present

## 2023-01-06 MED ORDER — NALTREXONE HCL 50 MG PO TABS: 50.0000 mg | ORAL_TABLET | Freq: Every day | ORAL | 0 refills | Status: DC

## 2023-01-06 MED ORDER — DULOXETINE HCL 30 MG PO CPEP: ORAL_CAPSULE | ORAL | 1 refills | Status: DC

## 2023-01-06 MED ORDER — CHLORDIAZEPOXIDE HCL 25 MG PO CAPS: ORAL_CAPSULE | ORAL | 0 refills | Status: AC
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
## 2023-01-10 NOTE — Addendum Note (Signed)
Addended byChristen Butter on: 01/10/2023 07:39 AM   Modules accepted: Orders

## 2023-01-17 ENCOUNTER — Telehealth: Payer: Self-pay | Admitting: *Deleted

## 2023-01-17 NOTE — Progress Notes (Unsigned)
  Care Coordination  Outreach Note  01/17/2023 Name: CAEDAN WILBER MRN: [id number] DOB: Jul 24, 1960   Care Coordination Outreach Attempts: An unsuccessful telephone outreach was attempted today to offer the patient information about available care coordination services.  Follow Up Plan:  Additional outreach attempts will be made to offer the patient care coordination information and services.   Encounter Outcome:  No Answer  Burman Nieves, CCMA Care Coordination Care Guide Direct Dial: [phone]

## 2023-01-18 NOTE — Progress Notes (Unsigned)
  Care Coordination  Outreach Note  01/18/2023 Name: Richard Andrews MRN: [id number] DOB: 09/26/1960   Care Coordination Outreach Attempts: A second unsuccessful outreach was attempted today to offer the patient with information about available care coordination services.  Follow Up Plan:  Additional outreach attempts will be made to offer the patient care coordination information and services.   Encounter Outcome:  No Answer  Burman Nieves, CCMA Care Coordination Care Guide Direct Dial: [phone]

## 2023-01-19 NOTE — Progress Notes (Signed)
  Care Coordination  Outreach Note  01/19/2023 Name: Richard Andrews MRN: [id number] DOB: [birth year]   Care Coordination Outreach Attempts: A third unsuccessful outreach was attempted today to offer the patient with information about available care coordination services.  Follow Up Plan:  No further outreach attempts will be made at this time. We have been unable to contact the patient to offer or enroll patient in care coordination services  Encounter Outcome:  No Answer  Burman Nieves, [facility] Care Coordination Care Guide Direct Dial: [phone]

## 2023-01-22 ENCOUNTER — Emergency Department (HOSPITAL_COMMUNITY): Payer: BC Managed Care – PPO

## 2023-01-22 ENCOUNTER — Other Ambulatory Visit: Payer: Self-pay

## 2023-01-22 ENCOUNTER — Inpatient Hospital Stay (HOSPITAL_COMMUNITY)
Admission: EM | Admit: 2023-01-22 | Discharge: 2023-02-06 | DRG: 987 | Disposition: A | Discharge status: Skilled Nursing Facility | Payer: BC Managed Care – PPO | Attending: Internal Medicine | Admitting: Internal Medicine

## 2023-01-22 ENCOUNTER — Encounter (HOSPITAL_COMMUNITY): Payer: Self-pay | Admitting: Emergency Medicine

## 2023-01-22 DIAGNOSIS — R5381 Other malaise: Secondary | ICD-10-CM | POA: Diagnosis present

## 2023-01-22 DIAGNOSIS — R531 Weakness: Secondary | ICD-10-CM | POA: Diagnosis not present

## 2023-01-22 DIAGNOSIS — G9389 Other specified disorders of brain: Secondary | ICD-10-CM | POA: Diagnosis not present

## 2023-01-22 DIAGNOSIS — Z66 Do not resuscitate: Secondary | ICD-10-CM | POA: Diagnosis present

## 2023-01-22 DIAGNOSIS — F1020 Alcohol dependence, uncomplicated: Secondary | ICD-10-CM | POA: Diagnosis present

## 2023-01-22 DIAGNOSIS — R7881 Bacteremia: Secondary | ICD-10-CM | POA: Diagnosis not present

## 2023-01-22 DIAGNOSIS — Z79899 Other long term (current) drug therapy: Secondary | ICD-10-CM

## 2023-01-22 DIAGNOSIS — D539 Nutritional anemia, unspecified: Secondary | ICD-10-CM | POA: Diagnosis present

## 2023-01-22 DIAGNOSIS — E872 Acidosis, unspecified: Secondary | ICD-10-CM | POA: Diagnosis not present

## 2023-01-22 DIAGNOSIS — F10239 Alcohol dependence with withdrawal, unspecified: Secondary | ICD-10-CM | POA: Diagnosis present

## 2023-01-22 DIAGNOSIS — N1330 Unspecified hydronephrosis: Secondary | ICD-10-CM | POA: Diagnosis not present

## 2023-01-22 DIAGNOSIS — R0682 Tachypnea, not elsewhere classified: Secondary | ICD-10-CM | POA: Diagnosis not present

## 2023-01-22 DIAGNOSIS — I10 Essential (primary) hypertension: Secondary | ICD-10-CM

## 2023-01-22 DIAGNOSIS — L89152 Pressure ulcer of sacral region, stage 2: Secondary | ICD-10-CM | POA: Diagnosis present

## 2023-01-22 DIAGNOSIS — K7030 Alcoholic cirrhosis of liver without ascites: Secondary | ICD-10-CM | POA: Diagnosis present

## 2023-01-22 DIAGNOSIS — K852 Alcohol induced acute pancreatitis without necrosis or infection: Secondary | ICD-10-CM | POA: Diagnosis present

## 2023-01-22 DIAGNOSIS — F17210 Nicotine dependence, cigarettes, uncomplicated: Secondary | ICD-10-CM | POA: Diagnosis present

## 2023-01-22 DIAGNOSIS — K858 Other acute pancreatitis without necrosis or infection: Secondary | ICD-10-CM | POA: Diagnosis not present

## 2023-01-22 DIAGNOSIS — A0472 Enterocolitis due to Clostridium difficile, not specified as recurrent: Secondary | ICD-10-CM

## 2023-01-22 DIAGNOSIS — A040 Enteropathogenic Escherichia coli infection: Secondary | ICD-10-CM | POA: Diagnosis present

## 2023-01-22 DIAGNOSIS — D509 Iron deficiency anemia, unspecified: Secondary | ICD-10-CM | POA: Diagnosis present

## 2023-01-22 DIAGNOSIS — F39 Unspecified mood [affective] disorder: Secondary | ICD-10-CM | POA: Diagnosis present

## 2023-01-22 DIAGNOSIS — K760 Fatty (change of) liver, not elsewhere classified: Secondary | ICD-10-CM | POA: Diagnosis present

## 2023-01-22 DIAGNOSIS — N400 Enlarged prostate without lower urinary tract symptoms: Secondary | ICD-10-CM | POA: Diagnosis present

## 2023-01-22 DIAGNOSIS — D6959 Other secondary thrombocytopenia: Secondary | ICD-10-CM | POA: Diagnosis present

## 2023-01-22 DIAGNOSIS — N132 Hydronephrosis with renal and ureteral calculous obstruction: Secondary | ICD-10-CM | POA: Diagnosis not present

## 2023-01-22 DIAGNOSIS — S065XAA Traumatic subdural hemorrhage with loss of consciousness status unknown, initial encounter: Secondary | ICD-10-CM | POA: Diagnosis not present

## 2023-01-22 DIAGNOSIS — Z8249 Family history of ischemic heart disease and other diseases of the circulatory system: Secondary | ICD-10-CM

## 2023-01-22 DIAGNOSIS — A4181 Sepsis due to Enterococcus: Secondary | ICD-10-CM | POA: Diagnosis present

## 2023-01-22 DIAGNOSIS — E8729 Other acidosis: Secondary | ICD-10-CM | POA: Diagnosis not present

## 2023-01-22 DIAGNOSIS — F1010 Alcohol abuse, uncomplicated: Secondary | ICD-10-CM

## 2023-01-22 DIAGNOSIS — D696 Thrombocytopenia, unspecified: Secondary | ICD-10-CM | POA: Diagnosis not present

## 2023-01-22 DIAGNOSIS — E876 Hypokalemia: Secondary | ICD-10-CM

## 2023-01-22 DIAGNOSIS — B952 Enterococcus as the cause of diseases classified elsewhere: Secondary | ICD-10-CM | POA: Diagnosis not present

## 2023-01-22 DIAGNOSIS — W19XXXA Unspecified fall, initial encounter: Secondary | ICD-10-CM | POA: Diagnosis present

## 2023-01-22 DIAGNOSIS — Z8711 Personal history of peptic ulcer disease: Secondary | ICD-10-CM

## 2023-01-22 DIAGNOSIS — R000 Tachycardia, unspecified: Secondary | ICD-10-CM | POA: Diagnosis not present

## 2023-01-22 DIAGNOSIS — Z8659 Personal history of other mental and behavioral disorders: Secondary | ICD-10-CM

## 2023-01-22 DIAGNOSIS — E860 Dehydration: Secondary | ICD-10-CM

## 2023-01-22 DIAGNOSIS — I6200 Nontraumatic subdural hemorrhage, unspecified: Secondary | ICD-10-CM | POA: Diagnosis not present

## 2023-01-22 DIAGNOSIS — S065X0A Traumatic subdural hemorrhage without loss of consciousness, initial encounter: Secondary | ICD-10-CM | POA: Diagnosis not present

## 2023-01-22 DIAGNOSIS — R509 Fever, unspecified: Secondary | ICD-10-CM | POA: Diagnosis not present

## 2023-01-22 DIAGNOSIS — R748 Abnormal levels of other serum enzymes: Secondary | ICD-10-CM | POA: Diagnosis not present

## 2023-01-22 DIAGNOSIS — I609 Nontraumatic subarachnoid hemorrhage, unspecified: Secondary | ICD-10-CM | POA: Diagnosis not present

## 2023-01-22 DIAGNOSIS — R410 Disorientation, unspecified: Secondary | ICD-10-CM

## 2023-01-22 DIAGNOSIS — E871 Hypo-osmolality and hyponatremia: Secondary | ICD-10-CM | POA: Diagnosis not present

## 2023-01-22 DIAGNOSIS — L97129 Non-pressure chronic ulcer of left thigh with unspecified severity: Secondary | ICD-10-CM | POA: Diagnosis present

## 2023-01-22 DIAGNOSIS — G9341 Metabolic encephalopathy: Secondary | ICD-10-CM | POA: Diagnosis present

## 2023-01-22 DIAGNOSIS — Z87898 Personal history of other specified conditions: Secondary | ICD-10-CM

## 2023-01-22 DIAGNOSIS — N136 Pyonephrosis: Secondary | ICD-10-CM | POA: Diagnosis present

## 2023-01-22 DIAGNOSIS — K7689 Other specified diseases of liver: Secondary | ICD-10-CM | POA: Diagnosis not present

## 2023-01-22 DIAGNOSIS — Z960 Presence of urogenital implants: Secondary | ICD-10-CM | POA: Diagnosis not present

## 2023-01-22 DIAGNOSIS — M4802 Spinal stenosis, cervical region: Secondary | ICD-10-CM | POA: Diagnosis not present

## 2023-01-22 DIAGNOSIS — R4182 Altered mental status, unspecified: Secondary | ICD-10-CM | POA: Diagnosis not present

## 2023-01-22 DIAGNOSIS — Z9181 History of falling: Secondary | ICD-10-CM

## 2023-01-22 DIAGNOSIS — E46 Unspecified protein-calorie malnutrition: Secondary | ICD-10-CM

## 2023-01-22 DIAGNOSIS — S82142A Displaced bicondylar fracture of left tibia, initial encounter for closed fracture: Secondary | ICD-10-CM | POA: Diagnosis not present

## 2023-01-22 LAB — I-STAT CG4 LACTIC ACID, ED
Lactic Acid, Venous: 2.8 mmol/L (ref 0.5–1.9)
Lactic Acid, Venous: 4.2 mmol/L (ref 0.5–1.9)

## 2023-01-22 LAB — URINALYSIS, ROUTINE W REFLEX MICROSCOPIC
Bilirubin Urine: NEGATIVE
Glucose, UA: 50 mg/dL — AB
Ketones, ur: NEGATIVE mg/dL
Nitrite: NEGATIVE
Protein, ur: 100 mg/dL — AB
Specific Gravity, Urine: 1.021 (ref 1.005–1.030)
Trans Epithel, UA: <1
pH: 5 (ref 5.0–8.0)

## 2023-01-22 LAB — PROTIME-INR
INR: 1.2 (ref 0.8–1.2)
Prothrombin Time: 15.1 seconds (ref 11.4–15.2)

## 2023-01-22 LAB — I-STAT VENOUS BLOOD GAS, ED
Acid-Base Excess: 1 mmol/L (ref 0.0–2.0)
Bicarbonate: 24.6 mmol/L (ref 20.0–28.0)
Calcium, Ion: 1.18 mmol/L (ref 1.15–1.40)
HCT: 35 % — ABNORMAL LOW (ref 39.0–52.0)
Hemoglobin: 11.9 g/dL — ABNORMAL LOW (ref 13.0–17.0)
O2 Saturation: 97 %
Potassium: 2.7 mmol/L — CL (ref 3.5–5.1)
Sodium: 133 mmol/L — ABNORMAL LOW (ref 135–145)
TCO2: 26 mmol/L (ref 22–32)
pCO2, Ven: 33.9 mmHg — ABNORMAL LOW (ref 44–60)
pH, Ven: 7.468 — ABNORMAL HIGH (ref 7.25–7.43)
pO2, Ven: 80 mmHg — ABNORMAL HIGH (ref 32–45)

## 2023-01-22 LAB — RAPID URINE DRUG SCREEN, HOSP PERFORMED
Amphetamines: NOT DETECTED
Barbiturates: NOT DETECTED
Benzodiazepines: NOT DETECTED
Cocaine: NOT DETECTED
Opiates: NOT DETECTED
Tetrahydrocannabinol: NOT DETECTED

## 2023-01-22 LAB — CBC WITH DIFFERENTIAL/PLATELET
Abs Immature Granulocytes: 0.05 10*3/uL (ref 0.00–0.07)
Basophils Absolute: 0 10*3/uL (ref 0.0–0.1)
Basophils Relative: 0 %
Eosinophils Absolute: 0 10*3/uL (ref 0.0–0.5)
Eosinophils Relative: 0 %
HCT: 36.4 % — ABNORMAL LOW (ref 39.0–52.0)
Hemoglobin: 12.5 g/dL — ABNORMAL LOW (ref 13.0–17.0)
Immature Granulocytes: 1 %
Lymphocytes Relative: 5 %
Lymphs Abs: 0.4 10*3/uL — ABNORMAL LOW (ref 0.7–4.0)
MCH: 34.6 pg — ABNORMAL HIGH (ref 26.0–34.0)
MCHC: 34.3 g/dL (ref 30.0–36.0)
MCV: 100.8 fL — ABNORMAL HIGH (ref 80.0–100.0)
Monocytes Absolute: 1.4 10*3/uL — ABNORMAL HIGH (ref 0.1–1.0)
Monocytes Relative: 17 %
Neutro Abs: 6.5 10*3/uL (ref 1.7–7.7)
Neutrophils Relative %: 77 %
Platelets: UNDETERMINED 10*3/uL (ref 150–400)
RBC: 3.61 MIL/uL — ABNORMAL LOW (ref 4.22–5.81)
RDW: 14.8 % (ref 11.5–15.5)
WBC: 8.5 10*3/uL (ref 4.0–10.5)
nRBC: 0 % (ref 0.0–0.2)

## 2023-01-22 LAB — COMPREHENSIVE METABOLIC PANEL
ALT: 24 U/L (ref 0–44)
AST: 66 U/L — ABNORMAL HIGH (ref 15–41)
Albumin: 3.4 g/dL — ABNORMAL LOW (ref 3.5–5.0)
Alkaline Phosphatase: 80 U/L (ref 38–126)
Anion gap: 17 — ABNORMAL HIGH (ref 5–15)
BUN: 29 mg/dL — ABNORMAL HIGH (ref 8–23)
CO2: 21 mmol/L — ABNORMAL LOW (ref 22–32)
Calcium: 9.7 mg/dL (ref 8.9–10.3)
Chloride: 92 mmol/L — ABNORMAL LOW (ref 98–111)
Creatinine, Ser: 1.13 mg/dL (ref 0.61–1.24)
GFR, Estimated: >60 mL/min (ref >60)
Glucose, Bld: 253 mg/dL — ABNORMAL HIGH (ref 70–99)
Potassium: 2.5 mmol/L — CL (ref 3.5–5.1)
Sodium: 130 mmol/L — ABNORMAL LOW (ref 135–145)
Total Bilirubin: 1.5 mg/dL — ABNORMAL HIGH (ref 0.3–1.2)
Total Protein: 7.6 g/dL (ref 6.5–8.1)

## 2023-01-22 LAB — MAGNESIUM: Magnesium: 1.3 mg/dL — ABNORMAL LOW (ref 1.7–2.4)

## 2023-01-22 LAB — AMMONIA: Ammonia: 35 umol/L (ref 9–35)

## 2023-01-22 LAB — ETHANOL: Alcohol, Ethyl (B): <10 mg/dL (ref <10)

## 2023-01-22 LAB — LIPASE, BLOOD: Lipase: 156 U/L — ABNORMAL HIGH (ref 11–51)

## 2023-01-22 MED ORDER — THIAMINE HCL 100 MG/ML IJ SOLN
100.0000 mg | Freq: Every day | INTRAMUSCULAR | Status: DC
  Filled 2023-01-22 (×3): qty 2

## 2023-01-22 MED ORDER — THIAMINE MONONITRATE 100 MG PO TABS
100.0000 mg | ORAL_TABLET | Freq: Every day | ORAL | Status: DC
  Administered 2023-01-22 – 2023-02-06 (×16): 100 mg via ORAL
  Filled 2023-01-22 (×16): qty 1

## 2023-01-22 MED ORDER — SODIUM CHLORIDE 0.9 % IV BOLUS
500.0000 mL | Freq: Once | INTRAVENOUS | Status: AC
  Administered 2023-01-22: 500 mL via INTRAVENOUS

## 2023-01-22 MED ORDER — LORAZEPAM 1 MG PO TABS
0.0000 mg | ORAL_TABLET | Freq: Two times a day (BID) | ORAL | Status: AC
  Administered 2023-01-25 – 2023-01-26 (×2): 1 mg via ORAL
  Filled 2023-01-22 (×2): qty 1

## 2023-01-22 MED ORDER — LORAZEPAM 1 MG PO TABS
0.0000 mg | ORAL_TABLET | Freq: Four times a day (QID) | ORAL | Status: AC
  Administered 2023-01-22 – 2023-01-23 (×3): 1 mg via ORAL
  Filled 2023-01-22 (×3): qty 1

## 2023-01-22 MED ORDER — POTASSIUM CHLORIDE 10 MEQ/100ML IV SOLN
10.0000 meq | Freq: Once | INTRAVENOUS | Status: AC
  Administered 2023-01-22: 10 meq via INTRAVENOUS
  Filled 2023-01-22: qty 100

## 2023-01-22 MED ORDER — ACETAMINOPHEN 325 MG PO TABS
325.0000 mg | ORAL_TABLET | Freq: Once | ORAL | Status: AC
  Administered 2023-01-22: 325 mg via ORAL
  Filled 2023-01-22: qty 1

## 2023-01-22 MED ORDER — LORAZEPAM 2 MG/ML IJ SOLN: 0.0000 mg | Freq: Two times a day (BID) | INTRAMUSCULAR | Status: AC

## 2023-01-22 MED ORDER — LORAZEPAM 2 MG/ML IJ SOLN
0.0000 mg | Freq: Four times a day (QID) | INTRAMUSCULAR | Status: AC
  Administered 2023-01-22: 1 mg via INTRAVENOUS
  Filled 2023-01-22: qty 1

## 2023-01-22 MED ORDER — SODIUM CHLORIDE 0.9 % IV SOLN: INTRAVENOUS | Status: DC

## 2023-01-22 MED ORDER — MAGNESIUM OXIDE -MG SUPPLEMENT 400 (240 MG) MG PO TABS
400.0000 mg | ORAL_TABLET | Freq: Every day | ORAL | Status: DC
  Administered 2023-01-22 – 2023-01-24 (×3): 400 mg via ORAL
  Filled 2023-01-22 (×3): qty 1

## 2023-01-22 MED ORDER — POTASSIUM CHLORIDE CRYS ER 20 MEQ PO TBCR: 40.0000 meq | EXTENDED_RELEASE_TABLET | Freq: Two times a day (BID) | ORAL | Status: DC
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
## 2023-01-22 NOTE — Progress Notes (Signed)
62 yo M with alcoholism, known right chronic loculated SDH presented with AMS.  CT head shows his SDH has decreased in size compared with 2 months ago. No neurosurgical intervention indicated. He will need to f/u with Dr. Barnett Abu of neurosurgery.

## 2023-01-22 NOTE — ED Provider Notes (Signed)
[place] EMERGENCY DEPARTMENT AT [facility] Provider Note   CSN: [id number] Arrival date & time: 01/22/23  1510     History  Chief Complaint  Patient presents with   Altered Mental Status    MATEUS MCFARLING is a 62 y.o. male.  Patient is a 62 yo male with pmh of alcohol abuse presenting to ED via EMS after son called due to being unable to get ahold of him. EMS reports that patient was in his home, confused, and laying in feces. Patient states he is a daily drinker and normally drinks 1/2 a fifth whiskey daily. States he drank a pint this morning. Unable to anserw any other questions at this time.   Wife at bedside, states patient was given clonazepam taper from pcp to help with alcohol abuse but has not started taking it. Admits to decreased appetite over the past month taking only 1-2 bites of food a day. States pt weighted 120 two weeks ago. Patient denies nausea, vomiting, or abdominal pain. Pt's wife states it's due to the drinking. Pt also has old bruising from previous fall on right knee. Denies pain. Left upper medial thigh bed sore as per wife. Wife goes to house 2-3 times a week to help patient with bathing/cleaning. States over the past several months he has had decreased ambulation including unable to get up from a chair unassisted.   CT abd/pelvis imaging June 2024 demonstrates hepatosteatosis only.   The history is provided by the patient. No language interpreter was used.  Altered Mental Status Presenting symptoms: confusion   Associated symptoms: weakness (unable to get up from chair for several weeks)   Associated symptoms: no abdominal pain, no fever, no palpitations, no rash, no seizures and no vomiting        Home Medications Prior to Admission medications   Medication Sig Start Date End Date Taking? Authorizing Provider  DULoxetine (CYMBALTA) 30 MG capsule Take 1 capsule (30mg ) daily for one week then increase to 1 tablet (30mg ) twice daily. 01/06/23    [name], NP  naltrexone (DEPADE) 50 MG tablet Take 1 tablet (50 mg total) by mouth daily. 01/06/23   [name], NP  pantoprazole (PROTONIX) 40 MG tablet Take 1 tablet (40 mg total) by mouth daily. 11/28/22   Angiulli, Mcarthur Rossetti, PA-C      Allergies    Patient has no known allergies.    Review of Systems   Review of Systems  Constitutional:  Positive for appetite change (and weight loss). Negative for chills and fever.  HENT:  Negative for ear pain and sore throat.   Eyes:  Negative for pain and visual disturbance.  Respiratory:  Negative for cough and shortness of breath.   Cardiovascular:  Negative for chest pain and palpitations.  Gastrointestinal:  Negative for abdominal pain and vomiting.  Genitourinary:  Negative for dysuria and hematuria.  Musculoskeletal:  Negative for arthralgias and back pain.  Skin:  Positive for color change (bruising). Negative for rash.  Neurological:  Positive for weakness (unable to get up from chair for several weeks). Negative for seizures and syncope.  Psychiatric/Behavioral:  Positive for confusion.   All other systems reviewed and are negative.   Physical Exam Updated Vital Signs BP (!) 126/95   Pulse 100   Temp 100.1 F (37.8 C) (Oral)   Resp 16   SpO2 100%  Physical Exam Vitals and nursing note reviewed.  Constitutional:      General: He is not in  acute distress.    Appearance: He is underweight.     Comments: Disheveled appearance. Feces right shoulder, knees, and under garments.   HENT:     Head: Normocephalic and atraumatic.  Eyes:     Conjunctiva/sclera: Conjunctivae normal.  Cardiovascular:     Rate and Rhythm: Normal rate and regular rhythm.     Heart sounds: No murmur heard. Pulmonary:     Effort: Pulmonary effort is normal. No respiratory distress.     Breath sounds: Normal breath sounds.  Chest:     Comments: Underweight. Able to count ribs Abdominal:     Palpations: Abdomen is soft.     Tenderness: There is no  abdominal tenderness.     Comments: Soft and nontender  Musculoskeletal:        General: No swelling.     Cervical back: Neck supple.     Right lower leg: No edema.     Left lower leg: No edema.  Skin:    General: Skin is warm and dry.     Capillary Refill: Capillary refill takes less than 2 seconds.       Neurological:     Mental Status: He is alert. He is confused.     GCS: GCS eye subscore is 4. GCS verbal subscore is 4. GCS motor subscore is 6.     Cranial Nerves: Cranial nerves 2-12 are intact.     Sensory: Sensation is intact.     Motor: Motor function is intact.     Comments: Alert, oriented to person and place. Confused on time. Unable to give clear details of today's events. No facial asymmetry. PERRLA. Moving all four extremities spontaneously. No sensation deficits. Able to follow commands.   Psychiatric:        Mood and Affect: Mood normal.     ED Results / Procedures / Treatments   Labs (all labs ordered are listed, but only abnormal results are displayed) Labs Reviewed  CBC WITH DIFFERENTIAL/PLATELET - Abnormal; Notable for the following components:      Result Value   RBC 3.61 (*)    Hemoglobin 12.5 (*)    HCT 36.4 (*)    MCV 100.8 (*)    MCH 34.6 (*)    Lymphs Abs 0.4 (*)    Monocytes Absolute 1.4 (*)    All other components within normal limits  LIPASE, BLOOD - Abnormal; Notable for the following components:   Lipase 156 (*)    All other components within normal limits  MAGNESIUM - Abnormal; Notable for the following components:   Magnesium 1.3 (*)    All other components within normal limits  COMPREHENSIVE METABOLIC PANEL - Abnormal; Notable for the following components:   Sodium 130 (*)    Potassium 2.5 (*)    Chloride 92 (*)    CO2 21 (*)    Glucose, Bld 253 (*)    BUN 29 (*)    Albumin 3.4 (*)    AST 66 (*)    Total Bilirubin 1.5 (*)    Anion gap 17 (*)    All other components within normal limits  I-STAT CG4 LACTIC ACID, ED - Abnormal;  Notable for the following components:   Lactic Acid, Venous 2.8 (*)    All other components within normal limits  I-STAT CG4 LACTIC ACID, ED - Abnormal; Notable for the following components:   Lactic Acid, Venous 4.2 (*)    All other components within normal limits  I-STAT VENOUS BLOOD GAS, ED -  Abnormal; Notable for the following components:   pH, Ven 7.468 (*)    pCO2, Ven 33.9 (*)    pO2, Ven 80 (*)    Sodium 133 (*)    Potassium 2.7 (*)    HCT 35.0 (*)    Hemoglobin 11.9 (*)    All other components within normal limits  ETHANOL  PROTIME-INR  AMMONIA  RAPID URINE DRUG SCREEN, HOSP PERFORMED  URINALYSIS, ROUTINE W REFLEX MICROSCOPIC  BLOOD GAS, VENOUS    EKG None  Radiology CT Cervical Spine Wo Contrast  Result Date: 01/22/2023 CLINICAL DATA:  Altered mental status.  Subdural hematoma. EXAM: CT CERVICAL SPINE WITHOUT CONTRAST TECHNIQUE: Multidetector CT imaging of the cervical spine was performed without intravenous contrast. Multiplanar CT image reconstructions were also generated. RADIATION DOSE REDUCTION: This exam was performed according to the departmental dose-optimization program which includes automated exposure control, adjustment of the mA and/or kV according to patient size and/or use of iterative reconstruction technique. COMPARISON:  CT of the cervical spine 11/15/2022 FINDINGS: Alignment: Slight degenerative anterolisthesis again noted at C3-4. Straightening of the normal cervical lordosis is stable. Skull base and vertebrae: The craniocervical junction is normal. Vertebral body heights are normal. No acute or healing fractures are present. Soft tissues and spinal canal: No prevertebral fluid or swelling. No visible canal hematoma. Disc levels: Multilevel degenerative changes of the cervical spine are similar to the prior exam. Foraminal narrowing is greatest on the right at C3-4, on the left at C4-5 and C5-6 and on the right at C6-7. Upper chest: The lung apices are  clear. Stable pleural calcifications are noted at both lung apices. IMPRESSION: 1. No acute or healing fractures. 2. Multilevel degenerative changes of the cervical spine are similar to the prior exam. Electronically Signed   By: Marin Roberts M.D.   On: 01/22/2023 16:49   CT Head Wo Contrast  Result Date: 01/22/2023 CLINICAL DATA:  Mental status. EXAM: CT HEAD WITHOUT CONTRAST TECHNIQUE: Contiguous axial images were obtained from the base of the skull through the vertex without intravenous contrast. RADIATION DOSE REDUCTION: This exam was performed according to the departmental dose-optimization program which includes automated exposure control, adjustment of the mA and/or kV according to patient size and/or use of iterative reconstruction technique. COMPARISON:  CT head 11/15/2022 FINDINGS: Brain: The previously seen subdural hematoma over the right frontal and parietal convexity is slightly decreased in size. Layering hyperdensity likely represents septi. A new more anterior extra-axial collection is present, consistent with a new subdural hematoma with layering blood products measuring approximately 9 mm. The new hemorrhage creates some local mass effect with partial effacement sulci. However the previously seen midline shift has resolved. The ventricles are of normal size. No acute cortical infarct is present. The deep brain nuclei are within normal limits. The brainstem and cerebellum are within normal limits. Midline structures are within normal limits. Vascular: No hyperdense vessel or unexpected calcification. Skull: Calvarium is intact. No focal lytic or blastic lesions are present. No significant extracranial soft tissue lesion is present. Sinuses/Orbits: The paranasal sinuses and mastoid air cells are clear. The globes and orbits are within normal limits. IMPRESSION: 1. New more anterior extra-axial collection, consistent with a new subdural hematoma with layering blood products measuring  approximately 9 mm. 2. The new hemorrhage creates some local mass effect with partial effacement sulci. However the previously seen midline shift has resolved. 3. The previously seen subdural hematoma over the right frontal and parietal convexity has decreased in size. No residual  midline shift is present. 4. No acute cortical infarct. These results were called by telephone at the time of interpretation on 01/22/2023 at 4:40 pm to provider Dr. Edwin Dada , who verbally acknowledged these results. Electronically Signed   By: Marin Roberts M.D.   On: 01/22/2023 16:40    Procedures .Critical Care  Performed by: Franne Forts, DO Authorized by: Franne Forts, DO   Critical care provider statement:    Critical care time (minutes):  101   Critical care was necessary to treat or prevent imminent or life-threatening deterioration of the following conditions:  Trauma   Critical care was time spent personally by me on the following activities:  Development of treatment plan with patient or surrogate, discussions with consultants, evaluation of patient's response to treatment, examination of patient, ordering and review of laboratory studies, ordering and review of radiographic studies, ordering and performing treatments and interventions, pulse oximetry, re-evaluation of patient's condition and review of old charts   Care discussed with: admitting provider     Care discussed with comment:  Admitting, trauma team, and neurosurgery     Medications Ordered in ED Medications  magnesium oxide (MAG-OX) tablet 400 mg (400 mg Oral Given 01/22/23 2039)  0.9 %  sodium chloride infusion (has no administration in time range)  LORazepam (ATIVAN) injection 0-4 mg (1 mg Intravenous Given 01/22/23 2033)    Or  LORazepam (ATIVAN) tablet 0-4 mg ( Oral See Alternative 01/22/23 2033)  LORazepam (ATIVAN) injection 0-4 mg (has no administration in time range)    Or  LORazepam (ATIVAN) tablet 0-4 mg (has no  administration in time range)  thiamine (VITAMIN B1) tablet 100 mg (100 mg Oral Given 01/22/23 2038)    Or  thiamine (VITAMIN B1) injection 100 mg ( Intravenous See Alternative 01/22/23 2038)  potassium chloride 10 mEq in 100 mL IVPB (has no administration in time range)  potassium chloride SA (KLOR-CON M) CR tablet 40 mEq (has no administration in time range)  acetaminophen (TYLENOL) tablet 325 mg (325 mg Oral Given 01/22/23 1638)  sodium chloride 0.9 % bolus 500 mL (0 mLs Intravenous Stopped 01/22/23 2112)  sodium chloride 0.9 % bolus 500 mL (0 mLs Intravenous Stopped 01/22/23 2112)    ED Course/ Medical Decision Making/ A&P                                 Medical Decision Making Amount and/or Complexity of Data Reviewed Labs: ordered. Radiology: ordered.  Risk OTC drugs. Prescription drug management. Decision regarding hospitalization.   62 year old male with past medical history of liver steatosis and alcohol abuse presenting from home after family unable to reach for 4 hours.  Patient was found confused and in feces.  On my evaluation patient is pleasantly confused, alert and oriented x 2 to person and place, GCS 14, moving all 4 extremities spontaneously.  Disheveled appearance.  Stable vital signs.  No sensation or motor deficits.  No facial asymmetry. CTH ordered and pending. Ecchymosis of right knee present but non-tender. Will not get imaging at this time.  Small healing wound on left proximal medial thigh without cellulitis or drainage at this time.   Chart review demonstrates CT abd/pelvis imaging June 2024 demonstrates hepatosteatosis only. No cirrhosis at this time. Patient also admitted in June 2024 for traumatic subdural.   9:29 PM Call from radiologist. CT head concerning for a acute anterior extra-axial and millimeter subdural hematoma  doubt midline shift.  Page sent to trauma surgery and neurosurgery team for consults.  Trauma surgery call back-will defer to neurosurg  and medicine.  Official read for CT scan up. Some midline shift. Neurosurgery with Dr. Maisie Fus on board. Will stay on consult. Requesting step down. Requesting hospitalist admit.   Labs concerning for lactic acidosis likely secondary to dehydration and downtime of possibly 4 days. pH stable. Patient does have low grade temperature of 100.0 F on arrival. Possibly secondary to brain hemorrhage. Will continue to monitor. No leukocytosis. Will hold antibiotics for sepsis criteria.  Will add on CPK. Hypomagnesia, hypokalemia, and hyponatremia present likely secondary to chronic alcoholism. 1 L normal saline bolus started and maintaince fluids at 150 ml/hr started. Magnesium and potassium replaced. Lipase elevated. Abdomen remains non-tender throughout ED stay. Consider likely acute versus chronic pancreatitis.  Recommending admission for confusion, acute subdural hemorrhage with hyponatremia/hypomagnesia/hypokalemia in the setting of alcohol abuse and malnourished/dehydrated state without alcoholic ketoacidosis. Monitor temperatures for s/s sepsis. Hospitalist team with Dr. Jaynee Eagles accepting patient.   Final Clinical Impression(s) / ED Diagnoses Final diagnoses:  Subdural hematoma (HCC)  Alcohol abuse  Confusion  Hypomagnesemia  Hypokalemia  Dehydration  Malnutrition, unspecified type (HCC)  Lactic acidosis    Rx / DC Orders ED Discharge Orders     None         Franne Forts, DO 01/22/23 2129    Edwin Dada P, DO 01/23/23 1520

## 2023-01-22 NOTE — Hospital Course (Signed)
  4. No acute cortical infarct.  CT C-spine nontraumatic Patient treated with NS bolus, thiamine, lorazepam per CIWA protocol and given potassium and magnesium supplementation Hospitalist consulted for admission.

## 2023-01-22 NOTE — ED Notes (Signed)
Pt given bed bath and clean gown/linen change.  Significant skin breakdown to scrotum noted.  Small black nickel-sized closed wound to inner left thigh.  Redness to sacrum, iliac crests, and gluteal fold noted.

## 2023-01-22 NOTE — ED Notes (Signed)
Pt to CT

## 2023-01-23 ENCOUNTER — Inpatient Hospital Stay (HOSPITAL_COMMUNITY): Payer: BC Managed Care – PPO | Admitting: Certified Registered Nurse Anesthetist

## 2023-01-23 ENCOUNTER — Other Ambulatory Visit: Payer: Self-pay | Admitting: Urology

## 2023-01-23 ENCOUNTER — Inpatient Hospital Stay (HOSPITAL_COMMUNITY): Payer: BC Managed Care – PPO

## 2023-01-23 ENCOUNTER — Encounter (HOSPITAL_COMMUNITY)
Admission: EM | Disposition: A | Discharge status: Skilled Nursing Facility | Payer: Self-pay | Source: Home / Self Care | Attending: Internal Medicine

## 2023-01-23 ENCOUNTER — Encounter (HOSPITAL_COMMUNITY): Payer: Self-pay | Admitting: Internal Medicine

## 2023-01-23 DIAGNOSIS — S065XAA Traumatic subdural hemorrhage with loss of consciousness status unknown, initial encounter: Secondary | ICD-10-CM | POA: Diagnosis not present

## 2023-01-23 DIAGNOSIS — Z87898 Personal history of other specified conditions: Secondary | ICD-10-CM

## 2023-01-23 DIAGNOSIS — Z8711 Personal history of peptic ulcer disease: Secondary | ICD-10-CM

## 2023-01-23 HISTORY — PX: CYSTOSCOPY W/ URETERAL STENT PLACEMENT: SHX1429

## 2023-01-23 LAB — LACTIC ACID, PLASMA
Lactic Acid, Venous: 1.6 mmol/L (ref 0.5–1.9)
Lactic Acid, Venous: 1.4 mmol/L (ref 0.5–1.9)

## 2023-01-23 LAB — POTASSIUM
Potassium: 3.5 mmol/L (ref 3.5–5.1)
Potassium: 3.3 mmol/L — ABNORMAL LOW (ref 3.5–5.1)

## 2023-01-23 LAB — CBC
HCT: 29.4 % — ABNORMAL LOW (ref 39.0–52.0)
Hemoglobin: 9.9 g/dL — ABNORMAL LOW (ref 13.0–17.0)
MCH: 33.4 pg (ref 26.0–34.0)
MCHC: 33.7 g/dL (ref 30.0–36.0)
MCV: 99.3 fL (ref 80.0–100.0)
Platelets: 72 10*3/uL — ABNORMAL LOW (ref 150–400)
RBC: 2.96 MIL/uL — ABNORMAL LOW (ref 4.22–5.81)
RDW: 14.6 % (ref 11.5–15.5)
WBC: 4.9 10*3/uL (ref 4.0–10.5)
nRBC: 0 % (ref 0.0–0.2)

## 2023-01-23 LAB — MAGNESIUM: Magnesium: 1.1 mg/dL — ABNORMAL LOW (ref 1.7–2.4)

## 2023-01-23 SURGERY — CYSTOSCOPY, WITH RETROGRADE PYELOGRAM AND URETERAL STENT INSERTION
Anesthesia: General | Site: Ureter | Laterality: Bilateral

## 2023-01-23 MED ORDER — PHENYLEPHRINE 80 MCG/ML (10ML) SYRINGE FOR IV PUSH (FOR BLOOD PRESSURE SUPPORT)
PREFILLED_SYRINGE | INTRAVENOUS | Status: AC
  Filled 2023-01-23: qty 10

## 2023-01-23 MED ORDER — VANCOMYCIN HCL IN DEXTROSE 1-5 GM/200ML-% IV SOLN: 1000.0000 mg | INTRAVENOUS | Status: DC

## 2023-01-23 MED ORDER — ONDANSETRON HCL 4 MG/2ML IJ SOLN
INTRAMUSCULAR | Status: AC
  Filled 2023-01-23: qty 2

## 2023-01-23 MED ORDER — ONDANSETRON HCL 4 MG/2ML IJ SOLN
INTRAMUSCULAR | Status: DC | PRN
  Administered 2023-01-23: 4 mg via INTRAVENOUS

## 2023-01-23 MED ORDER — PHENYLEPHRINE HCL (PRESSORS) 10 MG/ML IV SOLN
INTRAVENOUS | Status: DC | PRN
  Administered 2023-01-23: 80 ug via INTRAVENOUS

## 2023-01-23 MED ORDER — LIDOCAINE 2% (20 MG/ML) 5 ML SYRINGE
INTRAMUSCULAR | Status: AC
  Filled 2023-01-23: qty 5

## 2023-01-23 MED ORDER — PIPERACILLIN-TAZOBACTAM 3.375 G IVPB
3.3750 g | Freq: Three times a day (TID) | INTRAVENOUS | Status: DC
  Administered 2023-01-23 (×2): 3.375 g via INTRAVENOUS
  Filled 2023-01-23 (×2): qty 50

## 2023-01-23 MED ORDER — ACETAMINOPHEN 650 MG RE SUPP
650.0000 mg | Freq: Four times a day (QID) | RECTAL | Status: DC | PRN
  Administered 2023-01-23 (×2): 650 mg via RECTAL
  Filled 2023-01-23 (×2): qty 1

## 2023-01-23 MED ORDER — MIDAZOLAM HCL 2 MG/2ML IJ SOLN
INTRAMUSCULAR | Status: AC
  Filled 2023-01-23: qty 2

## 2023-01-23 MED ORDER — ORAL CARE MOUTH RINSE: 15.0000 mL | Freq: Once | OROMUCOSAL | Status: DC

## 2023-01-23 MED ORDER — PHENYLEPHRINE HCL-NACL 20-0.9 MG/250ML-% IV SOLN
INTRAVENOUS | Status: DC | PRN
  Administered 2023-01-23: 30 ug/min via INTRAVENOUS

## 2023-01-23 MED ORDER — LIDOCAINE HCL (CARDIAC) PF 100 MG/5ML IV SOSY
PREFILLED_SYRINGE | INTRAVENOUS | Status: DC | PRN
  Administered 2023-01-23: 60 mg via INTRATRACHEAL

## 2023-01-23 MED ORDER — SUCCINYLCHOLINE CHLORIDE 200 MG/10ML IV SOSY
PREFILLED_SYRINGE | INTRAVENOUS | Status: DC | PRN
  Administered 2023-01-23: 100 mg via INTRAVENOUS

## 2023-01-23 MED ORDER — LACTATED RINGERS IV SOLN: INTRAVENOUS | Status: AC

## 2023-01-23 MED ORDER — VANCOMYCIN HCL IN DEXTROSE 1-5 GM/200ML-% IV SOLN
1000.0000 mg | Freq: Once | INTRAVENOUS | Status: AC
  Administered 2023-01-23: 1000 mg via INTRAVENOUS
  Filled 2023-01-23: qty 200

## 2023-01-23 MED ORDER — ACETAMINOPHEN 10 MG/ML IV SOLN
1000.0000 mg | Freq: Four times a day (QID) | INTRAVENOUS | Status: AC | PRN
  Administered 2023-01-23: 1000 mg via INTRAVENOUS
  Filled 2023-01-23: qty 100

## 2023-01-23 MED ORDER — ACETAMINOPHEN 325 MG PO TABS
650.0000 mg | ORAL_TABLET | Freq: Four times a day (QID) | ORAL | Status: DC | PRN
  Filled 2023-01-23: qty 2

## 2023-01-23 MED ORDER — CHLORHEXIDINE GLUCONATE 0.12 % MT SOLN: 15.0000 mL | Freq: Once | OROMUCOSAL | Status: DC

## 2023-01-23 MED ORDER — STERILE WATER FOR IRRIGATION IR SOLN
Status: DC | PRN
  Administered 2023-01-23: 3000 mL via INTRAVESICAL

## 2023-01-23 MED ORDER — PROPOFOL 10 MG/ML IV BOLUS
INTRAVENOUS | Status: DC | PRN
Start: 2023-01-23 — End: 2023-01-23
  Administered 2023-01-23: 30 mg via INTRAVENOUS
  Administered 2023-01-23: 90 mg via INTRAVENOUS

## 2023-01-23 MED ORDER — DEXAMETHASONE SODIUM PHOSPHATE 10 MG/ML IJ SOLN
INTRAMUSCULAR | Status: AC
  Filled 2023-01-23: qty 1

## 2023-01-23 MED ORDER — LACTATED RINGERS IV SOLN: INTRAVENOUS | Status: DC

## 2023-01-23 MED ORDER — PANTOPRAZOLE SODIUM 40 MG PO TBEC
40.0000 mg | DELAYED_RELEASE_TABLET | Freq: Every day | ORAL | Status: DC
Start: 2023-01-23 — End: 2023-02-06
  Administered 2023-01-23 – 2023-02-06 (×15): 40 mg via ORAL
  Filled 2023-01-23 (×15): qty 1

## 2023-01-23 MED ORDER — FENTANYL CITRATE (PF) 250 MCG/5ML IJ SOLN
INTRAMUSCULAR | Status: DC | PRN
  Administered 2023-01-23: 50 ug via INTRAVENOUS

## 2023-01-23 MED ORDER — STERILE WATER FOR IRRIGATION IR SOLN
Status: DC | PRN
Start: 2023-01-23 — End: 2023-01-23
  Administered 2023-01-23: 500 mL

## 2023-01-23 MED ORDER — IOHEXOL 300 MG/ML  SOLN
INTRAMUSCULAR | Status: DC | PRN
  Administered 2023-01-23: 100 mL via URETHRAL

## 2023-01-23 MED ORDER — HYDROCODONE-ACETAMINOPHEN 5-325 MG PO TABS
1.0000 | ORAL_TABLET | ORAL | Status: DC | PRN
  Administered 2023-01-23 – 2023-02-01 (×2): 1 via ORAL
  Filled 2023-01-23 (×2): qty 1
  Filled 2023-01-23: qty 2

## 2023-01-23 MED ORDER — POTASSIUM CHLORIDE 20 MEQ PO PACK
40.0000 meq | PACK | Freq: Every day | ORAL | Status: DC
  Administered 2023-01-23 – 2023-01-24 (×3): 40 meq via ORAL
  Filled 2023-01-23 (×3): qty 2

## 2023-01-23 MED ORDER — CHLORHEXIDINE GLUCONATE 0.12 % MT SOLN
OROMUCOSAL | Status: AC
  Filled 2023-01-23: qty 15

## 2023-01-23 MED ORDER — FENTANYL CITRATE (PF) 250 MCG/5ML IJ SOLN
INTRAMUSCULAR | Status: AC
  Filled 2023-01-23: qty 5

## 2023-01-23 MED ORDER — SODIUM CHLORIDE 0.9 % IR SOLN
Status: DC | PRN
  Administered 2023-01-23: 3000 mL via INTRAVESICAL

## 2023-01-23 MED ORDER — PIPERACILLIN-TAZOBACTAM 3.375 G IVPB 30 MIN
3.3750 g | Freq: Once | INTRAVENOUS | Status: AC
  Administered 2023-01-23: 3.375 g via INTRAVENOUS
  Filled 2023-01-23: qty 50

## 2023-01-23 MED ORDER — SUCCINYLCHOLINE CHLORIDE 200 MG/10ML IV SOSY
PREFILLED_SYRINGE | INTRAVENOUS | Status: AC
  Filled 2023-01-23: qty 10

## 2023-01-23 MED ORDER — PROPOFOL 10 MG/ML IV BOLUS
INTRAVENOUS | Status: AC
  Filled 2023-01-23: qty 20

## 2023-01-23 MED ORDER — DULOXETINE HCL 30 MG PO CPEP
30.0000 mg | ORAL_CAPSULE | Freq: Every day | ORAL | Status: DC
  Administered 2023-01-23 – 2023-02-06 (×15): 30 mg via ORAL
  Filled 2023-01-23 (×15): qty 1

## 2023-01-23 MED ORDER — DEXAMETHASONE SODIUM PHOSPHATE 10 MG/ML IJ SOLN
INTRAMUSCULAR | Status: DC | PRN
Start: 2023-01-23 — End: 2023-01-23
  Administered 2023-01-23: 10 mg via INTRAVENOUS

## 2023-01-23 SURGICAL SUPPLY — 25 items
BAG DRN RND TRDRP ANRFLXCHMBR (UROLOGICAL SUPPLIES) ×1
BAG URINE DRAIN 2000ML AR STRL (UROLOGICAL SUPPLIES) ×2 IMPLANT
BAG URO CATCHER STRL LF (MISCELLANEOUS) ×2 IMPLANT
CATH FOLEY 2WAY SLVR 5CC 16FR (CATHETERS) IMPLANT
CATH URET 5FR 28IN OPEN ENDED (CATHETERS) IMPLANT
CATH URETL OPEN END 6FR 70 (CATHETERS) IMPLANT
GLOVE BIO SURGEON STRL SZ7.5 (GLOVE) ×2 IMPLANT
GLOVE BIOGEL PI IND STRL 8 (GLOVE) IMPLANT
GOWN STRL REUS W/ TWL LRG LVL3 (GOWN DISPOSABLE) ×2 IMPLANT
GOWN STRL REUS W/ TWL XL LVL3 (GOWN DISPOSABLE) ×2 IMPLANT
GOWN STRL REUS W/TWL LRG LVL3 (GOWN DISPOSABLE) ×1
GOWN STRL REUS W/TWL XL LVL3 (GOWN DISPOSABLE) ×1
GUIDEWIRE ANG ZIPWIRE 038X150 (WIRE) IMPLANT
GUIDEWIRE STR DUAL SENSOR (WIRE) ×2 IMPLANT
KIT TURNOVER KIT B (KITS) ×2 IMPLANT
MANIFOLD NEPTUNE II (INSTRUMENTS) IMPLANT
NS IRRIG 1000ML POUR BTL (IV SOLUTION) ×2 IMPLANT
PACK CYSTO (CUSTOM PROCEDURE TRAY) ×2 IMPLANT
PAD ARMBOARD 7.5X6 YLW CONV (MISCELLANEOUS) ×4 IMPLANT
STENT URET 6FRX24 CONTOUR (STENTS) IMPLANT
STENT URET 6FRX26 CONTOUR (STENTS) IMPLANT
SYPHON OMNI JUG (MISCELLANEOUS) ×2 IMPLANT
TOWEL GREEN STERILE FF (TOWEL DISPOSABLE) ×2 IMPLANT
TUBE CONNECTING 12X1/4 (SUCTIONS) IMPLANT
UNDERPAD 30X36 HEAVY ABSORB (UNDERPADS AND DIAPERS) ×2 IMPLANT

## 2023-01-23 NOTE — Assessment & Plan Note (Addendum)
Lactic acidosis Lactic acid above 4 low suspicion for sepsis, likely related to alcohol use.  Urine ketones negative IV hydration and monitor

## 2023-01-23 NOTE — Assessment & Plan Note (Addendum)
Hyperbilirubinemia Hepatic steatosis on prior CT Patient has no abdominal pain, lipase with modest elevation to about 150 bilirubin 1.4 CT abd ordered

## 2023-01-23 NOTE — Anesthesia Preprocedure Evaluation (Addendum)
Anesthesia Evaluation  Patient identified by MRN, date of birth, ID band Patient confused    Reviewed: Allergy & Precautions, NPO status , Patient's Chart, lab work & pertinent test results, Unable to perform ROS - Chart review only  Airway Mallampati: III  TM Distance: >3 FB Neck ROM: Full    Dental  (+) Dental Advisory Given, Poor Dentition   Pulmonary Current Smoker and Patient abstained from smoking.    + decreased breath sounds      Cardiovascular hypertension, Normal cardiovascular exam Rhythm:Regular Rate:Normal     Neuro/Psych Seizures -,   acute on chronic subdural hematoma    GI/Hepatic PUD,GERD  Medicated,,(+)     substance abuse  alcohol use  Endo/Other  negative endocrine ROS    Renal/GU negative Renal ROSBilateral Ureteral Stones     Musculoskeletal negative musculoskeletal ROS (+)    Abdominal   Peds  Hematology  (+) Blood dyscrasia (Thrombocytopenia), anemia   Anesthesia Other Findings   Reproductive/Obstetrics                             Anesthesia Physical Anesthesia Plan  ASA: 3  Anesthesia Plan: General   Post-op Pain Management:    Induction: Intravenous  PONV Risk Score and Plan: 2 and Dexamethasone and Ondansetron  Airway Management Planned: Oral ETT  Additional Equipment:   Intra-op Plan:   Post-operative Plan: Extubation in OR  Informed Consent: I have reviewed the patients History and Physical, chart, labs and discussed the procedure including the risks, benefits and alternatives for the proposed anesthesia with the patient or authorized representative who has indicated his/her understanding and acceptance.     Consent reviewed with POA and History available from chart only  Plan Discussed with: CRNA  Anesthesia Plan Comments: (Phone consent with patient's son.)       Anesthesia Quick Evaluation

## 2023-01-23 NOTE — Assessment & Plan Note (Signed)
Acute alcohol withdrawal with history of alcohol withdrawal seizures Patient has been cutting back his alcohol use. CIWA withdrawal protocol

## 2023-01-23 NOTE — ED Notes (Addendum)
Pt wife at bedside.

## 2023-01-23 NOTE — Anesthesia Procedure Notes (Signed)
Procedure Name: Intubation Date/Time: 01/23/2023 7:36 PM  Performed by: Edmonia Caprio, CRNAPre-anesthesia Checklist: Patient identified, Emergency Drugs available, Suction available, Patient being monitored and Timeout performed Patient Re-evaluated:Patient Re-evaluated prior to induction Oxygen Delivery Method: Circle system utilized Preoxygenation: Pre-oxygenation with 100% oxygen Induction Type: IV induction and Rapid sequence Laryngoscope Size: Miller and 2 Grade View: Grade III Tube type: Oral Tube size: 7.5 mm Number of attempts: 2 Airway Equipment and Method: Stylet Placement Confirmation: ETT inserted through vocal cords under direct vision, positive ETCO2 and breath sounds checked- equal and bilateral Secured at: 23 cm Tube secured with: Tape Dental Injury: Teeth and Oropharynx as per pre-operative assessment  Difficulty Due To: Difficult Airway- due to anterior larynx

## 2023-01-23 NOTE — ED Notes (Signed)
Pt able to drink when directed but unable to swallow pills. Pt just held it in his mouth and would not swallow. Suppository tylenol given. MD aware of fever.

## 2023-01-23 NOTE — Assessment & Plan Note (Addendum)
Chronic macrocytic anemia Hospitalized for bleeding peptic ulcers and 01/2021 No acute bleeding suspected.  Hemoglobin 12.5 Monitor hemoglobin Protonix daily

## 2023-01-23 NOTE — Assessment & Plan Note (Addendum)
Patient with history of traumatic subdural hematoma in May 2024 CT showing improvement from prior SDH now with new SDH without mass effect Neurosurgery was consulted from the ED and they will see patient in the a.m. Will get a 6-hour follow-up to evaluate for progression (per EDP, neurosurgery called back to say SDH appeared chronic) Neurologic checks Neurology consulted to follow

## 2023-01-23 NOTE — Op Note (Signed)
Operative Note  Preoperative diagnosis:  1.  Bilateral ureteral stones with hydronephrosis  Postoperative diagnosis: 1.  same  Procedure(s): 1.  Cystoscopy 2. Bilateral  retrograde pyelogram with interpretation 3. bilateral ureteral stent placement (6x24 cm on the right, 6x26 cm on the left) 4. Fluoroscopy <1 hour with intraoperative interpretation  Surgeon: Irine Seal, MD  Assistants:  None  Anesthesia:  General  Complications:  None  EBL:  minimal  Specimens: 1. none  Drains/Catheters: 1.  Bilateral stens - 6Fr x 24cm ureteral on the right, 6x26 cm on the left  Intraoperative findings:   Cystoscopy demonstrated no suspicious lesions, masses, stones or other pathology. Bilateral Retrograde pyelogram demonstrated bilateral hydronephrosis, right moreso than left Successful bilateral ureteral stent placement with curl in the renal pelvis and bladder respectively. Would use 6x24 cm bilaterally in the future  Indication:  Richard Andrews is a 62 y.o. male with the above dx. We have discussed the potential benefits and risks of the procedure, side effects of the proposed treatment, the likelihood of the patient achieving the goals of the procedure, and any potential problems that might occur during the procedure or recuperation. Informed consent has been obtained with patients son as patient is altered.  Description of procedure: The patient was taken to the operating room and general anesthesia was induced.  The patient was placed in the dorsal lithotomy position, prepped and draped in the usual sterile fashion, and preoperative antibiotics were administered. A preoperative time-out was performed.   Cystourethroscopy was performed.  The patient's urethra was examined and was normal. There was some bilobar prostatic hypertrophy. The bladder was then systematically examined in its entirety. There was no evidence for any bladder tumors, stones, or other mucosal pathology.     Attention then turned to the left ureteral orifice. A 0.038 zip wire was passed through the left orifice and over the wire a 5 Fr open ended catheter was inserted and passed up to the level of the renal pelvis. Omnipaque contrast was injected through the ureteral catheter and a retrograde pyelogram was performed with findings as dictated above. The wire was then replaced and the open ended catheter was removed.   A 6Fr x 26cm ureteral stent was advance over the wire. The stent was positioned appropriately under fluoroscopic and cystoscopic guidance.  The wire was then removed with an adequate stent curl noted in the renal pelvis as well as in the bladder.  The procedure was repeated in an identical fashion on the right with a 6x24 cm  The bladder was then emptied and the procedure ended.  The patient appeared to tolerate the procedure well and without complications.  The patient was able to be awakened and transferred to the recovery unit in satisfactory condition.   Plan:   --return to floor --will arrange for staged ureteroscopy once over acute issues as outpatient  Richard Seat MD [facility]  Pager: [phone]

## 2023-01-23 NOTE — Assessment & Plan Note (Addendum)
Repletion started in the ED Continue to monitor and replete as needed
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
## 2023-01-23 NOTE — ED Notes (Signed)
Pt very uncomfortable when trying to move around bed. States he does have a headache. Pt bedding changed due to bowel movement. Pt in pain while turning and being wiped. Extreme redness to scrotum and buttocks.  Area cleaned and dried.

## 2023-01-23 NOTE — Assessment & Plan Note (Signed)
PT and TOC consult

## 2023-01-23 NOTE — ED Notes (Signed)
Pt able to swallow pills but it takes several tries.

## 2023-01-23 NOTE — Progress Notes (Signed)
Verbal consent received by patient's son, [facility], for the renal stent placement. Unable to reach pt's wife or daughter by phone. Pt incompetent to sign consent at this time d/t confusion/delirium.

## 2023-01-23 NOTE — ED Notes (Signed)
ED TO INPATIENT HANDOFF REPORT  ED Nurse Name and Phone #: Dot Lanes, paramedic   S Name/Age/Gender Richard Andrews 62 y.o. male Room/Bed: 002C/002C  Code Status   Code Status: Full Code  Home/SNF/Other Home Patient oriented to: self, place, and situation Is this baseline? No   Triage Complete: Triage complete  Chief Complaint SDH (subdural hematoma) (HCC) [S06.5XAA]  Triage Note Pt comes in via EMS from home.  Last seen about 4 days ago by son. Found today in his own waste, unknown down time.  Pt unable to get up on his own.  Pt is a daily drinker and also has not had any alcohol in a few days.  Pt answers questions appropriately.  VSS.  20G LAC   Allergies No Known Allergies  Level of Care/Admitting Diagnosis ED Disposition     ED Disposition  Admit   Condition  --   Comment  Hospital Area: [facility] [[id number]]  Level of Care: Progressive [102]  Admit to Progressive based on following criteria: NEUROLOGICAL AND NEUROSURGICAL complex patients with significant risk of instability, who do not meet ICU criteria, yet require close observation or frequent assessment (< / = every 2 - 4 hours) with medical / nursing intervention.  May admit patient to [name] or [facility] if equivalent level of care is available:: No  Covid Evaluation: Asymptomatic - no recent exposure (last 10 days) testing not required  Diagnosis: SDH (subdural hematoma) [facility]) [[id number]]  Admitting Physician: Andris Baumann [[id number]]  Attending Physician: Andris Baumann [[id number]]  Certification:: I certify this patient will need inpatient services for at least 2 midnights  Estimated Length of Stay: 3          B Medical/Surgery History Past Medical History:  Diagnosis Date   Allergy    Distal radius fracture, left    Hypertension    Past Surgical History:  Procedure Laterality Date   BIOPSY  01/22/2021   Procedure: BIOPSY;  Surgeon: Shellia Cleverly, DO;  [facility]: [facility]  ENDOSCOPY;  Service: Gastroenterology;;   ESOPHAGOGASTRODUODENOSCOPY (EGD) WITH PROPOFOL N/A 01/22/2021   Procedure: ESOPHAGOGASTRODUODENOSCOPY (EGD) WITH PROPOFOL;  Surgeon: Shellia Cleverly, DO;  Location: [facility] ENDOSCOPY;  Service: Gastroenterology;  Laterality: N/A;   I & D EXTREMITY Right 01/10/2020   Procedure: ORIF RIGHT WRIST;  Surgeon: Dominica Severin, MD;  Location: [facility] OR;  Service: Orthopedics;  Laterality: Right;   IR ANGIO EXTERNAL CAROTID SEL EXT CAROTID UNI R MOD SED  10/04/2022   IR ANGIO INTRA EXTRACRAN SEL INTERNAL CAROTID UNI R MOD SED  09/30/2022   IR ANGIOGRAM FOLLOW UP STUDY  09/30/2022   IR NEURO EACH ADD'L AFTER BASIC UNI RIGHT (MS)  10/04/2022   IR TRANSCATH/EMBOLIZ  09/30/2022   OPEN REDUCTION INTERNAL FIXATION (ORIF) DISTAL RADIAL FRACTURE Left 01/28/2015   Procedure: OPEN REDUCTION INTERNAL FIXATION (ORIF) LEFT DISTAL RADIAL FRACTURE;  Surgeon: Tarry Kos, MD;  Location: [facility];  Service: Orthopedics;  Laterality: Left;   ORIF HUMERUS FRACTURE Right 09/27/2022   Procedure: OPEN REDUCTION INTERNAL FIXATION (ORIF) PROXIMAL HUMERUS FRACTURE;  Surgeon: Teryl Lucy, MD;  Location: [facility] OR;  Service: Orthopedics;  Laterality: Right;   RADIOLOGY WITH ANESTHESIA N/A 09/30/2022   Procedure: IR WITH ANESTHESIA MMA EMBOLIZATION;  Surgeon: Radiologist, Medication, MD;  Location: [facility] OR;  Service: Radiology;  Laterality: N/A;   TONSILLECTOMY       A IV Location/Drains/Wounds Patient Lines/Drains/Airways Status     Active Line/Drains/Airways  Name Placement date Placement time Site Days   Peripheral IV 01/22/23 20 G 1.16" Anterior;Distal;Left;Upper Arm 01/22/23  1527  Arm  1   Peripheral IV 01/22/23 20 G 1.88" Anterior;Right Forearm 01/22/23  2029  Forearm  1   External Urinary Catheter 01/22/23  1721  --  1   Wound / Incision (Open or Dehisced) 11/15/22 Other (Comment) Arm Left;Lower;Posterior Abrasion 11/15/22  1900  Arm  69            Intake/Output  Last 24 hours  Intake/Output Summary (Last 24 hours) at 01/23/2023 1343 Last data filed at 01/22/2023 2212 Gross per 24 hour  Intake --  Output 100 ml  Net -100 ml    Labs/Imaging Results for orders placed or performed during the hospital encounter of 01/22/23 (from the past 48 hour(s))  CBC with Differential     Status: Abnormal   Collection Time: 01/22/23  3:23 PM  Result Value Ref Range   WBC 8.5 4.0 - 10.5 K/uL   RBC 3.61 (L) 4.22 - 5.81 MIL/uL   Hemoglobin 12.5 (L) 13.0 - 17.0 g/dL   HCT 56.3 (L) 87.5 - 64.3 %   MCV 100.8 (H) 80.0 - 100.0 fL   MCH 34.6 (H) 26.0 - 34.0 pg   MCHC 34.3 30.0 - 36.0 g/dL   RDW 32.9 51.8 - 84.1 %   Platelets PLATELET CLUMPS NOTED ON SMEAR, UNABLE TO ESTIMATE 150 - 400 K/uL   nRBC 0.0 0.0 - 0.2 %   Neutrophils Relative % 77 %   Neutro Abs 6.5 1.7 - 7.7 K/uL   Lymphocytes Relative 5 %   Lymphs Abs 0.4 (L) 0.7 - 4.0 K/uL   Monocytes Relative 17 %   Monocytes Absolute 1.4 (H) 0.1 - 1.0 K/uL   Eosinophils Relative 0 %   Eosinophils Absolute 0.0 0.0 - 0.5 K/uL   Basophils Relative 0 %   Basophils Absolute 0.0 0.0 - 0.1 K/uL   Immature Granulocytes 1 %   Abs Immature Granulocytes 0.05 0.00 - 0.07 K/uL    Comment: Performed at [facility] Lab, [address] [place], [name] [address]  I-Stat Lactic Acid, ED     Status: Abnormal   Collection Time: 01/22/23  3:44 PM  Result Value Ref Range   Lactic Acid, Venous 2.8 (HH) 0.5 - 1.9 mmol/L   Comment NOTIFIED PHYSICIAN   Ethanol     Status: None   Collection Time: 01/22/23  4:32 PM  Result Value Ref Range   Alcohol, Ethyl (B) <10 <10 mg/dL    Comment: (NOTE) Lowest detectable limit for serum alcohol is 10 mg/dL.  For medical purposes only. Performed at [facility] Lab, [address] [place], [name] [address]   Lipase, blood     Status: Abnormal   Collection Time: 01/22/23  4:32 PM  Result Value Ref Range   Lipase 156 (H) 11 - 51 U/L    Comment: Performed at [facility] Lab,  [address] [place], [name] [address]  Magnesium     Status: Abnormal   Collection Time: 01/22/23  4:32 PM  Result Value Ref Range   Magnesium 1.3 (L) 1.7 - 2.4 mg/dL    Comment: Performed at [facility] Lab, [address] [place], [name] [address]  Protime-INR     Status: None   Collection Time: 01/22/23  4:32 PM  Result Value Ref Range   Prothrombin Time 15.1 11.4 - 15.2 seconds   INR 1.2  0.8 - 1.2    Comment: (NOTE) INR goal varies based on device and disease states. Performed at [facility] Lab, [address], [place], [name] [address]   Rapid urine drug screen (hospital performed)     Status: None   Collection Time: 01/22/23  4:33 PM  Result Value Ref Range   Opiates NONE DETECTED NONE DETECTED   Cocaine NONE DETECTED NONE DETECTED   Benzodiazepines NONE DETECTED NONE DETECTED   Amphetamines NONE DETECTED NONE DETECTED   Tetrahydrocannabinol NONE DETECTED NONE DETECTED   Barbiturates NONE DETECTED NONE DETECTED    Comment: (NOTE) DRUG SCREEN FOR MEDICAL PURPOSES ONLY.  IF CONFIRMATION IS NEEDED FOR ANY PURPOSE, NOTIFY LAB WITHIN 5 DAYS.  LOWEST DETECTABLE LIMITS FOR URINE DRUG SCREEN Drug Class                     Cutoff (ng/mL) Amphetamine and metabolites    1000 Barbiturate and metabolites    200 Benzodiazepine                 200 Opiates and metabolites        300 Cocaine and metabolites        300 THC                            50 Performed at [facility] Lab, [address] [place], [name] [address]   Urinalysis, Routine w reflex microscopic -Urine, Clean Catch     Status: Abnormal   Collection Time: 01/22/23  4:33 PM  Result Value Ref Range   Color, Urine AMBER (A) YELLOW    Comment: BIOCHEMICALS MAY BE AFFECTED BY COLOR   APPearance HAZY (A) CLEAR   Specific Gravity, Urine 1.021 1.005 - 1.030   pH 5.0 5.0 - 8.0   Glucose, UA 50 (A) NEGATIVE mg/dL   Hgb urine dipstick MODERATE (A) NEGATIVE   Bilirubin Urine NEGATIVE NEGATIVE   Ketones,  ur NEGATIVE NEGATIVE mg/dL   Protein, ur 578 (A) NEGATIVE mg/dL   Nitrite NEGATIVE NEGATIVE   Leukocytes,Ua TRACE (A) NEGATIVE   RBC / HPF 0-5 0 - 5 RBC/hpf   WBC, UA 11-20 0 - 5 WBC/hpf   Bacteria, UA RARE (A) NONE SEEN   Squamous Epithelial / HPF 0-5 0 - 5 /HPF   Trans Epithel, UA <1    Mucus PRESENT    Hyaline Casts, UA PRESENT     Comment: Performed at [facility] Lab, [address] [place], [name] [address]  Ammonia     Status: None   Collection Time: 01/22/23  4:33 PM  Result Value Ref Range   Ammonia 35 9 - 35 umol/L    Comment: Performed at [facility] Lab, [address], [place], [name] [address]  Comprehensive metabolic panel     Status: Abnormal   Collection Time: 01/22/23  5:43 PM  Result Value Ref Range   Sodium 130 (L) 135 - 145 mmol/L   Potassium 2.5 (LL) 3.5 - 5.1 mmol/L    Comment: CRITICAL RESULT CALLED TO, READ BACK BY AND VERIFIED WITH E,SCHAFFER RN @1914  01/22/23 E,BENTON   Chloride 92 (L) 98 - 111 mmol/L   CO2 21 (L) 22 - 32 mmol/L   Glucose, Bld 253 (H) 70 - 99 mg/dL    Comment: Glucose reference range applies only to samples taken after fasting for at least 8 hours.   BUN 29 (H)  8 - 23 mg/dL   Creatinine, Ser 0.45 0.61 - 1.24 mg/dL   Calcium 9.7 8.9 - 40.9 mg/dL   Total Protein 7.6 6.5 - 8.1 g/dL   Albumin 3.4 (L) 3.5 - 5.0 g/dL   AST 66 (H) 15 - 41 U/L   ALT 24 0 - 44 U/L   Alkaline Phosphatase 80 38 - 126 U/L   Total Bilirubin 1.5 (H) 0.3 - 1.2 mg/dL   GFR, Estimated >81 >19 mL/min    Comment: (NOTE) Calculated using the CKD-EPI Creatinine Equation (2021)    Anion gap 17 (H) 5 - 15    Comment: Performed at [facility], [address], [place], [name] [address]  I-Stat Lactic Acid, ED     Status: Abnormal   Collection Time: 01/22/23  6:03 PM  Result Value Ref Range   Lactic Acid, Venous 4.2 (HH) 0.5 - 1.9 mmol/L   Comment NOTIFIED PHYSICIAN   I-Stat venous blood gas, ([facility] ED, [facility], [facility])     Status: Abnormal   Collection Time:  01/22/23  6:47 PM  Result Value Ref Range   pH, Ven 7.468 (H) 7.25 - 7.43   pCO2, Ven 33.9 (L) 44 - 60 mmHg   pO2, Ven 80 (H) 32 - 45 mmHg   Bicarbonate 24.6 20.0 - 28.0 mmol/L   TCO2 26 22 - 32 mmol/L   O2 Saturation 97 %   Acid-Base Excess 1.0 0.0 - 2.0 mmol/L   Sodium 133 (L) 135 - 145 mmol/L   Potassium 2.7 (LL) 3.5 - 5.1 mmol/L   Calcium, Ion 1.18 1.15 - 1.40 mmol/L   HCT 35.0 (L) 39.0 - 52.0 %   Hemoglobin 11.9 (L) 13.0 - 17.0 g/dL   Sample type VENOUS    Comment NOTIFIED PHYSICIAN   CK     Status: None   Collection Time: 01/23/23  1:04 AM  Result Value Ref Range   Total CK 385 49 - 397 U/L    Comment: Performed at [facility], [address], [place], [name] [address]  Potassium     Status: None   Collection Time: 01/23/23  1:04 AM  Result Value Ref Range   Potassium 3.5 3.5 - 5.1 mmol/L    Comment: Performed at [facility], [address], [place], [name] [address]  Lactic acid, plasma     Status: None   Collection Time: 01/23/23  1:04 AM  Result Value Ref Range   Lactic Acid, Venous 1.6 0.5 - 1.9 mmol/L    Comment: Performed at [facility], [address], [place], [name] [address]  Potassium     Status: Abnormal   Collection Time: 01/23/23  3:33 AM  Result Value Ref Range   Potassium 3.3 (L) 3.5 - 5.1 mmol/L    Comment: Performed at [facility], [address], [place], [name] [address]  CBC     Status: Abnormal   Collection Time: 01/23/23  3:33 AM  Result Value Ref Range   WBC 4.9 4.0 - 10.5 K/uL   RBC 2.96 (L) 4.22 - 5.81 MIL/uL   Hemoglobin 9.9 (L) 13.0 - 17.0 g/dL   HCT 84.1 (L) 32.4 - 40.1 %   MCV 99.3 80.0 - 100.0 fL   MCH 33.4 26.0 - 34.0 pg   MCHC 33.7 30.0 - 36.0 g/dL   RDW 02.7 25.3 - 66.4 %   Platelets 72 (L) 150 - 400 K/uL    Comment: SPECIMEN  CHECKED FOR CLOTS Immature Platelet Fraction may be clinically indicated, consider ordering this additional test [id number] REPEATED TO VERIFY PLATELET COUNT CONFIRMED BY  SMEAR    nRBC 0.0 0.0 - 0.2 %    Comment: Performed at [facility] Lab, [address] [place], [name] [address]  Lactic acid, plasma     Status: None   Collection Time: 01/23/23  3:33 AM  Result Value Ref Range   Lactic Acid, Venous 1.4 0.5 - 1.9 mmol/L    Comment: Performed at [facility] Lab, [address], [place], [name] [address]  Magnesium     Status: Abnormal   Collection Time: 01/23/23  9:22 AM  Result Value Ref Range   Magnesium 1.1 (L) 1.7 - 2.4 mg/dL    Comment: Performed at [facility], [address] [place], [name] [address]   US RENAL  Result Date: 01/23/2023 CLINICAL DATA:  Fever EXAM: RENAL / URINARY TRACT ULTRASOUND COMPLETE COMPARISON:  CT abdomen 01/23/2023 FINDINGS: Right Kidney: Renal measurements: 12.5 by 6.5 by 5.7 cm = volume: 243 mL. Echogenicity within normal limits. Mild to moderate hydronephrosis, with suspected stone at the right UPJ shown on image 125 of series 1 -3. No mass identified. Left Kidney: Renal measurements: 10.7 by 6.4 by 5.4 cm = volume: 192 mL. Echogenicity within normal limits. No mass or hydronephrosis visualized. On image 45 of series 1 -1, the previously seen left renal pelvis stone is suspected of being present. Bladder: Appears normal for degree of bladder distention, although the ureteral jets are not visualized. Other: None. IMPRESSION: 1. Mild to moderate right hydronephrosis. The right UPJ stone shown on the CT scan from earlier this morning is again observed. 2. Left renal pelvis stone is again observed without substantial left hydronephrosis. Electronically Signed   By: Gaylyn Rong M.D.   On: 01/23/2023 11:52   CT Head Wo Contrast  Result Date: 01/23/2023 CLINICAL DATA:  Subdural hemorrhage follow up sdh EXAM: CT HEAD WITHOUT CONTRAST TECHNIQUE: Contiguous axial images were obtained from the base of the skull through the vertex without intravenous contrast. RADIATION DOSE REDUCTION: This exam was performed  according to the departmental dose-optimization program which includes automated exposure control, adjustment of the mA and/or kV according to patient size and/or use of iterative reconstruction technique. COMPARISON:  CT head 01/22/23 FINDINGS: Brain: Cerebral ventricle sizes are concordant with the degree of cerebral volume loss. No evidence of large-territorial acute infarction. No parenchymal hemorrhage. No mass lesion. Left anterior temporal encephalomalacia. Slightly increased in size acute on chronic 11 mm (from 9 mm) right subdural hematoma. No new extra-axial fluid collection. No midline shift. Persistent mass effect on the right frontoparietal lobes and sulci. No hydrocephalus. Basilar cisterns are patent. Vascular: No hyperdense vessel. Skull: No acute fracture or focal lesion. Old left zygomatic arch fracture. Sinuses/Orbits: Left maxillary sinus mucosal thickening. Otherwise paranasal sinuses and mastoid air cells are clear. The orbits are unremarkable. Other: None. IMPRESSION: Slightly increased in size acute on chronic 11 mm (from 9 mm) right subdural hematoma. No associated midline shift. Electronically Signed   By: Tish Frederickson M.D.   On: 01/23/2023 02:05   CT ABDOMEN PELVIS WO CONTRAST  Result Date: 01/23/2023 CLINICAL DATA:  Pancreatitis. EXAM: CT ABDOMEN AND PELVIS WITHOUT CONTRAST TECHNIQUE: Multidetector CT imaging of the abdomen and pelvis was performed following the standard protocol without IV contrast. RADIATION DOSE REDUCTION: This exam was performed according to the departmental dose-optimization program which includes automated exposure control,  adjustment of the mA and/or kV according to patient size and/or use of iterative reconstruction technique. COMPARISON:  CTs with IV contrast 11/15/2022 and 01/19/2021 FINDINGS: Lower chest: Lung bases are clear of infiltrates. There is a calcified granuloma in the right lower lobe. Mild posterior atelectatic changes. The cardiac size is  normal. There are three-vessel coronary artery calcifications. Hepatobiliary: Moderately steatotic liver again noted with slight nodular changes along the left buttock surface consistent with at least early cirrhosis. Dense sludge in the gallbladder but no calcified stones. Pancreas: Hazy opacity in the peripancreatic interstitial tremors noted alongside the body and tail sections consistent with acute interstitial pancreatitis with interval mild swelling of this portion. Rest of the pancreas unremarkable without contrast. There is no ductal dilatation. Spleen: Unremarkable without contrast.  Small splenule anteriorly. Adrenals/Urinary Tract: There is no adrenal mass. No contour deforming abnormality of the kidneys. There is bilateral renal swelling and perinephric edema. There is mild left and moderate right hydronephrosis. There are bilateral UPJ stones, on the right measuring 9 x 5 x 4 mm. On the left the stone is 7 x 6 x 3 mm. Both stones were previously in the inferior pole collecting systems. Both of the ureters are otherwise clear. Bladder is contracted but could be thickened. It was not previously thickened. Stomach/Bowel: No dilatation or wall thickening including the appendix. There is fluid throughout the colon. Uncomplicated sigmoid diverticulosis. Vascular/Lymphatic: Slightly prominent hepatic portal vein measuring 14.6 mm. Aortic atherosclerosis. 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 No enlarged abdominal or pelvic lymph nodes. Circumaortic left renal vein is again shown. Reproductive: Enlarged prostate, transverse axis 4.7 cm. Both testicles are in the scrotal sac. Other: There is minimal fluid in both perinephric spaces, minimal fluid extending along both pericolic gutters. No pelvic fluid is seen. There is no free air or free hemorrhage. Haziness in the mesenteric root fat is chronically noted. Musculoskeletal: Chronic bow tie shaped L1 compression fracture with bridging  osteophytes to T12. Interval partial healing noted left acetabular posterior column fracture. No new fracture has become apparent. No acute or other significant osseous findings. Bridging osteophytes again both anterior SI joints. IMPRESSION: 1. Acute interstitial pancreatitis involving the body and tail sections. 2. Mild left and moderate right hydronephrosis with bilateral UPJ stones, on the right 9 x 5 x 4 mm, on the left 7 x 6 x 3 mm. 3. Bilateral renal swelling and perinephric edema. Correlate clinically for infectious complication. 4. Minimal fluid in both perinephric spaces, extending along both pericolic gutters. 5. Hepatic steatosis with slight nodular changes along the left hepatic surface consistent with early cirrhosis. 6. Slightly prominent hepatic portal vein and paraesophageal varices consistent with portal hypertension. 7. Fluid in the colon without evidence of bowel obstruction or inflammation. 8. Prostatomegaly with possible bladder thickening versus nondistention. 9. Aortic and coronary artery atherosclerosis. 10. Interval partial healing of left acetabular posterior column fracture. 11. Chronic L1 compression fracture. Aortic Atherosclerosis (ICD10-I70.0). Electronically Signed   By: Almira Bar M.D.   On: 01/23/2023 01:36   CT Cervical Spine Wo Contrast  Result Date: 01/22/2023 CLINICAL DATA:  Altered mental status.  Subdural hematoma. EXAM: CT CERVICAL SPINE WITHOUT CONTRAST TECHNIQUE: Multidetector CT imaging of the cervical spine was performed without intravenous contrast. Multiplanar CT image reconstructions were also generated. RADIATION DOSE REDUCTION: This exam was performed according to the departmental dose-optimization program which includes automated exposure control, adjustment of the mA and/or kV according to patient size and/or use of iterative reconstruction  technique. COMPARISON:  CT of the cervical spine 11/15/2022 FINDINGS: Alignment: Slight degenerative anterolisthesis  again noted at C3-4. Straightening of the normal cervical lordosis is stable. Skull base and vertebrae: The craniocervical junction is normal. Vertebral body heights are normal. No acute or healing fractures are present. Soft tissues and spinal canal: No prevertebral fluid or swelling. No visible canal hematoma. Disc levels: Multilevel degenerative changes of the cervical spine are similar to the prior exam. Foraminal narrowing is greatest on the right at C3-4, on the left at C4-5 and C5-6 and on the right at C6-7. Upper chest: The lung apices are clear. Stable pleural calcifications are noted at both lung apices. IMPRESSION: 1. No acute or healing fractures. 2. Multilevel degenerative changes of the cervical spine are similar to the prior exam. Electronically Signed   By: Marin Roberts M.D.   On: 01/22/2023 16:49   CT Head Wo Contrast  Result Date: 01/22/2023 CLINICAL DATA:  Mental status. EXAM: CT HEAD WITHOUT CONTRAST TECHNIQUE: Contiguous axial images were obtained from the base of the skull through the vertex without intravenous contrast. RADIATION DOSE REDUCTION: This exam was performed according to the departmental dose-optimization program which includes automated exposure control, adjustment of the mA and/or kV according to patient size and/or use of iterative reconstruction technique. COMPARISON:  CT head 11/15/2022 FINDINGS: Brain: The previously seen subdural hematoma over the right frontal and parietal convexity is slightly decreased in size. Layering hyperdensity likely represents septi. A new more anterior extra-axial collection is present, consistent with a new subdural hematoma with layering blood products measuring approximately 9 mm. The new hemorrhage creates some local mass effect with partial effacement sulci. However the previously seen midline shift has resolved. The ventricles are of normal size. No acute cortical infarct is present. The deep brain nuclei are within normal limits.  The brainstem and cerebellum are within normal limits. Midline structures are within normal limits. Vascular: No hyperdense vessel or unexpected calcification. Skull: Calvarium is intact. No focal lytic or blastic lesions are present. No significant extracranial soft tissue lesion is present. Sinuses/Orbits: The paranasal sinuses and mastoid air cells are clear. The globes and orbits are within normal limits. IMPRESSION: 1. New more anterior extra-axial collection, consistent with a new subdural hematoma with layering blood products measuring approximately 9 mm. 2. The new hemorrhage creates some local mass effect with partial effacement sulci. However the previously seen midline shift has resolved. 3. The previously seen subdural hematoma over the right frontal and parietal convexity has decreased in size. No residual midline shift is present. 4. No acute cortical infarct. These results were called by telephone at the time of interpretation on 01/22/2023 at 4:40 pm to provider Dr. Edwin Dada , who verbally acknowledged these results. Electronically Signed   By: Marin Roberts M.D.   On: 01/22/2023 16:40    Pending Labs Unresulted Labs (From admission, onward)     Start     Ordered   01/24/23 0500  Magnesium  Tomorrow morning,   R        01/23/23 0751   01/24/23 0500  Comprehensive metabolic panel  Tomorrow morning,   R        01/23/23 0751   01/24/23 0500  CBC  Tomorrow morning,   R        01/23/23 1026   01/23/23 0805  Urine Culture (for pregnant, neutropenic or urologic patients or patients with an indwelling urinary catheter)  (Urine Labs)  Once,   R  Question:  Indication  Answer:  Sepsis   01/23/23 0804   01/23/23 0804  Culture, blood (Routine X 2) w Reflex to ID Panel  BLOOD CULTURE X 2,   R (with TIMED occurrences)      01/23/23 0804   01/22/23 1754  Blood gas, venous (at [facility] and [facility])  Once,   R        01/22/23 1753            Vitals/Pain Today's Vitals   01/23/23 1100  01/23/23 1123 01/23/23 1249 01/23/23 1259  BP: 107/79  112/80   Pulse: 95  81   Resp: (!) 22     Temp:    99.3 F (37.4 C)  TempSrc:    Oral  SpO2: 97%     Weight:      Height:      PainSc:  0-No pain      Isolation Precautions No active isolations  Medications Medications  magnesium oxide (MAG-OX) tablet 400 mg (400 mg Oral Given 01/23/23 1024)  0.9 %  sodium chloride infusion (0 mLs Intravenous Stopped 01/23/23 0104)  LORazepam (ATIVAN) injection 0-4 mg ( Intravenous See Alternative 01/23/23 1254)    Or  LORazepam (ATIVAN) tablet 0-4 mg (1 mg Oral Given 01/23/23 1254)  LORazepam (ATIVAN) injection 0-4 mg (has no administration in time range)    Or  LORazepam (ATIVAN) tablet 0-4 mg (has no administration in time range)  thiamine (VITAMIN B1) tablet 100 mg (100 mg Oral Given 01/23/23 1015)    Or  thiamine (VITAMIN B1) injection 100 mg ( Intravenous See Alternative 01/23/23 1015)  potassium chloride (KLOR-CON) packet 40 mEq (40 mEq Oral Given 01/23/23 1024)  DULoxetine (CYMBALTA) DR capsule 30 mg (30 mg Oral Given 01/23/23 1116)  pantoprazole (PROTONIX) EC tablet 40 mg (40 mg Oral Given 01/23/23 1022)  acetaminophen (TYLENOL) tablet 650 mg ( Oral See Alternative 01/23/23 0826)    Or  acetaminophen (TYLENOL) suppository 650 mg (650 mg Rectal Given 01/23/23 0826)  lactated ringers infusion (0 mLs Intravenous Stopped 01/23/23 0954)  HYDROcodone-acetaminophen (NORCO/VICODIN) 5-325 MG per tablet 1-2 tablet (1 tablet Oral Given 01/23/23 1254)  vancomycin (VANCOCIN) IVPB 1000 mg/200 mL premix (has no administration in time range)  piperacillin-tazobactam (ZOSYN) IVPB 3.375 g (has no administration in time range)  acetaminophen (TYLENOL) tablet 325 mg (325 mg Oral Given 01/22/23 1638)  sodium chloride 0.9 % bolus 500 mL (0 mLs Intravenous Stopped 01/22/23 2112)  sodium chloride 0.9 % bolus 500 mL (0 mLs Intravenous Stopped 01/22/23 2112)  potassium chloride 10 mEq in 100 mL IVPB (0 mEq Intravenous  Stopped 01/22/23 2250)  piperacillin-tazobactam (ZOSYN) IVPB 3.375 g (0 g Intravenous Stopped 01/23/23 0940)  vancomycin (VANCOCIN) IVPB 1000 mg/200 mL premix (0 mg Intravenous Stopped 01/23/23 1046)    Mobility walks     Focused Assessments    R Recommendations: See Admitting Provider Note  Report given to:   Additional Notes:

## 2023-01-23 NOTE — Progress Notes (Signed)
   01/23/23 1540  Assess: MEWS Score  Temp (!) 102.6 F (39.2 C)  BP (!) 145/98  MAP (mmHg) 112  Pulse Rate 95  ECG Heart Rate 91  Resp 20  Level of Consciousness Alert  SpO2 92 %  O2 Device Room Air  Assess: MEWS Score  MEWS Temp 2  MEWS Systolic 0  MEWS Pulse 0  MEWS RR 0  MEWS LOC 0  MEWS Score 2  MEWS Score Color Yellow  Assess: if the MEWS score is Yellow or Red  Were vital signs accurate and taken at a resting state? Yes  Does the patient meet 2 or more of the SIRS criteria? No  MEWS guidelines implemented  Yes, yellow  Treat  MEWS Interventions Considered administering scheduled or prn medications/treatments as ordered  Take Vital Signs  Increase Vital Sign Frequency  Yellow: Q2hr x1, continue Q4hrs until patient remains green for 12hrs  Escalate  MEWS: Escalate Yellow: Discuss with charge nurse and consider notifying provider and/or RRT  Notify: Charge Nurse/RN  Name of Charge Nurse/RN Notified Delicia RN  Provider Notification  Provider Name/Title Dr. Renford Dills  Date Provider Notified 01/23/23  Time Provider Notified 1615  Method of Notification Page  Notification Reason Change in status  Provider response See new orders  Date of Provider Response 01/23/23  Time of Provider Response 1620  Assess: SIRS CRITERIA  SIRS Temperature  1  SIRS Pulse 1  SIRS Respirations  0  SIRS WBC 0  SIRS Score Sum  2

## 2023-01-23 NOTE — Transfer of Care (Signed)
Immediate Anesthesia Transfer of Care Note  Patient: Richard Andrews  Procedure(s) Performed: CYSTOSCOPY WITH RETROGRADE PYELOGRAM/URETERAL STENT PLACEMENT (Bilateral)  Patient Location: PACU  Anesthesia Type:General  Level of Consciousness: sedated and responds to stimulation  Airway & Oxygen Therapy: Patient Spontanous Breathing and Patient connected to nasal cannula oxygen  Post-op Assessment: Report given to RN and Post -op Vital signs reviewed and stable  Post vital signs: Reviewed and stable  Last Vitals:  Vitals Value Taken Time  BP 117/76 01/23/23 2045  Temp    Pulse 64 01/23/23 2047  Resp 14 01/23/23 2047  SpO2 100 % 01/23/23 2047  Vitals shown include unfiled device data.  Last Pain:  Vitals:   01/23/23 1911  TempSrc: Oral  PainSc:          Complications: No notable events documented.

## 2023-01-23 NOTE — Consult Note (Signed)
Urology Consult   Reason for consult: Bilateral obstructing ureteral stones  History of Present Illness: Richard Andrews is a 62 y.o. male with multiple medical comorbidities and alcohol use disorder.  He was brought to the emergency department yesterday after being found down at his house.  He had head imaging showing acute on chronic subdural hematoma.  Neurosurgery has been consulted and they are managing conservatively at this time.  An abdominal CT scan was done that shows bilateral proximal obstructing ureteral stones with right greater than left hydronephrosis.  Patient is unable to provide a history.  His kidney function is unremarkable.  Urinalysis is positive for white blood cells was mostly unremarkable as well.  The patient spiked a temperature of 102 degrees this afternoon.   Past Medical History:  Diagnosis Date   Allergy    Distal radius fracture, left    Hypertension     Past Surgical History:  Procedure Laterality Date   BIOPSY  01/22/2021   Procedure: BIOPSY;  Surgeon: Shellia Cleverly, DO;  Location: [facility];  Service: Gastroenterology;;   ESOPHAGOGASTRODUODENOSCOPY (EGD) WITH PROPOFOL N/A 01/22/2021   Procedure: ESOPHAGOGASTRODUODENOSCOPY (EGD) WITH PROPOFOL;  Surgeon: Shellia Cleverly, DO;  Location: [facility];  Service: Gastroenterology;  Laterality: N/A;   I & D EXTREMITY Right 01/10/2020   Procedure: ORIF RIGHT WRIST;  Surgeon: Dominica Severin, MD;  Location: [facility];  Service: Orthopedics;  Laterality: Right;   IR ANGIO EXTERNAL CAROTID SEL EXT CAROTID UNI R MOD SED  10/04/2022   IR ANGIO INTRA EXTRACRAN SEL INTERNAL CAROTID UNI R MOD SED  09/30/2022   IR ANGIOGRAM FOLLOW UP STUDY  09/30/2022   IR NEURO EACH ADD'L AFTER BASIC UNI RIGHT (MS)  10/04/2022   IR TRANSCATH/EMBOLIZ  09/30/2022   OPEN REDUCTION INTERNAL FIXATION (ORIF) DISTAL RADIAL FRACTURE Left 01/28/2015   Procedure: OPEN REDUCTION INTERNAL FIXATION (ORIF) LEFT DISTAL RADIAL FRACTURE;  Surgeon:  Tarry Kos, MD;  Location: [facility];  Service: Orthopedics;  Laterality: Left;   ORIF HUMERUS FRACTURE Right 09/27/2022   Procedure: OPEN REDUCTION INTERNAL FIXATION (ORIF) PROXIMAL HUMERUS FRACTURE;  Surgeon: Teryl Lucy, MD;  Location: [facility];  Service: Orthopedics;  Laterality: Right;   RADIOLOGY WITH ANESTHESIA N/A 09/30/2022   Procedure: IR WITH ANESTHESIA MMA EMBOLIZATION;  Surgeon: Radiologist, Medication, MD;  Location: [facility];  Service: Radiology;  Laterality: N/A;   TONSILLECTOMY      Current Hospital Medications:  Home Meds:  No current facility-administered medications on file prior to encounter.   Current Outpatient Medications on File Prior to Encounter  Medication Sig Dispense Refill   DULoxetine (CYMBALTA) 30 MG capsule Take 1 capsule (30mg ) daily for one week then increase to 1 tablet (30mg ) twice daily. 60 capsule 1   naltrexone (DEPADE) 50 MG tablet Take 1 tablet (50 mg total) by mouth daily. 30 tablet 0   pantoprazole (PROTONIX) 40 MG tablet Take 1 tablet (40 mg total) by mouth daily. 30 tablet 0     Scheduled Meds:  DULoxetine  30 mg Oral Daily   LORazepam  0-4 mg Intravenous Q6H   Or   LORazepam  0-4 mg Oral Q6H   [START ON 01/25/2023] LORazepam  0-4 mg Intravenous Q12H   Or   [START ON 01/25/2023] LORazepam  0-4 mg Oral Q12H   magnesium oxide  400 mg Oral Daily   pantoprazole  40 mg Oral Daily   potassium chloride  40 mEq Oral Daily   thiamine  100  mg Oral Daily   Or   thiamine  100 mg Intravenous Daily   Continuous Infusions:  sodium chloride Stopped (01/23/23 0104)   acetaminophen 1,000 mg (01/23/23 1700)   piperacillin-tazobactam (ZOSYN)  IV 3.375 g (01/23/23 1656)   [START ON 01/24/2023] vancomycin     PRN Meds:.acetaminophen, acetaminophen **OR** acetaminophen, HYDROcodone-acetaminophen  Allergies: No Known Allergies  Family History  Problem Relation Age of Onset   Heart disease Mother    Hyperlipidemia Mother    Skin cancer  Mother    Cancer Father    Multiple myeloma Sister    Throat cancer Sister     Social History:  reports that he has been smoking cigarettes. He has a 10 pack-year smoking history. His smokeless tobacco use includes chew. He reports current alcohol use of about 16.0 - 20.0 standard drinks of alcohol per week. He reports that he does not use drugs.  ROS: A complete review of systems was performed.  All systems are negative except for pertinent findings as noted.  Physical Exam:  Vital signs in last 24 hours: Temp:  [98.4 F (36.9 C)-103.1 F (39.5 C)] 102.6 F (39.2 C) (08/12 1540) Pulse Rate:  [80-99] 97 (08/12 1600) Resp:  [16-24] 19 (08/12 1600) BP: (98-145)/(73-98) 128/88 (08/12 1600) SpO2:  [91 %-100 %] 91 % (08/12 1600) Weight:  [57.2 kg] 57.2 kg (08/12 0823) Constitutional:  Alert and oriented, No acute distress Cardiovascular: Regular rate and rhythm Respiratory: Normal respiratory effort, Lungs clear bilaterally GI: Abdomen is soft, nontender, nondistended, no abdominal masses Neurologic: Grossly intact, no focal deficits Psychiatric: Normal mood and affect  Laboratory Data:  Recent Labs    01/22/23 1523 01/22/23 1847 01/23/23 0333  WBC 8.5  --  4.9  HGB 12.5* 11.9* 9.9*  HCT 36.4* 35.0* 29.4*  PLT PLATELET CLUMPS NOTED ON SMEAR, UNABLE TO ESTIMATE  --  72*    Recent Labs    01/22/23 1743 01/22/23 1847 01/23/23 0104 01/23/23 0333  NA 130* 133*  --   --   K 2.5* 2.7* 3.5 3.3*  CL 92*  --   --   --   GLUCOSE 253*  --   --   --   BUN 29*  --   --   --   CALCIUM 9.7  --   --   --   CREATININE 1.13  --   --   --      Results for orders placed or performed during the hospital encounter of 01/22/23 (from the past 24 hour(s))  I-Stat Lactic Acid, ED     Status: Abnormal   Collection Time: 01/22/23  6:03 PM  Result Value Ref Range   Lactic Acid, Venous 4.2 (HH) 0.5 - 1.9 mmol/L   Comment NOTIFIED PHYSICIAN   I-Stat venous blood gas, ([facility], [facility], [facility])      Status: Abnormal   Collection Time: 01/22/23  6:47 PM  Result Value Ref Range   pH, Ven 7.468 (H) 7.25 - 7.43   pCO2, Ven 33.9 (L) 44 - 60 mmHg   pO2, Ven 80 (H) 32 - 45 mmHg   Bicarbonate 24.6 20.0 - 28.0 mmol/L   TCO2 26 22 - 32 mmol/L   O2 Saturation 97 %   Acid-Base Excess 1.0 0.0 - 2.0 mmol/L   Sodium 133 (L) 135 - 145 mmol/L   Potassium 2.7 (LL) 3.5 - 5.1 mmol/L   Calcium, Ion 1.18 1.15 - 1.40 mmol/L   HCT 35.0 (L) 39.0 - 52.0 %  Hemoglobin 11.9 (L) 13.0 - 17.0 g/dL   Sample type VENOUS    Comment NOTIFIED PHYSICIAN   CK     Status: None   Collection Time: 01/23/23  1:04 AM  Result Value Ref Range   Total CK 385 49 - 397 U/L  Potassium     Status: None   Collection Time: 01/23/23  1:04 AM  Result Value Ref Range   Potassium 3.5 3.5 - 5.1 mmol/L  Lactic acid, plasma     Status: None   Collection Time: 01/23/23  1:04 AM  Result Value Ref Range   Lactic Acid, Venous 1.6 0.5 - 1.9 mmol/L  Potassium     Status: Abnormal   Collection Time: 01/23/23  3:33 AM  Result Value Ref Range   Potassium 3.3 (L) 3.5 - 5.1 mmol/L  CBC     Status: Abnormal   Collection Time: 01/23/23  3:33 AM  Result Value Ref Range   WBC 4.9 4.0 - 10.5 K/uL   RBC 2.96 (L) 4.22 - 5.81 MIL/uL   Hemoglobin 9.9 (L) 13.0 - 17.0 g/dL   HCT 81.1 (L) 91.4 - 78.2 %   MCV 99.3 80.0 - 100.0 fL   MCH 33.4 26.0 - 34.0 pg   MCHC 33.7 30.0 - 36.0 g/dL   RDW 95.6 21.3 - 08.6 %   Platelets 72 (L) 150 - 400 K/uL   nRBC 0.0 0.0 - 0.2 %  Lactic acid, plasma     Status: None   Collection Time: 01/23/23  3:33 AM  Result Value Ref Range   Lactic Acid, Venous 1.4 0.5 - 1.9 mmol/L  Magnesium     Status: Abnormal   Collection Time: 01/23/23  9:22 AM  Result Value Ref Range   Magnesium 1.1 (L) 1.7 - 2.4 mg/dL   No results found for this or any previous visit (from the past 240 hour(s)).  Renal Function: Recent Labs    01/22/23 1743  CREATININE 1.13   Estimated Creatinine Clearance: 54.1 mL/min (by C-G formula  based on SCr of 1.13 mg/dL).  Radiologic Imaging: US RENAL  Result Date: 01/23/2023 CLINICAL DATA:  Fever EXAM: RENAL / URINARY TRACT ULTRASOUND COMPLETE COMPARISON:  CT abdomen 01/23/2023 FINDINGS: Right Kidney: Renal measurements: 12.5 by 6.5 by 5.7 cm = volume: 243 mL. Echogenicity within normal limits. Mild to moderate hydronephrosis, with suspected stone at the right UPJ shown on image 125 of series 1 -3. No mass identified. Left Kidney: Renal measurements: 10.7 by 6.4 by 5.4 cm = volume: 192 mL. Echogenicity within normal limits. No mass or hydronephrosis visualized. On image 45 of series 1 -1, the previously seen left renal pelvis stone is suspected of being present. Bladder: Appears normal for degree of bladder distention, although the ureteral jets are not visualized. Other: None. IMPRESSION: 1. Mild to moderate right hydronephrosis. The right UPJ stone shown on the CT scan from earlier this morning is again observed. 2. Left renal pelvis stone is again observed without substantial left hydronephrosis. Electronically Signed   By: Gaylyn Rong M.D.   On: 01/23/2023 11:52   CT Head Wo Contrast  Result Date: 01/23/2023 CLINICAL DATA:  Subdural hemorrhage follow up sdh EXAM: CT HEAD WITHOUT CONTRAST TECHNIQUE: Contiguous axial images were obtained from the base of the skull through the vertex without intravenous contrast. RADIATION DOSE REDUCTION: This exam was performed according to the departmental dose-optimization program which includes automated exposure control, adjustment of the mA and/or kV according to patient size and/or use  of iterative reconstruction technique. COMPARISON:  CT head 01/22/23 FINDINGS: Brain: Cerebral ventricle sizes are concordant with the degree of cerebral volume loss. No evidence of large-territorial acute infarction. No parenchymal hemorrhage. No mass lesion. Left anterior temporal encephalomalacia. Slightly increased in size acute on chronic 11 mm (from 9 mm) right  subdural hematoma. No new extra-axial fluid collection. No midline shift. Persistent mass effect on the right frontoparietal lobes and sulci. No hydrocephalus. Basilar cisterns are patent. Vascular: No hyperdense vessel. Skull: No acute fracture or focal lesion. Old left zygomatic arch fracture. Sinuses/Orbits: Left maxillary sinus mucosal thickening. Otherwise paranasal sinuses and mastoid air cells are clear. The orbits are unremarkable. Other: None. IMPRESSION: Slightly increased in size acute on chronic 11 mm (from 9 mm) right subdural hematoma. No associated midline shift. Electronically Signed   By: Tish Frederickson M.D.   On: 01/23/2023 02:05   CT ABDOMEN PELVIS WO CONTRAST  Result Date: 01/23/2023 CLINICAL DATA:  Pancreatitis. EXAM: CT ABDOMEN AND PELVIS WITHOUT CONTRAST TECHNIQUE: Multidetector CT imaging of the abdomen and pelvis was performed following the standard protocol without IV contrast. RADIATION DOSE REDUCTION: This exam was performed according to the departmental dose-optimization program which includes automated exposure control, adjustment of the mA and/or kV according to patient size and/or use of iterative reconstruction technique. COMPARISON:  CTs with IV contrast 11/15/2022 and 01/19/2021 FINDINGS: Lower chest: Lung bases are clear of infiltrates. There is a calcified granuloma in the right lower lobe. Mild posterior atelectatic changes. The cardiac size is normal. There are three-vessel coronary artery calcifications. Hepatobiliary: Moderately steatotic liver again noted with slight nodular changes along the left buttock surface consistent with at least early cirrhosis. Dense sludge in the gallbladder but no calcified stones. Pancreas: Hazy opacity in the peripancreatic interstitial tremors noted alongside the body and tail sections consistent with acute interstitial pancreatitis with interval mild swelling of this portion. Rest of the pancreas unremarkable without contrast. There is  no ductal dilatation. Spleen: Unremarkable without contrast.  Small splenule anteriorly. Adrenals/Urinary Tract: There is no adrenal mass. No contour deforming abnormality of the kidneys. There is bilateral renal swelling and perinephric edema. There is mild left and moderate right hydronephrosis. There are bilateral UPJ stones, on the right measuring 9 x 5 x 4 mm. On the left the stone is 7 x 6 x 3 mm. Both stones were previously in the inferior pole collecting systems. Both of the ureters are otherwise clear. Bladder is contracted but could be thickened. It was not previously thickened. Stomach/Bowel: No dilatation or wall thickening including the appendix. There is fluid throughout the colon. Uncomplicated sigmoid diverticulosis. Vascular/Lymphatic: Slightly prominent hepatic portal vein measuring 14.6 mm. Aortic atherosclerosis. 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 No enlarged abdominal or pelvic lymph nodes. Circumaortic left renal vein is again shown. Reproductive: Enlarged prostate, transverse axis 4.7 cm. Both testicles are in the scrotal sac. Other: There is minimal fluid in both perinephric spaces, minimal fluid extending along both pericolic gutters. No pelvic fluid is seen. There is no free air or free hemorrhage. Haziness in the mesenteric root fat is chronically noted. Musculoskeletal: Chronic bow tie shaped L1 compression fracture with bridging osteophytes to T12. Interval partial healing noted left acetabular posterior column fracture. No new fracture has become apparent. No acute or other significant osseous findings. Bridging osteophytes again both anterior SI joints. IMPRESSION: 1. Acute interstitial pancreatitis involving the body and tail sections. 2. Mild left and moderate right hydronephrosis with bilateral UPJ stones, on  the right 9 x 5 x 4 mm, on the left 7 x 6 x 3 mm. 3. Bilateral renal swelling and perinephric edema. Correlate clinically for infectious  complication. 4. Minimal fluid in both perinephric spaces, extending along both pericolic gutters. 5. Hepatic steatosis with slight nodular changes along the left hepatic surface consistent with early cirrhosis. 6. Slightly prominent hepatic portal vein and paraesophageal varices consistent with portal hypertension. 7. Fluid in the colon without evidence of bowel obstruction or inflammation. 8. Prostatomegaly with possible bladder thickening versus nondistention. 9. Aortic and coronary artery atherosclerosis. 10. Interval partial healing of left acetabular posterior column fracture. 11. Chronic L1 compression fracture. Aortic Atherosclerosis (ICD10-I70.0). Electronically Signed   By: Almira Bar M.D.   On: 01/23/2023 01:36   CT Cervical Spine Wo Contrast  Result Date: 01/22/2023 CLINICAL DATA:  Altered mental status.  Subdural hematoma. EXAM: CT CERVICAL SPINE WITHOUT CONTRAST TECHNIQUE: Multidetector CT imaging of the cervical spine was performed without intravenous contrast. Multiplanar CT image reconstructions were also generated. RADIATION DOSE REDUCTION: This exam was performed according to the departmental dose-optimization program which includes automated exposure control, adjustment of the mA and/or kV according to patient size and/or use of iterative reconstruction technique. COMPARISON:  CT of the cervical spine 11/15/2022 FINDINGS: Alignment: Slight degenerative anterolisthesis again noted at C3-4. Straightening of the normal cervical lordosis is stable. Skull base and vertebrae: The craniocervical junction is normal. Vertebral body heights are normal. No acute or healing fractures are present. Soft tissues and spinal canal: No prevertebral fluid or swelling. No visible canal hematoma. Disc levels: Multilevel degenerative changes of the cervical spine are similar to the prior exam. Foraminal narrowing is greatest on the right at C3-4, on the left at C4-5 and C5-6 and on the right at C6-7. Upper  chest: The lung apices are clear. Stable pleural calcifications are noted at both lung apices. IMPRESSION: 1. No acute or healing fractures. 2. Multilevel degenerative changes of the cervical spine are similar to the prior exam. Electronically Signed   By: Marin Roberts M.D.   On: 01/22/2023 16:49   CT Head Wo Contrast  Result Date: 01/22/2023 CLINICAL DATA:  Mental status. EXAM: CT HEAD WITHOUT CONTRAST TECHNIQUE: Contiguous axial images were obtained from the base of the skull through the vertex without intravenous contrast. RADIATION DOSE REDUCTION: This exam was performed according to the departmental dose-optimization program which includes automated exposure control, adjustment of the mA and/or kV according to patient size and/or use of iterative reconstruction technique. COMPARISON:  CT head 11/15/2022 FINDINGS: Brain: The previously seen subdural hematoma over the right frontal and parietal convexity is slightly decreased in size. Layering hyperdensity likely represents septi. A new more anterior extra-axial collection is present, consistent with a new subdural hematoma with layering blood products measuring approximately 9 mm. The new hemorrhage creates some local mass effect with partial effacement sulci. However the previously seen midline shift has resolved. The ventricles are of normal size. No acute cortical infarct is present. The deep brain nuclei are within normal limits. The brainstem and cerebellum are within normal limits. Midline structures are within normal limits. Vascular: No hyperdense vessel or unexpected calcification. Skull: Calvarium is intact. No focal lytic or blastic lesions are present. No significant extracranial soft tissue lesion is present. Sinuses/Orbits: The paranasal sinuses and mastoid air cells are clear. The globes and orbits are within normal limits. IMPRESSION: 1. New more anterior extra-axial collection, consistent with a new subdural hematoma with layering  blood products measuring approximately  9 mm. 2. The new hemorrhage creates some local mass effect with partial effacement sulci. However the previously seen midline shift has resolved. 3. The previously seen subdural hematoma over the right frontal and parietal convexity has decreased in size. No residual midline shift is present. 4. No acute cortical infarct. These results were called by telephone at the time of interpretation on 01/22/2023 at 4:40 pm to provider Dr. Edwin Dada , who verbally acknowledged these results. Electronically Signed   By: Marin Roberts M.D.   On: 01/22/2023 16:40    I independently reviewed the above imaging studies.  Impression/Recommendation 62 year old male with multiple medical comorbidities, history of alcohol abuse, with subdural hematoma found to have bilateral obstructing renal stones.  Patient febrile today.   -- Will take the patient to the operating room for bilateral ureteral stent placement -- Patient will need staged ureteroscopy once over his acute issues.  Irine Seal MD 01/23/2023, 5:56 PM  [facility]  Pager: [phone]

## 2023-01-23 NOTE — Progress Notes (Signed)
MEWS Progress Note  Patient Details Name: Richard Andrews MRN: [id number] DOB: 17-Jun-1960 Today's Date: 01/23/2023   MEWS Flowsheet Documentation:  Assess: MEWS Score Temp: (!) 102.6 F (39.2 C) BP: 128/88 MAP (mmHg): 99 Pulse Rate: 97 ECG Heart Rate: 95 Resp: 19 Level of Consciousness: Alert SpO2: 91 % O2 Device: Room Air Assess: MEWS Score MEWS Temp: 2 MEWS Systolic: 0 MEWS Pulse: 0 MEWS RR: 0 MEWS LOC: 0 MEWS Score: 2 MEWS Score Color: Yellow Assess: SIRS CRITERIA SIRS Temperature : 1 SIRS Respirations : 0 SIRS Pulse: 1 SIRS WBC: 0 SIRS Score Sum : 2 SIRS Temperature : 1 SIRS Pulse: 1 SIRS Respirations : 0 SIRS WBC: 0 SIRS Score Sum : 2 Assess: if the MEWS score is Yellow or Red Were vital signs accurate and taken at a resting state?: Yes Does the patient meet 2 or more of the SIRS criteria?: No MEWS guidelines implemented : Yes, yellow Treat MEWS Interventions: Considered administering scheduled or prn medications/treatments as ordered Take Vital Signs Increase Vital Sign Frequency : Yellow: Q2hr x1, continue Q4hrs until patient remains green for 12hrs Escalate MEWS: Escalate: Yellow: Discuss with charge nurse and consider notifying provider and/or RRT Notify: Charge Nurse/RN Name of Charge Nurse/RN Notified: Delicia RN Provider Notification Provider Name/Title: Dr. Renford Dills Date Provider Notified: 01/23/23 Time Provider Notified: 1615 Method of Notification: Page Notification Reason: Change in status Provider response: See new orders Date of Provider Response: 01/23/23 Time of Provider Response: 1620       Tiney Rouge 01/23/2023, 4:36 PM

## 2023-01-23 NOTE — ED Notes (Signed)
US at bedside

## 2023-01-23 NOTE — Anesthesia Postprocedure Evaluation (Signed)
Anesthesia Post Note  Patient: Richard Andrews  Procedure(s) Performed: 1. Cystoscopy 2. Bilateral  retrograde pyelogram with interpretation 3. bilateral ureteral stent placement (6x24 cm on the right, 6x26 cm on the left) 4. Fluoroscopy <1 hour with intraoperative interpretation (Bilateral: Ureter)     Patient location during evaluation: PACU Anesthesia Type: General Level of consciousness: lethargic Pain management: pain level controlled Vital Signs Assessment: post-procedure vital signs reviewed and stable Respiratory status: spontaneous breathing, nonlabored ventilation, respiratory function stable and patient connected to nasal cannula oxygen Cardiovascular status: blood pressure returned to baseline and stable Postop Assessment: no apparent nausea or vomiting Anesthetic complications: no   No notable events documented.  Last Vitals:  Vitals:   01/23/23 2115 01/23/23 2131  BP: 113/78 99/72  Pulse: 63 66  Resp: 15 14  Temp:  36.5 C  SpO2: 100% 97%    Last Pain:  Vitals:   01/23/23 2131  TempSrc:   PainSc: Asleep                 Collene Schlichter

## 2023-01-23 NOTE — Progress Notes (Signed)
Pharmacy Antibiotic Note  Richard Andrews is a 62 y.o. male admitted on 01/22/2023 with sepsis.  Pharmacy has been consulted for vancomycin and zosyn dosing. Pt is febrile with Tmax 103.1 and WBC is WNL. Scr is WNL and lactic acid has normalized.   Plan: Vanc 1g IV Q24H  Zosyn 3.375gm IV Q8H (4 hr inf) F/u renal fxn, C&S, clinical status and peak/trough at SS  Height: 5\' 11"  (180.3 cm) Weight: 57.2 kg (126 lb) IBW/kg (Calculated) : 75.3  Temp (24hrs), Avg:100.5 F (38.1 C), Min:99 F (37.2 C), Max:103.1 F (39.5 C)  Recent Labs  Lab 01/22/23 1523 01/22/23 1544 01/22/23 1743 01/22/23 1803 01/23/23 0104 01/23/23 0333  WBC 8.5  --   --   --   --  4.9  CREATININE  --   --  1.13  --   --   --   LATICACIDVEN  --  2.8*  --  4.2* 1.6 1.4    Estimated Creatinine Clearance: 54.1 mL/min (by C-G formula based on SCr of 1.13 mg/dL).    No Known Allergies  Antimicrobials this admission: Vanc 8/12>> Zosyn 8/12>>  Dose adjustments this admission: N/A  Microbiology results: Pending  Thank you for allowing pharmacy to be a part of this patient's care.  , Drake Leach 01/23/2023 8:48 AM

## 2023-01-23 NOTE — Progress Notes (Signed)
Patient ID: Richard Andrews, male   DOB: Feb 29, 1961, 62 y.o.   MRN: [id number] Ct today shows slight worsening of Right SDH but still without shift or mass effect. Clinically patient about the same. Aroused to voice and will follow commands with constant stimulation and moves all 4 extremities.  Plan is simple observation.

## 2023-01-24 ENCOUNTER — Encounter (HOSPITAL_COMMUNITY): Payer: Self-pay | Admitting: Urology

## 2023-01-24 DIAGNOSIS — I609 Nontraumatic subarachnoid hemorrhage, unspecified: Secondary | ICD-10-CM

## 2023-01-24 DIAGNOSIS — F1020 Alcohol dependence, uncomplicated: Secondary | ICD-10-CM

## 2023-01-24 DIAGNOSIS — R7881 Bacteremia: Secondary | ICD-10-CM | POA: Diagnosis present

## 2023-01-24 DIAGNOSIS — E876 Hypokalemia: Secondary | ICD-10-CM | POA: Diagnosis not present

## 2023-01-24 DIAGNOSIS — R748 Abnormal levels of other serum enzymes: Secondary | ICD-10-CM | POA: Diagnosis not present

## 2023-01-24 DIAGNOSIS — E871 Hypo-osmolality and hyponatremia: Secondary | ICD-10-CM

## 2023-01-24 DIAGNOSIS — R5381 Other malaise: Secondary | ICD-10-CM

## 2023-01-24 DIAGNOSIS — S065XAA Traumatic subdural hemorrhage with loss of consciousness status unknown, initial encounter: Secondary | ICD-10-CM | POA: Diagnosis not present

## 2023-01-24 LAB — C DIFFICILE QUICK SCREEN W PCR REFLEX
C Diff antigen: POSITIVE — AB
C Diff toxin: NEGATIVE

## 2023-01-24 LAB — CLOSTRIDIUM DIFFICILE BY PCR, REFLEXED: Toxigenic C. Difficile by PCR: POSITIVE — AB

## 2023-01-24 LAB — PHOSPHORUS: Phosphorus: 3.1 mg/dL (ref 2.5–4.6)

## 2023-01-24 MED ORDER — MAGNESIUM SULFATE 4 GM/100ML IV SOLN
4.0000 g | Freq: Once | INTRAVENOUS | Status: AC
  Administered 2023-01-24: 4 g via INTRAVENOUS
  Filled 2023-01-24: qty 100

## 2023-01-24 MED ORDER — GERHARDT'S BUTT CREAM
TOPICAL_CREAM | Freq: Two times a day (BID) | CUTANEOUS | Status: DC
  Administered 2023-01-24: 1 via TOPICAL
  Filled 2023-01-24: qty 1

## 2023-01-24 MED ORDER — SODIUM CHLORIDE 0.9 % IV SOLN
2.0000 g | INTRAVENOUS | Status: DC
  Administered 2023-01-24 – 2023-01-26 (×14): 2 g via INTRAVENOUS
  Filled 2023-01-24 (×11): qty 2000

## 2023-01-24 MED ORDER — THIAMINE HCL 100 MG/ML IJ SOLN
500.0000 mg | Freq: Once | INTRAVENOUS | Status: AC
  Administered 2023-01-24: 500 mg via INTRAVENOUS
  Filled 2023-01-24: qty 5
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
## 2023-01-24 NOTE — Progress Notes (Addendum)
PHARMACY - PHYSICIAN COMMUNICATION CRITICAL VALUE ALERT - BLOOD CULTURE IDENTIFICATION (BCID)  Richard Andrews is an 62 y.o. male who presented to [facility] on 01/22/2023 with SDH, found to have bilateral ureteral stones s/p cytoscopy  Assessment:   Blood cultures growing Enterococcus faecalis  Name of physician (or Provider) Contacted:  Dr. Arlean Hopping  Current antibiotics:  Vancomycin and Zosyn  Changes to prescribed antibiotics recommended:  Change to Ampicillin 2 g IV q4h  Results for orders placed or performed during the hospital encounter of 01/22/23  Blood Culture ID Panel (Reflexed) (Collected: 01/23/2023  8:20 AM)  Result Value Ref Range   Enterococcus faecalis DETECTED (A) NOT DETECTED   Enterococcus Faecium NOT DETECTED NOT DETECTED   Listeria monocytogenes NOT DETECTED NOT DETECTED   Staphylococcus species NOT DETECTED NOT DETECTED   Staphylococcus aureus (BCID) NOT DETECTED NOT DETECTED   Staphylococcus epidermidis NOT DETECTED NOT DETECTED   Staphylococcus lugdunensis NOT DETECTED NOT DETECTED   Streptococcus species NOT DETECTED NOT DETECTED   Streptococcus agalactiae NOT DETECTED NOT DETECTED   Streptococcus pneumoniae NOT DETECTED NOT DETECTED   Streptococcus pyogenes NOT DETECTED NOT DETECTED   A.calcoaceticus-baumannii NOT DETECTED NOT DETECTED   Bacteroides fragilis NOT DETECTED NOT DETECTED   Enterobacterales NOT DETECTED NOT DETECTED   Enterobacter cloacae complex NOT DETECTED NOT DETECTED   Escherichia coli NOT DETECTED NOT DETECTED   Klebsiella aerogenes NOT DETECTED NOT DETECTED   Klebsiella oxytoca NOT DETECTED NOT DETECTED   Klebsiella pneumoniae NOT DETECTED NOT DETECTED   Proteus species NOT DETECTED NOT DETECTED   Salmonella species NOT DETECTED NOT DETECTED   Serratia marcescens NOT DETECTED NOT DETECTED   Haemophilus influenzae NOT DETECTED NOT DETECTED   Neisseria meningitidis NOT DETECTED NOT DETECTED   Pseudomonas aeruginosa NOT DETECTED NOT  DETECTED   Stenotrophomonas maltophilia NOT DETECTED NOT DETECTED   Candida albicans NOT DETECTED NOT DETECTED   Candida auris NOT DETECTED NOT DETECTED   Candida glabrata NOT DETECTED NOT DETECTED   Candida krusei NOT DETECTED NOT DETECTED   Candida parapsilosis NOT DETECTED NOT DETECTED   Candida tropicalis NOT DETECTED NOT DETECTED   Cryptococcus neoformans/gattii NOT DETECTED NOT DETECTED   Vancomycin resistance NOT DETECTED NOT DETECTED    [name] 01/24/2023  5:29 AM

## 2023-01-24 NOTE — Plan of Care (Signed)
  Problem: Health Behavior/Discharge Planning: Goal: Ability to manage health-related needs will improve Outcome: Progressing   Problem: Clinical Measurements: Goal: Ability to maintain clinical measurements within normal limits will improve Outcome: Progressing Goal: Will remain free from infection Outcome: Progressing Goal: Diagnostic test results will improve Outcome: Progressing Goal: Respiratory complications will improve Outcome: Progressing   Problem: Nutrition: Goal: Adequate nutrition will be maintained Outcome: Progressing   Problem: Coping: Goal: Level of anxiety will decrease Outcome: Progressing
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
## 2023-01-24 NOTE — Progress Notes (Signed)
1 Day Post-Op Subjective: Patient resting in bed on arrival.  No acute events overnight.  Confused and minimally responsive  Objective: Vital signs in last 24 hours: Temp:  [97.6 F (36.4 C)-102.6 F (39.2 C)] 97.7 F (36.5 C) (08/13 1000) Pulse Rate:  [61-97] 77 (08/13 1200) Resp:  [12-24] 16 (08/13 1200) BP: (99-145)/(72-98) 107/76 (08/13 1200) SpO2:  [91 %-100 %] 100 % (08/13 1200)  Assessment/Plan: 62 year old male found down with multiple medical comorbidities and alcohol use disorder.  Subdural hematoma CT A/P shows bilateral obstructing ureteral stones  # Ureteral stones # To the OR with bilateral ureteral stent placement with Dr. Lafonda Mosses on 8/12. Will need to arrange for staged ureteroscopy once patient is over his acute phase.  Intake/Output from previous day: 08/12 0701 - 08/13 0700 In: 350 [I.V.:350] Out: 1055 [Urine:1050; Blood:5]  Intake/Output this shift: Total I/O In: 913.7 [I.V.:813.7; IV Piggyback:100] Out: -   Physical Exam:  General: Confused, alert CV: No cyanosis Lungs: equal chest rise Abdomen: Soft, NTND, no rebound or guarding Gu: Pure wick male catheter in place  Lab Results: Recent Labs    01/22/23 1847 01/23/23 0333 01/24/23 0608  HGB 11.9* 9.9* 10.0*  HCT 35.0* 29.4* 29.1*   BMET Recent Labs    01/22/23 1743 01/22/23 1847 01/23/23 0104 01/23/23 0333 01/24/23 0608  NA 130* 133*  --   --  133*  K 2.5* 2.7*   < > 3.3* 3.2*  CL 92*  --   --   --  98  CO2 21*  --   --   --  23  GLUCOSE 253*  --   --   --  133*  BUN 29*  --   --   --  15  CREATININE 1.13  --   --   --  0.66  CALCIUM 9.7  --   --   --  8.8*   < > = values in this interval not displayed.     Studies/Results: DG C-Arm 1-60 Min  Result Date: 01/23/2023 CLINICAL DATA:  Cystography. EXAM: DG C-ARM 1-60 MIN CONTRAST:  None. FLUOROSCOPY: Fluoroscopy Time:  47.7 seconds Radiation Exposure Index (if provided by the fluoroscopic device): 4.9167 mGy Number of  Acquired Spot Images: 1 COMPARISON:  None Available. FINDINGS: A single image demonstrates a left ureteral stent. Please see operative report for additional information. IMPRESSION: Intraoperative utilization of fluoroscopy. Electronically Signed   By: Thornell Sartorius M.D.   On: 01/23/2023 22:30   US RENAL  Result Date: 01/23/2023 CLINICAL DATA:  Fever EXAM: RENAL / URINARY TRACT ULTRASOUND COMPLETE COMPARISON:  CT abdomen 01/23/2023 FINDINGS: Right Kidney: Renal measurements: 12.5 by 6.5 by 5.7 cm = volume: 243 mL. Echogenicity within normal limits. Mild to moderate hydronephrosis, with suspected stone at the right UPJ shown on image 125 of series 1 -3. No mass identified. Left Kidney: Renal measurements: 10.7 by 6.4 by 5.4 cm = volume: 192 mL. Echogenicity within normal limits. No mass or hydronephrosis visualized. On image 45 of series 1 -1, the previously seen left renal pelvis stone is suspected of being present. Bladder: Appears normal for degree of bladder distention, although the ureteral jets are not visualized. Other: None. IMPRESSION: 1. Mild to moderate right hydronephrosis. The right UPJ stone shown on the CT scan from earlier this morning is again observed. 2. Left renal pelvis stone is again observed without substantial left hydronephrosis. Electronically Signed   By: Gaylyn Rong M.D.   On: 01/23/2023 11:52  CT Head Wo Contrast  Result Date: 01/23/2023 CLINICAL DATA:  Subdural hemorrhage follow up sdh EXAM: CT HEAD WITHOUT CONTRAST TECHNIQUE: Contiguous axial images were obtained from the base of the skull through the vertex without intravenous contrast. RADIATION DOSE REDUCTION: This exam was performed according to the departmental dose-optimization program which includes automated exposure control, adjustment of the mA and/or kV according to patient size and/or use of iterative reconstruction technique. COMPARISON:  CT head 01/22/23 FINDINGS: Brain: Cerebral ventricle sizes are  concordant with the degree of cerebral volume loss. No evidence of large-territorial acute infarction. No parenchymal hemorrhage. No mass lesion. Left anterior temporal encephalomalacia. Slightly increased in size acute on chronic 11 mm (from 9 mm) right subdural hematoma. No new extra-axial fluid collection. No midline shift. Persistent mass effect on the right frontoparietal lobes and sulci. No hydrocephalus. Basilar cisterns are patent. Vascular: No hyperdense vessel. Skull: No acute fracture or focal lesion. Old left zygomatic arch fracture. Sinuses/Orbits: Left maxillary sinus mucosal thickening. Otherwise paranasal sinuses and mastoid air cells are clear. The orbits are unremarkable. Other: None. IMPRESSION: Slightly increased in size acute on chronic 11 mm (from 9 mm) right subdural hematoma. No associated midline shift. Electronically Signed   By: Tish Frederickson M.D.   On: 01/23/2023 02:05   CT ABDOMEN PELVIS WO CONTRAST  Result Date: 01/23/2023 CLINICAL DATA:  Pancreatitis. EXAM: CT ABDOMEN AND PELVIS WITHOUT CONTRAST TECHNIQUE: Multidetector CT imaging of the abdomen and pelvis was performed following the standard protocol without IV contrast. RADIATION DOSE REDUCTION: This exam was performed according to the departmental dose-optimization program which includes automated exposure control, adjustment of the mA and/or kV according to patient size and/or use of iterative reconstruction technique. COMPARISON:  CTs with IV contrast 11/15/2022 and 01/19/2021 FINDINGS: Lower chest: Lung bases are clear of infiltrates. There is a calcified granuloma in the right lower lobe. Mild posterior atelectatic changes. The cardiac size is normal. There are three-vessel coronary artery calcifications. Hepatobiliary: Moderately steatotic liver again noted with slight nodular changes along the left buttock surface consistent with at least early cirrhosis. Dense sludge in the gallbladder but no calcified stones. Pancreas:  Hazy opacity in the peripancreatic interstitial tremors noted alongside the body and tail sections consistent with acute interstitial pancreatitis with interval mild swelling of this portion. Rest of the pancreas unremarkable without contrast. There is no ductal dilatation. Spleen: Unremarkable without contrast.  Small splenule anteriorly. Adrenals/Urinary Tract: There is no adrenal mass. No contour deforming abnormality of the kidneys. There is bilateral renal swelling and perinephric edema. There is mild left and moderate right hydronephrosis. There are bilateral UPJ stones, on the right measuring 9 x 5 x 4 mm. On the left the stone is 7 x 6 x 3 mm. Both stones were previously in the inferior pole collecting systems. Both of the ureters are otherwise clear. Bladder is contracted but could be thickened. It was not previously thickened. Stomach/Bowel: No dilatation or wall thickening including the appendix. There is fluid throughout the colon. Uncomplicated sigmoid diverticulosis. Vascular/Lymphatic: Slightly prominent hepatic portal vein measuring 14.6 mm. Aortic atherosclerosis. 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 No enlarged abdominal or pelvic lymph nodes. Circumaortic left renal vein is again shown. Reproductive: Enlarged prostate, transverse axis 4.7 cm. Both testicles are in the scrotal sac. Other: There is minimal fluid in both perinephric spaces, minimal fluid extending along both pericolic gutters. No pelvic fluid is seen. There is no free air or free hemorrhage. Haziness in the mesenteric root  fat is chronically noted. Musculoskeletal: Chronic bow tie shaped L1 compression fracture with bridging osteophytes to T12. Interval partial healing noted left acetabular posterior column fracture. No new fracture has become apparent. No acute or other significant osseous findings. Bridging osteophytes again both anterior SI joints. IMPRESSION: 1. Acute interstitial pancreatitis involving  the body and tail sections. 2. Mild left and moderate right hydronephrosis with bilateral UPJ stones, on the right 9 x 5 x 4 mm, on the left 7 x 6 x 3 mm. 3. Bilateral renal swelling and perinephric edema. Correlate clinically for infectious complication. 4. Minimal fluid in both perinephric spaces, extending along both pericolic gutters. 5. Hepatic steatosis with slight nodular changes along the left hepatic surface consistent with early cirrhosis. 6. Slightly prominent hepatic portal vein and paraesophageal varices consistent with portal hypertension. 7. Fluid in the colon without evidence of bowel obstruction or inflammation. 8. Prostatomegaly with possible bladder thickening versus nondistention. 9. Aortic and coronary artery atherosclerosis. 10. Interval partial healing of left acetabular posterior column fracture. 11. Chronic L1 compression fracture. Aortic Atherosclerosis (ICD10-I70.0). Electronically Signed   By: Almira Bar M.D.   On: 01/23/2023 01:36   CT Cervical Spine Wo Contrast  Result Date: 01/22/2023 CLINICAL DATA:  Altered mental status.  Subdural hematoma. EXAM: CT CERVICAL SPINE WITHOUT CONTRAST TECHNIQUE: Multidetector CT imaging of the cervical spine was performed without intravenous contrast. Multiplanar CT image reconstructions were also generated. RADIATION DOSE REDUCTION: This exam was performed according to the departmental dose-optimization program which includes automated exposure control, adjustment of the mA and/or kV according to patient size and/or use of iterative reconstruction technique. COMPARISON:  CT of the cervical spine 11/15/2022 FINDINGS: Alignment: Slight degenerative anterolisthesis again noted at C3-4. Straightening of the normal cervical lordosis is stable. Skull base and vertebrae: The craniocervical junction is normal. Vertebral body heights are normal. No acute or healing fractures are present. Soft tissues and spinal canal: No prevertebral fluid or swelling.  No visible canal hematoma. Disc levels: Multilevel degenerative changes of the cervical spine are similar to the prior exam. Foraminal narrowing is greatest on the right at C3-4, on the left at C4-5 and C5-6 and on the right at C6-7. Upper chest: The lung apices are clear. Stable pleural calcifications are noted at both lung apices. IMPRESSION: 1. No acute or healing fractures. 2. Multilevel degenerative changes of the cervical spine are similar to the prior exam. Electronically Signed   By: Marin Roberts M.D.   On: 01/22/2023 16:49   CT Head Wo Contrast  Result Date: 01/22/2023 CLINICAL DATA:  Mental status. EXAM: CT HEAD WITHOUT CONTRAST TECHNIQUE: Contiguous axial images were obtained from the base of the skull through the vertex without intravenous contrast. RADIATION DOSE REDUCTION: This exam was performed according to the departmental dose-optimization program which includes automated exposure control, adjustment of the mA and/or kV according to patient size and/or use of iterative reconstruction technique. COMPARISON:  CT head 11/15/2022 FINDINGS: Brain: The previously seen subdural hematoma over the right frontal and parietal convexity is slightly decreased in size. Layering hyperdensity likely represents septi. A new more anterior extra-axial collection is present, consistent with a new subdural hematoma with layering blood products measuring approximately 9 mm. The new hemorrhage creates some local mass effect with partial effacement sulci. However the previously seen midline shift has resolved. The ventricles are of normal size. No acute cortical infarct is present. The deep brain nuclei are within normal limits. The brainstem and cerebellum are within normal limits. Midline structures  are within normal limits. Vascular: No hyperdense vessel or unexpected calcification. Skull: Calvarium is intact. No focal lytic or blastic lesions are present. No significant extracranial soft tissue lesion is  present. Sinuses/Orbits: The paranasal sinuses and mastoid air cells are clear. The globes and orbits are within normal limits. IMPRESSION: 1. New more anterior extra-axial collection, consistent with a new subdural hematoma with layering blood products measuring approximately 9 mm. 2. The new hemorrhage creates some local mass effect with partial effacement sulci. However the previously seen midline shift has resolved. 3. The previously seen subdural hematoma over the right frontal and parietal convexity has decreased in size. No residual midline shift is present. 4. No acute cortical infarct. These results were called by telephone at the time of interpretation on 01/22/2023 at 4:40 pm to provider Dr. Edwin Dada , who verbally acknowledged these results. Electronically Signed   By: Marin Roberts M.D.   On: 01/22/2023 16:40      LOS: 2 days   Elmon Kirschner, NP [facility] Pager: [phone]  01/24/2023, 1:48 PM

## 2023-01-25 ENCOUNTER — Inpatient Hospital Stay (HOSPITAL_COMMUNITY): Payer: BC Managed Care – PPO

## 2023-01-25 DIAGNOSIS — R7881 Bacteremia: Secondary | ICD-10-CM | POA: Diagnosis not present

## 2023-01-25 DIAGNOSIS — I609 Nontraumatic subarachnoid hemorrhage, unspecified: Secondary | ICD-10-CM | POA: Diagnosis not present

## 2023-01-25 DIAGNOSIS — E872 Acidosis, unspecified: Secondary | ICD-10-CM | POA: Diagnosis not present

## 2023-01-25 DIAGNOSIS — S065XAA Traumatic subdural hemorrhage with loss of consciousness status unknown, initial encounter: Secondary | ICD-10-CM | POA: Diagnosis not present

## 2023-01-25 DIAGNOSIS — E876 Hypokalemia: Secondary | ICD-10-CM | POA: Diagnosis not present

## 2023-01-25 DIAGNOSIS — A0472 Enterocolitis due to Clostridium difficile, not specified as recurrent: Secondary | ICD-10-CM

## 2023-01-25 DIAGNOSIS — K760 Fatty (change of) liver, not elsewhere classified: Secondary | ICD-10-CM | POA: Diagnosis not present

## 2023-01-25 DIAGNOSIS — B952 Enterococcus as the cause of diseases classified elsewhere: Secondary | ICD-10-CM

## 2023-01-25 DIAGNOSIS — F1020 Alcohol dependence, uncomplicated: Secondary | ICD-10-CM | POA: Diagnosis not present

## 2023-01-25 MED ORDER — ENOXAPARIN SODIUM 40 MG/0.4ML IJ SOSY
40.0000 mg | PREFILLED_SYRINGE | INTRAMUSCULAR | Status: DC
  Administered 2023-01-25 – 2023-02-05 (×12): 40 mg via SUBCUTANEOUS
  Filled 2023-01-25 (×12): qty 0.4

## 2023-01-25 MED ORDER — GERHARDT'S BUTT CREAM
TOPICAL_CREAM | Freq: Four times a day (QID) | CUTANEOUS | Status: DC
  Administered 2023-01-25 – 2023-01-26 (×2): 1 via TOPICAL
  Filled 2023-01-25: qty 1

## 2023-01-25 MED ORDER — POTASSIUM CHLORIDE 20 MEQ PO PACK
40.0000 meq | PACK | ORAL | Status: AC
  Administered 2023-01-25 (×2): 40 meq via ORAL
  Filled 2023-01-25 (×2): qty 2

## 2023-01-25 MED ORDER — POTASSIUM CHLORIDE CRYS ER 20 MEQ PO TBCR
40.0000 meq | EXTENDED_RELEASE_TABLET | Freq: Once | ORAL | Status: DC
Start: 2023-01-25 — End: 2023-01-25

## 2023-01-25 MED ORDER — MAGNESIUM SULFATE 2 GM/50ML IV SOLN
2.0000 g | Freq: Once | INTRAVENOUS | Status: AC
  Administered 2023-01-25: 2 g via INTRAVENOUS
  Filled 2023-01-25: qty 50

## 2023-01-25 MED ORDER — POTASSIUM PHOSPHATES 15 MMOLE/5ML IV SOLN
30.0000 mmol | Freq: Once | INTRAVENOUS | Status: AC
  Administered 2023-01-25: 30 mmol via INTRAVENOUS
  Filled 2023-01-25: qty 10

## 2023-01-25 NOTE — Progress Notes (Addendum)
PROGRESS NOTE        PATIENT DETAILS Name: Richard Andrews Age: 62 y.o. Sex: male Date of Birth: [birth year] Admit Date: 01/22/2023 Admitting Physician Andris Baumann, MD VHQ:IONGEX, Joy, NP  Brief Summary: Patient is a 62 y.o.  male with history of EtOH use-prior hospitalizations related to complications of alcohol use-including subdural hematoma/left tibial plateau fracture-who was brought to the ED on 8/11-after he was found down and confused-upon further evaluation-he was found to have a new subdural hematoma, acute pancreatitis, bilateral hydronephrosis with renal stones and enterococcal bacteremia.  Significant events: 8/11>> admit to [facility]  Significant studies: 8/11>> CT head: New SDH 8/11>> CT C-spine: No fracture 8/12>> CT abdomen/pelvis: Acute pancreatitis, bilateral hydronephrosis with bilateral UPJ stones. 8/12>> CT head: Slightly increased size of SDH. 8/12>> renal ultrasound: Mild to moderate right hydronephrosis, left renal pelvic stone is again observed without substantial left hydronephrosis. 8/14>> TTE: EF 50-55%-no obvious vegetation  Significant microbiology data: 8/12>> blood culture: Enterococcus 8/13>> C. difficile study: Positive 8/13>> GI pathogen panel: EPEC detected 8/14>> blood culture: Pending  Procedures: 8/12>> bilateral ureteral stent placement by urology  Consults: ID Neurology Neurosurgery  Subjective: Lying comfortably in bed-denies any chest pain or shortness of breath.  Objective: Vitals: Blood pressure 139/82, pulse 65, temperature 98 F (36.7 C), temperature source Oral, resp. rate 12, height 5\' 11"  (1.803 m), weight 57.2 kg, SpO2 99%.   Exam: Gen Exam:Alert awake-not in any distress HEENT:atraumatic, normocephalic Chest: B/L clear to auscultation anteriorly CVS:S1S2 regular Abdomen:soft non tender, non distended Extremities:no edema Neurology: Non focal Skin: no rash  Pertinent Labs/Radiology:     Latest Ref Rng & Units 01/25/2023    2:33 AM 01/24/2023    6:08 AM 01/23/2023    3:33 AM  CBC  WBC 4.0 - 10.5 K/uL 5.3  3.8  4.9   Hemoglobin 13.0 - 17.0 g/dL 52.8  41.3  9.9   Hematocrit 39.0 - 52.0 % 29.3  29.1  29.4   Platelets 150 - 400 K/uL 99  69  72     Lab Results  Component Value Date   NA 134 (L) 01/25/2023   K 3.0 (L) 01/25/2023   CL 101 01/25/2023   CO2 24 01/25/2023      Assessment/Plan: Subdural hematoma Likely secondary to fall from being intoxicated Neurosurgery following-recommendations are for observation  Acute metabolic encephalopathy Secondary to combination of enterococcal bacteremia/alcohol withdrawal/SDH Seems to be much better today-awake/alert Supportive care-treat underlying etiologies  Sepsis secondary to enterococcal bacteremia Sepsis physiology improving IV ampicillin TTE negative for vegetation Repeat cultures pending ID following  Acute pancreatitis Exam benign-but positive lipase/imaging studies Supportive care  Bilateral hydronephrosis with UPJ stone Urology following-s/p bilateral ureteral stent placement on 8/12  Enteropathogenic E. coli colitis Not much diarrhea Already on ampicillin Although C. difficile antigen/toxin positive-suspect this is more for colonization rather than an infection as diarrhea is not much of a future.  EtOH use Awake/alert CIWA Thiamine/folic acid  Hypokalemia/hypomagnesemia/hypophosphatemia Secondary to EtOH use Replete/recheck  Thrombocytopenia Probably secondary to EtOH use/sepsis Improving Appears to have some amount of baseline thrombocytopenia  Normocytic anemia Secondary to acute illness No evidence of blood loss Follow CBC  History of peptic ulcer disease PPI  Mood disorder Cymbalta  Debility/deconditioning PT/OT eval  Underweight: Estimated body mass index is 17.57 kg/m as calculated from the following:   Height as  of this encounter: 5\' 11"  (1.803 m).   Weight as of this  encounter: 57.2 kg.   Code status:   Code Status: DNR   DVT Prophylaxis: enoxaparin (LOVENOX) injection 40 mg Start: 01/25/23 1330 SCDs Start: 01/23/23 0037   Family Communication: None at bedside   Disposition Plan: Status is: Inpatient Remains inpatient appropriate because: Severity of illness   Planned Discharge Destination:Home health vs SNF   Diet: Diet Order             Diet regular Room service appropriate? Yes; Fluid consistency: Thin  Diet effective now                     Antimicrobial agents: Anti-infectives (From admission, onward)    Start     Dose/Rate Route Frequency Ordered Stop   01/24/23 0800  vancomycin (VANCOCIN) IVPB 1000 mg/200 mL premix  Status:  Discontinued        1,000 mg 200 mL/hr over 60 Minutes Intravenous Every 24 hours 01/23/23 0847 01/24/23 0537   01/24/23 0800  ampicillin (OMNIPEN) 2 g in sodium chloride 0.9 % 100 mL IVPB        2 g 300 mL/hr over 20 Minutes Intravenous Every 4 hours 01/24/23 0537     01/23/23 1600  piperacillin-tazobactam (ZOSYN) IVPB 3.375 g  Status:  Discontinued        3.375 g 12.5 mL/hr over 240 Minutes Intravenous Every 8 hours 01/23/23 0847 01/24/23 0537   01/23/23 0845  piperacillin-tazobactam (ZOSYN) IVPB 3.375 g        3.375 g 100 mL/hr over 30 Minutes Intravenous  Once 01/23/23 0842 01/23/23 0940   01/23/23 0845  vancomycin (VANCOCIN) IVPB 1000 mg/200 mL premix        1,000 mg 200 mL/hr over 60 Minutes Intravenous  Once 01/23/23 0842 01/23/23 1046        MEDICATIONS: Scheduled Meds:  DULoxetine  30 mg Oral Daily   enoxaparin (LOVENOX) injection  40 mg Subcutaneous Q24H   Gerhardt's butt cream   Topical QID   LORazepam  0-4 mg Intravenous Q12H   Or   LORazepam  0-4 mg Oral Q12H   pantoprazole  40 mg Oral Daily   thiamine  100 mg Oral Daily   Or   thiamine  100 mg Intravenous Daily   Continuous Infusions:  sodium chloride 50 mL/hr at 01/24/23 1401   ampicillin (OMNIPEN) IV 2 g (01/25/23  1214)   potassium PHOSPHATE IVPB (in mmol) 30 mmol (01/25/23 0801)   PRN Meds:.acetaminophen **OR** acetaminophen, HYDROcodone-acetaminophen   I have personally reviewed following labs and imaging studies  LABORATORY DATA: CBC: Recent Labs  Lab 01/22/23 1523 01/22/23 1847 01/23/23 0333 01/24/23 0608 01/25/23 0233  WBC 8.5  --  4.9 3.8* 5.3  NEUTROABS 6.5  --   --   --  3.9  HGB 12.5* 11.9* 9.9* 10.0* 10.0*  HCT 36.4* 35.0* 29.4* 29.1* 29.3*  MCV 100.8*  --  99.3 98.6 100.7*  PLT PLATELET CLUMPS NOTED ON SMEAR, UNABLE TO ESTIMATE  --  72* 69* 99*    Basic Metabolic Panel: Recent Labs  Lab 01/22/23 1632 01/22/23 1743 01/22/23 1743 01/22/23 1847 01/23/23 0104 01/23/23 0333 01/23/23 0922 01/24/23 0608 01/25/23 0233  NA  --  130*  --  133*  --   --   --  133* 134*  K  --  2.5*   < > 2.7* 3.5 3.3*  --  3.2* 3.0*  CL  --  92*  --   --   --   --   --  98 101  CO2  --  21*  --   --   --   --   --  23 24  GLUCOSE  --  253*  --   --   --   --   --  133* 153*  BUN  --  29*  --   --   --   --   --  15 16  CREATININE  --  1.13  --   --   --   --   --  0.66 0.67  CALCIUM  --  9.7  --   --   --   --   --  8.8* 8.6*  MG 1.3*  --   --   --   --   --  1.1* 1.2* 1.8  PHOS  --   --   --   --   --   --   --  3.1 2.1*   < > = values in this interval not displayed.    GFR: Estimated Creatinine Clearance: 76.5 mL/min (by C-G formula based on SCr of 0.67 mg/dL).  Liver Function Tests: Recent Labs  Lab 01/22/23 1743 01/24/23 0608 01/25/23 0233  AST 66* 38 43*  ALT 24 18 17   ALKPHOS 80 57 61  BILITOT 1.5* 1.2 0.6  PROT 7.6 6.0* 6.0*  ALBUMIN 3.4* 2.3* 2.3*   Recent Labs  Lab 01/22/23 1632  LIPASE 156*   Recent Labs  Lab 01/22/23 1633  AMMONIA 35    Coagulation Profile: Recent Labs  Lab 01/22/23 1632  INR 1.2    Cardiac Enzymes: Recent Labs  Lab 01/23/23 0104  CKTOTAL 385    BNP (last 3 results) No results for input(s): "PROBNP" in the last 8760  hours.  Lipid Profile: No results for input(s): "CHOL", "HDL", "LDLCALC", "TRIG", "CHOLHDL", "LDLDIRECT" in the last 72 hours.  Thyroid Function Tests: No results for input(s): "TSH", "T4TOTAL", "FREET4", "T3FREE", "THYROIDAB" in the last 72 hours.  Anemia Panel: No results for input(s): "VITAMINB12", "FOLATE", "FERRITIN", "TIBC", "IRON", "RETICCTPCT" in the last 72 hours.  Urine analysis:    Component Value Date/Time   COLORURINE AMBER (A) 01/22/2023 1633   APPEARANCEUR HAZY (A) 01/22/2023 1633   LABSPEC 1.021 01/22/2023 1633   PHURINE 5.0 01/22/2023 1633   GLUCOSEU 50 (A) 01/22/2023 1633   HGBUR MODERATE (A) 01/22/2023 1633   BILIRUBINUR NEGATIVE 01/22/2023 1633   KETONESUR NEGATIVE 01/22/2023 1633   PROTEINUR 100 (A) 01/22/2023 1633   NITRITE NEGATIVE 01/22/2023 1633   LEUKOCYTESUR TRACE (A) 01/22/2023 1633    Sepsis Labs: Lactic Acid, Venous    Component Value Date/Time   LATICACIDVEN 1.4 01/23/2023 0333    MICROBIOLOGY: Recent Results (from the past 240 hour(s))  Urine Culture (for pregnant, neutropenic or urologic patients or patients with an indwelling urinary catheter)     Status: None (Preliminary result)   Collection Time: 01/22/23 10:42 PM   Specimen: Urine, Clean Catch  Result Value Ref Range Status   Specimen Description URINE, CLEAN CATCH  Final   Special Requests NONE  Final   Culture   Final    CULTURE REINCUBATED FOR BETTER GROWTH Performed at [facility], [address] [place], [name] [address]    Report Status PENDING  Incomplete  Culture, blood (Routine X 2) w Reflex to ID Panel     Status: None (Preliminary result)  Collection Time: 01/23/23  8:20 AM   Specimen: BLOOD  Result Value Ref Range Status   Specimen Description BLOOD LEFT ANTECUBITAL  Final   Special Requests   Final    BOTTLES DRAWN AEROBIC AND ANAEROBIC Blood Culture adequate volume   Culture  Setup Time   Final    GRAM POSITIVE COCCI IN PAIRS IN CHAINS ANAEROBIC  BOTTLE ONLY CRITICAL VALUE NOTED.  VALUE IS CONSISTENT WITH PREVIOUSLY REPORTED AND CALLED VALUE.    Culture   Final    GRAM POSITIVE COCCI IDENTIFICATION TO FOLLOW Performed at [facility], [address], [place], [name] [address]    Report Status PENDING  Incomplete  Culture, blood (Routine X 2) w Reflex to ID Panel     Status: Abnormal (Preliminary result)   Collection Time: 01/23/23  8:20 AM   Specimen: BLOOD  Result Value Ref Range Status   Specimen Description BLOOD RIGHT ANTECUBITAL  Final   Special Requests   Final    BOTTLES DRAWN AEROBIC AND ANAEROBIC Blood Culture results may not be optimal due to an excessive volume of blood received in culture bottles   Culture  Setup Time   Final    GRAM POSITIVE COCCI IN CHAINS IN PAIRS IN BOTH AEROBIC AND ANAEROBIC BOTTLES CRITICAL RESULT CALLED TO, READ BACK BY AND VERIFIED WITH: PHARMD G. ABBOTT 01/24/23 @ 0527 BY AB    Culture (A)  Final    ENTEROCOCCUS FAECALIS SUSCEPTIBILITIES TO FOLLOW Performed at [facility], [address] [place], [name] [address]    Report Status PENDING  Incomplete  Blood Culture ID Panel (Reflexed)     Status: Abnormal   Collection Time: 01/23/23  8:20 AM  Result Value Ref Range Status   Enterococcus faecalis DETECTED (A) NOT DETECTED Final    Comment: CRITICAL RESULT CALLED TO, READ BACK BY AND VERIFIED WITH: PHARMD G. ABBOTT 01/24/23 @ 0527 BY AB    Enterococcus Faecium NOT DETECTED NOT DETECTED Final   Listeria monocytogenes NOT DETECTED NOT DETECTED Final   Staphylococcus species NOT DETECTED NOT DETECTED Final   Staphylococcus aureus (BCID) NOT DETECTED NOT DETECTED Final   Staphylococcus epidermidis NOT DETECTED NOT DETECTED Final   Staphylococcus lugdunensis NOT DETECTED NOT DETECTED Final   Streptococcus species NOT DETECTED NOT DETECTED Final   Streptococcus agalactiae NOT DETECTED NOT DETECTED Final   Streptococcus pneumoniae NOT DETECTED NOT DETECTED Final    Streptococcus pyogenes NOT DETECTED NOT DETECTED Final   A.calcoaceticus-baumannii NOT DETECTED NOT DETECTED Final   Bacteroides fragilis NOT DETECTED NOT DETECTED Final   Enterobacterales NOT DETECTED NOT DETECTED Final   Enterobacter cloacae complex NOT DETECTED NOT DETECTED Final   Escherichia coli NOT DETECTED NOT DETECTED Final   Klebsiella aerogenes NOT DETECTED NOT DETECTED Final   Klebsiella oxytoca NOT DETECTED NOT DETECTED Final   Klebsiella pneumoniae NOT DETECTED NOT DETECTED Final   Proteus species NOT DETECTED NOT DETECTED Final   Salmonella species NOT DETECTED NOT DETECTED Final   Serratia marcescens NOT DETECTED NOT DETECTED Final   Haemophilus influenzae NOT DETECTED NOT DETECTED Final   Neisseria meningitidis NOT DETECTED NOT DETECTED Final   Pseudomonas aeruginosa NOT DETECTED NOT DETECTED Final   Stenotrophomonas maltophilia NOT DETECTED NOT DETECTED Final   Candida albicans NOT DETECTED NOT DETECTED Final   Candida auris NOT DETECTED NOT DETECTED Final   Candida glabrata NOT DETECTED NOT DETECTED Final   Candida krusei NOT DETECTED NOT DETECTED Final   Candida parapsilosis NOT  DETECTED NOT DETECTED Final   Candida tropicalis NOT DETECTED NOT DETECTED Final   Cryptococcus neoformans/gattii NOT DETECTED NOT DETECTED Final   Vancomycin resistance NOT DETECTED NOT DETECTED Final    Comment: Performed at [facility], [address], [place], [name] [address]  Gastrointestinal Panel by PCR , Stool     Status: Abnormal   Collection Time: 01/24/23  6:17 PM   Specimen: Stool  Result Value Ref Range Status   Campylobacter species NOT DETECTED NOT DETECTED Final   Plesimonas shigelloides NOT DETECTED NOT DETECTED Final   Salmonella species NOT DETECTED NOT DETECTED Final   Yersinia enterocolitica NOT DETECTED NOT DETECTED Final   Vibrio species NOT DETECTED NOT DETECTED Final   Vibrio cholerae NOT DETECTED NOT DETECTED Final   Enteroaggregative E coli (EAEC) NOT  DETECTED NOT DETECTED Final   Enteropathogenic E coli (EPEC) DETECTED (A) NOT DETECTED Final    Comment: RESULT CALLED TO, READ BACK BY AND VERIFIED WITH: DESIREE GRIFFITH 01/25/23 1001 KLW    Enterotoxigenic E coli (ETEC) NOT DETECTED NOT DETECTED Final   Shiga like toxin producing E coli (STEC) NOT DETECTED NOT DETECTED Final   Shigella/Enteroinvasive E coli (EIEC) NOT DETECTED NOT DETECTED Final   Cryptosporidium NOT DETECTED NOT DETECTED Final   Cyclospora cayetanensis NOT DETECTED NOT DETECTED Final   Entamoeba histolytica NOT DETECTED NOT DETECTED Final   Giardia lamblia NOT DETECTED NOT DETECTED Final   Adenovirus F40/41 NOT DETECTED NOT DETECTED Final   Astrovirus NOT DETECTED NOT DETECTED Final   Norovirus GI/GII NOT DETECTED NOT DETECTED Final   Rotavirus A NOT DETECTED NOT DETECTED Final   Sapovirus (I, II, IV, and V) NOT DETECTED NOT DETECTED Final    Comment: Performed at [facility], [address] [place], [name] [address]  C Difficile Quick Screen w PCR reflex     Status: Abnormal   Collection Time: 01/24/23  6:41 PM   Specimen: STOOL  Result Value Ref Range Status   C Diff antigen POSITIVE (A) NEGATIVE Final   C Diff toxin NEGATIVE NEGATIVE Final   C Diff interpretation Results are indeterminate. See PCR results.  Final    Comment: Performed at [facility], [address], [place], [name] [address]  C. Diff by PCR, Reflexed     Status: Abnormal   Collection Time: 01/24/23  6:41 PM  Result Value Ref Range Status   Toxigenic C. Difficile by PCR POSITIVE (A) NEGATIVE Final    Comment: Positive for toxigenic C. difficile with little to no toxin production. Only treat if clinical presentation suggests symptomatic illness. Performed at [facility], [address], [place], [name] [address]     RADIOLOGY STUDIES/RESULTS: ECHOCARDIOGRAM COMPLETE  Result Date: 01/25/2023    ECHOCARDIOGRAM REPORT   Patient Name:   Richard Andrews Date of Exam:  01/25/2023 Medical Rec #:  [id number]     Height:       71.0 in Accession #:    [id number]    Weight:       126.0 lb Date of Birth:  12-21-1960     BSA:          1.733 m Patient Age:    62 years      BP:           139/82 mmHg Patient Gender: M             HR:  61 bpm. Exam Location:  Inpatient Procedure: 2D Echo, Cardiac Doppler and Color Doppler Indications:    Bacteremia  History:        Patient has prior history of Echocardiogram examinations, most                 recent 01/19/2021. SDH, hx ICH; Risk Factors:Hypertension, Current                 Smoker and alcohol abuse.  Sonographer:    Wallie Char Referring Phys: VAN DAM, CORNELIUS, N IMPRESSIONS  1. Left ventricular ejection fraction, by estimation, is 50 to 55%. The left ventricle has low normal function. The left ventricle has no regional wall motion abnormalities. Left ventricular diastolic parameters were normal.  2. Right ventricular systolic function is normal. The right ventricular size is normal.  3. The mitral valve is normal in structure. Trivial mitral valve regurgitation. No evidence of mitral stenosis.  4. The aortic valve is tricuspid. Aortic valve regurgitation is not visualized. No aortic stenosis is present.  5. The inferior vena cava is normal in size with greater than 50% respiratory variability, suggesting right atrial pressure of 3 mmHg. FINDINGS  Left Ventricle: Left ventricular ejection fraction, by estimation, is 50 to 55%. The left ventricle has low normal function. The left ventricle has no regional wall motion abnormalities. The left ventricular internal cavity size was normal in size. There is no left ventricular hypertrophy. Left ventricular diastolic parameters were normal. Right Ventricle: The right ventricular size is normal. Right ventricular systolic function is normal. Left Atrium: Left atrial size was normal in size. Right Atrium: Right atrial size was normal in size. Pericardium: There is no evidence of pericardial  effusion. Mitral Valve: The mitral valve is normal in structure. Trivial mitral valve regurgitation. No evidence of mitral valve stenosis. MV peak gradient, 2.5 mmHg. The mean mitral valve gradient is 1.0 mmHg. Tricuspid Valve: The tricuspid valve is normal in structure. Tricuspid valve regurgitation is trivial. No evidence of tricuspid stenosis. Aortic Valve: The aortic valve is tricuspid. Aortic valve regurgitation is not visualized. No aortic stenosis is present. Aortic valve mean gradient measures 3.5 mmHg. Aortic valve peak gradient measures 5.9 mmHg. Aortic valve area, by VTI measures 1.99 cm. Pulmonic Valve: The pulmonic valve was not well visualized. Pulmonic valve regurgitation is not visualized. No evidence of pulmonic stenosis. Aorta: The aortic root is normal in size and structure. Venous: The inferior vena cava is normal in size with greater than 50% respiratory variability, suggesting right atrial pressure of 3 mmHg. IAS/Shunts: No atrial level shunt detected by color flow Doppler.  LEFT VENTRICLE PLAX 2D LVIDd:         5.30 cm     Diastology LVIDs:         3.80 cm     LV e' medial:    8.66 cm/s LV PW:         0.90 cm     LV E/e' medial:  7.2 LV IVS:        0.70 cm     LV e' lateral:   9.52 cm/s LVOT diam:     1.80 cm     LV E/e' lateral: 6.6 LV SV:         51 LV SV Index:   30 LVOT Area:     2.54 cm  LV Volumes (MOD) LV vol d, MOD A2C: 62.4 ml LV vol d, MOD A4C: 91.4 ml LV vol s, MOD A2C: 29.9 ml  LV vol s, MOD A4C: 43.3 ml LV SV MOD A2C:     32.5 ml LV SV MOD A4C:     91.4 ml LV SV MOD BP:      40.0 ml RIGHT VENTRICLE          IVC RV Basal diam:  3.00 cm  IVC diam: 2.40 cm TAPSE (M-mode): 2.4 cm LEFT ATRIUM             Index        RIGHT ATRIUM           Index LA diam:        3.80 cm 2.19 cm/m   RA Area:     15.20 cm LA Vol (A2C):   35.5 ml 20.49 ml/m  RA Volume:   38.90 ml  22.45 ml/m LA Vol (A4C):   30.3 ml 17.49 ml/m LA Biplane Vol: 34.1 ml 19.68 ml/m  AORTIC VALVE AV Area (Vmax):    1.99  cm AV Area (Vmean):   1.97 cm AV Area (VTI):     1.99 cm AV Vmax:           121.00 cm/s AV Vmean:          86.250 cm/s AV VTI:            0.257 m AV Peak Grad:      5.9 mmHg AV Mean Grad:      3.5 mmHg LVOT Vmax:         94.80 cm/s LVOT Vmean:        66.700 cm/s LVOT VTI:          0.201 m LVOT/AV VTI ratio: 0.78  AORTA Ao Root diam: 3.40 cm Ao Asc diam:  3.70 cm MITRAL VALVE               TRICUSPID VALVE MV Area (PHT): 3.56 cm    TR Peak grad:   27.0 mmHg MV Area VTI:   1.99 cm    TR Vmax:        260.00 cm/s MV Peak grad:  2.5 mmHg MV Mean grad:  1.0 mmHg    SHUNTS MV Vmax:       0.79 m/s    Systemic VTI:  0.20 m MV Vmean:      46.4 cm/s   Systemic Diam: 1.80 cm MV Decel Time: 213 msec MV E velocity: 62.60 cm/s MV A velocity: 77.20 cm/s MV E/A ratio:  0.81 Olga Millers MD Electronically signed by Olga Millers MD Signature Date/Time: 01/25/2023/11:21:22 AM    Final    DG C-Arm 1-60 Min  Result Date: 01/23/2023 CLINICAL DATA:  Cystography. EXAM: DG C-ARM 1-60 MIN CONTRAST:  None. FLUOROSCOPY: Fluoroscopy Time:  47.7 seconds Radiation Exposure Index (if provided by the fluoroscopic device): 4.9167 mGy Number of Acquired Spot Images: 1 COMPARISON:  None Available. FINDINGS: A single image demonstrates a left ureteral stent. Please see operative report for additional information. IMPRESSION: Intraoperative utilization of fluoroscopy. Electronically Signed   By: Thornell Sartorius M.D.   On: 01/23/2023 22:30     LOS: 3 days   Jeoffrey Massed, MD  [facility]    To contact the attending provider between 7A-7P or the covering provider during after hours 7P-7A, please log into the web site [email or web address] and access using universal [place] password for that web site. If you do not have the password, please call the hospital operator.  01/25/2023, 12:35 PM
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
## 2023-01-25 NOTE — Plan of Care (Signed)
Problem: Education: Goal: Knowledge of General Education information will improve Description: Including pain rating scale, medication(s)/side effects and non-pharmacologic comfort measures 01/25/2023 1949 by Salome Spotted, RN Outcome: Progressing 01/25/2023 0628 by Salome Spotted, RN Outcome: Progressing   Problem: Health Behavior/Discharge Planning: Goal: Ability to manage health-related needs will improve 01/25/2023 1949 by Salome Spotted, RN Outcome: Progressing 01/25/2023 0628 by Salome Spotted, RN Outcome: Progressing   Problem: Clinical Measurements: Goal: Ability to maintain clinical measurements within normal limits will improve 01/25/2023 1949 by Salome Spotted, RN Outcome: Progressing 01/25/2023 0628 by Salome Spotted, RN Outcome: Progressing Goal: Will remain free from infection 01/25/2023 1949 by Salome Spotted, RN Outcome: Progressing 01/25/2023 0628 by Salome Spotted, RN Outcome: Progressing Goal: Diagnostic test results will improve 01/25/2023 1949 by Salome Spotted, RN Outcome: Progressing 01/25/2023 0628 by Salome Spotted, RN Outcome: Progressing Goal: Respiratory complications will improve 01/25/2023 1949 by Salome Spotted, RN Outcome: Progressing 01/25/2023 0628 by Salome Spotted, RN Outcome: Progressing Goal: Cardiovascular complication will be avoided 01/25/2023 1949 by Salome Spotted, RN Outcome: Progressing 01/25/2023 0628 by Salome Spotted, RN Outcome: Progressing   Problem: Activity: Goal: Risk for activity intolerance will decrease 01/25/2023 1949 by Salome Spotted, RN Outcome: Progressing 01/25/2023 0628 by Salome Spotted, RN Outcome: Progressing   Problem: Nutrition: Goal: Adequate nutrition will be maintained 01/25/2023 1949 by Salome Spotted, RN Outcome: Progressing 01/25/2023 0628 by Salome Spotted, RN Outcome: Progressing   Problem: Coping: Goal: Level of anxiety will decrease 01/25/2023 1949 by Salome Spotted, RN Outcome: Progressing 01/25/2023 0628 by Salome Spotted,  RN Outcome: Progressing   Problem: Elimination: Goal: Will not experience complications related to bowel motility 01/25/2023 1949 by Salome Spotted, RN Outcome: Progressing 01/25/2023 0628 by Salome Spotted, RN Outcome: Progressing Goal: Will not experience complications related to urinary retention 01/25/2023 1949 by Salome Spotted, RN Outcome: Progressing 01/25/2023 0628 by Salome Spotted, RN Outcome: Progressing   Problem: Pain Managment: Goal: General experience of comfort will improve 01/25/2023 1949 by Salome Spotted, RN Outcome: Progressing 01/25/2023 0628 by Salome Spotted, RN Outcome: Progressing   Problem: Safety: Goal: Ability to remain free from injury will improve 01/25/2023 1949 by Salome Spotted, RN Outcome: Progressing 01/25/2023 0628 by Salome Spotted, RN Outcome: Progressing   Problem: Skin Integrity: Goal: Risk for impaired skin integrity will decrease 01/25/2023 1949 by Salome Spotted, RN Outcome: Progressing 01/25/2023 0628 by Salome Spotted, RN Outcome: Progressing   Problem: Education: Goal: Knowledge of disease or condition will improve 01/25/2023 1949 by Salome Spotted, RN Outcome: Progressing 01/25/2023 0628 by Salome Spotted, RN Outcome: Progressing Goal: Understanding of discharge needs will improve 01/25/2023 1949 by Salome Spotted, RN Outcome: Progressing 01/25/2023 0628 by Salome Spotted, RN Outcome: Progressing   Problem: Health Behavior/Discharge Planning: Goal: Ability to identify changes in lifestyle to reduce recurrence of condition will improve 01/25/2023 1949 by Salome Spotted, RN Outcome: Progressing 01/25/2023 0628 by Salome Spotted, RN Outcome: Progressing Goal: Identification of resources available to assist in meeting health care needs will improve 01/25/2023 1949 by Salome Spotted, RN Outcome: Progressing 01/25/2023 0628 by Salome Spotted, RN Outcome: Progressing   Problem: Physical Regulation: Goal: Complications related to the disease process, condition or  treatment will be avoided or minimized 01/25/2023 1949 by Salome Spotted, RN Outcome: Progressing 01/25/2023 0628 by Salome Spotted, RN Outcome: Progressing  Problem: Safety: Goal: Ability to remain free from injury will improve 01/25/2023 1949 by Salome Spotted, RN Outcome: Progressing 01/25/2023 0628 by Salome Spotted, RN Outcome: Progressing   Problem: Education: Goal: Knowledge of General Education information will improve Description: Including pain rating scale, medication(s)/side effects and non-pharmacologic comfort measures 01/25/2023 1949 by Salome Spotted, RN Outcome: Progressing 01/25/2023 0628 by Salome Spotted, RN Outcome: Progressing   Problem: Health Behavior/Discharge Planning: Goal: Ability to manage health-related needs will improve 01/25/2023 1949 by Salome Spotted, RN Outcome: Progressing 01/25/2023 0628 by Salome Spotted, RN Outcome: Progressing   Problem: Clinical Measurements: Goal: Ability to maintain clinical measurements within normal limits will improve 01/25/2023 1949 by Salome Spotted, RN Outcome: Progressing 01/25/2023 0628 by Salome Spotted, RN Outcome: Progressing   Problem: Clinical Measurements: Goal: Will remain free from infection 01/25/2023 1949 by Salome Spotted, RN Outcome: Progressing 01/25/2023 0628 by Salome Spotted, RN Outcome: Progressing   Problem: Clinical Measurements: Goal: Diagnostic test results will improve 01/25/2023 1949 by Salome Spotted, RN Outcome: Progressing 01/25/2023 0628 by Salome Spotted, RN Outcome: Progressing   Problem: Clinical Measurements: Goal: Respiratory complications will improve 01/25/2023 1949 by Salome Spotted, RN Outcome: Progressing 01/25/2023 0628 by Salome Spotted, RN Outcome: Progressing   Problem: Clinical Measurements: Goal: Cardiovascular complication will be avoided 01/25/2023 1949 by Salome Spotted, RN Outcome: Progressing 01/25/2023 0628 by Salome Spotted, RN Outcome: Progressing   Problem: Activity: Goal: Risk for  activity intolerance will decrease 01/25/2023 1949 by Salome Spotted, RN Outcome: Progressing 01/25/2023 0628 by Salome Spotted, RN Outcome: Progressing   Problem: Nutrition: Goal: Adequate nutrition will be maintained 01/25/2023 1949 by Salome Spotted, RN Outcome: Progressing 01/25/2023 0628 by Salome Spotted, RN Outcome: Progressing   Problem: Coping: Goal: Level of anxiety will decrease 01/25/2023 1949 by Salome Spotted, RN Outcome: Progressing 01/25/2023 0628 by Salome Spotted, RN Outcome: Progressing   Problem: Elimination: Goal: Will not experience complications related to bowel motility 01/25/2023 1949 by Salome Spotted, RN Outcome: Progressing 01/25/2023 0628 by Salome Spotted, RN Outcome: Progressing   Problem: Elimination: Goal: Will not experience complications related to bowel motility 01/25/2023 1949 by Salome Spotted, RN Outcome: Progressing   Problem: Elimination: Goal: Will not experience complications related to urinary retention 01/25/2023 1949 by Salome Spotted, RN Outcome: Progressing 01/25/2023 0628 by Salome Spotted, RN Outcome: Progressing   Problem: Pain Managment: Goal: General experience of comfort will improve 01/25/2023 1949 by Salome Spotted, RN Outcome: Progressing 01/25/2023 0628 by Salome Spotted, RN Outcome: Progressing   Problem: Safety: Goal: Ability to remain free from injury will improve 01/25/2023 1949 by Salome Spotted, RN Outcome: Progressing 01/25/2023 0628 by Salome Spotted, RN Outcome: Progressing   Problem: Safety: Goal: Ability to remain free from injury will improve 01/25/2023 1949 by Salome Spotted, RN Outcome: Progressing   Problem: Skin Integrity: Goal: Risk for impaired skin integrity will decrease 01/25/2023 1949 by Salome Spotted, RN Outcome: Progressing   Problem: Skin Integrity: Goal: Risk for impaired skin integrity will decrease 01/25/2023 1949 by Salome Spotted, RN Outcome: Progressing 01/25/2023 0628 by Salome Spotted, RN Outcome: Progressing    Problem: Education: Goal: Knowledge of disease or condition will improve 01/25/2023 1949 by Salome Spotted, RN Outcome: Progressing 01/25/2023 0628 by Salome Spotted, RN Outcome: Progressing   Problem: Education: Goal: Understanding of discharge needs will improve 01/25/2023  1949 by Salome Spotted, RN Outcome: Progressing 01/25/2023 0628 by Salome Spotted, RN Outcome: Progressing   Problem: Health Behavior/Discharge Planning: Goal: Identification of resources available to assist in meeting health care needs will improve 01/25/2023 1949 by Salome Spotted, RN Outcome: Progressing 01/25/2023 0628 by Salome Spotted, RN Outcome: Progressing   Problem: Physical Regulation: Goal: Complications related to the disease process, condition or treatment will be avoided or minimized 01/25/2023 1949 by Salome Spotted, RN Outcome: Progressing 01/25/2023 0628 by Salome Spotted, RN Outcome: Progressing   Problem: Safety: Goal: Ability to remain free from injury will improve 01/25/2023 1949 by Salome Spotted, RN Outcome: Progressing 01/25/2023 0628 by Salome Spotted, RN Outcome: Progressing   Problem: Safety: Goal: Ability to remain free from injury will improve 01/25/2023 1949 by Salome Spotted, RN Outcome: Progressing

## 2023-01-25 NOTE — Plan of Care (Signed)
Problem: Education: Goal: Knowledge of General Education information will improve Description: Including pain rating scale, medication(s)/side effects and non-pharmacologic comfort measures Outcome: Progressing   Problem: Health Behavior/Discharge Planning: Goal: Ability to manage health-related needs will improve Outcome: Progressing   Problem: Clinical Measurements: Goal: Ability to maintain clinical measurements within normal limits will improve Outcome: Progressing Goal: Will remain free from infection Outcome: Progressing Goal: Diagnostic test results will improve Outcome: Progressing Goal: Respiratory complications will improve Outcome: Progressing Goal: Cardiovascular complication will be avoided Outcome: Progressing   Problem: Activity: Goal: Risk for activity intolerance will decrease Outcome: Progressing   Problem: Nutrition: Goal: Adequate nutrition will be maintained Outcome: Progressing   Problem: Coping: Goal: Level of anxiety will decrease Outcome: Progressing   Problem: Elimination: Goal: Will not experience complications related to bowel motility Outcome: Progressing Goal: Will not experience complications related to urinary retention Outcome: Progressing   Problem: Pain Managment: Goal: General experience of comfort will improve Outcome: Progressing   Problem: Safety: Goal: Ability to remain free from injury will improve Outcome: Progressing   Problem: Skin Integrity: Goal: Risk for impaired skin integrity will decrease Outcome: Progressing   Problem: Education: Goal: Knowledge of disease or condition will improve Outcome: Progressing Goal: Understanding of discharge needs will improve Outcome: Progressing   Problem: Health Behavior/Discharge Planning: Goal: Ability to identify changes in lifestyle to reduce recurrence of condition will improve Outcome: Progressing Goal: Identification of resources available to assist in meeting health  care needs will improve Outcome: Progressing   Problem: Physical Regulation: Goal: Complications related to the disease process, condition or treatment will be avoided or minimized Outcome: Progressing   Problem: Safety: Goal: Ability to remain free from injury will improve Outcome: Progressing   Problem: Education: Goal: Knowledge of General Education information will improve Description: Including pain rating scale, medication(s)/side effects and non-pharmacologic comfort measures Outcome: Progressing   Problem: Health Behavior/Discharge Planning: Goal: Ability to manage health-related needs will improve Outcome: Progressing   Problem: Clinical Measurements: Goal: Ability to maintain clinical measurements within normal limits will improve Outcome: Progressing   Problem: Clinical Measurements: Goal: Will remain free from infection Outcome: Progressing   Problem: Clinical Measurements: Goal: Diagnostic test results will improve Outcome: Progressing   Problem: Clinical Measurements: Goal: Diagnostic test results will improve Outcome: Progressing   Problem: Clinical Measurements: Goal: Respiratory complications will improve Outcome: Progressing   Problem: Clinical Measurements: Goal: Respiratory complications will improve Outcome: Progressing   Problem: Clinical Measurements: Goal: Cardiovascular complication will be avoided Outcome: Progressing   Problem: Coping: Goal: Level of anxiety will decrease Outcome: Progressing   Problem: Elimination: Goal: Will not experience complications related to bowel motility Outcome: Progressing   Problem: Safety: Goal: Ability to remain free from injury will improve Outcome: Progressing   Problem: Pain Managment: Goal: General experience of comfort will improve Outcome: Progressing   Problem: Pain Managment: Goal: General experience of comfort will improve Outcome: Progressing   Problem: Skin Integrity: Goal: Risk  for impaired skin integrity will decrease Outcome: Progressing   Problem: Health Behavior/Discharge Planning: Goal: Ability to identify changes in lifestyle to reduce recurrence of condition will improve Outcome: Progressing   Problem: Education: Goal: Understanding of discharge needs will improve Outcome: Progressing   Problem: Education: Goal: Knowledge of disease or condition will improve Outcome: Progressing   Problem: Health Behavior/Discharge Planning: Goal: Identification of resources available to assist in meeting health care needs will improve Outcome: Progressing   Problem: Safety: Goal: Ability to remain free from injury will improve Outcome:  Progressing   Problem: Physical Regulation: Goal: Complications related to the disease process, condition or treatment will be avoided or minimized Outcome: Progressing   Problem: Physical Regulation: Goal: Complications related to the disease process, condition or treatment will be avoided or minimized Outcome: Progressing

## 2023-01-25 NOTE — Consult Note (Signed)
WOC Nurse Consult Note: Reason for Consult: Severe irritant contact dermatitis related to fecal incontinence involving scrotum, inguinal and perineal areas. Partial thickness stage 2 pressure injury to left sacrum, non pressure wound to left medial thigh, chronic, nonhealing Wound type:irritant contact dermatitis, pressure, full thickness  ICD-10 CM Codes for Irritant Dermatitis  L24A2 - Due to fecal, urinary or dual incontinence L24A9 - Due to friction or contact with other specified body fluids L30.4  - Erythema intertrigo. Also used for abrasion of the hand, chafing of the skin, dermatitis due to sweating and friction, friction dermatitis, friction eczema, and genital/thigh intertrigo.   Pressure Injury POA: Yes Measurement: Left sacrum Stage 2 PI:  4cm x 2.5cm x 0.1cm pink, moist with surrounding abrasion. Scant serous exudate Left medial thigh Unstageable PI: 2x2 with eschar and no exudate Wound bed:As noted above Drainage (amount, consistency, odor) As noted above Periwound: intact, dry Dressing procedure/placement/frequency: A mattress replacement with low air loss feature will be provided for microclimate management and to assist with resolution of the ICD. Turning and repositioning is in place, time in the supine position is to be minimized. A pressure redistribution chair cushion is also provided for use both while in house and post discharge. Additionally, I have increased the frequency of the application of Gerhart's Butt cream (a prescriptive 1:1:1 preparation of zinc oxide, hydrocortisone and lotrimin creams) to 4 times daily and PRN. Post discharge, Desitin cream can be used.  WOC nursing team will not follow, but will remain available to this patient, the nursing and medical teams.  Please re-consult if needed.  Thank you for inviting [place] to participate in this patient's Plan of Care.  Ladona Mow, MSN, RN, CNS, GNP, Leda Min, [employer], [employer] phone:  [phone]

## 2023-01-25 NOTE — TOC CM/SW Note (Signed)
Transition of Care [facility]) - Inpatient Brief Assessment   Patient Details  Name: Richard Andrews MRN: [id number] Date of Birth: November 29, 1960  Transition of Care [facility]) CM/SW Contact:    Mearl Latin, LCSW Phone Number: 01/25/2023, 9:27 AM   Clinical Narrative: Patient admitted from home. He has completed two CIR stays during his last two hospitalizations this year. No current TOC needs identified but please place consult if needed.    Transition of Care Asessment: Insurance and Status: Insurance coverage has been reviewed Patient has primary care physician: Yes Home environment has been reviewed: From home Prior level of function:: Independent Prior/Current Home Services: No current home services Social Determinants of Health Reivew: SDOH reviewed no interventions necessary Readmission risk has been reviewed: Yes Transition of care needs: no transition of care needs at this time

## 2023-01-25 NOTE — Progress Notes (Addendum)
Patient ID: Richard Andrews, male   DOB: [birth year], 62 y.o.   MRN: [id number] Patient is alert and coherent today.  Speech is also much more comprehensible.  At this point his next follow up study can be elective, based on symptoms... we will sign off, please reconsult as needed.

## 2023-01-25 NOTE — Progress Notes (Signed)
Subjective: No new complaints   Antibiotics:  Anti-infectives (From admission, onward)    Start     Dose/Rate Route Frequency Ordered Stop   01/24/23 0800  vancomycin (VANCOCIN) IVPB 1000 mg/200 mL premix  Status:  Discontinued        1,000 mg 200 mL/hr over 60 Minutes Intravenous Every 24 hours 01/23/23 0847 01/24/23 0537   01/24/23 0800  ampicillin (OMNIPEN) 2 g in sodium chloride 0.9 % 100 mL IVPB        2 g 300 mL/hr over 20 Minutes Intravenous Every 4 hours 01/24/23 0537     01/23/23 1600  piperacillin-tazobactam (ZOSYN) IVPB 3.375 g  Status:  Discontinued        3.375 g 12.5 mL/hr over 240 Minutes Intravenous Every 8 hours 01/23/23 0847 01/24/23 0537   01/23/23 0845  piperacillin-tazobactam (ZOSYN) IVPB 3.375 g        3.375 g 100 mL/hr over 30 Minutes Intravenous  Once 01/23/23 0842 01/23/23 0940   01/23/23 0845  vancomycin (VANCOCIN) IVPB 1000 mg/200 mL premix        1,000 mg 200 mL/hr over 60 Minutes Intravenous  Once 01/23/23 0842 01/23/23 1046       Medications: Scheduled Meds:  DULoxetine  30 mg Oral Daily   enoxaparin (LOVENOX) injection  40 mg Subcutaneous Q24H   Gerhardt's butt cream   Topical QID   LORazepam  0-4 mg Intravenous Q12H   Or   LORazepam  0-4 mg Oral Q12H   pantoprazole  40 mg Oral Daily   thiamine  100 mg Oral Daily   Or   thiamine  100 mg Intravenous Daily   Continuous Infusions:  sodium chloride 50 mL/hr at 01/24/23 1401   ampicillin (OMNIPEN) IV 2 g (01/25/23 1727)   PRN Meds:.acetaminophen **OR** acetaminophen, HYDROcodone-acetaminophen    Objective: Weight change:   Intake/Output Summary (Last 24 hours) at 01/25/2023 1904 Last data filed at 01/25/2023 1437 Gross per 24 hour  Intake 480 ml  Output --  Net 480 ml   Blood pressure 126/84, pulse 71, temperature 98.8 F (37.1 C), temperature source Oral, resp. rate 17, height 5\' 11"  (1.803 m), weight 57.2 kg, SpO2 97%. Temp:  [97.8 F (36.6 C)-98.8 F (37.1 C)] 98.8  F (37.1 C) (08/14 1711) Pulse Rate:  [63-71] 71 (08/14 1711) Resp:  [12-17] 17 (08/14 1711) BP: (126-140)/(82-88) 126/84 (08/14 1711) SpO2:  [97 %-100 %] 97 % (08/14 1711)  Physical Exam: Physical Exam Constitutional:      Appearance: He is underweight. He is ill-appearing.  HENT:     Head: Normocephalic and atraumatic.  Cardiovascular:     Rate and Rhythm: Tachycardia present.  Pulmonary:     Effort: No respiratory distress.     Breath sounds: No wheezing.  Abdominal:     General: There is no distension.  Musculoskeletal:        General: Normal range of motion.  Skin:    General: Skin is warm and dry.     Coloration: Skin is pale.  Neurological:     General: No focal deficit present.     Mental Status: He is alert.  Psychiatric:        Mood and Affect: Mood normal.        Behavior: Behavior normal.        Thought Content: Thought content normal.      CBC:    BMET Recent Labs    01/24/23 0608 01/25/23  0233  NA 133* 134*  K 3.2* 3.0*  CL 98 101  CO2 23 24  GLUCOSE 133* 153*  BUN 15 16  CREATININE 0.66 0.67  CALCIUM 8.8* 8.6*     Liver Panel  Recent Labs    01/24/23 0608 01/25/23 0233  PROT 6.0* 6.0*  ALBUMIN 2.3* 2.3*  AST 38 43*  ALT 18 17  ALKPHOS 57 61  BILITOT 1.2 0.6       Sedimentation Rate No results for input(s): "ESRSEDRATE" in the last 72 hours. C-Reactive Protein No results for input(s): "CRP" in the last 72 hours.  Micro Results: Recent Results (from the past 720 hour(s))  Urine Culture (for pregnant, neutropenic or urologic patients or patients with an indwelling urinary catheter)     Status: None (Preliminary result)   Collection Time: 01/22/23 10:42 PM   Specimen: Urine, Clean Catch  Result Value Ref Range Status   Specimen Description URINE, CLEAN CATCH  Final   Special Requests NONE  Final   Culture   Final    CULTURE REINCUBATED FOR BETTER GROWTH Performed at [facility], [address], [place],  [name] [address]    Report Status PENDING  Incomplete  Culture, blood (Routine X 2) w Reflex to ID Panel     Status: None (Preliminary result)   Collection Time: 01/23/23  8:20 AM   Specimen: BLOOD  Result Value Ref Range Status   Specimen Description BLOOD LEFT ANTECUBITAL  Final   Special Requests   Final    BOTTLES DRAWN AEROBIC AND ANAEROBIC Blood Culture adequate volume   Culture  Setup Time   Final    GRAM POSITIVE COCCI IN PAIRS IN CHAINS ANAEROBIC BOTTLE ONLY CRITICAL VALUE NOTED.  VALUE IS CONSISTENT WITH PREVIOUSLY REPORTED AND CALLED VALUE.    Culture   Final    GRAM POSITIVE COCCI IDENTIFICATION TO FOLLOW Performed at [facility], [address], [place], [name] [address]    Report Status PENDING  Incomplete  Culture, blood (Routine X 2) w Reflex to ID Panel     Status: Abnormal (Preliminary result)   Collection Time: 01/23/23  8:20 AM   Specimen: BLOOD  Result Value Ref Range Status   Specimen Description BLOOD RIGHT ANTECUBITAL  Final   Special Requests   Final    BOTTLES DRAWN AEROBIC AND ANAEROBIC Blood Culture results may not be optimal due to an excessive volume of blood received in culture bottles   Culture  Setup Time   Final    GRAM POSITIVE COCCI IN CHAINS IN PAIRS IN BOTH AEROBIC AND ANAEROBIC BOTTLES CRITICAL RESULT CALLED TO, READ BACK BY AND VERIFIED WITH: PHARMD G. ABBOTT 01/24/23 @ 0527 BY AB    Culture (A)  Final    ENTEROCOCCUS FAECALIS SUSCEPTIBILITIES TO FOLLOW Performed at [facility], [address], [place], [name] [address]    Report Status PENDING  Incomplete  Blood Culture ID Panel (Reflexed)     Status: Abnormal   Collection Time: 01/23/23  8:20 AM  Result Value Ref Range Status   Enterococcus faecalis DETECTED (A) NOT DETECTED Final    Comment: CRITICAL RESULT CALLED TO, READ BACK BY AND VERIFIED WITH: PHARMD G. ABBOTT 01/24/23 @ 0527 BY AB    Enterococcus Faecium NOT DETECTED NOT DETECTED Final   Listeria monocytogenes NOT  DETECTED NOT DETECTED Final   Staphylococcus species NOT DETECTED NOT DETECTED Final   Staphylococcus aureus (BCID) NOT DETECTED NOT DETECTED Final  Staphylococcus epidermidis NOT DETECTED NOT DETECTED Final   Staphylococcus lugdunensis NOT DETECTED NOT DETECTED Final   Streptococcus species NOT DETECTED NOT DETECTED Final   Streptococcus agalactiae NOT DETECTED NOT DETECTED Final   Streptococcus pneumoniae NOT DETECTED NOT DETECTED Final   Streptococcus pyogenes NOT DETECTED NOT DETECTED Final   A.calcoaceticus-baumannii NOT DETECTED NOT DETECTED Final   Bacteroides fragilis NOT DETECTED NOT DETECTED Final   Enterobacterales NOT DETECTED NOT DETECTED Final   Enterobacter cloacae complex NOT DETECTED NOT DETECTED Final   Escherichia coli NOT DETECTED NOT DETECTED Final   Klebsiella aerogenes NOT DETECTED NOT DETECTED Final   Klebsiella oxytoca NOT DETECTED NOT DETECTED Final   Klebsiella pneumoniae NOT DETECTED NOT DETECTED Final   Proteus species NOT DETECTED NOT DETECTED Final   Salmonella species NOT DETECTED NOT DETECTED Final   Serratia marcescens NOT DETECTED NOT DETECTED Final   Haemophilus influenzae NOT DETECTED NOT DETECTED Final   Neisseria meningitidis NOT DETECTED NOT DETECTED Final   Pseudomonas aeruginosa NOT DETECTED NOT DETECTED Final   Stenotrophomonas maltophilia NOT DETECTED NOT DETECTED Final   Candida albicans NOT DETECTED NOT DETECTED Final   Candida auris NOT DETECTED NOT DETECTED Final   Candida glabrata NOT DETECTED NOT DETECTED Final   Candida krusei NOT DETECTED NOT DETECTED Final   Candida parapsilosis NOT DETECTED NOT DETECTED Final   Candida tropicalis NOT DETECTED NOT DETECTED Final   Cryptococcus neoformans/gattii NOT DETECTED NOT DETECTED Final   Vancomycin resistance NOT DETECTED NOT DETECTED Final    Comment: Performed at [facility], [address], [place], [name] [address]  Gastrointestinal Panel by PCR , Stool     Status: Abnormal    Collection Time: 01/24/23  6:17 PM   Specimen: Stool  Result Value Ref Range Status   Campylobacter species NOT DETECTED NOT DETECTED Final   Plesimonas shigelloides NOT DETECTED NOT DETECTED Final   Salmonella species NOT DETECTED NOT DETECTED Final   Yersinia enterocolitica NOT DETECTED NOT DETECTED Final   Vibrio species NOT DETECTED NOT DETECTED Final   Vibrio cholerae NOT DETECTED NOT DETECTED Final   Enteroaggregative E coli (EAEC) NOT DETECTED NOT DETECTED Final   Enteropathogenic E coli (EPEC) DETECTED (A) NOT DETECTED Final    Comment: RESULT CALLED TO, READ BACK BY AND VERIFIED WITH: DESIREE GRIFFITH 01/25/23 1001 KLW    Enterotoxigenic E coli (ETEC) NOT DETECTED NOT DETECTED Final   Shiga like toxin producing E coli (STEC) NOT DETECTED NOT DETECTED Final   Shigella/Enteroinvasive E coli (EIEC) NOT DETECTED NOT DETECTED Final   Cryptosporidium NOT DETECTED NOT DETECTED Final   Cyclospora cayetanensis NOT DETECTED NOT DETECTED Final   Entamoeba histolytica NOT DETECTED NOT DETECTED Final   Giardia lamblia NOT DETECTED NOT DETECTED Final   Adenovirus F40/41 NOT DETECTED NOT DETECTED Final   Astrovirus NOT DETECTED NOT DETECTED Final   Norovirus GI/GII NOT DETECTED NOT DETECTED Final   Rotavirus A NOT DETECTED NOT DETECTED Final   Sapovirus (I, II, IV, and V) NOT DETECTED NOT DETECTED Final    Comment: Performed at [facility], [address] [place], [name] [address]  C Difficile Quick Screen w PCR reflex     Status: Abnormal   Collection Time: 01/24/23  6:41 PM   Specimen: STOOL  Result Value Ref Range Status   C Diff antigen POSITIVE (A) NEGATIVE Final   C Diff toxin NEGATIVE NEGATIVE Final   C Diff interpretation Results are indeterminate. See PCR results.  Final  Comment: Performed at [facility], [address], [place], [name] [address]  C. Diff by PCR, Reflexed     Status: Abnormal   Collection Time: 01/24/23  6:41 PM  Result Value Ref Range  Status   Toxigenic C. Difficile by PCR POSITIVE (A) NEGATIVE Final    Comment: Positive for toxigenic C. difficile with little to no toxin production. Only treat if clinical presentation suggests symptomatic illness. Performed at [facility], [address] [place], [name] [address]     Studies/Results: ECHOCARDIOGRAM COMPLETE  Result Date: 01/25/2023    ECHOCARDIOGRAM REPORT   Patient Name:   Richard Andrews Date of Exam: 01/25/2023 Medical Rec #:  [id number]     Height:       71.0 in Accession #:    [id number]    Weight:       126.0 lb Date of Birth:  1960-10-27     BSA:          1.733 m Patient Age:    62 years      BP:           139/82 mmHg Patient Gender: M             HR:           61 bpm. Exam Location:  Inpatient Procedure: 2D Echo, Cardiac Doppler and Color Doppler Indications:    Bacteremia  History:        Patient has prior history of Echocardiogram examinations, most                 recent 01/19/2021. SDH, hx ICH; Risk Factors:Hypertension, Current                 Smoker and alcohol abuse.  Sonographer:    Wallie Char Referring Phys: VAN DAM, , N IMPRESSIONS  1. Left ventricular ejection fraction, by estimation, is 50 to 55%. The left ventricle has low normal function. The left ventricle has no regional wall motion abnormalities. Left ventricular diastolic parameters were normal.  2. Right ventricular systolic function is normal. The right ventricular size is normal.  3. The mitral valve is normal in structure. Trivial mitral valve regurgitation. No evidence of mitral stenosis.  4. The aortic valve is tricuspid. Aortic valve regurgitation is not visualized. No aortic stenosis is present.  5. The inferior vena cava is normal in size with greater than 50% respiratory variability, suggesting right atrial pressure of 3 mmHg. FINDINGS  Left Ventricle: Left ventricular ejection fraction, by estimation, is 50 to 55%. The left ventricle has low normal function. The left ventricle has no  regional wall motion abnormalities. The left ventricular internal cavity size was normal in size. There is no left ventricular hypertrophy. Left ventricular diastolic parameters were normal. Right Ventricle: The right ventricular size is normal. Right ventricular systolic function is normal. Left Atrium: Left atrial size was normal in size. Right Atrium: Right atrial size was normal in size. Pericardium: There is no evidence of pericardial effusion. Mitral Valve: The mitral valve is normal in structure. Trivial mitral valve regurgitation. No evidence of mitral valve stenosis. MV peak gradient, 2.5 mmHg. The mean mitral valve gradient is 1.0 mmHg. Tricuspid Valve: The tricuspid valve is normal in structure. Tricuspid valve regurgitation is trivial. No evidence of tricuspid stenosis. Aortic Valve: The aortic valve is tricuspid. Aortic valve regurgitation is not visualized. No aortic stenosis is present. Aortic valve mean gradient measures 3.5 mmHg. Aortic valve peak gradient measures  5.9 mmHg. Aortic valve area, by VTI measures 1.99 cm. Pulmonic Valve: The pulmonic valve was not well visualized. Pulmonic valve regurgitation is not visualized. No evidence of pulmonic stenosis. Aorta: The aortic root is normal in size and structure. Venous: The inferior vena cava is normal in size with greater than 50% respiratory variability, suggesting right atrial pressure of 3 mmHg. IAS/Shunts: No atrial level shunt detected by color flow Doppler.  LEFT VENTRICLE PLAX 2D LVIDd:         5.30 cm     Diastology LVIDs:         3.80 cm     LV e' medial:    8.66 cm/s LV PW:         0.90 cm     LV E/e' medial:  7.2 LV IVS:        0.70 cm     LV e' lateral:   9.52 cm/s LVOT diam:     1.80 cm     LV E/e' lateral: 6.6 LV SV:         51 LV SV Index:   30 LVOT Area:     2.54 cm  LV Volumes (MOD) LV vol d, MOD A2C: 62.4 ml LV vol d, MOD A4C: 91.4 ml LV vol s, MOD A2C: 29.9 ml LV vol s, MOD A4C: 43.3 ml LV SV MOD A2C:     32.5 ml LV SV MOD A4C:      91.4 ml LV SV MOD BP:      40.0 ml RIGHT VENTRICLE          IVC RV Basal diam:  3.00 cm  IVC diam: 2.40 cm TAPSE (M-mode): 2.4 cm LEFT ATRIUM             Index        RIGHT ATRIUM           Index LA diam:        3.80 cm 2.19 cm/m   RA Area:     15.20 cm LA Vol (A2C):   35.5 ml 20.49 ml/m  RA Volume:   38.90 ml  22.45 ml/m LA Vol (A4C):   30.3 ml 17.49 ml/m LA Biplane Vol: 34.1 ml 19.68 ml/m  AORTIC VALVE AV Area (Vmax):    1.99 cm AV Area (Vmean):   1.97 cm AV Area (VTI):     1.99 cm AV Vmax:           121.00 cm/s AV Vmean:          86.250 cm/s AV VTI:            0.257 m AV Peak Grad:      5.9 mmHg AV Mean Grad:      3.5 mmHg LVOT Vmax:         94.80 cm/s LVOT Vmean:        66.700 cm/s LVOT VTI:          0.201 m LVOT/AV VTI ratio: 0.78  AORTA Ao Root diam: 3.40 cm Ao Asc diam:  3.70 cm MITRAL VALVE               TRICUSPID VALVE MV Area (PHT): 3.56 cm    TR Peak grad:   27.0 mmHg MV Area VTI:   1.99 cm    TR Vmax:        260.00 cm/s MV Peak grad:  2.5 mmHg MV Mean grad:  1.0 mmHg    SHUNTS MV Vmax:  0.79 m/s    Systemic VTI:  0.20 m MV Vmean:      46.4 cm/s   Systemic Diam: 1.80 cm MV Decel Time: 213 msec MV E velocity: 62.60 cm/s MV A velocity: 77.20 cm/s MV E/A ratio:  0.81 Olga Millers MD Electronically signed by Olga Millers MD Signature Date/Time: 01/25/2023/11:21:22 AM    Final    DG C-Arm 1-60 Min  Result Date: 01/23/2023 CLINICAL DATA:  Cystography. EXAM: DG C-ARM 1-60 MIN CONTRAST:  None. FLUOROSCOPY: Fluoroscopy Time:  47.7 seconds Radiation Exposure Index (if provided by the fluoroscopic device): 4.9167 mGy Number of Acquired Spot Images: 1 COMPARISON:  None Available. FINDINGS: A single image demonstrates a left ureteral stent. Please see operative report for additional information. IMPRESSION: Intraoperative utilization of fluoroscopy. Electronically Signed   By: Thornell Sartorius M.D.   On: 01/23/2023 22:30      Assessment/Plan:  INTERVAL HISTORY: pts C diff ag +, toxin  negative and PCR +   Principal Problem:   Enterococcal bacteremia Active Problems:   Essential hypertension   Alcohol use disorder, moderate, dependence (HCC)   Elevated lipase   Hyponatremia   Lactic acidosis   Hypokalemia   Hypomagnesemia   Physical deconditioning   Hyperbilirubinemia   Hepatic steatosis   SDH (subdural hematoma) (HCC)   High anion gap metabolic acidosis   History of seizure due to alcohol withdrawal   History of bleeding peptic ulcer    SRINATH BLACKMER is a 62 y.o. male with severe alcoholism with cirrhosis and pancytopenia multiple falls now admitted after being found down and with Enterococcus faecalis bacteremia, thought to be a potential urinary source given his pyuria bilateral lateral kidney stones with hydronephrosis requiring cystoscopy with placement of ureteral stents, also with subdural hematoma  #1 E faecalis bacteremia  Will continue high-dose ampicillin  TTE without vegetations  Repeat blood cultures taken  #2 C diff ag +, toxin negative, PCR +, GI pahtogen panel also + for EPEC  He is WITHOUT diarrhea currently so I suspect this is all reflects colonization and not active infection.  He should remain on enteric precautions but will not initiate treatment.  #3 severe alcoholism with cirrhosis and multiple falls: Question whether he has competency    I have personally spent 50 minutes involved in face-to-face and non-face-to-face activities for this patient on the day of the visit. Professional time spent includes the following activities: Preparing to see the patient (review of tests), Obtaining and/or reviewing separately obtained history (admission/discharge record), Performing a medically appropriate examination and/or evaluation , Ordering medications/tests/procedures, referring and communicating with other health care professionals, Documenting clinical information in the EMR, Independently interpreting results (not separately reported),  Communicating results to the patient/family/caregiver, Counseling and educating the patient/family/caregiver and Care coordination (not separately reported).     LOS: 3 days   Acey Lav 01/25/2023, 7:04 PM

## 2023-01-26 ENCOUNTER — Other Ambulatory Visit (HOSPITAL_COMMUNITY): Payer: Self-pay

## 2023-01-26 DIAGNOSIS — I609 Nontraumatic subarachnoid hemorrhage, unspecified: Secondary | ICD-10-CM | POA: Diagnosis not present

## 2023-01-26 DIAGNOSIS — F1020 Alcohol dependence, uncomplicated: Secondary | ICD-10-CM | POA: Diagnosis not present

## 2023-01-26 DIAGNOSIS — R7881 Bacteremia: Secondary | ICD-10-CM | POA: Diagnosis not present

## 2023-01-26 DIAGNOSIS — E871 Hypo-osmolality and hyponatremia: Secondary | ICD-10-CM | POA: Diagnosis not present

## 2023-01-26 DIAGNOSIS — E876 Hypokalemia: Secondary | ICD-10-CM | POA: Diagnosis not present

## 2023-01-26 DIAGNOSIS — S065XAA Traumatic subdural hemorrhage with loss of consciousness status unknown, initial encounter: Secondary | ICD-10-CM | POA: Diagnosis not present

## 2023-01-26 DIAGNOSIS — E872 Acidosis, unspecified: Secondary | ICD-10-CM | POA: Diagnosis not present

## 2023-01-26 LAB — CBC
HCT: 30.7 % — ABNORMAL LOW (ref 39.0–52.0)
Hemoglobin: 10.4 g/dL — ABNORMAL LOW (ref 13.0–17.0)
MCH: 33.3 pg (ref 26.0–34.0)
MCHC: 33.9 g/dL (ref 30.0–36.0)
MCV: 98.4 fL (ref 80.0–100.0)
Platelets: 111 10*3/uL — ABNORMAL LOW (ref 150–400)
RBC: 3.12 MIL/uL — ABNORMAL LOW (ref 4.22–5.81)
RDW: 14.3 % (ref 11.5–15.5)
WBC: 3.7 10*3/uL — ABNORMAL LOW (ref 4.0–10.5)
nRBC: 0 % (ref 0.0–0.2)

## 2023-01-26 LAB — BASIC METABOLIC PANEL
Anion gap: 8 (ref 5–15)
BUN: 8 mg/dL (ref 8–23)
CO2: 26 mmol/L (ref 22–32)
Calcium: 8.9 mg/dL (ref 8.9–10.3)
Chloride: 101 mmol/L (ref 98–111)
Creatinine, Ser: 0.66 mg/dL (ref 0.61–1.24)
GFR, Estimated: >60 mL/min (ref >60)
Glucose, Bld: 109 mg/dL — ABNORMAL HIGH (ref 70–99)
Potassium: 3.5 mmol/L (ref 3.5–5.1)
Sodium: 135 mmol/L (ref 135–145)

## 2023-01-26 LAB — MAGNESIUM: Magnesium: 1.4 mg/dL — ABNORMAL LOW (ref 1.7–2.4)

## 2023-01-26 LAB — PHOSPHORUS: Phosphorus: 2.3 mg/dL — ABNORMAL LOW (ref 2.5–4.6)

## 2023-01-26 MED ORDER — AMOXICILLIN 500 MG PO CAPS
1000.0000 mg | ORAL_CAPSULE | Freq: Three times a day (TID) | ORAL | 0 refills | Status: AC
  Filled 2023-01-26: qty 168, 28d supply, fill #0

## 2023-01-26 MED ORDER — AMOXICILLIN 500 MG PO CAPS: 1000.0000 mg | ORAL_CAPSULE | Freq: Two times a day (BID) | ORAL | Status: DC

## 2023-01-26 MED ORDER — POTASSIUM CHLORIDE CRYS ER 20 MEQ PO TBCR
20.0000 meq | EXTENDED_RELEASE_TABLET | Freq: Once | ORAL | Status: AC
  Administered 2023-01-26: 20 meq via ORAL
  Filled 2023-01-26: qty 1

## 2023-01-26 MED ORDER — MAGNESIUM SULFATE 4 GM/100ML IV SOLN
4.0000 g | Freq: Once | INTRAVENOUS | Status: AC
  Administered 2023-01-26: 4 g via INTRAVENOUS
  Filled 2023-01-26: qty 100

## 2023-01-26 MED ORDER — POTASSIUM PHOSPHATES 15 MMOLE/5ML IV SOLN
30.0000 mmol | Freq: Once | INTRAVENOUS | Status: AC
  Administered 2023-01-26: 30 mmol via INTRAVENOUS
  Filled 2023-01-26: qty 10

## 2023-01-26 MED ORDER — AMOXICILLIN 500 MG PO CAPS
1000.0000 mg | ORAL_CAPSULE | Freq: Three times a day (TID) | ORAL | Status: DC
  Administered 2023-01-26 – 2023-02-06 (×34): 1000 mg via ORAL
  Filled 2023-01-26 (×37): qty 2

## 2023-01-26 NOTE — Progress Notes (Addendum)
PROGRESS NOTE        PATIENT DETAILS Name: Richard Andrews Age: 62 y.o. Sex: male Date of Birth: 29-Nov-1960 Admit Date: 01/22/2023 Admitting Physician Andris Baumann, MD DGL:OVFIEP, Joy, NP  Brief Summary: Patient is a 62 y.o.  male with history of EtOH use-prior hospitalizations related to complications of alcohol use-including subdural hematoma/left tibial plateau fracture-who was brought to the ED on 8/11-after he was found down and confused-upon further evaluation-he was found to have a new subdural hematoma, acute pancreatitis, bilateral hydronephrosis with renal stones and enterococcal bacteremia.  Significant events: 8/11>> admit to [facility]  Significant studies: 8/11>> CT head: New SDH 8/11>> CT C-spine: No fracture 8/12>> CT abdomen/pelvis: Acute pancreatitis, bilateral hydronephrosis with bilateral UPJ stones. 8/12>> CT head: Slightly increased size of SDH. 8/12>> renal ultrasound: Mild to moderate right hydronephrosis, left renal pelvic stone is again observed without substantial left hydronephrosis. 8/14>> TTE: EF 50-55%-no obvious vegetation  Significant microbiology data: 8/12>> blood culture: Enterococcus 8/13>> C. difficile study: Positive 8/13>> GI pathogen panel: EPEC detected 8/14>> blood culture: No growth  Procedures: 8/12>> bilateral ureteral stent placement by urology  Consults: ID Neurology Neurosurgery  Subjective: No major issues overnight-lying comfortably in bed.  Denies any diarrhea.  Objective: Vitals: Blood pressure 125/86, pulse 68, temperature 98.5 F (36.9 C), temperature source Oral, resp. rate 18, height 5\' 11"  (1.803 m), weight 57.2 kg, SpO2 96%.   Exam: Gen Exam:Alert awake-not in any distress HEENT:atraumatic, normocephalic Chest: B/L clear to auscultation anteriorly CVS:S1S2 regular Abdomen:soft non tender, non distended Extremities:no edema Neurology: Non focal Skin: no rash  Pertinent  Labs/Radiology:    Latest Ref Rng & Units 01/26/2023    2:59 AM 01/25/2023    2:33 AM 01/24/2023    6:08 AM  CBC  WBC 4.0 - 10.5 K/uL 3.7  5.3  3.8   Hemoglobin 13.0 - 17.0 g/dL 32.9  51.8  84.1   Hematocrit 39.0 - 52.0 % 30.7  29.3  29.1   Platelets 150 - 400 K/uL 111  99  69     Lab Results  Component Value Date   NA 135 01/26/2023   K 3.5 01/26/2023   CL 101 01/26/2023   CO2 26 01/26/2023      Assessment/Plan: Subdural hematoma Likely secondary to fall from being intoxicated Neurosurgery following-recommendations are for observation  Acute metabolic encephalopathy Secondary to combination of enterococcal bacteremia/alcohol withdrawal/SDH Seems to be much better today-awake/alert Supportive care-treat underlying etiologies  Sepsis secondary to enterococcal bacteremia Sepsis physiology improving IV ampicillin TTE negative for vegetation Repeat blood cultures on 8/14 negative so far ID following-await further recommendations.  Acute pancreatitis Exam benign-but positive lipase/imaging studies Supportive care  Bilateral hydronephrosis with UPJ stone Urology following-s/p bilateral ureteral stent placement on 8/12  Enteropathogenic E. coli colitis Not much diarrhea Already on ampicillin Although C. difficile antigen/toxin positive-suspect this is more for colonization rather than an infection as diarrhea is not much of a future.  EtOH use Awake/alert CIWA Thiamine/folic acid  Hypokalemia/hypomagnesemia/hypophosphatemia Secondary to EtOH use Will be repeated again today-recheck with a.m. labs.  Thrombocytopenia Probably secondary to EtOH use/sepsis Improving Appears to have some amount of baseline thrombocytopenia  Normocytic anemia Secondary to acute illness No evidence of blood loss Follow CBC  History of peptic ulcer disease PPI  Mood disorder Cymbalta  Debility/deconditioning PT/OT eval-needs to be more stable on  his feet before considering  discharge.  Underweight: Estimated body mass index is 17.57 kg/m as calculated from the following:   Height as of this encounter: 5\' 11"  (1.803 m).   Weight as of this encounter: 57.2 kg.   Code status:   Code Status: DNR   DVT Prophylaxis: enoxaparin (LOVENOX) injection 40 mg Start: 01/25/23 1330 SCDs Start: 01/23/23 0037   Family Communication: None at bedside   Disposition Plan: Status is: Inpatient Remains inpatient appropriate because: Severity of illness   Planned Discharge Destination:Home health vs SNF   Diet: Diet Order             Diet regular Room service appropriate? Yes; Fluid consistency: Thin  Diet effective now                     Antimicrobial agents: Anti-infectives (From admission, onward)    Start     Dose/Rate Route Frequency Ordered Stop   01/24/23 0800  vancomycin (VANCOCIN) IVPB 1000 mg/200 mL premix  Status:  Discontinued        1,000 mg 200 mL/hr over 60 Minutes Intravenous Every 24 hours 01/23/23 0847 01/24/23 0537   01/24/23 0800  ampicillin (OMNIPEN) 2 g in sodium chloride 0.9 % 100 mL IVPB        2 g 300 mL/hr over 20 Minutes Intravenous Every 4 hours 01/24/23 0537     01/23/23 1600  piperacillin-tazobactam (ZOSYN) IVPB 3.375 g  Status:  Discontinued        3.375 g 12.5 mL/hr over 240 Minutes Intravenous Every 8 hours 01/23/23 0847 01/24/23 0537   01/23/23 0845  piperacillin-tazobactam (ZOSYN) IVPB 3.375 g        3.375 g 100 mL/hr over 30 Minutes Intravenous  Once 01/23/23 0842 01/23/23 0940   01/23/23 0845  vancomycin (VANCOCIN) IVPB 1000 mg/200 mL premix        1,000 mg 200 mL/hr over 60 Minutes Intravenous  Once 01/23/23 0842 01/23/23 1046        MEDICATIONS: Scheduled Meds:  DULoxetine  30 mg Oral Daily   enoxaparin (LOVENOX) injection  40 mg Subcutaneous Q24H   Gerhardt's butt cream   Topical QID   LORazepam  0-4 mg Intravenous Q12H   Or   LORazepam  0-4 mg Oral Q12H   pantoprazole  40 mg Oral Daily   thiamine   100 mg Oral Daily   Or   thiamine  100 mg Intravenous Daily   Continuous Infusions:  sodium chloride 50 mL/hr at 01/24/23 1401   ampicillin (OMNIPEN) IV 2 g (01/26/23 0852)   potassium PHOSPHATE IVPB (in mmol) 30 mmol (01/26/23 0904)   PRN Meds:.acetaminophen **OR** acetaminophen, HYDROcodone-acetaminophen   I have personally reviewed following labs and imaging studies  LABORATORY DATA: CBC: Recent Labs  Lab 01/22/23 1523 01/22/23 1847 01/23/23 0333 01/24/23 0608 01/25/23 0233 01/26/23 0259  WBC 8.5  --  4.9 3.8* 5.3 3.7*  NEUTROABS 6.5  --   --   --  3.9  --   HGB 12.5* 11.9* 9.9* 10.0* 10.0* 10.4*  HCT 36.4* 35.0* 29.4* 29.1* 29.3* 30.7*  MCV 100.8*  --  99.3 98.6 100.7* 98.4  PLT PLATELET CLUMPS NOTED ON SMEAR, UNABLE TO ESTIMATE  --  72* 69* 99* 111*    Basic Metabolic Panel: Recent Labs  Lab 01/22/23 1632 01/22/23 1743 01/22/23 1743 01/22/23 1847 01/23/23 0104 01/23/23 0333 01/23/23 0922 01/24/23 0608 01/25/23 0233 01/26/23 0259  NA  --  130*  --  133*  --   --   --  133* 134* 135  K  --  2.5*   < > 2.7* 3.5 3.3*  --  3.2* 3.0* 3.5  CL  --  92*  --   --   --   --   --  98 101 101  CO2  --  21*  --   --   --   --   --  23 24 26   GLUCOSE  --  253*  --   --   --   --   --  133* 153* 109*  BUN  --  29*  --   --   --   --   --  15 16 8   CREATININE  --  1.13  --   --   --   --   --  0.66 0.67 0.66  CALCIUM  --  9.7  --   --   --   --   --  8.8* 8.6* 8.9  MG 1.3*  --   --   --   --   --  1.1* 1.2* 1.8 1.4*  PHOS  --   --   --   --   --   --   --  3.1 2.1* 2.3*   < > = values in this interval not displayed.    GFR: Estimated Creatinine Clearance: 76.5 mL/min (by C-G formula based on SCr of 0.66 mg/dL).  Liver Function Tests: Recent Labs  Lab 01/22/23 1743 01/24/23 0608 01/25/23 0233  AST 66* 38 43*  ALT 24 18 17   ALKPHOS 80 57 61  BILITOT 1.5* 1.2 0.6  PROT 7.6 6.0* 6.0*  ALBUMIN 3.4* 2.3* 2.3*   Recent Labs  Lab 01/22/23 1632  LIPASE 156*    Recent Labs  Lab 01/22/23 1633  AMMONIA 35    Coagulation Profile: Recent Labs  Lab 01/22/23 1632  INR 1.2    Cardiac Enzymes: Recent Labs  Lab 01/23/23 0104  CKTOTAL 385    BNP (last 3 results) No results for input(s): "PROBNP" in the last 8760 hours.  Lipid Profile: No results for input(s): "CHOL", "HDL", "LDLCALC", "TRIG", "CHOLHDL", "LDLDIRECT" in the last 72 hours.  Thyroid Function Tests: No results for input(s): "TSH", "T4TOTAL", "FREET4", "T3FREE", "THYROIDAB" in the last 72 hours.  Anemia Panel: No results for input(s): "VITAMINB12", "FOLATE", "FERRITIN", "TIBC", "IRON", "RETICCTPCT" in the last 72 hours.  Urine analysis:    Component Value Date/Time   COLORURINE AMBER (A) 01/22/2023 1633   APPEARANCEUR HAZY (A) 01/22/2023 1633   LABSPEC 1.021 01/22/2023 1633   PHURINE 5.0 01/22/2023 1633   GLUCOSEU 50 (A) 01/22/2023 1633   HGBUR MODERATE (A) 01/22/2023 1633   BILIRUBINUR NEGATIVE 01/22/2023 1633   KETONESUR NEGATIVE 01/22/2023 1633   PROTEINUR 100 (A) 01/22/2023 1633   NITRITE NEGATIVE 01/22/2023 1633   LEUKOCYTESUR TRACE (A) 01/22/2023 1633    Sepsis Labs: Lactic Acid, Venous    Component Value Date/Time   LATICACIDVEN 1.4 01/23/2023 0333    MICROBIOLOGY: Recent Results (from the past 240 hour(s))  Urine Culture (for pregnant, neutropenic or urologic patients or patients with an indwelling urinary catheter)     Status: None (Preliminary result)   Collection Time: 01/22/23 10:42 PM   Specimen: Urine, Clean Catch  Result Value Ref Range Status   Specimen Description URINE, CLEAN CATCH  Final   Special Requests NONE  Final   Culture   Final    CULTURE  REINCUBATED FOR BETTER GROWTH Performed at [facility], [address], [place], [name] [address]    Report Status PENDING  Incomplete  Culture, blood (Routine X 2) w Reflex to ID Panel     Status: Abnormal   Collection Time: 01/23/23  8:20 AM   Specimen: BLOOD  Result Value Ref  Range Status   Specimen Description BLOOD LEFT ANTECUBITAL  Final   Special Requests   Final    BOTTLES DRAWN AEROBIC AND ANAEROBIC Blood Culture adequate volume   Culture  Setup Time   Final    GRAM POSITIVE COCCI IN PAIRS IN CHAINS ANAEROBIC BOTTLE ONLY CRITICAL VALUE NOTED.  VALUE IS CONSISTENT WITH PREVIOUSLY REPORTED AND CALLED VALUE.    Culture (A)  Final    ENTEROCOCCUS FAECALIS SUSCEPTIBILITIES PERFORMED ON PREVIOUS CULTURE WITHIN THE LAST 5 DAYS. Performed at [facility], [address], [place], [name] [address]    Report Status 01/26/2023 FINAL  Final  Culture, blood (Routine X 2) w Reflex to ID Panel     Status: Abnormal   Collection Time: 01/23/23  8:20 AM   Specimen: BLOOD  Result Value Ref Range Status   Specimen Description BLOOD RIGHT ANTECUBITAL  Final   Special Requests   Final    BOTTLES DRAWN AEROBIC AND ANAEROBIC Blood Culture results may not be optimal due to an excessive volume of blood received in culture bottles   Culture  Setup Time   Final    GRAM POSITIVE COCCI IN CHAINS IN PAIRS IN BOTH AEROBIC AND ANAEROBIC BOTTLES CRITICAL RESULT CALLED TO, READ BACK BY AND VERIFIED WITH: PHARMD G. ABBOTT 01/24/23 @ 0527 BY AB Performed at [facility], [address] [place], [name] [address]    Culture ENTEROCOCCUS FAECALIS (A)  Final   Report Status 01/26/2023 FINAL  Final   Organism ID, Bacteria ENTEROCOCCUS FAECALIS  Final      Susceptibility   Enterococcus faecalis - MIC*    AMPICILLIN <=2 SENSITIVE Sensitive     VANCOMYCIN 1 SENSITIVE Sensitive     GENTAMICIN SYNERGY SENSITIVE Sensitive     * ENTEROCOCCUS FAECALIS  Blood Culture ID Panel (Reflexed)     Status: Abnormal   Collection Time: 01/23/23  8:20 AM  Result Value Ref Range Status   Enterococcus faecalis DETECTED (A) NOT DETECTED Final    Comment: CRITICAL RESULT CALLED TO, READ BACK BY AND VERIFIED WITH: PHARMD G. ABBOTT 01/24/23 @ 0527 BY AB    Enterococcus Faecium NOT DETECTED  NOT DETECTED Final   Listeria monocytogenes NOT DETECTED NOT DETECTED Final   Staphylococcus species NOT DETECTED NOT DETECTED Final   Staphylococcus aureus (BCID) NOT DETECTED NOT DETECTED Final   Staphylococcus epidermidis NOT DETECTED NOT DETECTED Final   Staphylococcus lugdunensis NOT DETECTED NOT DETECTED Final   Streptococcus species NOT DETECTED NOT DETECTED Final   Streptococcus agalactiae NOT DETECTED NOT DETECTED Final   Streptococcus pneumoniae NOT DETECTED NOT DETECTED Final   Streptococcus pyogenes NOT DETECTED NOT DETECTED Final   A.calcoaceticus-baumannii NOT DETECTED NOT DETECTED Final   Bacteroides fragilis NOT DETECTED NOT DETECTED Final   Enterobacterales NOT DETECTED NOT DETECTED Final   Enterobacter cloacae complex NOT DETECTED NOT DETECTED Final   Escherichia coli NOT DETECTED NOT DETECTED Final   Klebsiella aerogenes NOT DETECTED NOT DETECTED Final   Klebsiella oxytoca NOT DETECTED NOT DETECTED Final   Klebsiella pneumoniae NOT DETECTED NOT DETECTED Final   Proteus species NOT DETECTED NOT DETECTED Final  Salmonella species NOT DETECTED NOT DETECTED Final   Serratia marcescens NOT DETECTED NOT DETECTED Final   Haemophilus influenzae NOT DETECTED NOT DETECTED Final   Neisseria meningitidis NOT DETECTED NOT DETECTED Final   Pseudomonas aeruginosa NOT DETECTED NOT DETECTED Final   Stenotrophomonas maltophilia NOT DETECTED NOT DETECTED Final   Candida albicans NOT DETECTED NOT DETECTED Final   Candida auris NOT DETECTED NOT DETECTED Final   Candida glabrata NOT DETECTED NOT DETECTED Final   Candida krusei NOT DETECTED NOT DETECTED Final   Candida parapsilosis NOT DETECTED NOT DETECTED Final   Candida tropicalis NOT DETECTED NOT DETECTED Final   Cryptococcus neoformans/gattii NOT DETECTED NOT DETECTED Final   Vancomycin resistance NOT DETECTED NOT DETECTED Final    Comment: Performed at [facility], [address] [place], [name] [address]   Gastrointestinal Panel by PCR , Stool     Status: Abnormal   Collection Time: 01/24/23  6:17 PM   Specimen: Stool  Result Value Ref Range Status   Campylobacter species NOT DETECTED NOT DETECTED Final   Plesimonas shigelloides NOT DETECTED NOT DETECTED Final   Salmonella species NOT DETECTED NOT DETECTED Final   Yersinia enterocolitica NOT DETECTED NOT DETECTED Final   Vibrio species NOT DETECTED NOT DETECTED Final   Vibrio cholerae NOT DETECTED NOT DETECTED Final   Enteroaggregative E coli (EAEC) NOT DETECTED NOT DETECTED Final   Enteropathogenic E coli (EPEC) DETECTED (A) NOT DETECTED Final    Comment: RESULT CALLED TO, READ BACK BY AND VERIFIED WITH: DESIREE GRIFFITH 01/25/23 1001 KLW    Enterotoxigenic E coli (ETEC) NOT DETECTED NOT DETECTED Final   Shiga like toxin producing E coli (STEC) NOT DETECTED NOT DETECTED Final   Shigella/Enteroinvasive E coli (EIEC) NOT DETECTED NOT DETECTED Final   Cryptosporidium NOT DETECTED NOT DETECTED Final   Cyclospora cayetanensis NOT DETECTED NOT DETECTED Final   Entamoeba histolytica NOT DETECTED NOT DETECTED Final   Giardia lamblia NOT DETECTED NOT DETECTED Final   Adenovirus F40/41 NOT DETECTED NOT DETECTED Final   Astrovirus NOT DETECTED NOT DETECTED Final   Norovirus GI/GII NOT DETECTED NOT DETECTED Final   Rotavirus A NOT DETECTED NOT DETECTED Final   Sapovirus (I, II, IV, and V) NOT DETECTED NOT DETECTED Final    Comment: Performed at [facility], [address] [place], [name] [address]  C Difficile Quick Screen w PCR reflex     Status: Abnormal   Collection Time: 01/24/23  6:41 PM   Specimen: STOOL  Result Value Ref Range Status   C Diff antigen POSITIVE (A) NEGATIVE Final   C Diff toxin NEGATIVE NEGATIVE Final   C Diff interpretation Results are indeterminate. See PCR results.  Final    Comment: Performed at [facility], [address] [place], [name] [address]  C. Diff by PCR, Reflexed     Status: Abnormal    Collection Time: 01/24/23  6:41 PM  Result Value Ref Range Status   Toxigenic C. Difficile by PCR POSITIVE (A) NEGATIVE Final    Comment: Positive for toxigenic C. difficile with little to no toxin production. Only treat if clinical presentation suggests symptomatic illness. Performed at [facility], [address], [place], [name] [address]   Culture, blood (Routine X 2) w Reflex to ID Panel     Status: None (Preliminary result)   Collection Time: 01/25/23  8:38 AM   Specimen: BLOOD LEFT HAND  Result Value Ref Range Status   Specimen Description  BLOOD LEFT HAND  Final   Special Requests   Final    BOTTLES DRAWN AEROBIC AND ANAEROBIC AEROBIC BOTTLE ONLY   Culture   Final    NO GROWTH < 24 HOURS Performed at [facility], [address] [place], [name] [address]    Report Status PENDING  Incomplete  Culture, blood (Routine X 2) w Reflex to ID Panel     Status: None (Preliminary result)   Collection Time: 01/25/23  8:39 AM   Specimen: BLOOD RIGHT HAND  Result Value Ref Range Status   Specimen Description BLOOD RIGHT HAND  Final   Special Requests   Final    BOTTLES DRAWN AEROBIC AND ANAEROBIC Blood Culture adequate volume   Culture   Final    NO GROWTH < 24 HOURS Performed at [facility], [address] [place], [name] [address]    Report Status PENDING  Incomplete    RADIOLOGY STUDIES/RESULTS: ECHOCARDIOGRAM COMPLETE  Result Date: 01/25/2023    ECHOCARDIOGRAM REPORT   Patient Name:   GRACIN DAWS Date of Exam: 01/25/2023 Medical Rec #:  [id number]     Height:       71.0 in Accession #:    [id number]    Weight:       126.0 lb Date of Birth:  19-Feb-1961     BSA:          1.733 m Patient Age:    62 years      BP:           139/82 mmHg Patient Gender: M             HR:           61 bpm. Exam Location:  Inpatient Procedure: 2D Echo, Cardiac Doppler and Color Doppler Indications:    Bacteremia  History:        Patient has prior history of Echocardiogram  examinations, most                 recent 01/19/2021. SDH, hx ICH; Risk Factors:Hypertension, Current                 Smoker and alcohol abuse.  Sonographer:    Wallie Char Referring Phys: VAN DAM, CORNELIUS, N IMPRESSIONS  1. Left ventricular ejection fraction, by estimation, is 50 to 55%. The left ventricle has low normal function. The left ventricle has no regional wall motion abnormalities. Left ventricular diastolic parameters were normal.  2. Right ventricular systolic function is normal. The right ventricular size is normal.  3. The mitral valve is normal in structure. Trivial mitral valve regurgitation. No evidence of mitral stenosis.  4. The aortic valve is tricuspid. Aortic valve regurgitation is not visualized. No aortic stenosis is present.  5. The inferior vena cava is normal in size with greater than 50% respiratory variability, suggesting right atrial pressure of 3 mmHg. FINDINGS  Left Ventricle: Left ventricular ejection fraction, by estimation, is 50 to 55%. The left ventricle has low normal function. The left ventricle has no regional wall motion abnormalities. The left ventricular internal cavity size was normal in size. There is no left ventricular hypertrophy. Left ventricular diastolic parameters were normal. Right Ventricle: The right ventricular size is normal. Right ventricular systolic function is normal. Left Atrium: Left atrial size was normal in size. Right Atrium: Right atrial size was normal in size. Pericardium: There is no evidence of pericardial effusion. Mitral Valve: The mitral valve is normal in structure.  Trivial mitral valve regurgitation. No evidence of mitral valve stenosis. MV peak gradient, 2.5 mmHg. The mean mitral valve gradient is 1.0 mmHg. Tricuspid Valve: The tricuspid valve is normal in structure. Tricuspid valve regurgitation is trivial. No evidence of tricuspid stenosis. Aortic Valve: The aortic valve is tricuspid. Aortic valve regurgitation is not visualized. No  aortic stenosis is present. Aortic valve mean gradient measures 3.5 mmHg. Aortic valve peak gradient measures 5.9 mmHg. Aortic valve area, by VTI measures 1.99 cm. Pulmonic Valve: The pulmonic valve was not well visualized. Pulmonic valve regurgitation is not visualized. No evidence of pulmonic stenosis. Aorta: The aortic root is normal in size and structure. Venous: The inferior vena cava is normal in size with greater than 50% respiratory variability, suggesting right atrial pressure of 3 mmHg. IAS/Shunts: No atrial level shunt detected by color flow Doppler.  LEFT VENTRICLE PLAX 2D LVIDd:         5.30 cm     Diastology LVIDs:         3.80 cm     LV e' medial:    8.66 cm/s LV PW:         0.90 cm     LV E/e' medial:  7.2 LV IVS:        0.70 cm     LV e' lateral:   9.52 cm/s LVOT diam:     1.80 cm     LV E/e' lateral: 6.6 LV SV:         51 LV SV Index:   30 LVOT Area:     2.54 cm  LV Volumes (MOD) LV vol d, MOD A2C: 62.4 ml LV vol d, MOD A4C: 91.4 ml LV vol s, MOD A2C: 29.9 ml LV vol s, MOD A4C: 43.3 ml LV SV MOD A2C:     32.5 ml LV SV MOD A4C:     91.4 ml LV SV MOD BP:      40.0 ml RIGHT VENTRICLE          IVC RV Basal diam:  3.00 cm  IVC diam: 2.40 cm TAPSE (M-mode): 2.4 cm LEFT ATRIUM             Index        RIGHT ATRIUM           Index LA diam:        3.80 cm 2.19 cm/m   RA Area:     15.20 cm LA Vol (A2C):   35.5 ml 20.49 ml/m  RA Volume:   38.90 ml  22.45 ml/m LA Vol (A4C):   30.3 ml 17.49 ml/m LA Biplane Vol: 34.1 ml 19.68 ml/m  AORTIC VALVE AV Area (Vmax):    1.99 cm AV Area (Vmean):   1.97 cm AV Area (VTI):     1.99 cm AV Vmax:           121.00 cm/s AV Vmean:          86.250 cm/s AV VTI:            0.257 m AV Peak Grad:      5.9 mmHg AV Mean Grad:      3.5 mmHg LVOT Vmax:         94.80 cm/s LVOT Vmean:        66.700 cm/s LVOT VTI:          0.201 m LVOT/AV VTI ratio: 0.78  AORTA Ao Root diam: 3.40 cm Ao Asc diam:  3.70 cm MITRAL VALVE  TRICUSPID VALVE MV Area (PHT): 3.56 cm    TR  Peak grad:   27.0 mmHg MV Area VTI:   1.99 cm    TR Vmax:        260.00 cm/s MV Peak grad:  2.5 mmHg MV Mean grad:  1.0 mmHg    SHUNTS MV Vmax:       0.79 m/s    Systemic VTI:  0.20 m MV Vmean:      46.4 cm/s   Systemic Diam: 1.80 cm MV Decel Time: 213 msec MV E velocity: 62.60 cm/s MV A velocity: 77.20 cm/s MV E/A ratio:  0.81 Olga Millers MD Electronically signed by Olga Millers MD Signature Date/Time: 01/25/2023/11:21:22 AM    Final      LOS: 4 days   Jeoffrey Massed, MD  [facility]    To contact the attending provider between 7A-7P or the covering provider during after hours 7P-7A, please log into the web site [email or web address] and access using universal [place] password for that web site. If you do not have the password, please call the hospital operator.  01/26/2023, 11:31 AM

## 2023-01-26 NOTE — Progress Notes (Signed)
   Inpatient Rehab Admissions Coordinator :  Per therapy recommendations patient was screened for CIR candidacy by Barbara Boyette RN MSN. Patient is not yet at a level to tolerate the intensity required to pursue a CIR admit . The CIR admissions team will follow and monitor for progress and place a Rehab Consult order if felt to be appropriate. Please contact me with any questions.  Barbara Boyette RN MSN Admissions Coordinator [phone]  

## 2023-01-26 NOTE — Progress Notes (Signed)
[facility]  Date of Admission:  01/22/2023      Total days of antibiotics 5  Ampicillin 8/13 >> c          ASSESSMENT: Richard Andrews is a 62 y.o. 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  He has improved clinically rapidly on IV antibiotics. We discussed transitioning to high dose amoxicillin 1gm TID to continue until his next appointment. Would prefer to keep on abx to suppress infection in the setting of stent placement. Would arrange for a month of abx to be sent with him at D/C. I do have some concerns about him being able to take care of himself at home and compliance with antibiotics.   Enteral pathogens - no diarrhea, colonized for CDiff based on lab interpretation. EPEC detected but self limited and likely ongoing shedding detected in stool after clinical resolution. Nothing further needed.   FU arranged with Dr. Daiva Eves on 9/5 @ 9:45 and placed on D/C navigator.  ID will sign off - please call back with any questions/concerns or if we can be of further assistance.    PLAN: Can transition to oral amoxicillin 1 gm TID - would prepare 30d to be given through [facility] clinic.  FU arranged with Dr. Daiva Eves on 9/5 @ 9:45   Principal Problem:   Enterococcal bacteremia Active Problems:   Essential hypertension   Alcohol use disorder, moderate, dependence (HCC)   Elevated lipase   Hyponatremia   Lactic acidosis   Hypokalemia   Hypomagnesemia   Physical deconditioning   Hyperbilirubinemia   Hepatic steatosis   SDH (subdural hematoma) (HCC)   High anion gap metabolic acidosis   History of seizure due to alcohol withdrawal   History of bleeding peptic ulcer   Colitis due to Clostridioides difficile    DULoxetine  30 mg Oral Daily   enoxaparin (LOVENOX) injection  40 mg Subcutaneous Q24H   Gerhardt's butt cream   Topical QID   LORazepam  0-4 mg Intravenous Q12H   Or   LORazepam  0-4 mg Oral Q12H    pantoprazole  40 mg Oral Daily   thiamine  100 mg Oral Daily   Or   thiamine  100 mg Intravenous Daily    SUBJECTIVE: Feels better. No complaints today. Wants to be discharged home.    Review of Systems: Review of Systems  Constitutional:  Negative for chills and fever.  Gastrointestinal:  Negative for abdominal pain, diarrhea, nausea and vomiting.  Genitourinary:  Negative for dysuria.     No Known Allergies  OBJECTIVE: Vitals:   01/25/23 1940 01/26/23 0017 01/26/23 0450 01/26/23 1000  BP: (!) 145/90 135/87 (!) 151/89 125/86  Pulse: 80 66 61 68  Resp: 15 16 18    Temp: 98.1 F (36.7 C) 97.9 F (36.6 C) 98.5 F (36.9 C)   TempSrc: Oral Oral Oral   SpO2: 97% 96%    Weight:      Height:       Body mass index is 17.57 kg/m.  Physical Exam Vitals reviewed.  Cardiovascular:     Rate and Rhythm: Normal rate and regular rhythm.  Pulmonary:     Effort: Pulmonary effort is normal.     Breath sounds: Normal breath sounds.  Abdominal:     General: Bowel sounds are normal. There is no distension.     Palpations: Abdomen is soft.  Skin:  General: Skin is warm and dry.     Capillary Refill: Capillary refill takes less than 2 seconds.  Neurological:     Mental Status: He is alert.     Lab Results Lab Results  Component Value Date   WBC 3.7 (L) 01/26/2023   HGB 10.4 (L) 01/26/2023   HCT 30.7 (L) 01/26/2023   MCV 98.4 01/26/2023   PLT 111 (L) 01/26/2023    Lab Results  Component Value Date   CREATININE 0.66 01/26/2023   BUN 8 01/26/2023   NA 135 01/26/2023   K 3.5 01/26/2023   CL 101 01/26/2023   CO2 26 01/26/2023    Lab Results  Component Value Date   ALT 17 01/25/2023   AST 43 (H) 01/25/2023   ALKPHOS 61 01/25/2023   BILITOT 0.6 01/25/2023     Microbiology: Recent Results (from the past 240 hour(s))  Urine Culture (for pregnant, neutropenic or urologic patients or patients with an indwelling urinary catheter)     Status: Abnormal (Preliminary  result)   Collection Time: 01/22/23 10:42 PM   Specimen: Urine, Clean Catch  Result Value Ref Range Status   Specimen Description URINE, CLEAN CATCH  Final   Special Requests NONE  Final   Culture (A)  Final    60,000 COLONIES/mL ENTEROCOCCUS FAECALIS SUSCEPTIBILITIES TO FOLLOW Performed at [facility], [address], [place], [name] [address]    Report Status PENDING  Incomplete  Culture, blood (Routine X 2) w Reflex to ID Panel     Status: Abnormal   Collection Time: 01/23/23  8:20 AM   Specimen: BLOOD  Result Value Ref Range Status   Specimen Description BLOOD LEFT ANTECUBITAL  Final   Special Requests   Final    BOTTLES DRAWN AEROBIC AND ANAEROBIC Blood Culture adequate volume   Culture  Setup Time   Final    GRAM POSITIVE COCCI IN PAIRS IN CHAINS ANAEROBIC BOTTLE ONLY CRITICAL VALUE NOTED.  VALUE IS CONSISTENT WITH PREVIOUSLY REPORTED AND CALLED VALUE.    Culture (A)  Final    ENTEROCOCCUS FAECALIS SUSCEPTIBILITIES PERFORMED ON PREVIOUS CULTURE WITHIN THE LAST 5 DAYS. Performed at [facility], [address], [place], [name] [address]    Report Status 01/26/2023 FINAL  Final  Culture, blood (Routine X 2) w Reflex to ID Panel     Status: Abnormal   Collection Time: 01/23/23  8:20 AM   Specimen: BLOOD  Result Value Ref Range Status   Specimen Description BLOOD RIGHT ANTECUBITAL  Final   Special Requests   Final    BOTTLES DRAWN AEROBIC AND ANAEROBIC Blood Culture results may not be optimal due to an excessive volume of blood received in culture bottles   Culture  Setup Time   Final    GRAM POSITIVE COCCI IN CHAINS IN PAIRS IN BOTH AEROBIC AND ANAEROBIC BOTTLES CRITICAL RESULT CALLED TO, READ BACK BY AND VERIFIED WITH: PHARMD G. ABBOTT 01/24/23 @ 0527 BY AB Performed at [facility], [address] [place], [name] [address]    Culture ENTEROCOCCUS FAECALIS (A)  Final   Report Status 01/26/2023 FINAL  Final   Organism ID, Bacteria ENTEROCOCCUS  FAECALIS  Final      Susceptibility   Enterococcus faecalis - MIC*    AMPICILLIN <=2 SENSITIVE Sensitive     VANCOMYCIN 1 SENSITIVE Sensitive     GENTAMICIN SYNERGY SENSITIVE Sensitive     * ENTEROCOCCUS FAECALIS  Blood Culture ID Panel (Reflexed)  Status: Abnormal   Collection Time: 01/23/23  8:20 AM  Result Value Ref Range Status   Enterococcus faecalis DETECTED (A) NOT DETECTED Final    Comment: CRITICAL RESULT CALLED TO, READ BACK BY AND VERIFIED WITH: PHARMD G. ABBOTT 01/24/23 @ 0527 BY AB    Enterococcus Faecium NOT DETECTED NOT DETECTED Final   Listeria monocytogenes NOT DETECTED NOT DETECTED Final   Staphylococcus species NOT DETECTED NOT DETECTED Final   Staphylococcus aureus (BCID) NOT DETECTED NOT DETECTED Final   Staphylococcus epidermidis NOT DETECTED NOT DETECTED Final   Staphylococcus lugdunensis NOT DETECTED NOT DETECTED Final   Streptococcus species NOT DETECTED NOT DETECTED Final   Streptococcus agalactiae NOT DETECTED NOT DETECTED Final   Streptococcus pneumoniae NOT DETECTED NOT DETECTED Final   Streptococcus pyogenes NOT DETECTED NOT DETECTED Final   A.calcoaceticus-baumannii NOT DETECTED NOT DETECTED Final   Bacteroides fragilis NOT DETECTED NOT DETECTED Final   Enterobacterales NOT DETECTED NOT DETECTED Final   Enterobacter cloacae complex NOT DETECTED NOT DETECTED Final   Escherichia coli NOT DETECTED NOT DETECTED Final   Klebsiella aerogenes NOT DETECTED NOT DETECTED Final   Klebsiella oxytoca NOT DETECTED NOT DETECTED Final   Klebsiella pneumoniae NOT DETECTED NOT DETECTED Final   Proteus species NOT DETECTED NOT DETECTED Final   Salmonella species NOT DETECTED NOT DETECTED Final   Serratia marcescens NOT DETECTED NOT DETECTED Final   Haemophilus influenzae NOT DETECTED NOT DETECTED Final   Neisseria meningitidis NOT DETECTED NOT DETECTED Final   Pseudomonas aeruginosa NOT DETECTED NOT DETECTED Final   Stenotrophomonas maltophilia NOT DETECTED NOT  DETECTED Final   Candida albicans NOT DETECTED NOT DETECTED Final   Candida auris NOT DETECTED NOT DETECTED Final   Candida glabrata NOT DETECTED NOT DETECTED Final   Candida krusei NOT DETECTED NOT DETECTED Final   Candida parapsilosis NOT DETECTED NOT DETECTED Final   Candida tropicalis NOT DETECTED NOT DETECTED Final   Cryptococcus neoformans/gattii NOT DETECTED NOT DETECTED Final   Vancomycin resistance NOT DETECTED NOT DETECTED Final    Comment: Performed at [facility], [address], [place], [name] [address]  Gastrointestinal Panel by PCR , Stool     Status: Abnormal   Collection Time: 01/24/23  6:17 PM   Specimen: Stool  Result Value Ref Range Status   Campylobacter species NOT DETECTED NOT DETECTED Final   Plesimonas shigelloides NOT DETECTED NOT DETECTED Final   Salmonella species NOT DETECTED NOT DETECTED Final   Yersinia enterocolitica NOT DETECTED NOT DETECTED Final   Vibrio species NOT DETECTED NOT DETECTED Final   Vibrio cholerae NOT DETECTED NOT DETECTED Final   Enteroaggregative E coli (EAEC) NOT DETECTED NOT DETECTED Final   Enteropathogenic E coli (EPEC) DETECTED (A) NOT DETECTED Final    Comment: RESULT CALLED TO, READ BACK BY AND VERIFIED WITH: DESIREE GRIFFITH 01/25/23 1001 KLW    Enterotoxigenic E coli (ETEC) NOT DETECTED NOT DETECTED Final   Shiga like toxin producing E coli (STEC) NOT DETECTED NOT DETECTED Final   Shigella/Enteroinvasive E coli (EIEC) NOT DETECTED NOT DETECTED Final   Cryptosporidium NOT DETECTED NOT DETECTED Final   Cyclospora cayetanensis NOT DETECTED NOT DETECTED Final   Entamoeba histolytica NOT DETECTED NOT DETECTED Final   Giardia lamblia NOT DETECTED NOT DETECTED Final   Adenovirus F40/41 NOT DETECTED NOT DETECTED Final   Astrovirus NOT DETECTED NOT DETECTED Final   Norovirus GI/GII NOT DETECTED NOT DETECTED Final   Rotavirus A NOT DETECTED NOT DETECTED Final   Sapovirus (I, II,  IV, and V) NOT DETECTED NOT DETECTED Final     Comment: Performed at [facility], [address] [place], [name] [address]  C Difficile Quick Screen w PCR reflex     Status: Abnormal   Collection Time: 01/24/23  6:41 PM   Specimen: STOOL  Result Value Ref Range Status   C Diff antigen POSITIVE (A) NEGATIVE Final   C Diff toxin NEGATIVE NEGATIVE Final   C Diff interpretation Results are indeterminate. See PCR results.  Final    Comment: Performed at [facility], [address] [place], [name] [address]  C. Diff by PCR, Reflexed     Status: Abnormal   Collection Time: 01/24/23  6:41 PM  Result Value Ref Range Status   Toxigenic C. Difficile by PCR POSITIVE (A) NEGATIVE Final    Comment: Positive for toxigenic C. difficile with little to no toxin production. Only treat if clinical presentation suggests symptomatic illness. Performed at [facility], [address], [place], [name] [address]   Culture, blood (Routine X 2) w Reflex to ID Panel     Status: None (Preliminary result)   Collection Time: 01/25/23  8:38 AM   Specimen: BLOOD LEFT HAND  Result Value Ref Range Status   Specimen Description BLOOD LEFT HAND  Final   Special Requests   Final    BOTTLES DRAWN AEROBIC AND ANAEROBIC AEROBIC BOTTLE ONLY   Culture   Final    NO GROWTH < 24 HOURS Performed at [facility], [address], [place], [name] [address]    Report Status PENDING  Incomplete  Culture, blood (Routine X 2) w Reflex to ID Panel     Status: None (Preliminary result)   Collection Time: 01/25/23  8:39 AM   Specimen: BLOOD RIGHT HAND  Result Value Ref Range Status   Specimen Description BLOOD RIGHT HAND  Final   Special Requests   Final    BOTTLES DRAWN AEROBIC AND ANAEROBIC Blood Culture adequate volume   Culture   Final    NO GROWTH < 24 HOURS Performed at [facility], [address], [place], [name] [address]    Report Status PENDING  Incomplete    Rexene Alberts, MSN, NP-C [facility]  [email or web address] Pager: [phone] Office: [phone] [facility] Main Line: [phone] *Secure Chat Communication Welcome
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
## 2023-01-27 DIAGNOSIS — E876 Hypokalemia: Secondary | ICD-10-CM | POA: Diagnosis not present

## 2023-01-27 DIAGNOSIS — S065XAA Traumatic subdural hemorrhage with loss of consciousness status unknown, initial encounter: Secondary | ICD-10-CM | POA: Diagnosis not present

## 2023-01-27 DIAGNOSIS — R7881 Bacteremia: Secondary | ICD-10-CM | POA: Diagnosis not present

## 2023-01-27 DIAGNOSIS — F1020 Alcohol dependence, uncomplicated: Secondary | ICD-10-CM | POA: Diagnosis not present

## 2023-01-27 LAB — BASIC METABOLIC PANEL
Anion gap: 9 (ref 5–15)
BUN: 8 mg/dL (ref 8–23)
CO2: 26 mmol/L (ref 22–32)
Calcium: 8.7 mg/dL — ABNORMAL LOW (ref 8.9–10.3)
Chloride: 98 mmol/L (ref 98–111)
Creatinine, Ser: 0.63 mg/dL (ref 0.61–1.24)
GFR, Estimated: >60 mL/min (ref >60)
Glucose, Bld: 105 mg/dL — ABNORMAL HIGH (ref 70–99)
Potassium: 3.6 mmol/L (ref 3.5–5.1)
Sodium: 133 mmol/L — ABNORMAL LOW (ref 135–145)

## 2023-01-27 LAB — MAGNESIUM: Magnesium: 1.8 mg/dL (ref 1.7–2.4)

## 2023-01-27 LAB — PHOSPHORUS: Phosphorus: 3.4 mg/dL (ref 2.5–4.6)

## 2023-01-27 MED ORDER — POTASSIUM CHLORIDE CRYS ER 20 MEQ PO TBCR
20.0000 meq | EXTENDED_RELEASE_TABLET | Freq: Once | ORAL | Status: AC
  Administered 2023-01-27: 20 meq via ORAL
  Filled 2023-01-27: qty 1

## 2023-01-27 MED ORDER — MAGNESIUM SULFATE 2 GM/50ML IV SOLN
2.0000 g | Freq: Once | INTRAVENOUS | Status: AC
  Administered 2023-01-27: 2 g via INTRAVENOUS
  Filled 2023-01-27: qty 50

## 2023-01-27 NOTE — TOC Initial Note (Signed)
Transition of Care [facility]) - Initial/Assessment Note    Patient Details  Name: Richard Andrews MRN: [id number] Date of Birth: May 07, 1961  Transition of [facility]) CM/SW Contact:    Mearl Latin, LCSW Phone Number: 01/27/2023, 1:51 PM  Clinical Narrative:                 CSW received consult for possible SNF placement at time of discharge. CSW spoke with patient's son, [place]. He reported that SNF may be a good idea but they are trying to see if his uncle can come stay with patient at discharge as he does not work. Richard Andrews is going to call his mom (patient and she are separated but she is still Horticulturist, commercial) and ask her opinion. He will contact CSW back.   Skilled Nursing Rehab Facilities-   [email or web address]   Ratings out of 5 stars (5 the highest)   Name Address  Phone # Quality Care Staffing Health Inspection Overall  [facility] [address], [place] [phone] 2 1 5 4   [facility] [address] [place] [phone] 4 1 3 2   [facility] [address], [place] Otto [phone] [facility] [address], [place] [phone] 4 1 3 2   [facility]  [address], [place] [phone] 3 2 5 5   [facility] [address], [facility] [phone] 2 1 2 1   [facility] [address] [place] [phone] 4 1 2 1   [facility] [address], [place] [phone] 2 4 3 3   [address] ([place]) [address], [place] [phone] 3 2 2 2   [facility] [address] [place], [place] [phone] 1 2 1 1   [facility] [address], [place] [phone] 3 1 1 1   [facility] [address] Richard Andrews [phone] 4 3 4 4   [facility] [address] [place] [phone] 3 4 3 3           [facility] [address], [place] [phone]      [facility] [id number], [name]  [address], [place] [phone] 1 1 2 1   [name] [address], [employer] [phone] 2 2 4 4   [facility] [address] [phone] 2 1 4 3   [facility] [address] [place] [phone] 3 3 3 3           [address] (no [facility]) [address], [place] [phone] 4 4 5 5   [facility] (No [employer]) [address] [place]ver[place] [phone] 2 2 4 4   [facility] [address] [place] [phone] 2 1 2 1   [facility] (No [employer]) [address], [place] [phone] 5 5 5 5   [facility] [address] [facility] [phone] 2 3 5 5   [facility] [address], [place] [phone] 2 1 1 1   [place] [address] [place] Andrews [phone] 5 2 5 5   [facility]  [address], [place] [phone] 2 2 2 2   [facility] [address] [place] [phone] 4 2 1 1   [facility] [address] [place], [place] [phone] 2 2 3 3           [facility] [address], [place] [phone] 1 1 1 1   [facility]el[facility] [address] Richard Andrews  [phone] 2 3 3 3   [facility] (No [employer]) [address], [place] [phone]  2 1 3 2   [facility]) [address], Richard Andrews [phone] 1 1 1 1   [facility] [address] Richard Andrews [phone] 3 2 5 5   [facility] [address] [place], [place] [phone] 2 1 1 1           [name] [address] [place], [place] [phone] 4 4 5 5   [facility])  [address] [place] [phone] 2 1 2 1   [name] [address] [place] [phone]  1 4 3   [facility] [place] [address], [place] [phone] 3 5 4 5   [address] [place], [place] [phone] 3 2 2 2   [facility]) [address] [place] [phone] 2 1 3 2        Barriers to Discharge: Continued Medical Work up   Patient Goals and  CMS Choice Patient states their goals for this hospitalization and ongoing recovery are:: Return home CMS Medicare.gov Compare Post Acute Care list provided to:: Patient Represenative (must comment) Choice offered to / list presented to : Adult Children  ownership interest in [facility].provided to:: Adult Children    Expected Discharge Plan and Services In-house Referral: Clinical Social Work     Living arrangements for the past 2 months: Single Family Home                                      Prior Living Arrangements/Services Living arrangements for the past 2 months: Single Family Home Lives with:: Self Patient language and need for interpreter reviewed:: Yes Do you feel safe going back to the place where you live?: Yes      Need for Family Participation in Patient Care: Yes (Comment) Care giver support system in place?: Yes (comment) Current home services: DME Criminal Activity/Legal Involvement Pertinent to Current Situation/Hospitalization: No - Comment as needed  Activities of Daily Living Home Assistive Devices/Equipment: None ADL Screening (condition at time of admission) Patient's cognitive ability adequate to safely complete daily activities?: No Is the patient deaf or have difficulty hearing?: No Does the patient have difficulty seeing, even when wearing glasses/contacts?: No Does the patient have difficulty concentrating, remembering, or making decisions?: Yes Patient able to express need for assistance with ADLs?: No Does the patient have difficulty dressing or bathing?: Yes Independently performs ADLs?: No Communication: Needs assistance Is this a change from baseline?: Change from baseline, expected to last <3 days Dressing (OT): Needs assistance Is this a change from baseline?: Change from baseline, expected to last <3days Grooming: Needs assistance Is this a change from baseline?: Change from baseline, expected to last <3  days Feeding: Needs assistance Bathing: Needs assistance Is this a change from baseline?: Change from baseline, expected to last <3 days Toileting: Needs assistance Is this a change from baseline?: Change from baseline, expected to last <3 days In/Out Bed: Needs assistance Is this a change from baseline?: Change from baseline, expected to last >3 days Walks in Home: Needs assistance Is this a change from baseline?: Change from baseline, expected to last >3 days Does the patient have difficulty walking or climbing stairs?: Yes Weakness of Legs: Both Weakness of Arms/Hands: Both  Permission Sought/Granted Permission sought to share information with : Facility Medical sales representative, Family Supports Permission granted to share information with : No  Share Information with NAME: [employer]  Permission granted to share info w Relationship: Son  Permission granted to share info w Contact Information: [phone]  Emotional Assessment Appearance:: Appears stated age Attitude/Demeanor/Rapport: Unable to Assess Affect (typically observed): Unable to Assess Orientation: : Oriented to Self, Oriented to Place Alcohol / Substance Use: Alcohol Use Psych Involvement: No (comment)  Admission diagnosis:  Dehydration [E86.0] Hypokalemia [E87.6] Lactic acidosis [E87.20] Hypomagnesemia [E83.42] Alcohol abuse [F10.10] Confusion [R41.0] Subdural hematoma (HCC) [S06.5XAA] SDH (subdural hematoma) (HCC) [S06.5XAA] Malnutrition, unspecified type (HCC) [E46] Patient Active Problem List   Diagnosis Date Noted   Colitis due to Clostridioides difficile 01/25/2023   Enterococcal bacteremia 01/24/2023   History of seizure due to alcohol withdrawal 01/23/2023   History of bleeding peptic ulcer 01/23/2023   Hyponatremia 01/22/2023   Lactic acidosis 01/22/2023   Hypokalemia 01/22/2023   Hypomagnesemia 01/22/2023   Physical deconditioning 01/22/2023   Hyperbilirubinemia 01/22/2023   Hepatic steatosis  01/22/2023   SDH (subdural hematoma) (HCC) 01/22/2023   High anion gap metabolic acidosis 01/22/2023   Closed nondisplaced fracture of left tibial plateau 11/21/2022   ICH (intracerebral hemorrhage) (HCC) 11/15/2022   Subdural hematoma (HCC) 10/07/2022   Fall 09/24/2022   Alcohol withdrawal syndrome without complication (HCC)    Gastritis and gastroduodenitis    Multiple gastric ulcers    Duodenal ulcer    Acute blood loss anemia    Alcohol withdrawal seizure with complication (HCC)    GI bleed 01/19/2021   QT prolongation 01/19/2021   Electrolyte imbalance 02/06/2020   Macrocytic anemia 02/06/2020   Acute pain of right wrist 02/06/2020   Encounter for orthopedic follow-up care 01/28/2020   Open Colles' fracture of right radius 01/10/2020   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 [name] [address] Phone: [phone] Fax: [phone]  [name] [address] [name] [address] Phone: [phone] Fax: [phone]     Social Determinants of Health (SDOH) Social History: SDOH Screenings   Housing: Patient Unable To Answer (01/23/2023)  Depression (PHQ2-9): Low Risk  (02/01/2021)  Tobacco Use: High Risk (01/23/2023)   SDOH Interventions:     Readmission Risk Interventions     No data to display
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
## 2023-01-28 ENCOUNTER — Other Ambulatory Visit: Payer: Self-pay | Admitting: Medical-Surgical

## 2023-01-28 DIAGNOSIS — S065XAA Traumatic subdural hemorrhage with loss of consciousness status unknown, initial encounter: Secondary | ICD-10-CM | POA: Diagnosis not present

## 2023-01-28 DIAGNOSIS — E8729 Other acidosis: Secondary | ICD-10-CM | POA: Diagnosis not present

## 2023-01-28 DIAGNOSIS — E872 Acidosis, unspecified: Secondary | ICD-10-CM | POA: Diagnosis not present

## 2023-01-28 DIAGNOSIS — R7881 Bacteremia: Secondary | ICD-10-CM | POA: Diagnosis not present

## 2023-01-28 LAB — CBC
HCT: 31.2 % — ABNORMAL LOW (ref 39.0–52.0)
Hemoglobin: 10.5 g/dL — ABNORMAL LOW (ref 13.0–17.0)
MCH: 33.7 pg (ref 26.0–34.0)
MCHC: 33.7 g/dL (ref 30.0–36.0)
MCV: 100 fL (ref 80.0–100.0)
Platelets: 165 10*3/uL (ref 150–400)
RBC: 3.12 MIL/uL — ABNORMAL LOW (ref 4.22–5.81)
RDW: 14.6 % (ref 11.5–15.5)
WBC: 4.5 10*3/uL (ref 4.0–10.5)
nRBC: 0 % (ref 0.0–0.2)

## 2023-01-28 LAB — PHOSPHORUS: Phosphorus: 3 mg/dL (ref 2.5–4.6)

## 2023-01-28 LAB — BASIC METABOLIC PANEL
Anion gap: 11 (ref 5–15)
BUN: 10 mg/dL (ref 8–23)
CO2: 26 mmol/L (ref 22–32)
Calcium: 8.7 mg/dL — ABNORMAL LOW (ref 8.9–10.3)
Chloride: 96 mmol/L — ABNORMAL LOW (ref 98–111)
Creatinine, Ser: 0.6 mg/dL — ABNORMAL LOW (ref 0.61–1.24)
GFR, Estimated: >60 mL/min (ref >60)
Glucose, Bld: 104 mg/dL — ABNORMAL HIGH (ref 70–99)
Potassium: 3.9 mmol/L (ref 3.5–5.1)
Sodium: 133 mmol/L — ABNORMAL LOW (ref 135–145)

## 2023-01-28 LAB — MAGNESIUM: Magnesium: 2 mg/dL (ref 1.7–2.4)
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
## 2023-01-28 NOTE — Plan of Care (Signed)
  Problem: Education: Goal: Knowledge of General Education information will improve Description: Including pain rating scale, medication(s)/side effects and non-pharmacologic comfort measures Outcome: Progressing   Problem: Clinical Measurements: Goal: Ability to maintain clinical measurements within normal limits will improve Outcome: Progressing   Problem: Nutrition: Goal: Adequate nutrition will be maintained Outcome: Progressing   Problem: Elimination: Goal: Will not experience complications related to bowel motility Outcome: Progressing Goal: Will not experience complications related to urinary retention Outcome: Progressing
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
## 2023-01-29 DIAGNOSIS — S065XAA Traumatic subdural hemorrhage with loss of consciousness status unknown, initial encounter: Secondary | ICD-10-CM | POA: Diagnosis not present

## 2023-01-29 DIAGNOSIS — E8729 Other acidosis: Secondary | ICD-10-CM | POA: Diagnosis not present

## 2023-01-29 DIAGNOSIS — E872 Acidosis, unspecified: Secondary | ICD-10-CM | POA: Diagnosis not present

## 2023-01-29 DIAGNOSIS — R7881 Bacteremia: Secondary | ICD-10-CM | POA: Diagnosis not present

## 2023-01-29 NOTE — Plan of Care (Signed)
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
## 2023-01-30 DIAGNOSIS — E872 Acidosis, unspecified: Secondary | ICD-10-CM | POA: Diagnosis not present

## 2023-01-30 DIAGNOSIS — E8729 Other acidosis: Secondary | ICD-10-CM | POA: Diagnosis not present

## 2023-01-30 DIAGNOSIS — R7881 Bacteremia: Secondary | ICD-10-CM | POA: Diagnosis not present

## 2023-01-30 DIAGNOSIS — S065XAA Traumatic subdural hemorrhage with loss of consciousness status unknown, initial encounter: Secondary | ICD-10-CM | POA: Diagnosis not present

## 2023-01-30 LAB — CULTURE, BLOOD (ROUTINE X 2)
Culture: NO GROWTH
Culture: NO GROWTH
Special Requests: ADEQUATE

## 2023-01-30 NOTE — Plan of Care (Signed)
Problem: Education: Goal: Knowledge of General Education information will improve Description: Including pain rating scale, medication(s)/side effects and non-pharmacologic comfort measures Outcome: Progressing   Problem: Health Behavior/Discharge Planning: Goal: Ability to manage health-related needs will improve Outcome: Progressing   Problem: Clinical Measurements: Goal: Ability to maintain clinical measurements within normal limits will improve Outcome: Progressing Goal: Will remain free from infection Outcome: Progressing Goal: Diagnostic test results will improve Outcome: Progressing Goal: Respiratory complications will improve Outcome: Progressing Goal: Cardiovascular complication will be avoided Outcome: Progressing   Problem: Activity: Goal: Risk for activity intolerance will decrease Outcome: Progressing   Problem: Nutrition: Goal: Adequate nutrition will be maintained Outcome: Progressing   Problem: Coping: Goal: Level of anxiety will decrease Outcome: Progressing   Problem: Elimination: Goal: Will not experience complications related to bowel motility Outcome: Progressing Goal: Will not experience complications related to urinary retention Outcome: Progressing   Problem: Pain Managment: Goal: General experience of comfort will improve Outcome: Progressing   Problem: Safety: Goal: Ability to remain free from injury will improve Outcome: Progressing   Problem: Skin Integrity: Goal: Risk for impaired skin integrity will decrease Outcome: Progressing   Problem: Education: Goal: Knowledge of disease or condition will improve Outcome: Progressing Goal: Understanding of discharge needs will improve Outcome: Progressing   Problem: Health Behavior/Discharge Planning: Goal: Ability to identify changes in lifestyle to reduce recurrence of condition will improve Outcome: Progressing Goal: Identification of resources available to assist in meeting health  care needs will improve Outcome: Progressing   Problem: Physical Regulation: Goal: Complications related to the disease process, condition or treatment will be avoided or minimized Outcome: Progressing   Problem: Safety: Goal: Ability to remain free from injury will improve Outcome: Progressing   Problem: Education: Goal: Knowledge of General Education information will improve Description: Including pain rating scale, medication(s)/side effects and non-pharmacologic comfort measures Outcome: Progressing   Problem: Health Behavior/Discharge Planning: Goal: Ability to manage health-related needs will improve Outcome: Progressing   Problem: Clinical Measurements: Goal: Ability to maintain clinical measurements within normal limits will improve Outcome: Progressing   Problem: Clinical Measurements: Goal: Will remain free from infection Outcome: Progressing   Problem: Clinical Measurements: Goal: Diagnostic test results will improve Outcome: Progressing   Problem: Clinical Measurements: Goal: Respiratory complications will improve Outcome: Progressing   Problem: Clinical Measurements: Goal: Cardiovascular complication will be avoided Outcome: Progressing   Problem: Activity: Goal: Risk for activity intolerance will decrease Outcome: Progressing   Problem: Nutrition: Goal: Adequate nutrition will be maintained Outcome: Progressing   Problem: Nutrition: Goal: Adequate nutrition will be maintained Outcome: Progressing   Problem: Coping: Goal: Level of anxiety will decrease Outcome: Progressing   Problem: Elimination: Goal: Will not experience complications related to bowel motility Outcome: Progressing   Problem: Elimination: Goal: Will not experience complications related to urinary retention Outcome: Progressing   Problem: Education: Goal: Knowledge of disease or condition will improve Outcome: Progressing   Problem: Education: Goal: Understanding of  discharge needs will improve Outcome: Progressing   Problem: Health Behavior/Discharge Planning: Goal: Ability to identify changes in lifestyle to reduce recurrence of condition will improve Outcome: Progressing   Problem: Health Behavior/Discharge Planning: Goal: Identification of resources available to assist in meeting health care needs will improve Outcome: Progressing   Problem: Pain Managment: Goal: General experience of comfort will improve Outcome: Progressing   Problem: Safety: Goal: Ability to remain free from injury will improve Outcome: Progressing   Problem: Skin Integrity: Goal: Risk for impaired skin integrity will decrease Outcome: Progressing  Problem: Safety: Goal: Ability to remain free from injury will improve Outcome: Progressing   Problem: Health Behavior/Discharge Planning: Goal: Identification of resources available to assist in meeting health care needs will improve Outcome: Progressing

## 2023-01-30 NOTE — Progress Notes (Signed)
Inpatient Rehab Admissions Coordinator:    [facility] working on SNF. I am in agreement with this plan as he has been to CIR 2x this year and it does not appear family has been able to provide adequate supervision as he continues to have falls. I will not pursue Pt. For CIR.   Megan Salon, MS, CCC-SLP Rehab Admissions Coordinator  [phone] (celll) [phone] (office)

## 2023-01-30 NOTE — Plan of Care (Signed)

## 2023-01-30 NOTE — TOC Progression Note (Signed)
Transition of Care [facility]) - Progression Note    Patient Details  Name: DAMEIAN LOZO MRN: [id number] Date of Birth: 26-Sep-1960  Transition of Care [facility]) CM/SW Contact  Erin Sons, [name] Phone Number: 01/30/2023, 1:53 PM  Clinical Narrative:     CSW called and left message with Heloise Beecham.   CSW called pt's spouse and provided SNF bed offers and medicare star ratings verbally. She chooses [facility]. She has concern about copays. CSW contacted [facility] liaison to confirm bed offer and inquire about pt's commercial insurance coverage; Awaiting response.   Expected Discharge Plan: Skilled Nursing Facility Barriers to Discharge: Insurance Authorization  Expected Discharge Plan and Services In-house Referral: Clinical Social Work     Living arrangements for the past 2 months: Single Family Home                                       Social Determinants of Health (SDOH) Interventions SDOH Screenings   Housing: Patient Unable To Answer (01/23/2023)  Depression (PHQ2-9): Low Risk  (02/01/2021)  Tobacco Use: High Risk (01/23/2023)    Readmission Risk Interventions     No data to display
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
## 2023-01-31 ENCOUNTER — Inpatient Hospital Stay (HOSPITAL_COMMUNITY): Payer: BC Managed Care – PPO

## 2023-01-31 DIAGNOSIS — E8729 Other acidosis: Secondary | ICD-10-CM | POA: Diagnosis not present

## 2023-01-31 DIAGNOSIS — E872 Acidosis, unspecified: Secondary | ICD-10-CM | POA: Diagnosis not present

## 2023-01-31 DIAGNOSIS — S065XAA Traumatic subdural hemorrhage with loss of consciousness status unknown, initial encounter: Secondary | ICD-10-CM | POA: Diagnosis not present

## 2023-01-31 DIAGNOSIS — R7881 Bacteremia: Secondary | ICD-10-CM | POA: Diagnosis not present

## 2023-01-31 NOTE — Plan of Care (Signed)
Problem: Education: Goal: Knowledge of General Education information will improve Description: Including pain rating scale, medication(s)/side effects and non-pharmacologic comfort measures Outcome: Progressing   Problem: Health Behavior/Discharge Planning: Goal: Ability to manage health-related needs will improve Outcome: Progressing   Problem: Clinical Measurements: Goal: Ability to maintain clinical measurements within normal limits will improve Outcome: Progressing Goal: Will remain free from infection Outcome: Progressing Goal: Diagnostic test results will improve Outcome: Progressing Goal: Respiratory complications will improve Outcome: Progressing Goal: Cardiovascular complication will be avoided Outcome: Progressing   Problem: Activity: Goal: Risk for activity intolerance will decrease Outcome: Progressing   Problem: Nutrition: Goal: Adequate nutrition will be maintained Outcome: Progressing   Problem: Coping: Goal: Level of anxiety will decrease Outcome: Progressing   Problem: Elimination: Goal: Will not experience complications related to bowel motility Outcome: Progressing Goal: Will not experience complications related to urinary retention Outcome: Progressing   Problem: Pain Managment: Goal: General experience of comfort will improve Outcome: Progressing   Problem: Safety: Goal: Ability to remain free from injury will improve Outcome: Progressing   Problem: Skin Integrity: Goal: Risk for impaired skin integrity will decrease Outcome: Progressing   Problem: Education: Goal: Knowledge of disease or condition will improve Outcome: Progressing Goal: Understanding of discharge needs will improve Outcome: Progressing   Problem: Health Behavior/Discharge Planning: Goal: Ability to identify changes in lifestyle to reduce recurrence of condition will improve Outcome: Progressing Goal: Identification of resources available to assist in meeting health  care needs will improve Outcome: Progressing   Problem: Physical Regulation: Goal: Complications related to the disease process, condition or treatment will be avoided or minimized Outcome: Progressing   Problem: Safety: Goal: Ability to remain free from injury will improve Outcome: Progressing   Problem: Education: Goal: Knowledge of General Education information will improve Description: Including pain rating scale, medication(s)/side effects and non-pharmacologic comfort measures Outcome: Progressing   Problem: Health Behavior/Discharge Planning: Goal: Ability to manage health-related needs will improve Outcome: Progressing   Problem: Clinical Measurements: Goal: Ability to maintain clinical measurements within normal limits will improve Outcome: Progressing   Problem: Clinical Measurements: Goal: Will remain free from infection Outcome: Progressing   Problem: Clinical Measurements: Goal: Diagnostic test results will improve Outcome: Progressing   Problem: Clinical Measurements: Goal: Respiratory complications will improve Outcome: Progressing   Problem: Clinical Measurements: Goal: Cardiovascular complication will be avoided Outcome: Progressing   Problem: Activity: Goal: Risk for activity intolerance will decrease Outcome: Progressing   Problem: Nutrition: Goal: Adequate nutrition will be maintained Outcome: Progressing   Problem: Coping: Goal: Level of anxiety will decrease Outcome: Progressing   Problem: Elimination: Goal: Will not experience complications related to bowel motility Outcome: Progressing   Problem: Elimination: Goal: Will not experience complications related to urinary retention Outcome: Progressing   Problem: Pain Managment: Goal: General experience of comfort will improve Outcome: Progressing   Problem: Safety: Goal: Ability to remain free from injury will improve Outcome: Progressing   Problem: Skin Integrity: Goal: Risk  for impaired skin integrity will decrease Outcome: Progressing   Problem: Education: Goal: Knowledge of disease or condition will improve Outcome: Progressing   Problem: Education: Goal: Understanding of discharge needs will improve Outcome: Progressing   Problem: Health Behavior/Discharge Planning: Goal: Ability to identify changes in lifestyle to reduce recurrence of condition will improve Outcome: Progressing   Problem: Health Behavior/Discharge Planning: Goal: Identification of resources available to assist in meeting health care needs will improve Outcome: Progressing   Problem: Physical Regulation: Goal: Complications related to the disease process, condition  or treatment will be avoided or minimized Outcome: Progressing   Problem: Safety: Goal: Ability to remain free from injury will improve Outcome: Progressing

## 2023-01-31 NOTE — TOC Progression Note (Signed)
Transition of Care [facility]) - Progression Note    Patient Details  Name: Richard Andrews MRN: [id number] Date of Birth: [birth year]  Transition of [facility]) CM/SW Contact  Erin Sons, [name] Phone Number: 01/31/2023, 2:04 PM  Clinical Narrative:     CSW informed by [facility] liaison that they are not in network with pt's [employer]BS. CSW called and updated spouse. She alternatively chooses [facility]. CSW contact [facility] admissions; they will run pt's insurance to see if in network/benefits.   Expected Discharge Plan: Skilled Nursing Facility Barriers to Discharge: Insurance Authorization  Expected Discharge Plan and Services In-house Referral: Clinical Social Work     Living arrangements for the past 2 months: Single Family Home                                       Social Determinants of Health (SDOH) Interventions SDOH Screenings   Food Insecurity: Patient Declined (01/31/2023)  Housing: Patient Declined (01/31/2023)  Transportation Needs: Patient Declined (01/31/2023)  Utilities: Patient Declined (01/31/2023)  Depression (PHQ2-9): Low Risk  (02/01/2021)  Tobacco Use: High Risk (01/23/2023)    Readmission Risk Interventions     No data to display
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
## 2023-02-01 DIAGNOSIS — R7881 Bacteremia: Secondary | ICD-10-CM | POA: Diagnosis not present

## 2023-02-01 DIAGNOSIS — S065XAA Traumatic subdural hemorrhage with loss of consciousness status unknown, initial encounter: Secondary | ICD-10-CM | POA: Diagnosis not present

## 2023-02-01 DIAGNOSIS — E8729 Other acidosis: Secondary | ICD-10-CM | POA: Diagnosis not present

## 2023-02-01 DIAGNOSIS — E872 Acidosis, unspecified: Secondary | ICD-10-CM | POA: Diagnosis not present

## 2023-02-01 NOTE — Plan of Care (Signed)
Problem: Education: Goal: Knowledge of General Education information will improve Description: Including pain rating scale, medication(s)/side effects and non-pharmacologic comfort measures Outcome: Progressing   Problem: Health Behavior/Discharge Planning: Goal: Ability to manage health-related needs will improve Outcome: Progressing   Problem: Clinical Measurements: Goal: Ability to maintain clinical measurements within normal limits will improve Outcome: Progressing Goal: Will remain free from infection Outcome: Progressing Goal: Diagnostic test results will improve Outcome: Progressing Goal: Respiratory complications will improve Outcome: Progressing Goal: Cardiovascular complication will be avoided Outcome: Progressing   Problem: Activity: Goal: Risk for activity intolerance will decrease Outcome: Progressing   Problem: Nutrition: Goal: Adequate nutrition will be maintained Outcome: Progressing   Problem: Coping: Goal: Level of anxiety will decrease Outcome: Progressing   Problem: Elimination: Goal: Will not experience complications related to bowel motility Outcome: Progressing Goal: Will not experience complications related to urinary retention Outcome: Progressing   Problem: Pain Managment: Goal: General experience of comfort will improve Outcome: Progressing   Problem: Safety: Goal: Ability to remain free from injury will improve Outcome: Progressing   Problem: Skin Integrity: Goal: Risk for impaired skin integrity will decrease Outcome: Progressing   Problem: Education: Goal: Knowledge of disease or condition will improve Outcome: Progressing Goal: Understanding of discharge needs will improve Outcome: Progressing   Problem: Health Behavior/Discharge Planning: Goal: Ability to identify changes in lifestyle to reduce recurrence of condition will improve Outcome: Progressing Goal: Identification of resources available to assist in meeting health  care needs will improve Outcome: Progressing   Problem: Physical Regulation: Goal: Complications related to the disease process, condition or treatment will be avoided or minimized Outcome: Progressing   Problem: Safety: Goal: Ability to remain free from injury will improve Outcome: Progressing   Problem: Education: Goal: Knowledge of General Education information will improve Description: Including pain rating scale, medication(s)/side effects and non-pharmacologic comfort measures Outcome: Progressing   Problem: Health Behavior/Discharge Planning: Goal: Ability to manage health-related needs will improve Outcome: Progressing   Problem: Clinical Measurements: Goal: Ability to maintain clinical measurements within normal limits will improve Outcome: Progressing   Problem: Clinical Measurements: Goal: Will remain free from infection Outcome: Progressing   Problem: Clinical Measurements: Goal: Diagnostic test results will improve Outcome: Progressing   Problem: Clinical Measurements: Goal: Respiratory complications will improve Outcome: Progressing   Problem: Clinical Measurements: Goal: Cardiovascular complication will be avoided Outcome: Progressing   Problem: Activity: Goal: Risk for activity intolerance will decrease Outcome: Progressing   Problem: Nutrition: Goal: Adequate nutrition will be maintained Outcome: Progressing   Problem: Coping: Goal: Level of anxiety will decrease Outcome: Progressing   Problem: Elimination: Goal: Will not experience complications related to bowel motility Outcome: Progressing   Problem: Elimination: Goal: Will not experience complications related to urinary retention Outcome: Progressing   Problem: Pain Managment: Goal: General experience of comfort will improve Outcome: Progressing   Problem: Skin Integrity: Goal: Risk for impaired skin integrity will decrease Outcome: Progressing   Problem: Education: Goal:  Knowledge of disease or condition will improve Outcome: Progressing   Problem: Education: Goal: Understanding of discharge needs will improve Outcome: Progressing   Problem: Health Behavior/Discharge Planning: Goal: Ability to identify changes in lifestyle to reduce recurrence of condition will improve Outcome: Progressing   Problem: Health Behavior/Discharge Planning: Goal: Identification of resources available to assist in meeting health care needs will improve Outcome: Progressing   Problem: Physical Regulation: Goal: Complications related to the disease process, condition or treatment will be avoided or minimized Outcome: Progressing   Problem: Safety: Goal: Ability  to remain free from injury will improve Outcome: Progressing
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
## 2023-02-01 NOTE — TOC Progression Note (Signed)
Transition of Care [facility]) - Progression Note    Patient Details  Name: Richard Andrews MRN: [id number] Date of Birth: [birth year]  Transition of Care [facility]) CM/SW Contact  Erin Sons, [name] Phone Number: 02/01/2023, 3:46 PM  Clinical Narrative:     [employer] informed that [facility] is not in network with pt's insurance. CSW contacted liaison for [facility] and [facility] to inquire if they are in network before providing bed offers to spouse. [facility] and [facility] will notify CSW if they are in network.   1515: CSW informed that pt is in network with [facility] and [facility]. CSW called and notified spouse of bed offers and medicare star ratings. She chooses [facility]. [facility] is starting [facility].   Expected Discharge Plan: Skilled Nursing Facility Barriers to Discharge: Insurance Authorization  Expected Discharge Plan and Services In-house Referral: Clinical Social Work     Living arrangements for the past 2 months: Single Family Home                                       Social Determinants of Health (SDOH) Interventions SDOH Screenings   Food Insecurity: Patient Declined (01/31/2023)  Housing: Patient Declined (01/31/2023)  Transportation Needs: Patient Declined (01/31/2023)  Utilities: Patient Declined (01/31/2023)  Depression (PHQ2-9): Low Risk  (02/01/2021)  Tobacco Use: High Risk (01/23/2023)    Readmission Risk Interventions     No data to display

## 2023-02-02 ENCOUNTER — Other Ambulatory Visit (HOSPITAL_COMMUNITY): Payer: Self-pay

## 2023-02-02 DIAGNOSIS — I10 Essential (primary) hypertension: Secondary | ICD-10-CM | POA: Diagnosis not present

## 2023-02-02 DIAGNOSIS — B952 Enterococcus as the cause of diseases classified elsewhere: Secondary | ICD-10-CM | POA: Diagnosis not present

## 2023-02-02 DIAGNOSIS — F1020 Alcohol dependence, uncomplicated: Secondary | ICD-10-CM | POA: Diagnosis not present

## 2023-02-02 DIAGNOSIS — S065XAA Traumatic subdural hemorrhage with loss of consciousness status unknown, initial encounter: Secondary | ICD-10-CM | POA: Diagnosis not present

## 2023-02-02 DIAGNOSIS — R7881 Bacteremia: Secondary | ICD-10-CM | POA: Diagnosis not present

## 2023-02-02 MED ORDER — VITAMIN B-1 100 MG PO TABS: 100.0000 mg | ORAL_TABLET | Freq: Every day | ORAL | Status: DC

## 2023-02-02 MED ORDER — ACETAMINOPHEN 325 MG PO TABS: 650.0000 mg | ORAL_TABLET | Freq: Four times a day (QID) | ORAL | Status: DC | PRN

## 2023-02-02 NOTE — Discharge Summary (Addendum)
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 cerebellum are within normal limits. Midline structures are within normal limits. Vascular: No hyperdense vessel or unexpected calcification. Skull: Calvarium is intact. No focal lytic or blastic lesions are present. No significant extracranial soft tissue lesion is present. Sinuses/Orbits: The paranasal sinuses and mastoid air cells are clear. The globes and orbits are within normal limits. IMPRESSION: 1. New more anterior extra-axial collection, consistent with a new subdural hematoma with layering blood products measuring approximately 9 mm. 2. The new hemorrhage creates some local mass effect with partial effacement sulci. However the previously seen midline shift has resolved. 3. The previously seen subdural hematoma over the right frontal and parietal convexity has decreased in size. No residual midline shift is present. 4. No acute cortical infarct. These  results were called by telephone at the time of interpretation on 01/22/2023 at 4:40 pm to provider Dr. Edwin Dada , who verbally acknowledged these results. Electronically Signed   By: Marin Roberts M.D.   On: 01/22/2023 16:40     TODAY-DAY OF DISCHARGE:  Subjective:   Caprice Red today has no headache,no chest abdominal pain,no new weakness tingling or numbness, feels much better wants to go home today.   Objective:   Blood pressure (!) 146/85, pulse (!) 56, temperature 98.2 F (36.8 C), temperature source Oral, resp. rate 13, height 5\' 11"  (1.803 m), weight 52.5 kg, SpO2 95%.  Intake/Output Summary (Last 24 hours) at 02/05/2023 1055 Last data filed at 02/04/2023 1440 Gross per 24 hour  Intake --  Output 450 ml  Net -450 ml   Filed Weights   01/23/23 0823 01/28/23 0403 02/04/23 1632  Weight: 57.2 kg 5.443 kg 52.5 kg    Exam: Awake Alert, Oriented *3, No new F.N deficits, Normal affect [place].AT,PERRAL Supple Neck,No JVD, No cervical lymphadenopathy appriciated.  Symmetrical Chest wall movement, Good air movement bilaterally, CTAB RRR,No Gallops,Rubs or new Murmurs, No Parasternal Heave +ve B.Sounds, Abd Soft, Non tender, No organomegaly appriciated, No rebound -guarding or rigidity. No Cyanosis, Clubbing or edema, No new Rash or bruise   PERTINENT RADIOLOGIC STUDIES: No results found.   PERTINENT LAB RESULTS: CBC: Recent Labs    02/04/23 0708 02/05/23 0419  WBC 3.0* 3.1*  HGB 9.9* 10.5*  HCT 29.2* 31.2*  PLT 144* 151   CMET CMP     Component Value Date/Time   NA 134 (L) 02/05/2023 0419   K 4.3 02/05/2023 0419   CL 103 02/05/2023 0419   CO2 25 02/05/2023 0419   GLUCOSE 98 02/05/2023 0419   BUN 8 02/05/2023 0419   CREATININE 0.87 02/05/2023 0419   CREATININE 0.99 02/01/2021 0000   CALCIUM 9.3 02/05/2023 0419   PROT 6.0 (L) 01/25/2023 0233   ALBUMIN 2.3 (L) 01/25/2023 0233   AST 43 (H) 01/25/2023 0233   ALT 17 01/25/2023 0233   ALKPHOS 61 01/25/2023  0233   BILITOT 0.6 01/25/2023 0233   EGFR 87 02/01/2021 0000   GFRNONAA >60 02/05/2023 0419   GFRNONAA 90 02/06/2020 0000    GFR Estimated Creatinine Clearance: 64.5 mL/min (by C-G formula based on SCr of 0.87 mg/dL). No results for input(s): "LIPASE", "AMYLASE" in the last 72 hours. No results for input(s): "CKTOTAL", "CKMB", "CKMBINDEX", "TROPONINI" in the last 72 hours. Invalid input(s): "POCBNP" No  results for input(s): "DDIMER" in the last 72 hours. No results for input(s): "HGBA1C" in the last 72 hours. No results for input(s): "CHOL", "HDL", "LDLCALC", "TRIG", "CHOLHDL", "LDLDIRECT" in the last 72 hours. No results for input(s): "TSH", "T4TOTAL", "T3FREE", "THYROIDAB" in the last 72 hours.  Invalid input(s): "FREET3" No results for input(s): "VITAMINB12", "FOLATE", "FERRITIN", "TIBC", "IRON", "RETICCTPCT" in the last 72 hours. Coags: No results for input(s): "INR" in the last 72 hours.  Invalid input(s): "PT" Microbiology: No results found for this or any previous visit (from the past 240 hour(s)).   FURTHER DISCHARGE INSTRUCTIONS:  Get Medicines reviewed and adjusted: Please take all your medications with you for your next visit with your Primary MD  Laboratory/radiological data: Please request your Primary MD to go over all hospital tests and procedure/radiological results at the follow up, please ask your Primary MD to get all Hospital records sent to his/her office.  In some cases, they will be blood work, cultures and biopsy results pending at the time of your discharge. Please request that your primary care M.D. goes through all the records of your hospital data and follows up on these results.  Also Note the following: If you experience worsening of your admission symptoms, develop shortness of breath, life threatening emergency, suicidal or homicidal thoughts you must seek medical attention immediately by calling 911 or calling your MD immediately  if symptoms  less severe.  You must read complete instructions/literature along with all the possible adverse reactions/side effects for all the Medicines you take and that have been prescribed to you. Take any new Medicines after you have completely understood and accpet all the possible adverse reactions/side effects.   Do not drive when taking Pain medications or sleeping medications (Benzodaizepines)  Do not take more than prescribed Pain, Sleep and Anxiety Medications. It is not advisable to combine anxiety,sleep and pain medications without talking with your primary care practitioner  Special Instructions: If you have smoked or chewed Tobacco  in the last 2 yrs please stop smoking, stop any regular Alcohol  and or any Recreational drug use.  Wear Seat belts while driving.  Please note: You were cared for by a hospitalist during your hospital stay. Once you are discharged, your primary care physician will handle any further medical issues. Please note that NO REFILLS for any discharge medications will be authorized once you are discharged, as it is imperative that you return to your primary care physician (or establish a relationship with a primary care physician if you do not have one) for your post hospital discharge needs so that they can reassess your need for medications and monitor your lab values.  Total Time spent coordinating discharge including counseling, education and face to face time equals greater than 30 minutes.  Signed: Susa Raring 02/05/2023 10:55 AM
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
## 2023-02-02 NOTE — Plan of Care (Signed)
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
## 2023-02-02 NOTE — TOC Progression Note (Addendum)
Transition of Care [facility]) - Progression Note    Patient Details  Name: Richard Andrews MRN: [id number] Date of Birth: 05-05-1961  Transition of Care [facility] CM/SW Contact  Erin Sons, [name] Phone Number: 02/02/2023, 1:22 PM  Clinical Narrative:     CSW contacted Tammy with [facility]; SNF [facility] is still pending.   1618: Auth still pending  Expected Discharge Plan: Skilled Nursing Facility Barriers to Discharge: Insurance Authorization  Expected Discharge Plan and Services In-house Referral: Clinical Social Work     Living arrangements for the past 2 months: Single Family Home                                       Social Determinants of Health (SDOH) Interventions SDOH Screenings   Food Insecurity: Patient Declined (01/31/2023)  Housing: Patient Declined (01/31/2023)  Transportation Needs: Patient Declined (01/31/2023)  Utilities: Patient Declined (01/31/2023)  Depression (PHQ2-9): Low Risk  (02/01/2021)  Tobacco Use: High Risk (01/23/2023)    Readmission Risk Interventions     No data to display

## 2023-02-03 ENCOUNTER — Other Ambulatory Visit (HOSPITAL_COMMUNITY): Payer: Self-pay

## 2023-02-03 ENCOUNTER — Ambulatory Visit: Payer: BC Managed Care – PPO | Admitting: Medical-Surgical

## 2023-02-03 DIAGNOSIS — B952 Enterococcus as the cause of diseases classified elsewhere: Secondary | ICD-10-CM | POA: Diagnosis not present

## 2023-02-03 DIAGNOSIS — R7881 Bacteremia: Secondary | ICD-10-CM | POA: Diagnosis not present

## 2023-02-03 NOTE — Plan of Care (Signed)

## 2023-02-03 NOTE — Plan of Care (Signed)

## 2023-02-03 NOTE — TOC Progression Note (Signed)
Transition of Care [facility]) - Progression Note    Patient Details  Name: Richard Andrews MRN: [id number] Date of Birth: 1960/08/18  Transition of Care [facility]) CM/SW Contact  Erin Sons, [name] Phone Number: 02/03/2023, 2:41 PM  Clinical Narrative:     Insurance [facility] is still pending at this time. TOC will continue to follow.   Expected Discharge Plan: Skilled Nursing Facility Barriers to Discharge: Insurance Authorization  Expected Discharge Plan and Services In-house Referral: Clinical Social Work     Living arrangements for the past 2 months: Single Family Home                                       Social Determinants of Health (SDOH) Interventions SDOH Screenings   Food Insecurity: Patient Declined (01/31/2023)  Housing: Patient Declined (01/31/2023)  Transportation Needs: Patient Declined (01/31/2023)  Utilities: Patient Declined (01/31/2023)  Depression (PHQ2-9): Low Risk  (02/01/2021)  Tobacco Use: High Risk (01/23/2023)    Readmission Risk Interventions     No data to display
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
## 2023-02-04 DIAGNOSIS — B952 Enterococcus as the cause of diseases classified elsewhere: Secondary | ICD-10-CM | POA: Diagnosis not present

## 2023-02-04 DIAGNOSIS — R7881 Bacteremia: Secondary | ICD-10-CM | POA: Diagnosis not present

## 2023-02-04 LAB — BASIC METABOLIC PANEL
Anion gap: 13 (ref 5–15)
BUN: 6 mg/dL — ABNORMAL LOW (ref 8–23)
CO2: 23 mmol/L (ref 22–32)
Calcium: 8.7 mg/dL — ABNORMAL LOW (ref 8.9–10.3)
Chloride: 97 mmol/L — ABNORMAL LOW (ref 98–111)
Creatinine, Ser: 1.01 mg/dL (ref 0.61–1.24)
GFR, Estimated: >60 mL/min (ref >60)
Glucose, Bld: 94 mg/dL (ref 70–99)
Potassium: 3.1 mmol/L — ABNORMAL LOW (ref 3.5–5.1)
Sodium: 133 mmol/L — ABNORMAL LOW (ref 135–145)

## 2023-02-04 LAB — CBC
HCT: 29.2 % — ABNORMAL LOW (ref 39.0–52.0)
Hemoglobin: 9.9 g/dL — ABNORMAL LOW (ref 13.0–17.0)
MCH: 33.3 pg (ref 26.0–34.0)
MCHC: 33.9 g/dL (ref 30.0–36.0)
MCV: 98.3 fL (ref 80.0–100.0)
Platelets: 144 10*3/uL — ABNORMAL LOW (ref 150–400)
RBC: 2.97 MIL/uL — ABNORMAL LOW (ref 4.22–5.81)
RDW: 13.9 % (ref 11.5–15.5)
WBC: 3 10*3/uL — ABNORMAL LOW (ref 4.0–10.5)
nRBC: 0 % (ref 0.0–0.2)

## 2023-02-04 MED ORDER — POTASSIUM CHLORIDE CRYS ER 20 MEQ PO TBCR
40.0000 meq | EXTENDED_RELEASE_TABLET | Freq: Four times a day (QID) | ORAL | Status: AC
  Administered 2023-02-04 (×2): 40 meq via ORAL
  Filled 2023-02-04: qty 4
  Filled 2023-02-04: qty 2

## 2023-02-04 NOTE — Plan of Care (Signed)
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
## 2023-02-05 LAB — CBC WITH DIFFERENTIAL/PLATELET
Abs Immature Granulocytes: 0.01 10*3/uL (ref 0.00–0.07)
Basophils Absolute: 0 10*3/uL (ref 0.0–0.1)
Basophils Relative: 1 %
Eosinophils Absolute: 0.1 10*3/uL (ref 0.0–0.5)
Eosinophils Relative: 3 %
HCT: 31.2 % — ABNORMAL LOW (ref 39.0–52.0)
Hemoglobin: 10.5 g/dL — ABNORMAL LOW (ref 13.0–17.0)
Immature Granulocytes: 0 %
Lymphocytes Relative: 31 %
Lymphs Abs: 1 10*3/uL (ref 0.7–4.0)
MCH: 33.3 pg (ref 26.0–34.0)
MCHC: 33.7 g/dL (ref 30.0–36.0)
MCV: 99 fL (ref 80.0–100.0)
Monocytes Absolute: 0.5 10*3/uL (ref 0.1–1.0)
Monocytes Relative: 15 %
Neutro Abs: 1.6 10*3/uL — ABNORMAL LOW (ref 1.7–7.7)
Neutrophils Relative %: 50 %
Platelets: 151 10*3/uL (ref 150–400)
RBC: 3.15 MIL/uL — ABNORMAL LOW (ref 4.22–5.81)
RDW: 14.2 % (ref 11.5–15.5)
WBC: 3.1 10*3/uL — ABNORMAL LOW (ref 4.0–10.5)
nRBC: 0 % (ref 0.0–0.2)

## 2023-02-05 LAB — BASIC METABOLIC PANEL
Anion gap: 6 (ref 5–15)
BUN: 8 mg/dL (ref 8–23)
CO2: 25 mmol/L (ref 22–32)
Calcium: 9.3 mg/dL (ref 8.9–10.3)
Chloride: 103 mmol/L (ref 98–111)
Creatinine, Ser: 0.87 mg/dL (ref 0.61–1.24)
GFR, Estimated: >60 mL/min (ref >60)
Glucose, Bld: 98 mg/dL (ref 70–99)
Potassium: 4.3 mmol/L (ref 3.5–5.1)
Sodium: 134 mmol/L — ABNORMAL LOW (ref 135–145)

## 2023-02-05 LAB — MAGNESIUM: Magnesium: 1.6 mg/dL — ABNORMAL LOW (ref 1.7–2.4)

## 2023-02-05 MED ORDER — MAGNESIUM SULFATE 4 GM/100ML IV SOLN
4.0000 g | Freq: Once | INTRAVENOUS | Status: AC
  Administered 2023-02-05: 4 g via INTRAVENOUS
  Filled 2023-02-05: qty 100

## 2023-02-05 NOTE — Plan of Care (Signed)

## 2023-02-05 NOTE — Plan of Care (Signed)

## 2023-02-05 NOTE — Discharge Instructions (Signed)
  Follow with Primary MD [name], NP in 7 days, must follow-up with his urologist within the next 7 to 10 days.  Get CBC, CMP, 2 view Chest X ray -  checked next visit with your primary MD or SNF MD    Activity: As tolerated with Full fall precautions use walker/cane & assistance as needed  Disposition SNF  Diet: Heart Healthy carbohydrate diet, check CBGs q. [facility]  Special Instructions: If you have smoked or chewed Tobacco  in the last 2 yrs please stop smoking, stop any regular Alcohol  and or any Recreational drug use.  On your next visit with your primary care physician please Get Medicines reviewed and adjusted.  Please request your Prim.MD to go over all Hospital Tests and Procedure/Radiological results at the follow up, please get all Hospital records sent to your Prim MD by signing hospital release before you go home.  If you experience worsening of your admission symptoms, develop shortness of breath, life threatening emergency, suicidal or homicidal thoughts you must seek medical attention immediately by calling 911 or calling your MD immediately  if symptoms less severe.  You Must read complete instructions/literature along with all the possible adverse reactions/side effects for all the Medicines you take and that have been prescribed to you. Take any new Medicines after you have completely understood and accpet all the possible adverse reactions/side effects.

## 2023-02-06 ENCOUNTER — Other Ambulatory Visit (HOSPITAL_COMMUNITY): Payer: Self-pay

## 2023-02-06 DIAGNOSIS — R7881 Bacteremia: Secondary | ICD-10-CM | POA: Diagnosis not present

## 2023-02-06 DIAGNOSIS — B952 Enterococcus as the cause of diseases classified elsewhere: Secondary | ICD-10-CM | POA: Diagnosis not present

## 2023-02-06 LAB — BASIC METABOLIC PANEL
Anion gap: 8 (ref 5–15)
BUN: 9 mg/dL (ref 8–23)
CO2: 26 mmol/L (ref 22–32)
Calcium: 8.9 mg/dL (ref 8.9–10.3)
Chloride: 97 mmol/L — ABNORMAL LOW (ref 98–111)
Creatinine, Ser: 0.84 mg/dL (ref 0.61–1.24)
GFR, Estimated: >60 mL/min (ref >60)
Glucose, Bld: 91 mg/dL (ref 70–99)
Potassium: 3.7 mmol/L (ref 3.5–5.1)
Sodium: 131 mmol/L — ABNORMAL LOW (ref 135–145)

## 2023-02-06 LAB — MAGNESIUM: Magnesium: 1.7 mg/dL (ref 1.7–2.4)

## 2023-02-06 MED ORDER — MAGNESIUM SULFATE 2 GM/50ML IV SOLN: 2.0000 g | Freq: Once | INTRAVENOUS | Status: DC

## 2023-02-06 MED ORDER — MAGNESIUM OXIDE -MG SUPPLEMENT 400 (240 MG) MG PO TABS
800.0000 mg | ORAL_TABLET | Freq: Once | ORAL | Status: AC
  Administered 2023-02-06: 800 mg via ORAL

## 2023-02-06 NOTE — Progress Notes (Signed)
Report called to receiving facility.  Richard Andrews [phone]

## 2023-02-06 NOTE — TOC Transition Note (Signed)
Transition of Care [facility]) - CM/SW Discharge Note   Patient Details  Name: Richard Andrews MRN: [id number] Date of Birth: Sep 03, 1960  Transition of Care [facility]) CM/SW Contact:  Mearl Latin, LCSW Phone Number: 02/06/2023, 12:32 PM   Clinical Narrative:    Patient will DC to: [facility] Anticipated DC date: 02/06/23 Family notified: Spouse Transport by: Sharin Mons   Per MD patient ready for DC to [facility]. RN to call report prior to discharge [phone] room 144p). RN, patient, patient's family, and facility notified of DC. Discharge Summary and FL2 sent to facility. DC packet on chart including signed DNR. Ambulance transport requested for patient.   CSW will sign off for now as social work intervention is no longer needed. Please consult [place] again if new needs arise.     Final next level of care: Skilled Nursing Facility Barriers to Discharge: Barriers Resolved   Patient Goals and CMS Choice CMS Medicare.gov Compare Post Acute Care list provided to:: Patient Represenative (must comment) Choice offered to / list presented to : Adult Children  Discharge Placement PASRR number recieved: 02/06/23 PASRR number recieved: 02/06/23            Patient chooses bed at:  [facility]) Patient to be transferred to facility by: [facility] Name of family member notified: Son Patient and family notified of of transfer: 02/06/23  Discharge Plan and Services Additional resources added to the After Visit Summary for   In-house Referral: Clinical Social Work                                   Social Determinants of Health (SDOH) Interventions SDOH Screenings   Food Insecurity: Patient Declined (01/31/2023)  Housing: Patient Declined (01/31/2023)  Transportation Needs: Patient Declined (01/31/2023)  Utilities: Patient Declined (01/31/2023)  Depression (PHQ2-9): Low Risk  (02/01/2021)  Tobacco Use: High Risk (01/23/2023)     Readmission Risk Interventions     No data to display

## 2023-02-06 NOTE — TOC Progression Note (Addendum)
Transition of Care [facility]) - Progression Note    Patient Details  Name: Richard Andrews MRN: [id number] Date of Birth: [birth year]  Transition of Care [facility]) CM/SW Contact  Mearl Latin, LCSW Phone Number: 02/06/2023, 9:19 AM  Clinical Narrative:    Faythe Casa has received insurance authorization for discharge today. CSW updated patient's spouse and requested [facility] for transport. CSW updated patient as well.   Expected Discharge Plan: Skilled Nursing Facility Barriers to Discharge: Barriers Resolved  Expected Discharge Plan and Services In-house Referral: Clinical Social Work     Living arrangements for the past 2 months: Single Family Home Expected Discharge Date: 02/05/23                                     Social Determinants of Health (SDOH) Interventions SDOH Screenings   Food Insecurity: Patient Declined (01/31/2023)  Housing: Patient Declined (01/31/2023)  Transportation Needs: Patient Declined (01/31/2023)  Utilities: Patient Declined (01/31/2023)  Depression (PHQ2-9): Low Risk  (02/01/2021)  Tobacco Use: High Risk (01/23/2023)    Readmission Risk Interventions     No data to display

## 2023-02-06 NOTE — Progress Notes (Signed)
[facility]                                                                                                                                                                         Patient Demographics  Richard Andrews, is a 62 y.o. male  WGN:[id number]  HQI:[id number]  DOB - 1961/04/25  Admit date - 01/22/2023  Admitting Physician Andris Baumann, MD  Outpatient Primary MD for the patient is [name], NP  LOS - 15   Chief Complaint  Patient presents with   Altered Mental Status        Assessment & Plan    Patient seen briefly today due for discharge soon per Discharge done on 02/05/23 by me, a.m. magnesium level still pending will follow, no further issues, Vital signs stable, patient feels fine.      Medications  Scheduled Meds:  amoxicillin  1,000 mg Oral Q8H   DULoxetine  30 mg Oral Daily   enoxaparin (LOVENOX) injection  40 mg Subcutaneous Q24H   Gerhardt's butt cream   Topical QID   pantoprazole  40 mg Oral Daily   thiamine  100 mg Oral Daily   Or   thiamine  100 mg Intravenous Daily   Continuous Infusions: PRN Meds:.acetaminophen **OR** acetaminophen, HYDROcodone-acetaminophen    Time Spent in minutes   10 minutes   Susa Raring M.D on 02/06/2023 at 9:07 AM  Between 7am to 7pm - Pager - [phone]  After 7pm go to [email or web address] - password [email or web address]  And look for the night coverage person covering for me after hours  [facility] Office  [phone]    Subjective:   [name] today has, No headache, No chest pain, No abdominal pain - No Nausea, No new weakness tingling or numbness, No Cough - SOB.   Objective:   Vitals:   02/05/23 2000 02/05/23 2358 02/06/23 0337 02/06/23 0800  BP:  135/88 134/89 (!) 135/106  Pulse:  64 64 97  Resp:  13 13 16   Temp: 98 F (36.7 C) 97.8 F (36.6 C) 98.5 F (36.9 C)   TempSrc: Oral Oral Oral   SpO2: 94%   90%  Weight:       Height:        Wt Readings from Last 3 Encounters:  02/04/23 52.5 kg  01/06/23 57.2 kg  11/21/22 63.5 kg     Intake/Output Summary (Last 24 hours) at 02/06/2023 0907 Last data filed at 02/06/2023 0647 Gross per 24 hour  Intake --  Output 200 ml  Net -200 ml    Exam  Awake Alert, No new F.N deficits, Normal affect [place].AT,PERRAL Supple Neck, No JVD,   Symmetrical Chest wall movement, Good air movement bilaterally, CTAB  RRR,No Gallops, Rubs or new Murmurs,  +ve B.Sounds, Abd Soft, No tenderness,   No Cyanosis, Clubbing or edema   Data Review

## 2023-02-06 NOTE — NC FL2 (Cosign Needed Addendum)
[place] MEDICAID FL2 LEVEL OF CARE FORM     IDENTIFICATION  Patient Name: Richard Andrews Birthdate: 1960-10-14 Sex: male Admission Date (Current Location): 01/22/2023  [facility] and [place] Number:  Producer, television/film/video and Address:  [facility], [address] [place], [name] [address]      Provider Number: [id number]  Attending Physician Name and Address:  Leroy Sea, MD  Relative Name and Phone Number:       Current Level of Care: Hospital Recommended Level of Care: Skilled Nursing Facility Prior Approval Number:    Date Approved/Denied:   PASRR Number: [id number]  Discharge Plan: SNF    Current Diagnoses: Patient Active Problem List   Diagnosis Date Noted   Colitis due to Clostridioides difficile 01/25/2023   Enterococcal bacteremia 01/24/2023   History of seizure due to alcohol withdrawal 01/23/2023   History of bleeding peptic ulcer 01/23/2023   Hyponatremia 01/22/2023   Lactic acidosis 01/22/2023   Hypokalemia 01/22/2023   Hypomagnesemia 01/22/2023   Physical deconditioning 01/22/2023   Hyperbilirubinemia 01/22/2023   Hepatic steatosis 01/22/2023   SDH (subdural hematoma) (HCC) 01/22/2023   High anion gap metabolic acidosis 01/22/2023   Closed nondisplaced fracture of left tibial plateau 11/21/2022   ICH (intracerebral hemorrhage) (HCC) 11/15/2022   Subdural hematoma (HCC) 10/07/2022   Fall 09/24/2022   Alcohol withdrawal syndrome without complication (HCC)    Gastritis and gastroduodenitis    Multiple gastric ulcers    Duodenal ulcer    Acute blood loss anemia    Alcohol withdrawal seizure with complication (HCC)    GI bleed 01/19/2021   QT prolongation 01/19/2021   Electrolyte imbalance 02/06/2020   Macrocytic anemia 02/06/2020   Acute pain of right wrist 02/06/2020   Encounter for orthopedic follow-up care 01/28/2020   Open Colles' fracture of right radius 01/10/2020   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    Increased ammonia level 12/21/2016   Thrombocytopenia (HCC) 12/21/2016   Macrocytosis 03/21/2016   Elevated lipase 03/21/2016   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   Alcohol use disorder, moderate, dependence (HCC) 02/12/2016   Essential hypertension 01/22/2015    Orientation RESPIRATION BLADDER Height & Weight     Self, Place  Normal Incontinent, External catheter (ureteral stent) Weight: 115 lb 11.9 oz (52.5 kg) Height:  5\' 11"  (180.3 cm)  BEHAVIORAL SYMPTOMS/MOOD NEUROLOGICAL BOWEL NUTRITION STATUS      Continent Diet (See dc summary)  AMBULATORY STATUS COMMUNICATION OF NEEDS Skin   Limited Assist Verbally Other (Comment), Surgical wounds (Closed incision on penis; MASD on buttocks and scrotum; non pressure wound on thigh)                       Personal Care Assistance Level of Assistance  Bathing, Feeding, Dressing Bathing Assistance: Limited assistance Feeding assistance: Limited assistance Dressing Assistance: Limited assistance     Functional Limitations Info  Sight Sight Info: Impaired        SPECIAL CARE FACTORS FREQUENCY  PT (By licensed PT), OT (By licensed OT)     PT Frequency: 5x/week OT Frequency: 5x/week            Contractures Contractures Info: Not present    Additional Factors Info  Code Status, Allergies, Contact Precautions (Enteric) Code Status Info: DNR Allergies Info: NKA           Current Medications (02/06/2023):  This is the current hospital active medication list Current Facility-Administered Medications  Medication Dose Route Frequency Provider  Last Rate Last Admin   acetaminophen (TYLENOL) tablet 650 mg  650 mg Oral Q6H PRN Despina Arias, MD       Or   acetaminophen (TYLENOL) suppository 650 mg  650 mg Rectal Q6H PRN Despina Arias, MD   650 mg at 01/23/23 0826   amoxicillin (AMOXIL) capsule 1,000 mg  1,000 mg Oral Q8H Dixon, Gomez Cleverly, NP   1,000 mg at 02/06/23 1610   DULoxetine (CYMBALTA) DR capsule 30 mg  30 mg Oral Daily  Despina Arias, MD   30 mg at 02/06/23 0846   enoxaparin (LOVENOX) injection 40 mg  40 mg Subcutaneous Q24H Maretta Bees, MD   40 mg at 02/05/23 1331   Gerhardt's butt cream   Topical QID Maretta Bees, MD   Given at 02/05/23 2201   HYDROcodone-acetaminophen (NORCO/VICODIN) 5-325 MG per tablet 1-2 tablet  1-2 tablet Oral Q4H PRN Despina Arias, MD   1 tablet at 02/01/23 0836   pantoprazole (PROTONIX) EC tablet 40 mg  40 mg Oral Daily Despina Arias, MD   40 mg at 02/06/23 0846   thiamine (VITAMIN B1) tablet 100 mg  100 mg Oral Daily Despina Arias, MD   100 mg at 02/06/23 9604   Or   thiamine (VITAMIN B1) injection 100 mg  100 mg Intravenous Daily Despina Arias, MD         Discharge Medications: Please see discharge summary for a list of discharge medications.  Relevant Imaging Results:  Relevant Lab Results:   Additional Information SSN: [id number] [place], [name]

## 2023-02-06 NOTE — Plan of Care (Signed)
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
## 2023-02-07 ENCOUNTER — Telehealth: Payer: Self-pay

## 2023-02-07 NOTE — Transitions of Care (Post Inpatient/ED Visit) (Unsigned)
   02/07/2023  Name: Richard Andrews MRN: [id number] DOB: 1961/01/08  Today's TOC FU Call Status: Today's TOC FU Call Status:: Unsuccessful Call (1st Attempt) Unsuccessful Call (1st Attempt) Date: 02/07/23  Attempted to reach the patient regarding the most recent Inpatient/ED visit.  Follow Up Plan: Additional outreach attempts will be made to reach the patient to complete the Transitions of Care (Post Inpatient/ED visit) call.   Signature Karena Addison, LPN [facility] Nurse Health Advisor Direct Dial [phone]

## 2023-02-08 NOTE — Transitions of Care (Post Inpatient/ED Visit) (Signed)
   02/08/2023  Name: Richard Andrews MRN: [id number] DOB: [birth year]  Today's TOC FU Call Status: Today's TOC FU Call Status:: Successful TOC FU Call Completed Unsuccessful Call (1st Attempt) Date: 02/07/23 [facility] FU Call Complete Date: 02/08/23 Patient's Name and Date of Birth confirmed.  Transition Care Management Follow-up Telephone Call Date of Discharge: 02/06/23 Discharge Facility: [name]) Type of Discharge: Inpatient Admission Primary Inpatient Discharge Diagnosis:: alcohol use How have you been since you were released from the hospital?: Same Any questions or concerns?: No  Items Reviewed: Did you receive and understand the discharge instructions provided?: Yes Medications obtained,verified, and reconciled?: Yes (Medications Reviewed) Any new allergies since your discharge?: No Dietary orders reviewed?: Yes Do you have support at home?: Yes People in Home: facility resident Name of Support/Comfort Primary Source: patient is at [employer]  Medications Reviewed Today: Medications Reviewed Today     Reviewed by Karena Addison, LPN (Licensed Practical Nurse) on 02/08/23 at 1620  Med List Status: <None>   Medication Order Taking? Sig Documenting Provider Last Dose Status Informant  acetaminophen (TYLENOL) 325 MG tablet [id number]  Take 2 tablets (650 mg total) by mouth every 6 (six) hours as needed for mild pain (or Fever >/= 101). Maretta Bees, MD  Active   amoxicillin (AMOXIL) 500 MG capsule [id number]  Take 2 capsules (1,000 mg total) by mouth 3 (three) times daily for 28 days. Daiva Eves, Lisette Grinder, MD  Active   DULoxetine (CYMBALTA) 30 MG capsule [id number]  Take 1 capsule (30mg ) daily for one week then increase to 1 tablet (30mg ) twice daily. [name], NP  Active Spouse/Significant Other           Med Note Cristobal Goldmann Jan 22, 2023  6:07 PM) Not started  pantoprazole (PROTONIX) 40 MG tablet [id number] No Take 1 tablet (40 mg total) by mouth daily.  Charlton Amor, PA-C Taking Active Spouse/Significant Other           Med Note Cristobal Goldmann Jan 22, 2023  6:08 PM) Not started  thiamine (VITAMIN B-1) 100 MG tablet [id number]  Take 1 tablet (100 mg total) by mouth daily. Maretta Bees, MD  Active             Home Care and Equipment/Supplies: Were Home Health Services Ordered?: NA Any new equipment or medical supplies ordered?: NA  Functional Questionnaire: Do you need assistance with bathing/showering or dressing?: No Do you need assistance with meal preparation?: No Do you need assistance with eating?: No Do you have difficulty maintaining continence: No Do you need assistance with getting out of bed/getting out of a chair/moving?: No Do you have difficulty managing or taking your medications?: No  Follow up appointments reviewed: PCP Follow-up appointment confirmed?: NA Specialist Hospital Follow-up appointment confirmed?: NA Do you need transportation to your follow-up appointment?: No Do you understand care options if your condition(s) worsen?: Yes-patient verbalized understanding  Patient in [place] per spouse  SIGNATURE Karena Addison, LPN [facility] Nurse Health Advisor Direct Dial [phone]

## 2023-02-10 DIAGNOSIS — F331 Major depressive disorder, recurrent, moderate: Secondary | ICD-10-CM | POA: Diagnosis not present

## 2023-02-16 ENCOUNTER — Ambulatory Visit (INDEPENDENT_AMBULATORY_CARE_PROVIDER_SITE_OTHER): Payer: BC Managed Care – PPO | Admitting: Infectious Disease

## 2023-02-16 ENCOUNTER — Encounter: Payer: Self-pay | Admitting: Infectious Disease

## 2023-02-16 ENCOUNTER — Other Ambulatory Visit: Payer: Self-pay

## 2023-02-16 VITALS — BP 157/100 | HR 92 | Temp 97.2°F | Ht 70.0 in | Wt 120.0 lb

## 2023-02-16 DIAGNOSIS — N200 Calculus of kidney: Secondary | ICD-10-CM | POA: Diagnosis not present

## 2023-02-16 DIAGNOSIS — B952 Enterococcus as the cause of diseases classified elsewhere: Secondary | ICD-10-CM

## 2023-02-16 DIAGNOSIS — R7881 Bacteremia: Secondary | ICD-10-CM

## 2023-02-16 DIAGNOSIS — Z960 Presence of urogenital implants: Secondary | ICD-10-CM

## 2023-02-16 HISTORY — DX: Calculus of kidney: N20.0

## 2023-02-16 NOTE — Progress Notes (Signed)
Subjective:  Complaint follow-up for enterococcal bacteremia  Patient ID: Richard Andrews, male    DOB: 1961/03/17, 62 y.o.   MRN: [id number]  HPI  62 y.o. 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  Improved quickly on IV antibiotics and cleared his bacteremia.  We ultimately changed him over to amoxicillin 1 g 3 times daily with plans for him to get a month of therapy at [facility], we last saw him on 15 August BX he was not discharged until 28 August.  He is residing in skilled nursing facility and getting the antibiotics 3 times daily it sounds as if he is going to see urology possibly tomorrow but want to confirm that with his facility.  He is doing well without complaints  Past Medical History:  Diagnosis Date   Allergy    Distal radius fracture, left    Hypertension     Past Surgical History:  Procedure Laterality Date   BIOPSY  01/22/2021   Procedure: BIOPSY;  Surgeon: Shellia Cleverly, DO;  Location: [facility];  Service: Gastroenterology;;   CYSTOSCOPY W/ URETERAL STENT PLACEMENT Bilateral 01/23/2023   Procedure: 1. Cystoscopy 2. Bilateral  retrograde pyelogram with interpretation 3. bilateral ureteral stent placement (6x24 cm on the right, 6x26 cm on the left) 4. Fluoroscopy <1 hour with intraoperative interpretation;  Surgeon: Despina Arias, MD;  Location: [facility] OR;  Service: Urology;  Laterality: Bilateral;   ESOPHAGOGASTRODUODENOSCOPY (EGD) WITH PROPOFOL N/A 01/22/2021   Procedure: ESOPHAGOGASTRODUODENOSCOPY (EGD) WITH PROPOFOL;  Surgeon: Shellia Cleverly, DO;  Location: [facility];  Service: Gastroenterology;  Laterality: N/A;   I & D EXTREMITY Right 01/10/2020   Procedure: ORIF RIGHT WRIST;  Surgeon: Dominica Severin, MD;  Location: [facility];  Service: Orthopedics;  Laterality: Right;   IR ANGIO EXTERNAL CAROTID SEL EXT CAROTID UNI R MOD SED  10/04/2022   IR ANGIO INTRA EXTRACRAN SEL INTERNAL CAROTID UNI R MOD SED  09/30/2022   IR  ANGIOGRAM FOLLOW UP STUDY  09/30/2022   IR NEURO EACH ADD'L AFTER BASIC UNI RIGHT (MS)  10/04/2022   IR TRANSCATH/EMBOLIZ  09/30/2022   OPEN REDUCTION INTERNAL FIXATION (ORIF) DISTAL RADIAL FRACTURE Left 01/28/2015   Procedure: OPEN REDUCTION INTERNAL FIXATION (ORIF) LEFT DISTAL RADIAL FRACTURE;  Surgeon: Tarry Kos, MD;  Location: [facility];  Service: Orthopedics;  Laterality: Left;   ORIF HUMERUS FRACTURE Right 09/27/2022   Procedure: OPEN REDUCTION INTERNAL FIXATION (ORIF) PROXIMAL HUMERUS FRACTURE;  Surgeon: Teryl Lucy, MD;  Location: [facility];  Service: Orthopedics;  Laterality: Right;   RADIOLOGY WITH ANESTHESIA N/A 09/30/2022   Procedure: IR WITH ANESTHESIA MMA EMBOLIZATION;  Surgeon: Radiologist, Medication, MD;  Location: [facility];  Service: Radiology;  Laterality: N/A;   TONSILLECTOMY      Family History  Problem Relation Age of Onset   Heart disease Mother    Hyperlipidemia Mother    Skin cancer Mother    Cancer Father    Multiple myeloma Sister    Throat cancer Sister       Social History   Socioeconomic History   Marital status: Married    Spouse name: Trula Ore   Number of children: 5   Years of education: 12   Highest education level: Not on file  Occupational History   Occupation: Emergency planning/management officer  Tobacco Use   Smoking status: Every Day    Current packs/day: 1.00    Average packs/day: 1 pack/day for 10.0 years (10.0 ttl  pk-yrs)    Types: Cigarettes   Smokeless tobacco: Current    Types: Chew  Vaping Use   Vaping status: Never Used  Substance and Sexual Activity   Alcohol use: Yes    Alcohol/week: 16.0 - 20.0 standard drinks of alcohol    Types: 16 - 20 Standard drinks or equivalent per week    Comment: 1 gallon/week   Drug use: Never   Sexual activity: Yes    Partners: Female  Other Topics Concern   Not on file  Social History Narrative   Not on file   Social Determinants of Health   Financial Resource Strain: Not on file  Food  Insecurity: Patient Declined (01/31/2023)   Hunger Vital Sign    Worried About Running Out of Food in the Last Year: Patient declined    Ran Out of Food in the Last Year: Patient declined  Transportation Needs: Patient Declined (01/31/2023)   PRAPARE - Administrator, Civil Service (Medical): Patient declined    Lack of Transportation (Non-Medical): Patient declined  Physical Activity: Not on file  Stress: Not on file  Social Connections: Not on file    No Known Allergies   Current Outpatient Medications:    acetaminophen (TYLENOL) 325 MG tablet, Take 2 tablets (650 mg total) by mouth every 6 (six) hours as needed for mild pain (or Fever >/= 101)., Disp: , Rfl:    amoxicillin (AMOXIL) 500 MG capsule, Take 2 capsules (1,000 mg total) by mouth 3 (three) times daily for 28 days., Disp: 168 capsule, Rfl: 0   DULoxetine (CYMBALTA) 30 MG capsule, Take 1 capsule (30mg ) daily for one week then increase to 1 tablet (30mg ) twice daily., Disp: 60 capsule, Rfl: 1   pantoprazole (PROTONIX) 40 MG tablet, Take 1 tablet (40 mg total) by mouth daily., Disp: 30 tablet, Rfl: 0   thiamine (VITAMIN B-1) 100 MG tablet, Take 1 tablet (100 mg total) by mouth daily., Disp: , Rfl:      Review of Systems  Constitutional:  Negative for activity change, appetite change, chills, diaphoresis, fatigue, fever and unexpected weight change.  HENT:  Negative for congestion, rhinorrhea, sinus pressure, sneezing, sore throat and trouble swallowing.   Eyes:  Negative for photophobia and visual disturbance.  Respiratory:  Negative for cough, chest tightness, shortness of breath, wheezing and stridor.   Cardiovascular:  Negative for chest pain, palpitations and leg swelling.  Gastrointestinal:  Negative for abdominal distention, abdominal pain, anal bleeding, blood in stool, constipation, diarrhea, nausea and vomiting.  Genitourinary:  Negative for difficulty urinating, dysuria, flank pain and hematuria.   Musculoskeletal:  Negative for arthralgias, back pain, gait problem, joint swelling and myalgias.  Skin:  Negative for color change, pallor, rash and wound.  Neurological:  Negative for dizziness, tremors, weakness and light-headedness.  Hematological:  Negative for adenopathy. Does not bruise/bleed easily.  Psychiatric/Behavioral:  Negative for agitation, behavioral problems, confusion, decreased concentration, dysphoric mood and sleep disturbance.        Objective:   Physical Exam Constitutional:      Appearance: He is well-developed.  HENT:     Head: Normocephalic and atraumatic.  Eyes:     Conjunctiva/sclera: Conjunctivae normal.  Cardiovascular:     Rate and Rhythm: Normal rate and regular rhythm.  Pulmonary:     Effort: Pulmonary effort is normal. No respiratory distress.     Breath sounds: No wheezing.  Abdominal:     General: There is no distension.     Palpations:  Abdomen is soft.  Musculoskeletal:        General: No tenderness. Normal range of motion.     Cervical back: Normal range of motion and neck supple.  Skin:    General: Skin is warm and dry.     Coloration: Skin is not pale.     Findings: No erythema or rash.  Neurological:     General: No focal deficit present.     Mental Status: He is alert and oriented to person, place, and time.  Psychiatric:        Mood and Affect: Mood normal.        Behavior: Behavior normal.        Thought Content: Thought content normal.        Judgment: Judgment normal.           Assessment & Plan:  Enterococcal bacteremia secondary to urinary source with bilateral UPJ stones status post stents x 2:  Continue high-dose amoxicillin for now that he is on in the nursing facility.  Hope you see urology.  I would ideally prefer that the stents be removed because of Concerns that they cannot be a nidus of infection.  At minimum I think would be a good idea to have him exchange but certainly this is urology his decision.  If  they remove the stents would plan on him getting amoxicillin through date of stent removal or exchange.  If the stents are going to be left in place we will just have to take a "leap of faith" and eventually see how he does off antibiotics.  I going to schedule him for follow-up with Rexene Alberts prior to his antibiotic course been complaining of the facility  I have personally spent 26 minutes involved in face-to-face and non-face-to-face activities for this patient on the day of the visit. Professional time spent includes the following activities: Preparing to see the patient (review of tests), Obtaining and/or reviewing separately obtained history (admission/discharge record), Performing a medically appropriate examination and/or evaluation , Ordering medications/tests/procedures, referring and communicating with other health care professionals, Documenting clinical information in the EMR, Independently interpreting results (not separately reported), Communicating results to the patient/family/caregiver, Counseling and educating the patient/family/caregiver and Care coordination (not separately reported).   Marland Kitchen

## 2023-02-17 LAB — COMPLETE METABOLIC PANEL WITH GFR
AG Ratio: 1.1 (calc) (ref 1.0–2.5)
ALT: 13 U/L (ref 9–46)
AST: 24 U/L (ref 10–35)
Albumin: 3.8 g/dL (ref 3.6–5.1)
Alkaline phosphatase (APISO): 114 U/L (ref 35–144)
BUN: 12 mg/dL (ref 7–25)
CO2: 27 mmol/L (ref 20–32)
Calcium: 9.4 mg/dL (ref 8.6–10.3)
Chloride: 101 mmol/L (ref 98–110)
Creat: 0.78 mg/dL (ref 0.70–1.35)
Globulin: 3.4 g/dL (ref 1.9–3.7)
Glucose, Bld: 87 mg/dL (ref 65–99)
Potassium: 3.5 mmol/L (ref 3.5–5.3)
Sodium: 139 mmol/L (ref 135–146)
Total Bilirubin: 0.4 mg/dL (ref 0.2–1.2)
Total Protein: 7.2 g/dL (ref 6.1–8.1)
eGFR: 100 mL/min/{1.73_m2} (ref >=60)

## 2023-02-17 LAB — CBC WITH DIFFERENTIAL/PLATELET
Absolute Monocytes: 483 {cells}/uL (ref 200–950)
Basophils Absolute: 21 {cells}/uL (ref 0–200)
Basophils Relative: 0.7 %
Eosinophils Absolute: 372 {cells}/uL (ref 15–500)
Eosinophils Relative: 12.4 %
HCT: 34.1 % — ABNORMAL LOW (ref 38.5–50.0)
Hemoglobin: 11.4 g/dL — ABNORMAL LOW (ref 13.2–17.1)
Lymphs Abs: 1173 {cells}/uL (ref 850–3900)
MCH: 33.5 pg — ABNORMAL HIGH (ref 27.0–33.0)
MCHC: 33.4 g/dL (ref 32.0–36.0)
MCV: 100.3 fL — ABNORMAL HIGH (ref 80.0–100.0)
MPV: 10.2 fL (ref 7.5–12.5)
Monocytes Relative: 16.1 %
Neutro Abs: 951 {cells}/uL — ABNORMAL LOW (ref 1500–7800)
Neutrophils Relative %: 31.7 %
Platelets: 201 10*3/uL (ref 140–400)
RBC: 3.4 10*6/uL — ABNORMAL LOW (ref 4.20–5.80)
RDW: 13 % (ref 11.0–15.0)
Total Lymphocyte: 39.1 %
WBC: 3 10*3/uL — ABNORMAL LOW (ref 3.8–10.8)

## 2023-02-20 ENCOUNTER — Other Ambulatory Visit: Payer: Self-pay | Admitting: Urology

## 2023-02-22 ENCOUNTER — Telehealth: Payer: Self-pay | Admitting: Medical-Surgical

## 2023-02-22 NOTE — Telephone Encounter (Signed)
Zona from [facility] called requesting an update on medical clearance paperwork that was faxed over on 9/9 and 9/10. She stated the patient is still at the rehab center.   Zona - [phone] extension [phone]

## 2023-02-22 NOTE — Telephone Encounter (Signed)
Surgery is scheduled for 03/21/23.

## 2023-03-02 NOTE — Telephone Encounter (Signed)
Spoke with ex-wife. Surgical clearance added to 9/24 appointment.

## 2023-03-02 NOTE — Telephone Encounter (Signed)
Charlene with [facility] called about patient not yet scheduled for surgery. Lvm for patient to call to schedule Surgical Clearance.

## 2023-03-03 ENCOUNTER — Ambulatory Visit: Payer: BC Managed Care – PPO | Admitting: Infectious Diseases

## 2023-03-06 NOTE — Progress Notes (Signed)
Patient interviewed over the phone as he forgot he had an appointment today. Had labs done at PCP this morning. Called admitting to transfer call. They were busy at the time. They will call patient back. Patient aware he will be getting another call. Emailed instructions to preferred email ([email or web address]).   COVID Vaccine Completed: yes  Date of COVID positive in last 90 days: no  PCP - [name], NP Cardiologist - n/a  CT- 11/15/22 Epic Chest x-ray - n/a EKG -11/16/22 Epic  Stress Test - n/a ECHO - 01/25/23 Epic Cardiac Cath - n/a Pacemaker/ICD device last checked: n/a Spinal Cord Stimulator: n/a  Bowel Prep - no  Sleep Study - n/a CPAP -   Fasting Blood Sugar - n/a Checks Blood Sugar _____ times a day  Last dose of GLP1 agonist-  N/A GLP1 instructions:  N/A   Last dose of SGLT-2 inhibitors-  N/A SGLT-2 instructions: N/A   Blood Thinner Instructions:  n/a Aspirin Instructions: Last Dose:  Activity level: Can perform activities of daily living without stopping and without symptoms of chest pain or shortness of breath. Slow with stairs due to right leg weakness  Anesthesia review: HTN, hepatic steatosis, thrombocytopenia, SDH, seizures   Patient denies shortness of breath, fever, cough and chest pain at PAT appointment  Patient verbalized understanding of instructions that were given to them at the PAT appointment. Patient was also instructed that they will need to review over the PAT instructions again at home before surgery.

## 2023-03-07 ENCOUNTER — Other Ambulatory Visit: Payer: Self-pay

## 2023-03-07 ENCOUNTER — Encounter: Payer: Self-pay | Admitting: Medical-Surgical

## 2023-03-07 ENCOUNTER — Encounter (HOSPITAL_COMMUNITY): Payer: Self-pay

## 2023-03-07 ENCOUNTER — Encounter (HOSPITAL_COMMUNITY)
Admission: RE | Admit: 2023-03-07 | Discharge: 2023-03-07 | Disposition: A | Discharge status: Home / Self Care | Payer: BC Managed Care – PPO | Source: Ambulatory Visit | Attending: Urology | Admitting: Urology

## 2023-03-07 ENCOUNTER — Ambulatory Visit (INDEPENDENT_AMBULATORY_CARE_PROVIDER_SITE_OTHER): Payer: BC Managed Care – PPO | Admitting: Medical-Surgical

## 2023-03-07 VITALS — BP 164/97 | HR 111 | Resp 20 | Ht 70.0 in | Wt 130.3 lb

## 2023-03-07 DIAGNOSIS — D619 Aplastic anemia, unspecified: Secondary | ICD-10-CM

## 2023-03-07 DIAGNOSIS — D649 Anemia, unspecified: Secondary | ICD-10-CM

## 2023-03-07 DIAGNOSIS — K760 Fatty (change of) liver, not elsewhere classified: Secondary | ICD-10-CM

## 2023-03-07 DIAGNOSIS — I10 Essential (primary) hypertension: Secondary | ICD-10-CM

## 2023-03-07 DIAGNOSIS — Z09 Encounter for follow-up examination after completed treatment for conditions other than malignant neoplasm: Secondary | ICD-10-CM | POA: Diagnosis not present

## 2023-03-07 DIAGNOSIS — Z01818 Encounter for other preprocedural examination: Secondary | ICD-10-CM

## 2023-03-07 HISTORY — DX: Other psychoactive substance abuse, uncomplicated: F19.10

## 2023-03-07 HISTORY — DX: Fatty (change of) liver, not elsewhere classified: K76.0

## 2023-03-07 HISTORY — DX: Unspecified osteoarthritis, unspecified site: M19.90

## 2023-03-07 NOTE — Progress Notes (Unsigned)
        Established patient visit  History, exam, impression, and plan:  No problem-specific Assessment & Plan notes found for this encounter. Admitted 01/22/2023  Significant studies: 8/11>> CT head: New SDH 8/11>> CT C-spine: No fracture 8/12>> CT abdomen/pelvis: Acute pancreatitis, bilateral hydronephrosis with bilateral UPJ stones. 8/12>> CT head: Slightly increased size of SDH. 8/12>> renal ultrasound: Mild to moderate right hydronephrosis, left renal pelvic stone is again observed without substantial left hydronephrosis. 8/14>> TTE: EF 50-55%-no obvious vegetation   Significant microbiology data: 8/12>> blood culture: Enterococcus 8/13>> C. difficile study: Positive 8/13>> GI pathogen panel: EPEC detected 8/14>> blood culture: No growth   Procedures: 8/12>> bilateral ureteral stent placement by urology  Discharged 02/05/2023 Went to rehab facility Came home  Upcoming surgical procedure for urological concerns Continue amoxicillin until procedure completed Missed follow-up with infectious disease  Procedures performed this visit: None.  No follow-ups on file.  __________________________________ Thayer Ohm, DNP, APRN, FNP-BC Primary Care and Sports Medicine [facility]

## 2023-03-07 NOTE — Patient Instructions (Addendum)
SURGICAL WAITING ROOM VISITATION  Patients having surgery or a procedure may have no more than 2 support people in the waiting area - these visitors may rotate.    Children under the age of 27 must have an adult with them who is not the patient.  Due to an increase in RSV and influenza rates and associated hospitalizations, children ages 70 and under may not visit patients in [facility] hospitals.  If the patient needs to stay at the hospital during part of their recovery, the visitor guidelines for inpatient rooms apply. Pre-op nurse will coordinate an appropriate time for 1 support person to accompany patient in pre-op.  This support person may not rotate.    Please refer to the [facility] website for the visitor guidelines for Inpatients (after your surgery is over and you are in a regular room).    Your procedure is scheduled on: 03/21/23   Report to [facility] Main Entrance    Report to admitting at 6:45 AM   Call this number if you have problems the morning of surgery [phone]   Do not eat food or drink liquids:After Midnight.          If you have questions, please contact your surgeon's office.   FOLLOW BOWEL PREP AND ANY ADDITIONAL PRE OP INSTRUCTIONS YOU RECEIVED FROM YOUR SURGEON'S OFFICE!!!     Oral Hygiene is also important to reduce your risk of infection.                                    Remember - BRUSH YOUR TEETH THE MORNING OF SURGERY WITH YOUR REGULAR TOOTHPASTE  DENTURES WILL BE REMOVED PRIOR TO SURGERY PLEASE DO NOT APPLY "Poly grip" OR ADHESIVES!!!   Do NOT smoke after Midnight   Stop all vitamins and herbal supplements 7 days before surgery.   Take these medicines the morning of surgery with A SIP OF WATER: Tylenol, Duloxetine, Pantoprazole, Amoxicillin                               You may not have any metal on your body including jewelry, and body piercing             Do not wear lotions, powders, cologne, or deodorant               Men may shave face and neck.   Do not bring valuables to the hospital. [facility] IS NOT             RESPONSIBLE   FOR VALUABLES.   Contacts, glasses, dentures or bridgework may not be worn into surgery.  DO NOT BRING YOUR HOME MEDICATIONS TO THE HOSPITAL. PHARMACY WILL DISPENSE MEDICATIONS LISTED ON YOUR MEDICATION LIST TO YOU DURING YOUR ADMISSION IN THE HOSPITAL!    Patients discharged on the day of surgery will not be allowed to drive home.  Someone NEEDS to stay with you for the first 24 hours after anesthesia.              Please read over the following fact sheets you were given: IF YOU HAVE QUESTIONS ABOUT YOUR PRE-OP INSTRUCTIONS PLEASE CALL [phone]   If you received a COVID test during your pre-op visit  it is requested that you wear a mask when out in public, stay away from anyone that may not  be feeling well and notify your surgeon if you develop symptoms. If you test positive for Covid or have been in contact with anyone that has tested positive in the last 10 days please notify you surgeon.    [facility] - Preparing for Surgery Before surgery, you can play an important role.  Because skin is not sterile, your skin needs to be as free of germs as possible.  You can reduce the number of germs on your skin by washing with CHG (chlorahexidine gluconate) soap before surgery.  CHG is an antiseptic cleaner which kills germs and bonds with the skin to continue killing germs even after washing. Please DO NOT use if you have an allergy to CHG or antibacterial soaps.  If your skin becomes reddened/irritated stop using the CHG and inform your nurse when you arrive at Short Stay. Do not shave (including legs and underarms) for at least 48 hours prior to the first CHG shower.  You may shave your face/neck.  Please follow these instructions carefully:  1.  Shower with CHG Soap the night before surgery and the  morning of surgery.  2.  If you choose to wash your hair, wash your  hair first as usual with your normal  shampoo.  3.  After you shampoo, rinse your hair and body thoroughly to remove the shampoo.                             4.  Use CHG as you would any other liquid soap.  You can apply chg directly to the skin and wash.  Gently with a scrungie or clean washcloth.  5.  Apply the CHG Soap to your body ONLY FROM THE NECK DOWN.   Do   not use on face/ open                           Wound or open sores. Avoid contact with eyes, ears mouth and   genitals (private parts).                       Wash face,  Genitals (private parts) with your normal soap.             6.  Wash thoroughly, paying special attention to the area where your    surgery  will be performed.  7.  Thoroughly rinse your body with warm water from the neck down.  8.  DO NOT shower/wash with your normal soap after using and rinsing off the CHG Soap.                9.  Pat yourself dry with a clean towel.            10.  Wear clean pajamas.            11.  Place clean sheets on your bed the night of your first shower and do not  sleep with pets. Day of Surgery : Do not apply any lotions/deodorants the morning of surgery.  Please wear clean clothes to the hospital/surgery center.  FAILURE TO FOLLOW THESE INSTRUCTIONS MAY RESULT IN THE CANCELLATION OF YOUR SURGERY  PATIENT SIGNATURE_________________________________  NURSE SIGNATURE__________________________________  ________________________________________________________________________

## 2023-03-08 LAB — CMP14+EGFR
ALT: 9 IU/L (ref 0–44)
AST: 27 IU/L (ref 0–40)
Albumin: 4.1 g/dL (ref 3.9–4.9)
Alkaline Phosphatase: 116 IU/L (ref 44–121)
BUN/Creatinine Ratio: 6 — ABNORMAL LOW (ref 10–24)
BUN: 5 mg/dL — ABNORMAL LOW (ref 8–27)
Bilirubin Total: 0.7 mg/dL (ref 0.0–1.2)
CO2: 21 mmol/L (ref 20–29)
Calcium: 9.2 mg/dL (ref 8.6–10.2)
Chloride: 102 mmol/L (ref 96–106)
Creatinine, Ser: 0.79 mg/dL (ref 0.76–1.27)
Globulin, Total: 3.3 g/dL (ref 1.5–4.5)
Glucose: 93 mg/dL (ref 70–99)
Potassium: 3.6 mmol/L (ref 3.5–5.2)
Sodium: 144 mmol/L (ref 134–144)
Total Protein: 7.4 g/dL (ref 6.0–8.5)
eGFR: 100 mL/min/{1.73_m2} (ref >59)

## 2023-03-08 LAB — CBC
Hematocrit: 34.4 % — ABNORMAL LOW (ref 37.5–51.0)
Hemoglobin: 11.7 g/dL — ABNORMAL LOW (ref 13.0–17.7)
MCH: 34.8 pg — ABNORMAL HIGH (ref 26.6–33.0)
MCHC: 34 g/dL (ref 31.5–35.7)
MCV: 102 fL — ABNORMAL HIGH (ref 79–97)
Platelets: 120 10*3/uL — ABNORMAL LOW (ref 150–450)
RBC: 3.36 x10E6/uL — ABNORMAL LOW (ref 4.14–5.80)
RDW: 14 % (ref 11.6–15.4)
WBC: 3.9 10*3/uL (ref 3.4–10.8)

## 2023-03-08 MED ORDER — LISINOPRIL-HYDROCHLOROTHIAZIDE 10-12.5 MG PO TABS: 1.0000 | ORAL_TABLET | Freq: Every day | ORAL | 3 refills | Status: DC
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
## 2023-03-13 NOTE — Anesthesia Preprocedure Evaluation (Addendum)
Anesthesia Evaluation  Patient identified by MRN, date of birth, ID band Patient awake    Reviewed: Allergy & Precautions, NPO status , Patient's Chart, lab work & pertinent test results  History of Anesthesia Complications Negative for: history of anesthetic complications  Airway Mallampati: II  TM Distance: >3 FB Neck ROM: Full    Dental  (+) Dental Advisory Given, Missing, Poor Dentition, Chipped   Pulmonary Current Smoker and Patient abstained from smoking.   Pulmonary exam normal        Cardiovascular hypertension (off meds for several yrs, noncompliance), Normal cardiovascular exam   '24 TTE - EF 50 to 55%. Trivial MR.    Neuro/Psych Seizures -, Well Controlled,   Hx ICH   negative psych ROS   GI/Hepatic PUD,,,(+)     substance abuse  alcohol use  Endo/Other  negative endocrine ROS    Renal/GU negative Renal ROS     Musculoskeletal  (+) Arthritis ,    Abdominal   Peds  Hematology  (+) Blood dyscrasia, anemia  Plt 120k    Anesthesia Other Findings   Reproductive/Obstetrics                             Anesthesia Physical Anesthesia Plan  ASA: 3  Anesthesia Plan: General   Post-op Pain Management: Minimal or no pain anticipated   Induction: Intravenous  PONV Risk Score and Plan: 1 and Treatment may vary due to age or medical condition, Ondansetron, Dexamethasone and Midazolam  Airway Management Planned: LMA  Additional Equipment: None  Intra-op Plan:   Post-operative Plan: Extubation in OR  Informed Consent: I have reviewed the patients History and Physical, chart, labs and discussed the procedure including the risks, benefits and alternatives for the proposed anesthesia with the patient or authorized representative who has indicated his/her understanding and acceptance.     Dental advisory given  Plan Discussed with: CRNA and Anesthesiologist  Anesthesia  Plan Comments: (See PAT note )        Anesthesia Quick Evaluation

## 2023-03-17 ENCOUNTER — Other Ambulatory Visit: Payer: Self-pay | Admitting: Medical-Surgical

## 2023-03-17 DIAGNOSIS — Z1211 Encounter for screening for malignant neoplasm of colon: Secondary | ICD-10-CM

## 2023-03-17 DIAGNOSIS — Z1212 Encounter for screening for malignant neoplasm of rectum: Secondary | ICD-10-CM

## 2023-03-21 ENCOUNTER — Other Ambulatory Visit: Payer: Self-pay

## 2023-03-21 ENCOUNTER — Ambulatory Visit (HOSPITAL_COMMUNITY): Payer: BC Managed Care – PPO | Admitting: Anesthesiology

## 2023-03-21 ENCOUNTER — Encounter (HOSPITAL_COMMUNITY)
Admission: RE | Disposition: A | Discharge status: Home / Self Care | Payer: Self-pay | Source: Ambulatory Visit | Attending: Urology

## 2023-03-21 ENCOUNTER — Ambulatory Visit (HOSPITAL_COMMUNITY): Payer: BC Managed Care – PPO

## 2023-03-21 ENCOUNTER — Ambulatory Visit (HOSPITAL_COMMUNITY): Payer: BC Managed Care – PPO | Admitting: Medical

## 2023-03-21 ENCOUNTER — Ambulatory Visit (HOSPITAL_COMMUNITY)
Admission: RE | Admit: 2023-03-21 | Discharge: 2023-03-21 | Disposition: A | Discharge status: Home / Self Care | Payer: BC Managed Care – PPO | Source: Ambulatory Visit | Attending: Urology | Admitting: Urology

## 2023-03-21 ENCOUNTER — Encounter (HOSPITAL_COMMUNITY): Payer: Self-pay | Admitting: Urology

## 2023-03-21 DIAGNOSIS — I10 Essential (primary) hypertension: Secondary | ICD-10-CM

## 2023-03-21 DIAGNOSIS — K279 Peptic ulcer, site unspecified, unspecified as acute or chronic, without hemorrhage or perforation: Secondary | ICD-10-CM | POA: Insufficient documentation

## 2023-03-21 DIAGNOSIS — F17210 Nicotine dependence, cigarettes, uncomplicated: Secondary | ICD-10-CM | POA: Insufficient documentation

## 2023-03-21 DIAGNOSIS — K760 Fatty (change of) liver, not elsewhere classified: Secondary | ICD-10-CM

## 2023-03-21 DIAGNOSIS — R569 Unspecified convulsions: Secondary | ICD-10-CM | POA: Diagnosis not present

## 2023-03-21 DIAGNOSIS — D649 Anemia, unspecified: Secondary | ICD-10-CM | POA: Diagnosis not present

## 2023-03-21 DIAGNOSIS — M1990 Unspecified osteoarthritis, unspecified site: Secondary | ICD-10-CM | POA: Diagnosis not present

## 2023-03-21 DIAGNOSIS — Z87440 Personal history of urinary (tract) infections: Secondary | ICD-10-CM | POA: Insufficient documentation

## 2023-03-21 DIAGNOSIS — N201 Calculus of ureter: Secondary | ICD-10-CM | POA: Diagnosis present

## 2023-03-21 HISTORY — PX: CYSTOSCOPY/URETEROSCOPY/HOLMIUM LASER/STENT PLACEMENT: SHX6546

## 2023-03-21 LAB — COMPREHENSIVE METABOLIC PANEL
ALT: 23 U/L (ref 0–44)
AST: 69 U/L — ABNORMAL HIGH (ref 15–41)
Albumin: 4 g/dL (ref 3.5–5.0)
Alkaline Phosphatase: 82 U/L (ref 38–126)
Anion gap: 14 (ref 5–15)
BUN: 7 mg/dL — ABNORMAL LOW (ref 8–23)
CO2: 20 mmol/L — ABNORMAL LOW (ref 22–32)
Calcium: 8.9 mg/dL (ref 8.9–10.3)
Chloride: 103 mmol/L (ref 98–111)
Creatinine, Ser: 0.85 mg/dL (ref 0.61–1.24)
GFR, Estimated: >60 mL/min (ref >60)
Glucose, Bld: 73 mg/dL (ref 70–99)
Potassium: 3.5 mmol/L (ref 3.5–5.1)
Sodium: 137 mmol/L (ref 135–145)
Total Bilirubin: 1.6 mg/dL — ABNORMAL HIGH (ref 0.3–1.2)
Total Protein: 8 g/dL (ref 6.5–8.1)

## 2023-03-21 LAB — CBC
HCT: 37.4 % — ABNORMAL LOW (ref 39.0–52.0)
Hemoglobin: 12.5 g/dL — ABNORMAL LOW (ref 13.0–17.0)
MCH: 35.3 pg — ABNORMAL HIGH (ref 26.0–34.0)
MCHC: 33.4 g/dL (ref 30.0–36.0)
MCV: 105.6 fL — ABNORMAL HIGH (ref 80.0–100.0)
Platelets: 96 10*3/uL — ABNORMAL LOW (ref 150–400)
RBC: 3.54 MIL/uL — ABNORMAL LOW (ref 4.22–5.81)
RDW: 15.4 % (ref 11.5–15.5)
WBC: 4.1 10*3/uL (ref 4.0–10.5)
nRBC: 0 % (ref 0.0–0.2)

## 2023-03-21 SURGERY — CYSTOSCOPY/URETEROSCOPY/HOLMIUM LASER/STENT PLACEMENT
Anesthesia: General | Laterality: Bilateral

## 2023-03-21 MED ORDER — LIDOCAINE HCL (PF) 2 % IJ SOLN
INTRAMUSCULAR | Status: AC
  Filled 2023-03-21: qty 5

## 2023-03-21 MED ORDER — MIDAZOLAM HCL 2 MG/2ML IJ SOLN
INTRAMUSCULAR | Status: AC
  Filled 2023-03-21: qty 2

## 2023-03-21 MED ORDER — CELECOXIB 200 MG PO CAPS
200.0000 mg | ORAL_CAPSULE | Freq: Two times a day (BID) | ORAL | 1 refills | Status: AC
Start: 2023-03-21 — End: 2023-04-18

## 2023-03-21 MED ORDER — ONDANSETRON HCL 4 MG/2ML IJ SOLN
INTRAMUSCULAR | Status: DC | PRN
  Administered 2023-03-21: 4 mg via INTRAVENOUS

## 2023-03-21 MED ORDER — PROPOFOL 10 MG/ML IV BOLUS
INTRAVENOUS | Status: AC
  Filled 2023-03-21: qty 20

## 2023-03-21 MED ORDER — FENTANYL CITRATE (PF) 100 MCG/2ML IJ SOLN
INTRAMUSCULAR | Status: DC | PRN
  Administered 2023-03-21 (×4): 25 ug via INTRAVENOUS

## 2023-03-21 MED ORDER — DEXAMETHASONE SODIUM PHOSPHATE 10 MG/ML IJ SOLN
INTRAMUSCULAR | Status: DC | PRN
  Administered 2023-03-21: 6 mg via INTRAVENOUS

## 2023-03-21 MED ORDER — NITROFURANTOIN MONOHYD MACRO 100 MG PO CAPS: 100.0000 mg | ORAL_CAPSULE | Freq: Two times a day (BID) | ORAL | 0 refills | Status: AC

## 2023-03-21 MED ORDER — TRAMADOL HCL 50 MG PO TABS
50.0000 mg | ORAL_TABLET | Freq: Four times a day (QID) | ORAL | 0 refills | Status: DC | PRN
Start: 2023-03-21 — End: 2023-05-09

## 2023-03-21 MED ORDER — CHLORHEXIDINE GLUCONATE 0.12 % MT SOLN
15.0000 mL | Freq: Once | OROMUCOSAL | Status: AC
  Administered 2023-03-21: 15 mL via OROMUCOSAL

## 2023-03-21 MED ORDER — IOHEXOL 300 MG/ML  SOLN
INTRAMUSCULAR | Status: DC | PRN
  Administered 2023-03-21: 14 mL

## 2023-03-21 MED ORDER — OXYCODONE HCL 5 MG PO TABS: 5.0000 mg | ORAL_TABLET | Freq: Once | ORAL | Status: DC | PRN

## 2023-03-21 MED ORDER — OXYCODONE HCL 5 MG/5ML PO SOLN: 5.0000 mg | Freq: Once | ORAL | Status: DC | PRN

## 2023-03-21 MED ORDER — LIDOCAINE 2% (20 MG/ML) 5 ML SYRINGE
INTRAMUSCULAR | Status: DC | PRN
  Administered 2023-03-21: 40 mg via INTRAVENOUS
  Administered 2023-03-21: 60 mg via INTRAVENOUS

## 2023-03-21 MED ORDER — SODIUM CHLORIDE 0.9 % IR SOLN
Status: DC | PRN
  Administered 2023-03-21: 6000 mL via INTRAVESICAL

## 2023-03-21 MED ORDER — KETOROLAC TROMETHAMINE 30 MG/ML IJ SOLN
INTRAMUSCULAR | Status: DC | PRN
Start: 2023-03-21 — End: 2023-03-21
  Administered 2023-03-21: 30 mg via INTRAVENOUS

## 2023-03-21 MED ORDER — PROPOFOL 10 MG/ML IV BOLUS
INTRAVENOUS | Status: DC | PRN
  Administered 2023-03-21: 30 mg via INTRAVENOUS
  Administered 2023-03-21: 120 mg via INTRAVENOUS

## 2023-03-21 MED ORDER — KETOROLAC TROMETHAMINE 30 MG/ML IJ SOLN
INTRAMUSCULAR | Status: AC
  Filled 2023-03-21: qty 1

## 2023-03-21 MED ORDER — FENTANYL CITRATE PF 50 MCG/ML IJ SOSY: 25.0000 ug | PREFILLED_SYRINGE | INTRAMUSCULAR | Status: DC | PRN

## 2023-03-21 MED ORDER — LACTATED RINGERS IV SOLN: INTRAVENOUS | Status: DC

## 2023-03-21 MED ORDER — DEXAMETHASONE SODIUM PHOSPHATE 10 MG/ML IJ SOLN
INTRAMUSCULAR | Status: AC
  Filled 2023-03-21: qty 1

## 2023-03-21 MED ORDER — ORAL CARE MOUTH RINSE: 15.0000 mL | Freq: Once | OROMUCOSAL | Status: AC

## 2023-03-21 MED ORDER — ONDANSETRON HCL 4 MG/2ML IJ SOLN: 4.0000 mg | Freq: Once | INTRAMUSCULAR | Status: DC | PRN

## 2023-03-21 MED ORDER — ONDANSETRON HCL 4 MG/2ML IJ SOLN
INTRAMUSCULAR | Status: AC
  Filled 2023-03-21: qty 2

## 2023-03-21 MED ORDER — HYOSCYAMINE SULFATE 0.125 MG SL SUBL: 0.1250 mg | SUBLINGUAL_TABLET | SUBLINGUAL | 0 refills | Status: DC | PRN

## 2023-03-21 MED ORDER — FENTANYL CITRATE (PF) 100 MCG/2ML IJ SOLN
INTRAMUSCULAR | Status: AC
  Filled 2023-03-21: qty 2

## 2023-03-21 MED ORDER — MIDAZOLAM HCL 5 MG/5ML IJ SOLN
INTRAMUSCULAR | Status: DC | PRN
  Administered 2023-03-21: 2 mg via INTRAVENOUS

## 2023-03-21 MED ORDER — CIPROFLOXACIN IN D5W 400 MG/200ML IV SOLN
400.0000 mg | INTRAVENOUS | Status: AC
  Administered 2023-03-21: 400 mg via INTRAVENOUS
  Filled 2023-03-21: qty 200

## 2023-03-21 MED ORDER — LACTATED RINGERS IV SOLN
INTRAVENOUS | Status: DC | PRN
Start: 2023-03-21 — End: 2023-03-21

## 2023-03-21 SURGICAL SUPPLY — 31 items
APL SKNCLS STERI-STRIP NONHPOA (GAUZE/BANDAGES/DRESSINGS) ×1
BAG COUNTER SPONGE SURGICOUNT (BAG) IMPLANT
BAG SPNG CNTER NS LX DISP (BAG)
BAG URO CATCHER STRL LF (MISCELLANEOUS) ×2 IMPLANT
BASKET ZERO TIP NITINOL 2.4FR (BASKET) IMPLANT
BENZOIN TINCTURE PRP APPL 2/3 (GAUZE/BANDAGES/DRESSINGS) IMPLANT
BSKT STON RTRVL ZERO TP 2.4FR (BASKET)
CATH URETERAL DUAL LUMEN 10F (MISCELLANEOUS) ×2 IMPLANT
CATH URETL OPEN END 6FR 70 (CATHETERS) IMPLANT
CLOTH BEACON ORANGE TIMEOUT ST (SAFETY) ×2 IMPLANT
DRSG TEGADERM 2-3/8X2-3/4 SM (GAUZE/BANDAGES/DRESSINGS) IMPLANT
GLOVE SS BIOGEL STRL SZ 7 (GLOVE) ×2 IMPLANT
GLOVE SURG LX STRL 7.5 STRW (GLOVE) ×2 IMPLANT
GOWN STRL REUS W/ TWL XL LVL3 (GOWN DISPOSABLE) ×2 IMPLANT
GOWN STRL REUS W/TWL XL LVL3 (GOWN DISPOSABLE) ×1
GUIDEWIRE STR DUAL SENSOR (WIRE) ×2 IMPLANT
GUIDEWIRE ZIPWRE .038 STRAIGHT (WIRE) ×2 IMPLANT
IV NS 1000ML (IV SOLUTION)
IV NS 1000ML BAXH (IV SOLUTION) ×2 IMPLANT
KIT TURNOVER KIT A (KITS) IMPLANT
LASER FIB FLEXIVA PULSE ID 365 (Laser) IMPLANT
MANIFOLD NEPTUNE II (INSTRUMENTS) ×2 IMPLANT
PACK CYSTO (CUSTOM PROCEDURE TRAY) ×2 IMPLANT
PAD PREP 24X48 CUFFED NSTRL (MISCELLANEOUS) ×2 IMPLANT
SHEATH NAV HD 11/13X46 (SHEATH) IMPLANT
SHEATH NAVIGATOR HD 11/13X36 (SHEATH) IMPLANT
STENT URET 6FRX24 CONTOUR (STENTS) IMPLANT
TRACTIP FLEXIVA PULS ID 200XHI (Laser) IMPLANT
TRACTIP FLEXIVA PULSE ID 200 (Laser) ×1
TUBING CONNECTING 10 (TUBING) ×2 IMPLANT
TUBING UROLOGY SET (TUBING) ×2 IMPLANT

## 2023-03-21 NOTE — H&P (Signed)
H&P  History of Present Illness: Richard Andrews is a 62 y.o. year old M who presents today for treatment of a bilateral ureteral stones s/p bilateral ureteral stent placement in an urgent fashion in August  No acute complaints  Past Medical History:  Diagnosis Date   Allergy    Arthritis    Distal radius fracture, left    Hepatic steatosis    Hypertension    Kidney stone 02/16/2023   Substance abuse (HCC)     Past Surgical History:  Procedure Laterality Date   BIOPSY  01/22/2021   Procedure: BIOPSY;  Surgeon: Shellia Cleverly, DO;  [facility]: [facility];  Service: Gastroenterology;;   CYSTOSCOPY W/ URETERAL STENT PLACEMENT Bilateral 01/23/2023   Procedure: 1. Cystoscopy 2. Bilateral  retrograde pyelogram with interpretation 3. bilateral ureteral stent placement (6x24 cm on the right, 6x26 cm on the left) 4. Fluoroscopy <1 hour with intraoperative interpretation;  Surgeon: Despina Arias, MD;  Location: [facility] OR;  Service: Urology;  Laterality: Bilateral;   ESOPHAGOGASTRODUODENOSCOPY (EGD) WITH PROPOFOL N/A 01/22/2021   Procedure: ESOPHAGOGASTRODUODENOSCOPY (EGD) WITH PROPOFOL;  Surgeon: Shellia Cleverly, DO;  Location: [facility];  Service: Gastroenterology;  Laterality: N/A;   I & D EXTREMITY Right 01/10/2020   Procedure: ORIF RIGHT WRIST;  Surgeon: Dominica Severin, MD;  Location: [facility];  Service: Orthopedics;  Laterality: Right;   IR ANGIO EXTERNAL CAROTID SEL EXT CAROTID UNI R MOD SED  10/04/2022   IR ANGIO INTRA EXTRACRAN SEL INTERNAL CAROTID UNI R MOD SED  09/30/2022   IR ANGIOGRAM FOLLOW UP STUDY  09/30/2022   IR NEURO EACH ADD'L AFTER BASIC UNI RIGHT (MS)  10/04/2022   IR TRANSCATH/EMBOLIZ  09/30/2022   OPEN REDUCTION INTERNAL FIXATION (ORIF) DISTAL RADIAL FRACTURE Left 01/28/2015   Procedure: OPEN REDUCTION INTERNAL FIXATION (ORIF) LEFT DISTAL RADIAL FRACTURE;  Surgeon: Tarry Kos, MD;  Location: [facility];  Service: Orthopedics;  Laterality: Left;   ORIF  HUMERUS FRACTURE Right 09/27/2022   Procedure: OPEN REDUCTION INTERNAL FIXATION (ORIF) PROXIMAL HUMERUS FRACTURE;  Surgeon: Teryl Lucy, MD;  Location: [facility];  Service: Orthopedics;  Laterality: Right;   RADIOLOGY WITH ANESTHESIA N/A 09/30/2022   Procedure: IR WITH ANESTHESIA MMA EMBOLIZATION;  Surgeon: Radiologist, Medication, MD;  Location: [facility];  Service: Radiology;  Laterality: N/A;   TONSILLECTOMY      Home Medications:  Current Meds  Medication Sig   acetaminophen (TYLENOL) 325 MG tablet Take 2 tablets (650 mg total) by mouth every 6 (six) hours as needed for mild pain (or Fever >/= 101).   amoxicillin (AMOXIL) 500 MG capsule Take 500 mg by mouth 3 (three) times daily.   DULoxetine (CYMBALTA) 30 MG capsule Take 1 capsule (30mg ) daily for one week then increase to 1 tablet (30mg ) twice daily. (Patient taking differently: Take 30 mg by mouth daily.)   lisinopril-hydrochlorothiazide (ZESTORETIC) 10-12.5 MG tablet Take 1 tablet by mouth daily.   mirtazapine (REMERON) 15 MG tablet Take 15 mg by mouth at bedtime.   pantoprazole (PROTONIX) 40 MG tablet Take 1 tablet (40 mg total) by mouth daily.    Allergies: No Known Allergies  Family History  Problem Relation Age of Onset   Heart disease Mother    Hyperlipidemia Mother    Skin cancer Mother    Cancer Father    Multiple myeloma Sister    Throat cancer Sister     Social History:  reports that he has been smoking cigarettes. He has a 10  pack-year smoking history. His smokeless tobacco use includes chew. He reports current alcohol use of about 16.0 - 20.0 standard drinks of alcohol per week. He reports that he does not use drugs.  ROS: A complete review of systems was performed.  All systems are negative except for pertinent findings as noted.  Physical Exam:  Vital signs in last 24 hours: Temp:  [98.6 F (37 C)] 98.6 F (37 C) (10/08 0802) Pulse Rate:  [91] 91 (10/08 0802) Resp:  [15] 15 (10/08 0802) BP: (138)/(103) 138/103  (10/08 0802) SpO2:  [96 %] 96 % (10/08 0802) Weight:  [59.1 kg] 59.1 kg (10/08 0743) Constitutional:  Alert and oriented, No acute distress Cardiovascular: Regular rate and rhythm Respiratory: Normal respiratory effort, Lungs clear bilaterally GI: Abdomen is soft, nontender, nondistended, no abdominal masses Lymphatic: No lymphadenopathy Neurologic: Grossly intact, no focal deficits Psychiatric: Normal mood and affect   Laboratory Data:  No results for input(s): "WBC", "HGB", "HCT", "PLT" in the last 72 hours.  No results for input(s): "NA", "K", "CL", "GLUCOSE", "BUN", "CALCIUM", "CREATININE" in the last 72 hours.  Invalid input(s): "CO3"   No results found for this or any previous visit (from the past 24 hour(s)). No results found for this or any previous visit (from the past 240 hour(s)).  Renal Function: No results for input(s): "CREATININE" in the last 168 hours. Estimated Creatinine Clearance: 79 mL/min (by C-G formula based on SCr of 0.79 mg/dL).  Radiologic Imaging: No results found.  Assessment:  Richard Andrews is a 62 y.o. year old M with bilateral ureteral stones s/p bilateral ureteral stent placement in August  Plan:  --to OR as planned for unilateral vs bilateral ureteroscopy with laser litho, stent. Procedure and risks reviewed, including but not limited to hematuria, infection, sepsis, damage to GU tract, failure to complete procedure, retained stone fragments, need for future/staged procedures, stent pain, prolonged stent.   Irine Seal, MD 03/21/2023, 8:36 AM  [facility] Pager: [phone]

## 2023-03-21 NOTE — Anesthesia Postprocedure Evaluation (Signed)
Anesthesia Post Note  Patient: KEN BONN  Procedure(s) Performed: BILATERAL URETEROSCOPY/HOLMIUM LASER/STENT PLACEMENT (Bilateral)     Patient location during evaluation: PACU Anesthesia Type: General Level of consciousness: awake and alert Pain management: pain level controlled Vital Signs Assessment: post-procedure vital signs reviewed and stable Respiratory status: spontaneous breathing, nonlabored ventilation and respiratory function stable Cardiovascular status: stable and blood pressure returned to baseline Anesthetic complications: no   No notable events documented.  Last Vitals:  Vitals:   03/21/23 1100 03/21/23 1125  BP: (!) 157/106 (!) 145/103  Pulse: 68 83  Resp: 16 14  Temp:  36.9 C  SpO2: 98% 100%    Last Pain:  Vitals:   03/21/23 1125  TempSrc:   PainSc: 0-No pain                 Beryle Lathe

## 2023-03-21 NOTE — Transfer of Care (Signed)
Immediate Anesthesia Transfer of Care Note  Patient: Richard Andrews  Procedure(s) Performed: BILATERAL URETEROSCOPY/HOLMIUM LASER/STENT PLACEMENT (Bilateral)  Patient Location: PACU  Anesthesia Type:General  Level of Consciousness: drowsy  Airway & Oxygen Therapy: Patient Spontanous Breathing and Patient connected to face mask oxygen  Post-op Assessment: Report given to RN and Post -op Vital signs reviewed and stable  Post vital signs: Reviewed and stable  Last Vitals:  Vitals Value Taken Time  BP 155/101 03/21/23 1020  Temp    Pulse 64 03/21/23 1021  Resp 12 03/21/23 1021  SpO2 100 % 03/21/23 1021  Vitals shown include unfiled device data.  Last Pain:  Vitals:   03/21/23 0802  TempSrc: Oral         Complications: No notable events documented.

## 2023-03-21 NOTE — Discharge Instructions (Addendum)
[facility] [phone] Post Ureteroscopy With or Without Stent Instructions **remove stent by pulling on strings on Monday** Definitions:  Ureter: The duct that transports urine from the kidney to the bladder. Stent:   A plastic hollow tube that is placed into the ureter, from the kidney to the bladder to prevent the ureter from swelling shut.  GENERAL INSTRUCTIONS:  Despite the fact that no skin incisions were used, the area around the ureter and bladder is raw and irritated. The stent is a foreign body which will further irritate the bladder wall. This irritation is manifested by increased frequency of urination, both day and night, and by an increase in the urge to urinate. In some, the urge to urinate is present almost always. Sometimes the urge is strong enough that you may not be able to stop yourself from urinating. The only real cure is to remove the stent and then give time for the bladder wall to heal which can't be done until the danger of the ureter swelling shut has passed, which varies.  You may see some blood in your urine while the stent is in place and a few days afterwards. Do not be alarmed, even if the urine was clear for a while. Get off your feet and drink lots of fluids until clearing occurs. If you start to pass clots or don't improve, call us.  DIET: You may return to your normal diet immediately. Because of the raw surface of your bladder, alcohol, spicy foods, acid type foods and drinks with caffeine may cause irritation or frequency and should be used in moderation. To keep your urine flowing freely and to avoid constipation, drink plenty of fluids during the day ( 8-10 glasses ). Tip: Avoid cranberry juice because it is very acidic.  ACTIVITY: Your physical activity doesn't need to be restricted. However, if you are very active, you may see some blood in your urine. We suggest that you reduce your activity under these circumstances until the  bleeding has stopped.  BOWELS: It is important to keep your bowels regular during the postoperative period. Straining with bowel movements can cause bleeding. A bowel movement every other day is reasonable. Use a mild laxative if needed, such as Milk of Magnesia 2-3 tablespoons, or 2 Dulcolax tablets. Call if you continue to have problems. If you have been taking narcotics for pain, before, during or after your surgery, you may be constipated. Take a laxative if necessary.   MEDICATION: You should resume your pre-surgery medications unless told not to. In addition you will often be given an antibiotic to prevent infection and likely several as needed medications for stent related discomfort. These should be taken as prescribed until the bottles are finished unless you are having an unusual reaction to one of the drugs.  PROBLEMS YOU SHOULD REPORT TO [place]: Fevers over 100.5 Fahrenheit. Heavy bleeding, or clots ( See above notes about blood in urine ). Inability to urinate. Drug reactions ( hives, rash, nausea, vomiting, diarrhea ). Severe burning or pain with urination that is not improving.

## 2023-03-21 NOTE — Anesthesia Procedure Notes (Signed)
Procedure Name: LMA Insertion Date/Time: 03/21/2023 9:12 AM  Performed by: Florene Route, CRNAPatient Re-evaluated:Patient Re-evaluated prior to induction Oxygen Delivery Method: Circle system utilized Preoxygenation: Pre-oxygenation with 100% oxygen Induction Type: IV induction LMA: LMA inserted LMA Size: 4.0 Number of attempts: 1 Placement Confirmation: positive ETCO2 and breath sounds checked- equal and bilateral Tube secured with: Tape Dental Injury: Teeth and Oropharynx as per pre-operative assessment

## 2023-03-21 NOTE — Op Note (Signed)
Operative Note  Preoperative diagnosis:  1.  Bilateral ureteral stones 2.  History of UTI 3. Bilateral ureteral stents  Postoperative diagnosis: 1.   same  Procedure(s): 1.  Bilateral ureteroscopy with laser lithotripsy and basket extraction of stones 2. Cystoscopy  3. Bilateral retrograde pyelogram 4. Bilateral ureteral stent exchange 5. Fluoroscopy with intraoperative interpretation  Surgeon: Irine Seal, MD  Assistants:  None  Anesthesia:  General  Complications:  None  EBL:  Minimal  Specimens: 1. None - stones dusted  Drains/Catheters: 1.  Bilateral 6Fr x 24cm ureteral stent with tether string  Intraoperative findings:   Cystoscopy demonstrated no new/acute findings [place] Ureteroscopy demonstrated stone in each collecting system and some clots Successful stent placement.  Description of procedure: After informed consent was obtained from the patient, the patient was identified and taken to the operating room and placed in the supine position.  General anesthesia was administered as well as perioperative IV antibiotics.  At the beginning of the case, a time-out was performed to properly identify the patient, the surgery to be performed, and the surgical site.  Sequential compression devices were applied to the lower extremities at the beginning of the case for DVT prophylaxis.  The patient was then placed in the dorsal lithotomy supine position, prepped and draped in sterile fashion.  We then passed the 21-French rigid cystoscope through the urethra and into the bladder under vision without any difficulty , noting a normal urethra without strictures and a mildly obstructing prostate.  A systematic evaluation of the bladder revealed no evidence of any suspicious bladder lesions.  Ureteral orifices were in normal position.    The distal aspect of the ureteral stent was seen protruding from the right ureteral orifice.  We then used the alligator-tooth forceps and  grasped the distal end of the ureteral stent and brought it out the urethral meatus while watching the proximal coil straighten out nicely on fluoroscopy. Through the ureteral stent, we then passed a 0.038 glide wire up to the level of the renal pelvis.  The ureteral stent was then removed, leaving the glide wire up the rightureter.  The cystoscope was withdrawn, and a dual lumen catheter was inserted over the glide wire into the distal ureter. A gentle retrograde pyelogram was performed, revealing a normal caliber ureter without any filling defects. There was no hydronephrosis of the collecting system. A 0.038 sensor wire was then passed up to the level of the renal pelvis and secured to the drape as a safety wire. The dual lumen was removed.  An 11/13Fr ureteral access sheath was carefully advanced up the ureter to the level of the UPJ over this wire under fluoroscopic guidance. The flexible ureteroscope was advanced into the collecting system via the access sheath. The collecting system was inspected. The calculus was identified in the lower pole. Using the 242 micron holmium laser fiber, the stone was dusted completely. These were sent for chemical analysis. With the ureteroscope in the kidney, a gentle pyelogram was performed to delineate the calyceal system and we evaluated the calyces systematically. We encountered no further stones. There were some clots in the collecting system. The rest of the stone fragments were very tiny and these were  irrigated away gently. The calyces were re-inspected and there were no significant stone fragment residual.   We then withdrew the ureteroscope back down the ureter along with the access sheath, noting no evidence of any stones along the course of the ureter.  Prior to removing the ureteroscope,  we did pass the Glidewire back up to the ureter to the renal pelvis.  Once the ureteroscope was removed, we then used the Glidewire under fluoroscopic guidance and passed up  a 6-French x 24 cm double-pigtail ureteral stent up the ureter, making sure that the proximal and distal ends coiled within the kidney and bladder respectively.   The procedure was repeated in an identical fashion on the left. Again a stone was found in a lower pole calyx. After dusting the stone, the collecting system was systematically examined and no further stones were identified. A stent was placed in an identical fashion.   Note that we left a tether string attached to the distal end of the ureteral stent and it exited the urethral meatus and was secured to the penile shaft.  The cystoscope was then advanced back into the bladder under vision.  We were able to see the distal stent coiling nicely within the bladder.  The bladder was then emptied with irrigation solution.  The cystoscope was then removed.    The patient tolerated the procedure well and there was no complication. Patient was awoken from anesthesia and taken to the recovery room in stable condition. I was present and scrubbed for the entirety of the case.  Plan:  Patient will be discharged home and may remove stent on monday   Neva Seat MD [facility]  Pager: [phone]

## 2023-03-22 ENCOUNTER — Encounter (HOSPITAL_COMMUNITY): Payer: Self-pay | Admitting: Urology

## 2023-03-28 ENCOUNTER — Telehealth: Payer: Self-pay | Admitting: Medical-Surgical

## 2023-03-28 NOTE — Telephone Encounter (Signed)
[employer] [facility] called in stating that they needed office notes from April to now faxed.  Fax: [phone]

## 2023-03-31 IMAGING — CT CT L SPINE W/O CM
3 of 5 series · 11 of 33 positions shown, 13 images · non-contrast
Comparison: None.

CLINICAL DATA: Initial evaluation for low back pain status post
trauma.

EXAM:
CT LUMBAR SPINE WITHOUT CONTRAST
TECHNIQUE: Multidetector CT imaging of the lumbar spine was performed without
intravenous contrast administration. Multiplanar CT image
reconstructions were also generated.

[Series 6: l spine soft · axial · 0.37mm/px · z∈[-742,-528]mm · 5 of 155 slices shown, 7 images]
[im 24/155  soft-tissue]
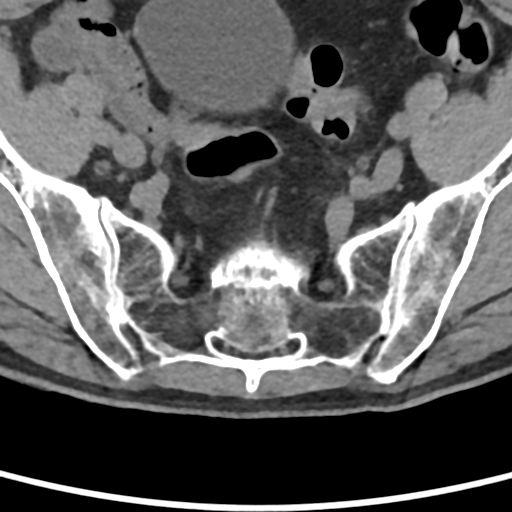
[im 24/155  bone]
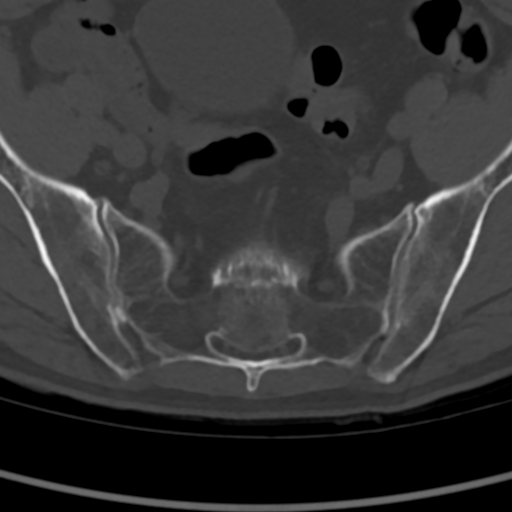
[im 48/155  bone]
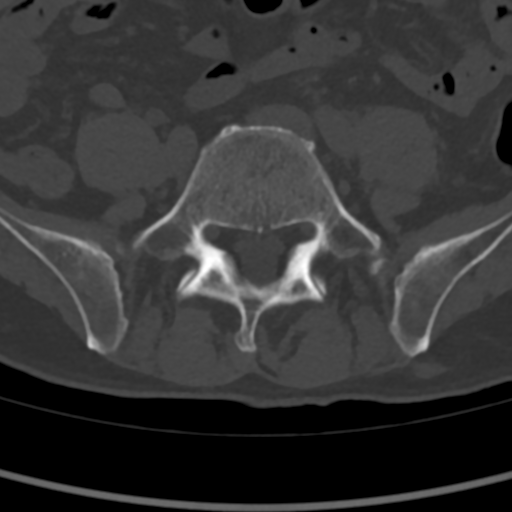
[im 83/155  bone]
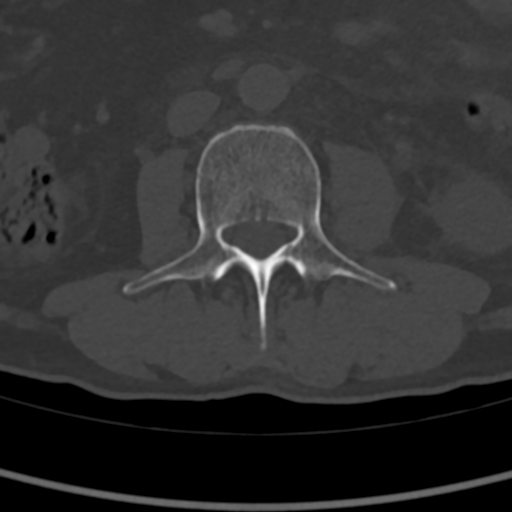
[im 107/155  bone]
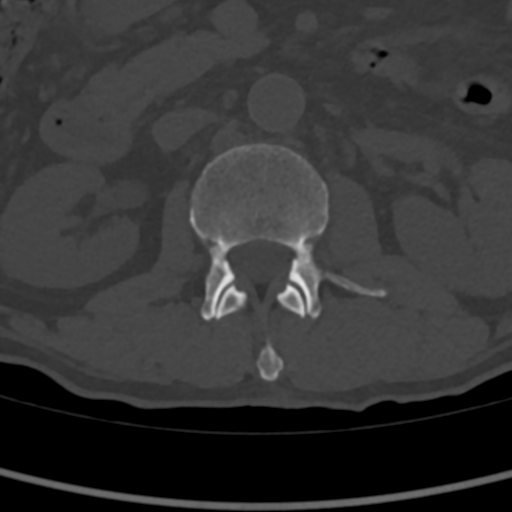
[im 131/155  soft-tissue]
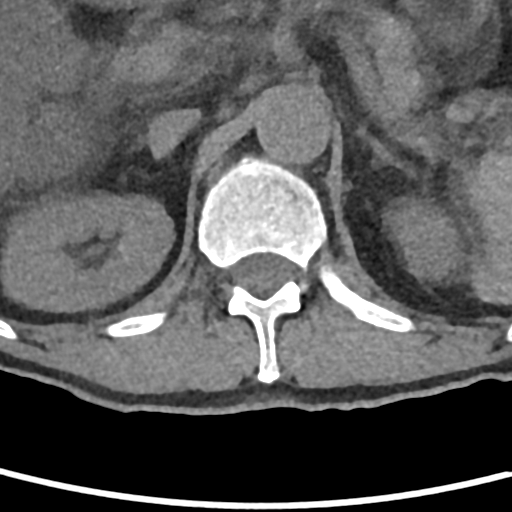
[im 131/155  bone]
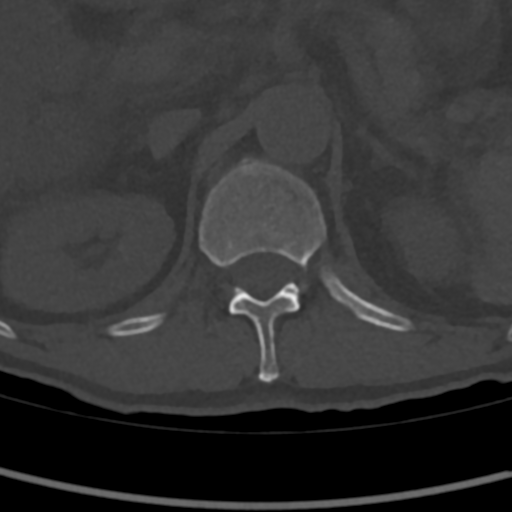

[Series 7: sag bone · sagittal · 0.33mm/px · 5 of 86 slices shown]
[im 15/86  bone]
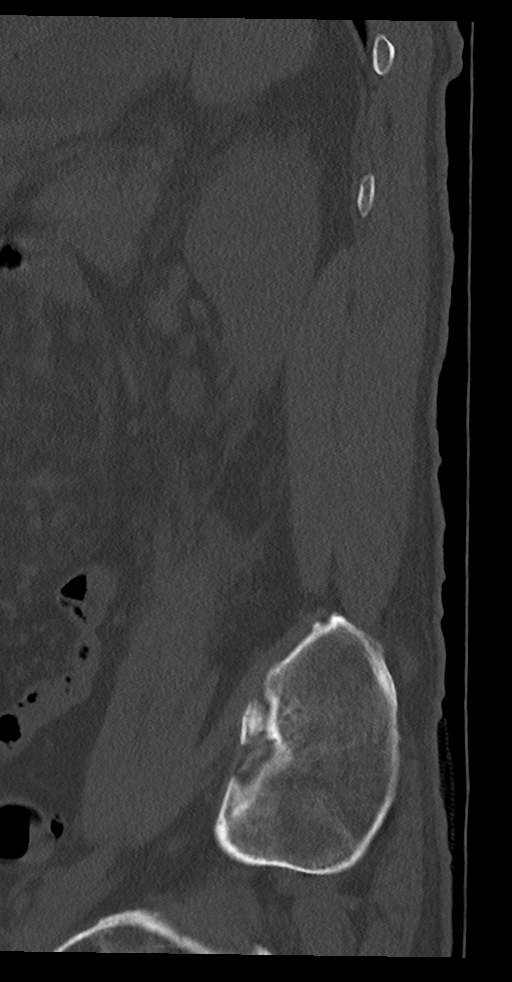
[im 29/86  bone]
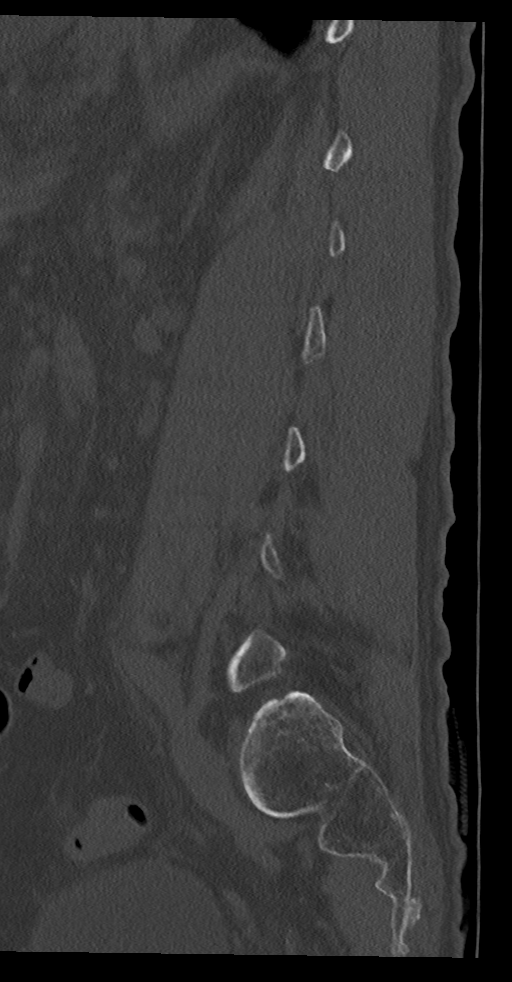
[im 43/86  bone]
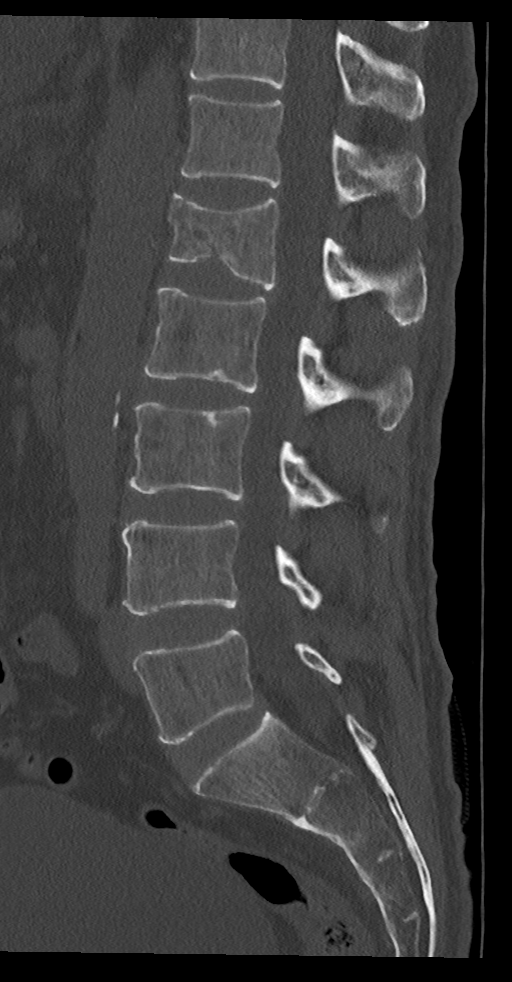
[im 57/86  bone]
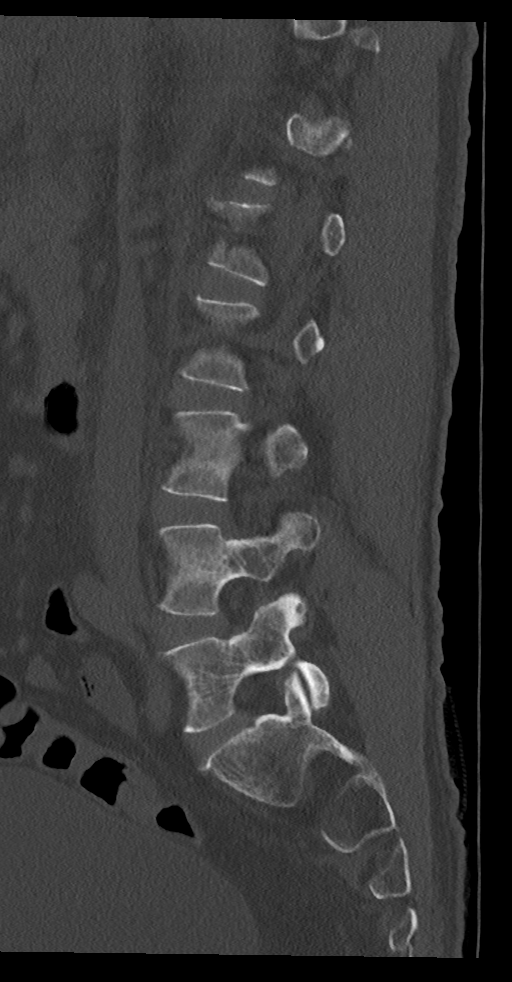
[im 71/86  bone]
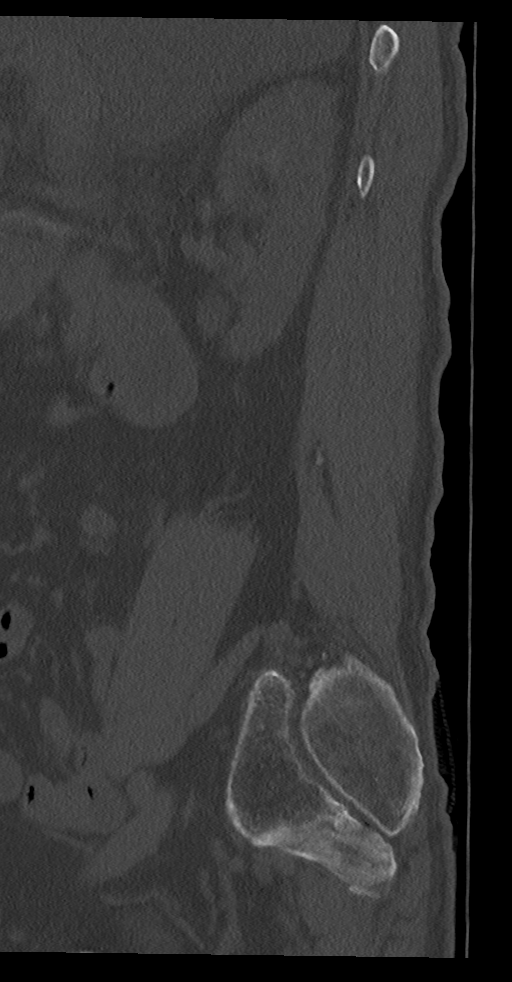

[Series 8: cor bone · coronal · 0.35mm/px · 1 of 79 slices shown]
[im 40/79  bone]
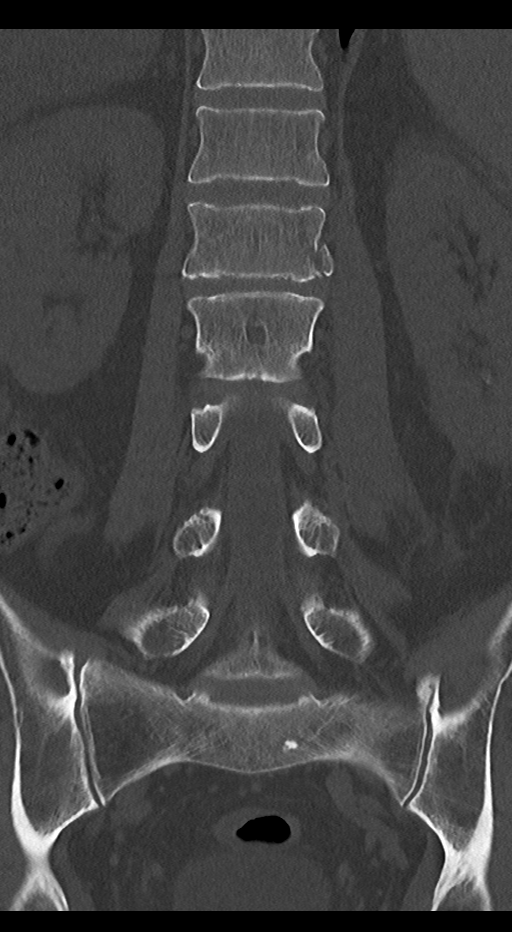

[11 of 33 positions shown; findings below may reference images not displayed]

FINDINGS: Segmentation: Standard. Lowest well-formed disc space labeled the
L5-S1 level.

Alignment: Physiologic with preservation of the normal lumbar
lordosis. No listhesis.

Vertebrae: Acute compression fracture involving the L1 vertebral
body is seen. Associated central height loss measures up to nearly
50% without significant bony retropulsion. Vertebral body height
otherwise maintained. Visualized sacrum and pelvis intact. Small
benign bone island noted within the left sacrum. No other discrete
or worrisome osseous lesions.

Paraspinal and other soft tissues: Scattered soft tissue stranding
and edema seen adjacent to the L1 compression fracture, extending
along the left greater than right psoas musculature. Bilateral
nonobstructive nephrolithiasis. Hepatic steatosis. Mild aortic
atherosclerosis. Misty mesenteric stranding noted as well.

Disc levels:

L1-2: Mild diffuse disc bulge, eccentric to the right. No spinal
stenosis. Foramina remain patent.

L2-3: Mild disc bulge, eccentric to the right. Superimposed small
right foraminal disc protrusion extends into the inferior right
neural foramen (series 6, image 65). Mild facet hypertrophy. No
significant spinal stenosis. Foramina remain patent.

L3-4: Minimal disc bulge with mild facet hypertrophy. No canal or
foraminal stenosis.

L4-5:  Minimal disc bulge with mild facet hypertrophy.  No stenosis.

L5-S1: Minimal disc bulge with mild facet hypertrophy. No stenosis.
IMPRESSION: 1. Acute compression fracture involving the L1 vertebral body with
up to nearly 50% central height loss without significant bony
retropulsion.
2. No other acute traumatic injury within the lumbar spine.
3. Bilateral nonobstructive nephrolithiasis.
4. Hepatic steatosis.
5. Aortic Atherosclerosis ([id number]).

## 2023-04-04 ENCOUNTER — Encounter: Payer: Self-pay | Admitting: Medical-Surgical

## 2023-04-05 NOTE — Telephone Encounter (Signed)
Faxed records from 09/24/2022 to present to [phone]

## 2023-04-12 ENCOUNTER — Other Ambulatory Visit: Payer: Self-pay

## 2023-04-12 ENCOUNTER — Emergency Department (HOSPITAL_BASED_OUTPATIENT_CLINIC_OR_DEPARTMENT_OTHER)
Admission: EM | Admit: 2023-04-12 | Discharge: 2023-04-13 | Disposition: A | Discharge status: Home / Self Care | Payer: BC Managed Care – PPO | Attending: Emergency Medicine | Admitting: Emergency Medicine

## 2023-04-12 ENCOUNTER — Emergency Department (HOSPITAL_BASED_OUTPATIENT_CLINIC_OR_DEPARTMENT_OTHER): Payer: BC Managed Care – PPO | Admitting: Radiology

## 2023-04-12 DIAGNOSIS — R531 Weakness: Secondary | ICD-10-CM | POA: Insufficient documentation

## 2023-04-12 DIAGNOSIS — Y908 Blood alcohol level of 240 mg/100 ml or more: Secondary | ICD-10-CM | POA: Insufficient documentation

## 2023-04-12 DIAGNOSIS — R4182 Altered mental status, unspecified: Secondary | ICD-10-CM | POA: Diagnosis present

## 2023-04-12 DIAGNOSIS — Z79899 Other long term (current) drug therapy: Secondary | ICD-10-CM | POA: Insufficient documentation

## 2023-04-12 DIAGNOSIS — R296 Repeated falls: Secondary | ICD-10-CM

## 2023-04-12 DIAGNOSIS — R32 Unspecified urinary incontinence: Secondary | ICD-10-CM | POA: Insufficient documentation

## 2023-04-12 DIAGNOSIS — W19XXXA Unspecified fall, initial encounter: Secondary | ICD-10-CM | POA: Insufficient documentation

## 2023-04-12 DIAGNOSIS — R109 Unspecified abdominal pain: Secondary | ICD-10-CM | POA: Insufficient documentation

## 2023-04-12 DIAGNOSIS — I10 Essential (primary) hypertension: Secondary | ICD-10-CM | POA: Insufficient documentation

## 2023-04-12 DIAGNOSIS — R262 Difficulty in walking, not elsewhere classified: Secondary | ICD-10-CM | POA: Diagnosis not present

## 2023-04-12 DIAGNOSIS — F10920 Alcohol use, unspecified with intoxication, uncomplicated: Secondary | ICD-10-CM

## 2023-04-12 DIAGNOSIS — R0602 Shortness of breath: Secondary | ICD-10-CM | POA: Insufficient documentation

## 2023-04-12 LAB — BASIC METABOLIC PANEL
Anion gap: 14 (ref 5–15)
BUN: 6 mg/dL — ABNORMAL LOW (ref 8–23)
CO2: 25 mmol/L (ref 22–32)
Calcium: 9 mg/dL (ref 8.9–10.3)
Chloride: 102 mmol/L (ref 98–111)
Creatinine, Ser: 0.77 mg/dL (ref 0.61–1.24)
GFR, Estimated: >60 mL/min (ref >60)
Glucose, Bld: 101 mg/dL — ABNORMAL HIGH (ref 70–99)
Potassium: 3 mmol/L — ABNORMAL LOW (ref 3.5–5.1)
Sodium: 141 mmol/L (ref 135–145)

## 2023-04-12 LAB — CBC
HCT: 34.3 % — ABNORMAL LOW (ref 39.0–52.0)
Hemoglobin: 11.6 g/dL — ABNORMAL LOW (ref 13.0–17.0)
MCH: 34.8 pg — ABNORMAL HIGH (ref 26.0–34.0)
MCHC: 33.8 g/dL (ref 30.0–36.0)
MCV: 103 fL — ABNORMAL HIGH (ref 80.0–100.0)
Platelets: 89 10*3/uL — ABNORMAL LOW (ref 150–400)
RBC: 3.33 MIL/uL — ABNORMAL LOW (ref 4.22–5.81)
RDW: 14.6 % (ref 11.5–15.5)
WBC: 4.6 10*3/uL (ref 4.0–10.5)
nRBC: 0 % (ref 0.0–0.2)

## 2023-04-12 LAB — MAGNESIUM: Magnesium: 1.4 mg/dL — ABNORMAL LOW (ref 1.7–2.4)

## 2023-04-12 MED ORDER — ALBUTEROL SULFATE HFA 108 (90 BASE) MCG/ACT IN AERS: 2.0000 | INHALATION_SPRAY | RESPIRATORY_TRACT | Status: DC | PRN

## 2023-04-12 NOTE — ED Provider Notes (Signed)
[place] EMERGENCY DEPARTMENT AT [facility] Provider Note   CSN: [id number] Arrival date & time: 04/12/23  2952     History  Chief Complaint  Patient presents with   Altered Mental Status   Weakness    Richard Andrews is a 62 y.o. male.  Patient is a 62 year old male with past medical history of hypertension, alcohol abuse, prior subdural hematoma, and recent stenting of kidney stones.  Patient brought by his caretaker over concerns of confusion and altered mental status.  According to the caretaker, patient has been having difficulty walking, complaining of shortness of breath, and has had trouble making it to the bathroom.  This has been worsening over the past several weeks.  He recently had a stenting for kidney stones and symptoms seem to become worse afterward.  Patient denies any fevers or chills.  The history is provided by the patient.       Home Medications Prior to Admission medications   Medication Sig Start Date End Date Taking? Authorizing Provider  acetaminophen (TYLENOL) 325 MG tablet Take 2 tablets (650 mg total) by mouth every 6 (six) hours as needed for mild pain (or Fever >/= 101). 02/02/23   Ghimire, Werner Lean, MD  amoxicillin (AMOXIL) 500 MG capsule Take 500 mg by mouth 3 (three) times daily.    [provider]  celecoxib (CELEBREX) 200 MG capsule Take 1 capsule (200 mg total) by mouth 2 (two) times daily for 28 days. 03/21/23 04/18/23  Despina Arias, MD  DULoxetine (CYMBALTA) 30 MG capsule Take 1 capsule (30mg ) daily for one week then increase to 1 tablet (30mg ) twice daily. Patient taking differently: Take 30 mg by mouth daily. 01/06/23   [name], NP  hyoscyamine (LEVSIN/SL) 0.125 MG SL tablet Place 1 tablet (0.125 mg total) under the tongue every 4 (four) hours as needed. 03/21/23   Despina Arias, MD  lisinopril-hydrochlorothiazide (ZESTORETIC) 10-12.5 MG tablet Take 1 tablet by mouth daily. 03/08/23   [name], NP  mirtazapine  (REMERON) 15 MG tablet Take 15 mg by mouth at bedtime.    [provider]  pantoprazole (PROTONIX) 40 MG tablet Take 1 tablet (40 mg total) by mouth daily. 11/28/22   Angiulli, Mcarthur Rossetti, PA-C  traMADol (ULTRAM) 50 MG tablet Take 1 tablet (50 mg total) by mouth every 6 (six) hours as needed. 03/21/23 03/20/24  Despina Arias, MD      Allergies    Patient has no known allergies.    Review of Systems   Review of Systems  All other systems reviewed and are negative.   Physical Exam Updated Vital Signs BP 131/88   Pulse 71   Temp 98.6 F (37 C) (Oral)   Resp 19   Ht 5\' 10"  (1.778 m)   Wt 59 kg   SpO2 93%   BMI 18.65 kg/m  Physical Exam Vitals and nursing note reviewed.  Constitutional:      General: He is not in acute distress.    Appearance: He is well-developed. He is not diaphoretic.  HENT:     Head: Normocephalic and atraumatic.  Cardiovascular:     Rate and Rhythm: Normal rate and regular rhythm.     Heart sounds: No murmur heard.    No friction rub.  Pulmonary:     Effort: Pulmonary effort is normal. No respiratory distress.     Breath sounds: Normal breath sounds. No wheezing or rales.  Abdominal:     General: Bowel sounds  are normal. There is no distension.     Palpations: Abdomen is soft.     Tenderness: There is no abdominal tenderness.  Musculoskeletal:        General: Normal range of motion.     Cervical back: Normal range of motion and neck supple.  Skin:    General: Skin is warm and dry.  Neurological:     Mental Status: He is alert and oriented to person, place, and time.     Coordination: Coordination normal.     ED Results / Procedures / Treatments   Labs (all labs ordered are listed, but only abnormal results are displayed) Labs Reviewed  BASIC METABOLIC PANEL - Abnormal; Notable for the following components:      Result Value   Potassium 3.0 (*)    Glucose, Bld 101 (*)    BUN 6 (*)    All other components within normal limits  CBC  - Abnormal; Notable for the following components:   RBC 3.33 (*)    Hemoglobin 11.6 (*)    HCT 34.3 (*)    MCV 103.0 (*)    MCH 34.8 (*)    Platelets 89 (*)    All other components within normal limits  MAGNESIUM - Abnormal; Notable for the following components:   Magnesium 1.4 (*)    All other components within normal limits  URINALYSIS, ROUTINE W REFLEX MICROSCOPIC  ETHANOL  CBG MONITORING, ED    EKG None  Radiology DG Chest 2 View  Result Date: 04/12/2023 CLINICAL DATA:  Shortness of breath. EXAM: CHEST - 2 VIEW COMPARISON:  Chest radiographs 09/24/2022 and 01/23/2021; CT chest 11/15/2022 FINDINGS: Cardiac silhouette and mediastinal contours are within normal limits. Mild curvilinear subsegmental atelectasis versus scarring within the left lower lung. Focal calcific density overlying the inferior medial right lung on frontal view and the posteroinferior lungs on lateral view corresponding to the known benign calcified granuloma seen on prior 11/15/2022 CT. No acute airspace opacity to indicate pneumonia. No pleural effusion or pneumothorax. Mild-to-moderate multilevel degenerative disc changes of the superior thoracic spine. Severe anterior height loss of the T6 vertebral body, chronic. Severe height loss of the mid to anterior L1 vertebral body, also chronic. Multiple bilateral healed rib fractures are again noted. IMPRESSION: Mild curvilinear subsegmental atelectasis versus scarring within the left lower lung. No significant acute airspace opacity. Electronically Signed   By: Neita Garnet M.D.   On: 04/12/2023 21:00    Procedures Procedures  {Document cardiac monitor, telemetry assessment procedure when appropriate:1}  Medications Ordered in ED Medications  albuterol (VENTOLIN HFA) 108 (90 Base) MCG/ACT inhaler 2 puff (has no administration in time range)    ED Course/ Medical Decision Making/ A&P   {   Click here for ABCD2, HEART and other calculatorsREFRESH Note before  signing :1}                              Medical Decision Making Amount and/or Complexity of Data Reviewed Labs: ordered. Radiology: ordered.  Risk Prescription drug management.   ***  {Document critical care time when appropriate:1} {Document review of labs and clinical decision tools ie heart score, Chads2Vasc2 etc:1}  {Document your independent review of radiology images, and any outside records:1} {Document your discussion with family members, caretakers, and with consultants:1} {Document social determinants of health affecting pt's care:1} {Document your decision making why or why not admission, treatments were needed:1} Final Clinical Impression(s) / ED  Diagnoses Final diagnoses:  None    Rx / DC Orders ED Discharge Orders     None

## 2023-04-12 NOTE — ED Triage Notes (Addendum)
Pt POV with brother in law, per family pt experiencing SOB, confusion weakness, and incontinence x3 days. Pt axo x4 currently, endorses weakness.

## 2023-04-13 ENCOUNTER — Emergency Department (HOSPITAL_BASED_OUTPATIENT_CLINIC_OR_DEPARTMENT_OTHER): Payer: BC Managed Care – PPO

## 2023-04-13 ENCOUNTER — Other Ambulatory Visit: Payer: Self-pay

## 2023-04-13 LAB — URINALYSIS, ROUTINE W REFLEX MICROSCOPIC
Bilirubin Urine: NEGATIVE
Glucose, UA: NEGATIVE mg/dL
Hgb urine dipstick: NEGATIVE
Ketones, ur: NEGATIVE mg/dL
Leukocytes,Ua: NEGATIVE
Nitrite: NEGATIVE
Protein, ur: NEGATIVE mg/dL
Specific Gravity, Urine: 1.006 (ref 1.005–1.030)
pH: 7 (ref 5.0–8.0)

## 2023-04-13 LAB — ETHANOL: Alcohol, Ethyl (B): 297 mg/dL — ABNORMAL HIGH (ref <10)

## 2023-04-13 LAB — BRAIN NATRIURETIC PEPTIDE: B Natriuretic Peptide: 36.5 pg/mL (ref 0.0–100.0)

## 2023-04-13 LAB — TROPONIN I (HIGH SENSITIVITY): Troponin I (High Sensitivity): 4 ng/L (ref <18)

## 2023-04-13 NOTE — Discharge Instructions (Signed)
Follow-up with your primary doctor in the next few days, and return to the ER if your symptoms significantly worsen or change.

## 2023-05-03 ENCOUNTER — Emergency Department (HOSPITAL_COMMUNITY): Payer: BC Managed Care – PPO

## 2023-05-03 ENCOUNTER — Encounter (HOSPITAL_COMMUNITY): Payer: Self-pay | Admitting: Emergency Medicine

## 2023-05-03 ENCOUNTER — Inpatient Hospital Stay (HOSPITAL_COMMUNITY)
Admission: EM | Admit: 2023-05-03 | Discharge: 2023-05-09 | DRG: 083 | Disposition: A | Discharge status: Home Health | Payer: BC Managed Care – PPO | Attending: Internal Medicine | Admitting: Internal Medicine

## 2023-05-03 ENCOUNTER — Other Ambulatory Visit: Payer: Self-pay

## 2023-05-03 DIAGNOSIS — E222 Syndrome of inappropriate secretion of antidiuretic hormone: Secondary | ICD-10-CM | POA: Diagnosis not present

## 2023-05-03 DIAGNOSIS — Y907 Blood alcohol level of 200-239 mg/100 ml: Secondary | ICD-10-CM | POA: Diagnosis present

## 2023-05-03 DIAGNOSIS — Z79899 Other long term (current) drug therapy: Secondary | ICD-10-CM

## 2023-05-03 DIAGNOSIS — Z808 Family history of malignant neoplasm of other organs or systems: Secondary | ICD-10-CM

## 2023-05-03 DIAGNOSIS — D6959 Other secondary thrombocytopenia: Secondary | ICD-10-CM | POA: Diagnosis not present

## 2023-05-03 DIAGNOSIS — E639 Nutritional deficiency, unspecified: Secondary | ICD-10-CM | POA: Diagnosis present

## 2023-05-03 DIAGNOSIS — R531 Weakness: Secondary | ICD-10-CM

## 2023-05-03 DIAGNOSIS — K760 Fatty (change of) liver, not elsewhere classified: Secondary | ICD-10-CM | POA: Diagnosis not present

## 2023-05-03 DIAGNOSIS — Z960 Presence of urogenital implants: Secondary | ICD-10-CM | POA: Diagnosis not present

## 2023-05-03 DIAGNOSIS — I6203 Nontraumatic chronic subdural hemorrhage: Secondary | ICD-10-CM | POA: Diagnosis present

## 2023-05-03 DIAGNOSIS — E860 Dehydration: Secondary | ICD-10-CM | POA: Diagnosis not present

## 2023-05-03 DIAGNOSIS — M25511 Pain in right shoulder: Secondary | ICD-10-CM | POA: Diagnosis not present

## 2023-05-03 DIAGNOSIS — F1010 Alcohol abuse, uncomplicated: Secondary | ICD-10-CM | POA: Diagnosis present

## 2023-05-03 DIAGNOSIS — Z807 Family history of other malignant neoplasms of lymphoid, hematopoietic and related tissues: Secondary | ICD-10-CM | POA: Diagnosis not present

## 2023-05-03 DIAGNOSIS — Z83438 Family history of other disorder of lipoprotein metabolism and other lipidemia: Secondary | ICD-10-CM

## 2023-05-03 DIAGNOSIS — Z9089 Acquired absence of other organs: Secondary | ICD-10-CM

## 2023-05-03 DIAGNOSIS — Z8781 Personal history of (healed) traumatic fracture: Secondary | ICD-10-CM

## 2023-05-03 DIAGNOSIS — I10 Essential (primary) hypertension: Secondary | ICD-10-CM | POA: Diagnosis not present

## 2023-05-03 DIAGNOSIS — Z91199 Patient's noncompliance with other medical treatment and regimen due to unspecified reason: Secondary | ICD-10-CM

## 2023-05-03 DIAGNOSIS — K7030 Alcoholic cirrhosis of liver without ascites: Secondary | ICD-10-CM | POA: Diagnosis not present

## 2023-05-03 DIAGNOSIS — E876 Hypokalemia: Secondary | ICD-10-CM | POA: Diagnosis not present

## 2023-05-03 DIAGNOSIS — R296 Repeated falls: Secondary | ICD-10-CM | POA: Diagnosis present

## 2023-05-03 DIAGNOSIS — D696 Thrombocytopenia, unspecified: Secondary | ICD-10-CM

## 2023-05-03 DIAGNOSIS — Z8249 Family history of ischemic heart disease and other diseases of the circulatory system: Secondary | ICD-10-CM | POA: Diagnosis not present

## 2023-05-03 DIAGNOSIS — Z635 Disruption of family by separation and divorce: Secondary | ICD-10-CM

## 2023-05-03 DIAGNOSIS — R7989 Other specified abnormal findings of blood chemistry: Secondary | ICD-10-CM | POA: Diagnosis not present

## 2023-05-03 DIAGNOSIS — F17210 Nicotine dependence, cigarettes, uncomplicated: Secondary | ICD-10-CM | POA: Diagnosis present

## 2023-05-03 DIAGNOSIS — S065XAA Traumatic subdural hemorrhage with loss of consciousness status unknown, initial encounter: Principal | ICD-10-CM | POA: Diagnosis present

## 2023-05-03 DIAGNOSIS — D61818 Other pancytopenia: Secondary | ICD-10-CM | POA: Diagnosis not present

## 2023-05-03 DIAGNOSIS — Z809 Family history of malignant neoplasm, unspecified: Secondary | ICD-10-CM

## 2023-05-03 DIAGNOSIS — Z9889 Other specified postprocedural states: Secondary | ICD-10-CM

## 2023-05-03 DIAGNOSIS — Z87442 Personal history of urinary calculi: Secondary | ICD-10-CM | POA: Diagnosis not present

## 2023-05-03 LAB — CBC WITH DIFFERENTIAL/PLATELET
Abs Immature Granulocytes: 0.03 10*3/uL (ref 0.00–0.07)
Basophils Absolute: 0 10*3/uL (ref 0.0–0.1)
Basophils Relative: 1 %
Eosinophils Absolute: 0 10*3/uL (ref 0.0–0.5)
Eosinophils Relative: 1 %
HCT: 33 % — ABNORMAL LOW (ref 39.0–52.0)
Hemoglobin: 11.5 g/dL — ABNORMAL LOW (ref 13.0–17.0)
Immature Granulocytes: 1 %
Lymphocytes Relative: 26 %
Lymphs Abs: 1 10*3/uL (ref 0.7–4.0)
MCH: 34.7 pg — ABNORMAL HIGH (ref 26.0–34.0)
MCHC: 34.8 g/dL (ref 30.0–36.0)
MCV: 99.7 fL (ref 80.0–100.0)
Monocytes Absolute: 0.5 10*3/uL (ref 0.1–1.0)
Monocytes Relative: 12 %
Neutro Abs: 2.2 10*3/uL (ref 1.7–7.7)
Neutrophils Relative %: 59 %
Platelets: 55 10*3/uL — ABNORMAL LOW (ref 150–400)
RBC: 3.31 MIL/uL — ABNORMAL LOW (ref 4.22–5.81)
RDW: 13.3 % (ref 11.5–15.5)
WBC: 3.8 10*3/uL — ABNORMAL LOW (ref 4.0–10.5)
nRBC: 0 % (ref 0.0–0.2)

## 2023-05-03 LAB — URINALYSIS, W/ REFLEX TO CULTURE (INFECTION SUSPECTED)
Bacteria, UA: NONE SEEN
Glucose, UA: NEGATIVE mg/dL
Hgb urine dipstick: NEGATIVE
Ketones, ur: 20 mg/dL — AB
Leukocytes,Ua: NEGATIVE
Nitrite: NEGATIVE
Protein, ur: 30 mg/dL — AB
Specific Gravity, Urine: 1.02 (ref 1.005–1.030)
pH: 6 (ref 5.0–8.0)

## 2023-05-03 LAB — COMPREHENSIVE METABOLIC PANEL
ALT: 28 U/L (ref 0–44)
AST: 108 U/L — ABNORMAL HIGH (ref 15–41)
Albumin: 3.9 g/dL (ref 3.5–5.0)
Alkaline Phosphatase: 127 U/L — ABNORMAL HIGH (ref 38–126)
Anion gap: 17 — ABNORMAL HIGH (ref 5–15)
BUN: 15 mg/dL (ref 8–23)
CO2: 22 mmol/L (ref 22–32)
Calcium: 9 mg/dL (ref 8.9–10.3)
Chloride: 93 mmol/L — ABNORMAL LOW (ref 98–111)
Creatinine, Ser: 0.72 mg/dL (ref 0.61–1.24)
GFR, Estimated: >60 mL/min (ref >60)
Glucose, Bld: 79 mg/dL (ref 70–99)
Potassium: 3.2 mmol/L — ABNORMAL LOW (ref 3.5–5.1)
Sodium: 132 mmol/L — ABNORMAL LOW (ref 135–145)
Total Bilirubin: 2.3 mg/dL — ABNORMAL HIGH (ref <1.2)
Total Protein: 8.5 g/dL — ABNORMAL HIGH (ref 6.5–8.1)

## 2023-05-03 LAB — RAPID URINE DRUG SCREEN, HOSP PERFORMED
Amphetamines: NOT DETECTED
Barbiturates: NOT DETECTED
Benzodiazepines: POSITIVE — AB
Cocaine: NOT DETECTED
Opiates: NOT DETECTED
Tetrahydrocannabinol: NOT DETECTED

## 2023-05-03 LAB — TROPONIN I (HIGH SENSITIVITY): Troponin I (High Sensitivity): 3 ng/L (ref <18)

## 2023-05-03 LAB — TYPE AND SCREEN
ABO/RH(D): O POS
Antibody Screen: NEGATIVE

## 2023-05-03 LAB — PROTIME-INR
INR: 1.2 (ref 0.8–1.2)
Prothrombin Time: 15 s (ref 11.4–15.2)

## 2023-05-03 LAB — ETHANOL: Alcohol, Ethyl (B): 221 mg/dL — ABNORMAL HIGH (ref <10)

## 2023-05-03 LAB — CK: Total CK: 46 U/L — ABNORMAL LOW (ref 49–397)

## 2023-05-03 LAB — AMMONIA: Ammonia: 26 umol/L (ref 9–35)

## 2023-05-03 MED ORDER — ACETAMINOPHEN 650 MG RE SUPP: 650.0000 mg | Freq: Four times a day (QID) | RECTAL | Status: DC | PRN

## 2023-05-03 MED ORDER — ADULT MULTIVITAMIN W/MINERALS CH
1.0000 | ORAL_TABLET | Freq: Every day | ORAL | Status: DC
  Administered 2023-05-03 – 2023-05-09 (×7): 1 via ORAL
  Filled 2023-05-03 (×7): qty 1

## 2023-05-03 MED ORDER — FOLIC ACID 1 MG PO TABS
1.0000 mg | ORAL_TABLET | Freq: Every day | ORAL | Status: DC
  Administered 2023-05-03 – 2023-05-09 (×7): 1 mg via ORAL
  Filled 2023-05-03 (×7): qty 1

## 2023-05-03 MED ORDER — POTASSIUM CHLORIDE CRYS ER 20 MEQ PO TBCR
40.0000 meq | EXTENDED_RELEASE_TABLET | Freq: Once | ORAL | Status: AC
  Administered 2023-05-03: 40 meq via ORAL
  Filled 2023-05-03: qty 2

## 2023-05-03 MED ORDER — ACETAMINOPHEN 325 MG PO TABS
650.0000 mg | ORAL_TABLET | Freq: Four times a day (QID) | ORAL | Status: DC | PRN
  Administered 2023-05-04: 650 mg via ORAL
  Filled 2023-05-03: qty 2

## 2023-05-03 MED ORDER — HYDRALAZINE HCL 20 MG/ML IJ SOLN: 5.0000 mg | Freq: Four times a day (QID) | INTRAMUSCULAR | Status: DC | PRN

## 2023-05-03 MED ORDER — LORAZEPAM 1 MG PO TABS
1.0000 mg | ORAL_TABLET | ORAL | Status: AC | PRN
Start: 2023-05-03 — End: 2023-05-06

## 2023-05-03 MED ORDER — THIAMINE MONONITRATE 100 MG PO TABS
100.0000 mg | ORAL_TABLET | Freq: Every day | ORAL | Status: DC
  Administered 2023-05-03 – 2023-05-09 (×7): 100 mg via ORAL
  Filled 2023-05-03 (×7): qty 1

## 2023-05-03 MED ORDER — THIAMINE HCL 100 MG/ML IJ SOLN
100.0000 mg | Freq: Every day | INTRAMUSCULAR | Status: DC
  Filled 2023-05-03: qty 2

## 2023-05-03 MED ORDER — SODIUM CHLORIDE 0.9% IV SOLUTION: Freq: Once | INTRAVENOUS | Status: AC

## 2023-05-03 NOTE — ED Notes (Signed)
Report given to [facility], RN @ [facility]. [facility] contacted for pt's transport.

## 2023-05-03 NOTE — Progress Notes (Signed)
Patient to [phone] at this time

## 2023-05-03 NOTE — ED Notes (Signed)
pt aware of urine specimen needed urinal at bedside

## 2023-05-03 NOTE — Plan of Care (Signed)

## 2023-05-03 NOTE — Progress Notes (Signed)
Called in regards to patients head CT. His subdural has actually improved since the initial injury back in April when it was measured 2cm. It is now 7mm. Do not recommend any more surgical intervention. He has had an MMA by Dr. Conchita Paris.

## 2023-05-03 NOTE — ED Notes (Signed)
Pt reports drinking half a bottle (5th) of whiskey per day on the weekends, today pt reports extending that to more than half of bottle.
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
## 2023-05-03 NOTE — ED Provider Notes (Signed)
  Physical Exam  BP 109/70 (BP Location: Left Arm)   Pulse 79   Temp 98.5 F (36.9 C) (Oral)   Resp 16   SpO2 98%   Physical Exam  Procedures  Procedures  ED Course / MDM    Medical Decision Making Amount and/or Complexity of Data Reviewed Labs: ordered. Radiology: ordered.  Risk Prescription drug management.  Received in signout.  Reported weakness.  Reportedly not getting out of bed.  Chronic alcoholic.  Head CT done and shows potential acute on chronic subdural.  Sizes similar to prior but now has an acute component.  Patient states he is just here because his shoulder hurts but reportedly sent in because he is not getting out of bed.  However does have a worsening thrombocytopenia with platelets of 55.  Will discuss with neurosurgery.  Discussed with neurosurgery, will transfuse platelets.  Will admit to internal medicine at [facility].  CRITICAL CARE Performed by: Benjiman Core Total critical care time: 30 minutes Critical care time was exclusive of separately billable procedures and treating other patients. Critical care was necessary to treat or prevent imminent or life-threatening deterioration. Critical care was time spent personally by me on the following activities: development of treatment plan with patient and/or surrogate as well as nursing, discussions with consultants, evaluation of patient's response to treatment, examination of patient, obtaining history from patient or surrogate, ordering and performing treatments and interventions, ordering and review of laboratory studies, ordering and review of radiographic studies, pulse oximetry and re-evaluation of patient's condition.    Benjiman Core, MD 05/03/23 [phone]

## 2023-05-04 ENCOUNTER — Observation Stay (HOSPITAL_COMMUNITY): Payer: BC Managed Care – PPO

## 2023-05-04 ENCOUNTER — Inpatient Hospital Stay (HOSPITAL_COMMUNITY): Payer: BC Managed Care – PPO

## 2023-05-04 DIAGNOSIS — R296 Repeated falls: Secondary | ICD-10-CM | POA: Diagnosis present

## 2023-05-04 DIAGNOSIS — K760 Fatty (change of) liver, not elsewhere classified: Secondary | ICD-10-CM | POA: Diagnosis present

## 2023-05-04 DIAGNOSIS — R7989 Other specified abnormal findings of blood chemistry: Secondary | ICD-10-CM | POA: Diagnosis present

## 2023-05-04 DIAGNOSIS — Z960 Presence of urogenital implants: Secondary | ICD-10-CM | POA: Diagnosis present

## 2023-05-04 DIAGNOSIS — E222 Syndrome of inappropriate secretion of antidiuretic hormone: Secondary | ICD-10-CM | POA: Diagnosis present

## 2023-05-04 DIAGNOSIS — E860 Dehydration: Secondary | ICD-10-CM | POA: Diagnosis present

## 2023-05-04 DIAGNOSIS — F17210 Nicotine dependence, cigarettes, uncomplicated: Secondary | ICD-10-CM | POA: Diagnosis present

## 2023-05-04 DIAGNOSIS — Z87442 Personal history of urinary calculi: Secondary | ICD-10-CM | POA: Diagnosis not present

## 2023-05-04 DIAGNOSIS — E639 Nutritional deficiency, unspecified: Secondary | ICD-10-CM | POA: Diagnosis present

## 2023-05-04 DIAGNOSIS — Y907 Blood alcohol level of 200-239 mg/100 ml: Secondary | ICD-10-CM | POA: Diagnosis present

## 2023-05-04 DIAGNOSIS — Z8249 Family history of ischemic heart disease and other diseases of the circulatory system: Secondary | ICD-10-CM | POA: Diagnosis not present

## 2023-05-04 DIAGNOSIS — D6959 Other secondary thrombocytopenia: Secondary | ICD-10-CM | POA: Diagnosis present

## 2023-05-04 DIAGNOSIS — M25511 Pain in right shoulder: Secondary | ICD-10-CM | POA: Diagnosis present

## 2023-05-04 DIAGNOSIS — Z83438 Family history of other disorder of lipoprotein metabolism and other lipidemia: Secondary | ICD-10-CM | POA: Diagnosis not present

## 2023-05-04 DIAGNOSIS — I10 Essential (primary) hypertension: Secondary | ICD-10-CM | POA: Diagnosis present

## 2023-05-04 DIAGNOSIS — K7030 Alcoholic cirrhosis of liver without ascites: Secondary | ICD-10-CM | POA: Diagnosis present

## 2023-05-04 DIAGNOSIS — S065XAA Traumatic subdural hemorrhage with loss of consciousness status unknown, initial encounter: Secondary | ICD-10-CM | POA: Diagnosis present

## 2023-05-04 DIAGNOSIS — I6203 Nontraumatic chronic subdural hemorrhage: Secondary | ICD-10-CM | POA: Diagnosis not present

## 2023-05-04 DIAGNOSIS — D61818 Other pancytopenia: Secondary | ICD-10-CM | POA: Diagnosis present

## 2023-05-04 DIAGNOSIS — Z808 Family history of malignant neoplasm of other organs or systems: Secondary | ICD-10-CM | POA: Diagnosis not present

## 2023-05-04 DIAGNOSIS — Z807 Family history of other malignant neoplasms of lymphoid, hematopoietic and related tissues: Secondary | ICD-10-CM | POA: Diagnosis not present

## 2023-05-04 DIAGNOSIS — Z91199 Patient's noncompliance with other medical treatment and regimen due to unspecified reason: Secondary | ICD-10-CM | POA: Diagnosis not present

## 2023-05-04 DIAGNOSIS — F1010 Alcohol abuse, uncomplicated: Secondary | ICD-10-CM | POA: Diagnosis present

## 2023-05-04 DIAGNOSIS — E876 Hypokalemia: Secondary | ICD-10-CM | POA: Diagnosis present

## 2023-05-04 LAB — IRON AND TIBC
Iron: 109 ug/dL (ref 45–182)
Saturation Ratios: 40 % — ABNORMAL HIGH (ref 17.9–39.5)
TIBC: 274 ug/dL (ref 250–450)
UIBC: 165 ug/dL

## 2023-05-04 LAB — CBC
HCT: 27.2 % — ABNORMAL LOW (ref 39.0–52.0)
Hemoglobin: 9.7 g/dL — ABNORMAL LOW (ref 13.0–17.0)
MCH: 34.5 pg — ABNORMAL HIGH (ref 26.0–34.0)
MCHC: 35.7 g/dL (ref 30.0–36.0)
MCV: 96.8 fL (ref 80.0–100.0)
Platelets: 46 10*3/uL — ABNORMAL LOW (ref 150–400)
RBC: 2.81 MIL/uL — ABNORMAL LOW (ref 4.22–5.81)
RDW: 13.4 % (ref 11.5–15.5)
WBC: 2.3 10*3/uL — ABNORMAL LOW (ref 4.0–10.5)
nRBC: 0 % (ref 0.0–0.2)

## 2023-05-04 LAB — COMPREHENSIVE METABOLIC PANEL
ALT: 26 U/L (ref 0–44)
AST: 117 U/L — ABNORMAL HIGH (ref 15–41)
Albumin: 3.2 g/dL — ABNORMAL LOW (ref 3.5–5.0)
Alkaline Phosphatase: 119 U/L (ref 38–126)
Anion gap: 10 (ref 5–15)
BUN: 9 mg/dL (ref 8–23)
CO2: 25 mmol/L (ref 22–32)
Calcium: 9.2 mg/dL (ref 8.9–10.3)
Chloride: 96 mmol/L — ABNORMAL LOW (ref 98–111)
Creatinine, Ser: 0.66 mg/dL (ref 0.61–1.24)
GFR, Estimated: >60 mL/min (ref >60)
Glucose, Bld: 99 mg/dL (ref 70–99)
Potassium: 2.9 mmol/L — ABNORMAL LOW (ref 3.5–5.1)
Sodium: 131 mmol/L — ABNORMAL LOW (ref 135–145)
Total Bilirubin: 2.5 mg/dL — ABNORMAL HIGH (ref <1.2)
Total Protein: 7.2 g/dL (ref 6.5–8.1)

## 2023-05-04 LAB — BPAM PLATELET PHERESIS
Blood Product Expiration Date: 202411232359
ISSUE DATE / TIME: 202411201125
Unit Type and Rh: 600

## 2023-05-04 LAB — TECHNOLOGIST SMEAR REVIEW

## 2023-05-04 LAB — FERRITIN: Ferritin: 570 ng/mL — ABNORMAL HIGH (ref 24–336)

## 2023-05-04 LAB — RETICULOCYTES
Immature Retic Fract: 2.4 % (ref 2.3–15.9)
RBC.: 2.83 MIL/uL — ABNORMAL LOW (ref 4.22–5.81)
Retic Count, Absolute: 11.8 10*3/uL — ABNORMAL LOW (ref 19.0–186.0)
Retic Ct Pct: 0.4 % (ref 0.4–3.1)

## 2023-05-04 LAB — VITAMIN B12: Vitamin B-12: 630 pg/mL (ref 180–914)

## 2023-05-04 LAB — PREPARE PLATELET PHERESIS: Unit division: 0

## 2023-05-04 LAB — MAGNESIUM: Magnesium: 1.1 mg/dL — ABNORMAL LOW (ref 1.7–2.4)

## 2023-05-04 LAB — FOLATE: Folate: 15.7 ng/mL (ref >5.9)

## 2023-05-04 MED ORDER — SODIUM CHLORIDE 0.9% IV SOLUTION: Freq: Once | INTRAVENOUS | Status: AC

## 2023-05-04 MED ORDER — MAGNESIUM SULFATE 2 GM/50ML IV SOLN
2.0000 g | Freq: Once | INTRAVENOUS | Status: AC
  Administered 2023-05-04: 2 g via INTRAVENOUS
  Filled 2023-05-04: qty 50

## 2023-05-04 MED ORDER — POTASSIUM CHLORIDE CRYS ER 20 MEQ PO TBCR
40.0000 meq | EXTENDED_RELEASE_TABLET | Freq: Once | ORAL | Status: AC
  Administered 2023-05-04: 40 meq via ORAL
  Filled 2023-05-04: qty 2

## 2023-05-04 MED ORDER — SODIUM CHLORIDE 0.9 % IV SOLN
INTRAVENOUS | Status: AC
  Filled 2023-05-04 (×2): qty 1000

## 2023-05-04 MED ORDER — CHLORDIAZEPOXIDE HCL 5 MG PO CAPS
10.0000 mg | ORAL_CAPSULE | Freq: Three times a day (TID) | ORAL | Status: DC
  Administered 2023-05-04 – 2023-05-09 (×16): 10 mg via ORAL
  Filled 2023-05-04 (×16): qty 2

## 2023-05-04 NOTE — Evaluation (Signed)
Clinical/Bedside Swallow Evaluation Patient Details  Name: Richard Andrews MRN: [id number] Date of Birth: 1961/02/29  Today's Date: 05/04/2023 Time: SLP Start Time (ACUTE ONLY): 0850 SLP Stop Time (ACUTE ONLY): 0902 SLP Time Calculation (min) (ACUTE ONLY): 12 min  Past Medical History:  Past Medical History:  Diagnosis Date   Allergy    Arthritis    Distal radius fracture, left    Hepatic steatosis    Hypertension    Kidney stone 02/16/2023   Substance abuse (HCC)    Past Surgical History:  Past Surgical History:  Procedure Laterality Date   BIOPSY  01/22/2021   Procedure: BIOPSY;  Surgeon: Shellia Cleverly, DO;  Location: [facility];  Service: Gastroenterology;;   CYSTOSCOPY W/ URETERAL STENT PLACEMENT Bilateral 01/23/2023   Procedure: 1. Cystoscopy 2. Bilateral  retrograde pyelogram with interpretation 3. bilateral ureteral stent placement (6x24 cm on the right, 6x26 cm on the left) 4. Fluoroscopy <1 hour with intraoperative interpretation;  Surgeon: Despina Arias, MD;  Location: [facility] OR;  Service: Urology;  Laterality: Bilateral;   CYSTOSCOPY/URETEROSCOPY/HOLMIUM LASER/STENT PLACEMENT Bilateral 03/21/2023   Procedure: BILATERAL URETEROSCOPY/HOLMIUM LASER/STENT PLACEMENT;  Surgeon: Despina Arias, MD;  Location: [facility];  Service: Urology;  Laterality: Bilateral;   ESOPHAGOGASTRODUODENOSCOPY (EGD) WITH PROPOFOL N/A 01/22/2021   Procedure: ESOPHAGOGASTRODUODENOSCOPY (EGD) WITH PROPOFOL;  Surgeon: Shellia Cleverly, DO;  Location: [facility];  Service: Gastroenterology;  Laterality: N/A;   I & D EXTREMITY Right 01/10/2020   Procedure: ORIF RIGHT WRIST;  Surgeon: Dominica Severin, MD;  Location: [facility] OR;  Service: Orthopedics;  Laterality: Right;   IR ANGIO EXTERNAL CAROTID SEL EXT CAROTID UNI R MOD SED  10/04/2022   IR ANGIO INTRA EXTRACRAN SEL INTERNAL CAROTID UNI R MOD SED  09/30/2022   IR ANGIOGRAM FOLLOW UP STUDY  09/30/2022   IR NEURO EACH ADD'L AFTER BASIC UNI RIGHT (MS)   10/04/2022   IR TRANSCATH/EMBOLIZ  09/30/2022   OPEN REDUCTION INTERNAL FIXATION (ORIF) DISTAL RADIAL FRACTURE Left 01/28/2015   Procedure: OPEN REDUCTION INTERNAL FIXATION (ORIF) LEFT DISTAL RADIAL FRACTURE;  Surgeon: Tarry Kos, MD;  Location: [facility];  Service: Orthopedics;  Laterality: Left;   ORIF HUMERUS FRACTURE Right 09/27/2022   Procedure: OPEN REDUCTION INTERNAL FIXATION (ORIF) PROXIMAL HUMERUS FRACTURE;  Surgeon: Teryl Lucy, MD;  Location: [facility] OR;  Service: Orthopedics;  Laterality: Right;   RADIOLOGY WITH ANESTHESIA N/A 09/30/2022   Procedure: IR WITH ANESTHESIA MMA EMBOLIZATION;  Surgeon: Radiologist, Medication, MD;  Location: [facility] OR;  Service: Radiology;  Laterality: N/A;   TONSILLECTOMY     HPI:  Richard Andrews is a 62 y.o. male with medical history significant for chronic alcohol abuse, hypertension, hepatic steatosis, traumatic subdural hemorrhage August 2024 being admitted to the hospital with concern for acute on chronic subdural hematoma.  Patient says he lives with his brother-in-law who takes care of his needs such as shopping and cooking.  Mostly he spends his time on the couch. His brother-in-law called EMS as he apparently has not gotten out of bed for the past 5 days.  Patient denies any new symptoms, drinks liquor daily.  States that he does not get out of bed very much, because he is unsteady on his feet and his right shoulder has been hurting him.  He denies any recent cough, shortness of breath, fevers, chills, nausea or discomfort anywhere other than his right shoulder.  Evaluation in the ED is significant for worsening thrombocytopenia when compared to most recent labs.  CT scan of the head also shows evidence, as detailed below, of acute on chronic subdural hemorrhage.  ER provider discussed with neurosurgery who recommends observation admission to [name] on the hospitalist service.    Assessment / Plan / Recommendation  Clinical Impression   Pt  presents with a functional oropharyngeal swallow per clinical swallow assessment completed today. Oral prep and transit mildly prolonged, likely secondary to missing dentition, though pt able to clear boluses with additional time. Pharyngeal swallow initiation appeared timely with laryngeal elevation noted. No overt or subtle s/s of aspiration noted across consistencies.   Recommend continue current diet at tolerated and PO meds as tolerated. No acute SLP needs identified at this time. SLP will sign off. Please re-consult if pt exhibits concerns for aspiration with PO intake.   SLP Visit Diagnosis:  (r/o dysphagia)    Aspiration Risk  No limitations    Diet Recommendation Regular;Thin liquid    Liquid Administration via: Cup;Straw Medication Administration: Whole meds with liquid Supervision: Patient able to self feed (may need assistance cutting up food) Compensations: Slow rate;Small sips/bites    Other  Recommendations Oral Care Recommendations: Oral care BID    Recommendations for follow up therapy are one component of a multi-disciplinary discharge planning process, led by the attending physician.  Recommendations may be updated based on patient status, additional functional criteria and insurance authorization.  Follow up Recommendations No SLP follow up      Assistance Recommended at Discharge  None from SLP standpoint  Functional Status Assessment Patient has not had a recent decline in their functional status  Frequency and Duration   N/a; d/c acute SLP         Prognosis Prognosis for improved oropharyngeal function:  (no SLP intervention needed)      Swallow Study   General Date of Onset: 05/03/23 HPI: Richard Andrews is a 62 y.o. male with medical history significant for chronic alcohol abuse, hypertension, hepatic steatosis, traumatic subdural hemorrhage August 2024 being admitted to the hospital with concern for acute on chronic subdural hematoma.  Patient says he  lives with his brother-in-law who takes care of his needs such as shopping and cooking.  Mostly he spends his time on the couch. His brother-in-law called EMS as he apparently has not gotten out of bed for the past 5 days.  Patient denies any new symptoms, drinks liquor daily.  States that he does not get out of bed very much, because he is unsteady on his feet and his right shoulder has been hurting him.  He denies any recent cough, shortness of breath, fevers, chills, nausea or discomfort anywhere other than his right shoulder.  Evaluation in the ED is significant for worsening thrombocytopenia when compared to most recent labs.  CT scan of the head also shows evidence, as detailed below, of acute on chronic subdural hemorrhage.  ER provider discussed with neurosurgery who recommends observation admission to [name] on the hospitalist service. Type of Study: Bedside Swallow Evaluation Previous Swallow Assessment: none per chart; pt previously followed by SLP for cognition Diet Prior to this Study: Regular;Thin liquids (Level 0) Temperature Spikes Noted: No Respiratory Status: Room air History of Recent Intubation: No Behavior/Cognition: Alert;Cooperative;Pleasant mood Oral Cavity Assessment: Within Functional Limits Oral Care Completed by SLP: No Oral Cavity - Dentition: Poor condition;Missing dentition Vision: Functional for self-feeding Self-Feeding Abilities: Able to feed self Patient Positioning: Upright in bed Baseline Vocal Quality: Normal Volitional Swallow: Able to elicit    Oral/Motor/Sensory  Function Overall Oral Motor/Sensory Function: Within functional limits   Ice Chips Ice chips: Not tested   Thin Liquid Thin Liquid: Within functional limits Presentation: Self Fed;Straw    Nectar Thick Nectar Thick Liquid: Not tested   Honey Thick Honey Thick Liquid: Not tested   Puree Puree: Within functional limits Presentation: Self Fed   Solid     Solid: Within functional  limits Presentation: Self Fed      Ellery Plunk 05/04/2023,9:14 AM

## 2023-05-04 NOTE — Evaluation (Signed)
Physical Therapy Evaluation Patient Details Name: Richard Richard Andrews MRN: [id number] DOB: Jan 22, 1961 Today's Date: 05/04/2023  History of Present Illness  62 y.o. male with medical history significant for chronic alcohol abuse, hypertension, hepatic steatosis, traumatic subdural hemorrhage August 2024, he lives at home with his brother-in-law and constantly drinks alcohol, brother-in-law noted that patient was not as active for the last 5 days he was brought to [facility] ER where CT of the head showed acute on chronic subdural hematoma he was transferred to [facility] for further care.  Clinical Impression  Pt presents with admitting diagnosis above. Pt today was able to sit EOB with supervision however required Max A to stand twice and was unable to pivot to chair. Pt reports PTA that he mostly uses a WC around the house however can walk some with RW. Patient will benefit from continued inpatient follow up therapy, <3 hours/day. PT will continue to follow.         If plan is discharge home, recommend the following: Two people to help with walking and/or transfers;A lot of help with bathing/dressing/bathroom;Assistance with cooking/housework;Direct supervision/assist for medications management;Assist for transportation;Help with stairs or ramp for entrance   Can travel by private vehicle   No    Equipment Recommendations Other (comment) (Per accepting facility)  Recommendations for Other Services       Functional Status Assessment Patient has had a recent decline in their functional status and demonstrates the ability to make significant improvements in function in a reasonable and predictable amount of time.     Precautions / Restrictions Precautions Precautions: Fall Restrictions Weight Bearing Restrictions: No      Mobility  Bed Mobility Overal bed mobility: Needs Assistance Bed Mobility: Supine to Sit     Supine to sit: Supervision, HOB elevated     General bed mobility  comments: Pt able to sit EOB with no physical assist. Left seated EOB to eat    Transfers Overall transfer level: Needs assistance Equipment used: 1 person hand held assist Transfers: Sit to/from Stand Sit to Stand: Max assist           General transfer comment: Able to stand twice with Max A however pt unable to take steps. Opted for 1 person HHA instead of RW due to R shoulder pain    Ambulation/Gait               General Gait Details: Unable  Stairs            Wheelchair Mobility     Tilt Bed    Modified Rankin (Stroke Patients Only)       Balance Overall balance assessment: Needs assistance Sitting-balance support: Bilateral upper extremity supported, Feet supported Sitting balance-Leahy Scale: Good Sitting balance - Comments: EOB                                     Pertinent Vitals/Pain Pain Assessment Pain Assessment: 0-10 Pain Score: 5  Pain Location: R shoulder Pain Descriptors / Indicators: Constant, Discomfort, Grimacing Pain Intervention(s): Monitored during session, Limited activity within patient's tolerance, Patient requesting pain meds-RN notified    Home Living Family/patient expects to be discharged to:: Private residence Living Arrangements: Other relatives (Brother in Social worker) Available Help at Discharge: Family;Available 24 hours/day Type of Home: House Home Access: Ramped entrance;Stairs to enter   Entrance Stairs-Number of Steps: Uses ramped entrance Alternate Level Stairs-Number of Steps: approximately  14 Home Layout: Two level;Able to live on main level with bedroom/bathroom Home Equipment: Rolling Walker (2 wheels);Wheelchair - manual Additional Comments: Pt states he lives with his brother in law, uses w/c for mobility around home, help with IADLs    Prior Function Prior Level of Function : Needs assist             Mobility Comments: Pt states he uses w/c around home, assist for bed mobility at  times ADLs Comments: pt states he has tried ADLs in [facility].     Extremity/Trunk Assessment   Upper Extremity Assessment Upper Extremity Assessment: Defer to OT evaluation    Lower Extremity Assessment Lower Extremity Assessment: Generalized weakness    Cervical / Trunk Assessment Cervical / Trunk Assessment: Kyphotic  Communication   Communication Communication: No apparent difficulties Cueing Techniques: Verbal cues  Cognition Arousal: Alert Behavior During Therapy: WFL for tasks assessed/performed, Flat affect Overall Cognitive Status: Within Functional Limits for tasks assessed                                 General Comments: Pt pleasant with somewhat of a falt affect.        General Comments General comments (skin integrity, edema, etc.): VSS    Exercises     Assessment/Plan    PT Assessment Patient needs continued PT services  PT Problem List Decreased strength;Decreased range of motion;Decreased activity tolerance;Decreased balance;Decreased mobility;Decreased coordination;Decreased knowledge of use of DME;Decreased safety awareness;Decreased knowledge of precautions;Cardiopulmonary status limiting activity;Pain       PT Treatment Interventions DME instruction;Gait training;Functional mobility training;Stair training;Therapeutic exercise;Therapeutic activities;Balance training;Neuromuscular re-education;Patient/family education    PT Goals (Current goals can be found in the Care Plan section)  Acute Rehab PT Goals Patient Stated Goal: to get better PT Goal Formulation: With patient Time For Goal Achievement: 05/18/23 Potential to Achieve Goals: Fair    Frequency Min 1X/week     Co-evaluation               AM-PAC PT "6 Clicks" Mobility  Outcome Measure Help needed turning from your back to your side while in a flat bed without using bedrails?: A Little Help needed moving from lying on your back to sitting on the side of a flat bed  without using bedrails?: A Little Help needed moving to and from a bed to a chair (including a wheelchair)?: Total Help needed standing up from a chair using your arms (e.g., wheelchair or bedside chair)?: A Lot Help needed to walk in [facility] room?: Total Help needed climbing 3-5 steps with a railing? : Total 6 Click Score: 11    End of Session Equipment Utilized During Treatment: Gait belt Activity Tolerance: Patient tolerated treatment well;Patient limited by fatigue Patient left: in bed;with call bell/phone within reach;with bed alarm set Nurse Communication: Mobility status;Need for lift equipment PT Visit Diagnosis: Other abnormalities of gait and mobility (R26.89)    Time: 6301-6010 PT Time Calculation (min) (ACUTE ONLY): 33 min   Charges:   PT Evaluation $PT Eval Moderate Complexity: 1 Mod PT Treatments $Therapeutic Activity: 8-22 mins PT General Charges $$ ACUTE PT VISIT: 1 Visit         Shela Nevin, PT, DPT Acute Rehab Services [phone]   Gladys Damme 05/04/2023, 3:56 PM

## 2023-05-04 NOTE — Progress Notes (Signed)
OT Cancellation Note  Patient Details Name: Richard Andrews MRN: [id number] DOB: 1960/12/16   Cancelled Treatment:    Reason Eval/Treat Not Completed: Patient not medically ready [name] to enter pt room with RN at bedside. RN reports pt pulled out his IV and is bleeding. OT to follow-up with pt as able.)  05/04/2023  AB, OTR/L  Acute Rehabilitation Services  Office: [phone]   Tristan Schroeder 05/04/2023, 9:20 AM

## 2023-05-04 NOTE — Progress Notes (Signed)
PT Cancellation Note  Patient Details Name: Richard Andrews MRN: [id number] DOB: 1961/01/26   Cancelled Treatment:    Reason Eval/Treat Not Completed: Patient at procedure or test/unavailable (Pt getting ultrasound. Will follow up if time allows.)   Gladys Damme 05/04/2023, 2:43 PM

## 2023-05-04 NOTE — Plan of Care (Signed)
Patient not improving no changes

## 2023-05-04 NOTE — Progress Notes (Signed)
Patient back from ct and he pulled his iv in lower right arm out.

## 2023-05-04 NOTE — Progress Notes (Signed)
Patient to stat head ct with Horris Latino RN. Patient is a/o x4.

## 2023-05-04 NOTE — Plan of Care (Signed)
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
## 2023-05-04 NOTE — Evaluation (Signed)
Occupational Therapy Evaluation Patient Details Name: Richard Andrews MRN: [id number] DOB: 1960/12/21 Today's Date: 05/04/2023   History of Present Illness 62 y.o. male with medical history significant for chronic alcohol abuse, hypertension, hepatic steatosis, traumatic subdural hemorrhage August 2024, he lives at home with his brother-in-law and constantly drinks alcohol, brother-in-law noted that patient was not as active for the last 5 days he was brought to [facility], ER where CT of the head showed acute on chronic subdural hematoma he was transferred to [facility] for further care.   Clinical Impression   Pt admitted for above, pt  needing significant assist for OOB mobility due to a strong fear of falling causing the pt to lock into extension and push. Worked on building standing confidence and using cues to facilitate relaxation and better posture, BLE MMT was good and proved that pt legs were quite strong. Pt is able to complete seated ADLs with CGA, recommend staying by his side due to strong fear of falling. Pt would benefit from continued acute skilled OT services to address deficits and help transition to next level of care. Patient would benefit from post acute skilled rehab facility with <3 hours of therapy and 24/7 support.       If plan is discharge home, recommend the following: A lot of help with walking and/or transfers;Two people to help with walking and/or transfers;Assistance with cooking/housework    Functional Status Assessment  Patient has had a recent decline in their functional status and demonstrates the ability to make significant improvements in function in a reasonable and predictable amount of time.  Equipment Recommendations  None recommended by OT (defer)    Recommendations for Other Services       Precautions / Restrictions Precautions Precautions: Fall Precaution Comments: Fear of Falling Restrictions Weight Bearing Restrictions: No      Mobility  Bed Mobility Overal bed mobility: Needs Assistance Bed Mobility: Supine to Sit, Sit to Supine, Rolling Rolling: Min assist, Used rails   Supine to sit: Contact guard, HOB elevated, Used rails Sit to supine: Contact guard assist, HOB elevated, Used rails   General bed mobility comments: Min A to fully roll on side with cleanup, increased time needed for scooting    Transfers Overall transfer level: Needs assistance Equipment used: Rolling walker (2 wheels), 1 person hand held assist Transfers: Sit to/from Stand Sit to Stand: Max assist           General transfer comment: STSx2 Max A but not steps, deferred transfer to chair. Strong FOF forcing pt to extend body and push away. With proper cues and assist to failitate hips forward was able to achieve full stand      Balance Overall balance assessment: Needs assistance Sitting-balance support: Bilateral upper extremity supported, Feet supported Sitting balance-Leahy Scale: Good Sitting balance - Comments: EOB     Standing balance-Leahy Scale: Zero                             ADL either performed or assessed with clinical judgement   ADL Overall ADL's : Needs assistance/impaired Eating/Feeding: Independent;Bed level   Grooming: Sitting;Contact guard assist   Upper Body Bathing: Sitting;Contact guard assist   Lower Body Bathing: Sitting/lateral leans;Contact guard assist   Upper Body Dressing : Sitting;Contact guard assist   Lower Body Dressing: Sitting/lateral leans;Contact guard assist Lower Body Dressing Details (indicate cue type and reason): doffed/donned socks using figure four Toilet Transfer: +2  for physical assistance;+2 for safety/equipment;Maximal assistance Toilet Transfer Details (indicate cue type and reason): based on assist needed for STS Toileting- Clothing Manipulation and Hygiene: Maximal assistance;+2 for physical assistance         General ADL Comments: Educated pt to breath  thorughout process, making efforts to help pt relax     Vision         Perception         Praxis         Pertinent Vitals/Pain Pain Assessment Pain Assessment: Faces Faces Pain Scale: Hurts little more Pain Location: R shoulder Pain Descriptors / Indicators: Constant, Discomfort, Grimacing, Aching Pain Intervention(s): Monitored during session, Limited activity within patient's tolerance, Repositioned     Extremity/Trunk Assessment Upper Extremity Assessment Upper Extremity Assessment: Generalized weakness;RUE deficits/detail RUE Deficits / Details: Prior R shoulder injury, ~70* shoulder AROM, able to put hand on RW   Lower Extremity Assessment Lower Extremity Assessment:  (BLEs overall 4/5, R may be slightly stronger.)   Cervical / Trunk Assessment Cervical / Trunk Assessment: Kyphotic   Communication Communication Communication: No apparent difficulties Cueing Techniques: Verbal cues;Gestural cues   Cognition Arousal: Alert Behavior During Therapy: WFL for tasks assessed/performed, Flat affect Overall Cognitive Status: Within Functional Limits for tasks assessed                                 General Comments: Pt exhibiting strong fear of falling, locks his body in ext and becomes stiff. A&Ox3, pt did not correctly identify why he is here this visit, info he provided was on last hospital admit     General Comments  VSS    Exercises     Shoulder Instructions      Home Living Family/patient expects to be discharged to:: Private residence Living Arrangements: Other relatives (brother in law) Available Help at Discharge: Family;Available 24 hours/day Type of Home: House Home Access: Ramped entrance;Stairs to enter Entrance Stairs-Number of Steps: Uses ramped entrance   Home Layout: Two level;Able to live on main level with bedroom/bathroom Alternate Level Stairs-Number of Steps: approximately 14 Alternate Level Stairs-Rails: Right;Left;Can  reach both Bathroom Shower/Tub: Chief Strategy Officer: Standard Bathroom Accessibility: Yes   Home Equipment: Agricultural consultant (2 wheels);Wheelchair - manual   Additional Comments: Pt states he lives with his brother in law, uses w/c for mobility around home, help with IADLs. Pt reports he has been working for [employer] recently PTA?? Pt seems to be a bit questionable in cognition, denies any falls      Prior Functioning/Environment Prior Level of Function : Needs assist             Mobility Comments: Pt states he uses w/c around home, assist for bed mobility at times ADLs Comments: Ind with ADLs        OT Problem List: Decreased strength;Impaired balance (sitting and/or standing);Pain      OT Treatment/Interventions: Self-care/ADL training;Balance training;Therapeutic exercise;Therapeutic activities;Patient/family education    OT Goals(Current goals can be found in the care plan section) Acute Rehab OT Goals Patient Stated Goal: To get legs stronger OT Goal Formulation: With patient Time For Goal Achievement: 05/18/23 Potential to Achieve Goals: Good ADL Goals Pt Will Perform Grooming: sitting;with set-up Pt Will Perform Lower Body Bathing: with set-up;with supervision;sitting/lateral leans Pt Will Perform Lower Body Dressing: sit to/from stand;with contact guard assist Pt Will Transfer to Toilet: with min assist;stand pivot transfer;bedside commode Pt  Will Perform Toileting - Clothing Manipulation and hygiene: sitting/lateral leans;with supervision  OT Frequency: Min 1X/week    Co-evaluation              AM-PAC OT "6 Clicks" Daily Activity     Outcome Measure Help from another person eating meals?: None Help from another person taking care of personal grooming?: A Little Help from another person toileting, which includes using toliet, bedpan, or urinal?: A Lot Help from another person bathing (including washing, rinsing, drying)?: A Little Help  from another person to put on and taking off regular upper body clothing?: A Little Help from another person to put on and taking off regular lower body clothing?: A Little 6 Click Score: 18   End of Session Equipment Utilized During Treatment: Gait belt;Rolling walker (2 wheels) Nurse Communication: Mobility status  Activity Tolerance: Patient tolerated treatment well Patient left: in bed;with call bell/phone within reach;with bed alarm set  OT Visit Diagnosis: Unsteadiness on feet (R26.81);Other abnormalities of gait and mobility (R26.89);Muscle weakness (generalized) (M62.81);Pain Pain - Right/Left: Right Pain - part of body: Shoulder                Time: 2956-2130 OT Time Calculation (min): 26 min Charges:  OT General Charges $OT Visit: 1 Visit OT Evaluation $OT Eval Moderate Complexity: 1 Mod OT Treatments $Therapeutic Activity: 8-22 mins  05/04/2023  AB, OTR/L  Acute Rehabilitation Services  Office: [phone]   Tristan Schroeder 05/04/2023, 5:57 PM

## 2023-05-05 DIAGNOSIS — I6203 Nontraumatic chronic subdural hemorrhage: Secondary | ICD-10-CM | POA: Diagnosis not present

## 2023-05-05 LAB — URIC ACID: Uric Acid, Serum: 2.8 mg/dL — ABNORMAL LOW (ref 3.7–8.6)

## 2023-05-05 LAB — COMPREHENSIVE METABOLIC PANEL
ALT: 29 U/L (ref 0–44)
AST: 135 U/L — ABNORMAL HIGH (ref 15–41)
Albumin: 3.1 g/dL — ABNORMAL LOW (ref 3.5–5.0)
Alkaline Phosphatase: 115 U/L (ref 38–126)
Anion gap: 7 (ref 5–15)
BUN: 6 mg/dL — ABNORMAL LOW (ref 8–23)
CO2: 23 mmol/L (ref 22–32)
Calcium: 9 mg/dL (ref 8.9–10.3)
Chloride: 101 mmol/L (ref 98–111)
Creatinine, Ser: 0.74 mg/dL (ref 0.61–1.24)
GFR, Estimated: >60 mL/min (ref >60)
Glucose, Bld: 102 mg/dL — ABNORMAL HIGH (ref 70–99)
Potassium: 3.9 mmol/L (ref 3.5–5.1)
Sodium: 131 mmol/L — ABNORMAL LOW (ref 135–145)
Total Bilirubin: 1.8 mg/dL — ABNORMAL HIGH (ref <1.2)
Total Protein: 7.3 g/dL (ref 6.5–8.1)

## 2023-05-05 LAB — PREPARE PLATELET PHERESIS
Unit division: 0
Unit division: 0

## 2023-05-05 LAB — MAGNESIUM: Magnesium: 1.6 mg/dL — ABNORMAL LOW (ref 1.7–2.4)

## 2023-05-05 LAB — CBC WITH DIFFERENTIAL/PLATELET
Abs Immature Granulocytes: 0.01 10*3/uL (ref 0.00–0.07)
Basophils Absolute: 0 10*3/uL (ref 0.0–0.1)
Basophils Relative: 0 %
Eosinophils Absolute: 0.1 10*3/uL (ref 0.0–0.5)
Eosinophils Relative: 4 %
HCT: 27.9 % — ABNORMAL LOW (ref 39.0–52.0)
Hemoglobin: 9.7 g/dL — ABNORMAL LOW (ref 13.0–17.0)
Immature Granulocytes: 0 %
Lymphocytes Relative: 27 %
Lymphs Abs: 0.7 10*3/uL (ref 0.7–4.0)
MCH: 34.4 pg — ABNORMAL HIGH (ref 26.0–34.0)
MCHC: 34.8 g/dL (ref 30.0–36.0)
MCV: 98.9 fL (ref 80.0–100.0)
Monocytes Absolute: 0.4 10*3/uL (ref 0.1–1.0)
Monocytes Relative: 16 %
Neutro Abs: 1.4 10*3/uL — ABNORMAL LOW (ref 1.7–7.7)
Neutrophils Relative %: 53 %
Platelets: 71 10*3/uL — ABNORMAL LOW (ref 150–400)
RBC: 2.82 MIL/uL — ABNORMAL LOW (ref 4.22–5.81)
RDW: 13.3 % (ref 11.5–15.5)
WBC: 2.7 10*3/uL — ABNORMAL LOW (ref 4.0–10.5)
nRBC: 0 % (ref 0.0–0.2)

## 2023-05-05 LAB — SODIUM, URINE, RANDOM: Sodium, Ur: 93 mmol/L

## 2023-05-05 LAB — CREATININE, URINE, RANDOM: Creatinine, Urine: 19 mg/dL

## 2023-05-05 LAB — BPAM PLATELET PHERESIS
Blood Product Expiration Date: 202411222359
Blood Product Expiration Date: 202411242359
ISSUE DATE / TIME: 202411210626
ISSUE DATE / TIME: 202411210954
Unit Type and Rh: 5100
Unit Type and Rh: 600

## 2023-05-05 LAB — OSMOLALITY: Osmolality: 276 mosm/kg (ref 275–295)

## 2023-05-05 LAB — PHOSPHORUS: Phosphorus: 2 mg/dL — ABNORMAL LOW (ref 2.5–4.6)

## 2023-05-05 LAB — OSMOLALITY, URINE: Osmolality, Ur: 254 mosm/kg — ABNORMAL LOW (ref 300–900)

## 2023-05-05 LAB — BRAIN NATRIURETIC PEPTIDE: B Natriuretic Peptide: 67.8 pg/mL (ref 0.0–100.0)

## 2023-05-05 MED ORDER — POTASSIUM PHOSPHATES 15 MMOLE/5ML IV SOLN
30.0000 mmol | Freq: Once | INTRAVENOUS | Status: AC
  Administered 2023-05-05: 30 mmol via INTRAVENOUS
  Filled 2023-05-05: qty 10

## 2023-05-05 MED ORDER — MAGNESIUM SULFATE 4 GM/100ML IV SOLN
4.0000 g | Freq: Once | INTRAVENOUS | Status: AC
  Administered 2023-05-05: 4 g via INTRAVENOUS
  Filled 2023-05-05: qty 100

## 2023-05-05 NOTE — Progress Notes (Signed)
Physical Therapy Treatment Patient Details Name: Richard Andrews MRN: [id number] DOB: 1960/08/16 Today's Date: 05/05/2023   History of Present Illness 62 y.o. male with medical history significant for chronic alcohol abuse, hypertension, hepatic steatosis, traumatic subdural hemorrhage August 2024, he lives at home with his brother-in-law and constantly drinks alcohol, brother-in-law noted that patient was not as active for the last 5 days he was brought to [facility], ER where CT of the head showed acute on chronic subdural hematoma he was transferred to [facility] for further care.    PT Comments  Pt received in supine and agreeable to session. Pt demonstrates good progress towards functional mobility goals this session. Pt requires mod A to stand due to posterior bias with slight improvement with dense cues. Pt able to progress gait distance this session with up to minA. Pt demonstrates improved anterior weight shift during ambulation with no LOB. Pt demonstrates some L drifting and increased difficulty navigating obstacles on the L requiring assist and cues. Pt continues to benefit from PT services to progress toward functional mobility goals.    If plan is discharge home, recommend the following: Two people to help with walking and/or transfers;A lot of help with bathing/dressing/bathroom;Assistance with cooking/housework;Direct supervision/assist for medications management;Assist for transportation;Help with stairs or ramp for entrance   Can travel by private vehicle     No  Equipment Recommendations  Other (comment) (per accepting facility)    Recommendations for Other Services       Precautions / Restrictions Precautions Precautions: Fall Precaution Comments: Fear of Falling Restrictions Weight Bearing Restrictions: No     Mobility  Bed Mobility Overal bed mobility: Needs Assistance Bed Mobility: Supine to Sit     Supine to sit: Used rails, Min assist     General bed  mobility comments: increased time and min A with bedpad to scoot forward to EOB    Transfers Overall transfer level: Needs assistance Equipment used: Rolling walker (2 wheels) Transfers: Sit to/from Stand Sit to Stand: Mod assist           General transfer comment: STS from EOB and recliner with mod A for anterior weight shift. Pt able to slightly improve anterior lean with cues, however continues to push backwards with LEs    Ambulation/Gait Ambulation/Gait assistance: Contact guard assist, Min assist Gait Distance (Feet): 60 Feet Assistive device: Rolling walker (2 wheels) Gait Pattern/deviations: Step-through pattern, Trunk flexed, Decreased stride length       General Gait Details: Pt demonstrates some instability with RW, but no overt LOB. CGA wtih intermittent min A for RW management. Cues for upright posture and RW proximity       Balance Overall balance assessment: Needs assistance Sitting-balance support: Bilateral upper extremity supported, Feet supported Sitting balance-Leahy Scale: Good Sitting balance - Comments: EOB   Standing balance support: Bilateral upper extremity supported, During functional activity, Reliant on assistive device for balance Standing balance-Leahy Scale: Poor Standing balance comment: with RW support                            Cognition Arousal: Alert Behavior During Therapy: WFL for tasks assessed/performed, Flat affect Overall Cognitive Status: Within Functional Limits for tasks assessed                                          Exercises  General Comments        Pertinent Vitals/Pain Pain Assessment Pain Assessment: Faces Faces Pain Scale: Hurts a little bit Pain Location: R shoulder Pain Descriptors / Indicators: Discomfort, Grimacing, Aching Pain Intervention(s): Monitored during session     PT Goals (current goals can now be found in the care plan section) Acute Rehab PT  Goals Patient Stated Goal: to get better PT Goal Formulation: With patient Time For Goal Achievement: 05/18/23 Progress towards PT goals: Progressing toward goals    Frequency    Min 1X/week       AM-PAC PT "6 Clicks" Mobility   Outcome Measure  Help needed turning from your back to your side while in a flat bed without using bedrails?: A Little Help needed moving from lying on your back to sitting on the side of a flat bed without using bedrails?: A Little Help needed moving to and from a bed to a chair (including a wheelchair)?: A Lot Help needed standing up from a chair using your arms (e.g., wheelchair or bedside chair)?: A Lot Help needed to walk in hospital room?: A Little Help needed climbing 3-5 steps with a railing? : Total 6 Click Score: 14    End of Session Equipment Utilized During Treatment: Gait belt Activity Tolerance: Patient tolerated treatment well Patient left: in chair;with chair alarm set;with call bell/phone within reach Nurse Communication: Mobility status PT Visit Diagnosis: Other abnormalities of gait and mobility (R26.89)     Time: 1610-9604 PT Time Calculation (min) (ACUTE ONLY): 29 min  Charges:    $Gait Training: 8-22 mins $Therapeutic Activity: 8-22 mins PT General Charges $$ ACUTE PT VISIT: 1 Visit                     Johny Shock, PTA Acute Rehabilitation Services Secure Chat Preferred  [phone]    Johny Shock 05/05/2023, 3:42 PM
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
## 2023-05-05 NOTE — Plan of Care (Signed)
Patient more active today and improving.

## 2023-05-05 NOTE — Discharge Instructions (Signed)
In a time of Crisis: [facility]  Mobile Crisis Management provides immediate crisis response, 24/7.  Call [phone]  [facility] for MH/DD/SA [facility] is available 24 hours a day, 7 days a week. Customer Service Specialists will assist you to find a crisis provider that is well-matched with your needs. Your local number is: [phone]  [facility] ([facility]) IOP, individual counseling, medication management [address] [place], [name] [address] [phone] Call for intake hours; Medicaid and Uninsured    Outpatient Providers  [facility] ([facility]) Group and individual counseling. [address]  [place], [name] [address] [phone] [place]: [phone]  [place]: [phone] Medicaid and uninsured.   [facility] Offers IOP groups multiple times per week. [address] [place], [name] [address] [phone] Takes Medicaid and other insurances.   [name]  Chemical Dependency Intensive Outpatient Program (IOP) [address] [place], [name] [address] [phone] Takes Nurse, learning disability and [place].   [facility]  IOP and Partial Hospitalization Program  [address]  [place], [name] [address] [phone] Private Insurance, [place] only for partial hospitalization  [facility] [address], [address], [place], [name] [address] [phone] Monday-Friday. Short and Long term options. [facility] (BHUC) IOP, individual counseling, medication management [address] [place], [name] [address] [phone] Medicaid and [facility]  [facility] [address]  [place], [name] [address] [phone] Private Insurance and Self Pay   [facility] Outpatient [address]  [place], [name] [address] [phone] Private Insurance, [place], and Self Pay   [facility]one [facility]  [address] [place], [name] [address] [facility]  [address]  [place], [name] [address] [phone]  [facility]  [address] [place], [name] [address] [phone]  [facility] [phone] Marcy Panning and [facility]      Residential Treatment Programs  [facility] [address] [place], [name] [address] [phone] or [phone] Detox and Residential Rehab 14 days (Medicare, Medicaid, private insurance, and self pay). No methadone. Call for pre-screen.   [facility]) [address]  [place], [name] [address] [phone] Detox (self Pay and Medicaid Limited availability) Rehab Only Male (Medicare, Medicaid, and Self Pay)-No methadone.  [facility] [address] [place], [name] [address] [phone] or [phone] Private Insurance only  [facility] [place] [address] [place], [name] [address] Phone:  [phone] Must be detoxed 72 hours prior to admission; 28 day program.  Self-pay.  [facility] [address]  [place], [name] [phone] [employer], Medicare, [place] (not straight [place]). They offer assistance with transportation.   [facility] [address] [place],  [place], [name] [address] [phone] Christian Based Program. Men only. No insurance  [facility] [address] [place], [name] [address] Women's: [phone] Men's: [phone] No Medicaid.   [facility] [place] Locations across the [place] (mainly [place]) willing to help with transportation.  [phone] [employer]. [facility]  [address]  [place], [name] [address] [phone] Treatment Only, must make  assessment appointment, and must be sober for assessment appointment. Self pay, Medicare A and B, [facility],  must be [facility] resident. No methadone.   [address] [place], [name] [address] Phone: [phone] Inpatient 24/7 and outpatient services. Individuals with Medicaid have no obligation for a copay. Individuals with Medicare or private insurance will be obligated to meet their policy's requirement(s). Individuals who are uninsured will be eligible for a sliding or discounted scaled  [facility]  [address] [place], [name] [address] [phone] No pending legal charges, Long-term work program. No methadone. Call for assessment.  [facility]  [address] [place], [name] [address] [phone] or [phone] Commercial Insurance Only  [facility] - [phone] Private Insurance (no [place]). Males/Females, call to make referrals, multiple facilities   [facility] [address]  [place], [name] [address]  [phone] Men Only Upfront Fee   [facility]no methadone [facility] [address] [place], [name] [address] [phone] (f)         AA Meetings Website to locate meetings (virtually or in person): [email or web address]/ Phone: [phone]  Syringe Services Program: Due to COVID-19, syringe services programs are likely operating under different hours with limited or no fixed site hours. Some programs may not be operating at all. Please contact the program directly using the phone numbers provided below to see if they are still operating under COVID-19.  [facility] Solution to the Opioid Problem (GCSTOP) Fixed; mobile; peer-based Roxy Cedar [phone] [email or web address] Fixed site exchange at [facility], [address] [place], [name] [address] on Wednesdays (2:00 - 5:00 pm) and Thursdays  (4:00 - 8:00 pm). Pop-up mobile exchange locations: [employer] and [employer], [address] [place], [name] [address] on Tuesdays (11:00 am - 1:00 pm) and Fridays (11:00 am - 1:00 pm)  -[facility] - [address], [place], [name] [address] on Tuesdays (2:00 - 4:00 pm) and Fridays (2:00 - 4:00 pm)  -[facility] copy - also serves Radio broadcast assistant and [employer] [place] [employer]; Fixed; mobile; peer-based; [place] Ka [phone] [email or web address] [address] [place], [name] [address] Delivery and outreach available in [place] and [place], please call for more information. Monday, Tuesday: 1:00 -7:00 pm, Thursday: 4:00 pm - 8:00 pm or Friday: 1:00 pm - 8:00 pm  Medication-Assisted Treatment (MAT):  -[facility]- services [address] and surrounding areas including [place], [place], [place], [place], [address], [place], [place], [place], [place], and [place], [place]. Options include Methadone, buprenorphine or Suboxone. [address] [place], [name] [address] Phone: [phone] Mon - Fri: 5:30am - 2:00pm Sat: 5:30am -7:30am Sun: Closed Holidays: 6:00am - 8:00am  -[facility]- We use FDA-approved medications, like methadone/suboxone/sublocade, and vivitrol. These medications are then combined with customized care plans that include individual or group counseling, toxicology, and medical care directed by on-site physicians. Accepts most insurance plans, Medicaid, and private pay.  [address] [place], [name] [address] Phone: [phone] Monday-Friday 5:00 AM - 10:00 AM Saturday 6:00 AM - 8:30 AM Sunday 6:00 AM - 7:00 AM  -[facility]- [facility] is a treatment & recovery focused program. In addition to receiving methadone medication, our clients participate in individual and group counseling as well as random drug testing. If accepted into the [facility] Opioid Program, you  will be provided several intake appointments and a physical exam [address] [place], [name] [address] Office: [phone]  Fax: [phone]  -[facility]- We put our community members at the  center of everything we do, for remote treatment services as well as in-person, from alcohol withdrawal to opioid use and more.  [address] [place] [address], [place], [name] [address] [phone] Monday-Wednesday: 9:00am - 5:00pm Thursday: 9:00am - 6:00pm Friday: 9:00am - 5:00pm Saturday: 9:00am - 1:00pm Sunday: Closed  -[facility] [place]) [address], [place], [name] [address] [phone]  [place] [phone] [address] [place], [name] [address]  M-W    5:00am-12:00pm Thu     5:00am-10:00am Fri       5:00am-12:00pm Sat      5:00am-8:00am Sun     Closed  $12/daily for Methadone Treatment.    Do not drive, operate heavy machinery, perform activities at heights, swimming or participation in water activities until you have seen by Primary MD or a Neurologist and advised to do so again.  Follow with Primary MD [name], NP in 7 days   Get CBC, CMP  -  checked next visit with your primary MD   Activity: As tolerated with Full fall precautions use walker/cane & assistance as needed  Disposition Home **  Diet: Heart Healthy with strict 1.2 L total fluid restriction per day.  Special Instructions: If you have smoked or chewed Tobacco  in the last 2 yrs please stop smoking, stop any regular Alcohol  and or any Recreational drug use.  On your next visit with your primary care physician please Get Medicines reviewed and adjusted.  Please request your Prim.MD to go over all Hospital Tests and Procedure/Radiological results at the follow up, please get all Hospital records sent to your Prim MD by signing hospital release before you go home.  If you experience worsening of your admission symptoms, develop shortness of breath,  life threatening emergency, suicidal or homicidal thoughts you must seek medical attention immediately by calling 911 or calling your MD immediately  if symptoms less severe.  You Must read complete instructions/literature along with all the possible adverse reactions/side effects for all the Medicines you take and that have been prescribed to you. Take any new Medicines after you have completely understood and accpet all the possible adverse reactions/side effects.   Do not drive when taking Pain medications.  Do not take more than prescribed Pain, Sleep and Anxiety Medications  Wear Seat belts while driving.   Please note  You were cared for by a hospitalist during your hospital stay. If you have any questions about your discharge medications or the care you received while you were in the hospital after you are discharged, you can call the unit and asked to speak with the hospitalist on call if the hospitalist that took care of you is not available. Once you are discharged, your primary care physician will handle any further medical issues. Please note that NO REFILLS for any discharge medications will be authorized once you are discharged, as it is imperative that you return to your primary care physician (or establish a relationship with a primary care physician if you do not have one) for your aftercare needs so that they can reassess your need for medications and monitor your lab values.

## 2023-05-05 NOTE — TOC Initial Note (Signed)
Transition of Care [facility]) - Initial/Assessment Note    Patient Details  Name: Richard Andrews MRN: [id number] Date of Birth: [birth year]  [facility]) CM/SW Contact:    Mearl Latin, LCSW Phone Number: 05/05/2023, 5:36 PM  Clinical Narrative:                 CSW received consult for possible SNF at time of discharge. CSW spoke with patient. Patient reported that he would like home health services and does not want to go back to SNF. He lives with his brother in law and he is on his separated wife's insurance. He is retired. If home health is unable to be located due to insurance, patient declined outpatient therapy. Patient has a rolling walker and wheelchair at home. CSW confirmed address. ETOH resources placed on AVS. No further questions reported at this time.    Expected Discharge Plan: Home w Home Health Services Barriers to Discharge: Continued Medical Work up [facility] acceptance)   Patient Goals and CMS Choice Patient states their goals for this hospitalization and ongoing recovery are:: Return home CMS Medicare.gov Compare Post Acute Care list provided to:: Patient Choice offered to / list presented to : Patient [place] ownership interest in [facility].provided to:: Patient    Expected Discharge Plan and Services In-house Referral: Clinical Social Work Discharge Planning Services: CM Consult Post Acute Care Choice: Home Health Living arrangements for the past 2 months: Single Family Home                                      Prior Living Arrangements/Services Living arrangements for the past 2 months: Single Family Home Lives with:: Relatives (BIL) Patient language and need for interpreter reviewed:: Yes Do you feel safe going back to the place where you live?: Yes      Need for Family Participation in Patient Care: Yes (Comment) Care giver support system in place?: Yes (comment) Current home services: DME (walker,  wheelchair) Criminal Activity/Legal Involvement Pertinent to Current Situation/Hospitalization: No - Comment as needed  Activities of Daily Living   ADL Screening (condition at time of admission) Independently performs ADLs?: No Does the patient have a NEW difficulty with bathing/dressing/toileting/self-feeding that is expected to last >3 days?: No Does the patient have a NEW difficulty with getting in/out of bed, walking, or climbing stairs that is expected to last >3 days?: No Does the patient have a NEW difficulty with communication that is expected to last >3 days?: No Is the patient deaf or have difficulty hearing?: No Does the patient have difficulty seeing, even when wearing glasses/contacts?: No Does the patient have difficulty concentrating, remembering, or making decisions?: No  Permission Sought/Granted Permission sought to share information with : Photographer granted to share info w AGENCY: HH        Emotional Assessment Appearance:: Appears stated age Attitude/Demeanor/Rapport: Engaged Affect (typically observed): Accepting, Appropriate Orientation: : Oriented to Self, Oriented to Place, Oriented to  Time, Oriented to Situation Alcohol / Substance Use: Alcohol Use Psych Involvement: No (comment)  Admission diagnosis:  Subdural hematoma (HCC) [S06.5XAA] Weakness [R53.1] Thrombocytopenia (HCC) [D69.6] Chronic subdural hematoma (HCC) [I62.03] Acute pain of right shoulder [M25.511] Patient Active Problem List   Diagnosis Date Noted   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   Kidney stone 02/16/2023   Colitis due to Clostridioides  difficile 01/25/2023   Enterococcal bacteremia 01/24/2023   History of seizure due to alcohol withdrawal 01/23/2023   History of bleeding peptic ulcer 01/23/2023   Hyponatremia 01/22/2023   Lactic acidosis 01/22/2023   Hypokalemia 01/22/2023   Hypomagnesemia 01/22/2023   Physical deconditioning  01/22/2023   Hyperbilirubinemia 01/22/2023   Hepatic steatosis 01/22/2023   SDH (subdural hematoma) (HCC) 01/22/2023   High anion gap metabolic acidosis 01/22/2023   Closed nondisplaced fracture of left tibial plateau 11/21/2022   ICH (intracerebral hemorrhage) (HCC) 11/15/2022   Subdural hematoma (HCC) 10/07/2022   Fall 09/24/2022   Alcohol withdrawal syndrome without complication (HCC)    Gastritis and gastroduodenitis    Multiple gastric ulcers    Duodenal ulcer    Acute blood loss anemia    Alcohol withdrawal seizure with complication (HCC)    GI bleed 01/19/2021   QT prolongation 01/19/2021   Electrolyte imbalance 02/06/2020   Macrocytic anemia 02/06/2020   Acute pain of right wrist 02/06/2020   Encounter for orthopedic follow-up care 01/28/2020   Open Colles' fracture of right radius 01/10/2020   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 [name] [address] Phone: [phone] Fax: [phone]  [name] [address] [name] [address] Phone: [phone] Fax: [phone]     Social Determinants of Health (SDOH) Social History: SDOH Screenings   Food Insecurity: No Food Insecurity (05/03/2023)  Housing: Patient Declined (05/03/2023)  Transportation Needs: No Transportation Needs (05/03/2023)  Utilities: Not At Risk (05/03/2023)  Depression (PHQ2-9): Low Risk  (03/07/2023)  Tobacco Use: High Risk (05/03/2023)   SDOH Interventions:     Readmission Risk Interventions     No data to display

## 2023-05-05 NOTE — Progress Notes (Signed)
Mobility Specialist Progress Note;   05/05/23 1545  Mobility  Activity Transferred from chair to bed  Level of Assistance Moderate assist, patient does 50-74%  Assistive Device Front wheel walker  Activity Response Tolerated well  Mobility Referral Yes  $Mobility charge 1 Mobility  Mobility Specialist Start Time (ACUTE ONLY) 1545  Mobility Specialist Stop Time (ACUTE ONLY) 1555  Mobility Specialist Time Calculation (min) (ACUTE ONLY) 10 min   Answered pts call bell requesting to get back into bed. Required ModA 2x STS and during pivot steps from chair to bed. Required min verbal cues for hand to walker/chair placement for STS. No c/o when asked. VSS throughout. Pt left in bed with all needs met. Bed alarm on.   Caesar Bookman Mobility Specialist Please contact via SecureChat or Rehab Office [phone]

## 2023-05-06 DIAGNOSIS — I6203 Nontraumatic chronic subdural hemorrhage: Secondary | ICD-10-CM | POA: Diagnosis not present

## 2023-05-06 LAB — CBC WITH DIFFERENTIAL/PLATELET
Abs Immature Granulocytes: 0.03 10*3/uL (ref 0.00–0.07)
Basophils Absolute: 0 10*3/uL (ref 0.0–0.1)
Basophils Relative: 1 %
Eosinophils Absolute: 0.2 10*3/uL (ref 0.0–0.5)
Eosinophils Relative: 5 %
HCT: 29.1 % — ABNORMAL LOW (ref 39.0–52.0)
Hemoglobin: 10.2 g/dL — ABNORMAL LOW (ref 13.0–17.0)
Immature Granulocytes: 1 %
Lymphocytes Relative: 27 %
Lymphs Abs: 1 10*3/uL (ref 0.7–4.0)
MCH: 34.3 pg — ABNORMAL HIGH (ref 26.0–34.0)
MCHC: 35.1 g/dL (ref 30.0–36.0)
MCV: 98 fL (ref 80.0–100.0)
Monocytes Absolute: 0.8 10*3/uL (ref 0.1–1.0)
Monocytes Relative: 21 %
Neutro Abs: 1.7 10*3/uL (ref 1.7–7.7)
Neutrophils Relative %: 45 %
Platelets: 105 10*3/uL — ABNORMAL LOW (ref 150–400)
RBC: 2.97 MIL/uL — ABNORMAL LOW (ref 4.22–5.81)
RDW: 13.7 % (ref 11.5–15.5)
WBC: 3.7 10*3/uL — ABNORMAL LOW (ref 4.0–10.5)
nRBC: 0 % (ref 0.0–0.2)

## 2023-05-06 LAB — PHOSPHORUS: Phosphorus: 4.9 mg/dL — ABNORMAL HIGH (ref 2.5–4.6)

## 2023-05-06 LAB — COMPREHENSIVE METABOLIC PANEL
ALT: 26 U/L (ref 0–44)
AST: 82 U/L — ABNORMAL HIGH (ref 15–41)
Albumin: 3.3 g/dL — ABNORMAL LOW (ref 3.5–5.0)
Alkaline Phosphatase: 128 U/L — ABNORMAL HIGH (ref 38–126)
Anion gap: 12 (ref 5–15)
BUN: 10 mg/dL (ref 8–23)
CO2: 23 mmol/L (ref 22–32)
Calcium: 9.4 mg/dL (ref 8.9–10.3)
Chloride: 96 mmol/L — ABNORMAL LOW (ref 98–111)
Creatinine, Ser: 1.09 mg/dL (ref 0.61–1.24)
GFR, Estimated: >60 mL/min (ref >60)
Glucose, Bld: 95 mg/dL (ref 70–99)
Potassium: 4 mmol/L (ref 3.5–5.1)
Sodium: 131 mmol/L — ABNORMAL LOW (ref 135–145)
Total Bilirubin: 1.2 mg/dL — ABNORMAL HIGH (ref <1.2)
Total Protein: 7.6 g/dL (ref 6.5–8.1)

## 2023-05-06 LAB — MAGNESIUM: Magnesium: 1.7 mg/dL (ref 1.7–2.4)

## 2023-05-06 LAB — GLUCOSE, CAPILLARY
Glucose-Capillary: 118 mg/dL — ABNORMAL HIGH (ref 70–99)
Glucose-Capillary: 120 mg/dL — ABNORMAL HIGH (ref 70–99)

## 2023-05-06 LAB — BRAIN NATRIURETIC PEPTIDE: B Natriuretic Peptide: 23.7 pg/mL (ref 0.0–100.0)

## 2023-05-06 MED ORDER — FUROSEMIDE 10 MG/ML IJ SOLN
20.0000 mg | Freq: Once | INTRAMUSCULAR | Status: AC
  Administered 2023-05-06: 20 mg via INTRAVENOUS
  Filled 2023-05-06: qty 2

## 2023-05-06 MED ORDER — MAGNESIUM SULFATE 2 GM/50ML IV SOLN
2.0000 g | Freq: Once | INTRAVENOUS | Status: AC
  Administered 2023-05-06: 2 g via INTRAVENOUS
  Filled 2023-05-06: qty 50

## 2023-05-06 NOTE — Plan of Care (Signed)
Patient more alert but less active he refused to get up today. Patient is afraid of falling.
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
## 2023-05-07 DIAGNOSIS — I6203 Nontraumatic chronic subdural hemorrhage: Secondary | ICD-10-CM | POA: Diagnosis not present

## 2023-05-07 LAB — CBC WITH DIFFERENTIAL/PLATELET
Abs Immature Granulocytes: 0.05 10*3/uL (ref 0.00–0.07)
Basophils Absolute: 0 10*3/uL (ref 0.0–0.1)
Basophils Relative: 1 %
Eosinophils Absolute: 0.1 10*3/uL (ref 0.0–0.5)
Eosinophils Relative: 3 %
HCT: 29.8 % — ABNORMAL LOW (ref 39.0–52.0)
Hemoglobin: 10.2 g/dL — ABNORMAL LOW (ref 13.0–17.0)
Immature Granulocytes: 1 %
Lymphocytes Relative: 30 %
Lymphs Abs: 1.2 10*3/uL (ref 0.7–4.0)
MCH: 33.7 pg (ref 26.0–34.0)
MCHC: 34.2 g/dL (ref 30.0–36.0)
MCV: 98.3 fL (ref 80.0–100.0)
Monocytes Absolute: 1 10*3/uL (ref 0.1–1.0)
Monocytes Relative: 25 %
Neutro Abs: 1.7 10*3/uL (ref 1.7–7.7)
Neutrophils Relative %: 40 %
Platelets: 141 10*3/uL — ABNORMAL LOW (ref 150–400)
RBC: 3.03 MIL/uL — ABNORMAL LOW (ref 4.22–5.81)
RDW: 13.9 % (ref 11.5–15.5)
WBC: 4.2 10*3/uL (ref 4.0–10.5)
nRBC: 0 % (ref 0.0–0.2)

## 2023-05-07 LAB — COMPREHENSIVE METABOLIC PANEL
ALT: 24 U/L (ref 0–44)
AST: 62 U/L — ABNORMAL HIGH (ref 15–41)
Albumin: 3.4 g/dL — ABNORMAL LOW (ref 3.5–5.0)
Alkaline Phosphatase: 130 U/L — ABNORMAL HIGH (ref 38–126)
Anion gap: 9 (ref 5–15)
BUN: 16 mg/dL (ref 8–23)
CO2: 24 mmol/L (ref 22–32)
Calcium: 9.3 mg/dL (ref 8.9–10.3)
Chloride: 98 mmol/L (ref 98–111)
Creatinine, Ser: 0.96 mg/dL (ref 0.61–1.24)
GFR, Estimated: >60 mL/min (ref >60)
Glucose, Bld: 104 mg/dL — ABNORMAL HIGH (ref 70–99)
Potassium: 3.6 mmol/L (ref 3.5–5.1)
Sodium: 131 mmol/L — ABNORMAL LOW (ref 135–145)
Total Bilirubin: 1.1 mg/dL (ref <1.2)
Total Protein: 7.6 g/dL (ref 6.5–8.1)

## 2023-05-07 LAB — BRAIN NATRIURETIC PEPTIDE: B Natriuretic Peptide: 10.2 pg/mL (ref 0.0–100.0)

## 2023-05-07 LAB — MAGNESIUM: Magnesium: 1.9 mg/dL (ref 1.7–2.4)

## 2023-05-07 LAB — PHOSPHORUS: Phosphorus: 4.8 mg/dL — ABNORMAL HIGH (ref 2.5–4.6)

## 2023-05-07 MED ORDER — POTASSIUM CHLORIDE CRYS ER 20 MEQ PO TBCR
40.0000 meq | EXTENDED_RELEASE_TABLET | Freq: Once | ORAL | Status: AC
  Administered 2023-05-07: 40 meq via ORAL
  Filled 2023-05-07: qty 2

## 2023-05-07 MED ORDER — FUROSEMIDE 10 MG/ML IJ SOLN
20.0000 mg | Freq: Once | INTRAMUSCULAR | Status: AC
  Administered 2023-05-07: 20 mg via INTRAVENOUS
  Filled 2023-05-07: qty 2

## 2023-05-07 NOTE — Progress Notes (Signed)
Occupational Therapy Treatment Patient Details Name: Richard Andrews MRN: [id number] DOB: April 20, 1961 Today's Date: 05/07/2023   History of present illness 62 y.o. male with medical history significant for chronic alcohol abuse, hypertension, hepatic steatosis, traumatic subdural hemorrhage August 2024, he lives at home with his brother-in-law and constantly drinks alcohol, brother-in-law noted that patient was not as active for the last 5 days he was brought to [facility], ER where CT of the head showed acute on chronic subdural hematoma he was transferred to [facility] for further care.   OT comments  Pt. Seen for skilled OT treatment session.  Pt. Agreeable to oob to recliner for simulated toileting task.  Demonstrates good carry over with verbal cues for bed mobility.  Some light trunk support required but otherwise no physical assistance.  Pt. Able to complete sit/stand and step pivot to recliner with cga and cues for hand placement prior to sitting down.  Pt. With less physical or verbal stated fear of falling this day.  Cont. With acute OT POC.        If plan is discharge home, recommend the following:  A lot of help with walking and/or transfers;Two people to help with walking and/or transfers;Assistance with cooking/housework   Equipment Recommendations  None recommended by OT    Recommendations for Other Services      Precautions / Restrictions         Mobility Bed Mobility Overal bed mobility: Needs Assistance Bed Mobility: Rolling, Sidelying to Sit Rolling: Min assist, Used rails Sidelying to sit: Min assist       General bed mobility comments: cues for use of rails, and also some light trunk support to transition into sitting    Transfers Overall transfer level: Needs assistance Equipment used: Rolling walker (2 wheels) Transfers: Sit to/from Stand, Bed to chair/wheelchair/BSC Sit to Stand: Contact guard assist     Step pivot transfers: Contact guard assist      General transfer comment: cues for pivot but pt. managed well and followed additional cues for hand placement on arm rests prior to sitting down     Balance                                           ADL either performed or assessed with clinical judgement   ADL Overall ADL's : Needs assistance/impaired                         Toilet Transfer: Contact guard assist;Stand-pivot;Rolling walker (2 wheels) Toilet Transfer Details (indicate cue type and reason): simulated during transfer from eob to recliner         Functional mobility during ADLs: Contact guard assist General ADL Comments: pt. demonstrating less fear of falling than previously documented agreeable and ready for oob when presented to hiim    Extremity/Trunk Assessment              Vision       Perception     Praxis      Cognition Arousal: Alert Behavior During Therapy: WFL for tasks assessed/performed, Flat affect Overall Cognitive Status: Within Functional Limits for tasks assessed  Exercises      Shoulder Instructions       General Comments      Pertinent Vitals/ Pain       Pain Assessment Pain Assessment: Faces Faces Pain Scale: Hurts a little bit Pain Location: R shoulder Pain Descriptors / Indicators: Aching Pain Intervention(s): Limited activity within patient's tolerance, Monitored during session  Home Living                                          Prior Functioning/Environment              Frequency  Min 1X/week        Progress Toward Goals  OT Goals(current goals can now be found in the care plan section)  Progress towards OT goals: Progressing toward goals     Plan      Co-evaluation                 AM-PAC OT "6 Clicks" Daily Activity     Outcome Measure   Help from another person eating meals?: None Help from another person taking care of  personal grooming?: A Little Help from another person toileting, which includes using toliet, bedpan, or urinal?: A Lot Help from another person bathing (including washing, rinsing, drying)?: A Little Help from another person to put on and taking off regular upper body clothing?: A Little Help from another person to put on and taking off regular lower body clothing?: A Little 6 Click Score: 18    End of Session Equipment Utilized During Treatment: Rolling walker (2 wheels)  OT Visit Diagnosis: Unsteadiness on feet (R26.81);Other abnormalities of gait and mobility (R26.89);Muscle weakness (generalized) (M62.81);Pain Pain - Right/Left: Right Pain - part of body: Shoulder   Activity Tolerance Patient tolerated treatment well   Patient Left in chair;with call bell/phone within reach;with chair alarm set   Nurse Communication Other (comment) (communicated with cna, pt. up with chair alarm and o2 sensor not reading, she states she will reapply. secure chat if bp cuff needed to be reapplied or not)        Time: 1216-1229 OT Time Calculation (min): 13 min  Charges: OT General Charges $OT Visit: 1 Visit OT Treatments $Therapeutic Activity: 8-22 mins  Boneta Lucks, COTA/L Acute Rehabilitation [phone]   Alessandra Bevels Lorraine-COTA/L 05/07/2023, 12:47 PM

## 2023-05-07 NOTE — Progress Notes (Signed)
PROGRESS NOTE                                                                                                                                                                                                             Patient Demographics:    Richard Andrews, is a 62 y.o. male, DOB - 1961/03/26, AOZ:[id number]  Outpatient Primary MD for the patient is [name], NP    LOS - 3  Admit date - 05/03/2023    Chief Complaint  Patient presents with   Shoulder Pain       Brief Narrative (HPI from H&P)    62 y.o. male with medical history significant for chronic alcohol abuse, hypertension, hepatic steatosis, traumatic subdural hemorrhage August 2024, he lives at home with his brother-in-law and constantly drinks alcohol, brother-in-law noted that patient was not as active for the last 5 days he was brought to [facility], ER where CT of the head showed acute on chronic subdural hematoma he was transferred to [facility] for further care.  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   Subjective:   Patient in bed, appears comfortable, denies any headache, no fever, no chest pain or pressure, no shortness of breath , no abdominal pain. No focal weakness.   Assessment  & Plan :    Acute on chronic subdural hematoma-in the setting of worsening chronic thrombocytopenia likely due to alcohol abuse, multiple intermittent falls at home, note from neurosurgery appreciated.  Currently headache free no focal deficits, repeat head CT on 05/04/2023 remains stable, 2 units of platelets transfused on 05/04/2019 for further to ensure stable platelet levels in the setting of subdural hematoma.  Continue to monitor, clinically stable now.   Pancytopenia with severe thrombocytopenia-normal in September of this year, now with recurrent drop.   In the setting of suspected acute bleeding, receiving platelet transfusion as above, stable anemia panel and peripheral smear, fatty liver on ultrasound.   Alcohol abuse, and early DTs. - Still to quit, CIWA protocol, Librium.  Monitor.   Abnormal LFT - liver on ultrasound, PCP to monitor outpatient GI follow-up.   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  Appropriate electrolytes serum and urine ordered.  Hold further IV fluids, gentle Lasix and fluid restriction continued.  Not compliant  with fluid restriction, patient and staff counseled on 05/07/2023.   Right shoulder pain and weakness - PT/OT evaluation, x-ray nonacute.  Continue to monitor.   Severe hypophosphatemia, hypomagnesemia and hypokalemia.  Replaced.       Condition - Extremely Guarded  Family Communication  :  None  Code Status :  Full  Consults  :  NS - Jones  PUD Prophylaxis :    Procedures  :     Right upper quadrant ultrasound.   Distended gallbladder with sludge including a possible areas more tumefactive sludge. No shadowing stones, wall thickening.   No ductal dilatation. Fatty liver infiltration.  CT - 1. Chronic right subdural hematoma overlying the right cerebral convexity is again noted, which contains scattered foci of increased density measuring up to 60 Hounsfield units compatible with acute on chronic hematoma. This measures 7 mm in maximum thickness which is similar to 04/13/2023. No significant mass effect or midline shift identified. 2. Diffuse cerebral and cerebellar atrophy with chronic small vessel ischemic disease. 3. Unchanged left basal ganglia lacunar infarct.      Disposition Plan  :    Status is: Observation  DVT Prophylaxis  :    Place and maintain sequential compression device Start: 05/04/23 0544 SCDs Start: 05/03/23 0844    Lab Results  Component Value Date   PLT 141 (L) 05/07/2023    Diet :  Diet Order             Diet regular Room service appropriate? Yes; Fluid  consistency: Thin; Fluid restriction: 1200 mL Fluid  Diet effective now                    Inpatient Medications  Scheduled Meds:  chlordiazePOXIDE  10 mg Oral TID   folic acid  1 mg Oral Daily   multivitamin with minerals  1 tablet Oral Daily   thiamine  100 mg Oral Daily   Or   thiamine  100 mg Intravenous Daily   Continuous Infusions:   PRN Meds:.acetaminophen **OR** acetaminophen, hydrALAZINE  Antibiotics  :    Anti-infectives (From admission, onward)    None         Objective:   Vitals:   05/06/23 2200 05/07/23 0000 05/07/23 0200 05/07/23 0400  BP:  99/71  119/77  Pulse: (!) 174 (!) 217  100  Resp: 16 19 16 14   Temp:    98 F (36.7 C)  TempSrc:    Axillary  SpO2: 97% 100%  100%    Wt Readings from Last 3 Encounters:  04/12/23 59 kg  03/21/23 59.1 kg  03/07/23 59.1 kg     Intake/Output Summary (Last 24 hours) at 05/07/2023 0751 Last data filed at 05/07/2023 0700 Gross per 24 hour  Intake 880 ml  Output 1750 ml  Net -870 ml     Physical Exam  Awake Alert, No new F.N deficits, Normal affect, mild DTs [place].AT,PERRAL Supple Neck, No JVD,   Symmetrical Chest wall movement, Good air movement bilaterally, CTAB RRR,No Gallops,Rubs or new Murmurs,  +ve B.Sounds, Abd Soft, No tenderness,   No Cyanosis, Clubbing or edema      Data Review:    Recent Labs  Lab 05/03/23 0606 05/04/23 0430 05/05/23 0338 05/06/23 0345 05/07/23 0359  WBC 3.8* 2.3* 2.7* 3.7* 4.2  HGB 11.5* 9.7* 9.7* 10.2* 10.2*  HCT 33.0* 27.2* 27.9* 29.1* 29.8*  PLT 55* 46* 71* 105* 141*  MCV 99.7 96.8 98.9 98.0 98.3  MCH 34.7*  34.5* 34.4* 34.3* 33.7  MCHC 34.8 35.7 34.8 35.1 34.2  RDW 13.3 13.4 13.3 13.7 13.9  LYMPHSABS 1.0  --  0.7 1.0 1.2  MONOABS 0.5  --  0.4 0.8 1.0  EOSABS 0.0  --  0.1 0.2 0.1  BASOSABS 0.0  --  0.0 0.0 0.0    Recent Labs  Lab 05/03/23 0606 05/03/23 0818 05/04/23 0430 05/05/23 0338 05/06/23 0345 05/07/23 0359  NA 132*  --  131* 131* 131*  131*  K 3.2*  --  2.9* 3.9 4.0 3.6  CL 93*  --  96* 101 96* 98  CO2 22  --  25 23 23 24   ANIONGAP 17*  --  10 7 12 9   GLUCOSE 79  --  99 102* 95 104*  BUN 15  --  9 6* 10 16  CREATININE 0.72  --  0.66 0.74 1.09 0.96  AST 108*  --  117* 135* 82* 62*  ALT 28  --  26 29 26 24   ALKPHOS 127*  --  119 115 128* 130*  BILITOT 2.3*  --  2.5* 1.8* 1.2* 1.1  ALBUMIN 3.9  --  3.2* 3.1* 3.3* 3.4*  INR  --  1.2  --   --   --   --   AMMONIA 26  --   --   --   --   --   BNP  --   --   --  67.8 23.7 10.2  MG  --   --  1.1* 1.6* 1.7 1.9  CALCIUM 9.0  --  9.2 9.0 9.4 9.3      Recent Labs  Lab 05/03/23 0606 05/03/23 0818 05/04/23 0430 05/05/23 0338 05/06/23 0345 05/07/23 0359  INR  --  1.2  --   --   --   --   AMMONIA 26  --   --   --   --   --   BNP  --   --   --  67.8 23.7 10.2  MG  --   --  1.1* 1.6* 1.7 1.9  CALCIUM 9.0  --  9.2 9.0 9.4 9.3    --------------------------------------------------------------------------------------------------------------- Lab Results  Component Value Date   CHOL 176 03/18/2016   HDL 70 03/18/2016   LDLCALC 94 03/18/2016   TRIG 61 03/18/2016   CHOLHDL 2.5 03/18/2016    Lab Results  Component Value Date   HGBA1C 5.2 03/18/2016      Radiology Reports US Abdomen Limited RUQ (LIVER/GB)  Result Date: 05/04/2023 CLINICAL DATA:  Transaminitis EXAM: ULTRASOUND ABDOMEN LIMITED RIGHT UPPER QUADRANT COMPARISON:  CT 04/12/2023 FINDINGS: Gallbladder: Gall dilated gallbladder. No wall thickening or adjacent fluid. There is some layering sludge. There is more lobular focus of echogenic material without shadowing measuring 11 mm. Based on appearance this could be tumefactive sludge. No abnormal blood flow on Doppler. Common bile duct: Diameter: 3 mm Liver: Diffusely echogenic hepatic parenchyma consistent with fatty liver infiltration. With this level of echogenicity evaluation for underlying mass lesion is limited and if needed follow-up contrast CT or MRI as  clinically appropriate portal vein is patent on color Doppler imaging with normal direction of blood flow towards the liver. Other: None. IMPRESSION: Distended gallbladder with sludge including a possible areas more tumefactive sludge. No shadowing stones, wall thickening. No ductal dilatation. Fatty liver infiltration. Electronically Signed   By: Karen Kays M.D.   On: 05/04/2023 18:13   CT HEAD WO CONTRAST ( )  Result Date: 05/04/2023 CLINICAL  DATA:  Stroke, follow up EXAM: CT HEAD WITHOUT CONTRAST TECHNIQUE: Contiguous axial images were obtained from the base of the skull through the vertex without intravenous contrast. RADIATION DOSE REDUCTION: This exam was performed according to the departmental dose-optimization program which includes automated exposure control, adjustment of the mA and/or kV according to patient size and/or use of iterative reconstruction technique. COMPARISON:  CT head 05/03/23 FINDINGS: Brain: Negative for an acute infarct. No mass lesion. No hydrocephalus. Redemonstrated 4.7 x 3.2 cm arachnoid cyst in the left middle cranial fossa. Unchanged 6 mm mixed density subdural hematoma along the right frontal convexity. There is a background of mild chronic microvascular ischemic change. Vascular: No hyperdense vessel or unexpected calcification. 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 Skull: Normal. Negative for fracture or focal lesion. Sinuses/Orbits: No middle ear or mastoid effusion. Paranasal sinuses are notable for mild mucosal thickening in the left maxillary sinus. Orbits are unremarkable. Other: None. IMPRESSION: Unchanged 6 mm mixed density subdural hematoma along the right frontal convexity. No new hemorrhage. Electronically Signed   By: Lorenza Cambridge M.D.   On: 05/04/2023 09:03      Signature  -   Susa Raring M.D on 05/07/2023 at 7:51 AM   -  To page go to [email or web address]

## 2023-05-08 DIAGNOSIS — I6203 Nontraumatic chronic subdural hemorrhage: Secondary | ICD-10-CM | POA: Diagnosis not present

## 2023-05-08 LAB — GLUCOSE, CAPILLARY
Glucose-Capillary: 129 mg/dL — ABNORMAL HIGH (ref 70–99)
Glucose-Capillary: 170 mg/dL — ABNORMAL HIGH (ref 70–99)
Glucose-Capillary: 161 mg/dL — ABNORMAL HIGH (ref 70–99)

## 2023-05-08 LAB — PHOSPHORUS: Phosphorus: 3.6 mg/dL (ref 2.5–4.6)

## 2023-05-08 LAB — BASIC METABOLIC PANEL
Anion gap: 8 (ref 5–15)
BUN: 19 mg/dL (ref 8–23)
CO2: 24 mmol/L (ref 22–32)
Calcium: 9.2 mg/dL (ref 8.9–10.3)
Chloride: 99 mmol/L (ref 98–111)
Creatinine, Ser: 1.08 mg/dL (ref 0.61–1.24)
GFR, Estimated: >60 mL/min (ref >60)
Glucose, Bld: 105 mg/dL — ABNORMAL HIGH (ref 70–99)
Potassium: 3.9 mmol/L (ref 3.5–5.1)
Sodium: 131 mmol/L — ABNORMAL LOW (ref 135–145)

## 2023-05-08 LAB — MAGNESIUM: Magnesium: 1.7 mg/dL (ref 1.7–2.4)

## 2023-05-08 MED ORDER — FUROSEMIDE 10 MG/ML IJ SOLN
20.0000 mg | Freq: Once | INTRAMUSCULAR | Status: AC
  Administered 2023-05-08: 20 mg via INTRAVENOUS
  Filled 2023-05-08: qty 2

## 2023-05-08 MED ORDER — MAGNESIUM SULFATE 2 GM/50ML IV SOLN
2.0000 g | Freq: Once | INTRAVENOUS | Status: AC
  Administered 2023-05-08: 2 g via INTRAVENOUS
  Filled 2023-05-08: qty 50

## 2023-05-08 MED ORDER — POTASSIUM CHLORIDE CRYS ER 20 MEQ PO TBCR
40.0000 meq | EXTENDED_RELEASE_TABLET | Freq: Once | ORAL | Status: AC
  Administered 2023-05-08: 40 meq via ORAL
  Filled 2023-05-08: qty 2

## 2023-05-08 NOTE — Plan of Care (Signed)
Problem: Education: Goal: Knowledge of General Education information will improve Description: Including pain rating scale, medication(s)/side effects and non-pharmacologic comfort measures Outcome: Not Progressing

## 2023-05-08 NOTE — Progress Notes (Signed)
Physical Therapy Treatment Patient Details Name: Richard Andrews MRN: [id number] DOB: [birth year] Today's Date: 05/08/2023   History of Present Illness 62 y.o. male who lives at home with his brother-in-law and constantly drinks alcohol, brother-in-law noted that patient was not as active for the last 5 days he was brought to [facility], ER where CT of the head showed acute on chronic subdural hematoma he was transferred to [facility] for further care. Medical history significant for chronic alcohol abuse, hypertension, hepatic steatosis, traumatic subdural hemorrhage August 2024    PT Comments  Pt received sitting in the recliner and agreeable to session. Pt demonstrates improved power up to stand with cues for anterior weight shift. Pt able to tolerate significantly increased gait distance with CGA for safety due to occasional drifting and difficulty with turns and obstacle negotiation. Pt demonstrates impaired awareness of deficits requiring cues for safety and does not state correct reason for admission. Pt continues to benefit from PT services to progress toward functional mobility goals.    If plan is discharge home, recommend the following: Two people to help with walking and/or transfers;A lot of help with bathing/dressing/bathroom;Assistance with cooking/housework;Direct supervision/assist for medications management;Assist for transportation;Help with stairs or ramp for entrance   Can travel by private vehicle     No  Equipment Recommendations  Other (comment) (per accepting facility)    Recommendations for Other Services       Precautions / Restrictions Precautions Precautions: Fall Restrictions Weight Bearing Restrictions: No     Mobility  Bed Mobility               General bed mobility comments: Pt began and ended session in recliner    Transfers Overall transfer level: Needs assistance Equipment used: Rolling walker (2 wheels) Transfers: Sit to/from Stand Sit to  Stand: Contact guard assist           General transfer comment: Cues for anterior weight shift and increased time for power up, but no physical assist needed    Ambulation/Gait Ambulation/Gait assistance: Contact guard assist Gait Distance (Feet): 420 Feet Assistive device: Rolling walker (2 wheels) Gait Pattern/deviations: Step-through pattern, Trunk flexed, Decreased stride length, Drifts right/left       General Gait Details: Pt demonstrates slow, stiff gait with cues for upright posture and RW proximity. Pt demonstrates occasional L drifting and increased difficulty with turns and obstacle negotiation requiring intermittent cues.      Balance Overall balance assessment: Needs assistance Sitting-balance support: Bilateral upper extremity supported, Feet supported Sitting balance-Leahy Scale: Good     Standing balance support: Bilateral upper extremity supported, During functional activity, Reliant on assistive device for balance Standing balance-Leahy Scale: Poor Standing balance comment: with RW support                            Cognition Arousal: Alert Behavior During Therapy: WFL for tasks assessed/performed, Flat affect Overall Cognitive Status: No family/caregiver present to determine baseline cognitive functioning                                 General Comments: Pt with decreased awareness of deficits and reason for admission with pt reporting he came for an injection due to shoulder pain.        Exercises      General Comments        Pertinent Vitals/Pain Pain Assessment Pain  Assessment: Faces Faces Pain Scale: Hurts a little bit Pain Location: R shoulder Pain Descriptors / Indicators: Aching Pain Intervention(s): Monitored during session     PT Goals (current goals can now be found in the care plan section) Acute Rehab PT Goals Patient Stated Goal: to get better PT Goal Formulation: With patient Time For Goal  Achievement: 05/18/23 Progress towards PT goals: Progressing toward goals    Frequency    Min 1X/week       AM-PAC PT "6 Clicks" Mobility   Outcome Measure  Help needed turning from your back to your side while in a flat bed without using bedrails?: A Little Help needed moving from lying on your back to sitting on the side of a flat bed without using bedrails?: A Little Help needed moving to and from a bed to a chair (including a wheelchair)?: A Little Help needed standing up from a chair using your arms (e.g., wheelchair or bedside chair)?: A Little Help needed to walk in hospital room?: A Little Help needed climbing 3-5 steps with a railing? : A Lot 6 Click Score: 17    End of Session Equipment Utilized During Treatment: Gait belt Activity Tolerance: Patient tolerated treatment well Patient left: in chair;with call bell/phone within reach Nurse Communication: Mobility status;Other (comment) (chair alarm battery needs changing) PT Visit Diagnosis: Other abnormalities of gait and mobility (R26.89)     Time: 1610-9604 PT Time Calculation (min) (ACUTE ONLY): 27 min  Charges:    $Gait Training: 23-37 mins PT General Charges $$ ACUTE PT VISIT: 1 Visit                     Johny Shock, PTA Acute Rehabilitation Services Secure Chat Preferred  [phone]    Johny Shock 05/08/2023, 1:05 PM

## 2023-05-08 NOTE — Progress Notes (Signed)
Mobility Specialist Progress Note;   05/08/23 1010  Mobility  Activity Transferred from bed to chair  Level of Assistance Minimal assist, patient does 75% or more  Assistive Device Front wheel walker  Activity Response Tolerated well  Mobility Referral Yes  $Mobility charge 1 Mobility  Mobility Specialist Start Time (ACUTE ONLY) 1010  Mobility Specialist Stop Time (ACUTE ONLY) 1025  Mobility Specialist Time Calculation (min) (ACUTE ONLY) 15 min   Pt agreeable to mobility. Required MinA for STS and during transfer from bed to chair. Min verbal cues required for RW proximity. Asked pt is wanted to attempt further ambulation in room, however pt stated he was fatigued and wanted to sit for now. Pt left in chair with all needs met.   Caesar Bookman Mobility Specialist Please contact via SecureChat or Rehab Office [phone]

## 2023-05-08 NOTE — Progress Notes (Signed)
PROGRESS NOTE                                                                                                                                                                                                             Patient Demographics:    Richard Andrews, is a 62 y.o. male, DOB - 08-07-1960, HQI:[id number]  Outpatient Primary MD for the patient is [name], NP    LOS - 4  Admit date - 05/03/2023    Chief Complaint  Patient presents with   Shoulder Pain       Brief Narrative (HPI from H&P)    62 y.o. male with medical history significant for chronic alcohol abuse, hypertension, hepatic steatosis, traumatic subdural hemorrhage August 2024, he lives at home with his brother-in-law and constantly drinks alcohol, brother-in-law noted that patient was not as active for the last 5 days he was brought to [facility], ER where CT of the head showed acute on chronic subdural hematoma he was transferred to [facility] for further care.  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   Subjective:   Patient in bed, appears comfortable, denies any headache, no fever, no chest pain or pressure, no shortness of breath , no abdominal pain. No new focal weakness.    Assessment  & Plan :    Acute on chronic subdural hematoma-in the setting of worsening chronic thrombocytopenia likely due to alcohol abuse, multiple intermittent falls at home, note from neurosurgery appreciated.  Currently headache free no focal deficits, repeat head CT on 05/04/2023 remains stable, 2 units of platelets transfused on 05/04/2019 for further to ensure stable platelet levels in the setting of subdural hematoma.  Continue to monitor, clinically stable now. Await SNF bed.   Pancytopenia with severe thrombocytopenia-normal in September of this year, now  with recurrent drop.  In the setting of suspected acute bleeding, receiving platelet transfusion as above, stable anemia panel and peripheral smear, fatty liver on ultrasound.   Alcohol abuse, and early DTs. - Still to quit, CIWA protocol, Librium.  Monitor.   Abnormal LFT - liver on ultrasound, PCP to monitor outpatient GI follow-up.   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  Appropriate electrolytes serum and urine ordered.  Hold further IV fluids, gentle Lasix  repeat  and fluid restriction continued.  Not compliant with fluid restriction, patient and staff counseled on 05/07/2023 - 05/08/23.   Right shoulder pain and weakness - PT/OT evaluation, x-ray nonacute.  Continue to monitor.   Severe hypophosphatemia, hypomagnesemia and hypokalemia.  Replaced.       Condition - Extremely Guarded  Family Communication  :  None  Code Status :  Full  Consults  :  NS - Jones  PUD Prophylaxis :    Procedures  :     Right upper quadrant ultrasound.   Distended gallbladder with sludge including a possible areas more tumefactive sludge. No shadowing stones, wall thickening.   No ductal dilatation. Fatty liver infiltration.  CT - 1. Chronic right subdural hematoma overlying the right cerebral convexity is again noted, which contains scattered foci of increased density measuring up to 60 Hounsfield units compatible with acute on chronic hematoma. This measures 7 mm in maximum thickness which is similar to 04/13/2023. No significant mass effect or midline shift identified. 2. Diffuse cerebral and cerebellar atrophy with chronic small vessel ischemic disease. 3. Unchanged left basal ganglia lacunar infarct.      Disposition Plan  :    Status is: Observation  DVT Prophylaxis  :    Place and maintain sequential compression device Start: 05/04/23 0544 SCDs Start: 05/03/23 0844    Lab Results  Component Value Date   PLT 141 (L) 05/07/2023    Diet :  Diet Order             Diet regular Room  service appropriate? Yes; Fluid consistency: Thin; Fluid restriction: 1200 mL Fluid  Diet effective now                    Inpatient Medications  Scheduled Meds:  chlordiazePOXIDE  10 mg Oral TID   folic acid  1 mg Oral Daily   multivitamin with minerals  1 tablet Oral Daily   thiamine  100 mg Oral Daily   Or   thiamine  100 mg Intravenous Daily   Continuous Infusions:  magnesium sulfate bolus IVPB 2 g (05/08/23 0629)    PRN Meds:.acetaminophen **OR** acetaminophen, hydrALAZINE  Antibiotics  :    Anti-infectives (From admission, onward)    None         Objective:   Vitals:   05/07/23 2200 05/08/23 0000 05/08/23 0200 05/08/23 0400  BP:  97/67  91/74  Pulse:    75  Resp: 17 17 16 12   Temp:  99 F (37.2 C)  98.6 F (37 C)  TempSrc:  Oral  Oral  SpO2:  100%  100%    Wt Readings from Last 3 Encounters:  04/12/23 59 kg  03/21/23 59.1 kg  03/07/23 59.1 kg     Intake/Output Summary (Last 24 hours) at 05/08/2023 0711 Last data filed at 05/08/2023 0425 Gross per 24 hour  Intake 480 ml  Output 450 ml  Net 30 ml     Physical Exam  Awake Alert, No new F.N deficits, Normal affect, mild DTs Wheeler.AT,PERRAL Supple Neck, No JVD,   Symmetrical Chest wall movement, Good air movement bilaterally, CTAB RRR,No Gallops,Rubs or new Murmurs,  +ve B.Sounds, Abd Soft, No tenderness,   No Cyanosis, Clubbing or edema      Data Review:    Recent Labs  Lab 05/03/23 0606 05/04/23 0430 05/05/23 0338 05/06/23 0345 05/07/23 0359  WBC 3.8* 2.3* 2.7* 3.7* 4.2  HGB 11.5* 9.7* 9.7* 10.2* 10.2*  HCT 33.0* 27.2*  27.9* 29.1* 29.8*  PLT 55* 46* 71* 105* 141*  MCV 99.7 96.8 98.9 98.0 98.3  MCH 34.7* 34.5* 34.4* 34.3* 33.7  MCHC 34.8 35.7 34.8 35.1 34.2  RDW 13.3 13.4 13.3 13.7 13.9  LYMPHSABS 1.0  --  0.7 1.0 1.2  MONOABS 0.5  --  0.4 0.8 1.0  EOSABS 0.0  --  0.1 0.2 0.1  BASOSABS 0.0  --  0.0 0.0 0.0    Recent Labs  Lab 05/03/23 0606 05/03/23 0818  05/04/23 0430 05/05/23 0338 05/06/23 0345 05/07/23 0359 05/08/23 0321  NA 132*  --  131* 131* 131* 131* 131*  K 3.2*  --  2.9* 3.9 4.0 3.6 3.9  CL 93*  --  96* 101 96* 98 99  CO2 22  --  25 23 23 24 24   ANIONGAP 17*  --  10 7 12 9 8   GLUCOSE 79  --  99 102* 95 104* 105*  BUN 15  --  9 6* 10 16 19   CREATININE 0.72  --  0.66 0.74 1.09 0.96 1.08  AST 108*  --  117* 135* 82* 62*  --   ALT 28  --  26 29 26 24   --   ALKPHOS 127*  --  119 115 128* 130*  --   BILITOT 2.3*  --  2.5* 1.8* 1.2* 1.1  --   ALBUMIN 3.9  --  3.2* 3.1* 3.3* 3.4*  --   INR  --  1.2  --   --   --   --   --   AMMONIA 26  --   --   --   --   --   --   BNP  --   --   --  67.8 23.7 10.2  --   MG  --   --  1.1* 1.6* 1.7 1.9 1.7  CALCIUM 9.0  --  9.2 9.0 9.4 9.3 9.2      Recent Labs  Lab 05/03/23 0606 05/03/23 0818 05/04/23 0430 05/05/23 0338 05/06/23 0345 05/07/23 0359 05/08/23 0321  INR  --  1.2  --   --   --   --   --   AMMONIA 26  --   --   --   --   --   --   BNP  --   --   --  67.8 23.7 10.2  --   MG  --   --  1.1* 1.6* 1.7 1.9 1.7  CALCIUM 9.0  --  9.2 9.0 9.4 9.3 9.2    --------------------------------------------------------------------------------------------------------------- Lab Results  Component Value Date   CHOL 176 03/18/2016   HDL 70 03/18/2016   LDLCALC 94 03/18/2016   TRIG 61 03/18/2016   CHOLHDL 2.5 03/18/2016    Lab Results  Component Value Date   HGBA1C 5.2 03/18/2016      Radiology Reports US Abdomen Limited RUQ (LIVER/GB)  Result Date: 05/04/2023 CLINICAL DATA:  Transaminitis EXAM: ULTRASOUND ABDOMEN LIMITED RIGHT UPPER QUADRANT COMPARISON:  CT 04/12/2023 FINDINGS: Gallbladder: Gall dilated gallbladder. No wall thickening or adjacent fluid. There is some layering sludge. There is more lobular focus of echogenic material without shadowing measuring 11 mm. Based on appearance this could be tumefactive sludge. No abnormal blood flow on Doppler. Common bile duct: Diameter:  3 mm Liver: Diffusely echogenic hepatic parenchyma consistent with fatty liver infiltration. With this level of echogenicity evaluation for underlying mass lesion is limited and if needed follow-up contrast CT or MRI as clinically  appropriate portal vein is patent on color Doppler imaging with normal direction of blood flow towards the liver. Other: None. IMPRESSION: Distended gallbladder with sludge including a possible areas more tumefactive sludge. No shadowing stones, wall thickening. No ductal dilatation. Fatty liver infiltration. Electronically Signed   By: Karen Kays M.D.   On: 05/04/2023 18:13   CT HEAD WO CONTRAST ( )  Result Date: 05/04/2023 CLINICAL DATA:  Stroke, follow up EXAM: CT HEAD WITHOUT CONTRAST TECHNIQUE: Contiguous axial images were obtained from the base of the skull through the vertex without intravenous contrast. RADIATION DOSE REDUCTION: This exam was performed according to the departmental dose-optimization program which includes automated exposure control, adjustment of the mA and/or kV according to patient size and/or use of iterative reconstruction technique. COMPARISON:  CT head 05/03/23 FINDINGS: Brain: Negative for an acute infarct. No mass lesion. No hydrocephalus. Redemonstrated 4.7 x 3.2 cm arachnoid cyst in the left middle cranial fossa. Unchanged 6 mm mixed density subdural hematoma along the right frontal convexity. There is a background of mild chronic microvascular ischemic change. Vascular: No hyperdense vessel or unexpected calcification. 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 Skull: Normal. Negative for fracture or focal lesion. Sinuses/Orbits: No middle ear or mastoid effusion. Paranasal sinuses are notable for mild mucosal thickening in the left maxillary sinus. Orbits are unremarkable. Other: None. IMPRESSION: Unchanged 6 mm mixed density subdural hematoma along the right frontal convexity. No new hemorrhage.  Electronically Signed   By: Lorenza Cambridge M.D.   On: 05/04/2023 09:03      Signature  -   Susa Raring M.D on 05/08/2023 at 7:11 AM   -  To page go to [email or web address]

## 2023-05-09 ENCOUNTER — Other Ambulatory Visit (HOSPITAL_COMMUNITY): Payer: Self-pay

## 2023-05-09 DIAGNOSIS — I6203 Nontraumatic chronic subdural hemorrhage: Secondary | ICD-10-CM | POA: Diagnosis not present

## 2023-05-09 LAB — GLUCOSE, CAPILLARY
Glucose-Capillary: 119 mg/dL — ABNORMAL HIGH (ref 70–99)
Glucose-Capillary: 117 mg/dL — ABNORMAL HIGH (ref 70–99)

## 2023-05-09 LAB — BASIC METABOLIC PANEL
Anion gap: 10 (ref 5–15)
BUN: 20 mg/dL (ref 8–23)
CO2: 23 mmol/L (ref 22–32)
Calcium: 9.5 mg/dL (ref 8.9–10.3)
Chloride: 96 mmol/L — ABNORMAL LOW (ref 98–111)
Creatinine, Ser: 1.07 mg/dL (ref 0.61–1.24)
GFR, Estimated: >60 mL/min (ref >60)
Glucose, Bld: 105 mg/dL — ABNORMAL HIGH (ref 70–99)
Potassium: 3.7 mmol/L (ref 3.5–5.1)
Sodium: 129 mmol/L — ABNORMAL LOW (ref 135–145)

## 2023-05-09 LAB — MAGNESIUM: Magnesium: 1.8 mg/dL (ref 1.7–2.4)

## 2023-05-09 MED ORDER — LACTATED RINGERS IV SOLN: INTRAVENOUS | Status: AC

## 2023-05-09 MED ORDER — FOLIC ACID 1 MG PO TABS
1.0000 mg | ORAL_TABLET | Freq: Every day | ORAL | 0 refills | Status: DC
  Filled 2023-05-09: qty 30, 30d supply, fill #0

## 2023-05-09 MED ORDER — THIAMINE HCL 100 MG PO TABS
100.0000 mg | ORAL_TABLET | Freq: Every day | ORAL | 0 refills | Status: DC
  Filled 2023-05-09: qty 30, 30d supply, fill #0
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
## 2023-05-09 NOTE — Plan of Care (Signed)

## 2023-05-09 NOTE — Progress Notes (Signed)
Mobility Specialist Progress Note;   05/09/23 1025  Mobility  Activity Ambulated with assistance in hallway  Level of Assistance Contact guard assist, steadying assist  Assistive Device Front wheel walker  Distance Ambulated (ft) 315 ft  Activity Response Tolerated well  Mobility Referral Yes  $Mobility charge 1 Mobility  Mobility Specialist Start Time (ACUTE ONLY) 1025  Mobility Specialist Stop Time (ACUTE ONLY) 1045  Mobility Specialist Time Calculation (min) (ACUTE ONLY) 20 min   Pt agreeable to mobility. Required minG assistance for STS and during ambulation. Ambulated the last 20ft in pts room w/o RW and pt was able to maintain. Stated "it felt good". VSS throughout and no c/o during session. Pt returned back to chair with all needs met.   Caesar Bookman Mobility Specialist Please contact via SecureChat or Rehab Office [phone]

## 2023-05-09 NOTE — Progress Notes (Signed)
Pt was hypotensive this morning, receiving IV fluids LR at 125cc/hr for 6 hours--fluids will be complete at 1230.  Pt states his ride is only 5 minutes away, he plans to call when gets closer to time for him to be able to go.  Discharge instructions reviewed with pt.  Copy of instructions given to pt, [facility] is filling 2 scripts for pt and will be picked up prior to pt leaving.  Anela Bensman,RN SWOT

## 2023-05-09 NOTE — TOC Transition Note (Addendum)
Transition of Care [facility]) - CM/SW Discharge Note   Patient Details  Name: Richard Andrews MRN: [id number] Date of Birth: 1960/09/26  Transition [facility] Care [facility]) CM/SW Contact:  Gordy Clement, RN Phone Number: 05/09/2023, 10:54 AM   Clinical Narrative:     Patient will DC to home today . Home Health PT/OT and aide will be provided by [facility]. Family to transport  AVS updated  Brother in Law to transport       Barriers to Discharge: Continued Medical Work up [facility])   Patient Goals and CMS Choice CMS Medicare.gov Compare Post Acute Care list provided to:: Patient Choice offered to / list presented to : Patient  Discharge Placement                         Discharge Plan and Services Additional resources added to the After Visit Summary for   In-house Referral: Clinical Social Work Discharge Planning Services: CM Consult Post Acute Care Choice: Home Health                               Social Determinants of Health (SDOH) Interventions SDOH Screenings   Food Insecurity: No Food Insecurity (05/03/2023)  Housing: Patient Declined (05/03/2023)  Transportation Needs: No Transportation Needs (05/03/2023)  Utilities: Not At Risk (05/03/2023)  Depression (PHQ2-9): Low Risk  (03/07/2023)  Tobacco Use: High Risk (05/03/2023)     Readmission Risk Interventions     No data to display

## 2023-05-10 ENCOUNTER — Telehealth: Payer: Self-pay

## 2023-05-10 NOTE — Transitions of Care (Post Inpatient/ED Visit) (Signed)
   05/10/2023  Name: Richard Andrews MRN: [id number] DOB: September 21, 1960  Today's TOC FU Call Status: Today's TOC FU Call Status:: Successful TOC FU Call Completed TOC FU Call Complete Date: 05/10/23 Patient's Name and Date of Birth confirmed.  Transition Care Management Follow-up Telephone Call Date of Discharge: 05/09/23 Discharge Facility: [name]) Type of Discharge: Inpatient Admission Primary Inpatient Discharge Diagnosis:: subdural hematoma How have you been since you were released from the hospital?: Better Any questions or concerns?: No  Items Reviewed: Did you receive and understand the discharge instructions provided?: Yes Medications obtained,verified, and reconciled?: Yes (Medications Reviewed) Any new allergies since your discharge?: No Dietary orders reviewed?: Yes Do you have support at home?: Yes People in Home: spouse  Medications Reviewed Today: Medications Reviewed Today     Reviewed by Karena Addison, LPN (Licensed Practical Nurse) on 05/10/23 at 1130  Med List Status: <None>   Medication Order Taking? Sig Documenting Provider Last Dose Status Informant  acetaminophen (TYLENOL) 325 MG tablet [id number] No Take 2 tablets (650 mg total) by mouth every 6 (six) hours as needed for mild pain (or Fever >/= 101). Maretta Bees, MD 05/02/2023 Active Self, Pharmacy Records  DULoxetine (CYMBALTA) 30 MG capsule [id number] No Take 1 capsule (30mg ) daily for one week then increase to 1 tablet (30mg ) twice daily.  Patient taking differently: Take 30 mg by mouth daily.   [name], NP Past Month Active Self, Pharmacy Records           Med Note Reuben Likes Mar 06, 2023  9:12 AM)    folic acid (FOLVITE) 1 MG tablet [id number]  Take 1 tablet (1 mg total) by mouth daily. Leroy Sea, MD  Active   pantoprazole (PROTONIX) 40 MG tablet [id number] No Take 1 tablet (40 mg total) by mouth daily. Charlton Amor, PA-C Past Month Active Self, Pharmacy Records            Med Note Reuben Likes Mar 06, 2023  9:13 AM)    thiamine (VITAMIN B1) 100 MG tablet [id number]  Take 1 tablet (100 mg total) by mouth daily. Leroy Sea, MD  Active             Home Care and Equipment/Supplies: Were Home Health Services Ordered?: NA Any new equipment or medical supplies ordered?: NA  Functional Questionnaire: Do you need assistance with bathing/showering or dressing?: No Do you need assistance with meal preparation?: No Do you need assistance with eating?: No Do you have difficulty maintaining continence: No Do you need assistance with getting out of bed/getting out of a chair/moving?: No Do you have difficulty managing or taking your medications?: No  Follow up appointments reviewed: PCP Follow-up appointment confirmed?: Yes Date of PCP follow-up appointment?: 05/25/23 Follow-up Provider: [facility] Follow-up appointment confirmed?: NA Do you need transportation to your follow-up appointment?: No Do you understand care options if your condition(s) worsen?: Yes-patient verbalized understanding    SIGNATURE Karena Addison, LPN [facility] Nurse Health Advisor Direct Dial [phone]

## 2023-05-25 ENCOUNTER — Ambulatory Visit (INDEPENDENT_AMBULATORY_CARE_PROVIDER_SITE_OTHER): Payer: BC Managed Care – PPO | Admitting: Medical-Surgical

## 2023-05-25 ENCOUNTER — Other Ambulatory Visit: Payer: Self-pay | Admitting: Medical-Surgical

## 2023-05-25 ENCOUNTER — Encounter: Payer: Self-pay | Admitting: Medical-Surgical

## 2023-05-25 VITALS — BP 134/87 | HR 74 | Resp 20 | Ht 70.0 in | Wt 126.1 lb

## 2023-05-25 DIAGNOSIS — F1020 Alcohol dependence, uncomplicated: Secondary | ICD-10-CM

## 2023-05-25 DIAGNOSIS — Z09 Encounter for follow-up examination after completed treatment for conditions other than malignant neoplasm: Secondary | ICD-10-CM

## 2023-05-25 DIAGNOSIS — E871 Hypo-osmolality and hyponatremia: Secondary | ICD-10-CM | POA: Diagnosis not present

## 2023-05-25 DIAGNOSIS — Z1211 Encounter for screening for malignant neoplasm of colon: Secondary | ICD-10-CM | POA: Diagnosis not present

## 2023-05-25 DIAGNOSIS — D696 Thrombocytopenia, unspecified: Secondary | ICD-10-CM

## 2023-05-25 NOTE — Progress Notes (Signed)
        Established patient visit  History, exam, impression, and plan:  1. Hospital discharge follow-up (Primary) Pleasant 62 year old male accompanied by his brother-in-law presenting today for hospital discharge follow-up.  He was evaluated on 05/03/2023 after his brother-in-law noted that he was not as active for the last 5 days.  He was evaluated in the ER and found to have acute on chronic subdural hematoma and transferred to [facility].  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  Today he reports that he is doing some better however he is only able to walk a few steps before having to rest.  He presents in a wheelchair during our appointment.  Patient is alert and oriented but has some memory lapses surrounding his most recent care.  On discharge, he was recommended to follow a 1.2 L fluid restriction due to hyponatremia.  He has not been following this instruction and does not remember education on such.  He was also discharged with instructions to start duloxetine but he is not taking this.  He is not currently taking any medications and reports he does not like taking pills.  Continues to drink alcohol on a daily basis and reports no intention of stopping.  Per discharge instructions, he is due for repeat of labs as well as a chest x-ray.  Discussed with him today and he is agreeable to doing so.  He has a follow-up with neurology as well as gastroenterology in place.  Checking CBC, CMP, and repeat chest x-ray today. - CBC with Differential/Platelet - CMP14+EGFR - DG Chest 2 View; Future  2. Alcohol use disorder, moderate, dependence (HCC) Reviewed alcohol use disorder.  He has been adamant that he will not stop drinking alcohol and at this point, has no plans to follow medical recommendations.  He is having difficulty walking due to lower extremity weakness.  This is likely related to thiamine deficiency.  I did review  that he should consider starting a thiamine supplement of 100 mg daily as prescribed as this may actually help with his independence and lower extremity strength.  He is agreeable to consider this.  3. Hyponatremia Hyponatremia during his hospitalization.  Concern for continued hyponatremia given that he is not following a fluid restriction.  Checking sodium level today. - CMP14+EGFR  4. Colon cancer screening Overdue for colon cancer screening.  He is agreeable to Cologuard so ordering today. - Cologuard   Procedures performed this visit: None.  Return if symptoms worsen or fail to improve.  __________________________________ Thayer Ohm, DNP, APRN, FNP-BC Primary Care and Sports Medicine [facility] [place]

## 2023-05-26 LAB — CMP14+EGFR
ALT: 17 [IU]/L (ref 0–44)
AST: 48 [IU]/L — ABNORMAL HIGH (ref 0–40)
Albumin: 3.9 g/dL (ref 3.9–4.9)
Alkaline Phosphatase: 158 [IU]/L — ABNORMAL HIGH (ref 44–121)
BUN/Creatinine Ratio: 13 (ref 10–24)
BUN: 11 mg/dL (ref 8–27)
Bilirubin Total: 0.9 mg/dL (ref 0.0–1.2)
CO2: 22 mmol/L (ref 20–29)
Calcium: 9.1 mg/dL (ref 8.6–10.2)
Chloride: 100 mmol/L (ref 96–106)
Creatinine, Ser: 0.82 mg/dL (ref 0.76–1.27)
Globulin, Total: 3.5 g/dL (ref 1.5–4.5)
Glucose: 93 mg/dL (ref 70–99)
Potassium: 3.8 mmol/L (ref 3.5–5.2)
Sodium: 136 mmol/L (ref 134–144)
Total Protein: 7.4 g/dL (ref 6.0–8.5)
eGFR: 99 mL/min/{1.73_m2} (ref >59)

## 2023-05-26 LAB — CBC WITH DIFFERENTIAL/PLATELET
Basophils Absolute: 0 10*3/uL (ref 0.0–0.2)
Basos: 0 %
EOS (ABSOLUTE): 0.1 10*3/uL (ref 0.0–0.4)
Eos: 2 %
Hematocrit: 31.5 % — ABNORMAL LOW (ref 37.5–51.0)
Hemoglobin: 10.7 g/dL — ABNORMAL LOW (ref 13.0–17.7)
Immature Grans (Abs): 0 10*3/uL (ref 0.0–0.1)
Immature Granulocytes: 0 %
Lymphocytes Absolute: 0.7 10*3/uL (ref 0.7–3.1)
Lymphs: 22 %
MCH: 33.9 pg — ABNORMAL HIGH (ref 26.6–33.0)
MCHC: 34 g/dL (ref 31.5–35.7)
MCV: 100 fL — ABNORMAL HIGH (ref 79–97)
Monocytes Absolute: 0.4 10*3/uL (ref 0.1–0.9)
Monocytes: 14 %
Neutrophils Absolute: 1.8 10*3/uL (ref 1.4–7.0)
Neutrophils: 62 %
Platelets: 56 10*3/uL — CL (ref 150–450)
RBC: 3.16 x10E6/uL — ABNORMAL LOW (ref 4.14–5.80)
RDW: 14.2 % (ref 11.6–15.4)
WBC: 3 10*3/uL — ABNORMAL LOW (ref 3.4–10.8)

## 2023-05-26 NOTE — Addendum Note (Signed)
Addended byChristen Butter on: 05/26/2023 05:28 PM   Modules accepted: Orders

## 2023-06-05 ENCOUNTER — Encounter: Payer: Self-pay | Admitting: Hematology & Oncology

## 2023-07-05 ENCOUNTER — Emergency Department (HOSPITAL_COMMUNITY): Payer: BC Managed Care – PPO

## 2023-07-05 ENCOUNTER — Other Ambulatory Visit: Payer: Self-pay

## 2023-07-05 ENCOUNTER — Encounter (HOSPITAL_COMMUNITY): Payer: Self-pay | Admitting: Emergency Medicine

## 2023-07-05 ENCOUNTER — Inpatient Hospital Stay (HOSPITAL_COMMUNITY)
Admission: EM | Admit: 2023-07-05 | Discharge: 2023-07-15 | DRG: 640 | Disposition: A | Discharge status: Skilled Nursing Facility | Payer: BC Managed Care – PPO | Attending: Internal Medicine | Admitting: Internal Medicine

## 2023-07-05 DIAGNOSIS — E860 Dehydration: Secondary | ICD-10-CM | POA: Diagnosis present

## 2023-07-05 DIAGNOSIS — S2239XA Fracture of one rib, unspecified side, initial encounter for closed fracture: Secondary | ICD-10-CM | POA: Insufficient documentation

## 2023-07-05 DIAGNOSIS — E876 Hypokalemia: Principal | ICD-10-CM

## 2023-07-05 DIAGNOSIS — S065XAD Traumatic subdural hemorrhage with loss of consciousness status unknown, subsequent encounter: Secondary | ICD-10-CM | POA: Diagnosis not present

## 2023-07-05 DIAGNOSIS — F1010 Alcohol abuse, uncomplicated: Secondary | ICD-10-CM

## 2023-07-05 DIAGNOSIS — F17210 Nicotine dependence, cigarettes, uncomplicated: Secondary | ICD-10-CM | POA: Diagnosis present

## 2023-07-05 DIAGNOSIS — D61818 Other pancytopenia: Secondary | ICD-10-CM | POA: Diagnosis present

## 2023-07-05 DIAGNOSIS — D696 Thrombocytopenia, unspecified: Secondary | ICD-10-CM

## 2023-07-05 DIAGNOSIS — D638 Anemia in other chronic diseases classified elsewhere: Secondary | ICD-10-CM | POA: Diagnosis present

## 2023-07-05 DIAGNOSIS — E43 Unspecified severe protein-calorie malnutrition: Secondary | ICD-10-CM | POA: Diagnosis present

## 2023-07-05 DIAGNOSIS — E871 Hypo-osmolality and hyponatremia: Principal | ICD-10-CM

## 2023-07-05 DIAGNOSIS — D649 Anemia, unspecified: Secondary | ICD-10-CM

## 2023-07-05 DIAGNOSIS — K7460 Unspecified cirrhosis of liver: Secondary | ICD-10-CM

## 2023-07-05 DIAGNOSIS — Z8249 Family history of ischemic heart disease and other diseases of the circulatory system: Secondary | ICD-10-CM

## 2023-07-05 DIAGNOSIS — Z681 Body mass index (BMI) 19 or less, adult: Secondary | ICD-10-CM

## 2023-07-05 DIAGNOSIS — D539 Nutritional anemia, unspecified: Secondary | ICD-10-CM | POA: Diagnosis present

## 2023-07-05 DIAGNOSIS — R410 Disorientation, unspecified: Secondary | ICD-10-CM | POA: Insufficient documentation

## 2023-07-05 DIAGNOSIS — T730XXA Starvation, initial encounter: Secondary | ICD-10-CM | POA: Diagnosis present

## 2023-07-05 DIAGNOSIS — F1020 Alcohol dependence, uncomplicated: Secondary | ICD-10-CM | POA: Diagnosis present

## 2023-07-05 DIAGNOSIS — Z66 Do not resuscitate: Secondary | ICD-10-CM | POA: Diagnosis present

## 2023-07-05 DIAGNOSIS — W06XXXA Fall from bed, initial encounter: Secondary | ICD-10-CM | POA: Diagnosis present

## 2023-07-05 DIAGNOSIS — E222 Syndrome of inappropriate secretion of antidiuretic hormone: Secondary | ICD-10-CM | POA: Diagnosis present

## 2023-07-05 DIAGNOSIS — I10 Essential (primary) hypertension: Secondary | ICD-10-CM | POA: Diagnosis present

## 2023-07-05 DIAGNOSIS — K7030 Alcoholic cirrhosis of liver without ascites: Secondary | ICD-10-CM | POA: Diagnosis present

## 2023-07-05 DIAGNOSIS — R7881 Bacteremia: Secondary | ICD-10-CM | POA: Diagnosis present

## 2023-07-05 DIAGNOSIS — E8729 Other acidosis: Secondary | ICD-10-CM | POA: Diagnosis present

## 2023-07-05 DIAGNOSIS — I6203 Nontraumatic chronic subdural hemorrhage: Secondary | ICD-10-CM | POA: Diagnosis present

## 2023-07-05 DIAGNOSIS — R5381 Other malaise: Secondary | ICD-10-CM | POA: Diagnosis present

## 2023-07-05 DIAGNOSIS — R509 Fever, unspecified: Secondary | ICD-10-CM | POA: Diagnosis present

## 2023-07-05 DIAGNOSIS — K7682 Hepatic encephalopathy: Secondary | ICD-10-CM | POA: Diagnosis present

## 2023-07-05 DIAGNOSIS — Z1152 Encounter for screening for COVID-19: Secondary | ICD-10-CM

## 2023-07-05 DIAGNOSIS — S2231XA Fracture of one rib, right side, initial encounter for closed fracture: Secondary | ICD-10-CM | POA: Diagnosis present

## 2023-07-05 DIAGNOSIS — R531 Weakness: Secondary | ICD-10-CM

## 2023-07-05 LAB — CBC WITH DIFFERENTIAL/PLATELET
Abs Immature Granulocytes: 0.04 10*3/uL (ref 0.00–0.07)
Basophils Absolute: 0 10*3/uL (ref 0.0–0.1)
Basophils Relative: 0 %
Eosinophils Absolute: 0 10*3/uL (ref 0.0–0.5)
Eosinophils Relative: 0 %
HCT: 30.7 % — ABNORMAL LOW (ref 39.0–52.0)
Hemoglobin: 10.5 g/dL — ABNORMAL LOW (ref 13.0–17.0)
Immature Granulocytes: 1 %
Lymphocytes Relative: 13 %
Lymphs Abs: 0.6 10*3/uL — ABNORMAL LOW (ref 0.7–4.0)
MCH: 34.1 pg — ABNORMAL HIGH (ref 26.0–34.0)
MCHC: 34.2 g/dL (ref 30.0–36.0)
MCV: 99.7 fL (ref 80.0–100.0)
Monocytes Absolute: 0.4 10*3/uL (ref 0.1–1.0)
Monocytes Relative: 8 %
Neutro Abs: 3.7 10*3/uL (ref 1.7–7.7)
Neutrophils Relative %: 78 %
Platelets: 46 10*3/uL — ABNORMAL LOW (ref 150–400)
RBC: 3.08 MIL/uL — ABNORMAL LOW (ref 4.22–5.81)
RDW: 14.5 % (ref 11.5–15.5)
WBC: 4.7 10*3/uL (ref 4.0–10.5)
nRBC: 0 % (ref 0.0–0.2)

## 2023-07-05 LAB — URINALYSIS, W/ REFLEX TO CULTURE (INFECTION SUSPECTED)
Bacteria, UA: NONE SEEN
Glucose, UA: NEGATIVE mg/dL
Hgb urine dipstick: NEGATIVE
Ketones, ur: 5 mg/dL — AB
Leukocytes,Ua: NEGATIVE
Nitrite: NEGATIVE
Protein, ur: 30 mg/dL — AB
Specific Gravity, Urine: 1.026 (ref 1.005–1.030)
pH: 6 (ref 5.0–8.0)

## 2023-07-05 LAB — COMPREHENSIVE METABOLIC PANEL
ALT: 21 U/L (ref 0–44)
AST: 72 U/L — ABNORMAL HIGH (ref 15–41)
Albumin: 3.5 g/dL (ref 3.5–5.0)
Alkaline Phosphatase: 84 U/L (ref 38–126)
Anion gap: 20 — ABNORMAL HIGH (ref 5–15)
BUN: 14 mg/dL (ref 8–23)
CO2: 23 mmol/L (ref 22–32)
Calcium: 9.1 mg/dL (ref 8.9–10.3)
Chloride: 86 mmol/L — ABNORMAL LOW (ref 98–111)
Creatinine, Ser: 0.61 mg/dL (ref 0.61–1.24)
GFR, Estimated: >60 mL/min (ref >60)
Glucose, Bld: 87 mg/dL (ref 70–99)
Potassium: 2.8 mmol/L — ABNORMAL LOW (ref 3.5–5.1)
Sodium: 129 mmol/L — ABNORMAL LOW (ref 135–145)
Total Bilirubin: 3.9 mg/dL — ABNORMAL HIGH (ref 0.0–1.2)
Total Protein: 7.3 g/dL (ref 6.5–8.1)

## 2023-07-05 LAB — PHOSPHORUS
Phosphorus: 1.5 mg/dL — ABNORMAL LOW (ref 2.5–4.6)
Phosphorus: <1 mg/dL — CL (ref 2.5–4.6)

## 2023-07-05 LAB — PROTIME-INR
INR: 1.2 (ref 0.8–1.2)
Prothrombin Time: 15.7 s — ABNORMAL HIGH (ref 11.4–15.2)

## 2023-07-05 LAB — MAGNESIUM
Magnesium: 1.3 mg/dL — ABNORMAL LOW (ref 1.7–2.4)
Magnesium: 1.6 mg/dL — ABNORMAL LOW (ref 1.7–2.4)

## 2023-07-05 LAB — BLOOD GAS, VENOUS
Acid-Base Excess: 3.5 mmol/L — ABNORMAL HIGH (ref 0.0–2.0)
Bicarbonate: 27.8 mmol/L (ref 20.0–28.0)
O2 Saturation: 34.5 %
Patient temperature: 37
pCO2, Ven: 40 mm[Hg] — ABNORMAL LOW (ref 44–60)
pH, Ven: 7.45 — ABNORMAL HIGH (ref 7.25–7.43)
pO2, Ven: <31 mm[Hg] — CL (ref 32–45)

## 2023-07-05 LAB — RAPID URINE DRUG SCREEN, HOSP PERFORMED
Amphetamines: NOT DETECTED
Barbiturates: NOT DETECTED
Benzodiazepines: POSITIVE — AB
Cocaine: NOT DETECTED
Opiates: NOT DETECTED
Tetrahydrocannabinol: NOT DETECTED

## 2023-07-05 LAB — AMMONIA: Ammonia: 54 umol/L — ABNORMAL HIGH (ref 9–35)

## 2023-07-05 LAB — SODIUM: Sodium: 127 mmol/L — ABNORMAL LOW (ref 135–145)

## 2023-07-05 LAB — CK: Total CK: 165 U/L (ref 49–397)

## 2023-07-05 LAB — TROPONIN I (HIGH SENSITIVITY)
Troponin I (High Sensitivity): 5 ng/L (ref <18)
Troponin I (High Sensitivity): 5 ng/L (ref <18)

## 2023-07-05 LAB — LIPASE, BLOOD: Lipase: 299 U/L — ABNORMAL HIGH (ref 11–51)

## 2023-07-05 LAB — ETHANOL: Alcohol, Ethyl (B): <10 mg/dL (ref <10)

## 2023-07-05 MED ORDER — LORAZEPAM 2 MG/ML IJ SOLN: 1.0000 mg | INTRAMUSCULAR | Status: DC | PRN

## 2023-07-05 MED ORDER — FOLIC ACID 1 MG PO TABS
1.0000 mg | ORAL_TABLET | Freq: Every day | ORAL | Status: DC
Start: 2023-07-05 — End: 2023-07-15
  Administered 2023-07-05 – 2023-07-15 (×11): 1 mg via ORAL
  Filled 2023-07-05 (×11): qty 1

## 2023-07-05 MED ORDER — THIAMINE HCL 100 MG/ML IJ SOLN: 100.0000 mg | Freq: Every day | INTRAMUSCULAR | Status: DC

## 2023-07-05 MED ORDER — LORAZEPAM 1 MG PO TABS: 1.0000 mg | ORAL_TABLET | ORAL | Status: DC | PRN

## 2023-07-05 MED ORDER — K PHOS MONO-SOD PHOS DI & MONO 155-852-130 MG PO TABS
500.0000 mg | ORAL_TABLET | ORAL | Status: AC
  Administered 2023-07-05: 500 mg via ORAL
  Filled 2023-07-05 (×3): qty 2

## 2023-07-05 MED ORDER — MAGNESIUM OXIDE -MG SUPPLEMENT 400 (240 MG) MG PO TABS
400.0000 mg | ORAL_TABLET | Freq: Every day | ORAL | Status: DC
  Administered 2023-07-05 – 2023-07-15 (×11): 400 mg via ORAL
  Filled 2023-07-05 (×12): qty 1

## 2023-07-05 MED ORDER — MAGNESIUM SULFATE 2 GM/50ML IV SOLN
2.0000 g | Freq: Once | INTRAVENOUS | Status: AC
  Administered 2023-07-05: 2 g via INTRAVENOUS
  Filled 2023-07-05: qty 50

## 2023-07-05 MED ORDER — SODIUM CHLORIDE 0.9 % IV SOLN: Freq: Once | INTRAVENOUS | Status: AC

## 2023-07-05 MED ORDER — ADULT MULTIVITAMIN W/MINERALS CH
1.0000 | ORAL_TABLET | Freq: Every day | ORAL | Status: DC
  Administered 2023-07-05 – 2023-07-15 (×11): 1 via ORAL
  Filled 2023-07-05 (×11): qty 1

## 2023-07-05 MED ORDER — MAGNESIUM SULFATE 2 GM/50ML IV SOLN
2.0000 g | Freq: Once | INTRAVENOUS | Status: AC
  Administered 2023-07-06: 2 g via INTRAVENOUS
  Filled 2023-07-05: qty 50

## 2023-07-05 MED ORDER — MAGNESIUM SULFATE 2 GM/50ML IV SOLN: 2.0000 g | Freq: Once | INTRAVENOUS | Status: DC

## 2023-07-05 MED ORDER — POTASSIUM CHLORIDE CRYS ER 20 MEQ PO TBCR
40.0000 meq | EXTENDED_RELEASE_TABLET | Freq: Once | ORAL | Status: AC
  Administered 2023-07-05: 40 meq via ORAL
  Filled 2023-07-05: qty 2

## 2023-07-05 MED ORDER — POTASSIUM CHLORIDE 10 MEQ/100ML IV SOLN
10.0000 meq | INTRAVENOUS | Status: AC
  Administered 2023-07-05 (×3): 10 meq via INTRAVENOUS
  Filled 2023-07-05 (×3): qty 100

## 2023-07-05 MED ORDER — THIAMINE MONONITRATE 100 MG PO TABS
100.0000 mg | ORAL_TABLET | Freq: Every day | ORAL | Status: DC
Start: 2023-07-05 — End: 2023-07-15
  Administered 2023-07-05 – 2023-07-15 (×11): 100 mg via ORAL
  Filled 2023-07-05 (×11): qty 1

## 2023-07-05 MED ORDER — SODIUM CHLORIDE 0.9 % IV SOLN
45.0000 mmol | Freq: Once | INTRAVENOUS | Status: AC
  Administered 2023-07-06: 45 mmol via INTRAVENOUS
  Filled 2023-07-05: qty 15

## 2023-07-05 MED ORDER — ACETAMINOPHEN 325 MG PO TABS
650.0000 mg | ORAL_TABLET | Freq: Once | ORAL | Status: AC
  Administered 2023-07-05: 650 mg via ORAL
  Filled 2023-07-05: qty 2

## 2023-07-05 MED ORDER — ENSURE ENLIVE PO LIQD
237.0000 mL | Freq: Two times a day (BID) | ORAL | Status: DC
Start: 2023-07-06 — End: 2023-07-06
  Administered 2023-07-06: 237 mL via ORAL

## 2023-07-05 NOTE — ED Triage Notes (Signed)
Pt BIB [facility] from home, family called out after well check. Per EMS, pt sitting in feces with feet dangling off side of bed for unknown amount of time. Empty liquor bottles found throughout home. Pt stopped taking all prescribed medications.   BP 122 palpated P 36 RR 18 SpO2 97% CBG 132

## 2023-07-05 NOTE — ED Notes (Signed)
ED TO INPATIENT HANDOFF REPORT  Name/Age/Gender Richard Andrews 63 y.o. male  Code Status Code Status History     Date Active Date Inactive Code Status Order ID Comments User Context   05/03/2023 0845 05/09/2023 1848 Full Code [id number]  Maryln Gottron, MD ED   01/24/2023 1327 02/06/2023 1907 DNR [id number]  Elgergawy, Leana Roe, MD Inpatient   01/23/2023 0038 01/24/2023 1327 Full Code [id number]  Andris Baumann, MD ED   11/21/2022 1727 11/29/2022 1530 Full Code [id number]  Charlton Amor, PA-C Inpatient   11/21/2022 1727 11/21/2022 1727 Full Code [id number]  Charlton Amor, PA-C Inpatient   11/15/2022 1828 11/21/2022 1726 Full Code [id number]  Diamantina Monks, MD Inpatient   10/07/2022 1657 10/22/2022 1834 Full Code [id number]  Milinda Antis, PA-C Inpatient   10/07/2022 1657 10/07/2022 1657 Full Code [id number]  Milinda Antis, PA-C Inpatient   09/24/2022 1808 10/07/2022 1559 Full Code [id number]  Gaynelle Adu, MD ED   12/11/2021 1632 12/12/2021 0343 Full Code [id number]  Vanetta Mulders, MD ED   01/19/2021 1705 01/25/2021 1929 Full Code [id number]  Drema Dallas, MD Inpatient   01/11/2020 0002 01/13/2020 1610 Full Code [id number]  Dominica Severin, MD Inpatient    Questions for Most Recent Historical Code Status (Order [id number])     Question Answer   By: Consent: discussion documented in EHR            Home/SNF/Other Home  Chief Complaint Hypokalemia [E87.6]  Level of Care/Admitting Diagnosis ED Disposition     ED Disposition  Admit   Condition  --   Comment  Hospital Area: [facility] [[id number]]  Level of Care: Telemetry [5]  Admit to tele based on following criteria: Other see comments  Comments: severe electrolyte abnormality  [facility] patient in [facility] at [facility] or [facility] if equivalent level of care is available:: Yes  Covid Evaluation: Asymptomatic - no recent exposure (last 10 days) testing not required  Diagnosis: Hypokalemia [616073]   Admitting Physician: Alberteen Sam [[id number]]  Attending Physician: Alberteen Sam [[id number]]          Medical History Past Medical History:  Diagnosis Date   Allergy    Arthritis    Distal radius fracture, left    Hepatic steatosis    Hypertension    Kidney stone 02/16/2023   Substance abuse (HCC)     Allergies No Known Allergies  IV Location/Drains/Wounds Patient Lines/Drains/Airways Status     Active Line/Drains/Airways     Name Placement date Placement time Site Days   Peripheral IV 07/05/23 20 G Left Antecubital 07/05/23  1554  Antecubital  less than 1   Ureteral Drain/Stent Right ureter 6 Fr. 03/21/23  1000  Right ureter  106   Ureteral Drain/Stent Left ureter 6 Fr. 03/21/23  0958  Left ureter  106   Wound / Incision (Open or Dehisced) 11/15/22 Other (Comment) Arm Left;Lower;Posterior Abrasion 11/15/22  1900  Arm  232   Wound / Incision (Open or Dehisced) 01/23/23 Irritant Dermatitis (Moisture Associated Skin Damage) Buttocks Posterior;Bilateral excoriation 01/23/23  1630  Buttocks  163   Wound / Incision (Open or Dehisced) 01/23/23 Irritant Dermatitis (Moisture Associated Skin Damage) Scrotum excoriation 01/23/23  1630  Scrotum  163   Wound / Incision (Open or Dehisced) 01/23/23 Non-pressure wound Thigh Anterior;Left black scabbed over spot, may be healing wound or a large mole 01/23/23  1630  Thigh  163  Labs/Imaging Results for orders placed or performed during the hospital encounter of 07/05/23 (from the past 48 hours)  Ammonia     Status: Abnormal   Collection Time: 07/05/23  2:24 PM  Result Value Ref Range   Ammonia 54 (H) 9 - 35 umol/L    Comment: Performed at [facility], [address], [place], [name] [address]  Blood gas, venous (at [facility])     Status: Abnormal   Collection Time: 07/05/23  2:25 PM  Result Value Ref Range   pH, Ven 7.45 (H) 7.25 - 7.43   pCO2, Ven 40 (L) 44 - 60 mmHg   pO2, Ven <31  (LL) 32 - 45 mmHg    Comment: CRITICAL RESULT CALLED TO, READ BACK BY AND VERIFIED WITH: Concepcion Living, RN [phone] 07/05/23 BY VIN    Bicarbonate 27.8 20.0 - 28.0 mmol/L   Acid-Base Excess 3.5 (H) 0.0 - 2.0 mmol/L   O2 Saturation 34.5 %   Patient temperature 37.0     Comment: Performed at [facility], [address], [place], [name] [address]  CBC with Differential     Status: Abnormal   Collection Time: 07/05/23  2:26 PM  Result Value Ref Range   WBC 4.7 4.0 - 10.5 K/uL   RBC 3.08 (L) 4.22 - 5.81 MIL/uL   Hemoglobin 10.5 (L) 13.0 - 17.0 g/dL   HCT 84.6 (L) 96.2 - 95.2 %   MCV 99.7 80.0 - 100.0 fL   MCH 34.1 (H) 26.0 - 34.0 pg   MCHC 34.2 30.0 - 36.0 g/dL   RDW 84.1 32.4 - 40.1 %   Platelets 46 (L) 150 - 400 K/uL    Comment: SPECIMEN CHECKED FOR CLOTS Immature Platelet Fraction may be clinically indicated, consider ordering this additional test [id number] REPEATED TO VERIFY PLATELET COUNT CONFIRMED BY SMEAR    nRBC 0.0 0.0 - 0.2 %   Neutrophils Relative % 78 %   Neutro Abs 3.7 1.7 - 7.7 K/uL   Lymphocytes Relative 13 %   Lymphs Abs 0.6 (L) 0.7 - 4.0 K/uL   Monocytes Relative 8 %   Monocytes Absolute 0.4 0.1 - 1.0 K/uL   Eosinophils Relative 0 %   Eosinophils Absolute 0.0 0.0 - 0.5 K/uL   Basophils Relative 0 %   Basophils Absolute 0.0 0.0 - 0.1 K/uL   Immature Granulocytes 1 %   Abs Immature Granulocytes 0.04 0.00 - 0.07 K/uL    Comment: Performed at [facility], [address], [place], [name] [address]  Comprehensive metabolic panel     Status: Abnormal   Collection Time: 07/05/23  2:26 PM  Result Value Ref Range   Sodium 129 (L) 135 - 145 mmol/L   Potassium 2.8 (L) 3.5 - 5.1 mmol/L   Chloride 86 (L) 98 - 111 mmol/L   CO2 23 22 - 32 mmol/L   Glucose, Bld 87 70 - 99 mg/dL    Comment: Glucose reference range applies only to samples taken after fasting for at least 8 hours.   BUN 14 8 - 23 mg/dL   Creatinine, Ser 7.42 0.61 - 1.24  mg/dL   Calcium 9.1 8.9 - 59.5 mg/dL   Total Protein 7.3 6.5 - 8.1 g/dL   Albumin 3.5 3.5 - 5.0 g/dL   AST 72 (H) 15 - 41 U/L   ALT 21 0 - 44 U/L   Alkaline Phosphatase 84 38 - 126 U/L   Total Bilirubin 3.9 (H)  0.0 - 1.2 mg/dL   GFR, Estimated >04 >54 mL/min    Comment: (NOTE) Calculated using the CKD-EPI Creatinine Equation (2021)    Anion gap 20 (H) 5 - 15    Comment: Performed at [facility], [address], [place], [name] [address]  Lipase, blood     Status: Abnormal   Collection Time: 07/05/23  2:26 PM  Result Value Ref Range   Lipase 299 (H) 11 - 51 U/L    Comment: Performed at [facility], [address], [place], [name] [address]  Troponin I (High Sensitivity)     Status: None   Collection Time: 07/05/23  2:26 PM  Result Value Ref Range   Troponin I (High Sensitivity) 5 <18 ng/L    Comment: (NOTE) Elevated high sensitivity troponin I (hsTnI) values and significant  changes across serial measurements may suggest ACS but many other  chronic and acute conditions are known to elevate hsTnI results.  Refer to the "Links" section for chest pain algorithms and additional  guidance. Performed at [facility], [address], [place], [name] [address]   Protime-INR     Status: Abnormal   Collection Time: 07/05/23  2:26 PM  Result Value Ref Range   Prothrombin Time 15.7 (H) 11.4 - 15.2 seconds   INR 1.2 0.8 - 1.2    Comment: (NOTE) INR goal varies based on device and disease states. Performed at [facility], [address] [place], [name] [address]   Magnesium     Status: Abnormal   Collection Time: 07/05/23  2:26 PM  Result Value Ref Range   Magnesium 1.3 (L) 1.7 - 2.4 mg/dL    Comment: Performed at [facility], [address], [place], [name] [address]  CK     Status: None   Collection Time: 07/05/23  2:26 PM  Result Value Ref Range   Total CK 165 49 - 397 U/L     Comment: Performed at [facility], [address], [place], [name] [address]  Phosphorus     Status: Abnormal   Collection Time: 07/05/23  2:26 PM  Result Value Ref Range   Phosphorus 1.5 (L) 2.5 - 4.6 mg/dL    Comment: Performed at [facility], [address] [place], [name] [address]  Troponin I (High Sensitivity)     Status: None   Collection Time: 07/05/23  2:26 PM  Result Value Ref Range   Troponin I (High Sensitivity) 5 <18 ng/L    Comment: (NOTE) Elevated high sensitivity troponin I (hsTnI) values and significant  changes across serial measurements may suggest ACS but many other  chronic and acute conditions are known to elevate hsTnI results.  Refer to the "Links" section for chest pain algorithms and additional  guidance. Performed at [facility], [address], [place], [name] [address]   Ethanol     Status: None   Collection Time: 07/05/23  2:27 PM  Result Value Ref Range   Alcohol, Ethyl (B) <10 <10 mg/dL    Comment: (NOTE) Lowest detectable limit for serum alcohol is 10 mg/dL.  For medical purposes only. Performed at [facility], [address] [place], [name] [address]    CT Head Wo Contrast Result Date: 07/05/2023 CLINICAL DATA:  Altered mental status. EXAM: CT HEAD WITHOUT CONTRAST TECHNIQUE: Contiguous axial images were obtained from the base of the skull through the vertex without intravenous contrast.  RADIATION DOSE REDUCTION: This exam was performed according to the departmental dose-optimization program which includes automated exposure control, adjustment of the mA and/or kV according to patient size and/or use of iterative reconstruction technique. COMPARISON:  Head CT dated 05/04/2023. FINDINGS: Brain: Moderate age-related atrophy and chronic microvascular ischemic changes. Right hemispheric mixed density subdural collection measures approximately 5 mm in thickness and  appears similar or slightly decreased in size compared to prior CT. Stable arachnoid cyst in the left middle cranial fossa. No midline shift. Vascular: No hyperdense vessel or unexpected calcification. Similar appearance of post procedural changes from prior embolization of right middle meningeal artery. Skull: Normal. Negative for fracture or focal lesion. Sinuses/Orbits: Partially visualized left maxillary sinus retention cyst or polyp. No air-fluid level. Other: None IMPRESSION: 1. Relatively similar mixed density right frontal convexity subdural collection. 2. Moderate age-related atrophy and chronic microvascular ischemic changes. Electronically Signed   By: Elgie Collard M.D.   On: 07/05/2023 14:11    Pending Labs Unresulted Labs (From admission, onward)     Start     Ordered   07/06/23 0500  Magnesium  Tomorrow morning,   R        07/05/23 1640   07/05/23 1250  Urinalysis, Routine w reflex microscopic -Urine, Clean Catch  Once,   URGENT       Question:  Specimen Source  Answer:  Urine, Clean Catch   07/05/23 1249   07/05/23 1250  Rapid urine drug screen (hospital performed)  ONCE - STAT,   STAT        07/05/23 1249            Vitals/Pain Today's Vitals   07/05/23 1127 07/05/23 1415 07/05/23 1545  BP:  115/84 122/83  Pulse:  100 97  Resp:   16  Temp:  98.3 F (36.8 C)   TempSrc:  Oral   SpO2:  100% 100%  PainSc: 0-No pain      Isolation Precautions No active isolations  Medications Medications  potassium chloride 10 mEq in 100 mL IVPB (10 mEq Intravenous New Bag/Given 07/05/23 1625)  magnesium oxide (MAG-OX) tablet 400 mg (400 mg Oral Given 07/05/23 1626)  phosphorus (K PHOS NEUTRAL) tablet 500 mg (has no administration in time range)  magnesium sulfate IVPB 2 g 50 mL (has no administration in time range)  acetaminophen (TYLENOL) tablet 650 mg (650 mg Oral Given 07/05/23 1527)  0.9 %  sodium chloride infusion ( Intravenous New Bag/Given 07/05/23 1626)  potassium  chloride SA (KLOR-CON M) CR tablet 40 mEq (40 mEq Oral Given 07/05/23 1626)    Mobility non-ambulatory

## 2023-07-05 NOTE — H&P (Signed)
History and Physical    Patient: Richard Andrews QIO:[id number] DOB: Oct 30, 1960 DOA: 07/05/2023 DOS: the patient was seen and examined on 07/05/2023 PCP: Richard Butter, NP  Patient coming from: Home  Chief Complaint:  Chief Complaint  Patient presents with   Weakness       HPI:  Richard Andrews is a 63 y.o. 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  Patient is a limited historian.  Spoke to wife by phone who reports that the patient was able to walk short distances when he got out of the hospital in November, but starting about 3 weeks ago, he has been too weak to get out of bed.  Finally she noticed he had a bedsore, his legs were swollen, and she and his roommate could not lift him, so they called EMS.  In the ER, found to have sodium 129, potassium 2.8, phosphorus 1.5, magnesium 1.3, platelets 46, metabolic acidosis.  CT head showed no new bleed.      Review of Systems  Constitutional:  Positive for malaise/fatigue and weight loss. Negative for chills and fever.  Respiratory:  Negative for cough, hemoptysis, sputum production, shortness of breath and wheezing.   Cardiovascular:  Positive for leg swelling. Negative for chest pain.  Gastrointestinal:  Negative for diarrhea, nausea and vomiting.  Neurological:  Positive for weakness. Negative for sensory change, speech change, focal weakness, seizures and loss of consciousness.  Psychiatric/Behavioral:  Positive for substance abuse.   All other systems reviewed and are negative.    Past Medical History:  Diagnosis Date   Allergy    Arthritis    Distal radius fracture, left    Hepatic steatosis    Hypertension    Kidney stone 02/16/2023   Substance abuse [facility])    Past Surgical History:   Procedure Laterality Date   BIOPSY  01/22/2021   Procedure: BIOPSY;  Surgeon: Shellia Cleverly, DO;  Location: [facility];  Service: Gastroenterology;;   CYSTOSCOPY W/ URETERAL STENT PLACEMENT Bilateral 01/23/2023   Procedure: 1. Cystoscopy 2. Bilateral  retrograde pyelogram with interpretation 3. bilateral ureteral stent placement (6x24 cm on the right, 6x26 cm on the left) 4. Fluoroscopy <1 hour with intraoperative interpretation;  Surgeon: Despina Arias, MD;  Location: [facility] OR;  Service: Urology;  Laterality: Bilateral;   CYSTOSCOPY/URETEROSCOPY/HOLMIUM LASER/STENT PLACEMENT Bilateral 03/21/2023   Procedure: BILATERAL URETEROSCOPY/HOLMIUM LASER/STENT PLACEMENT;  Surgeon: Despina Arias, MD;  Location: [facility];  Service: Urology;  Laterality: Bilateral;   ESOPHAGOGASTRODUODENOSCOPY (EGD) WITH PROPOFOL N/A 01/22/2021   Procedure: ESOPHAGOGASTRODUODENOSCOPY (EGD) WITH PROPOFOL;  Surgeon: Shellia Cleverly, DO;  Location: [facility];  Service: Gastroenterology;  Laterality: N/A;   I & D EXTREMITY Right 01/10/2020   Procedure: ORIF RIGHT WRIST;  Surgeon: Dominica Severin, MD;  Location: [facility] OR;  Service: Orthopedics;  Laterality: Right;   IR ANGIO EXTERNAL CAROTID SEL EXT CAROTID UNI R MOD SED  10/04/2022   IR ANGIO INTRA EXTRACRAN SEL INTERNAL CAROTID UNI R MOD SED  09/30/2022   IR ANGIOGRAM FOLLOW UP STUDY  09/30/2022   IR NEURO EACH ADD'L AFTER BASIC UNI RIGHT (MS)  10/04/2022   IR TRANSCATH/EMBOLIZ  09/30/2022   OPEN REDUCTION INTERNAL FIXATION (ORIF) DISTAL RADIAL FRACTURE Left 01/28/2015   Procedure: OPEN REDUCTION INTERNAL FIXATION (ORIF)  LEFT DISTAL RADIAL FRACTURE;  Surgeon: Tarry Kos, MD;  Location: [facility];  Service: Orthopedics;  Laterality: Left;   ORIF HUMERUS FRACTURE Right 09/27/2022   Procedure: OPEN REDUCTION INTERNAL FIXATION (ORIF) PROXIMAL HUMERUS FRACTURE;  Surgeon: Teryl Lucy, MD;  Location: [facility];  Service: Orthopedics;  Laterality: Right;   RADIOLOGY  WITH ANESTHESIA N/A 09/30/2022   Procedure: IR WITH ANESTHESIA MMA EMBOLIZATION;  Surgeon: Radiologist, Medication, MD;  Location: [facility] OR;  Service: Radiology;  Laterality: N/A;   TONSILLECTOMY     Social History:  reports that he has been smoking cigarettes. He has a 10 pack-year smoking history. His smokeless tobacco use includes chew. He reports current alcohol use of about 16.0 - 20.0 standard drinks of alcohol per week. He reports that he does not use drugs.  No Known Allergies  Family History  Problem Relation Age of Onset   Heart disease Mother    Hyperlipidemia Mother    Skin cancer Mother    Cancer Father    Multiple myeloma Sister    Throat cancer Sister     Prior to Admission medications   Medication Sig Start Date End Date Taking? Authorizing Provider  Acetaminophen (TYLENOL PO) Take 2 tablets by mouth daily as needed.   Yes [provider]    Physical Exam: Vitals:   07/05/23 1415 07/05/23 1545  BP: 115/84 122/83  Pulse: 100 97  Resp:  16  Temp: 98.3 F (36.8 C)   TempSrc: Oral   SpO2: 100% 100%   Cachectic adult male, lying in bed, appears weak and tired and listless Oropharynx moist, severely poor dentition, lips normal, sclera anicteric, conjunctival pink, lids and lashes normal, no nasal deformity, discharge, or epistaxis No oral lesions Neck without masses or adenopathy RRR, no murmurs, no JVD, 1+ pitting edema to the midshin Respiratory rate seems normal, lungs clear without rales or wheezes Abdomen soft, I do not palpate the liver edge, there is no masses, there is no ascites, there is no distention, there is no tenderness to palpation Psychomotor slowing is mild, he is attentive to questions, oriented to place and self, but not time.  He has generalized weakness in the upper and lower extremities bilaterally, 4/5 in all 4 extremities but symmetric, speech fluent    Data Reviewed: Basic metabolic panel shows hyponatremia, hypokalemia, elevated  anion gap acidosis, hypophosphatemia, hypomagnesemia CBC shows macrocytic anemia, severe thrombocytopenia Lipase elevated, AST elevated, ALT normal, total bilirubin 3.9 Troponin and CK normal Ammonia 54 VBG shows abnormally low oxygen, does not correlate with symptoms or her pulse ox Alcohol level normal CT head shows chronic right subdural hematoma, no change    Assessment and Plan: * Hypokalemia Hypomagnesemia Hypophosphatemia - Supplement magnesium and phosphorus and potassium  Apparent cirrhosis (HCC) INR and albumin near normal, but total bilirubin 3.9, platelets 46, and with LE edema.  Ammonia elevated but without slowing typical of HE.  Lipase elevated but no epigastric pain or vomiting. MELD 21. - Alcohol cessation - Trend Platelets, Tbili  Alcohol use disorder, moderate, dependence (HCC) Has history of alcohol withdrawal seizures in the past and history DTs during last admission  - Start thiamine and folate - CIWA monitoring and lorazepam PRN  Disorientation Patient oriented to self, [facility] long hospital, but not month, day of the week and states he was admitted yesterday and has been in the hospital 12 hours sleeping (he has been here about 3 hours) - Close CIWA monitoring  Generalized weakness - Repeat  echo - Check TSH  Protein-calorie malnutrition, severe (HCC) - Consult dietitian  Chronic subdural hematoma (HCC) Appears stable on CT  High anion gap metabolic acidosis Likely starvation and alcoholic ketosis - Consult dietitian - Trend BMP - Monitor for refeeding syndrome  Hyponatremia Appears euvolemic, likely low solute, SIADH or combination of both.   - Normal saline for now - Trend Na overnight  Macrocytic anemia Due to alcohol use  Thrombocytopenia (HCC) Due to alcohol - Trend platelets  Essential hypertension Not adherent to meds at home, BP soft here - Hold home meds         Advance Care Planning: Full code, confirmed with the  patient  Consults: None needed  Family Communication: Called to brother-in-law, no answer, spoke with wife, able to relay information  Severity of Illness: The appropriate patient status for this patient is INPATIENT. Inpatient status is judged to be reasonable and necessary in order to provide the required intensity of service to ensure the patient's safety. The patient's presenting symptoms, physical exam findings, and initial radiographic and laboratory data in the context of their chronic comorbidities is felt to place them at high risk for further clinical deterioration. Furthermore, it is not anticipated that the patient will be medically stable for discharge from the hospital within 2 midnights of admission.   * I certify that at the point of admission it is my clinical judgment that the patient will require inpatient hospital care spanning beyond 2 midnights from the point of admission due to high intensity of service, high risk for further deterioration and high frequency of surveillance required.*  Author: Alberteen Sam, MD 07/05/2023 5:30 PM  For on call review [email or web address].

## 2023-07-05 NOTE — Assessment & Plan Note (Signed)
Due to alcohol use ?

## 2023-07-05 NOTE — Hospital Course (Addendum)
63 y.o. 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  On admission patient had workup including CT head chest x-ray shoulder x-ray-chronic finding no acute changes Underwent further workup echo unremarkable, TSH stable starvation) improving electrolyte imbalance has at this time improved.  No withdrawal signs at this time and remains stable but weak and deconditioned and planning for PT OT skilled nursing facility once bed available.

## 2023-07-05 NOTE — Assessment & Plan Note (Signed)
Due to alcohol - Trend platelets

## 2023-07-05 NOTE — Assessment & Plan Note (Signed)
Patient oriented to self, [facility] long hospital, but not month, day of the week and states he was admitted yesterday and has been in the hospital 12 hours sleeping (he has been here about 3 hours) - Close CIWA monitoring

## 2023-07-05 NOTE — Assessment & Plan Note (Signed)
 Appears stable on CT.

## 2023-07-05 NOTE — Assessment & Plan Note (Addendum)
Not adherent to meds at home, BP soft here

## 2023-07-05 NOTE — Assessment & Plan Note (Addendum)
Improved from admission.  Stable - Fluid restriction

## 2023-07-05 NOTE — Assessment & Plan Note (Signed)
 Consult dietitian

## 2023-07-05 NOTE — ED Provider Notes (Signed)
[place] EMERGENCY DEPARTMENT AT [facility] Provider Note   CSN: [id number] Arrival date & time: 07/05/23  1112     History  Chief Complaint  Patient presents with   Weakness    KORBAN SHEARER is a 63 y.o. male.  Patient fell from bed 1.5 weeks ago. Family had difficulty getting him up from the floor due to pain. States patient remained down on the floor "for a few days". Family states they tried to make him comfortable there. He was refusing hospital visit at that time. Eventually family was able to get him up and sitting on the edge of the bed where he has remained for the last 7-10 days. Family states they positioned pillows around him to make him comfortable. Wife does not live with the patient but is here in ED with him currently. Patient lives with wife's brother, so patient's brother in law. Wife does state that she visits about once a week. Last discharged from hospital stay on 05/09/23 with dx of acute on chronic subdural hematoma, alcohol abuse, hyponatremia, and thrombocytopenia. Patient is currently Aox3. Denies fevers, chills, coughing, nausea, vomiting, abdominal pain, or dysuria. Denies chest pain or sob. Admits to global weakness.   The history is provided by the patient. No language interpreter was used.  Weakness Associated symptoms: no abdominal pain, no arthralgias, no chest pain, no cough, no dysuria, no fever, no seizures, no shortness of breath and no vomiting        Home Medications Prior to Admission medications   Not on File      Allergies    Patient has no known allergies.    Review of Systems   Review of Systems  Constitutional:  Negative for chills and fever.  HENT:  Negative for ear pain and sore throat.   Eyes:  Negative for pain and visual disturbance.  Respiratory:  Negative for cough and shortness of breath.   Cardiovascular:  Negative for chest pain and palpitations.  Gastrointestinal:  Negative for abdominal pain and vomiting.   Genitourinary:  Negative for dysuria and hematuria.  Musculoskeletal:  Negative for arthralgias and back pain.  Skin:  Negative for color change and rash.  Neurological:  Positive for weakness. Negative for seizures and syncope.  All other systems reviewed and are negative.   Physical Exam Updated Vital Signs BP 103/73 (BP Location: Right Arm)   Pulse 85   Temp 98.3 F (36.8 C) (Oral)   Resp 18   Ht 5\' 11"  (1.803 m)   Wt 53.8 kg   SpO2 100%   BMI 16.54 kg/m  Physical Exam Vitals and nursing note reviewed.  Constitutional:      General: He is not in acute distress.    Appearance: He is cachectic.  HENT:     Head: Normocephalic and atraumatic.  Eyes:     Conjunctiva/sclera: Conjunctivae normal.  Cardiovascular:     Rate and Rhythm: Normal rate and regular rhythm.     Heart sounds: No murmur heard. Pulmonary:     Effort: Pulmonary effort is normal. No respiratory distress.     Breath sounds: Normal breath sounds.  Abdominal:     Palpations: Abdomen is soft.     Tenderness: There is no abdominal tenderness.  Musculoskeletal:        General: No swelling.     Cervical back: Neck supple.  Skin:    General: Skin is warm and dry.     Capillary Refill: Capillary refill takes less  than 2 seconds.       Neurological:     Mental Status: He is alert and oriented to person, place, and time.     GCS: GCS eye subscore is 4. GCS verbal subscore is 5. GCS motor subscore is 6.     Cranial Nerves: Cranial nerves 2-12 are intact.     Sensory: Sensation is intact.     Motor: Motor function is intact.     Coordination: Coordination is intact.  Psychiatric:        Mood and Affect: Mood normal.     ED Results / Procedures / Treatments   Labs (all labs ordered are listed, but only abnormal results are displayed) Labs Reviewed  CULTURE, BLOOD (ROUTINE X 2) - Abnormal; Notable for the following components:      Result Value   Culture   (*)    Value: STAPHYLOCOCCUS  EPIDERMIDIS THE SIGNIFICANCE OF ISOLATING THIS ORGANISM FROM A SINGLE SET OF BLOOD CULTURES WHEN MULTIPLE SETS ARE DRAWN IS UNCERTAIN. PLEASE NOTIFY THE MICROBIOLOGY DEPARTMENT WITHIN ONE WEEK IF SPECIATION AND SENSITIVITIES ARE REQUIRED. Performed at [facility], [address] [place], [name] [address]    All other components within normal limits  BLOOD CULTURE ID PANEL (REFLEXED) - BCID2 - Abnormal; Notable for the following components:   Staphylococcus species DETECTED (*)    Staphylococcus epidermidis DETECTED (*)    All other components within normal limits  CBC WITH DIFFERENTIAL/PLATELET - Abnormal; Notable for the following components:   RBC 3.08 (*)    Hemoglobin 10.5 (*)    HCT 30.7 (*)    MCH 34.1 (*)    Platelets 46 (*)    Lymphs Abs 0.6 (*)    All other components within normal limits  COMPREHENSIVE METABOLIC PANEL - Abnormal; Notable for the following components:   Sodium 129 (*)    Potassium 2.8 (*)    Chloride 86 (*)    AST 72 (*)    Total Bilirubin 3.9 (*)    Anion gap 20 (*)    All other components within normal limits  LIPASE, BLOOD - Abnormal; Notable for the following components:   Lipase 299 (*)    All other components within normal limits  RAPID URINE DRUG SCREEN, HOSP PERFORMED - Abnormal; Notable for the following components:   Benzodiazepines POSITIVE (*)    All other components within normal limits  PROTIME-INR - Abnormal; Notable for the following components:   Prothrombin Time 15.7 (*)    All other components within normal limits  AMMONIA - Abnormal; Notable for the following components:   Ammonia 54 (*)    All other components within normal limits  MAGNESIUM - Abnormal; Notable for the following components:   Magnesium 1.3 (*)    All other components within normal limits  PHOSPHORUS - Abnormal; Notable for the following components:   Phosphorus 1.5 (*)    All other components within normal limits  BLOOD GAS, VENOUS - Abnormal; Notable  for the following components:   pH, Ven 7.45 (*)    pCO2, Ven 40 (*)    pO2, Ven <31 (*)    Acid-Base Excess 3.5 (*)    All other components within normal limits  URINALYSIS, W/ REFLEX TO CULTURE (INFECTION SUSPECTED) - Abnormal; Notable for the following components:   Color, Urine AMBER (*)    Bilirubin Urine SMALL (*)    Ketones, ur 5 (*)    Protein, ur 30 (*)    All  other components within normal limits  MAGNESIUM - Abnormal; Notable for the following components:   Magnesium 1.6 (*)    All other components within normal limits  PHOSPHORUS - Abnormal; Notable for the following components:   Phosphorus <1.0 (*)    All other components within normal limits  SODIUM - Abnormal; Notable for the following components:   Sodium 127 (*)    All other components within normal limits  PROTIME-INR - Abnormal; Notable for the following components:   Prothrombin Time 17.3 (*)    INR 1.4 (*)    All other components within normal limits  COMPREHENSIVE METABOLIC PANEL - Abnormal; Notable for the following components:   Sodium 127 (*)    Chloride 90 (*)    Glucose, Bld 104 (*)    Creatinine, Ser 0.55 (*)    Calcium 8.2 (*)    Total Protein 6.0 (*)    Albumin 2.8 (*)    AST 78 (*)    Total Bilirubin 3.3 (*)    All other components within normal limits  CBC - Abnormal; Notable for the following components:   WBC 3.2 (*)    RBC 2.48 (*)    Hemoglobin 8.5 (*)    HCT 24.8 (*)    MCH 34.3 (*)    Platelets 40 (*)    All other components within normal limits  RENAL FUNCTION PANEL - Abnormal; Notable for the following components:   Sodium 130 (*)    Chloride 94 (*)    Glucose, Bld 158 (*)    Calcium 8.3 (*)    Albumin 2.8 (*)    All other components within normal limits  CBC - Abnormal; Notable for the following components:   WBC 2.6 (*)    RBC 2.46 (*)    Hemoglobin 8.5 (*)    HCT 25.3 (*)    MCV 102.8 (*)    MCH 34.6 (*)    Platelets 46 (*)    All other components within normal  limits  MAGNESIUM - Abnormal; Notable for the following components:   Magnesium 1.6 (*)    All other components within normal limits  PHOSPHORUS - Abnormal; Notable for the following components:   Phosphorus 2.4 (*)    All other components within normal limits  COMPREHENSIVE METABOLIC PANEL - Abnormal; Notable for the following components:   Sodium 129 (*)    Potassium 3.3 (*)    Chloride 94 (*)    Creatinine, Ser 0.52 (*)    Calcium 8.5 (*)    Total Protein 6.1 (*)    Albumin 2.8 (*)    AST 85 (*)    Total Bilirubin 1.7 (*)    All other components within normal limits  PROTIME-INR - Abnormal; Notable for the following components:   Prothrombin Time 16.7 (*)    INR 1.3 (*)    All other components within normal limits  RENAL FUNCTION PANEL - Abnormal; Notable for the following components:   Sodium 130 (*)    Chloride 95 (*)    Glucose, Bld 116 (*)    Creatinine, Ser 0.58 (*)    Calcium 8.0 (*)    Albumin 2.6 (*)    All other components within normal limits  MAGNESIUM - Abnormal; Notable for the following components:   Magnesium 1.6 (*)    All other components within normal limits  CBC - Abnormal; Notable for the following components:   WBC 2.6 (*)    RBC 2.38 (*)    Hemoglobin  8.1 (*)    HCT 24.5 (*)    MCV 102.9 (*)    Platelets 72 (*)    All other components within normal limits  BASIC METABOLIC PANEL - Abnormal; Notable for the following components:   Sodium 131 (*)    Glucose, Bld 138 (*)    Creatinine, Ser 0.47 (*)    Calcium 8.7 (*)    All other components within normal limits  CBC - Abnormal; Notable for the following components:   WBC 3.0 (*)    RBC 2.67 (*)    Hemoglobin 9.2 (*)    HCT 28.3 (*)    MCV 106.0 (*)    MCH 34.5 (*)    RDW 15.7 (*)    Platelets 101 (*)    All other components within normal limits  CULTURE, BLOOD (ROUTINE X 2)  SARS CORONAVIRUS 2 BY RT PCR  RESPIRATORY PANEL BY PCR  CULTURE, BLOOD (ROUTINE X 2)  CULTURE, BLOOD (ROUTINE X  2)  ETHANOL  CK  TSH  OCCULT BLOOD X 1 CARD TO LAB, STOOL  TROPONIN I (HIGH SENSITIVITY)  TROPONIN I (HIGH SENSITIVITY)    EKG None  Radiology No results found.   Procedures .Critical Care  Performed by: Franne Forts, DO Authorized by: Franne Forts, DO   Critical care provider statement:    Critical care time (minutes):  30   Critical care was time spent personally by me on the following activities:  Development of treatment plan with patient or surrogate, discussions with consultants, evaluation of patient's response to treatment, examination of patient, ordering and review of laboratory studies, ordering and review of radiographic studies, ordering and performing treatments and interventions, pulse oximetry, re-evaluation of patient's condition and review of old charts   Care discussed with: admitting provider       Medications Ordered in ED Medications  magnesium oxide (MAG-OX) tablet 400 mg (400 mg Oral Given 07/10/23 1015)  phosphorus (K PHOS NEUTRAL) tablet 500 mg (500 mg Oral Given 07/05/23 2107)  thiamine (VITAMIN B1) tablet 100 mg (100 mg Oral Given 07/10/23 1014)  folic acid (FOLVITE) tablet 1 mg (1 mg Oral Given 07/10/23 1014)  multivitamin with minerals tablet 1 tablet (1 tablet Oral Given 07/10/23 1014)  perflutren lipid microspheres (DEFINITY) IV suspension (2 mLs Intravenous Given 07/06/23 1027)  feeding supplement (ENSURE ENLIVE / ENSURE PLUS) liquid 237 mL (237 mLs Oral Given 07/10/23 2026)  lactulose (CHRONULAC) 10 GM/15ML solution 20 g (20 g Oral Patient Refused/Not Given 07/10/23 2027)  furosemide (LASIX) tablet 20 mg (20 mg Oral Given 07/10/23 1014)  acetaminophen (TYLENOL) tablet 650 mg (650 mg Oral Given 07/05/23 1527)  0.9 %  sodium chloride infusion ( Intravenous New Bag/Given 07/05/23 1626)  potassium chloride SA (KLOR-CON M) CR tablet 40 mEq (40 mEq Oral Given 07/05/23 1626)  potassium chloride 10 mEq in 100 mL IVPB (10 mEq Intravenous New Bag/Given  07/05/23 2109)  magnesium sulfate IVPB 2 g 50 mL (2 g Intravenous New Bag/Given 07/05/23 1815)  magnesium sulfate IVPB 2 g 50 mL (2 g Intravenous New Bag/Given 07/06/23 0005)  potassium PHOSPHATE 45 mmol in sodium chloride 0.9 % 500 mL infusion (0 mmol Intravenous Stopped 07/06/23 0956)  traMADol (ULTRAM) tablet 50 mg (50 mg Oral Given 07/06/23 2036)  magnesium sulfate IVPB 2 g 50 mL (2 g Intravenous New Bag/Given 07/07/23 0938)  phosphorus (K PHOS NEUTRAL) tablet 500 mg (500 mg Oral Given 07/07/23 2052)  magnesium sulfate IVPB 2 g 50 mL (  2 g Intravenous New Bag/Given 07/08/23 1006)  hydrOXYzine (ATARAX) tablet 25 mg (25 mg Oral Given 07/08/23 2056)    ED Course/ Medical Decision Making/ A&P                                 Medical Decision Making Amount and/or Complexity of Data Reviewed Labs: ordered. Radiology: ordered.  Risk OTC drugs. Prescription drug management. Decision regarding hospitalization.   Patient is a 63 year old male with past medical history of alcoholism, previous trauma related subdural hematomas, hyponatremia, hypophosphatemia, and multiple prior ED evaluations and admissions presenting for generalized weakness.  Patient was reported to have been on the floor for 3 days and then on the edge of the bed for 7 to 10 days due to significant weakness.  Family attempting to care for the patient however is limited due to patient's inability to want to go to the hospital.  Arrived to emergency department covered in feces.  He is awake and alert x 3 and moving all 4 extremities without difficulty.  No signs or symptoms on infection.  2 superficial elbow skin tears on the left.  Stage II sacral decub present.  No signs of infection.  Laboratory studies concerning for hyponatremia, hypophosphatemia, hypomagnesemia hypokalemia.  Electrolytes replaced.  Maintenance fluids started.  Dehydration and electrolyte abnormalities likely secondary to chronic alcohol abuse and current generalized  weakness resulting in decreased p.o. intake.  Patient recommended for inpatient placement.        Final Clinical Impression(s) / ED Diagnoses Final diagnoses:  Hyponatremia  Hypokalemia  Hypophosphatemia  Thrombocytopenia (HCC)  Anemia, unspecified type  Alcohol abuse  Generalized weakness  Dehydration    Rx / DC Orders ED Discharge Orders     None         Franne Forts, DO 07/10/23 2332

## 2023-07-05 NOTE — Assessment & Plan Note (Addendum)
Has history of alcohol withdrawal seizures in the past and history DTs during last admission  Thankfully no withdrawals or DTs here - Continue thiamine and folate

## 2023-07-05 NOTE — Assessment & Plan Note (Addendum)
Hypomagnesemia Hypophosphatemia Refeeding syndrome Phos replete.  Mag, K low.   - Supplement K, mag

## 2023-07-05 NOTE — Assessment & Plan Note (Addendum)
Alcoholic cirrhosis Hepatic encephalopathy Encephalopathy seems to be improving, oriented to place and time today.   - Continue lactulose - Hold diuretics for now - Alcohol cessation - Trend Platelets, Tbili

## 2023-07-05 NOTE — Assessment & Plan Note (Addendum)
Refeeding syndrome Resolved

## 2023-07-05 NOTE — Assessment & Plan Note (Addendum)
Echo unremarkable, TSH normal

## 2023-07-06 ENCOUNTER — Inpatient Hospital Stay (HOSPITAL_COMMUNITY): Payer: BC Managed Care – PPO

## 2023-07-06 DIAGNOSIS — E860 Dehydration: Secondary | ICD-10-CM | POA: Diagnosis not present

## 2023-07-06 DIAGNOSIS — E876 Hypokalemia: Secondary | ICD-10-CM | POA: Diagnosis not present

## 2023-07-06 DIAGNOSIS — I10 Essential (primary) hypertension: Secondary | ICD-10-CM | POA: Diagnosis not present

## 2023-07-06 LAB — COMPREHENSIVE METABOLIC PANEL
ALT: 19 U/L (ref 0–44)
AST: 78 U/L — ABNORMAL HIGH (ref 15–41)
Albumin: 2.8 g/dL — ABNORMAL LOW (ref 3.5–5.0)
Alkaline Phosphatase: 79 U/L (ref 38–126)
Anion gap: 11 (ref 5–15)
BUN: 10 mg/dL (ref 8–23)
CO2: 26 mmol/L (ref 22–32)
Calcium: 8.2 mg/dL — ABNORMAL LOW (ref 8.9–10.3)
Chloride: 90 mmol/L — ABNORMAL LOW (ref 98–111)
Creatinine, Ser: 0.55 mg/dL — ABNORMAL LOW (ref 0.61–1.24)
GFR, Estimated: >60 mL/min (ref >60)
Glucose, Bld: 104 mg/dL — ABNORMAL HIGH (ref 70–99)
Potassium: 3.5 mmol/L (ref 3.5–5.1)
Sodium: 127 mmol/L — ABNORMAL LOW (ref 135–145)
Total Bilirubin: 3.3 mg/dL — ABNORMAL HIGH (ref 0.0–1.2)
Total Protein: 6 g/dL — ABNORMAL LOW (ref 6.5–8.1)

## 2023-07-06 LAB — RENAL FUNCTION PANEL
Albumin: 2.8 g/dL — ABNORMAL LOW (ref 3.5–5.0)
Anion gap: 12 (ref 5–15)
BUN: 11 mg/dL (ref 8–23)
CO2: 24 mmol/L (ref 22–32)
Calcium: 8.3 mg/dL — ABNORMAL LOW (ref 8.9–10.3)
Chloride: 94 mmol/L — ABNORMAL LOW (ref 98–111)
Creatinine, Ser: 0.67 mg/dL (ref 0.61–1.24)
GFR, Estimated: >60 mL/min (ref >60)
Glucose, Bld: 158 mg/dL — ABNORMAL HIGH (ref 70–99)
Phosphorus: 2.5 mg/dL (ref 2.5–4.6)
Potassium: 3.6 mmol/L (ref 3.5–5.1)
Sodium: 130 mmol/L — ABNORMAL LOW (ref 135–145)

## 2023-07-06 LAB — BLOOD CULTURE ID PANEL (REFLEXED) - BCID2

## 2023-07-06 LAB — CBC
HCT: 24.8 % — ABNORMAL LOW (ref 39.0–52.0)
Hemoglobin: 8.5 g/dL — ABNORMAL LOW (ref 13.0–17.0)
MCH: 34.3 pg — ABNORMAL HIGH (ref 26.0–34.0)
MCHC: 34.3 g/dL (ref 30.0–36.0)
MCV: 100 fL (ref 80.0–100.0)
Platelets: 40 10*3/uL — ABNORMAL LOW (ref 150–400)
RBC: 2.48 MIL/uL — ABNORMAL LOW (ref 4.22–5.81)
RDW: 14.6 % (ref 11.5–15.5)
WBC: 3.2 10*3/uL — ABNORMAL LOW (ref 4.0–10.5)
nRBC: 0 % (ref 0.0–0.2)

## 2023-07-06 LAB — ECHOCARDIOGRAM COMPLETE
AR max vel: 2.78 cm2
AV Area VTI: 2.82 cm2
AV Area mean vel: 2.6 cm2
AV Mean grad: 4 mm[Hg]
AV Peak grad: 6.9 mm[Hg]
Ao pk vel: 1.31 m/s
Area-P 1/2: 3.13 cm2
MV VTI: 2.22 cm2
S' Lateral: 3.1 cm
Single Plane A4C EF: 68.3 %

## 2023-07-06 LAB — OCCULT BLOOD X 1 CARD TO LAB, STOOL: Fecal Occult Bld: NEGATIVE

## 2023-07-06 LAB — TSH: TSH: 3.853 u[IU]/mL (ref 0.350–4.500)

## 2023-07-06 LAB — PROTIME-INR
INR: 1.4 — ABNORMAL HIGH (ref 0.8–1.2)
Prothrombin Time: 17.3 s — ABNORMAL HIGH (ref 11.4–15.2)

## 2023-07-06 MED ORDER — ENSURE ENLIVE PO LIQD
237.0000 mL | Freq: Three times a day (TID) | ORAL | Status: DC
  Administered 2023-07-06 – 2023-07-15 (×17): 237 mL via ORAL

## 2023-07-06 MED ORDER — TRAMADOL HCL 50 MG PO TABS
50.0000 mg | ORAL_TABLET | Freq: Once | ORAL | Status: AC
  Administered 2023-07-06: 50 mg via ORAL
  Filled 2023-07-06: qty 1

## 2023-07-06 MED ORDER — PERFLUTREN LIPID MICROSPHERE
1.0000 mL | INTRAVENOUS | Status: AC | PRN
  Administered 2023-07-06: 2 mL via INTRAVENOUS

## 2023-07-06 MED ORDER — PANTOPRAZOLE SODIUM 40 MG IV SOLR
40.0000 mg | Freq: Two times a day (BID) | INTRAVENOUS | Status: DC
  Administered 2023-07-06 (×2): 40 mg via INTRAVENOUS
  Filled 2023-07-06 (×2): qty 10

## 2023-07-06 MED ORDER — LACTULOSE 10 GM/15ML PO SOLN
20.0000 g | Freq: Two times a day (BID) | ORAL | Status: DC
Start: 2023-07-06 — End: 2023-07-15
  Administered 2023-07-06 – 2023-07-15 (×12): 20 g via ORAL
  Filled 2023-07-06 (×16): qty 30

## 2023-07-06 NOTE — Progress Notes (Signed)
PHARMACY - PHYSICIAN COMMUNICATION CRITICAL VALUE ALERT - BLOOD CULTURE IDENTIFICATION (BCID)  Richard Andrews is an 63 y.o. male who presented to [facility] on 07/05/2023 with a chief complaint of weakness, alcohol use disorder  Assessment:  1/2 aerobic, GPC clusters, staph epi, no R  Name of physician (or Provider) Contacted: Virgel Manifold  Current antibiotics: none  Changes to prescribed antibiotics recommended:  None probable contaminant  Results for orders placed or performed during the hospital encounter of 07/05/23  Blood Culture ID Panel (Reflexed) (Collected: 07/05/2023  5:46 PM)  Result Value Ref Range   Enterococcus faecalis NOT DETECTED NOT DETECTED   Enterococcus Faecium NOT DETECTED NOT DETECTED   Listeria monocytogenes NOT DETECTED NOT DETECTED   Staphylococcus species DETECTED (A) NOT DETECTED   Staphylococcus aureus (BCID) NOT DETECTED NOT DETECTED   Staphylococcus epidermidis DETECTED (A) NOT DETECTED   Staphylococcus lugdunensis NOT DETECTED NOT DETECTED   Streptococcus species NOT DETECTED NOT DETECTED   Streptococcus agalactiae NOT DETECTED NOT DETECTED   Streptococcus pneumoniae NOT DETECTED NOT DETECTED   Streptococcus pyogenes NOT DETECTED NOT DETECTED   A.calcoaceticus-baumannii NOT DETECTED NOT DETECTED   Bacteroides fragilis NOT DETECTED NOT DETECTED   Enterobacterales NOT DETECTED NOT DETECTED   Enterobacter cloacae complex NOT DETECTED NOT DETECTED   Escherichia coli NOT DETECTED NOT DETECTED   Klebsiella aerogenes NOT DETECTED NOT DETECTED   Klebsiella oxytoca NOT DETECTED NOT DETECTED   Klebsiella pneumoniae NOT DETECTED NOT DETECTED   Proteus species NOT DETECTED NOT DETECTED   Salmonella species NOT DETECTED NOT DETECTED   Serratia marcescens NOT DETECTED NOT DETECTED   Haemophilus influenzae NOT DETECTED NOT DETECTED   Neisseria meningitidis NOT DETECTED NOT DETECTED   Pseudomonas aeruginosa NOT DETECTED NOT DETECTED   Stenotrophomonas maltophilia NOT  DETECTED NOT DETECTED   Candida albicans NOT DETECTED NOT DETECTED   Candida auris NOT DETECTED NOT DETECTED   Candida glabrata NOT DETECTED NOT DETECTED   Candida krusei NOT DETECTED NOT DETECTED   Candida parapsilosis NOT DETECTED NOT DETECTED   Candida tropicalis NOT DETECTED NOT DETECTED   Cryptococcus neoformans/gattii NOT DETECTED NOT DETECTED   Methicillin resistance mecA/C NOT DETECTED NOT DETECTED    Arley Phenix RPh 07/06/2023, 10:17 PM

## 2023-07-06 NOTE — Progress Notes (Signed)
PT Cancellation Note  Patient Details Name: Richard Andrews MRN: [id number] DOB: 05/20/1961   Cancelled Treatment:    Reason Eval/Treat Not Completed: Patient at procedure or test/unavailable Will check back [facility] Acute [facility] Services Office [phone] Weekend pager-[phone]   Rada Hay 07/06/2023, 11:36 AM

## 2023-07-06 NOTE — Evaluation (Signed)
Physical Therapy Evaluation Patient Details Name: Richard Andrews MRN: [id number] DOB: [birth year] Today's Date: 07/06/2023  History of Present Illness  Richard Andrews is a 63 y.o. M with alcohol use disorder, hx complicated withdrawals, hx acute traumatic SDH . 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  Pt currently with functional limitations due to the deficits listed below (see PT Problem List). Pt will benefit from acute skilled PT to increase their independence and safety with mobility to allow discharge.     The patient  resting in bed, feeding self. Assisted to  moving to sitting with total  +2 assistance, strong posterior bias when sitting and attempts to stand.Patient unable to remain standing.  Patient reports assistance from brother in law and separated wife. Reports use of WC , unsure amount of assistance required from family for  transfers and ADL's .  Patient will benefit from continued inpatient follow up therapy, <3 hours/day. No family present.    If plan is discharge home, recommend the following: Two people to help with walking and/or transfers;Two people to help with bathing/dressing/bathroom;Help with stairs or ramp for entrance;Assist for transportation   Can travel by private vehicle   No    Equipment Recommendations None recommended by PT  Recommendations for Other Services       Functional Status Assessment Patient has had a recent decline in their functional status and/or demonstrates limited ability to make significant improvements in function in a reasonable and predictable amount of time     Precautions / Restrictions Precautions Precautions: Fall Precaution Comments:  incontinent Restrictions Weight Bearing Restrictions Per Provider Order: No      Mobility  Bed Mobility Overal bed mobility: Needs Assistance Bed Mobility: Rolling, Sidelying to Sit, Sit to Supine Rolling: Total assist, +2 for physical assistance, +2 for safety/equipment Sidelying to sit: Total assist, +2 for physical assistance, +2 for safety/equipment   Sit to supine: Total assist, +2 for physical assistance, +2 for safety/equipment   General bed mobility comments: patient tremulous and somewhat resistive, stiffness in trunk and extremities to roll and assume siting, total to stand from bed multiple attemps, does not stay in standing, pushing backward.    Transfers                   General transfer comment: unable    Ambulation/Gait                  Stairs            Wheelchair Mobility     Tilt Bed    Modified Rankin (Stroke Patients Only)       Balance Overall balance assessment: Needs assistance Sitting-balance support: Bilateral upper extremity supported, Feet supported Sitting balance-Leahy Scale: Zero Sitting balance - Comments: posterior pushing Postural control: Posterior lean Standing balance support: Bilateral upper extremity supported, During functional activity, Reliant on assistive device for balance Standing balance-Leahy Scale: Zero Standing balance comment: posterior pushing                             Pertinent Vitals/Pain Pain Assessment Pain Assessment: Faces Faces Pain Scale: Hurts even more Pain Location: everwhere Pain Descriptors / Indicators:  Aching Pain Intervention(s): Monitored during session    Home Living Family/patient expects to be discharged to:: Private residence Living Arrangements: Other relatives Available Help at Discharge: Family;Available 24 hours/day Type of Home: House Home Access: Ramped entrance;Stairs to enter Entrance Stairs-Rails: Can reach both Entrance Stairs-Number of  Steps: Uses ramped entrance   Home Layout: Two level;Able to live on main level with bedroom/bathroom Home Equipment: Rolling Walker (2 wheels);Wheelchair - manual Additional Comments: Pt states he lives with his brother in law, uses w/c for mobility around home, help with IADLs. Pt reports he just retired from [employer] recently  Some information taken from previous [employer] is questionalbel    Prior Function Prior Level of Function : Needs assist             Mobility Comments: Pt states he uses w/c around home, assist for bed mobility at times       Extremity/Trunk Assessment        Lower Extremity Assessment Lower Extremity Assessment: Generalized weakness    Cervical / Trunk Assessment Cervical / Trunk Assessment: Kyphotic  Communication   Communication Communication: No apparent difficulties  Cognition Arousal: Alert Behavior During Therapy: Flat affect Overall Cognitive Status: Impaired/Different from baseline Area of Impairment: Orientation, Awareness, Memory                 Orientation Level: Time, Situation         Awareness: Emergent   General Comments: patient  does not indicate awareness of his state when brought to ED from home        General Comments      Exercises     Assessment/Plan    PT Assessment Patient needs continued PT services  PT Problem List Decreased strength;Decreased balance;Decreased cognition;Decreased knowledge of precautions;Decreased range of motion;Decreased mobility;Decreased knowledge of use of DME;Decreased activity tolerance;Decreased safety awareness;Decreased skin integrity       PT Treatment Interventions DME instruction;Therapeutic activities;Cognitive remediation;Gait training;Therapeutic exercise;Patient/family education;Functional mobility training    PT Goals (Current goals can be found in the Care Plan section)  Acute Rehab PT Goals Patient Stated Goal: agreeable to mobilize PT Goal  Formulation: With patient Time For Goal Achievement: 07/20/23 Potential to Achieve Goals: Fair    Frequency Min 1X/week     Co-evaluation PT/OT/SLP Co-Evaluation/Treatment: Yes Reason for Co-Treatment: Necessary to address cognition/behavior during functional activity;For patient/therapist safety;To address functional/ADL transfers PT goals addressed during session: Mobility/safety with mobility         AM-PAC PT "6 Clicks" Mobility  Outcome Measure Help needed turning from your back to your side while in a flat bed without using bedrails?: A Lot Help needed moving from lying on your back to sitting on the side of a flat bed without using bedrails?: Total Help needed moving to and from a bed to a chair (including a wheelchair)?: Total Help needed standing up from a chair using your arms (e.g., wheelchair or bedside chair)?: Total Help needed to walk in hospital room?: Total Help needed climbing 3-5 steps with a railing? : Total 6 Click Score: 7    End of Session Equipment Utilized During Treatment: Gait belt Activity Tolerance: Patient limited by fatigue;Treatment limited secondary to medical complications (Comment) Patient left: in bed;with bed alarm set;with call bell/phone within reach Nurse Communication: Mobility status;Need for lift equipment PT Visit Diagnosis: Unsteadiness on feet (R26.81);Muscle weakness (generalized) (M62.81)    Time: 1914-7829 PT Time Calculation (min) (ACUTE ONLY): 27 min   Charges:   PT Evaluation $  PT Eval Low Complexity: 1 Low   PT General Charges $$ ACUTE PT VISIT: 1 Visit         Blanchard Kelch PT Acute Rehabilitation Services Office [phone] Weekend pager-[phone]   Rada Hay 07/06/2023, 1:10 PM

## 2023-07-06 NOTE — Evaluation (Signed)
Occupational Therapy Evaluation Patient Details Name: Richard Andrews MRN: [id number] DOB: [birth year] Today's Date: 07/06/2023   History of Present Illness Richard Andrews is a 63 y.o. M with alcohol use disorder, hx complicated withdrawals, hx acute traumatic SDH . 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  Current level of function is below patient's typical baseline.   During this evaluation, patient was limited by generalized weakness, impaired activity tolerance, diffuse body pains, cognitive deficits, impaired motor planning, and tremors which increased with mobility, all of which has the potential to impact patient's safety and independence during functional mobility, as well as performance for ADLs.  Patient lives at home, with his brother-in-law who is able to provide 24/7 supervision and assistance per pt, but no family present to confirm.   Patient demonstrates fair rehab potential, and should benefit from continued skilled occupational therapy services while in acute care to maximize safety, independence and quality of life at home.  Continued occupational therapy services at a skilled nursing facility are recommended.  ?        If plan is discharge home, recommend the following: A lot of help with walking and/or transfers;Two people to help with walking and/or transfers;Assistance with  cooking/housework;Assist for transportation;Two people to help with bathing/dressing/bathroom;A lot of help with bathing/dressing/bathroom;Supervision due to cognitive status;Help with stairs or ramp for entrance    Functional Status Assessment  Patient has had a recent decline in their functional status and demonstrates the ability to make significant improvements in function in a reasonable and predictable amount of time.  Equipment Recommendations   (TBD)    Recommendations for Other Services       Precautions / Restrictions Precautions Precautions: Fall Precaution Comments: incontinent Restrictions Weight Bearing Restrictions Per Provider Order: No      Mobility Bed Mobility Overal bed mobility: Needs Assistance Bed Mobility: Rolling, Sidelying to Sit, Sit to Supine Rolling: Total assist, +2 for physical assistance, +2 for safety/equipment Sidelying to sit: Total assist, +2 for physical assistance, +2 for safety/equipment   Sit to supine: Total assist, +2 for physical assistance, +2 for safety/equipment   General bed mobility comments: patient tremulous and somewhat resistive, stiffness in trunk and extremities to roll and assume siting, total to stand from bed multiple attempts, does not stay in standing, pushing backward.    Transfers                   General transfer comment: unable      Balance Overall balance assessment: Needs assistance Sitting-balance support: Bilateral upper extremity supported, Feet supported Sitting balance-Leahy Scale: Zero Sitting balance - Comments: posterior pushing Postural control: Posterior lean Standing balance support: Bilateral upper extremity supported, During functional activity, Reliant on assistive device for balance Standing balance-Leahy Scale: Zero Standing balance comment: posterior pushing  ADL either performed or assessed with clinical judgement   ADL Overall ADL's : Needs  assistance/impaired Eating/Feeding: Set up;Bed level Eating/Feeding Details (indicate cue type and reason): Was unable to assist pt to chair successfully today. Grooming: Therapist, nutritional;Set up;Cueing for sequencing;Bed level   Upper Body Bathing: Minimal assistance;Bed level   Lower Body Bathing: Maximal assistance;Bed level;+2 for physical assistance   Upper Body Dressing : Moderate assistance;Bed level   Lower Body Dressing: Total assistance;+2 for physical assistance;Bed level     Toilet Transfer Details (indicate cue type and reason): Unable to step or pivot.  Total Assist of 2 to stand from EOB with pt inability to come fully upright and with very poor standing tolerance with need of Total As +2 lower to EOB. Toileting- Clothing Manipulation and Hygiene: Total assistance;+2 for physical assistance;Bed level Toileting - Clothing Manipulation Details (indicate cue type and reason): Seemed unaware of having bed level bowel movemoent.             Vision   Vision Assessment?: No apparent visual deficits     Perception         Praxis         Pertinent Vitals/Pain Pain Assessment Pain Assessment: Faces Faces Pain Scale: Hurts even more Pain Location: everwhere Pain Descriptors / Indicators: Aching Pain Intervention(s): Limited activity within patient's tolerance, Monitored during session, Repositioned     Extremity/Trunk Assessment Upper Extremity Assessment Upper Extremity Assessment: RUE deficits/detail;Generalized weakness RUE Deficits / Details: h/o shoulder surgery with ROM limited to ~80-90degrees and gross strength about 3+/5   Lower Extremity Assessment Lower Extremity Assessment: Generalized weakness   Cervical / Trunk Assessment Cervical / Trunk Assessment: Kyphotic;Other exceptions Cervical / Trunk Exceptions: Very tremulous throughout entire body, especially with standing.   Communication Communication Communication: No apparent difficulties Cueing  Techniques: Verbal cues;Tactile cues;Visual cues   Cognition Arousal: Alert Behavior During Therapy: Flat affect, Anxious Overall Cognitive Status: Impaired/Different from baseline Area of Impairment: Orientation, Awareness, Memory, Problem solving, Following commands, Safety/judgement                 Orientation Level: Time, Situation   Memory: Decreased short-term memory Following Commands: Follows one step commands consistently Safety/Judgement: Decreased awareness of safety Awareness: Emergent Problem Solving: Slow processing, Difficulty sequencing, Decreased initiation, Requires verbal cues General Comments: patient  does not indicate awareness of his state when brought to ED from home     General Comments       Exercises     Shoulder Instructions      Home Living Family/patient expects to be discharged to:: Skilled nursing facility Living Arrangements: Other relatives Available Help at Discharge: Family;Available 24 hours/day Type of Home: House Home Access: Ramped entrance;Stairs to enter Entrance Stairs-Number of Steps: Uses ramped entrance Entrance Stairs-Rails: Can reach both Home Layout: Two level;Able to live on main level with bedroom/bathroom;1/2 bath on main level Alternate Level Stairs-Number of Steps: approximately 14 Alternate Level Stairs-Rails: Right;Left;Can reach both Bathroom Shower/Tub: Tub/shower unit         Home Equipment: Agricultural consultant (2 wheels);Wheelchair - manual   Additional Comments: Pt states he lives with his brother in law, uses w/c for mobility around home, help with IADLs. Pt reports he just retired from [employer] recently  Some information taken from previous encounter. Pt info is questionalbe with cognition.      Prior Functioning/Environment Prior Level of Function : Needs assist       Physical Assist : ADLs (physical)   ADLs (  physical): IADLs Mobility Comments: Pt states he uses w/c around home, assist for bed  mobility at times ADLs Comments: Ind with ADLs "most of the time" but reports his Bro-in-law assists PRN.  Again, pt a compromised hiostorian. Example: Pt reports that he had not moved his bowels in 3 months, however chart reports that EMS found pt on floor, covered in his feces.        OT Problem List: Decreased activity tolerance;Decreased range of motion;Decreased safety awareness;Decreased cognition;Decreased strength;Impaired balance (sitting and/or standing);Pain;Decreased knowledge of use of DME or AE;Impaired UE functional use;Cardiopulmonary status limiting activity;Decreased coordination      OT Treatment/Interventions: Self-care/ADL training;Therapeutic activities;Cognitive remediation/compensation;Therapeutic exercise;Patient/family education;Balance training;DME and/or AE instruction    OT Goals(Current goals can be found in the care plan section) Acute Rehab OT Goals OT Goal Formulation: Patient unable to participate in goal setting Time For Goal Achievement: 07/20/23 Potential to Achieve Goals: Fair ADL Goals Pt Will Perform Upper Body Dressing: with set-up;with supervision;sitting (without cues for sequencing or motor planning) Pt Will Transfer to Toilet: stand pivot transfer;bedside commode;with contact guard assist Pt/caregiver will Perform Home Exercise Program: Increased ROM;Increased strength;Both right and left upper extremity;With Supervision Additional ADL Goal #1: Pt will improve sitting balance from Zero to fair and tolerate at least 20 min at EOB for light ADLs with use of BUEs such as grooming or feeding. Additional ADL Goal #2: Pt will demonstrate improved mentation by scoring <4/10 on short blessed test and answering 4/4 safety questions from the [facility] correctly:   1. What do you do for yourself if you are sick with a cold.    2. What do you do if you burn yourself and the wound becomes infected.    3. What do you do if you experience severe chest pain and shortness  of breath?   4. What number do you call in an emergency?  OT Frequency: Min 1X/week    Co-evaluation PT/OT/SLP Co-Evaluation/Treatment: Yes Reason for Co-Treatment: Necessary to address cognition/behavior during functional activity;For patient/therapist safety;To address functional/ADL transfers PT goals addressed during session: Mobility/safety with mobility OT goals addressed during session: ADL's and self-care      AM-PAC OT "6 Clicks" Daily Activity     Outcome Measure Help from another person eating meals?: A Little Help from another person taking care of personal grooming?: A Little Help from another person toileting, which includes using toliet, bedpan, or urinal?: Total Help from another person bathing (including washing, rinsing, drying)?: A Lot Help from another person to put on and taking off regular upper body clothing?: A Lot Help from another person to put on and taking off regular lower body clothing?: Total 6 Click Score: 12   End of Session Equipment Utilized During Treatment: Gait belt;Rolling walker (2 wheels) Nurse Communication: Mobility status  Activity Tolerance: Patient limited by fatigue;Treatment limited secondary to medical complications (Comment) Patient left: in bed;with call bell/phone within reach;with bed alarm set  OT Visit Diagnosis: Unsteadiness on feet (R26.81);Dizziness and giddiness (R42);Other abnormalities of gait and mobility (R26.89);Pain;History of falling (Z91.81);Other symptoms and signs involving cognitive function;Ataxia, unspecified (R27.0) Pain - part of body:  ("all over")                Time: 8657-8469 OT Time Calculation (min): 27 min Charges:  OT General Charges $OT Visit: 1 Visit OT Evaluation $OT Eval Low Complexity: 1 Low  Powell Halbert, OT Acute Rehab Services Office: [phone] 07/06/2023   Theodoro Clock 07/06/2023, 1:32 PM

## 2023-07-06 NOTE — Care Plan (Signed)
Called to wife, no answer

## 2023-07-06 NOTE — Progress Notes (Signed)
Initial Nutrition Assessment  DOCUMENTATION CODES:   Severe malnutrition in context of social or environmental circumstances  INTERVENTION:   Monitor magnesium, potassium, and phosphorus for at least 3 days, MD to replete as needed, as pt is at risk for refeeding syndrome.  -Ensure Plus High Protein po TID, each supplement provides 350 kcal and 20 grams of protein.  -Continue CIWA vitamin supplementation -Magic cup BID with meals, each supplement provides 290 kcal and 9 grams of protein   -Needs updated weight for admission  NUTRITION DIAGNOSIS:   Severe Malnutrition related to social / environmental circumstances (alcohol abuse) as evidenced by moderate fat depletion, severe muscle depletion, energy intake < or equal to 50% for > or equal to 1 month.  GOAL:   Patient will meet greater than or equal to 90% of their needs  MONITOR:   PO intake, Supplement acceptance  REASON FOR ASSESSMENT:   Consult Assessment of nutrition requirement/status  ASSESSMENT:   63 y.o. 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  Patient in room, more oriented today. Pt reports poor appetite, states he has good and bad days. On his good days, he eats 2 "meals" a day. Has a hot dog and then a hamburger. One bad days he doesn't eat anything. Reports alcohol use and states this does limit how much he eats. Per chart review, pt was found amongst liquor bottles.  Pt denies any issues with swallowing or taste.  Pt reports he ate 2 pancakes and sausage today. Was unable to open his Ensure at bedside so RD opened it for him and he drank some during visit. Pt agreeable to Magic cups with meals as well. Given inconsistent intakes and alcohol abuse, at refeeding syndrome  risk.   RD attempted to obtain bed scale weight but bed wouldn't work. Recommend weight for admission. Last weight recorded from December 2024.  Medications: Folic acid, MAG-OX, Multivitamin with minerals daily, Thiamine, IV Mg sulfate, IV K-Phos  Labs reviewed: Low Na Low Phos Low Mg UDS +: Benzos   NUTRITION - FOCUSED PHYSICAL EXAM:  Flowsheet Row Most Recent Value  Orbital Region Moderate depletion  Upper Arm Region Moderate depletion  Thoracic and Lumbar Region Moderate depletion  Buccal Region Moderate depletion  Temple Region Severe depletion  Clavicle Bone Region Severe depletion  Clavicle and Acromion Bone Region Severe depletion  Scapular Bone Region Severe depletion  Dorsal Hand Moderate depletion  Patellar Region Severe depletion  Anterior Thigh Region Severe depletion  Posterior Calf Region Severe depletion  [bandaged area on rt calf]  Edema (RD Assessment) None  Hair Reviewed  [thinning]  Eyes Reviewed  Mouth Reviewed  [missing teeth, dry lips and mouth]  Skin Reviewed  [dry, diffuse erythema on extremities]  Nails Reviewed       Diet Order:   Diet Order             Diet regular Fluid consistency: Thin; Fluid restriction: 1200 mL Fluid  Diet effective now                   EDUCATION NEEDS:   No education needs have been identified at this time  Skin:  Skin Assessment: Skin Integrity Issues: Skin Integrity Issues:: Other (Comment) Other: skin tears on buttocks  Last BM:  1/23- type 7  Height:   Ht Readings from Last 1 Encounters:  05/25/23 5\' 10"  (1.778 m)    Weight:   Wt Readings from Last 1 Encounters:  05/25/23 57.2 kg    BMI:  There is no height or weight on file to calculate BMI.  Estimated Nutritional Needs:   Kcal:  2200-2400  Protein:  110-120g  Fluid:  Per MD   Tilda Franco, MS, RD, LDN Inpatient Clinical Dietitian Contact via Secure chat

## 2023-07-06 NOTE — Progress Notes (Signed)
  Progress Note   Patient: Richard Andrews ZOX:[id number] DOB: 02/24/1961 DOA: 07/05/2023     1 DOS: the patient was seen and examined on 07/06/2023        Brief hospital course: 63 y.o. 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      Assessment and Plan: * Hypokalemia Hypomagnesemia Hypophosphatemia Refeeding syndrome Phosphorus undetectable this morning, magnesium still low - Continue IV magnesium and phosphorus - Supplement potassium again  Apparent cirrhosis (HCC) Hepatic encephalopathy Patient has some residual confusion (states today he is "at my house") which I think is hepatic encephalopathy rather than DTs - Start lactulose - Hold diuretics for now - Trend platelets and total bilirubin   Alcohol use disorder, moderate, dependence (HCC) Feels he is withdrawing slightly - Continue thiamine and folate - Continue CIWA monitoring and on-demand lorazepam   Generalized weakness TSH normal.  Echo shows preserved ejection fraction, no significant fluid overload, normal valves  Protein-calorie malnutrition, severe (HCC) Refeeding syndrome - Consult dietitian - Continue nutritional supplements - Monitor Phos and mag closely   Chronic subdural hematoma (HCC) Appears stable on CT  High anion gap metabolic acidosis Likely starvation and alcoholic ketosis - Consult dietitian - Trend BMP - Monitor for refeeding syndrome  Hyponatremia -Fluid restriction   Essential hypertension Not adherent to meds at home, BP soft here - Hold home meds  Dark stool Overnight nurse reported to morning nurse patient had dark stool, patient has no recollection of  this. - Check FOBT - Empiric IV PPI for now - Trend HGb             Subjective: Patient is slightly confused still,     Physical Exam: BP 93/63 (BP Location: Right Arm)   Pulse (!) 107   Temp 98.5 F (36.9 C) (Oral)   Resp 16   SpO2 97%   Thin frail adult male, lying in bed, seems dazed and confused Tachycardic, regular, no murmurs, pitting edema of the ankles bilaterally Respiratory rate normal, lungs clear without rales or wheezes Abdomen soft without tenderness palpation or ascites Attention somewhat distracted, affect blunted, psychomotor slowing noted, generalized weakness but symmetric strength, face symmetric, speech fluent     Data Reviewed: BMP shows sodium down to 127, potassium up to 3.5, creatinine stable Phosphorus low Magnesium 1.6 LFTs unchanged CBC shows hemoglobin down to 8.5, platelets stable at 40  Family Communication:     Disposition: Status is: Inpatient         Author: Alberteen Sam, MD 07/06/2023 1:38 PM  For on call review [email or web address].

## 2023-07-06 NOTE — Plan of Care (Signed)

## 2023-07-06 NOTE — Plan of Care (Signed)
  Problem: Education: Goal: Knowledge of General Education information will improve Description: Including pain rating scale, medication(s)/side effects and non-pharmacologic comfort measures Outcome: Progressing   Problem: Health Behavior/Discharge Planning: Goal: Ability to manage health-related needs will improve Outcome: Progressing   Problem: Clinical Measurements: Goal: Ability to maintain clinical measurements within normal limits will improve Outcome: Progressing Goal: Will remain free from infection Outcome: Progressing Goal: Diagnostic test results will improve Outcome: Progressing   Problem: Coping: Goal: Level of anxiety will decrease Outcome: Progressing   Problem: Elimination: Goal: Will not experience complications related to bowel motility Outcome: Progressing Goal: Will not experience complications related to urinary retention Outcome: Progressing   Problem: Pain Managment: Goal: General experience of comfort will improve and/or be controlled Outcome: Progressing   Problem: Safety: Goal: Ability to remain free from injury will improve Outcome: Progressing   Problem: Activity: Goal: Risk for activity intolerance will decrease Outcome: Not Progressing   Problem: Nutrition: Goal: Adequate nutrition will be maintained Outcome: Not Progressing   Problem: Skin Integrity: Goal: Risk for impaired skin integrity will decrease Outcome: Not Progressing

## 2023-07-07 ENCOUNTER — Inpatient Hospital Stay (HOSPITAL_COMMUNITY): Payer: BC Managed Care – PPO

## 2023-07-07 DIAGNOSIS — R509 Fever, unspecified: Secondary | ICD-10-CM | POA: Insufficient documentation

## 2023-07-07 DIAGNOSIS — S2239XA Fracture of one rib, unspecified side, initial encounter for closed fracture: Secondary | ICD-10-CM | POA: Insufficient documentation

## 2023-07-07 DIAGNOSIS — E876 Hypokalemia: Secondary | ICD-10-CM | POA: Diagnosis not present

## 2023-07-07 LAB — CBC
HCT: 25.3 % — ABNORMAL LOW (ref 39.0–52.0)
Hemoglobin: 8.5 g/dL — ABNORMAL LOW (ref 13.0–17.0)
MCH: 34.6 pg — ABNORMAL HIGH (ref 26.0–34.0)
MCHC: 33.6 g/dL (ref 30.0–36.0)
MCV: 102.8 fL — ABNORMAL HIGH (ref 80.0–100.0)
Platelets: 46 10*3/uL — ABNORMAL LOW (ref 150–400)
RBC: 2.46 MIL/uL — ABNORMAL LOW (ref 4.22–5.81)
RDW: 14.7 % (ref 11.5–15.5)
WBC: 2.6 10*3/uL — ABNORMAL LOW (ref 4.0–10.5)
nRBC: 0 % (ref 0.0–0.2)

## 2023-07-07 LAB — RESPIRATORY PANEL BY PCR

## 2023-07-07 LAB — MAGNESIUM: Magnesium: 1.6 mg/dL — ABNORMAL LOW (ref 1.7–2.4)

## 2023-07-07 LAB — COMPREHENSIVE METABOLIC PANEL
ALT: 23 U/L (ref 0–44)
AST: 85 U/L — ABNORMAL HIGH (ref 15–41)
Albumin: 2.8 g/dL — ABNORMAL LOW (ref 3.5–5.0)
Alkaline Phosphatase: 78 U/L (ref 38–126)
Anion gap: 9 (ref 5–15)
BUN: 9 mg/dL (ref 8–23)
CO2: 26 mmol/L (ref 22–32)
Calcium: 8.5 mg/dL — ABNORMAL LOW (ref 8.9–10.3)
Chloride: 94 mmol/L — ABNORMAL LOW (ref 98–111)
Creatinine, Ser: 0.52 mg/dL — ABNORMAL LOW (ref 0.61–1.24)
GFR, Estimated: >60 mL/min (ref >60)
Glucose, Bld: 98 mg/dL (ref 70–99)
Potassium: 3.3 mmol/L — ABNORMAL LOW (ref 3.5–5.1)
Sodium: 129 mmol/L — ABNORMAL LOW (ref 135–145)
Total Bilirubin: 1.7 mg/dL — ABNORMAL HIGH (ref 0.0–1.2)
Total Protein: 6.1 g/dL — ABNORMAL LOW (ref 6.5–8.1)

## 2023-07-07 LAB — CULTURE, BLOOD (ROUTINE X 2)

## 2023-07-07 LAB — PROTIME-INR
INR: 1.3 — ABNORMAL HIGH (ref 0.8–1.2)
Prothrombin Time: 16.7 s — ABNORMAL HIGH (ref 11.4–15.2)

## 2023-07-07 LAB — SARS CORONAVIRUS 2 BY RT PCR: SARS Coronavirus 2 by RT PCR: NEGATIVE

## 2023-07-07 LAB — PHOSPHORUS: Phosphorus: 2.4 mg/dL — ABNORMAL LOW (ref 2.5–4.6)

## 2023-07-07 MED ORDER — K PHOS MONO-SOD PHOS DI & MONO 155-852-130 MG PO TABS
500.0000 mg | ORAL_TABLET | Freq: Three times a day (TID) | ORAL | Status: AC
  Administered 2023-07-07 (×3): 500 mg via ORAL
  Filled 2023-07-07 (×3): qty 2

## 2023-07-07 MED ORDER — MAGNESIUM SULFATE 2 GM/50ML IV SOLN
2.0000 g | Freq: Once | INTRAVENOUS | Status: AC
  Administered 2023-07-07: 2 g via INTRAVENOUS
  Filled 2023-07-07: qty 50

## 2023-07-07 NOTE — Assessment & Plan Note (Signed)
This was noted incidentally in evaluation of right arm pain. Appeared subacute on x-ray.  In conjunction with multiple old healed rib fractures.

## 2023-07-07 NOTE — Assessment & Plan Note (Signed)
positive blood culture New fever overnight 1/24.  Urinalysis 1/22 negative.  CXR clear.  Blood cultures 1/2 positive, but suspected contaminant.  No further fever - Hold antibiotics and monitor fever curve - Repeat blood cultures

## 2023-07-07 NOTE — Progress Notes (Signed)
  Progress Note   Patient: Richard Andrews:[id number] DOB: 12/17/1960 DOA: 07/05/2023     2 DOS: the patient was seen and examined on 07/07/2023 at 8:54AM      Brief hospital course: 63 y.o. 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      Assessment and Plan: * Hypokalemia Hypomagnesemia Hypophosphatemia Refeeding syndrome Phos replete.  Mag, K low.   - Supplement K, mag   Apparent cirrhosis (HCC) Hepatic encephalopathy Encephalopathy seems to be improving, oriented to place and time today.   - Continue lactulose - Hold diuretics for now - Alcohol cessation - Trend Platelets, Tbili  Alcohol use disorder, moderate, dependence (HCC) Has history of alcohol withdrawal seizures in the past and history DTs during last admission  CIWA consistently low here, no withdrawals or DTs. - Continue thiamine and folate - CIWA monitoring and lorazepam PRN  Right lateral 4th rib fracture This was noted incidentally in evaluation of right arm pain. Appeared subacute on x-ray.  In conjunction with multiple old healed rib fractures.    Fever New fever overnight 1/24.  Urinalysis 1/22 negative.  CXR clear.  Blood cultures 1/2 positive, but suspected contaminant. - Hold antibiotics and monitor fever curve  Generalized weakness Echo unremarkable, TSH normal  Protein-calorie malnutrition, severe (HCC) - Consult dietitian  Chronic subdural hematoma (HCC) Appears stable on CT  Positive blood culture Possible contaminant - Hold antibiotics for now - Follow second culture  High anion gap metabolic acidosis Likely starvation and alcoholic ketosis - Consult dietitian -  Trend BMP - Monitor for refeeding syndrome  Hyponatremia Improved from admission.  Stable - Fluid restriction  Macrocytic anemia Due to alcohol use  Thrombocytopenia (HCC) Due to alcohol - Trend platelets  Essential hypertension Not adherent to meds at home, BP soft here          Subjective: Patient febrile overnight.  He is oriented, but denies fever, cough, sore throat, headaches, runny nose, abdominal pain, dysuria.  Nursing of no concerns.     Physical Exam: BP 94/68 (BP Location: Right Arm)   Pulse 71   Temp 98.1 F (36.7 C) (Oral)   Resp 16   Ht 5\' 11"  (1.803 m)   Wt 53.8 kg   SpO2 100%   BMI 16.54 kg/m   Frail elderly adult male, laying in bed, appears disheveled and chronically ill RRR, no murmurs, no peripheral edema Respiratory rate seems normal, lungs clear without rales or wheezes Abdomen soft, no tenderness to palpation, no ascites Attentive to questions, seems to have mild psychomotor slowing, oriented to person, place, month, year, generalized weakness but symmetric strength     Data Reviewed: Chest x-ray, personally reviewed, shows no areas disease based opacity UA negative Blood cultures with 1 of 2 with coag negative staph Respiratory virus panel and COVID panel negative Sodium 129, stable, potassium 3.3, creatinine normal Phosphorus and magnesium slightly low White blood cell count and platelets unchanged, hemoglobin stable    Family Communication: None present    Disposition: Status is: Inpatient         Author: Alberteen Sam, MD 07/07/2023 1:40 PM  For on call review [email or web address].

## 2023-07-07 NOTE — Assessment & Plan Note (Addendum)
Possible contaminant - Hold antibiotics for now - Follow second culture

## 2023-07-07 NOTE — Plan of Care (Signed)

## 2023-07-08 DIAGNOSIS — E876 Hypokalemia: Secondary | ICD-10-CM | POA: Diagnosis not present

## 2023-07-08 LAB — RENAL FUNCTION PANEL
Albumin: 2.6 g/dL — ABNORMAL LOW (ref 3.5–5.0)
Anion gap: 10 (ref 5–15)
BUN: 11 mg/dL (ref 8–23)
CO2: 25 mmol/L (ref 22–32)
Calcium: 8 mg/dL — ABNORMAL LOW (ref 8.9–10.3)
Chloride: 95 mmol/L — ABNORMAL LOW (ref 98–111)
Creatinine, Ser: 0.58 mg/dL — ABNORMAL LOW (ref 0.61–1.24)
GFR, Estimated: >60 mL/min (ref >60)
Glucose, Bld: 116 mg/dL — ABNORMAL HIGH (ref 70–99)
Phosphorus: 2.6 mg/dL (ref 2.5–4.6)
Potassium: 3.7 mmol/L (ref 3.5–5.1)
Sodium: 130 mmol/L — ABNORMAL LOW (ref 135–145)

## 2023-07-08 LAB — CBC
HCT: 24.5 % — ABNORMAL LOW (ref 39.0–52.0)
Hemoglobin: 8.1 g/dL — ABNORMAL LOW (ref 13.0–17.0)
MCH: 34 pg (ref 26.0–34.0)
MCHC: 33.1 g/dL (ref 30.0–36.0)
MCV: 102.9 fL — ABNORMAL HIGH (ref 80.0–100.0)
Platelets: 72 10*3/uL — ABNORMAL LOW (ref 150–400)
RBC: 2.38 MIL/uL — ABNORMAL LOW (ref 4.22–5.81)
RDW: 15.2 % (ref 11.5–15.5)
WBC: 2.6 10*3/uL — ABNORMAL LOW (ref 4.0–10.5)
nRBC: 0 % (ref 0.0–0.2)

## 2023-07-08 LAB — MAGNESIUM: Magnesium: 1.6 mg/dL — ABNORMAL LOW (ref 1.7–2.4)

## 2023-07-08 MED ORDER — MAGNESIUM SULFATE 2 GM/50ML IV SOLN
2.0000 g | Freq: Once | INTRAVENOUS | Status: AC
  Administered 2023-07-08: 2 g via INTRAVENOUS
  Filled 2023-07-08: qty 50

## 2023-07-08 MED ORDER — HYDROXYZINE HCL 25 MG PO TABS
25.0000 mg | ORAL_TABLET | Freq: Once | ORAL | Status: AC
  Administered 2023-07-08: 25 mg via ORAL
  Filled 2023-07-08: qty 1

## 2023-07-08 MED ORDER — FUROSEMIDE 20 MG PO TABS
20.0000 mg | ORAL_TABLET | Freq: Every day | ORAL | Status: DC
  Administered 2023-07-09 – 2023-07-15 (×7): 20 mg via ORAL
  Filled 2023-07-08 (×7): qty 1

## 2023-07-08 NOTE — Plan of Care (Signed)

## 2023-07-08 NOTE — Progress Notes (Signed)
  Progress Note   Patient: Richard Andrews JYN:[id number] DOB: [birth year] DOA: 07/05/2023     3 DOS: the patient was seen and examined on 07/08/2023 at 12:07PM      Brief hospital course: 63 y.o. 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      Assessment and Plan: * Hypokalemia Hypomagnesemia Hypophosphatemia Refeeding syndrome Magnesium persistently low - Continue magnesium  Apparent cirrhosis (HCC) Alcoholic cirrhosis Hepatic encephalopathy Encephalopathy resolved with lactulose. - Continue lactulose - Start diuretics - Alcohol cessation    Alcohol use disorder, moderate, dependence (HCC) Thankfully no withdrawals or DTs here - Continue thiamine and folate  Right lateral 4th rib fracture This was noted incidentally in evaluation of right arm pain. Appeared subacute on x-ray.  In conjunction with multiple old healed rib fractures.    Fever and positive blood culture New fever overnight 1/24.  Urinalysis 1/22 negative.  CXR clear.  Blood cultures 1/2 positive, but suspected contaminant.  No further fever - Hold antibiotics and monitor fever curve - Repeat blood cultures  Generalized weakness Due to malnutrition.  TSH normal, echo unremarkable.  Protein-calorie malnutrition, severe (HCC) - Consult dietitian  Chronic subdural hematoma (HCC) Appears stable on CT   Thrombocytopenia (HCC) Improved today           Subjective: Patient feels okay, appetite is good, no confusion, no nursing concerns, still very weak     Physical Exam: BP 94/64   Pulse 87   Temp 98.9 F (37.2 C) (Oral)   Resp 18   Ht 5\' 11"  (1.803 m)   Wt 53.8 kg   SpO2  100%   BMI 16.54 kg/m   Frail elderly adult male, lying in bed, appears emaciated RRR, no murmurs, 1+ pitting in the ankles Respiratory rate normal, lungs clear without rales or wheezes Diffuse ecchymosis Abdomen soft, no tenderness palpation, no distention or ascites Attentive to questions, affect blunted, psychomotor slowing mild but oriented to person, place, time, and situation, face metric, severe generalized weakness, right arm limited movement    Data Reviewed: Basic metabolic panel shows potassium and phosphorus have improved, magnesium persistently low, renal function normal CBC shows improved platelets, stable hemoglobin    Family Communication: None present    Disposition: Status is: Inpatient Patient is close to medically cleared, if Blood cultures are negative, will plan for dc to SNF Monday        Author: Alberteen Sam, MD 07/08/2023 1:36 PM  For on call review [email or web address].

## 2023-07-09 DIAGNOSIS — E876 Hypokalemia: Secondary | ICD-10-CM | POA: Diagnosis not present

## 2023-07-09 DIAGNOSIS — D61818 Other pancytopenia: Secondary | ICD-10-CM | POA: Insufficient documentation

## 2023-07-09 LAB — BASIC METABOLIC PANEL
Anion gap: 8 (ref 5–15)
BUN: 9 mg/dL (ref 8–23)
CO2: 23 mmol/L (ref 22–32)
Calcium: 8.7 mg/dL — ABNORMAL LOW (ref 8.9–10.3)
Chloride: 100 mmol/L (ref 98–111)
Creatinine, Ser: 0.47 mg/dL — ABNORMAL LOW (ref 0.61–1.24)
GFR, Estimated: >60 mL/min (ref >60)
Glucose, Bld: 138 mg/dL — ABNORMAL HIGH (ref 70–99)
Potassium: 3.8 mmol/L (ref 3.5–5.1)
Sodium: 131 mmol/L — ABNORMAL LOW (ref 135–145)

## 2023-07-09 LAB — CBC
HCT: 28.3 % — ABNORMAL LOW (ref 39.0–52.0)
Hemoglobin: 9.2 g/dL — ABNORMAL LOW (ref 13.0–17.0)
MCH: 34.5 pg — ABNORMAL HIGH (ref 26.0–34.0)
MCHC: 32.5 g/dL (ref 30.0–36.0)
MCV: 106 fL — ABNORMAL HIGH (ref 80.0–100.0)
Platelets: 101 10*3/uL — ABNORMAL LOW (ref 150–400)
RBC: 2.67 MIL/uL — ABNORMAL LOW (ref 4.22–5.81)
RDW: 15.7 % — ABNORMAL HIGH (ref 11.5–15.5)
WBC: 3 10*3/uL — ABNORMAL LOW (ref 4.0–10.5)
nRBC: 0 % (ref 0.0–0.2)

## 2023-07-09 NOTE — Plan of Care (Signed)

## 2023-07-09 NOTE — Assessment & Plan Note (Signed)
Due to alcohol.  Counts improving - Repeat CBC in 1 week

## 2023-07-09 NOTE — Progress Notes (Signed)
  Progress Note   Patient: Richard Andrews ZOX:[id number] DOB: 03-06-1961 DOA: 07/05/2023     4 DOS: the patient was seen and examined on 07/09/2023 at 10:37AM      Brief hospital course: 63 y.o. 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      Assessment and Plan: * Hypokalemia Hypomagnesemia Hypophosphatemia Phos replete.  Mag, K low.   - Supplement K, mag   Starvation and alcoholic ketosis Refeeding syndrome Resolved  Apparent cirrhosis (HCC) Alcoholic cirrhosis Hepatic encephalopathy Encephalopathy resolved - Continue lactulose - Continue new Lasix - Alcohol cessation - GI follow-up as an outpatient  Alcohol use disorder, moderate, dependence (HCC) Has history of alcohol withdrawal seizures in the past and history DTs during last admission  Thankfully no withdrawals or DTs here - Continue thiamine and folate  Pancytopenia (HCC) Due to alcohol.  Counts improving - Repeat CBC in 1 week   Right lateral 4th rib fracture This was noted incidentally in evaluation of right arm pain. Appeared subacute on x-ray.  In conjunction with multiple old healed rib fractures.    Fever positive blood culture New fever overnight 1/24.  Urinalysis 1/22 negative.  CXR clear.  Blood cultures 1/2 positive, but suspected contaminant.  No further fever - Hold antibiotics and monitor fever curve - Repeat blood cultures  Generalized weakness Echo unremarkable, TSH normal  Protein-calorie malnutrition, severe (HCC) - Consult dietitian  Chronic subdural hematoma (HCC) Appears stable on CT  Hyponatremia Improved from admission.  Stable - Fluid  restriction  Macrocytic anemia Due to alcohol use  Thrombocytopenia (HCC) Due to alcohol - Trend platelets  Essential hypertension Not adherent to meds at home, BP soft here          Subjective: Patient is feeling better, no fever, no confusion, no respiratory symptoms, no urinary symptoms.  No nursing concerns.     Physical Exam: BP 102/69 (BP Location: Right Arm)   Pulse 81   Temp 98.9 F (37.2 C) (Oral)   Resp 18   Ht 5\' 11"  (1.803 m)   Wt 53.8 kg   SpO2 100%   BMI 16.54 kg/m   Frail elderly adult male, living in bed, appears weak and disheveled and chronically ill RRR, no murmurs, no peripheral edema Respiratory rate normal, lungs clear without rales or wheezes Abdomen soft, no ascites, no distention Attentive to questions, oriented to person, place, and time, flat affect but no psychomotor slowing, generalized weakness, strength in the upper extremities limited by joint pain    Data Reviewed: Basic metabolic panel shows mild hyponatremia, resolved acidosis CBC shows improving pancytopenia  Family Communication: Spoke to daughter last night at the bedside    Disposition: Status is: Inpatient Patient was admitted with starvation ketosis, had refeeding syndrome, and is now improved  He needs significant rehabilitation return to his prior level of care, with pending SNF tomorrow        Author: Alberteen Sam, MD 07/09/2023 2:15 PM  For on call review [email or web address].

## 2023-07-10 DIAGNOSIS — E876 Hypokalemia: Secondary | ICD-10-CM | POA: Diagnosis not present

## 2023-07-10 LAB — CULTURE, BLOOD (ROUTINE X 2): Culture: NO GROWTH

## 2023-07-10 NOTE — TOC Initial Note (Addendum)
Transition of Care [facility]) - Initial/Assessment Note    Patient Details  Name: Richard Andrews MRN: [id number] Date of Birth: 1961-04-02  Transition [facility]) CM/SW Contact:    Beckie Busing, RN Phone [phone]  07/10/2023, 8:56 AM  Clinical Narrative:                 CM at bedside to discuss SNF placement for short term rehab. Patient states that he is still thinking about it but has not made up his mind. Patient states that he does have someone to assist at home. Lives with spouse and brother in law. Patient states that he is wheelchair bound and is able to manage and function independently.  CM has requested permission to initiate bed search but patient does not give permission. Patient agrees to update CM if he decides to go to SNF. Message sent to MD.         Patient Goals and CMS Choice            Expected Discharge Plan and Services                                              Prior Living Arrangements/Services                       Activities of Daily Living   ADL Screening (condition at time of admission) Independently performs ADLs?: No Does the patient have a NEW difficulty with bathing/dressing/toileting/self-feeding that is expected to last >3 days?: Yes (Initiates electronic notice to provider for possible OT consult) Does the patient have a NEW difficulty with getting in/out of bed, walking, or climbing stairs that is expected to last >3 days?: Yes (Initiates electronic notice to provider for possible PT consult) Does the patient have a NEW difficulty with communication that is expected to last >3 days?: No Is the patient deaf or have difficulty hearing?: No Does the patient have difficulty seeing, even when wearing glasses/contacts?: No Does the patient have difficulty concentrating, remembering, or making decisions?: No  Permission Sought/Granted                  Emotional Assessment              Admission  diagnosis:  Dehydration [E86.0] Hypokalemia [E87.6] Alcohol abuse [F10.10] Hyponatremia [E87.1] Hypophosphatemia [E83.39] Thrombocytopenia (HCC) [D69.6] Generalized weakness [R53.1] Anemia, unspecified type [D64.9] Patient Active Problem List   Diagnosis Date Noted   Pancytopenia (HCC) 07/09/2023   Fever 07/07/2023   Right lateral 4th rib fracture 07/07/2023   Hypophosphatemia 07/05/2023   Apparent cirrhosis (HCC) 07/05/2023   Protein-calorie malnutrition, severe (HCC) 07/05/2023   Generalized weakness 07/05/2023   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   Kidney stone 02/16/2023   Colitis due to Clostridioides difficile 01/25/2023   History of seizure due to alcohol withdrawal 01/23/2023   History of bleeding peptic ulcer 01/23/2023   Hyponatremia 01/22/2023   Lactic acidosis 01/22/2023   Hypokalemia 01/22/2023   Hypomagnesemia 01/22/2023   Physical deconditioning 01/22/2023   Hyperbilirubinemia 01/22/2023   Hepatic steatosis 01/22/2023   SDH (subdural hematoma) (HCC) 01/22/2023   Starvation and alcoholic ketosis 06/15/7251   Closed nondisplaced fracture of left tibial plateau 11/21/2022   ICH (intracerebral hemorrhage) (HCC) 11/15/2022   Subdural hematoma (HCC) 10/07/2022   Fall 09/24/2022   Alcohol withdrawal syndrome without  complication (HCC)    Gastritis and gastroduodenitis    Multiple gastric ulcers    Duodenal ulcer    Acute blood loss anemia    Alcohol withdrawal seizure with complication (HCC)    GI bleed 01/19/2021   QT prolongation 01/19/2021   Electrolyte imbalance 02/06/2020   Macrocytic anemia 02/06/2020   Acute pain of right wrist 02/06/2020   Encounter for orthopedic follow-up care 01/28/2020   Open Colles' fracture of right radius 01/10/2020   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 [name] [address] Phone: [phone] Fax: [phone]  [name] [address] [name] [address] Phone: [phone] Fax: [phone]     Social Drivers of Health (SDOH) Social History: SDOH Screenings   Food Insecurity: Food Insecurity Present (07/06/2023)  Housing: Unknown (07/06/2023)  Transportation Needs: No Transportation Needs (07/06/2023)  Utilities: Not At Risk (07/06/2023)  Depression (PHQ2-9): Low Risk  (03/07/2023)  Social Connections: Socially Isolated (07/06/2023)  Tobacco Use: High Risk (07/05/2023)   SDOH Interventions:     Readmission Risk Interventions     No data to display

## 2023-07-10 NOTE — Progress Notes (Signed)
Physical Therapy Treatment Patient Details Name: Richard Andrews MRN: [id number] DOB: [birth year] Today's Date: 07/10/2023   History of Present Illness Richard Andrews is 63 yo male admitted on 07/05/23 after being found down in his own feces with weakness, hypokalemis, hypophosphatemia, and alcoholic/starvation ketosis.  Incidentally noted to have R lateral 4th rib fx.  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  Richard Andrews was able to ambulate 120' with RW and min A for several loss of balances.  Richard Andrews needed mod A to stand from chair.  Continue to recommend Patient will benefit from continued inpatient follow up therapy, <3 hours/day at d/c due to poor safety, assist required, and fall risk.     If plan is discharge home, recommend the following: A little help with walking and/or transfers;A little help with bathing/dressing/bathroom;Assistance with cooking/housework;Help with stairs or ramp for entrance   Can travel by private vehicle     Yes  Equipment Recommendations  None recommended by Richard Andrews    Recommendations for Other Services       Precautions / Restrictions Precautions Precautions: Fall     Mobility  Bed Mobility Overal bed mobility: Needs Assistance Bed Mobility: Supine to Sit, Sit to Supine   Sidelying to sit: Supervision Supine to sit: Supervision          Transfers Overall transfer level: Needs assistance Equipment used: Rolling walker (2 wheels) Transfers: Sit to/from Stand Sit to Stand: Mod assist, Min assist           General transfer comment: Min A from elevated bed; mod A from chair; performed STS x 3    Ambulation/Gait Ambulation/Gait assistance: Min assist Gait Distance (Feet): 120 Feet Assistive device: Rolling walker (2 wheels) Gait Pattern/deviations: Step-through pattern, Decreased stride length, Drifts right/left,  Scissoring Gait velocity: decreased     General Gait Details: Richard Andrews needing cues for RW proximity; scissoring at times needing min A for balance   Stairs             Wheelchair Mobility     Tilt Bed    Modified Rankin (Stroke Patients Only)       Balance Overall balance assessment: Needs assistance Sitting-balance support: Feet supported, No upper extremity supported Sitting balance-Leahy Scale: Good     Standing balance support: Bilateral upper extremity supported, Reliant on assistive device for balance Standing balance-Leahy Scale: Poor Standing balance comment: Needs RW and min A; initial posterior lean but improved                            Cognition Arousal: Alert Behavior During Therapy: Flat affect Overall Cognitive Status: Impaired/Different from baseline Area of Impairment: Orientation, Awareness, Memory, Problem solving, Following commands, Safety/judgement                 Orientation Level: Time, Situation, Place   Memory: Decreased short-term memory Following Commands: Follows one step commands consistently Safety/Judgement: Decreased awareness of safety Awareness: Emergent Problem Solving: Slow processing, Difficulty sequencing, Decreased initiation, Requires verbal cues          Exercises General Exercises - Lower Extremity Ankle Circles/Pumps: AROM, Both, 10 reps, Supine Quad Sets: AROM, Both, 10 reps, Supine Long Arc Quad: AROM, Both, 10 reps, Seated Heel Slides: AROM, Both, 10 reps, Supine Hip Flexion/Marching: AROM, Both, 10 reps,  Seated Other Exercises Other Exercises: Cues for correct form with all exercises    General Comments General comments (skin integrity, edema, etc.): VSS      Pertinent Vitals/Pain Pain Assessment Pain Assessment: No/denies pain    Home Living                          Prior Function            Richard Andrews Goals (current goals can now be found in the care plan section) Acute  Rehab Richard Andrews Goals Patient Stated Goal: return home Richard Andrews Goal Formulation: With patient Time For Goal Achievement: 07/24/23 Potential to Achieve Goals: Good Progress towards Richard Andrews goals: Goals met and updated - see care plan    Frequency    Min 1X/week      Richard Andrews Plan      Co-evaluation              AM-PAC Richard Andrews "6 Clicks" Mobility   Outcome Measure  Help needed turning from your back to your side while in a flat bed without using bedrails?: A Little Help needed moving from lying on your back to sitting on the side of a flat bed without using bedrails?: A Little Help needed moving to and from a bed to a chair (including a wheelchair)?: A Little Help needed standing up from a chair using your arms (e.g., wheelchair or bedside chair)?: A Lot Help needed to walk in hospital room?: A Lot (increased cues) Help needed climbing 3-5 steps with a railing? : A Lot 6 Click Score: 15    End of Session Equipment Utilized During Treatment: Gait belt Activity Tolerance: Patient tolerated treatment well Patient left: with call bell/phone within reach;with chair alarm set;in chair Nurse Communication: Mobility status Richard Andrews Visit Diagnosis: Unsteadiness on feet (R26.81);Muscle weakness (generalized) (M62.81)     Time: 5638-7564 Richard Andrews Time Calculation (min) (ACUTE ONLY): 23 min  Charges:    $Gait Training: 8-22 mins $Therapeutic Exercise: 8-22 mins Richard Andrews General Charges $$ ACUTE Richard Andrews VISIT: 1 Visit                     Anise Salvo, Richard Andrews Acute Rehab [facility] Rehab [phone]    Rayetta Humphrey 07/10/2023, 5:14 PM

## 2023-07-10 NOTE — TOC Progression Note (Signed)
Transition of Care [facility]) - Progression Note    Patient Details  Name: Richard Andrews MRN: [id number] Date of Birth: [birth year]  [facility]) CM/SW Contact  Howell Rucks, RN Phone Number: 07/10/2023, 2:19 PM  Clinical Narrative:  Attending MD and NCM met with pt at bedside, NCM introduced role of TOC/NCM, pt now agreeable to short term rehab/SNF. FL2 updated, faxed out for bed offers.       Expected Discharge Plan: Skilled Nursing Facility (SNF recommendation currently refusing) Barriers to Discharge: Continued Medical Work up  Expected Discharge Plan and Services In-house Referral: NA Discharge Planning Services: CM Consult Post Acute Care Choice: Skilled Nursing Facility (Refused) Living arrangements for the past 2 months: Single Family Home Expected Discharge Date: 07/10/23               DME Arranged: N/A DME Agency: NA                   Social Determinants of Health (SDOH) Interventions SDOH Screenings   Food Insecurity: Food Insecurity Present (07/06/2023)  Housing: Unknown (07/06/2023)  Transportation Needs: No Transportation Needs (07/06/2023)  Utilities: Not At Risk (07/06/2023)  Depression (PHQ2-9): Low Risk  (03/07/2023)  Social Connections: Socially Isolated (07/06/2023)  Tobacco Use: High Risk (07/05/2023)    Readmission Risk Interventions     No data to display

## 2023-07-10 NOTE — [place] FL2 (Signed)
[place] MEDICAID FL2 LEVEL OF CARE FORM     IDENTIFICATION  Patient Name: Richard Andrews Birthdate: 1960/11/21 Sex: male Admission Date (Current Location): 07/05/2023  [facility] and [place] Number:  Producer, television/film/video and Address:  [facility],  [address], [place] [address]      Provider Number: [id number]  Attending Physician Name and Address:  Alberteen Sam, *  Relative Name and Phone Number:  Moser,Christina (Spouse)  [phone] (Mobile)    Current Level of Care: Hospital Recommended Level of Care: Skilled Nursing Facility Prior Approval Number:    Date Approved/Denied:   PASRR Number: [id number]  Discharge Plan: Home    Current Diagnoses: Patient Active Problem List   Diagnosis Date Noted   Pancytopenia (HCC) 07/09/2023   Fever 07/07/2023   Right lateral 4th rib fracture 07/07/2023   Hypophosphatemia 07/05/2023   Apparent cirrhosis (HCC) 07/05/2023   Protein-calorie malnutrition, severe (HCC) 07/05/2023   Generalized weakness 07/05/2023   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   Kidney stone 02/16/2023   Colitis due to Clostridioides difficile 01/25/2023   History of seizure due to alcohol withdrawal 01/23/2023   History of bleeding peptic ulcer 01/23/2023   Hyponatremia 01/22/2023   Lactic acidosis 01/22/2023   Hypokalemia 01/22/2023   Hypomagnesemia 01/22/2023   Physical deconditioning 01/22/2023   Hyperbilirubinemia 01/22/2023   Hepatic steatosis 01/22/2023   SDH (subdural hematoma) (HCC) 01/22/2023   Starvation and alcoholic ketosis 87/56/4332   Closed nondisplaced fracture of left tibial plateau 11/21/2022   ICH (intracerebral hemorrhage) (HCC) 11/15/2022   Subdural hematoma (HCC) 10/07/2022   Fall 09/24/2022   Alcohol withdrawal syndrome without complication (HCC)    Gastritis and gastroduodenitis    Multiple gastric ulcers    Duodenal ulcer    Acute blood loss anemia    Alcohol withdrawal seizure with  complication (HCC)    GI bleed 01/19/2021   QT prolongation 01/19/2021   Electrolyte imbalance 02/06/2020   Macrocytic anemia 02/06/2020   Acute pain of right wrist 02/06/2020   Encounter for orthopedic follow-up care 01/28/2020   Open Colles' fracture of right radius 01/10/2020   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   Increased ammonia level 12/21/2016   Thrombocytopenia (HCC) 12/21/2016   Macrocytosis 03/21/2016   Elevated lipase 03/21/2016   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   Alcohol use disorder, moderate, dependence (HCC) 02/12/2016   Essential hypertension 01/22/2015    Orientation RESPIRATION BLADDER Height & Weight     Self, Time, Situation, Place  Normal Incontinent, External catheter Weight: 53.8 kg Height:  5\' 11"  (180.3 cm)  BEHAVIORAL SYMPTOMS/MOOD NEUROLOGICAL BOWEL NUTRITION STATUS      Incontinent Diet  AMBULATORY STATUS COMMUNICATION OF NEEDS Skin   Extensive Assist Verbally Other (Comment) (Right and Left buttocks tear, foam dressing, change prn)                       Personal Care Assistance Level of Assistance  Bathing, Feeding, Dressing Bathing Assistance: Maximum assistance Feeding assistance: Maximum assistance Dressing Assistance: Maximum assistance     Functional Limitations Info  Sight, Hearing, Speech Sight Info: Impaired Hearing Info: Adequate Speech Info: Adequate    SPECIAL CARE FACTORS FREQUENCY  PT (By licensed PT), OT (By licensed OT)     PT Frequency: 5x/wk OT Frequency: 5x/wk            Contractures Contractures Info: Not present    Additional Factors Info  Code Status, Allergies, Psychotropic Code Status Info: DNR Allergies  Info: NKA Psychotropic Info: N/A         Current Medications (07/10/2023):  This is the current hospital active medication list Current Facility-Administered Medications  Medication Dose Route Frequency Provider Last Rate Last Admin   feeding supplement (ENSURE ENLIVE / ENSURE PLUS) liquid 237  mL  237 mL Oral TID BM Danford, Earl Lites, MD   237 mL at 07/10/23 1211   folic acid (FOLVITE) tablet 1 mg  1 mg Oral Daily Danford, Earl Lites, MD   1 mg at 07/10/23 1014   furosemide (LASIX) tablet 20 mg  20 mg Oral Daily Danford, Earl Lites, MD   20 mg at 07/10/23 1014   lactulose (CHRONULAC) 10 GM/15ML solution 20 g  20 g Oral BID Alberteen Sam, MD   20 g at 07/09/23 0945   magnesium oxide (MAG-OX) tablet 400 mg  400 mg Oral Daily Alberteen Sam, MD   400 mg at 07/10/23 1015   multivitamin with minerals tablet 1 tablet  1 tablet Oral Daily Alberteen Sam, MD   1 tablet at 07/10/23 1014   thiamine (VITAMIN B1) tablet 100 mg  100 mg Oral Daily Alberteen Sam, MD   100 mg at 07/10/23 1014     Discharge Medications: Please see discharge summary for a list of discharge medications.  Relevant Imaging Results:  Relevant Lab Results:   Additional Information SSN: [id number]  Howell Rucks, RN

## 2023-07-10 NOTE — Progress Notes (Signed)
  Progress Note   Patient: Richard Andrews BJY:[id number] DOB: 1961/04/28 DOA: 07/05/2023     5 DOS: the patient was seen and examined on 07/10/2023        Brief hospital course: 63 y.o. 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      Assessment and Plan: * Hypokalemia Hypomagnesemia Hypophosphatemia Phos replete.  Mag, K low.   - Supplement K, mag   Starvation and alcoholic ketosis Refeeding syndrome Resolved  Apparent cirrhosis (HCC) Alcoholic cirrhosis Hepatic encephalopathy Encephalopathy resolved - Continue lactulose - Continue new Lasix - Alcohol cessation - GI follow-up as an outpatient  Alcohol use disorder, moderate, dependence (HCC) Has history of alcohol withdrawal seizures in the past and history DTs during last admission  Thankfully no withdrawals or DTs here - Continue thiamine and folate  Pancytopenia (HCC) Due to alcohol.  Counts improving - Repeat CBC in 1 week   Right lateral 4th rib fracture This was noted incidentally in evaluation of right arm pain. Appeared subacute on x-ray.  In conjunction with multiple old healed rib fractures.    Fever positive blood culture New fever overnight 1/24.  Urinalysis 1/22 negative.  CXR clear.  Blood cultures 1/2 positive, but suspected contaminant.  No further fever - Hold antibiotics and monitor fever curve - Repeat blood cultures  Generalized weakness Echo unremarkable, TSH normal  Protein-calorie malnutrition, severe (HCC) - Consult dietitian  Chronic subdural hematoma (HCC) Appears stable on CT  Hyponatremia Improved from admission.  Stable - Fluid restriction  Macrocytic  anemia Due to alcohol use  Thrombocytopenia (HCC) Due to alcohol - Trend platelets  Essential hypertension Not adherent to meds at home, BP soft here          Subjective: NO new complaints, no nursing concerns     Physical Exam: BP 106/74 (BP Location: Right Arm)   Pulse 73   Temp 98.6 F (37 C) (Oral)   Resp 20   Ht 5\' 11"  (1.803 m)   Wt 53.8 kg   SpO2 100%   BMI 16.54 kg/m   Frail elderly male, sitting up in bed, appears weak and disheveled RRR, no murmurs, no peripheral edema He has scattered ecchymoses Respiratory rate normal, lungs clear without rales or wheezes Abdomen soft, no ascites or distention Attentive to questions, oriented to person, place, and time, generally weak, but no tremor or signs of withdrawal    Data Reviewed: No new labs  Family Communication: Wife by phone    Disposition: Status is: Inpatient Patient was admitted with starvation ketosis, had refeeding syndrome, and is now improved  He needs significant rehabilitation return to his prior level of care, with pending SNF tomorrow        Author: Alberteen Sam, MD 07/10/2023 5:49 PM  For on call review [email or web address].

## 2023-07-10 NOTE — Progress Notes (Signed)
Chaplain met with Tyshun to provide information on advance directives.  Chaplain brought him the paperwork and reviewed it with him.  He stated that he is not ready to do this right now.  Chaplain assessed for other spiritual and emotional needs.  None noted.  [address], [place] Pager, [phone]

## 2023-07-11 DIAGNOSIS — E876 Hypokalemia: Secondary | ICD-10-CM | POA: Diagnosis not present

## 2023-07-11 NOTE — Progress Notes (Signed)
  Progress Note   Patient: Richard Andrews ZOX:[id number] DOB: 12-24-1960 DOA: 07/05/2023     6 DOS: the patient was seen and examined on 07/11/2023 at 9:45AM      Brief hospital course: 63 y.o. 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      Assessment and Plan: * Hypokalemia Hypomagnesemia Hypophosphatemia Generalized weakness due to electrolytes, ketosis, refeeding syndrome Treated and resolved.  Echo unremarkable, TSH normal  Starvation and alcoholic ketosis Refeeding syndrome Treated and resolved.  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  Lactulose started, now resolved. - Continue new lactulose and Lasix  - Alcohol cessation - Needs GI follow-up as an outpatient  Pancytopenia (HCC) Due to alcohol.  Counts improving - Repeat CBC in 1 week  Right lateral 4th rib fracture This was noted incidentally in evaluation of right arm pain. Appeared subacute on x-ray.  In conjunction with multiple old healed rib fractures.    Fever positive blood culture New fever overnight 1/24.  Urinalysis 1/22 negative.  CXR clear.  Blood cultures 1/2 positive, but suspected contaminant.  No further fever, no antibiotics given, now feels well.     Protein-calorie malnutrition, severe (HCC) - Consult dietitian -  Continue supplements  Chronic subdural hematoma (HCC) CT imaging on admission showed no change  Hyponatremia Stable, asymptomatic.  Pancytopenia Due to alcohol use  Essential hypertension Not adherent to meds at home, BP soft here          Subjective: No new complaints, not reliably able to get out of bed with PT/OT.  No new fever, no new cough, abdominal pain.  Appetite good.     Physical Exam: BP 98/72 (BP Location: Left Arm)   Pulse 77   Temp 98.8 F (37.1 C) (Oral)   Resp 15   Ht 5\' 11"  (1.803 m)   Wt 53.8 kg   SpO2 100%   BMI 16.54 kg/m   Thin adult male, lying in bed, interactive and appropriate RRR, no murmurs, no peripheral edema Respiratory rate normal, lungs clear without rales or wheezes Abdomen soft no tenderness palpation or guarding, no ascites or distention Attention normal, affect flat, judgment and insight appear poor, severe generalized weakness, severe loss of subcutaneous muscle mass and fat    Data Reviewed: Basic metabolic panel shows normal potassium and creatinine.  Persistent hyponatremia, stable Persistent pancytopenia, platelets improving  Family Communication: Wife by phone    Disposition: Status is: Inpatient The patient was admitted with effects of alcohol (withdrawal, starvation, electrolytes abnormality)  These are all resolved, and he is very weak and needs rehabilitation to return to his PLOF (lives with brother in law only, has limiated help with mobility)        Author: Alberteen Sam, MD 07/11/2023 2:38 PM  For on call review [email or web address].

## 2023-07-11 NOTE — Progress Notes (Signed)
Occupational Therapy Treatment Patient Details Name: Richard Andrews MRN: [id number] DOB: March 23, 1961 Today's Date: 07/11/2023   History of present illness Pt is 63 yo male admitted on 07/05/23 after being found down in his own feces with weakness, hypokalemis, hypophosphatemia, and alcoholic/starvation ketosis.  Incidentally noted to have R lateral 4th rib fx.  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 Pt in bed feeding self upon arrival. Pt sat EOB with Sup and sat EOB ~8 minutes for ADL tasks with fair- sitting balance. Pt stood from EOB to RW min A, transferred to [facility] and chair but declined to remain in recliner.       If plan is discharge home, recommend the following:  A lot of help with walking and/or transfers;Assistance with cooking/housework;Assist for transportation;A lot of help with bathing/dressing/bathroom;Supervision due to cognitive status;Help with stairs or ramp for entrance   Equipment Recommendations  Other (comment) (TBD at next venue of care)    Recommendations for Other Services      Precautions / Restrictions Precautions Precautions: Fall Precaution Comments: incontinent Restrictions Weight Bearing Restrictions Per Provider Order: No       Mobility Bed Mobility Overal bed mobility: Needs Assistance Bed Mobility: Supine to Sit, Sit to Supine     Supine to sit: Supervision Sit to supine: Contact guard assist   General bed mobility comments: increased time to initiate and complete, no physical assist required    Transfers Overall transfer level: Needs assistance Equipment used: Rolling walker (2 wheels) Transfers: Sit to/from Stand, Bed to chair/wheelchair/BSC Sit to Stand: Min assist                 Balance Overall balance assessment: Needs assistance Sitting-balance support: Feet supported, No upper  extremity supported Sitting balance-Leahy Scale: Good     Standing balance support: Bilateral upper extremity supported, Reliant on assistive device for balance, During functional activity Standing balance-Leahy Scale: Poor                             ADL either performed or assessed with clinical judgement   ADL Overall ADL's : Needs assistance/impaired     Grooming: Wash/dry hands;Wash/dry face;Contact guard assist;Standing           Upper Body Dressing : Minimal assistance;Sitting       Toilet Transfer: Minimal assistance;Ambulation;Rolling walker (2 wheels);BSC/3in1;Cueing for safety   Toileting- Clothing Manipulation and Hygiene: Moderate assistance;Sit to/from stand       Functional mobility during ADLs: Minimal assistance;Rolling walker (2 wheels);Cueing for safety General ADL Comments: pt sat EOB ~8 minutes for functional takss with Fair- sitting balance    Extremity/Trunk Assessment Upper Extremity Assessment Upper Extremity Assessment: Generalized weakness;RUE deficits/detail RUE Deficits / Details: h/o shoulder surgery with ROM limited to ~80-90degrees and gross strength about 3+/5       Cervical / Trunk Assessment Cervical / Trunk Assessment: Kyphotic    Vision Baseline Vision/History: 0 No visual deficits Ability to See in Adequate Light: 0 Adequate     Perception     Praxis      Cognition Arousal: Alert Behavior During Therapy: Flat affect Overall Cognitive Status: Impaired/Different from baseline Area of Impairment: Orientation, Awareness, Memory, Problem solving, Following commands, Safety/judgement  Memory: Decreased short-term memory Following Commands: Follows multi-step commands with increased time Safety/Judgement: Decreased awareness of safety   Problem Solving: Slow processing, Difficulty sequencing, Decreased initiation, Requires verbal cues          Exercises      Shoulder  Instructions       General Comments      Pertinent Vitals/ Pain       Pain Assessment Pain Assessment: Faces Pain Score: 0-No pain Faces Pain Scale: Hurts a little bit Pain Location: "all over" Pain Descriptors / Indicators: Aching Pain Intervention(s): Monitored during session, Repositioned  Home Living                                          Prior Functioning/Environment              Frequency  Min 1X/week        Progress Toward Goals  OT Goals(current goals can now be found in the care plan section)        Plan      Co-evaluation                 AM-PAC OT "6 Clicks" Daily Activity     Outcome Measure   Help from another person eating meals?: None Help from another person taking care of personal grooming?: A Little Help from another person toileting, which includes using toliet, bedpan, or urinal?: A Lot Help from another person bathing (including washing, rinsing, drying)?: A Lot Help from another person to put on and taking off regular upper body clothing?: A Little Help from another person to put on and taking off regular lower body clothing?: A Lot 6 Click Score: 16    End of Session Equipment Utilized During Treatment: Gait belt;Rolling walker (2 wheels);Other (comment) (BSC)  OT Visit Diagnosis: Unsteadiness on feet (R26.81);Dizziness and giddiness (R42);Other abnormalities of gait and mobility (R26.89);Pain;History of falling (Z91.81);Other symptoms and signs involving cognitive function;Ataxia, unspecified (R27.0) Pain - part of body:  ("all over")   Activity Tolerance Patient tolerated treatment well   Patient Left in bed;with call bell/phone within reach;with bed alarm set   Nurse Communication          Time: 1610-9604 OT Time Calculation (min): 18 min  Charges: OT General Charges $OT Visit: 1 Visit OT Treatments $Self Care/Home Management : 8-22 mins    Galen Manila 07/11/2023, 1:10 PM

## 2023-07-11 NOTE — Progress Notes (Signed)
Mobility Specialist - Progress Note   07/11/23 1602  Mobility  Activity Ambulated with assistance in hallway  Level of Assistance Contact guard assist, steadying assist  Assistive Device Front wheel walker  Distance Ambulated (ft) 275 ft  Activity Response Tolerated well  Mobility Referral Yes  Mobility visit 1 Mobility  Mobility Specialist Start Time (ACUTE ONLY) 1540  Mobility Specialist Stop Time (ACUTE ONLY) 1555  Mobility Specialist Time Calculation (min) (ACUTE ONLY) 15 min   Pt received in bed and agreeable to mobility. Pt able to stand independently but CG during ambulation d/t unsteadiness. No complaints during session. Pt to bed after session with all needs met.  [facility]

## 2023-07-11 NOTE — TOC Progression Note (Signed)
Transition of Care [facility]) - Progression Note    Patient Details  Name: Richard Andrews MRN: [id number] Date of Birth: [birth year]  [facility]) CM/SW Contact  Howell Rucks, RN Phone Number: 07/11/2023, 4:08 PM  Clinical Narrative:   Met with pt at bedside to review short term rehab bed offers [facility], [facility], [facility]), pt accepted bed offer at [facility]). Text sent to [facility], admissions coordinator, notified of accepted bed offer and requested initiation of insurance auth.     Expected Discharge Plan: Skilled Nursing Facility (SNF recommendation currently refusing) Barriers to Discharge: Continued Medical Work up  Expected Discharge Plan and Services In-house Referral: NA Discharge Planning Services: CM Consult Post Acute Care Choice: Skilled Nursing Facility (Refused) Living arrangements for the past 2 months: Single Family Home Expected Discharge Date: 07/10/23               DME Arranged: N/A DME Agency: NA                   Social Determinants of Health (SDOH) Interventions SDOH Screenings   Food Insecurity: Food Insecurity Present (07/06/2023)  Housing: Unknown (07/06/2023)  Transportation Needs: No Transportation Needs (07/06/2023)  Utilities: Not At Risk (07/06/2023)  Depression (PHQ2-9): Low Risk  (03/07/2023)  Social Connections: Socially Isolated (07/06/2023)  Tobacco Use: High Risk (07/05/2023)    Readmission Risk Interventions    07/11/2023    4:08 PM  Readmission Risk Prevention Plan  Transportation Screening Complete  PCP or Specialist Appt within 3-5 Days Complete  HRI or Home Care Consult Complete  Social Work Consult for Recovery Care Planning/Counseling Complete  Palliative Care Screening Not Applicable  Medication Review Oceanographer) Complete

## 2023-07-12 DIAGNOSIS — E876 Hypokalemia: Secondary | ICD-10-CM | POA: Diagnosis not present

## 2023-07-12 LAB — BASIC METABOLIC PANEL
Anion gap: 12 (ref 5–15)
BUN: 13 mg/dL (ref 8–23)
CO2: 23 mmol/L (ref 22–32)
Calcium: 8.7 mg/dL — ABNORMAL LOW (ref 8.9–10.3)
Chloride: 94 mmol/L — ABNORMAL LOW (ref 98–111)
Creatinine, Ser: 0.32 mg/dL — ABNORMAL LOW (ref 0.61–1.24)
GFR, Estimated: >60 mL/min (ref >60)
Glucose, Bld: 95 mg/dL (ref 70–99)
Potassium: 3.4 mmol/L — ABNORMAL LOW (ref 3.5–5.1)
Sodium: 129 mmol/L — ABNORMAL LOW (ref 135–145)

## 2023-07-12 LAB — CBC
HCT: 26.5 % — ABNORMAL LOW (ref 39.0–52.0)
Hemoglobin: 8.6 g/dL — ABNORMAL LOW (ref 13.0–17.0)
MCH: 34.1 pg — ABNORMAL HIGH (ref 26.0–34.0)
MCHC: 32.5 g/dL (ref 30.0–36.0)
MCV: 105.2 fL — ABNORMAL HIGH (ref 80.0–100.0)
Platelets: 182 10*3/uL (ref 150–400)
RBC: 2.52 MIL/uL — ABNORMAL LOW (ref 4.22–5.81)
RDW: 15.4 % (ref 11.5–15.5)
WBC: 3.6 10*3/uL — ABNORMAL LOW (ref 4.0–10.5)
nRBC: 0 % (ref 0.0–0.2)

## 2023-07-12 LAB — HEPATIC FUNCTION PANEL
ALT: 35 U/L (ref 0–44)
AST: 66 U/L — ABNORMAL HIGH (ref 15–41)
Albumin: 2.9 g/dL — ABNORMAL LOW (ref 3.5–5.0)
Alkaline Phosphatase: 89 U/L (ref 38–126)
Bilirubin, Direct: 0.3 mg/dL — ABNORMAL HIGH (ref 0.0–0.2)
Indirect Bilirubin: 0.6 mg/dL (ref 0.3–0.9)
Total Bilirubin: 0.9 mg/dL (ref 0.0–1.2)
Total Protein: 6.6 g/dL (ref 6.5–8.1)

## 2023-07-12 LAB — PHOSPHORUS: Phosphorus: 3.8 mg/dL (ref 2.5–4.6)

## 2023-07-12 LAB — MAGNESIUM: Magnesium: 1.7 mg/dL (ref 1.7–2.4)

## 2023-07-12 MED ORDER — POTASSIUM CHLORIDE CRYS ER 20 MEQ PO TBCR
40.0000 meq | EXTENDED_RELEASE_TABLET | Freq: Once | ORAL | Status: AC
  Administered 2023-07-12: 40 meq via ORAL
  Filled 2023-07-12: qty 2

## 2023-07-12 NOTE — Progress Notes (Signed)
  Progress Note   Patient: Richard Andrews UJW:[id number] DOB: 1960-12-18 DOA: 07/05/2023     7 DOS: the patient was seen and examined on 07/12/2023 at 12:20PM      Brief hospital course: 63 y.o. 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      Assessment and Plan: * Hypokalemia Hypomagnesemia Hypophosphatemia Generalized weakness due to electrolytes, ketosis, refeeding syndrome Treated and resolved.  Echo unremarkable, TSH normal  Starvation and alcoholic ketosis Refeeding syndrome Treated and resolved.  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  Lactulose started, now resolved. - Continue lactulose and Lasix - Alcohol cessation - Needs GI follow-up as an outpatient  Severe protein calorie malnutrition This by 20% weight loss over the last 9 months, now with severe loss of subcutaneous muscle mass and fat -Continue new supplements  Pancytopenia (HCC) Due to alcohol.  Counts improving - Repeat CBC in 1 week  Right lateral 4th rib fracture This was noted incidentally in evaluation of right arm pain. Appeared subacute on x-ray.  In conjunction with multiple old healed rib fractures.    Fever Positive blood culture New fever overnight 1/24.  Urinalysis 1/22 negative.  CXR clear.  Blood cultures 1/2 positive, but  suspected contaminant.  No further fever, no antibiotics given, now feels well.     Chronic subdural hematoma (HCC) CT imaging on admission showed no change  Hyponatremia Stable, asymptomatic.  Pancytopenia Due to alcohol use  Essential hypertension Not adherent to meds at home, BP soft here          Subjective: The patient has no complaints.  No new fever, appetite okay.  No respiratory symptoms.     Physical Exam: BP 108/82 (BP Location: Right Arm)   Pulse 80   Temp 98.4 F (36.9 C) (Oral)   Resp 16   Ht 5\' 11"  (1.803 m)   Wt 53.8 kg   SpO2 100%   BMI 16.54 kg/m   Very thin adult male, ambulating with physical therapy RRR, no murmurs, no peripheral edema Respiratory rate normal, lungs clear without rales or wheezes Diffuse loss of subcutaneous muscle mass and fat diffusely Diffuse ecchymoses Mild psychomotor slowing, oriented to person, place, and time, gait is shuffling and wide-based, he has ataxia in all 4 limbs, he has a posterior lean    Data Reviewed: Basic metabolic panel shows mild hyponatremia, stable over the last week, renal function normal CBC shows anemia, stable, platelets resolved  Family Communication: Wife by phone    Disposition: Status is: Inpatient The patient was admitted with effects of alcohol (withdrawal, starvation, electrolytes abnormality)  These are all resolved, and he is very weak and needs rehabilitation to return to his PLOF (lives with brother in law only, has limiated help with mobility)        Author: Alberteen Sam, MD 07/12/2023 3:06 PM  For on call review [email or web address].

## 2023-07-12 NOTE — Plan of Care (Signed)

## 2023-07-12 NOTE — Progress Notes (Signed)
Physical Therapy Treatment Patient Details Name: Richard Andrews MRN: [id number] DOB: December 22, 1960 Today's Date: 07/12/2023   History of Present Illness Pt is 63 yo male admitted on 07/05/23 after being found down in his own feces with weakness, hypokalemis, hypophosphatemia, and alcoholic/starvation ketosis.  Incidentally noted to have R lateral 4th rib fx.  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  Just wants to lay in bed.  Sharred he is from [employer] living in [place] with his Brother in [place].  "He lives with me".  Also stated he is sepaterated "about a year" and recently retired from "building bridges".  Admits to drinking at home beer and liquor. States he was driving, a new 4098 [id number]. Today, General bed mobility comments: increased time to initiate and complete, increased assist and time to complete scooting to EOB. General transfer comment: pt required increased assist to rise with posterior LOB and poor self correction.  Required x 3 attempts to rise from even an elvated bed.  From lower toilet level, pt required MAX Assist to rise as he was unable due to profound weakness. Also required assist with peri care during standing due to balance/coordination deficits. General Gait Details: VERY unsteady gait with wide stance and ataxic shuffled steps.  Attempt amb without walker (prior level no AD) resulted in increased instability and near fall.  Pt also presents with deconditioning, muscle atrophy and neuromuscular deficits.  HIGH FASLL RISK Pt will need ST Rehab at SNF to address mobility and functional decline prior to safely returning home.    If plan is discharge home, recommend the following: A little help with walking and/or transfers;A little help with bathing/dressing/bathroom;Assistance with cooking/housework;Help with stairs  or ramp for entrance   Can travel by private vehicle     Yes  Equipment Recommendations  None recommended by PT    Recommendations for Other Services       Precautions / Restrictions Precautions Precautions: Fall Precaution Comments: incontinent B/B (diarrhea) Restrictions Weight Bearing Restrictions Per Provider Order: No     Mobility  Bed Mobility Overal bed mobility: Needs Assistance Bed Mobility: Supine to Sit     Supine to sit: Min assist     General bed mobility comments: increased time to initiate and complete, increased assist and time to complete scooting to EOB.    Transfers Overall transfer level: Needs assistance Equipment used: Rolling walker (2 wheels), None Transfers: Sit to/from Stand Sit to Stand: Mod assist, Max assist, Total assist           General transfer comment: pt required increased assist to rise with posterior LOB and poor self correction.  Required x 3 attempts to rise from even an elvated bed.  From lower toilet level, pt required MAX Assist to rise as he was unable due to profound weakness.    Ambulation/Gait Ambulation/Gait assistance: Min assist, Mod assist Gait Distance (Feet): 75 Feet Assistive device: Rolling walker (2 wheels) Gait Pattern/deviations: Step-through pattern, Decreased stride length, Drifts right/left, Scissoring Gait velocity: decreased     General Gait Details: VERY unsteady gait with wide stance and ataxic shuffled steps.  Attempt amb without walker (prior level no AD) resulted in increased instability and near fall.  Pt also presents with deconditioning, muscle atrophy and neuromuscular deficits.  HIGH FASLL RISK   Stairs  Wheelchair Mobility     Tilt Bed    Modified Rankin (Stroke Patients Only)       Balance                                            Cognition Arousal: Alert Behavior During Therapy: Flat affect                     Orientation  Level: Time, Situation, Place   Memory: Decreased short-term memory Following Commands: Follows multi-step commands with increased time Safety/Judgement: Decreased awareness of safety   Problem Solving: Slow processing, Difficulty sequencing, Decreased initiation, Requires verbal cues General Comments: AxO x 1 only requiring MAX encouragement to participate.  Just wants to lay in bed.  Sharred he is from [employer] living in [place] with his Brother in [place].  "He lives with me".  Also stated he is sepaterated "about a year" and recently retired from "building bridges".  Admits to drinking at home beer and liquor. [place]re[place] he was driving, a new 1610 R604.        Exercises      General Comments        Pertinent Vitals/Pain Pain Assessment Pain Assessment: No/denies pain    Home Living                          Prior Function            PT Goals (current goals can now be found in the care plan section) Progress towards PT goals: Progressing toward goals    Frequency    Min 1X/week      PT Plan      Co-evaluation              AM-PAC PT "6 Clicks" Mobility   Outcome Measure  Help needed turning from your back to your side while in a flat bed without using bedrails?: A Lot Help needed moving from lying on your back to sitting on the side of a flat bed without using bedrails?: A Lot Help needed moving to and from a bed to a chair (including a wheelchair)?: A Lot Help needed standing up from a chair using your arms (e.g., wheelchair or bedside chair)?: A Lot Help needed to walk in hospital room?: A Lot Help needed climbing 3-5 steps with a railing? : A Lot 6 Click Score: 12    End of Session Equipment Utilized During Treatment: Gait belt Activity Tolerance: Patient limited by fatigue Patient left: in chair;with chair alarm set;with call bell/phone within reach Nurse Communication: Mobility status PT Visit Diagnosis: Unsteadiness on feet (R26.81);Muscle  weakness (generalized) (M62.81)     Time: 5409-8119 PT Time Calculation (min) (ACUTE ONLY): 23 min  Charges:    $Gait Training: 8-22 mins $Therapeutic Activity: 8-22 mins PT General Charges $$ ACUTE PT VISIT: 1 Visit                     Felecia Shelling  PTA Acute  Rehabilitation Services Office M-F          [phone]

## 2023-07-13 DIAGNOSIS — E876 Hypokalemia: Secondary | ICD-10-CM | POA: Diagnosis not present

## 2023-07-13 LAB — BASIC METABOLIC PANEL
Anion gap: 10 (ref 5–15)
BUN: 11 mg/dL (ref 8–23)
CO2: 22 mmol/L (ref 22–32)
Calcium: 8.8 mg/dL — ABNORMAL LOW (ref 8.9–10.3)
Chloride: 100 mmol/L (ref 98–111)
Creatinine, Ser: 0.53 mg/dL — ABNORMAL LOW (ref 0.61–1.24)
GFR, Estimated: >60 mL/min (ref >60)
Glucose, Bld: 100 mg/dL — ABNORMAL HIGH (ref 70–99)
Potassium: 3.5 mmol/L (ref 3.5–5.1)
Sodium: 132 mmol/L — ABNORMAL LOW (ref 135–145)

## 2023-07-13 LAB — CULTURE, BLOOD (ROUTINE X 2)
Special Requests: ADEQUATE
Special Requests: ADEQUATE

## 2023-07-13 NOTE — Progress Notes (Signed)
Mobility Specialist - Progress Note   07/13/23 1336  Mobility  Activity Transferred from bed to chair  Level of Assistance Standby assist, set-up cues, supervision of patient - no hands on  Assistive Device Front wheel walker  Distance Ambulated (ft) 2 ft  Activity Response Tolerated well  Mobility Referral Yes  Mobility visit 1 Mobility  Mobility Specialist Start Time (ACUTE ONLY) 1327  Mobility Specialist Stop Time (ACUTE ONLY) 1336  Mobility Specialist Time Calculation (min) (ACUTE ONLY) 9 min   Pt received in bed declining mobility but agreeable to transfer to the recliner for lunch. No complaints during session. Pt to recliner after session with all needs met.    [facility]

## 2023-07-13 NOTE — Progress Notes (Signed)
PROGRESS NOTE Richard Andrews  NTI:[id number] DOB: 11/02/1960 DOA: 07/05/2023 PCP: [name], NP  Brief Narrative/Hospital Course: 63 y.o. 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  On admission patient had workup including CT head chest x-ray shoulder x-ray-chronic finding no acute changes Underwent further workup echo unremarkable, TSH stable starvation) improving electrolyte imbalance has at this time improved.  No withdrawal signs at this time and remains stable but weak and deconditioned and planning for PT OT skilled nursing facility once bed available.   Subjective: Seen and examined this morning Is alert awake resting comfortably has no complaint. Is awaiting for placement medically stable   Assessment and Plan: Principal Problem:   Hypokalemia Active Problems:   Starvation and alcoholic ketosis   Apparent cirrhosis (HCC)   Alcohol use disorder, moderate, dependence (HCC)   Essential hypertension   Thrombocytopenia (HCC)   Macrocytic anemia   Hyponatremia   Hypomagnesemia   Chronic subdural hematoma (HCC)   Hypophosphatemia   Protein-calorie malnutrition, severe (HCC)   Generalized weakness   Fever   Right lateral 4th rib fracture   Pancytopenia (HCC)   Generalized weakness/deconditioning debility: Hypomagnesemia Hypophosphatemia Hyponatremia ketosis, refeeding syndrome: Patient is alert weakness debility in the setting of electrolyte balance ketosis refeeding syndrome.  At this time stable and improved echo stable TSH normal awaiting for placement   Starvation and alcoholic ketosis Refeeding syndrome Treated and resolved.   Alcohol use disorder,  severe, dependence Has history of alcohol withdrawal seizures in the past and history DTs during last admission.  Currently no signs of withdrawal, alcohol cessation has been discussed continue thiamine folate  Anemia of chronic disease in the setting of alcohol abuse: stable.Monitor Recent Labs    10/08/22 0549 10/10/22 0602 05/04/23 1025 05/05/23 0338 07/06/23 0522 07/07/23 0531 07/08/23 0749 07/09/23 0654 07/12/23 0508  HGB 10.8*   < >  --    < > 8.5* 8.5* 8.1* 9.2* 8.6*  MCV 102.9*   < >  --    < > 100.0 102.8* 102.9* 106.0* 105.2*  VITAMINB12 865  --  630  --   --   --   --   --   --   FOLATE  --   --  15.7  --   --   --   --   --   --   FERRITIN  --   --  570*  --   --   --   --   --   --   TIBC  --   --  274  --   --   --   --   --   --   IRON  --   --  109  --   --   --   --   --   --   RETICCTPCT  --   --  0.4  --   --   --   --   --   --    < > = values in this interval not displayed.    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  Augment diet and supplement   Pancytopenia: Secondary to chronic alcohol abuse, CBC in 1 week   Right lateral 4th rib fracture This was noted incidentally in evaluation of right arm pain. Appeared subacute on x-ray.  In conjunction with multiple old healed rib fractures.     Fever Positive blood culture New fever overnight 1/24.  Urinalysis 1/22 negative.  CXR clear.  Blood cultures 1/2 positive, but suspected contaminant.  No further fever, no antibiotics given, now feels well.      Chronic subdural hematoma CT imaging on admission showed no change   Essential hypertension Not adherent to meds at home, BP soft  here  DVT prophylaxis: SCDs Start: 07/05/23 1843 Code Status:   Code Status: Limited: Do not attempt resuscitation (DNR) -DNR-LIMITED -Do Not Intubate/DNI  Family Communication: plan of care discussed with patient at bedside. Patient status is: Remains hospitalized because of severity of illness Level of care: Med-Surg   Dispo: The patient is from: home with brother            Anticipated disposition:  SNF once evaluated.  He is medically stable   Objective: Vitals last 24 hrs: Vitals:   07/12/23 0609 07/12/23 1236 07/12/23 2034 07/13/23 0540  BP: 103/68 108/82 99/62 100/74  Pulse: 75 80 76 66  Resp: 16 16 16 16   Temp: 98.6 F (37 C) 98.4 F (36.9 C) 98 F (36.7 C) 98.4 F (36.9 C)  TempSrc: Oral Oral Oral Oral  SpO2: 99% 100% 100% 99%  Weight:      Height:       Weight change:   Physical Examination: General exam: alert awake,at baseline, older than stated age HEENT:Oral mucosa moist, Ear/Nose WNL grossly Respiratory system: Bilaterally clear BS,no use of accessory muscle Cardiovascular system: S1 & S2 +, No JVD. Gastrointestinal system: Abdomen soft,NT,ND, BS+ Nervous System: Alert, awake, moving all extremities,and following commands. Extremities: LE edema neg,distal peripheral pulses palpable and warm.  Skin: No rashes,no icterus. MSK: Normal muscle bulk,tone, power   Medications reviewed:  Scheduled Meds:  feeding supplement  237 mL Oral TID BM   folic acid  1 mg Oral Daily   furosemide  20 mg Oral Daily   lactulose  20 g Oral BID   magnesium oxide  400 mg Oral Daily   multivitamin with minerals  1 tablet Oral Daily   thiamine  100 mg Oral Daily  Continuous Infusions:   Diet Order             Diet regular Fluid consistency: Thin; Fluid restriction: 1200 mL Fluid  Diet effective now                    Nutrition Problem: Severe Malnutrition Etiology: social / environmental circumstances (alcohol abuse) Signs/Symptoms: moderate fat depletion, severe  muscle depletion, energy intake < or equal to 50% for > or equal to 1 month Interventions: Ensure Enlive (each supplement provides 350kcal and 20 grams of protein), MVI, Magic cup   Intake/Output Summary (Last 24 hours) at 07/13/2023 1051 Last data filed at 07/13/2023 0600 Gross per 24 hour  Intake 820 ml  Output 1200 ml  Net -380 ml   Net IO Since Admission: -1,380 mL [07/13/23 1051]  Wt Readings from Last 3 Encounters:  07/06/23 53.8 kg  05/25/23 57.2 kg  04/12/23 59 kg     Unresulted  Labs (From admission, onward)    None     Data Reviewed: I have personally reviewed following labs and imaging studies CBC: Recent Labs  Lab 07/07/23 0531 07/08/23 0749 07/09/23 0654 07/12/23 0508  WBC 2.6* 2.6* 3.0* 3.6*  HGB 8.5* 8.1* 9.2* 8.6*  HCT 25.3* 24.5* 28.3* 26.5*  MCV 102.8* 102.9* 106.0* 105.2*  PLT 46* 72* 101* 182   Basic Metabolic Panel:  Recent Labs  Lab 07/06/23 1346 07/07/23 0531 07/08/23 0749 07/09/23 0654 07/12/23 0508 07/13/23 0717  NA 130* 129* 130* 131* 129* 132*  K 3.6 3.3* 3.7 3.8 3.4* 3.5  CL 94* 94* 95* 100 94* 100  CO2 24 26 25 23 23 22   GLUCOSE 158* 98 116* 138* 95 100*  BUN 11 9 11 9 13 11   CREATININE 0.67 0.52* 0.58* 0.47* 0.32* 0.53*  CALCIUM 8.3* 8.5* 8.0* 8.7* 8.7* 8.8*  MG  --  1.6* 1.6*  --  1.7  --   PHOS 2.5 2.4* 2.6  --  3.8  --    GFR: Estimated Creatinine Clearance: 71.9 mL/min (A) (by C-G formula based on SCr of 0.53 mg/dL (L)). Liver Function Tests:  Recent Labs  Lab 07/06/23 1346 07/07/23 0531 07/08/23 0749 07/12/23 0508  AST  --  85*  --  66*  ALT  --  23  --  35  ALKPHOS  --  78  --  89  BILITOT  --  1.7*  --  0.9  PROT  --  6.1*  --  6.6  ALBUMIN 2.8* 2.8* 2.6* 2.9*   No results for input(s): "LIPASE", "AMYLASE" in the last 168 hours. No results for input(s): "AMMONIA" in the last 168 hours. Coagulation Profile:  Recent Labs  Lab 07/07/23 0531  INR 1.3*  Sepsis Labs: No results for input(s): "PROCALCITON",  "LATICACIDVEN" in the last 168 hours. Recent Results (from the past 240 hours)  Culture, blood (Routine X 2) w Reflex to ID Panel     Status: None   Collection Time: 07/05/23  5:46 PM   Specimen: BLOOD RIGHT ARM  Result Value Ref Range Status   Specimen Description   Final    BLOOD RIGHT ARM Performed at [facility], [address], [place], [name] [address]    Special Requests   Final    BOTTLES DRAWN AEROBIC ONLY Blood Culture results may not be optimal due to an inadequate volume of blood received in culture bottles Performed at [facility], [address], [place], [name] [address]    Culture   Final    NO GROWTH 5 DAYS Performed at [facility], [address] [place], [name] [address]    Report Status 07/10/2023 FINAL  Final  Culture, blood (Routine X 2) w Reflex to ID Panel     Status: Abnormal   Collection Time: 07/05/23  5:46 PM   Specimen: BLOOD RIGHT HAND  Result Value Ref Range Status   Specimen Description   Final    BLOOD RIGHT HAND Performed at [facility], [address], [place], [name] [address]    Special Requests   Final    BOTTLES DRAWN AEROBIC ONLY Blood Culture results may not be optimal due to an inadequate volume of blood received in culture bottles Performed at [facility], [address], [place], [name] [address]    Culture  Setup Time   Final    GRAM POSITIVE COCCI IN CLUSTERS AEROBIC BOTTLE  ONLY CRITICAL RESULT CALLED TO, READ BACK BY AND VERIFIED WITH: PHARMD E. Jean Rosenthal [id number] @ 2200 FH    Culture (A)  Final    STAPHYLOCOCCUS EPIDERMIDIS THE SIGNIFICANCE OF ISOLATING THIS ORGANISM FROM A SINGLE SET OF BLOOD CULTURES WHEN MULTIPLE SETS ARE DRAWN IS UNCERTAIN. PLEASE NOTIFY THE MICROBIOLOGY DEPARTMENT WITHIN ONE WEEK IF SPECIATION AND SENSITIVITIES ARE REQUIRED. Performed at [facility], [address] [place], [name] [address]    Report Status 07/07/2023 FINAL  Final   Blood Culture ID Panel (Reflexed)     Status: Abnormal   Collection Time: 07/05/23  5:46 PM  Result Value Ref Range Status   Enterococcus faecalis NOT DETECTED NOT DETECTED Final   Enterococcus Faecium NOT DETECTED NOT DETECTED Final   Listeria monocytogenes NOT DETECTED NOT DETECTED Final   Staphylococcus species DETECTED (A) NOT DETECTED Final    Comment: CRITICAL RESULT CALLED TO, READ BACK BY AND VERIFIED WITH: PHARMD EJean Rosenthal [id number] @ 2200 FH    Staphylococcus aureus (BCID) NOT DETECTED NOT DETECTED Final   Staphylococcus epidermidis DETECTED (A) NOT DETECTED Final    Comment: CRITICAL RESULT CALLED TO, READ BACK BY AND VERIFIED WITH: PHARMD EJean Rosenthal [phone] @ 2200 FH    Staphylococcus lugdunensis NOT DETECTED NOT DETECTED Final   Streptococcus species NOT DETECTED NOT DETECTED Final   Streptococcus agalactiae NOT DETECTED NOT DETECTED Final   Streptococcus pneumoniae NOT DETECTED NOT DETECTED Final   Streptococcus pyogenes NOT DETECTED NOT DETECTED Final   A.calcoaceticus-baumannii NOT DETECTED NOT DETECTED Final   Bacteroides fragilis NOT DETECTED NOT DETECTED Final   Enterobacterales NOT DETECTED NOT DETECTED Final   Enterobacter cloacae complex NOT DETECTED NOT DETECTED Final   Escherichia coli NOT DETECTED NOT DETECTED Final   Klebsiella aerogenes NOT DETECTED NOT DETECTED Final   Klebsiella oxytoca NOT DETECTED NOT DETECTED Final   Klebsiella pneumoniae NOT DETECTED NOT DETECTED Final   Proteus species NOT DETECTED NOT DETECTED Final   Salmonella species NOT DETECTED NOT DETECTED Final   Serratia marcescens NOT DETECTED NOT DETECTED Final   Haemophilus influenzae NOT DETECTED NOT DETECTED Final   Neisseria meningitidis NOT DETECTED NOT DETECTED Final   Pseudomonas aeruginosa NOT DETECTED NOT DETECTED Final   Stenotrophomonas maltophilia NOT DETECTED NOT DETECTED Final   Candida albicans NOT DETECTED NOT DETECTED Final   Candida auris NOT DETECTED NOT DETECTED Final    Candida glabrata NOT DETECTED NOT DETECTED Final   Candida krusei NOT DETECTED NOT DETECTED Final   Candida parapsilosis NOT DETECTED NOT DETECTED Final   Candida tropicalis NOT DETECTED NOT DETECTED Final   Cryptococcus neoformans/gattii NOT DETECTED NOT DETECTED Final   Methicillin resistance mecA/C NOT DETECTED NOT DETECTED Final    Comment: Performed at [facility], [address] [place], [name] [address]  SARS Coronavirus 2 by RT PCR (hospital order, performed in [facility] hospital lab) *cepheid single result test* Anterior Nasal Swab     Status: None   Collection Time: 07/07/23  9:15 AM   Specimen: Anterior Nasal Swab  Result Value Ref Range Status   SARS Coronavirus 2 by RT PCR NEGATIVE NEGATIVE Final    Comment: (NOTE) SARS-CoV-2 target nucleic acids are NOT DETECTED.  The SARS-CoV-2 RNA is generally detectable in upper and lower respiratory specimens during the acute phase of infection. The lowest concentration of SARS-CoV-2 viral copies this assay can detect is 250 copies / mL. A negative result does not preclude SARS-CoV-2 infection and should not be  used as the sole basis for treatment or other patient management decisions.  A negative result may occur with improper specimen collection / handling, submission of specimen other than nasopharyngeal swab, presence of viral mutation(s) within the areas targeted by this assay, and inadequate number of viral copies (<250 copies / mL). A negative result must be combined with clinical observations, patient history, and epidemiological information.  Fact Sheet for Patients:   RoadLapTop.co.za  Fact Sheet for Healthcare Providers: http://kim-miller.com/  This test is not yet approved or  cleared by the [place] FDA and has been authorized for detection and/or diagnosis of SARS-CoV-2 by FDA under an Emergency Use Authorization (EUA).  This EUA will remain in effect  (meaning this test can be used) for the duration of the COVID-19 declaration under Section 564(b)(1) of the Act, 21 U.S.C. section 360bbb-3(b)(1), unless the authorization is terminated or revoked sooner.  Performed at [facility], [address] [place], [name] [address]   Respiratory (~20 pathogens) panel by PCR     Status: None   Collection Time: 07/07/23  9:15 AM   Specimen: Nasopharyngeal Swab; Respiratory  Result Value Ref Range Status   Adenovirus NOT DETECTED NOT DETECTED Final   Coronavirus 229E NOT DETECTED NOT DETECTED Final    Comment: (NOTE) The Coronavirus on the Respiratory Panel, DOES NOT test for the novel  Coronavirus (2019 nCoV)    Coronavirus HKU1 NOT DETECTED NOT DETECTED Final   Coronavirus NL63 NOT DETECTED NOT DETECTED Final   Coronavirus OC43 NOT DETECTED NOT DETECTED Final   Metapneumovirus NOT DETECTED NOT DETECTED Final   Rhinovirus / Enterovirus NOT DETECTED NOT DETECTED Final   Influenza A NOT DETECTED NOT DETECTED Final   Influenza B NOT DETECTED NOT DETECTED Final   Parainfluenza Virus 1 NOT DETECTED NOT DETECTED Final   Parainfluenza Virus 2 NOT DETECTED NOT DETECTED Final   Parainfluenza Virus 3 NOT DETECTED NOT DETECTED Final   Parainfluenza Virus 4 NOT DETECTED NOT DETECTED Final   Respiratory Syncytial Virus NOT DETECTED NOT DETECTED Final   Bordetella pertussis NOT DETECTED NOT DETECTED Final   Bordetella Parapertussis NOT DETECTED NOT DETECTED Final   Chlamydophila pneumoniae NOT DETECTED NOT DETECTED Final   Mycoplasma pneumoniae NOT DETECTED NOT DETECTED Final    Comment: Performed at [facility], [address], [place], [name] [address]  Culture, blood (Routine X 2) w Reflex to ID Panel     Status: None   Collection Time: 07/08/23 11:41 AM   Specimen: BLOOD LEFT ARM  Result Value Ref Range Status   Specimen Description   Final    BLOOD LEFT ARM Performed at [facility], [address],  [place], [name] [address]    Special Requests   Final    BOTTLES DRAWN AEROBIC AND ANAEROBIC Blood Culture adequate volume Performed at [facility], [address], [place], [name] [address]    Culture   Final    NO GROWTH 5 DAYS Performed at [facility], [address], [place], [name] [address]    Report Status 07/13/2023 FINAL  Final  Culture, blood (Routine X 2) w Reflex to ID Panel     Status: None   Collection Time: 07/08/23 11:51 AM   Specimen: BLOOD RIGHT ARM  Result Value Ref Range Status   Specimen Description   Final    BLOOD RIGHT ARM Performed at [facility], [address], [place], [name] [address]  Special Requests   Final    BOTTLES DRAWN AEROBIC AND ANAEROBIC Blood Culture adequate volume Performed at [facility], [address] [place], [name] [address]    Culture   Final    NO GROWTH 5 DAYS Performed at [facility], [address], [place], [name] [address]    Report Status 07/13/2023 FINAL  Final    Antimicrobials/Microbiology: Anti-infectives (From admission, onward)    None         Component Value Date/Time   SDES  07/08/2023 1151    BLOOD RIGHT ARM Performed at [facility], [address], [place], [name] [address]    SPECREQUEST  07/08/2023 1151    BOTTLES DRAWN AEROBIC AND ANAEROBIC Blood Culture adequate volume Performed at [facility], [address], [place], [name] [address]    CULT  07/08/2023 1151    NO GROWTH 5 DAYS Performed at [facility], [address] [place], [name] [address]    REPTSTATUS 07/13/2023 FINAL 07/08/2023 1151     Radiology Studies: No results found.   LOS: 8 days   Total time spent in review of labs and imaging, patient evaluation, formulation of plan, documentation and communication with family: 25 minutes  Lanae Boast, MD  [facility]  07/13/2023, 10:51 AM

## 2023-07-13 NOTE — Progress Notes (Signed)
Nutrition Follow-up  DOCUMENTATION CODES:   Severe malnutrition in context of social or environmental circumstances  INTERVENTION:   -Ensure Plus High Protein po TID, each supplement provides 350 kcal and 20 grams of protein.  -Continue CIWA vitamin supplementation -Magic cup BID with meals, each supplement provides 290 kcal and 9 grams of protein    -Placed order for updated weight  NUTRITION DIAGNOSIS:   Severe Malnutrition related to social / environmental circumstances (alcohol abuse) as evidenced by moderate fat depletion, severe muscle depletion, energy intake < or equal to 50% for > or equal to 1 month.Percent weight loss as well.  Ongoing.  GOAL:   Patient will meet greater than or equal to 90% of their needs  Progressing.  MONITOR:   PO intake, Supplement acceptance  ASSESSMENT:   63 y.o. 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  1/22: admitted  Patient currently consuming 50-100% of meals over the past 2 days. Pt accepting Ensure. Continues to take vitamin regimen.   1/23 weight: 118 lbs Pt has lost 7 lbs since 05/25/23 (5% wt loss x 1 month, significant for time frame). Have ordered for a new weight as it has been 7 days since last weight.  Medications: Folic acid, Lasix, Lactulose, MAG-OX, Multivitamin with minerals daily, Thiamine  Labs reviewed:  Low Na   Diet Order:   Diet Order             Diet regular Fluid consistency: Thin; Fluid restriction: 1200 mL Fluid  Diet effective now                   EDUCATION NEEDS:   No education needs have been identified at this time  Skin:  Skin Assessment: Skin Integrity Issues: Skin Integrity Issues:: Other (Comment) Other: skin tears on buttocks  Last BM:   1/29  Height:   Ht Readings from Last 1 Encounters:  07/06/23 5\' 11"  (1.803 m)    Weight:   Wt Readings from Last 1 Encounters:  07/06/23 53.8 kg    BMI:  Body mass index is 16.54 kg/m.  Estimated Nutritional Needs:   Kcal:  2200-2400  Protein:  110-120g  Fluid:  Per MD   Tilda Franco, MS, RD, LDN Inpatient Clinical Dietitian Contact via Secure chat

## 2023-07-13 NOTE — TOC Progression Note (Signed)
Transition of Care [facility]) - Progression Note    Patient Details  Name: Richard Andrews MRN: [id number] Date of Birth: October 08, 1960  Transition of Care [facility]) CM/SW Contact  Howell Rucks, RN Phone Number: 07/13/2023, 3:29 PM  Clinical Narrative:   Per [facility], auth still pending.     Expected Discharge Plan: Skilled Nursing Facility (SNF recommendation currently refusing) Barriers to Discharge: Continued Medical Work up  Expected Discharge Plan and Services In-house Referral: NA Discharge Planning Services: CM Consult Post Acute Care Choice: Skilled Nursing Facility (Refused) Living arrangements for the past 2 months: Single Family Home Expected Discharge Date: 07/10/23               DME Arranged: N/A DME Agency: NA                   Social Determinants of Health (SDOH) Interventions SDOH Screenings   Food Insecurity: Food Insecurity Present (07/06/2023)  Housing: Unknown (07/06/2023)  Transportation Needs: No Transportation Needs (07/06/2023)  Utilities: Not At Risk (07/06/2023)  Depression (PHQ2-9): Low Risk  (03/07/2023)  Social Connections: Socially Isolated (07/06/2023)  Tobacco Use: High Risk (07/05/2023)    Readmission Risk Interventions    07/11/2023    4:08 PM  Readmission Risk Prevention Plan  Transportation Screening Complete  PCP or Specialist Appt within 3-5 Days Complete  HRI or Home Care Consult Complete  Social Work Consult for Recovery Care Planning/Counseling Complete  Palliative Care Screening Not Applicable  Medication Review Oceanographer) Complete

## 2023-07-13 NOTE — Plan of Care (Signed)
Problem: Education: Goal: Knowledge of General Education information will improve Description Including pain rating scale, medication(s)/side effects and non-pharmacologic comfort measures Outcome: Progressing   Problem: Health Behavior/Discharge Planning: Goal: Ability to manage health-related needs will improve Outcome: Progressing

## 2023-07-14 DIAGNOSIS — E876 Hypokalemia: Secondary | ICD-10-CM | POA: Diagnosis not present

## 2023-07-14 NOTE — TOC Progression Note (Signed)
Transition of Care [facility]) - Progression Note    Patient Details  Name: Richard Andrews MRN: [id number] Date of Birth: [birth year]  [facility]) CM/SW Contact  Howell Rucks, RN Phone Number: 07/14/2023, 12:26 PM  Clinical Narrative:   Text received from [facility], confirmed SNF auth approved, bed available for admit tomorrow, team notified.     Expected Discharge Plan: Skilled Nursing Facility (SNF recommendation currently refusing) Barriers to Discharge: Continued Medical Work up  Expected Discharge Plan and Services In-house Referral: NA Discharge Planning Services: CM Consult Post Acute Care Choice: Skilled Nursing Facility (Refused) Living arrangements for the past 2 months: Single Family Home Expected Discharge Date: 07/10/23               DME Arranged: N/A DME Agency: NA                   Social Determinants of Health (SDOH) Interventions SDOH Screenings   Food Insecurity: Food Insecurity Present (07/06/2023)  Housing: Unknown (07/06/2023)  Transportation Needs: No Transportation Needs (07/06/2023)  Utilities: Not At Risk (07/06/2023)  Depression (PHQ2-9): Low Risk  (03/07/2023)  Social Connections: Socially Isolated (07/06/2023)  Tobacco Use: High Risk (07/05/2023)    Readmission Risk Interventions    07/11/2023    4:08 PM  Readmission Risk Prevention Plan  Transportation Screening Complete  PCP or Specialist Appt within 3-5 Days Complete  HRI or Home Care Consult Complete  Social Work Consult for Recovery Care Planning/Counseling Complete  Palliative Care Screening Not Applicable  Medication Review Oceanographer) Complete

## 2023-07-14 NOTE — TOC Progression Note (Signed)
Transition of Care [facility]) - Progression Note    Patient Details  Name: Richard Andrews MRN: [id number] Date of Birth: [birth year]  Transition of Care [facility]) CM/SW Contact  Howell Rucks, RN Phone Number: 07/14/2023, 12:11 PM  Clinical Narrative:  Met with pt at bedside, still agreeable to short term rehab, updated on pending insurance auth, pt voiced understanding. TOC will continue to follow.      Expected Discharge Plan: Skilled Nursing Facility (SNF recommendation currently refusing) Barriers to Discharge: Continued Medical Work up  Expected Discharge Plan and Services In-house Referral: NA Discharge Planning Services: CM Consult Post Acute Care Choice: Skilled Nursing Facility (Refused) Living arrangements for the past 2 months: Single Family Home Expected Discharge Date: 07/10/23               DME Arranged: N/A DME Agency: NA                   Social Determinants of Health (SDOH) Interventions SDOH Screenings   Food Insecurity: Food Insecurity Present (07/06/2023)  Housing: Unknown (07/06/2023)  Transportation Needs: No Transportation Needs (07/06/2023)  Utilities: Not At Risk (07/06/2023)  Depression (PHQ2-9): Low Risk  (03/07/2023)  Social Connections: Socially Isolated (07/06/2023)  Tobacco Use: High Risk (07/05/2023)    Readmission Risk Interventions    07/11/2023    4:08 PM  Readmission Risk Prevention Plan  Transportation Screening Complete  PCP or Specialist Appt within 3-5 Days Complete  HRI or Home Care Consult Complete  Social Work Consult for Recovery Care Planning/Counseling Complete  Palliative Care Screening Not Applicable  Medication Review Oceanographer) Complete

## 2023-07-14 NOTE — Progress Notes (Signed)
Physical Therapy Treatment Patient Details Name: Richard Andrews MRN: [id number] DOB: 17-Sep-1960 Today's Date: 07/14/2023   History of Present Illness Pt is 63 yo male admitted on 07/05/23 after being found down in his own feces with weakness, hypokalemis, hypophosphatemia, and alcoholic/starvation ketosis.  Incidentally noted to have R lateral 4th rib fx.  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  Pt currently with functional limitations due to the deficits listed below (see PT Problem List). Pt in bed when PT arrived. Pt required minimal cues for encouragement to participate with therapy . Pt demonstrated increased IND with functional mobility tasks. Pt  required min cues and S for supine <> sit, CGA for sit to stand  from EOB with initial balance with posterior lean and wide BOS with pt able to correct posture with min cues and B UE support at RW. Pt progressed with ambulation tasks today with RW, CGA and progressing to close S, cues for posture, proper distance from RW and safety 150 feet x 2. Pt declined to get in recliner and returned to bed and all needs in place. Pt will benefit from acute skilled PT to increase their independence and safety with mobility to allow discharge.      If plan is discharge home, recommend the following: A little help with walking and/or transfers;A little help with bathing/dressing/bathroom;Assistance with cooking/housework;Help with stairs or ramp for entrance   Can travel by private vehicle     Yes  Equipment Recommendations  None recommended by PT    Recommendations for Other Services       Precautions / Restrictions Precautions Precautions: Fall Precaution Comments: incontinent B/B (diarrhea) Restrictions Weight Bearing Restrictions Per Provider Order: No     Mobility  Bed Mobility Overal bed mobility: Needs  Assistance Bed Mobility: Supine to Sit, Sit to Supine Rolling: Modified independent (Device/Increase time)   Supine to sit: Supervision Sit to supine: Supervision   General bed mobility comments: min cues, HOB elevated and increased time    Transfers Overall transfer level: Needs assistance Equipment used: Rolling walker (2 wheels), None Transfers: Sit to/from Stand Sit to Stand: Contact guard assist           General transfer comment: min cues, pull to stand, slight posterior lean with initial standing with wide BOS and pt able to modify posture with cues    Ambulation/Gait Ambulation/Gait assistance: Contact guard assist Gait Distance (Feet): 150 Feet Assistive device: Rolling walker (2 wheels) Gait Pattern/deviations: Step-through pattern Gait velocity: decreased     General Gait Details: pt demonstrated improved safety and stabiltiy with use of RW today, pt demonstrated good reciprocal pattern, no overt epiosdes of LOB, slighly decreased cadence, pt required cues for posture, proper distance from RW, pt able to amb 150 feet with standing rest break and an additional 150 feet pt able to progress from CGA to close S   Stairs             Wheelchair Mobility     Tilt Bed    Modified Rankin (Stroke Patients Only)       Balance Overall balance assessment: Needs assistance Sitting-balance support: Feet supported, No upper extremity supported Sitting balance-Leahy Scale: Good     Standing balance support: Bilateral upper extremity supported, Reliant on assistive device for balance, During functional activity Standing balance-Leahy Scale: Poor Standing balance comment:  Needs RW and CGA wtih cues initial posterior lean                            Cognition Arousal: Alert Behavior During Therapy: Flat affect Overall Cognitive Status: Difficult to assess                                 General Comments: pt appeared to be able to  communicate needs, indicated he was planning on d/c to SNF for short term rehab and aware of situation and location        Exercises      General Comments        Pertinent Vitals/Pain Pain Assessment Pain Assessment: No/denies pain    Home Living                          Prior Function            PT Goals (current goals can now be found in the care plan section) Acute Rehab PT Goals Patient Stated Goal: return home PT Goal Formulation: With patient Time For Goal Achievement: 07/24/23 Potential to Achieve Goals: Good Progress towards PT goals: Progressing toward goals    Frequency    Min 1X/week      PT Plan      Co-evaluation              AM-PAC PT "6 Clicks" Mobility   Outcome Measure  Help needed turning from your back to your side while in a flat bed without using bedrails?: A Little Help needed moving from lying on your back to sitting on the side of a flat bed without using bedrails?: A Little Help needed moving to and from a bed to a chair (including a wheelchair)?: A Little Help needed standing up from a chair using your arms (e.g., wheelchair or bedside chair)?: A Little Help needed to walk in hospital room?: A Little Help needed climbing 3-5 steps with a railing? : A Lot 6 Click Score: 17    End of Session Equipment Utilized During Treatment: Gait belt Activity Tolerance: Patient tolerated treatment well;No increased pain Patient left: with call bell/phone within reach;in bed;with bed alarm set Nurse Communication: Mobility status PT Visit Diagnosis: Unsteadiness on feet (R26.81);Muscle weakness (generalized) (M62.81)     Time: 9323-5573 PT Time Calculation (min) (ACUTE ONLY): 17 min  Charges:    $Gait Training: 8-22 mins PT General Charges $$ ACUTE PT VISIT: 1 Visit                     Johnny Bridge, PT Acute Rehab    Jacqualyn Posey 07/14/2023, 11:56 AM

## 2023-07-14 NOTE — Progress Notes (Signed)
PROGRESS NOTE ATHANASIOS Andrews  GNF:[id number] DOB: 1960-09-03 DOA: 07/05/2023 PCP: [name], NP  Brief Narrative/Hospital Course: 63 y.o. 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  On admission patient had workup including CT head chest x-ray shoulder x-ray-chronic finding no acute changes Underwent further workup echo unremarkable, TSH stable starvation) improving electrolyte imbalance has at this time improved.  No withdrawal signs at this time and remains stable but weak and deconditioned and planning for PT OT skilled nursing facility once bed available.  Subjective: Seen and examined this morning  He feels well no new complaints no more leg swelling tolerating diet  Is awaiting for placement, is medically stable   Assessment and Plan: Principal Problem:   Hypokalemia Active Problems:   Starvation and alcoholic ketosis   Apparent cirrhosis (HCC)   Alcohol use disorder, moderate, dependence (HCC)   Essential hypertension   Thrombocytopenia (HCC)   Macrocytic anemia   Hyponatremia   Hypomagnesemia   Chronic subdural hematoma (HCC)   Hypophosphatemia   Protein-calorie malnutrition, severe (HCC)   Generalized weakness   Fever   Right lateral 4th rib fracture   Pancytopenia (HCC)   Generalized weakness/deconditioning debility: Hypomagnesemia Hypophosphatemia Hyponatremia ketosis, refeeding syndrome: Patient with weakness debility in the setting of electrolyte balance ketosis refeeding syndrome. At this time stable and improved echo stable TSH normal awaiting for placement   Starvation and alcoholic ketosis Refeeding syndrome Treated and resolved.  Tolerating  diet   Alcohol use disorder, severe, dependence Has history of alcohol withdrawal seizures in the past and history DTs during last admission.  Currently no signs of withdrawal, alcohol cessation has been discussed continue thiamine folate  Anemia of chronic disease in the setting of alcohol abuse: stable.Monitor Recent Labs    10/08/22 0549 10/10/22 0602 05/04/23 1025 05/05/23 0338 07/06/23 0522 07/07/23 0531 07/08/23 0749 07/09/23 0654 07/12/23 0508  HGB 10.8*   < >  --    < > 8.5* 8.5* 8.1* 9.2* 8.6*  MCV 102.9*   < >  --    < > 100.0 102.8* 102.9* 106.0* 105.2*  VITAMINB12 865  --  630  --   --   --   --   --   --   FOLATE  --   --  15.7  --   --   --   --   --   --   FERRITIN  --   --  570*  --   --   --   --   --   --   TIBC  --   --  274  --   --   --   --   --   --   IRON  --   --  109  --   --   --   --   --   --   RETICCTPCT  --   --  0.4  --   --   --   --   --   --    < > = values in this interval not displayed.    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  Augment diet and supplement   Pancytopenia: Secondary to chronic alcohol abuse, CBC in 1 week   Right lateral 4th rib fracture This was noted incidentally in evaluation of right arm pain. Appeared subacute on x-ray.  In conjunction with multiple old healed rib fractures.     Fever Positive blood culture New fever overnight 1/24.  Urinalysis 1/22 negative.  CXR clear.  Blood cultures 1/2 positive, but suspected contaminant.  No further fever, no antibiotics given, now feels well.      Chronic subdural hematoma CT imaging on admission showed no change   Essential hypertension Not  adherent to meds at home, BP soft here  DVT prophylaxis: SCDs Start: 07/05/23 1843 Code Status:   Code Status: Limited: Do not attempt resuscitation (DNR) -DNR-LIMITED -Do Not Intubate/DNI  Family Communication: plan of care discussed with patient at bedside. Patient status is: Remains hospitalized because of severity of illness Level of care: Med-Surg   Dispo: The patient is from: home with brother            Anticipated disposition:  SNF once evaluated.  He is medically stable   Objective: Vitals last 24 hrs: Vitals:   07/13/23 1411 07/13/23 1522 07/13/23 2049 07/14/23 0536  BP: 117/86  102/71 102/68  Pulse: 72  74 67  Resp:   16 15  Temp: 97.7 F (36.5 C)  98.7 F (37.1 C) 99.2 F (37.3 C)  TempSrc: Oral  Oral Oral  SpO2: 100%  100% 99%  Weight:  54 kg    Height:       Weight change:   Physical Examination: General exam: alert awake, oriented at baseline, older than stated age HEENT:Oral mucosa moist, Ear/Nose WNL grossly Respiratory system: Bilaterally clear BS,no use of accessory muscle Cardiovascular system: S1 & S2 +, No JVD. Gastrointestinal system: Abdomen soft,NT,ND, BS+ Nervous System: Alert, awake, moving all extremities,and following commands. Extremities: LE edema neg,distal peripheral pulses palpable and warm.  Skin: No rashes,no icterus. MSK: Normal muscle bulk,tone, power   Medications reviewed:  Scheduled Meds:  feeding supplement  237 mL Oral TID BM   folic acid  1 mg Oral Daily   furosemide  20 mg Oral Daily   lactulose  20 g Oral BID   magnesium oxide  400 mg Oral Daily   multivitamin with minerals  1 tablet Oral Daily   thiamine  100 mg Oral Daily  Continuous Infusions:   Diet Order             Diet regular Fluid consistency: Thin; Fluid restriction: 1200 mL Fluid  Diet effective now                    Nutrition Problem: Severe Malnutrition Etiology: social / environmental circumstances (alcohol abuse) Signs/Symptoms: moderate fat  depletion, severe muscle depletion, energy intake < or equal to 50% for > or equal to 1 month Interventions: Ensure Enlive (each supplement provides 350kcal and 20 grams of protein), MVI, Magic cup   Intake/Output Summary (Last 24 hours) at 07/14/2023 1035 Last data filed at 07/14/2023 1000 Gross per 24 hour  Intake 1890 ml  Output 2250 ml  Net -360 ml   Net IO Since Admission: -1,600 mL [07/14/23 1035]  Wt Readings from Last 3 Encounters:  07/13/23 54 kg  05/25/23 57.2 kg  04/12/23 59 kg     Unresulted Labs (From admission, onward)    None     Data Reviewed: I have personally reviewed following labs and imaging studies CBC: Recent Labs  Lab 07/08/23 0749 07/09/23 0654 07/12/23 0508  WBC 2.6* 3.0* 3.6*  HGB 8.1* 9.2* 8.6*  HCT 24.5* 28.3* 26.5*  MCV 102.9* 106.0* 105.2*  PLT 72* 101* 182   Basic Metabolic Panel:  Recent Labs  Lab 07/08/23 0749 07/09/23 0654 07/12/23 0508 07/13/23 0717  NA 130* 131* 129* 132*  K 3.7 3.8 3.4* 3.5  CL 95* 100 94* 100  CO2 25 23 23 22   GLUCOSE 116* 138* 95 100*  BUN 11 9 13 11   CREATININE 0.58* 0.47* 0.32* 0.53*  CALCIUM 8.0* 8.7* 8.7* 8.8*  MG 1.6*  --  1.7  --   PHOS 2.6  --  3.8  --    GFR: Estimated Creatinine Clearance: 72.2 mL/min (A) (by C-G formula based on SCr of 0.53 mg/dL (L)). Liver Function Tests:  Recent Labs  Lab 07/08/23 0749 07/12/23 0508  AST  --  66*  ALT  --  35  ALKPHOS  --  89  BILITOT  --  0.9  PROT  --  6.6  ALBUMIN 2.6* 2.9*   No results for input(s): "LIPASE", "AMYLASE" in the last 168 hours. No results for input(s): "AMMONIA" in the last 168 hours. Coagulation Profile:  No results for input(s): "INR", "PROTIME" in the last 168 hours. Sepsis Labs: No results for input(s): "PROCALCITON", "LATICACIDVEN" in the last 168 hours. Recent Results (from the past 240 hours)  Culture, blood (Routine X 2) w Reflex to ID Panel     Status: None   Collection Time: 07/05/23  5:46 PM   Specimen: BLOOD RIGHT  ARM  Result Value Ref Range Status   Specimen Description   Final    BLOOD RIGHT ARM Performed at [facility], [address], [place], [name] [address]    Special Requests   Final    BOTTLES DRAWN AEROBIC ONLY Blood Culture results may not be optimal due to an inadequate volume of blood received in culture bottles Performed at [facility], [address], [place], [name] [address]    Culture   Final    NO GROWTH 5 DAYS Performed at [facility], [address], [place], [name] [address]    Report Status 07/10/2023 FINAL  Final  Culture, blood (Routine X 2) w Reflex to ID Panel     Status: Abnormal   Collection Time: 07/05/23  5:46 PM   Specimen: BLOOD RIGHT HAND  Result Value Ref Range Status   Specimen Description   Final    BLOOD RIGHT HAND Performed at [facility], [address] [place], [name] [address]    Special Requests   Final    BOTTLES DRAWN AEROBIC ONLY Blood Culture results may not be optimal due to an inadequate volume of blood received in culture bottles Performed at [facility], [address], [place], [name] [address]    Culture  Setup Time   Final    GRAM POSITIVE COCCI IN CLUSTERS AEROBIC BOTTLE ONLY CRITICAL RESULT CALLED TO, READ BACK BY AND VERIFIED WITH: PHARMD E. Jean Rosenthal [phone] @ 2200 FH    Culture (A)  Final    STAPHYLOCOCCUS EPIDERMIDIS THE SIGNIFICANCE OF ISOLATING THIS ORGANISM FROM A SINGLE SET OF BLOOD CULTURES WHEN MULTIPLE SETS ARE DRAWN IS UNCERTAIN.  PLEASE NOTIFY THE MICROBIOLOGY DEPARTMENT WITHIN ONE WEEK IF SPECIATION AND SENSITIVITIES ARE REQUIRED. Performed at [facility], [address] [place], [name] [address]    Report Status 07/07/2023 FINAL  Final  Blood Culture ID Panel (Reflexed)     Status: Abnormal   Collection Time: 07/05/23  5:46 PM  Result Value Ref Range Status   Enterococcus faecalis NOT DETECTED NOT DETECTED Final   Enterococcus Faecium NOT  DETECTED NOT DETECTED Final   Listeria monocytogenes NOT DETECTED NOT DETECTED Final   Staphylococcus species DETECTED (A) NOT DETECTED Final    Comment: CRITICAL RESULT CALLED TO, READ BACK BY AND VERIFIED WITH: PHARMD EJean Rosenthal [phone] @ 2200 FH    Staphylococcus aureus (BCID) NOT DETECTED NOT DETECTED Final   Staphylococcus epidermidis DETECTED (A) NOT DETECTED Final    Comment: CRITICAL RESULT CALLED TO, READ BACK BY AND VERIFIED WITH: PHARMD EJean Rosenthal [phone] @ 2200 FH    Staphylococcus lugdunensis NOT DETECTED NOT DETECTED Final   Streptococcus species NOT DETECTED NOT DETECTED Final   Streptococcus agalactiae NOT DETECTED NOT DETECTED Final   Streptococcus pneumoniae NOT DETECTED NOT DETECTED Final   Streptococcus pyogenes NOT DETECTED NOT DETECTED Final   A.calcoaceticus-baumannii NOT DETECTED NOT DETECTED Final   Bacteroides fragilis NOT DETECTED NOT DETECTED Final   Enterobacterales NOT DETECTED NOT DETECTED Final   Enterobacter cloacae complex NOT DETECTED NOT DETECTED Final   Escherichia coli NOT DETECTED NOT DETECTED Final   Klebsiella aerogenes NOT DETECTED NOT DETECTED Final   Klebsiella oxytoca NOT DETECTED NOT DETECTED Final   Klebsiella pneumoniae NOT DETECTED NOT DETECTED Final   Proteus species NOT DETECTED NOT DETECTED Final   Salmonella species NOT DETECTED NOT DETECTED Final   Serratia marcescens NOT DETECTED NOT DETECTED Final   Haemophilus influenzae NOT DETECTED NOT DETECTED Final   Neisseria meningitidis NOT DETECTED NOT DETECTED Final   Pseudomonas aeruginosa NOT DETECTED NOT DETECTED Final   Stenotrophomonas maltophilia NOT DETECTED NOT DETECTED Final   Candida albicans NOT DETECTED NOT DETECTED Final   Candida auris NOT DETECTED NOT DETECTED Final   Candida glabrata NOT DETECTED NOT DETECTED Final   Candida krusei NOT DETECTED NOT DETECTED Final   Candida parapsilosis NOT DETECTED NOT DETECTED Final   Candida tropicalis NOT DETECTED NOT DETECTED Final    Cryptococcus neoformans/gattii NOT DETECTED NOT DETECTED Final   Methicillin resistance mecA/C NOT DETECTED NOT DETECTED Final    Comment: Performed at [facility], [address] [place], [name] [address]  SARS Coronavirus 2 by RT PCR (hospital order, performed in [facility] hospital lab) *cepheid single result test* Anterior Nasal Swab     Status: None   Collection Time: 07/07/23  9:15 AM   Specimen: Anterior Nasal Swab  Result Value Ref Range Status   SARS Coronavirus 2 by RT PCR NEGATIVE NEGATIVE Final    Comment: (NOTE) SARS-CoV-2 target nucleic acids are NOT DETECTED.  The SARS-CoV-2 RNA is generally detectable in upper and lower respiratory specimens during the acute phase of infection. The lowest concentration of SARS-CoV-2 viral copies this assay can detect is 250 copies / mL. A negative result does not preclude SARS-CoV-2 infection and should not be used as the sole basis for treatment or other patient management decisions.  A negative result may occur with improper specimen collection / handling, submission of specimen other than nasopharyngeal swab, presence of viral mutation(s) within the areas targeted by this assay, and inadequate number of viral copies (<250 copies /  mL). A negative result must be combined with clinical observations, patient history, and epidemiological information.  Fact Sheet for Patients:   RoadLapTop.co.za  Fact Sheet for Healthcare Providers: http://kim-miller.com/  This test is not yet approved or  cleared by the [place] FDA and has been authorized for detection and/or diagnosis of SARS-CoV-2 by FDA under an Emergency Use Authorization (EUA).  This EUA will remain in effect (meaning this test can be used) for the duration of the COVID-19 declaration under Section 564(b)(1) of the Act, 21 U.S.C. section 360bbb-3(b)(1), unless the authorization is terminated or revoked  sooner.  Performed at [facility], [address] [place], [name] [address]   Respiratory (~20 pathogens) panel by PCR     Status: None   Collection Time: 07/07/23  9:15 AM   Specimen: Nasopharyngeal Swab; Respiratory  Result Value Ref Range Status   Adenovirus NOT DETECTED NOT DETECTED Final   Coronavirus 229E NOT DETECTED NOT DETECTED Final    Comment: (NOTE) The Coronavirus on the Respiratory Panel, DOES NOT test for the novel  Coronavirus (2019 nCoV)    Coronavirus HKU1 NOT DETECTED NOT DETECTED Final   Coronavirus NL63 NOT DETECTED NOT DETECTED Final   Coronavirus OC43 NOT DETECTED NOT DETECTED Final   Metapneumovirus NOT DETECTED NOT DETECTED Final   Rhinovirus / Enterovirus NOT DETECTED NOT DETECTED Final   Influenza A NOT DETECTED NOT DETECTED Final   Influenza B NOT DETECTED NOT DETECTED Final   Parainfluenza Virus 1 NOT DETECTED NOT DETECTED Final   Parainfluenza Virus 2 NOT DETECTED NOT DETECTED Final   Parainfluenza Virus 3 NOT DETECTED NOT DETECTED Final   Parainfluenza Virus 4 NOT DETECTED NOT DETECTED Final   Respiratory Syncytial Virus NOT DETECTED NOT DETECTED Final   Bordetella pertussis NOT DETECTED NOT DETECTED Final   Bordetella Parapertussis NOT DETECTED NOT DETECTED Final   Chlamydophila pneumoniae NOT DETECTED NOT DETECTED Final   Mycoplasma pneumoniae NOT DETECTED NOT DETECTED Final    Comment: Performed at [facility], [address], [place], [name] [address]  Culture, blood (Routine X 2) w Reflex to ID Panel     Status: None   Collection Time: 07/08/23 11:41 AM   Specimen: BLOOD LEFT ARM  Result Value Ref Range Status   Specimen Description   Final    BLOOD LEFT ARM Performed at [facility], [address] [place], [name] [address]    Special Requests   Final    BOTTLES DRAWN AEROBIC AND ANAEROBIC Blood Culture adequate volume Performed at [facility], [address], [place], [name]  [address]    Culture   Final    NO GROWTH 5 DAYS Performed at [facility], [address], [place], [name] [address]    Report Status 07/13/2023 FINAL  Final  Culture, blood (Routine X 2) w Reflex to ID Panel     Status: None   Collection Time: 07/08/23 11:51 AM   Specimen: BLOOD RIGHT ARM  Result Value Ref Range Status   Specimen Description   Final    BLOOD RIGHT ARM Performed at [facility], [address] [place], [name] [address]    Special Requests   Final    BOTTLES DRAWN AEROBIC AND ANAEROBIC Blood Culture adequate volume Performed at [facility], [address] [place], [name] [address]    Culture   Final    NO GROWTH 5 DAYS Performed at [facility], [address] [place], [name] [address]    Report Status 07/13/2023 FINAL  Final    Antimicrobials/Microbiology: Anti-infectives (From admission, onward)    None         Component Value Date/Time   SDES  07/08/2023 1151    BLOOD RIGHT ARM Performed at [facility], [address], [place], [name] [address]    SPECREQUEST  07/08/2023 1151    BOTTLES DRAWN AEROBIC AND ANAEROBIC Blood Culture adequate volume Performed at [facility], [address], [place], [name] [address]    CULT  07/08/2023 1151    NO GROWTH 5 DAYS Performed at [facility], [address] [place], [name] [address]    REPTSTATUS 07/13/2023 FINAL 07/08/2023 1151     Radiology Studies: No results found.   LOS: 9 days   Total time spent in review of labs and imaging, patient evaluation, formulation of plan, documentation and communication with family: 25 minutes  Lanae Boast, MD  [facility]  07/14/2023, 10:35 AM

## 2023-07-15 DIAGNOSIS — E876 Hypokalemia: Secondary | ICD-10-CM | POA: Diagnosis not present

## 2023-07-15 MED ORDER — VITAMIN B-1 100 MG PO TABS: 100.0000 mg | ORAL_TABLET | Freq: Every day | ORAL | 0 refills | Status: DC

## 2023-07-15 MED ORDER — ADULT MULTIVITAMIN W/MINERALS CH: 1.0000 | ORAL_TABLET | Freq: Every day | ORAL | 0 refills | Status: DC

## 2023-07-15 MED ORDER — ENSURE ENLIVE PO LIQD: 237.0000 mL | Freq: Three times a day (TID) | ORAL | 0 refills | Status: DC

## 2023-07-15 MED ORDER — FUROSEMIDE 20 MG PO TABS: 20.0000 mg | ORAL_TABLET | Freq: Every day | ORAL | 0 refills | Status: DC

## 2023-07-15 MED ORDER — LACTULOSE 10 GM/15ML PO SOLN: 20.0000 g | Freq: Two times a day (BID) | ORAL | 0 refills | Status: DC

## 2023-07-15 MED ORDER — MAGNESIUM OXIDE -MG SUPPLEMENT 400 (240 MG) MG PO TABS: 400.0000 mg | ORAL_TABLET | Freq: Every day | ORAL | 0 refills | Status: DC

## 2023-07-15 MED ORDER — FOLIC ACID 1 MG PO TABS: 1.0000 mg | ORAL_TABLET | Freq: Every day | ORAL | 0 refills | Status: DC

## 2023-07-15 NOTE — Discharge Summary (Signed)
Physician Discharge Summary  Richard Andrews UJW:[id number] DOB: [birth year] DOA: 07/05/2023  PCP: [name], NP  Admit date: 07/05/2023 Discharge date: 07/15/2023  Admitted From: Home Disposition:  SNF ([facility]  Recommendations for Outpatient Follow-up:  Follow up with PCP in 1-2 weeks Please obtain BMP/CBC in one week Follow-up with GI outpatient  Home Health: None Equipment/Devices: None Discharge Condition: Stable CODE STATUS: DNR Diet recommendation: Regular diet  Brief/Interim Summary: 63 y.o. 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  On admission patient had workup including CT head chest x-ray shoulder x-ray-chronic finding no acute changes Underwent further workup echo unremarkable, TSH stable starvation) improving electrolyte imbalance has at this time improved.  No withdrawal signs at this time and remains stable but weak and deconditioned and planning for PT OT skilled nursing facility   Generalized weakness/deconditioning debility: Hypomagnesemia Hypophosphatemia Hyponatremia -Patient with weakness debility in the setting of electrolyte balance ketosis refeeding syndrome. -Workup including echo stable TSH normal  -PT/OT recommended SNF.   Starvation alcoholic ketosis Refeeding syndrome -Treated and resolved.  Tolerating diet   Alcohol use disorder, severe, dependence -Has history of alcohol withdrawal seizures in the past and history DTs during last admission.   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  Augment diet and supplement   Pancytopenia: -Secondary to chronic alcohol abuse   Right lateral 4th rib fracture -This was noted incidentally in evaluation of right arm pain. Appeared subacute on x-ray.  In conjunction with multiple old healed rib fractures.   -Denies any pain   Fever Positive blood culture -New fever overnight 1/24.  Urinalysis 1/22 negative.  CXR clear.  Blood cultures 1/2 positive, but suspected contaminant.   -No further fever, no antibiotics given, now feels well.      Chronic subdural hematoma -CT imaging on admission showed no change   Essential hypertension -Not adherent to meds at home. remained stable in the hospital.  Discharge Diagnoses:  Generalized weakness Deconditioning Hypomagnesemia Hypophosphatemia Hyponatremia Refeeding syndrome Starvation alcoholic ketoacidosis Alcohol use disorder, severe, dependence Anemia of chronic disease in the setting of alcohol abuse Alcohol cirrhosis/fatty infiltration Hepatic encephalopathy Severe protein calorie malnutrition Pancytopenia Right lateral fourth rib fracture Fever/positive blood culture Chronic subdural hematoma Essential hypertension    Discharge Instructions  Discharge Instructions     Diet - low sodium heart healthy   Complete by: As directed    Increase activity slowly   Complete by: As directed    No wound care   Complete by: As directed       Allergies as of 07/15/2023   No Known Allergies      Medication List     TAKE these medications    feeding supplement  Liqd Take 237 mLs by mouth 3 (three) times  daily between meals.   folic acid 1 MG tablet Commonly known as: FOLVITE Take 1 tablet (1 mg total) by mouth daily. Start taking on: July 16, 2023   furosemide 20 MG tablet Commonly known as: LASIX Take 1 tablet (20 mg total) by mouth daily. Start taking on: July 16, 2023   lactulose 10 GM/15ML solution Commonly known as: CHRONULAC Take 30 mLs (20 g total) by mouth 2 (two) times daily.   magnesium oxide 400 (240 Mg) MG tablet Commonly known as: MAG-OX Take 1 tablet (400 mg total) by mouth daily. Start taking on: July 16, 2023   multivitamin with minerals Tabs tablet Take 1 tablet by mouth daily. Start taking on: July 16, 2023   thiamine 100 MG tablet Commonly known as: Vitamin B-1 Take 1 tablet (100 mg total) by mouth daily. Start taking on: July 16, 2023        Contact information for follow-up providers     [name], NP Follow up in 1 week(s).   Specialty: Nurse Practitioner Contact information: [address] [name] [address] [phone]              Contact information for after-discharge care     Destination     [facility] Preferred SNF .   Service: Skilled Nursing Contact information: [address] [place] [address] [phone]                    No Known Allergies  Consultations: None   Procedures/Studies: DG Shoulder Right Result Date: 07/07/2023 CLINICAL DATA:  Right shoulder pain. EXAM: RIGHT SHOULDER - 2+ VIEW COMPARISON:  05/03/2023. FINDINGS: No acute fracture or dislocation. No aggressive osseous lesion. Stable appearance of right proximal humerus ORIF. Subacute right lateral fourth rib fracture noted. Old healed right posterior seventh through ninth rib fractures and lateral right seventh and eighth rib fractures noted. Fractures noted. Glenohumeral and acromioclavicular joints are normal in alignment. No  significant arthritis of acromio-clavicular joint. Mild degenerative changes of glenohumeral joints. No soft tissue swelling. No radiopaque foreign bodies. IMPRESSION: *No acute osseous abnormality of the right shoulder. *Subacute right lateral fourth rib fracture and multiple old healed right seventh through ninth rib fractures. Electronically Signed   By: Jules Schick M.D.   On: 07/07/2023 12:40   DG Chest 2 View Result Date: 07/07/2023 CLINICAL DATA:  Fever.  Dry cough. EXAM: CHEST - 2 VIEW COMPARISON:  05/03/2023. FINDINGS: Bilateral lung fields are clear. Bilateral costophrenic angles are clear. Normal cardio-mediastinal silhouette. No acute osseous abnormalities. Old healed bilateral rib fractures noted. The soft tissues are within normal limits. IMPRESSION: No active cardiopulmonary disease. Electronically Signed   By: Jules Schick M.D.   On: 07/07/2023 12:37   ECHOCARDIOGRAM COMPLETE Result Date: 07/06/2023    ECHOCARDIOGRAM REPORT   Patient Name:   KASHTON MCARTOR Date of Exam: 07/06/2023 Medical Rec #:  [id number]     Height:       70.0 in Accession #:    [id number]    Weight:       126.1 lb Date of Birth:  [birth year]     BSA:          1.716 m Patient Age:    62 years      BP:           103/73 mmHg Patient Gender: M             HR:  93 bpm. Exam Location:  Inpatient Procedure: 2D Echo, Cardiac Doppler, Color Doppler and Intracardiac            Opacification Agent Indications:    CHF  History:        Patient has prior history of Echocardiogram examinations, most                 recent 01/25/2023. Risk Factors:ETOH and Hypertension.  Sonographer:    Amy Chionchio Referring Phys: [id number] CHRISTOPHER P DANFORD IMPRESSIONS  1. Left ventricular ejection fraction, by estimation, is 55 to 60%. The left ventricle has normal function. The left ventricle has no regional wall motion abnormalities. Left ventricular diastolic parameters are indeterminate.  2. Right ventricular systolic function is normal.  The right ventricular size is normal.  3. The mitral valve is normal in structure. No evidence of mitral valve regurgitation. No evidence of mitral stenosis.  4. The aortic valve is normal in structure. Aortic valve regurgitation is not visualized. No aortic stenosis is present.  5. The inferior vena cava is normal in size with greater than 50% respiratory variability, suggesting right atrial pressure of 3 mmHg. FINDINGS  Left Ventricle: Left ventricular ejection fraction, by estimation, is 55 to 60%. The left ventricle has normal function. The left ventricle has no regional wall motion abnormalities. The left ventricular internal cavity size was normal in size. There is  no left ventricular hypertrophy. Left ventricular diastolic parameters are indeterminate. Right Ventricle: The right ventricular size is normal. No increase in right ventricular wall thickness. Right ventricular systolic function is normal. Left Atrium: Left atrial size was normal in size. Right Atrium: Right atrial size was normal in size. Pericardium: There is no evidence of pericardial effusion. Mitral Valve: The mitral valve is normal in structure. No evidence of mitral valve regurgitation. No evidence of mitral valve stenosis. MV peak gradient, 3.9 mmHg. The mean mitral valve gradient is 2.0 mmHg. Tricuspid Valve: The tricuspid valve is normal in structure. Tricuspid valve regurgitation is not demonstrated. No evidence of tricuspid stenosis. Aortic Valve: The aortic valve is normal in structure. Aortic valve regurgitation is not visualized. No aortic stenosis is present. Aortic valve mean gradient measures 4.0 mmHg. Aortic valve peak gradient measures 6.9 mmHg. Aortic valve area, by VTI measures 2.82 cm. Pulmonic Valve: The pulmonic valve was normal in structure. Pulmonic valve regurgitation is not visualized. No evidence of pulmonic stenosis. Aorta: The aortic root is normal in size and structure. Venous: The inferior vena cava is normal in  size with greater than 50% respiratory variability, suggesting right atrial pressure of 3 mmHg. IAS/Shunts: No atrial level shunt detected by color flow Doppler.  LEFT VENTRICLE PLAX 2D LVIDd:         4.90 cm     Diastology LVIDs:         3.10 cm     LV e' medial:    7.40 cm/s LV PW:         0.70 cm     LV E/e' medial:  8.0 LV IVS:        1.00 cm     LV e' lateral:   6.96 cm/s LVOT diam:     2.00 cm     LV E/e' lateral: 8.5 LV SV:         53 LV SV Index:   31 LVOT Area:     3.14 cm  LV Volumes (MOD) LV vol d, MOD A4C: 47.3 ml LV vol s, MOD A4C: 15.0  ml LV SV MOD A4C:     47.3 ml RIGHT VENTRICLE          IVC RV Basal diam:  2.70 cm  IVC diam: 1.40 cm TAPSE (M-mode): 2.0 cm LEFT ATRIUM             Index        RIGHT ATRIUM           Index LA Vol (A2C):   34.2 ml 19.93 ml/m  RA Area:     11.40 cm LA Vol (A4C):   36.4 ml 21.21 ml/m  RA Volume:   27.60 ml  16.09 ml/m LA Biplane Vol: 35.3 ml 20.57 ml/m  AORTIC VALVE AV Area (Vmax):    2.78 cm AV Area (Vmean):   2.60 cm AV Area (VTI):     2.82 cm AV Vmax:           131.00 cm/s AV Vmean:          91.200 cm/s AV VTI:            0.188 m AV Peak Grad:      6.9 mmHg AV Mean Grad:      4.0 mmHg LVOT Vmax:         116.00 cm/s LVOT Vmean:        75.500 cm/s LVOT VTI:          0.169 m LVOT/AV VTI ratio: 0.90 MITRAL VALVE               TRICUSPID VALVE MV Area (PHT): 3.13 cm    TR Peak grad:   18.0 mmHg MV Area VTI:   2.22 cm    TR Vmax:        212.00 cm/s MV Peak grad:  3.9 mmHg MV Mean grad:  2.0 mmHg    SHUNTS MV Vmax:       0.98 m/s    Systemic VTI:  0.17 m MV Vmean:      70.1 cm/s   Systemic Diam: 2.00 cm MV Decel Time: 242 msec MV E velocity: 59.10 cm/s MV A velocity: 72.20 cm/s MV E/A ratio:  0.82 Kardie Tobb DO Electronically signed by Thomasene Ripple DO Signature Date/Time: 07/06/2023/1:25:42 PM    Final    CT Head Wo Contrast Result Date: 07/05/2023 CLINICAL DATA:  Altered mental status. EXAM: CT HEAD WITHOUT CONTRAST TECHNIQUE: Contiguous axial images were  obtained from the base of the skull through the vertex without intravenous contrast. RADIATION DOSE REDUCTION: This exam was performed according to the departmental dose-optimization program which includes automated exposure control, adjustment of the mA and/or kV according to patient size and/or use of iterative reconstruction technique. COMPARISON:  Head CT dated 05/04/2023. FINDINGS: Brain: Moderate age-related atrophy and chronic microvascular ischemic changes. Right hemispheric mixed density subdural collection measures approximately 5 mm in thickness and appears similar or slightly decreased in size compared to prior CT. Stable arachnoid cyst in the left middle cranial fossa. No midline shift. Vascular: No hyperdense vessel or unexpected calcification. Similar appearance of post procedural changes from prior embolization of right middle meningeal artery. Skull: Normal. Negative for fracture or focal lesion. Sinuses/Orbits: Partially visualized left maxillary sinus retention cyst or polyp. No air-fluid level. Other: None IMPRESSION: 1. Relatively similar mixed density right frontal convexity subdural collection. 2. Moderate age-related atrophy and chronic microvascular ischemic changes. Electronically Signed   By: Elgie Collard M.D.   On: 07/05/2023 14:11      Subjective: Patient seen and examined.  Sitting comfortably on the bed.  Denies any complaints today.  Comfortable going to SNF.  Discharge Exam: Vitals:   07/14/23 2138 07/15/23 0538  BP: 97/73 111/79  Pulse: 72 71  Resp: 19 19  Temp: 98.9 F (37.2 C) 99.2 F (37.3 C)  SpO2: 99% 99%   Vitals:   07/14/23 0536 07/14/23 1407 07/14/23 2138 07/15/23 0538  BP: 102/68 107/78 97/73 111/79  Pulse: 67 70 72 71  Resp: 15 16 19 19   Temp: 99.2 F (37.3 C) 98.5 F (36.9 C) 98.9 F (37.2 C) 99.2 F (37.3 C)  TempSrc: Oral Oral Oral Oral  SpO2: 99% 100% 99% 99%  Weight:      Height:        General: Pt is alert, awake, not in acute  distress, he appears older than his stated age.  On room air, communicating well. Cardiovascular: RRR, S1/S2 +, no rubs, no gallops Respiratory: CTA bilaterally, no wheezing, no rhonchi Abdominal: Soft, NT, ND, bowel sounds + Extremities: no edema, no cyanosis    The results of significant diagnostics from this hospitalization (including imaging, microbiology, ancillary and laboratory) are listed below for reference.     Microbiology: Recent Results (from the past 240 hours)  Culture, blood (Routine X 2) w Reflex to ID Panel     Status: None   Collection Time: 07/05/23  5:46 PM   Specimen: BLOOD RIGHT ARM  Result Value Ref Range Status   Specimen Description   Final    BLOOD RIGHT ARM Performed at [facility], [address] [place], [name] [address]    Special Requests   Final    BOTTLES DRAWN AEROBIC ONLY Blood Culture results may not be optimal due to an inadequate volume of blood received in culture bottles Performed at [facility], [address] [place], [name] [address]    Culture   Final    NO GROWTH 5 DAYS Performed at [facility], [address], [place], [name] [address]    Report Status 07/10/2023 FINAL  Final  Culture, blood (Routine X 2) w Reflex to ID Panel     Status: Abnormal   Collection Time: 07/05/23  5:46 PM   Specimen: BLOOD RIGHT HAND  Result Value Ref Range Status   Specimen Description   Final    BLOOD RIGHT HAND Performed at [facility], [address] [place], [name] [address]    Special Requests   Final    BOTTLES DRAWN AEROBIC ONLY Blood Culture results may not be optimal due to an inadequate volume of blood received in culture bottles Performed at [facility], [address], [place], [name] [address]    Culture  Setup Time   Final    GRAM POSITIVE COCCI IN CLUSTERS AEROBIC BOTTLE ONLY CRITICAL RESULT CALLED TO, READ BACK BY AND VERIFIED WITH: PHARMD E. Jean Rosenthal [id number] @ 2200  FH    Culture (A)  Final    STAPHYLOCOCCUS EPIDERMIDIS THE SIGNIFICANCE OF ISOLATING THIS ORGANISM FROM A SINGLE SET OF BLOOD CULTURES WHEN MULTIPLE SETS ARE DRAWN IS UNCERTAIN. PLEASE NOTIFY THE MICROBIOLOGY DEPARTMENT WITHIN ONE WEEK IF SPECIATION AND SENSITIVITIES ARE REQUIRED. Performed at [facility], [address], [place], [name] [address]    Report Status 07/07/2023 FINAL  Final  Blood Culture ID Panel (Reflexed)     Status: Abnormal   Collection Time: 07/05/23  5:46 PM  Result Value Ref Range Status  Enterococcus faecalis NOT DETECTED NOT DETECTED Final   Enterococcus Faecium NOT DETECTED NOT DETECTED Final   Listeria monocytogenes NOT DETECTED NOT DETECTED Final   Staphylococcus species DETECTED (A) NOT DETECTED Final    Comment: CRITICAL RESULT CALLED TO, READ BACK BY AND VERIFIED WITH: PHARMD E. Jean Rosenthal [id number] @ 2200 FH    Staphylococcus aureus (BCID) NOT DETECTED NOT DETECTED Final   Staphylococcus epidermidis DETECTED (A) NOT DETECTED Final    Comment: CRITICAL RESULT CALLED TO, READ BACK BY AND VERIFIED WITH: PHARMD EJean Rosenthal [id number] @ 2200 FH    Staphylococcus lugdunensis NOT DETECTED NOT DETECTED Final   Streptococcus species NOT DETECTED NOT DETECTED Final   Streptococcus agalactiae NOT DETECTED NOT DETECTED Final   Streptococcus pneumoniae NOT DETECTED NOT DETECTED Final   Streptococcus pyogenes NOT DETECTED NOT DETECTED Final   A.calcoaceticus-baumannii NOT DETECTED NOT DETECTED Final   Bacteroides fragilis NOT DETECTED NOT DETECTED Final   Enterobacterales NOT DETECTED NOT DETECTED Final   Enterobacter cloacae complex NOT DETECTED NOT DETECTED Final   Escherichia coli NOT DETECTED NOT DETECTED Final   Klebsiella aerogenes NOT DETECTED NOT DETECTED Final   Klebsiella oxytoca NOT DETECTED NOT DETECTED Final   Klebsiella pneumoniae NOT DETECTED NOT DETECTED Final   Proteus species NOT DETECTED NOT DETECTED Final   Salmonella species NOT DETECTED NOT  DETECTED Final   Serratia marcescens NOT DETECTED NOT DETECTED Final   Haemophilus influenzae NOT DETECTED NOT DETECTED Final   Neisseria meningitidis NOT DETECTED NOT DETECTED Final   Pseudomonas aeruginosa NOT DETECTED NOT DETECTED Final   Stenotrophomonas maltophilia NOT DETECTED NOT DETECTED Final   Candida albicans NOT DETECTED NOT DETECTED Final   Candida auris NOT DETECTED NOT DETECTED Final   Candida glabrata NOT DETECTED NOT DETECTED Final   Candida krusei NOT DETECTED NOT DETECTED Final   Candida parapsilosis NOT DETECTED NOT DETECTED Final   Candida tropicalis NOT DETECTED NOT DETECTED Final   Cryptococcus neoformans/gattii NOT DETECTED NOT DETECTED Final   Methicillin resistance mecA/C NOT DETECTED NOT DETECTED Final    Comment: Performed at [facility], [address], [place], [name] [address]  SARS Coronavirus 2 by RT PCR (hospital order, performed in [facility] hospital lab) *cepheid single result test* Anterior Nasal Swab     Status: None   Collection Time: 07/07/23  9:15 AM   Specimen: Anterior Nasal Swab  Result Value Ref Range Status   SARS Coronavirus 2 by RT PCR NEGATIVE NEGATIVE Final    Comment: (NOTE) SARS-CoV-2 target nucleic acids are NOT DETECTED.  The SARS-CoV-2 RNA is generally detectable in upper and lower respiratory specimens during the acute phase of infection. The lowest concentration of SARS-CoV-2 viral copies this assay can detect is 250 copies / mL. A negative result does not preclude SARS-CoV-2 infection and should not be used as the sole basis for treatment or other patient management decisions.  A negative result may occur with improper specimen collection / handling, submission of specimen other than nasopharyngeal swab, presence of viral mutation(s) within the areas targeted by this assay, and inadequate number of viral copies (<250 copies / mL). A negative result must be combined with clinical observations, patient history, and  epidemiological information.  Fact Sheet for Patients:   RoadLapTop.co.za  Fact Sheet for Healthcare Providers: http://kim-miller.com/  This test is not yet approved or  cleared by the [place] FDA and has been authorized for detection and/or diagnosis of SARS-CoV-2 by FDA under an Emergency Use Authorization (EUA).  This EUA will remain in effect (meaning this test can be used) for the duration of the COVID-19 declaration under Section 564(b)(1) of the Act, 21 U.S.C. section 360bbb-3(b)(1), unless the authorization is terminated or revoked sooner.  Performed at [facility], [address], [place], [name] [address]   Respiratory (~20 pathogens) panel by PCR     Status: None   Collection Time: 07/07/23  9:15 AM   Specimen: Nasopharyngeal Swab; Respiratory  Result Value Ref Range Status   Adenovirus NOT DETECTED NOT DETECTED Final   Coronavirus 229E NOT DETECTED NOT DETECTED Final    Comment: (NOTE) The Coronavirus on the Respiratory Panel, DOES NOT test for the novel  Coronavirus (2019 nCoV)    Coronavirus HKU1 NOT DETECTED NOT DETECTED Final   Coronavirus NL63 NOT DETECTED NOT DETECTED Final   Coronavirus OC43 NOT DETECTED NOT DETECTED Final   Metapneumovirus NOT DETECTED NOT DETECTED Final   Rhinovirus / Enterovirus NOT DETECTED NOT DETECTED Final   Influenza A NOT DETECTED NOT DETECTED Final   Influenza B NOT DETECTED NOT DETECTED Final   Parainfluenza Virus 1 NOT DETECTED NOT DETECTED Final   Parainfluenza Virus 2 NOT DETECTED NOT DETECTED Final   Parainfluenza Virus 3 NOT DETECTED NOT DETECTED Final   Parainfluenza Virus 4 NOT DETECTED NOT DETECTED Final   Respiratory Syncytial Virus NOT DETECTED NOT DETECTED Final   Bordetella pertussis NOT DETECTED NOT DETECTED Final   Bordetella Parapertussis NOT DETECTED NOT DETECTED Final   Chlamydophila pneumoniae NOT DETECTED NOT DETECTED Final   Mycoplasma  pneumoniae NOT DETECTED NOT DETECTED Final    Comment: Performed at [facility], [address] [place], [name] [address]  Culture, blood (Routine X 2) w Reflex to ID Panel     Status: None   Collection Time: 07/08/23 11:41 AM   Specimen: BLOOD LEFT ARM  Result Value Ref Range Status   Specimen Description   Final    BLOOD LEFT ARM Performed at [facility], [address] [place], [name] [address]    Special Requests   Final    BOTTLES DRAWN AEROBIC AND ANAEROBIC Blood Culture adequate volume Performed at [facility], [address] [place], [name] [address]    Culture   Final    NO GROWTH 5 DAYS Performed at [facility], [address], [place], [name] [address]    Report Status 07/13/2023 FINAL  Final  Culture, blood (Routine X 2) w Reflex to ID Panel     Status: None   Collection Time: 07/08/23 11:51 AM   Specimen: BLOOD RIGHT ARM  Result Value Ref Range Status   Specimen Description   Final    BLOOD RIGHT ARM Performed at [facility], [address], [place], [name] [address]    Special Requests   Final    BOTTLES DRAWN AEROBIC AND ANAEROBIC Blood Culture adequate volume Performed at [facility], [address], [place], [name] [address]    Culture   Final    NO GROWTH 5 DAYS Performed at [facility], [address] [place], [name] [address]    Report Status 07/13/2023 FINAL  Final     Labs: BNP (last 3 results) Recent Labs    05/05/23 0338 05/06/23 0345 05/07/23 0359  BNP 67.8 23.7 10.2   Basic Metabolic Panel: Recent Labs  Lab 07/09/23 0654 07/12/23 0508 07/13/23 0717  NA 131* 129* 132*  K  3.8 3.4* 3.5  CL 100 94* 100  CO2 23 23 22   GLUCOSE 138* 95 100*  BUN 9 13 11   CREATININE 0.47* 0.32* 0.53*  CALCIUM 8.7* 8.7* 8.8*  MG  --  1.7  --   PHOS  --  3.8  --    Liver Function Tests: Recent Labs  Lab 07/12/23 0508  AST 66*  ALT 35  ALKPHOS 89  BILITOT 0.9   PROT 6.6  ALBUMIN 2.9*   No results for input(s): "LIPASE", "AMYLASE" in the last 168 hours. No results for input(s): "AMMONIA" in the last 168 hours. CBC: Recent Labs  Lab 07/09/23 0654 07/12/23 0508  WBC 3.0* 3.6*  HGB 9.2* 8.6*  HCT 28.3* 26.5*  MCV 106.0* 105.2*  PLT 101* 182   Cardiac Enzymes: No results for input(s): "CKTOTAL", "CKMB", "CKMBINDEX", "TROPONINI" in the last 168 hours. BNP: Invalid input(s): "POCBNP" CBG: No results for input(s): "GLUCAP" in the last 168 hours. D-Dimer No results for input(s): "DDIMER" in the last 72 hours. Hgb A1c No results for input(s): "HGBA1C" in the last 72 hours. Lipid Profile No results for input(s): "CHOL", "HDL", "LDLCALC", "TRIG", "CHOLHDL", "LDLDIRECT" in the last 72 hours. Thyroid function studies No results for input(s): "TSH", "T4TOTAL", "T3FREE", "THYROIDAB" in the last 72 hours.  Invalid input(s): "FREET3" Anemia work up No results for input(s): "VITAMINB12", "FOLATE", "FERRITIN", "TIBC", "IRON", "RETICCTPCT" in the last 72 hours. Urinalysis    Component Value Date/Time   COLORURINE AMBER (A) 07/05/2023 1753   APPEARANCEUR CLEAR 07/05/2023 1753   LABSPEC 1.026 07/05/2023 1753   PHURINE 6.0 07/05/2023 1753   GLUCOSEU NEGATIVE 07/05/2023 1753   HGBUR NEGATIVE 07/05/2023 1753   BILIRUBINUR SMALL (A) 07/05/2023 1753   KETONESUR 5 (A) 07/05/2023 1753   PROTEINUR 30 (A) 07/05/2023 1753   NITRITE NEGATIVE 07/05/2023 1753   LEUKOCYTESUR NEGATIVE 07/05/2023 1753   Sepsis Labs Recent Labs  Lab 07/09/23 0654 07/12/23 0508  WBC 3.0* 3.6*   Microbiology Recent Results (from the past 240 hours)  Culture, blood (Routine X 2) w Reflex to ID Panel     Status: None   Collection Time: 07/05/23  5:46 PM   Specimen: BLOOD RIGHT ARM  Result Value Ref Range Status   Specimen Description   Final    BLOOD RIGHT ARM Performed at [facility], [address] [place], [name] [address]    Special Requests   Final     BOTTLES DRAWN AEROBIC ONLY Blood Culture results may not be optimal due to an inadequate volume of blood received in culture bottles Performed at [facility], [address] [place], [name] [address]    Culture   Final    NO GROWTH 5 DAYS Performed at [facility], [address], [place], [name] [address]    Report Status 07/10/2023 FINAL  Final  Culture, blood (Routine X 2) w Reflex to ID Panel     Status: Abnormal   Collection Time: 07/05/23  5:46 PM   Specimen: BLOOD RIGHT HAND  Result Value Ref Range Status   Specimen Description   Final    BLOOD RIGHT HAND Performed at [facility], [address], [place], [name] [address]    Special Requests   Final    BOTTLES DRAWN AEROBIC ONLY Blood Culture results may not be optimal due to an inadequate volume of blood received in culture bottles Performed at [facility], [address] [place], [name]  [address]    Culture  Setup Time   Final    GRAM POSITIVE COCCI IN CLUSTERS AEROBIC BOTTLE ONLY CRITICAL RESULT CALLED TO, READ BACK BY AND VERIFIED WITH: PHARMD E. Jean Rosenthal [phone] @ 2200 FH    Culture (A)  Final    STAPHYLOCOCCUS EPIDERMIDIS THE SIGNIFICANCE OF ISOLATING THIS ORGANISM FROM A SINGLE SET OF BLOOD CULTURES WHEN MULTIPLE SETS ARE DRAWN IS UNCERTAIN. PLEASE NOTIFY THE MICROBIOLOGY DEPARTMENT WITHIN ONE WEEK IF SPECIATION AND SENSITIVITIES ARE REQUIRED. Performed at [facility], [address], [place], [name] [address]    Report Status 07/07/2023 FINAL  Final  Blood Culture ID Panel (Reflexed)     Status: Abnormal   Collection Time: 07/05/23  5:46 PM  Result Value Ref Range Status   Enterococcus faecalis NOT DETECTED NOT DETECTED Final   Enterococcus Faecium NOT DETECTED NOT DETECTED Final   Listeria monocytogenes NOT DETECTED NOT DETECTED Final   Staphylococcus species DETECTED (A) NOT DETECTED Final    Comment: CRITICAL RESULT CALLED TO, READ BACK BY AND  VERIFIED WITH: PHARMD EJean Rosenthal [phone] @ 2200 FH    Staphylococcus aureus (BCID) NOT DETECTED NOT DETECTED Final   Staphylococcus epidermidis DETECTED (A) NOT DETECTED Final    Comment: CRITICAL RESULT CALLED TO, READ BACK BY AND VERIFIED WITH: PHARMD EJean Rosenthal [phone] @ 2200 FH    Staphylococcus lugdunensis NOT DETECTED NOT DETECTED Final   Streptococcus species NOT DETECTED NOT DETECTED Final   Streptococcus agalactiae NOT DETECTED NOT DETECTED Final   Streptococcus pneumoniae NOT DETECTED NOT DETECTED Final   Streptococcus pyogenes NOT DETECTED NOT DETECTED Final   A.calcoaceticus-baumannii NOT DETECTED NOT DETECTED Final   Bacteroides fragilis NOT DETECTED NOT DETECTED Final   Enterobacterales NOT DETECTED NOT DETECTED Final   Enterobacter cloacae complex NOT DETECTED NOT DETECTED Final   Escherichia coli NOT DETECTED NOT DETECTED Final   Klebsiella aerogenes NOT DETECTED NOT DETECTED Final   Klebsiella oxytoca NOT DETECTED NOT DETECTED Final   Klebsiella pneumoniae NOT DETECTED NOT DETECTED Final   Proteus species NOT DETECTED NOT DETECTED Final   Salmonella species NOT DETECTED NOT DETECTED Final   Serratia marcescens NOT DETECTED NOT DETECTED Final   Haemophilus influenzae NOT DETECTED NOT DETECTED Final   Neisseria meningitidis NOT DETECTED NOT DETECTED Final   Pseudomonas aeruginosa NOT DETECTED NOT DETECTED Final   Stenotrophomonas maltophilia NOT DETECTED NOT DETECTED Final   Candida albicans NOT DETECTED NOT DETECTED Final   Candida auris NOT DETECTED NOT DETECTED Final   Candida glabrata NOT DETECTED NOT DETECTED Final   Candida krusei NOT DETECTED NOT DETECTED Final   Candida parapsilosis NOT DETECTED NOT DETECTED Final   Candida tropicalis NOT DETECTED NOT DETECTED Final   Cryptococcus neoformans/gattii NOT DETECTED NOT DETECTED Final   Methicillin resistance mecA/C NOT DETECTED NOT DETECTED Final    Comment: Performed at [facility], [address],  [place], [name] [address]  SARS Coronavirus 2 by RT PCR (hospital order, performed in [facility] hospital lab) *cepheid single result test* Anterior Nasal Swab     Status: None   Collection Time: 07/07/23  9:15 AM   Specimen: Anterior Nasal Swab  Result Value Ref Range Status   SARS Coronavirus 2 by RT PCR NEGATIVE NEGATIVE Final    Comment: (NOTE) SARS-CoV-2 target nucleic acids are NOT DETECTED.  The SARS-CoV-2 RNA is generally detectable in upper and lower respiratory specimens during the acute phase of infection. The lowest concentration of SARS-CoV-2 viral copies  this assay can detect is 250 copies / mL. A negative result does not preclude SARS-CoV-2 infection and should not be used as the sole basis for treatment or other patient management decisions.  A negative result may occur with improper specimen collection / handling, submission of specimen other than nasopharyngeal swab, presence of viral mutation(s) within the areas targeted by this assay, and inadequate number of viral copies (<250 copies / mL). A negative result must be combined with clinical observations, patient history, and epidemiological information.  Fact Sheet for Patients:   RoadLapTop.co.za  Fact Sheet for Healthcare Providers: http://kim-miller.com/  This test is not yet approved or  cleared by the [place] FDA and has been authorized for detection and/or diagnosis of SARS-CoV-2 by FDA under an Emergency Use Authorization (EUA).  This EUA will remain in effect (meaning this test can be used) for the duration of the COVID-19 declaration under Section 564(b)(1) of the Act, 21 U.S.C. section 360bbb-3(b)(1), unless the authorization is terminated or revoked sooner.  Performed at [facility], [address], [place], [name] [address]   Respiratory (~20 pathogens) panel by PCR     Status: None   Collection Time: 07/07/23  9:15 AM   Specimen:  Nasopharyngeal Swab; Respiratory  Result Value Ref Range Status   Adenovirus NOT DETECTED NOT DETECTED Final   Coronavirus 229E NOT DETECTED NOT DETECTED Final    Comment: (NOTE) The Coronavirus on the Respiratory Panel, DOES NOT test for the novel  Coronavirus (2019 nCoV)    Coronavirus HKU1 NOT DETECTED NOT DETECTED Final   Coronavirus NL63 NOT DETECTED NOT DETECTED Final   Coronavirus OC43 NOT DETECTED NOT DETECTED Final   Metapneumovirus NOT DETECTED NOT DETECTED Final   Rhinovirus / Enterovirus NOT DETECTED NOT DETECTED Final   Influenza A NOT DETECTED NOT DETECTED Final   Influenza B NOT DETECTED NOT DETECTED Final   Parainfluenza Virus 1 NOT DETECTED NOT DETECTED Final   Parainfluenza Virus 2 NOT DETECTED NOT DETECTED Final   Parainfluenza Virus 3 NOT DETECTED NOT DETECTED Final   Parainfluenza Virus 4 NOT DETECTED NOT DETECTED Final   Respiratory Syncytial Virus NOT DETECTED NOT DETECTED Final   Bordetella pertussis NOT DETECTED NOT DETECTED Final   Bordetella Parapertussis NOT DETECTED NOT DETECTED Final   Chlamydophila pneumoniae NOT DETECTED NOT DETECTED Final   Mycoplasma pneumoniae NOT DETECTED NOT DETECTED Final    Comment: Performed at [facility], [address] [place], [name] [address]  Culture, blood (Routine X 2) w Reflex to ID Panel     Status: None   Collection Time: 07/08/23 11:41 AM   Specimen: BLOOD LEFT ARM  Result Value Ref Range Status   Specimen Description   Final    BLOOD LEFT ARM Performed at [facility], [address] [place], [name] [address]    Special Requests   Final    BOTTLES DRAWN AEROBIC AND ANAEROBIC Blood Culture adequate volume Performed at [facility], [address] [place], [name] [address]    Culture   Final    NO GROWTH 5 DAYS Performed at [facility], [address] [place], [name] [address]    Report Status 07/13/2023 FINAL  Final  Culture, blood (Routine X 2) w Reflex to ID  Panel     Status: None   Collection Time: 07/08/23 11:51 AM   Specimen: BLOOD RIGHT ARM  Result Value Ref Range Status   Specimen Description  Final    BLOOD RIGHT ARM Performed at [facility], [address] [place], [name] [address]    Special Requests   Final    BOTTLES DRAWN AEROBIC AND ANAEROBIC Blood Culture adequate volume Performed at [facility], [address] [place], [name] [address]    Culture   Final    NO GROWTH 5 DAYS Performed at [facility], [address], [place], [name] [address]    Report Status 07/13/2023 FINAL  Final     Time coordinating discharge: Over 30 minutes  SIGNED:   Ollen Bowl, MD  [facility] 07/15/2023, 1:00 PM Pager   If 7PM-7AM, please contact night-coverage [email or web address]

## 2023-07-15 NOTE — TOC Transition Note (Signed)
Transition of Care [facility]) - Discharge Note   Patient Details  Name: Richard Andrews MRN: [id number] Date of Birth: April 30, 1961  Transition of Care [facility]) CM/SW Contact:  Adrian Prows, RN Phone Number: 07/15/2023, 1:11 PM   Clinical Narrative:    D/C orders received; spoke w/ Kia at [facility]; she gave RM # [id number], call report # [phone]; pt transport by [facility]; pt notified and agreed to d/c plan; attempted to contact pt's spouse [name]; LVM at [phone]; awaiting return call; [facility] called at [phone]; spoke w/ operator # [phone]; no TOC needs.  -[phone]- pt's wife [name] returned call; she was notified of d/c, and agrees to d/c plan; d/c summary and SNF transfer report sent via SNF hub; key also notified that transportation has been called.   Final next level of care: Skilled Nursing Facility Barriers to Discharge: No Barriers Identified   Patient Goals and CMS Choice Patient states their goals for this hospitalization and ongoing recovery are:: Wants to get better to go home. CMS Medicare.gov Compare Post Acute Care list provided to:: Patient Choice offered to / list presented to : Patient (refused) [facility] ownership interest in [facility].provided to::  (n/a)    Discharge Placement              Patient chooses bed at: [facility] Patient to be transferred to facility by: PT[facility] Name of family member notified: pt notified; LVM for [name] (spouse) [phone] Patient and family notified of of transfer: 07/15/23  Discharge Plan and Services Additional resources added to the After Visit Summary for   In-house Referral: NA Discharge Planning Services: CM Consult Post Acute Care Choice: Skilled Nursing Facility (Refused)          DME Arranged: N/A DME Agency: NA                  Social Drivers of Health (SDOH) Interventions SDOH Screenings   Food Insecurity: Food Insecurity Present (07/06/2023)  Housing: Unknown  (07/06/2023)  Transportation Needs: No Transportation Needs (07/06/2023)  Utilities: Not At Risk (07/06/2023)  Depression (PHQ2-9): Low Risk  (03/07/2023)  Social Connections: Socially Isolated (07/06/2023)  Tobacco Use: High Risk (07/05/2023)     Readmission Risk Interventions    07/11/2023    4:08 PM  Readmission Risk Prevention Plan  Transportation Screening Complete  PCP or Specialist Appt within 3-5 Days Complete  HRI or Home Care Consult Complete  Social Work Consult for Recovery Care Planning/Counseling Complete  Palliative Care Screening Not Applicable  Medication Review [facility]) Complete

## 2023-07-18 ENCOUNTER — Telehealth: Payer: Self-pay

## 2023-07-18 NOTE — Transitions of Care (Post Inpatient/ED Visit) (Signed)
   07/18/2023  Name: Richard Andrews MRN: [id number] DOB: [birth year]  Today's TOC FU Call Status: Today's TOC FU Call Status:: Successful TOC FU Call Completed TOC FU Call Complete Date: 07/18/23 Patient's Name and Date of Birth confirmed.  Transition Care Management Follow-up Telephone Call Date of Discharge: 07/17/23 Discharge Facility: Other [facility]) Name of Other (Non[facility]) Discharge Facility: [facility] Type of Discharge: Inpatient Admission Primary Inpatient Discharge Diagnosis:: fractured ribs How have you been since you were released from the hospital?: Better Any questions or concerns?: No  Items Reviewed: Did you receive and understand the discharge instructions provided?: Yes Medications obtained,verified, and reconciled?: Yes (Medications Reviewed) Any new allergies since your discharge?: No Dietary orders reviewed?: Yes Do you have support at home?: Yes People in Home: spouse  Medications Reviewed Today: Medications Reviewed Today     Reviewed by Emmitt Pan, LPN (Licensed Practical Nurse) on 07/18/23 at 1328  Med List Status: <None>   Medication Order Taking? Sig Documenting Provider Last Dose Status Informant  feeding supplement (ENSURE ENLIVE / ENSURE PLUS) LIQD [id number]  Take 237 mLs by mouth 3 (three) times daily between meals. Pahwani, Velna SAUNDERS, MD  Active   folic acid  (FOLVITE ) 1 MG tablet [id number]  Take 1 tablet (1 mg total) by mouth daily. Pahwani, Velna SAUNDERS, MD  Active   furosemide  (LASIX ) 20 MG tablet [id number]  Take 1 tablet (20 mg total) by mouth daily. Pahwani, Velna SAUNDERS, MD  Active   lactulose  (CHRONULAC ) 10 GM/15ML solution [id number]  Take 30 mLs (20 g total) by mouth 2 (two) times daily. Pahwani, Velna SAUNDERS, MD  Active   magnesium  oxide (MAG-OX) 400 (240 Mg) MG tablet [id number]  Take 1 tablet (400 mg total) by mouth daily. Pahwani, Rinka R, MD  Active   Multiple Vitamin (MULTIVITAMIN WITH MINERALS) TABS tablet [id number]  Take 1 tablet by  mouth daily. Pahwani, Rinka R, MD  Active   thiamine  (VITAMIN B-1) 100 MG tablet [id number]  Take 1 tablet (100 mg total) by mouth daily. Vernon Velna SAUNDERS, MD  Active             Home Care and Equipment/Supplies: Were Home Health Services Ordered?: Yes Name of Home Health Agency:: unknown Has Agency set up a time to come to your home?: No Any new equipment or medical supplies ordered?: NA  Functional Questionnaire: Do you need assistance with bathing/showering or dressing?: Yes Do you need assistance with meal preparation?: Yes Do you need assistance with eating?: No Do you have difficulty maintaining continence: Yes Do you need assistance with getting out of bed/getting out of a chair/moving?: Yes Do you have difficulty managing or taking your medications?: Yes  Follow up appointments reviewed: PCP Follow-up appointment confirmed?: Yes Date of PCP follow-up appointment?: 08/02/23 Follow-up Provider: [facility] Follow-up appointment confirmed?: NA Do you need transportation to your follow-up appointment?: No Do you understand care options if your condition(s) worsen?: Yes-patient verbalized understanding    SIGNATURE Pan Emmitt, LPN [facility] Nurse Health Advisor Direct Dial [phone]

## 2023-07-31 ENCOUNTER — Emergency Department (HOSPITAL_COMMUNITY): Payer: BC Managed Care – PPO

## 2023-07-31 ENCOUNTER — Other Ambulatory Visit: Payer: Self-pay

## 2023-07-31 ENCOUNTER — Inpatient Hospital Stay (HOSPITAL_COMMUNITY)
Admission: EM | Admit: 2023-07-31 | Discharge: 2023-08-11 | DRG: 640 | Disposition: A | Discharge status: Skilled Nursing Facility | Payer: BC Managed Care – PPO | Attending: Internal Medicine | Admitting: Internal Medicine

## 2023-07-31 DIAGNOSIS — E8729 Other acidosis: Principal | ICD-10-CM | POA: Diagnosis present

## 2023-07-31 DIAGNOSIS — R000 Tachycardia, unspecified: Secondary | ICD-10-CM | POA: Diagnosis present

## 2023-07-31 DIAGNOSIS — E872 Acidosis, unspecified: Secondary | ICD-10-CM | POA: Diagnosis present

## 2023-07-31 DIAGNOSIS — Z681 Body mass index (BMI) 19 or less, adult: Secondary | ICD-10-CM

## 2023-07-31 DIAGNOSIS — Z79899 Other long term (current) drug therapy: Secondary | ICD-10-CM

## 2023-07-31 DIAGNOSIS — R4189 Other symptoms and signs involving cognitive functions and awareness: Secondary | ICD-10-CM | POA: Diagnosis present

## 2023-07-31 DIAGNOSIS — D61818 Other pancytopenia: Secondary | ICD-10-CM | POA: Diagnosis present

## 2023-07-31 DIAGNOSIS — E860 Dehydration: Secondary | ICD-10-CM | POA: Diagnosis present

## 2023-07-31 DIAGNOSIS — E876 Hypokalemia: Secondary | ICD-10-CM | POA: Diagnosis present

## 2023-07-31 DIAGNOSIS — F10920 Alcohol use, unspecified with intoxication, uncomplicated: Secondary | ICD-10-CM

## 2023-07-31 DIAGNOSIS — Z807 Family history of other malignant neoplasms of lymphoid, hematopoietic and related tissues: Secondary | ICD-10-CM

## 2023-07-31 DIAGNOSIS — Y92009 Unspecified place in unspecified non-institutional (private) residence as the place of occurrence of the external cause: Principal | ICD-10-CM

## 2023-07-31 DIAGNOSIS — Z808 Family history of malignant neoplasm of other organs or systems: Secondary | ICD-10-CM

## 2023-07-31 DIAGNOSIS — F1027 Alcohol dependence with alcohol-induced persisting dementia: Secondary | ICD-10-CM | POA: Diagnosis present

## 2023-07-31 DIAGNOSIS — E871 Hypo-osmolality and hyponatremia: Secondary | ICD-10-CM | POA: Diagnosis not present

## 2023-07-31 DIAGNOSIS — Z87442 Personal history of urinary calculi: Secondary | ICD-10-CM

## 2023-07-31 DIAGNOSIS — F17210 Nicotine dependence, cigarettes, uncomplicated: Secondary | ICD-10-CM | POA: Diagnosis present

## 2023-07-31 DIAGNOSIS — Z8249 Family history of ischemic heart disease and other diseases of the circulatory system: Secondary | ICD-10-CM

## 2023-07-31 DIAGNOSIS — R569 Unspecified convulsions: Secondary | ICD-10-CM

## 2023-07-31 DIAGNOSIS — M79601 Pain in right arm: Secondary | ICD-10-CM | POA: Diagnosis present

## 2023-07-31 DIAGNOSIS — Y908 Blood alcohol level of 240 mg/100 ml or more: Secondary | ICD-10-CM | POA: Diagnosis present

## 2023-07-31 DIAGNOSIS — K760 Fatty (change of) liver, not elsewhere classified: Secondary | ICD-10-CM | POA: Diagnosis present

## 2023-07-31 DIAGNOSIS — F10930 Alcohol use, unspecified with withdrawal, uncomplicated: Principal | ICD-10-CM

## 2023-07-31 DIAGNOSIS — F10939 Alcohol use, unspecified with withdrawal, unspecified: Secondary | ICD-10-CM

## 2023-07-31 DIAGNOSIS — F10220 Alcohol dependence with intoxication, uncomplicated: Secondary | ICD-10-CM | POA: Diagnosis present

## 2023-07-31 DIAGNOSIS — Z83438 Family history of other disorder of lipoprotein metabolism and other lipidemia: Secondary | ICD-10-CM

## 2023-07-31 DIAGNOSIS — I6203 Nontraumatic chronic subdural hemorrhage: Secondary | ICD-10-CM | POA: Diagnosis present

## 2023-07-31 DIAGNOSIS — W19XXXA Unspecified fall, initial encounter: Secondary | ICD-10-CM | POA: Diagnosis present

## 2023-07-31 DIAGNOSIS — I10 Essential (primary) hypertension: Secondary | ICD-10-CM | POA: Diagnosis present

## 2023-07-31 DIAGNOSIS — K7460 Unspecified cirrhosis of liver: Secondary | ICD-10-CM | POA: Diagnosis present

## 2023-07-31 DIAGNOSIS — S40211A Abrasion of right shoulder, initial encounter: Secondary | ICD-10-CM | POA: Diagnosis present

## 2023-07-31 DIAGNOSIS — R7989 Other specified abnormal findings of blood chemistry: Secondary | ICD-10-CM

## 2023-07-31 DIAGNOSIS — Z66 Do not resuscitate: Secondary | ICD-10-CM | POA: Diagnosis present

## 2023-07-31 DIAGNOSIS — E43 Unspecified severe protein-calorie malnutrition: Secondary | ICD-10-CM | POA: Diagnosis present

## 2023-07-31 LAB — RESP PANEL BY RT-PCR (RSV, FLU A&B, COVID)  RVPGX2
Influenza A by PCR: NEGATIVE
Influenza B by PCR: NEGATIVE
Resp Syncytial Virus by PCR: NEGATIVE
SARS Coronavirus 2 by RT PCR: NEGATIVE

## 2023-07-31 LAB — CBC WITH DIFFERENTIAL/PLATELET
Abs Immature Granulocytes: 0.04 10*3/uL (ref 0.00–0.07)
Basophils Absolute: 0.1 10*3/uL (ref 0.0–0.1)
Basophils Relative: 1 %
Eosinophils Absolute: 0.1 10*3/uL (ref 0.0–0.5)
Eosinophils Relative: 2 %
HCT: 35.7 % — ABNORMAL LOW (ref 39.0–52.0)
Hemoglobin: 12.2 g/dL — ABNORMAL LOW (ref 13.0–17.0)
Immature Granulocytes: 1 %
Lymphocytes Relative: 36 %
Lymphs Abs: 2.2 10*3/uL (ref 0.7–4.0)
MCH: 34.7 pg — ABNORMAL HIGH (ref 26.0–34.0)
MCHC: 34.2 g/dL (ref 30.0–36.0)
MCV: 101.4 fL — ABNORMAL HIGH (ref 80.0–100.0)
Monocytes Absolute: 0.9 10*3/uL (ref 0.1–1.0)
Monocytes Relative: 15 %
Neutro Abs: 2.8 10*3/uL (ref 1.7–7.7)
Neutrophils Relative %: 45 %
Platelets: 94 10*3/uL — ABNORMAL LOW (ref 150–400)
RBC: 3.52 MIL/uL — ABNORMAL LOW (ref 4.22–5.81)
RDW: 14.7 % (ref 11.5–15.5)
WBC: 6.2 10*3/uL (ref 4.0–10.5)
nRBC: 0 % (ref 0.0–0.2)

## 2023-07-31 LAB — TROPONIN I (HIGH SENSITIVITY): Troponin I (High Sensitivity): 3 ng/L (ref <18)

## 2023-07-31 LAB — COMPREHENSIVE METABOLIC PANEL
ALT: 18 U/L (ref 0–44)
AST: 51 U/L — ABNORMAL HIGH (ref 15–41)
Albumin: 3.6 g/dL (ref 3.5–5.0)
Alkaline Phosphatase: 89 U/L (ref 38–126)
Anion gap: 22 — ABNORMAL HIGH (ref 5–15)
BUN: 5 mg/dL — ABNORMAL LOW (ref 8–23)
CO2: 19 mmol/L — ABNORMAL LOW (ref 22–32)
Calcium: 8.4 mg/dL — ABNORMAL LOW (ref 8.9–10.3)
Chloride: 98 mmol/L (ref 98–111)
Creatinine, Ser: 0.66 mg/dL (ref 0.61–1.24)
GFR, Estimated: >60 mL/min (ref >60)
Glucose, Bld: 131 mg/dL — ABNORMAL HIGH (ref 70–99)
Potassium: 3.3 mmol/L — ABNORMAL LOW (ref 3.5–5.1)
Sodium: 139 mmol/L (ref 135–145)
Total Bilirubin: 1.6 mg/dL — ABNORMAL HIGH (ref 0.0–1.2)
Total Protein: 7.5 g/dL (ref 6.5–8.1)

## 2023-07-31 LAB — ACETAMINOPHEN LEVEL: Acetaminophen (Tylenol), Serum: <10 ug/mL — ABNORMAL LOW (ref 10–30)

## 2023-07-31 LAB — SALICYLATE LEVEL: Salicylate Lvl: <7 mg/dL — ABNORMAL LOW (ref 7.0–30.0)

## 2023-07-31 LAB — LIPASE, BLOOD: Lipase: 52 U/L — ABNORMAL HIGH (ref 11–51)

## 2023-07-31 LAB — ETHANOL: Alcohol, Ethyl (B): 378 mg/dL (ref <10)

## 2023-07-31 MED ORDER — LORAZEPAM 2 MG/ML IJ SOLN
1.0000 mg | Freq: Once | INTRAMUSCULAR | Status: AC
  Administered 2023-07-31: 1 mg via INTRAVENOUS
  Filled 2023-07-31: qty 1

## 2023-07-31 MED ORDER — LACTATED RINGERS IV BOLUS
1000.0000 mL | Freq: Once | INTRAVENOUS | Status: AC
  Administered 2023-07-31: 1000 mL via INTRAVENOUS

## 2023-07-31 MED ORDER — LORAZEPAM 2 MG/ML IJ SOLN: 2.0000 mg | Freq: Once | INTRAMUSCULAR | Status: DC
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
## 2023-07-31 NOTE — ED Triage Notes (Signed)
+   ETOH,  Family found pt on floor in pool of blood , unsure how long pt has been on floor, per EMS pt a/o x 4 , just unable to recall events around the fall. EMS reports pt possibly fell from bed, fall was unwitnessed.   As RN is triage pt, pt does appear to be confused with that it happening , but EMS claims this is pt baseline.   Pt in C- collar, RN requested PA to MSE pt. Pt has been MSE'd

## 2023-08-01 ENCOUNTER — Encounter (HOSPITAL_COMMUNITY): Payer: Self-pay | Admitting: Internal Medicine

## 2023-08-01 ENCOUNTER — Observation Stay (HOSPITAL_COMMUNITY): Payer: BC Managed Care – PPO

## 2023-08-01 ENCOUNTER — Other Ambulatory Visit: Payer: Self-pay

## 2023-08-01 DIAGNOSIS — E872 Acidosis, unspecified: Secondary | ICD-10-CM | POA: Diagnosis present

## 2023-08-01 DIAGNOSIS — F17210 Nicotine dependence, cigarettes, uncomplicated: Secondary | ICD-10-CM | POA: Diagnosis present

## 2023-08-01 DIAGNOSIS — F10220 Alcohol dependence with intoxication, uncomplicated: Secondary | ICD-10-CM | POA: Diagnosis present

## 2023-08-01 DIAGNOSIS — D61818 Other pancytopenia: Secondary | ICD-10-CM | POA: Diagnosis present

## 2023-08-01 DIAGNOSIS — E876 Hypokalemia: Secondary | ICD-10-CM | POA: Diagnosis present

## 2023-08-01 DIAGNOSIS — E8729 Other acidosis: Secondary | ICD-10-CM | POA: Diagnosis present

## 2023-08-01 DIAGNOSIS — Y908 Blood alcohol level of 240 mg/100 ml or more: Secondary | ICD-10-CM | POA: Diagnosis present

## 2023-08-01 DIAGNOSIS — R000 Tachycardia, unspecified: Secondary | ICD-10-CM | POA: Diagnosis present

## 2023-08-01 DIAGNOSIS — Z79899 Other long term (current) drug therapy: Secondary | ICD-10-CM | POA: Diagnosis not present

## 2023-08-01 DIAGNOSIS — Z66 Do not resuscitate: Secondary | ICD-10-CM | POA: Diagnosis present

## 2023-08-01 DIAGNOSIS — M79601 Pain in right arm: Secondary | ICD-10-CM | POA: Diagnosis present

## 2023-08-01 DIAGNOSIS — W19XXXA Unspecified fall, initial encounter: Secondary | ICD-10-CM | POA: Diagnosis present

## 2023-08-01 DIAGNOSIS — Z83438 Family history of other disorder of lipoprotein metabolism and other lipidemia: Secondary | ICD-10-CM | POA: Diagnosis not present

## 2023-08-01 DIAGNOSIS — I6203 Nontraumatic chronic subdural hemorrhage: Secondary | ICD-10-CM | POA: Diagnosis present

## 2023-08-01 DIAGNOSIS — Z808 Family history of malignant neoplasm of other organs or systems: Secondary | ICD-10-CM | POA: Diagnosis not present

## 2023-08-01 DIAGNOSIS — E871 Hypo-osmolality and hyponatremia: Secondary | ICD-10-CM | POA: Diagnosis not present

## 2023-08-01 DIAGNOSIS — Y92009 Unspecified place in unspecified non-institutional (private) residence as the place of occurrence of the external cause: Secondary | ICD-10-CM | POA: Diagnosis not present

## 2023-08-01 DIAGNOSIS — Z681 Body mass index (BMI) 19 or less, adult: Secondary | ICD-10-CM | POA: Diagnosis not present

## 2023-08-01 DIAGNOSIS — F10939 Alcohol use, unspecified with withdrawal, unspecified: Secondary | ICD-10-CM | POA: Diagnosis not present

## 2023-08-01 DIAGNOSIS — F1027 Alcohol dependence with alcohol-induced persisting dementia: Secondary | ICD-10-CM | POA: Diagnosis present

## 2023-08-01 DIAGNOSIS — E43 Unspecified severe protein-calorie malnutrition: Secondary | ICD-10-CM | POA: Diagnosis present

## 2023-08-01 DIAGNOSIS — I10 Essential (primary) hypertension: Secondary | ICD-10-CM | POA: Diagnosis present

## 2023-08-01 DIAGNOSIS — K7460 Unspecified cirrhosis of liver: Secondary | ICD-10-CM | POA: Diagnosis present

## 2023-08-01 DIAGNOSIS — Z87442 Personal history of urinary calculi: Secondary | ICD-10-CM | POA: Diagnosis not present

## 2023-08-01 DIAGNOSIS — Z807 Family history of other malignant neoplasms of lymphoid, hematopoietic and related tissues: Secondary | ICD-10-CM | POA: Diagnosis not present

## 2023-08-01 DIAGNOSIS — Z8249 Family history of ischemic heart disease and other diseases of the circulatory system: Secondary | ICD-10-CM | POA: Diagnosis not present

## 2023-08-01 DIAGNOSIS — E860 Dehydration: Secondary | ICD-10-CM | POA: Diagnosis present

## 2023-08-01 LAB — LACTIC ACID, PLASMA
Lactic Acid, Venous: 1.8 mmol/L (ref 0.5–1.9)
Lactic Acid, Venous: 2.9 mmol/L (ref 0.5–1.9)
Lactic Acid, Venous: 8.1 mmol/L (ref 0.5–1.9)
Lactic Acid, Venous: 8.6 mmol/L (ref 0.5–1.9)
Lactic Acid, Venous: >9 mmol/L (ref 0.5–1.9)

## 2023-08-01 LAB — URINALYSIS, ROUTINE W REFLEX MICROSCOPIC
Bilirubin Urine: NEGATIVE
Glucose, UA: NEGATIVE mg/dL
Hgb urine dipstick: NEGATIVE
Ketones, ur: NEGATIVE mg/dL
Leukocytes,Ua: NEGATIVE
Nitrite: NEGATIVE
Protein, ur: NEGATIVE mg/dL
Specific Gravity, Urine: 1.01 (ref 1.005–1.030)
pH: 6 (ref 5.0–8.0)

## 2023-08-01 LAB — HEMOGLOBIN AND HEMATOCRIT, BLOOD
HCT: 27.9 % — ABNORMAL LOW (ref 39.0–52.0)
HCT: 30.4 % — ABNORMAL LOW (ref 39.0–52.0)
Hemoglobin: 9.3 g/dL — ABNORMAL LOW (ref 13.0–17.0)
Hemoglobin: 9.9 g/dL — ABNORMAL LOW (ref 13.0–17.0)

## 2023-08-01 LAB — RAPID URINE DRUG SCREEN, HOSP PERFORMED
Amphetamines: NOT DETECTED
Barbiturates: NOT DETECTED
Benzodiazepines: NOT DETECTED
Cocaine: NOT DETECTED
Opiates: NOT DETECTED
Tetrahydrocannabinol: NOT DETECTED

## 2023-08-01 LAB — AMMONIA: Ammonia: 33 umol/L (ref 9–35)

## 2023-08-01 LAB — PHOSPHORUS: Phosphorus: 3.2 mg/dL (ref 2.5–4.6)

## 2023-08-01 LAB — CK: Total CK: 87 U/L (ref 49–397)

## 2023-08-01 LAB — MAGNESIUM: Magnesium: 1.4 mg/dL — ABNORMAL LOW (ref 1.7–2.4)

## 2023-08-01 MED ORDER — IBUPROFEN 400 MG PO TABS
600.0000 mg | ORAL_TABLET | Freq: Four times a day (QID) | ORAL | Status: DC | PRN
  Administered 2023-08-02 – 2023-08-05 (×2): 600 mg via ORAL
  Filled 2023-08-01 (×2): qty 3

## 2023-08-01 MED ORDER — LACTULOSE 10 GM/15ML PO SOLN
20.0000 g | Freq: Two times a day (BID) | ORAL | Status: DC
  Administered 2023-08-01 – 2023-08-11 (×19): 20 g via ORAL
  Filled 2023-08-01 (×21): qty 30

## 2023-08-01 MED ORDER — LORAZEPAM 2 MG/ML IJ SOLN
1.0000 mg | INTRAMUSCULAR | Status: AC | PRN
  Administered 2023-08-01: 1 mg via INTRAVENOUS
  Administered 2023-08-01: 2 mg via INTRAVENOUS
  Administered 2023-08-01: 1 mg via INTRAVENOUS
  Filled 2023-08-01 (×3): qty 1

## 2023-08-01 MED ORDER — PANTOPRAZOLE SODIUM 40 MG IV SOLR
40.0000 mg | INTRAVENOUS | Status: DC
  Administered 2023-08-01 – 2023-08-03 (×3): 40 mg via INTRAVENOUS
  Filled 2023-08-01 (×3): qty 10

## 2023-08-01 MED ORDER — IBUPROFEN 200 MG PO TABS: 400.0000 mg | ORAL_TABLET | Freq: Four times a day (QID) | ORAL | Status: DC | PRN

## 2023-08-01 MED ORDER — METOCLOPRAMIDE HCL 5 MG/ML IJ SOLN
10.0000 mg | Freq: Four times a day (QID) | INTRAMUSCULAR | Status: DC | PRN
  Administered 2023-08-01: 10 mg via INTRAVENOUS
  Filled 2023-08-01: qty 2

## 2023-08-01 MED ORDER — THIAMINE HCL 100 MG/ML IJ SOLN
100.0000 mg | Freq: Every day | INTRAMUSCULAR | Status: DC
  Administered 2023-08-01 – 2023-08-02 (×2): 100 mg via INTRAVENOUS
  Filled 2023-08-01 (×3): qty 2

## 2023-08-01 MED ORDER — ENSURE ENLIVE PO LIQD
237.0000 mL | Freq: Three times a day (TID) | ORAL | Status: DC
  Administered 2023-08-01 – 2023-08-11 (×26): 237 mL via ORAL
  Filled 2023-08-01 (×3): qty 237

## 2023-08-01 MED ORDER — ADULT MULTIVITAMIN W/MINERALS CH
1.0000 | ORAL_TABLET | Freq: Every day | ORAL | Status: DC
  Administered 2023-08-01 – 2023-08-11 (×11): 1 via ORAL
  Filled 2023-08-01 (×11): qty 1

## 2023-08-01 MED ORDER — MAGNESIUM SULFATE 2 GM/50ML IV SOLN
2.0000 g | Freq: Once | INTRAVENOUS | Status: AC
  Administered 2023-08-01: 2 g via INTRAVENOUS
  Filled 2023-08-01: qty 50

## 2023-08-01 MED ORDER — TRAZODONE HCL 50 MG PO TABS
25.0000 mg | ORAL_TABLET | Freq: Every evening | ORAL | Status: DC | PRN
  Administered 2023-08-02 – 2023-08-06 (×3): 25 mg via ORAL
  Filled 2023-08-01 (×3): qty 1

## 2023-08-01 MED ORDER — SODIUM CHLORIDE 0.9 % IV BOLUS
1000.0000 mL | Freq: Once | INTRAVENOUS | Status: AC
  Administered 2023-08-01: 1000 mL via INTRAVENOUS

## 2023-08-01 MED ORDER — LORAZEPAM 1 MG PO TABS: 1.0000 mg | ORAL_TABLET | ORAL | Status: AC | PRN

## 2023-08-01 MED ORDER — FOLIC ACID 1 MG PO TABS
1.0000 mg | ORAL_TABLET | Freq: Every day | ORAL | Status: DC
  Administered 2023-08-01 – 2023-08-11 (×11): 1 mg via ORAL
  Filled 2023-08-01 (×12): qty 1

## 2023-08-01 MED ORDER — ALBUTEROL SULFATE (2.5 MG/3ML) 0.083% IN NEBU: 2.5000 mg | INHALATION_SOLUTION | RESPIRATORY_TRACT | Status: DC | PRN

## 2023-08-01 MED ORDER — ENOXAPARIN SODIUM 40 MG/0.4ML IJ SOSY
40.0000 mg | PREFILLED_SYRINGE | INTRAMUSCULAR | Status: DC
  Administered 2023-08-01: 40 mg via SUBCUTANEOUS
  Filled 2023-08-01: qty 0.4

## 2023-08-01 MED ORDER — FUROSEMIDE 20 MG PO TABS
20.0000 mg | ORAL_TABLET | Freq: Every day | ORAL | Status: DC
  Administered 2023-08-01 – 2023-08-03 (×3): 20 mg via ORAL
  Filled 2023-08-01 (×3): qty 1

## 2023-08-01 MED ORDER — THIAMINE MONONITRATE 100 MG PO TABS
100.0000 mg | ORAL_TABLET | Freq: Every day | ORAL | Status: DC
  Filled 2023-08-01: qty 1

## 2023-08-01 NOTE — ED Provider Notes (Signed)
I assumed care of this patient from previous provider.  Please see their note for further details of history, exam, and MDM.   Briefly patient is a 63 y.o. male who presented after a fall while intoxicated.  Labs without evidence of acute injury.  Currently awaiting rest of labs including CK, UA and lactic acid.  Patient is receiving LR for hydration.  Initial lactic acid greater than 9.  Thought to be contaminated from LR.  Repeats were elevated.  Patient does not have source of infection on exam.  No evidence of ischemic limbs.  Abdomen is benign.  He is awake and alert and conversing.  Possibly secondary elevated lactic acid due to lack of clearance versus thiamine deficiency.  Consulted Dr. Lazarus Salines from the hospitalist service who will coordinate admission by the morning team.  .Critical Care  Performed by: Nira Conn, MD Authorized by: Nira Conn, MD   Critical care provider statement:    Critical care time (minutes):  45   Critical care time was exclusive of:  Separately billable procedures and treating other patients   Critical care was necessary to treat or prevent imminent or life-threatening deterioration of the following conditions: lactic acid >4.   Critical care was time spent personally by me on the following activities:  Development of treatment plan with patient or surrogate, discussions with consultants, evaluation of patient's response to treatment, examination of patient, obtaining history from patient or surrogate, review of old charts, re-evaluation of patient's condition, pulse oximetry, ordering and review of radiographic studies, ordering and review of laboratory studies and ordering and performing treatments and interventions   I assumed direction of critical care for this patient from another provider in my specialty: yes     Care discussed with: admitting provider           Nira Conn, MD 08/01/23 [phone]

## 2023-08-01 NOTE — ED Notes (Signed)
ED TO INPATIENT HANDOFF REPORT  Name/Age/Gender Richard Andrews 63 y.o. male  Code Status    Code Status Orders  (From admission, onward)           Start     Ordered   08/01/23 0823  Do not attempt resuscitation (DNR)- Limited -Do Not Intubate (DNI)  Continuous       Question Answer Comment  If pulseless and not breathing No CPR or chest compressions.   In Pre-Arrest Conditions (Patient Is Breathing and Has A Pulse) Do not intubate. Provide all appropriate non-invasive medical interventions. Avoid ICU transfer unless indicated or required.   Consent: Discussion documented in EHR or advanced directives reviewed      08/01/23 0823           Code Status History     Date Active Date Inactive Code Status Order ID Comments User Context   07/08/2023 1559 07/15/2023 2159 Limited: Do not attempt resuscitation (DNR) -DNR-LIMITED -Do Not Intubate/DNI  [id number]  Alberteen Sam, MD Inpatient   07/05/2023 1725 07/08/2023 1559 Full Code [id number]  Alberteen Sam, MD ED   05/03/2023 0845 05/09/2023 1848 Full Code [id number]  Maryln Gottron, MD ED   01/24/2023 1327 02/06/2023 1907 DNR [id number]  Elgergawy, Leana Roe, MD Inpatient   01/23/2023 0038 01/24/2023 1327 Full Code [id number]  Andris Baumann, MD ED   11/21/2022 1727 11/29/2022 1530 Full Code [id number]  Charlton Amor, PA-C Inpatient   11/21/2022 1727 11/21/2022 1727 Full Code [id number]  Charlton Amor, PA-C Inpatient   11/15/2022 1828 11/21/2022 1726 Full Code [id number]  Diamantina Monks, MD Inpatient   10/07/2022 1657 10/22/2022 1834 Full Code [id number]  Milinda Antis, PA-C Inpatient   10/07/2022 1657 10/07/2022 1657 Full Code [id number]  Milinda Antis, PA-C Inpatient   09/24/2022 1808 10/07/2022 1559 Full Code [id number]  Gaynelle Adu, MD ED   12/11/2021 1632 12/12/2021 0343 Full Code [id number]  Vanetta Mulders, MD ED   01/19/2021 1705 01/25/2021 1929 Full Code [id number]  Drema Dallas, MD Inpatient   01/11/2020 0002 01/13/2020  1610 Full Code [id number]  Dominica Severin, MD Inpatient       Home/SNF/Other Home  Chief Complaint Lactic acidosis [E87.20]  Level of Care/Admitting Diagnosis ED Disposition     ED Disposition  Admit   Condition  --   Comment  Hospital Area: [facility] [[id number]]  Level of Care: Progressive [102]  Admit to Progressive based on following criteria: MULTISYSTEM THREATS such as stable sepsis, metabolic/electrolyte imbalance with or without encephalopathy that is responding to early treatment.  May admit patient to [name] or [facility] if equivalent level of care is available:: Yes  Covid Evaluation: Asymptomatic - no recent exposure (last 10 days) testing not required  Diagnosis: Lactic acidosis [616073]  Admitting Physician: Maryln Gottron [[id number]]  Attending Physician: Kirby Crigler, Parks Neptune [[id number]]  Certification:: I certify this patient will need inpatient services for at least 2 midnights  Expected Medical Readiness: 08/03/2023          Medical History Past Medical History:  Diagnosis Date   Allergy    Arthritis    Distal radius fracture, left    Hepatic steatosis    Hypertension    Kidney stone 02/16/2023   Substance abuse (HCC)     Allergies No Known Allergies  IV Location/Drains/Wounds Patient Lines/Drains/Airways Status     Active Line/Drains/Airways     Name Placement date Placement time Site Days  Peripheral IV 07/31/23 22 G Right Hand 07/31/23  --  Hand  1   Peripheral IV 08/01/23 20 G Left Antecubital 08/01/23  0100  Antecubital  less than 1   Ureteral Drain/Stent Right ureter 6 Fr. 03/21/23  1000  Right ureter  133   Ureteral Drain/Stent Left ureter 6 Fr. 03/21/23  0958  Left ureter  133   Wound / Incision (Open or Dehisced) 11/15/22 Other (Comment) Arm Left;Lower;Posterior Abrasion 11/15/22  1900  Arm  259   Wound / Incision (Open or Dehisced) 01/23/23 Irritant Dermatitis (Moisture Associated Skin Damage) Buttocks  Posterior;Bilateral excoriation 01/23/23  1630  Buttocks  190   Wound / Incision (Open or Dehisced) 01/23/23 Irritant Dermatitis (Moisture Associated Skin Damage) Scrotum excoriation 01/23/23  1630  Scrotum  190   Wound / Incision (Open or Dehisced) 01/23/23 Non-pressure wound Thigh Anterior;Left black scabbed over spot, may be healing wound or a large mole 01/23/23  1630  Thigh  190   Wound / Incision (Open or Dehisced) 07/06/23 Skin tear Buttocks Right;Lower 07/06/23  0339  Buttocks  26   Wound / Incision (Open or Dehisced) 07/06/23 Skin tear Buttocks Left;Lower 07/06/23  0341  Buttocks  26            Labs/Imaging Results for orders placed or performed during the hospital encounter of 07/31/23 (from the past 48 hours)  CBC with Differential     Status: Abnormal   Collection Time: 07/31/23  9:23 PM  Result Value Ref Range   WBC 6.2 4.0 - 10.5 K/uL   RBC 3.52 (L) 4.22 - 5.81 MIL/uL   Hemoglobin 12.2 (L) 13.0 - 17.0 g/dL   HCT 16.1 (L) 09.6 - 04.5 %   MCV 101.4 (H) 80.0 - 100.0 fL   MCH 34.7 (H) 26.0 - 34.0 pg   MCHC 34.2 30.0 - 36.0 g/dL   RDW 40.9 81.1 - 91.4 %   Platelets 94 (L) 150 - 400 K/uL    Comment: SPECIMEN CHECKED FOR CLOTS Immature Platelet Fraction may be clinically indicated, consider ordering this additional test [id number] REPEATED TO VERIFY    nRBC 0.0 0.0 - 0.2 %   Neutrophils Relative % 45 %   Neutro Abs 2.8 1.7 - 7.7 K/uL   Lymphocytes Relative 36 %   Lymphs Abs 2.2 0.7 - 4.0 K/uL   Monocytes Relative 15 %   Monocytes Absolute 0.9 0.1 - 1.0 K/uL   Eosinophils Relative 2 %   Eosinophils Absolute 0.1 0.0 - 0.5 K/uL   Basophils Relative 1 %   Basophils Absolute 0.1 0.0 - 0.1 K/uL   Immature Granulocytes 1 %   Abs Immature Granulocytes 0.04 0.00 - 0.07 K/uL    Comment: Performed at [facility], [address], [place], [name] [address]  Comprehensive metabolic panel     Status: Abnormal   Collection Time: 07/31/23  9:23 PM  Result  Value Ref Range   Sodium 139 135 - 145 mmol/L    Comment: ELECTROLYTES REPEATED TO VERIFY   Potassium 3.3 (L) 3.5 - 5.1 mmol/L    Comment: ELECTROLYTES REPEATED TO VERIFY   Chloride 98 98 - 111 mmol/L    Comment: ELECTROLYTES REPEATED TO VERIFY   CO2 19 (L) 22 - 32 mmol/L    Comment: ELECTROLYTES REPEATED TO VERIFY   Glucose, Bld 131 (H) 70 - 99 mg/dL    Comment: Glucose reference range applies only to samples taken after fasting for at least 8 hours.  BUN 5 (L) 8 - 23 mg/dL   Creatinine, Ser 0.98 0.61 - 1.24 mg/dL   Calcium 8.4 (L) 8.9 - 10.3 mg/dL    Comment: ELECTROLYTES REPEATED TO VERIFY   Total Protein 7.5 6.5 - 8.1 g/dL   Albumin 3.6 3.5 - 5.0 g/dL   AST 51 (H) 15 - 41 U/L   ALT 18 0 - 44 U/L   Alkaline Phosphatase 89 38 - 126 U/L   Total Bilirubin 1.6 (H) 0.0 - 1.2 mg/dL   GFR, Estimated >11 >91 mL/min    Comment: (NOTE) Calculated using the CKD-EPI Creatinine Equation (2021)    Anion gap 22 (H) 5 - 15    Comment: Performed at [facility], [address] [place], [name] [address]  Lipase, blood     Status: Abnormal   Collection Time: 07/31/23  9:23 PM  Result Value Ref Range   Lipase 52 (H) 11 - 51 U/L    Comment: Performed at [facility], [address] [place], [name] [address]  Troponin I (High Sensitivity)     Status: None   Collection Time: 07/31/23  9:23 PM  Result Value Ref Range   Troponin I (High Sensitivity) 3 <18 ng/L    Comment: (NOTE) Elevated high sensitivity troponin I (hsTnI) values and significant  changes across serial measurements may suggest ACS but many other  chronic and acute conditions are known to elevate hsTnI results.  Refer to the "Links" section for chest pain algorithms and additional  guidance. Performed at [facility], [address] [place], [name] [address]   Ethanol     Status: Abnormal   Collection Time: 07/31/23  9:23 PM  Result Value Ref Range   Alcohol,  Ethyl (B) 378 (HH) <10 mg/dL    Comment: CRITICAL RESULT CALLED TO, READ BACK BY AND VERIFIED WITH gray, c. rn at 2219 on 2.17.25. fa (NOTE) Lowest detectable limit for serum alcohol is 10 mg/dL.  For medical purposes only. Performed at [facility], [address] [place], [name] [address]   Acetaminophen level     Status: Abnormal   Collection Time: 07/31/23  9:23 PM  Result Value Ref Range   Acetaminophen (Tylenol), Serum <10 (L) 10 - 30 ug/mL    Comment: (NOTE) Therapeutic concentrations vary significantly. A range of 10-30 ug/mL  may be an effective concentration for many patients. However, some  are best treated at concentrations outside of this range. Acetaminophen concentrations >150 ug/mL at 4 hours after ingestion  and >50 ug/mL at 12 hours after ingestion are often associated with  toxic reactions.  Performed at [facility], [address], [place], [name] [address]   Salicylate level     Status: Abnormal   Collection Time: 07/31/23  9:23 PM  Result Value Ref Range   Salicylate Lvl <7.0 (L) 7.0 - 30.0 mg/dL    Comment: Performed at [facility], [address], [place], [name] [address]  CK     Status: None   Collection Time: 07/31/23  9:23 PM  Result Value Ref Range   Total CK 87 49 - 397 U/L    Comment: Performed at [facility], [address], [place], [name] [address]  Magnesium     Status: Abnormal   Collection Time: 07/31/23  9:23 PM  Result Value Ref Range   Magnesium 1.4 (L) 1.7 - 2.4 mg/dL    Comment: Performed at [facility], [address] [place], [name] [address]  Phosphorus     Status: None   Collection Time: 07/31/23  9:23 PM  Result Value Ref Range   Phosphorus 3.2 2.5 - 4.6 mg/dL    Comment: Performed at [facility] [address], [place], [name] [address]  Resp panel by RT-PCR (RSV, Flu A&B, Covid) Anterior Nasal Swab      Status: None   Collection Time: 07/31/23  9:32 PM   Specimen: Anterior Nasal Swab  Result Value Ref Range   SARS Coronavirus 2 by RT PCR NEGATIVE NEGATIVE    Comment: (NOTE) SARS-CoV-2 target nucleic acids are NOT DETECTED.  The SARS-CoV-2 RNA is generally detectable in upper respiratory specimens during the acute phase of infection. The lowest concentration of SARS-CoV-2 viral copies this assay can detect is 138 copies/mL. A negative result does not preclude SARS-Cov-2 infection and should not be used as the sole basis for treatment or other patient management decisions. A negative result may occur with  improper specimen collection/handling, submission of specimen other than nasopharyngeal swab, presence of viral mutation(s) within the areas targeted by this assay, and inadequate number of viral copies(<138 copies/mL). A negative result must be combined with clinical observations, patient history, and epidemiological information. The expected result is Negative.  Fact Sheet for Patients:  BloggerCourse.com  Fact Sheet for Healthcare Providers:  SeriousBroker.it  This test is no t yet approved or cleared by the [place] FDA and  has been authorized for detection and/or diagnosis of SARS-CoV-2 by FDA under an Emergency Use Authorization (EUA). This EUA will remain  in effect (meaning this test can be used) for the duration of the COVID-19 declaration under Section 564(b)(1) of the Act, 21 U.S.C.section 360bbb-3(b)(1), unless the authorization is terminated  or revoked sooner.       Influenza A by PCR NEGATIVE NEGATIVE   Influenza B by PCR NEGATIVE NEGATIVE    Comment: (NOTE) The Xpert Xpress SARS-CoV-2/FLU/RSV plus assay is intended as an aid in the diagnosis of influenza from Nasopharyngeal swab specimens and should not be used as a sole basis for treatment. Nasal washings and aspirates are unacceptable for Xpert  Xpress SARS-CoV-2/FLU/RSV testing.  Fact Sheet for Patients: BloggerCourse.com  Fact Sheet for Healthcare Providers: SeriousBroker.it  This test is not yet approved or cleared by the [place] FDA and has been authorized for detection and/or diagnosis of SARS-CoV-2 by FDA under an Emergency Use Authorization (EUA). This EUA will remain in effect (meaning this test can be used) for the duration of the COVID-19 declaration under Section 564(b)(1) of the Act, 21 U.S.C. section 360bbb-3(b)(1), unless the authorization is terminated or revoked.     Resp Syncytial Virus by PCR NEGATIVE NEGATIVE    Comment: (NOTE) Fact Sheet for Patients: BloggerCourse.com  Fact Sheet for Healthcare Providers: SeriousBroker.it  This test is not yet approved or cleared by the [place] FDA and has been authorized for detection and/or diagnosis of SARS-CoV-2 by FDA under an Emergency Use Authorization (EUA). This EUA will remain in effect (meaning this test can be used) for the duration of the COVID-19 declaration under Section 564(b)(1) of the Act, 21 U.S.C. section 360bbb-3(b)(1), unless the authorization is terminated or revoked.  Performed at [facility], [address], [place], [name] [address]   Lactic acid, plasma     Status: Abnormal   Collection Time: 07/31/23 11:53 PM  Result Value Ref Range   Lactic Acid, Venous >9.0 (  HH) 0.5 - 1.9 mmol/L    Comment: CRITICAL RESULT CALLED TO, READ BACK BY AND VERIFIED WITH  GRAY, C. RN AT [phone] ON 1.18.25. FA Performed at [facility], [address] [place], [name] [address]   Ammonia     Status: None   Collection Time: 08/01/23 12:40 AM  Result Value Ref Range   Ammonia 33 9 - 35 umol/L    Comment: Performed at [facility], [address] [place], [name] [address]  Lactic acid,  plasma     Status: Abnormal   Collection Time: 08/01/23 12:52 AM  Result Value Ref Range   Lactic Acid, Venous 8.6 (HH) 0.5 - 1.9 mmol/L    Comment: CRITICAL VALUE NOTED. VALUE IS CONSISTENT WITH PREVIOUSLY REPORTED/CALLED VALUE Performed at [facility], [address] [place], [name] [address]   Lactic acid, plasma     Status: Abnormal   Collection Time: 08/01/23  2:23 AM  Result Value Ref Range   Lactic Acid, Venous 8.1 (HH) 0.5 - 1.9 mmol/L    Comment: CRITICAL VALUE NOTED. VALUE IS CONSISTENT WITH PREVIOUSLY REPORTED/CALLED VALUE Performed at [facility], [address], [place], [name] [address]   Rapid urine drug screen (hospital performed)     Status: None   Collection Time: 08/01/23  2:35 AM  Result Value Ref Range   Opiates NONE DETECTED NONE DETECTED   Cocaine NONE DETECTED NONE DETECTED   Benzodiazepines NONE DETECTED NONE DETECTED   Amphetamines NONE DETECTED NONE DETECTED   Tetrahydrocannabinol NONE DETECTED NONE DETECTED   Barbiturates NONE DETECTED NONE DETECTED    Comment: (NOTE) DRUG SCREEN FOR MEDICAL PURPOSES ONLY.  IF CONFIRMATION IS NEEDED FOR ANY PURPOSE, NOTIFY LAB WITHIN 5 DAYS.  LOWEST DETECTABLE LIMITS FOR URINE DRUG SCREEN Drug Class                     Cutoff (ng/mL) Amphetamine and metabolites    1000 Barbiturate and metabolites    200 Benzodiazepine                 200 Opiates and metabolites        300 Cocaine and metabolites        300 THC                            50 Performed at [facility], [address] [place], [name] [address]   Urinalysis, Routine w reflex microscopic -Urine, Clean Catch     Status: None   Collection Time: 08/01/23  2:35 AM  Result Value Ref Range   Color, Urine YELLOW YELLOW   APPearance CLEAR CLEAR   Specific Gravity, Urine 1.010 1.005 - 1.030   pH 6.0 5.0 - 8.0   Glucose, UA NEGATIVE NEGATIVE mg/dL   Hgb urine dipstick NEGATIVE NEGATIVE    Bilirubin Urine NEGATIVE NEGATIVE   Ketones, ur NEGATIVE NEGATIVE mg/dL   Protein, ur NEGATIVE NEGATIVE mg/dL   Nitrite NEGATIVE NEGATIVE   Leukocytes,Ua NEGATIVE NEGATIVE    Comment: Performed at [facility], [address], [place], [name] [address]  Hemoglobin and hematocrit, blood     Status: Abnormal   Collection Time: 08/01/23 10:21 AM  Result Value Ref Range   Hemoglobin 9.3 (L) 13.0 - 17.0 g/dL   HCT 66.4 (L) 40.3 - 47.4 %    Comment: Performed at [facility],  [address], [place], [name] [address]  Lactic acid, plasma     Status: Abnormal   Collection Time: 08/01/23  4:30 PM  Result Value Ref Range   Lactic Acid, Venous 2.9 (HH) 0.5 - 1.9 mmol/L    Comment: CRITICAL VALUE NOTED. VALUE IS CONSISTENT WITH PREVIOUSLY REPORTED/CALLED VALUE Performed at [facility], [address], [place], [name] [address]    CT SHOULDER RIGHT WO CONTRAST Result Date: 08/01/2023 CLINICAL DATA:  Shoulder pain. Stress fracture suspected. Larey Seat and was found on floor. Prior ORIF of right proximal humeral fracture. EXAM: CT OF THE UPPER RIGHT EXTREMITY WITHOUT CONTRAST TECHNIQUE: Multidetector CT imaging of the upper right extremity was performed according to the standard protocol. RADIATION DOSE REDUCTION: This exam was performed according to the departmental dose-optimization program which includes automated exposure control, adjustment of the mA and/or kV according to patient size and/or use of iterative reconstruction technique. COMPARISON:  Right shoulder radiographs 07/07/2023, 05/03/2023, 11/15/2022, 09/24/2022 FINDINGS: Bones/Joint/Cartilage Redemonstration of proximal humeral lateral plate and screw fixation of the previously seen comminuted right humeral surgical neck and greater tuberosity fracture that was acute on 09/24/2022 radiographs. One of the screws extending through the inferior medial humeral neck is approximately 3 mm proud  (axial series 2, image 43, coronal series 7, image 63) to the medial humeral head articular surface. Minimal 1 mm extension of the most anterior superior screw through the anterior superior humeral head articular surface (sagittal series 8, image 90 and axial series 2, image 36). There appears to be partial fusion and partial far anterior lucency (coronal series 7, image 58) of the prior now remote proximal right humeral surgical neck fracture. This appears to be the baseline how this fracture healed. There is mild irregularity within the superior aspect of the greater tuberosity as seen on recent radiographs, which appears to be from the old fracture. No definite acute fracture within the visualized proximal right humerus. Severe inferior glenohumeral joint space narrowing and bone-on-bone contact. Large inferior humeral head-neck junction degenerative osteophytosis. Subacute to chronic posterolateral right fourth and posterior right sixth and seventh rib fractures, with callus formation and some areas of persistent fracture line lucency. Ligaments Suboptimally assessed by CT. Muscles and Tendons No rotator cuff muscle atrophy. Soft tissues Mild posterior dependent subpleural likely subsegmental atelectasis within the partially visualized right upper lung. IMPRESSION: 1. Redemonstration of proximal humeral lateral plate and screw fixation of the previously seen comminuted right humeral surgical neck and greater tuberosity fracture that was acute on 09/24/2022 radiographs. There appears to be partial fusion and partial far anterior lucency of the prior now remote proximal right humeral surgical neck fracture. This appears to be the baseline how this fracture healed, and no definite new acute fracture is seen. 2. Severe inferior glenohumeral joint space narrowing and bone-on-bone contact. 3. Subacute to chronic posterolateral right fourth and posterior right sixth and seventh rib fractures, with callus formation and  some areas of persistent fracture line lucency. Electronically Signed   By: Neita Garnet M.D.   On: 08/01/2023 11:54   CT Cervical Spine Wo Contrast Result Date: 07/31/2023 CLINICAL DATA:  Found on the floor.  Neck pain. EXAM: CT CERVICAL SPINE WITHOUT CONTRAST TECHNIQUE: Multidetector CT imaging of the cervical spine was performed without intravenous contrast. Multiplanar CT image reconstructions were also generated. RADIATION DOSE REDUCTION: This exam was performed according to the departmental dose-optimization program which includes automated exposure control, adjustment of the mA and/or kV according to patient size and/or use  of iterative reconstruction technique. COMPARISON:  01/22/2023 FINDINGS: Alignment: No malalignment. Skull base and vertebrae: No regional fracture. Soft tissues and spinal canal: No significant soft tissue finding. Disc levels: Ordinary degenerative spondylosis C3-4, C4-5, C5-6 and C6-7. Facet osteoarthritis on the left at C2-3 and C3-4. Some bony foraminal narrowing but no compressive canal stenosis. Upper chest: Scarring at the lung apices. Other: None IMPRESSION: No acute or traumatic finding. Ordinary degenerative spondylosis and facet osteoarthritis as outlined above. Electronically Signed   By: Paulina Fusi M.D.   On: 07/31/2023 20:36   DG Shoulder Right Result Date: 07/31/2023 CLINICAL DATA:  Larey Seat.  Found on the floor. EXAM: RIGHT SHOULDER - 2+ VIEW COMPARISON:  07/07/2023 FINDINGS: Previous ORIF of right proximal humeral fracture. No definite acute fracture. Cannot completely rule out a avulsion fracture at the rotator cuff insertion, but I think it is more likely that the appearance is due to the chronic healed fracture. IMPRESSION: Previous ORIF of right proximal humeral fracture. No definite acute fracture. Cannot completely exclude a avulsion fracture at the rotator cuff insertion, but I think it is more likely that the appearance is due to the chronic healed fracture.  Is the patient point tender at the greater tuberosity? Electronically Signed   By: Paulina Fusi M.D.   On: 07/31/2023 20:35   DG Chest Portable 1 View Result Date: 07/31/2023 CLINICAL DATA:  Found on the floor.  Intoxicated. EXAM: PORTABLE CHEST 1 VIEW COMPARISON:  07/07/2023 FINDINGS: Heart size is normal. Mediastinal shadows are normal. Chronic calcified granuloma in the medial right lung base. The lungs are otherwise clear. Old healed rib fractures. No acute regional finding. IMPRESSION: No active disease. Old healed rib fractures. Electronically Signed   By: Paulina Fusi M.D.   On: 07/31/2023 20:32   CT Head Wo Contrast Result Date: 07/31/2023 CLINICAL DATA:  Head trauma.  Abnormal mental status. EXAM: CT HEAD WITHOUT CONTRAST TECHNIQUE: Contiguous axial images were obtained from the base of the skull through the vertex without intravenous contrast. RADIATION DOSE REDUCTION: This exam was performed according to the departmental dose-optimization program which includes automated exposure control, adjustment of the mA and/or kV according to patient size and/or use of iterative reconstruction technique. COMPARISON:  07/05/2023 FINDINGS: Brain: Generalized brain atrophy. Mild chronic small-vessel ischemic change of the hemispheric white matter. No sign of acute infarction. 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 No suspicion of acute component. No mass effect. 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 Vascular: No other vascular finding. Skull: Negative Sinuses/Orbits: Small amount of fluid in the left maxillary sinus. Orbits negative. Other: None IMPRESSION: No acute or traumatic finding. Atrophy and chronic small-vessel ischemic changes of the white matter. Chronic arachnoid cyst at the anterior middle cranial fossa on the left. Chronic  right-sided subdural along the lateral convexity with maximal thickness of 4 mm. No suspicion of acute component. No mass effect. 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 Electronically Signed   By: Paulina Fusi M.D.   On: 07/31/2023 20:31    Pending Labs Unresulted Labs (From admission, onward)     Start     Ordered   08/02/23 0500  Comprehensive metabolic panel  Tomorrow morning,   R        08/01/23 0823   08/02/23 0500  CBC  Tomorrow morning,   R  08/01/23 0823   08/01/23 1549  Lactic acid, plasma  (Lactic Acid)  STAT Now then every 3 hours,   R (with STAT occurrences)      08/01/23 1548   08/01/23 0910  Hemoglobin and hematocrit, blood  Now then every 8 hours,   R (with TIMED occurrences)      08/01/23 0909   08/01/23 0834  Vitamin B1  Add-on,   AD        08/01/23 0834            Vitals/Pain Today's Vitals   08/01/23 1345 08/01/23 1700 08/01/23 1711 08/01/23 1900  BP: (!) 152/94 (!) 130/95  123/86  Pulse: (!) 116 (!) 109  (!) 101  Resp: 16 16  15   Temp:   99.3 F (37.4 C)   TempSrc:   Oral   SpO2: 98% 97%  99%    Isolation Precautions No active isolations  Medications Medications  LORazepam (ATIVAN) tablet 1-4 mg ( Oral See Alternative 08/01/23 1405)    Or  LORazepam (ATIVAN) injection 1-4 mg (1 mg Intravenous Given 08/01/23 1405)  thiamine (VITAMIN B1) injection 100 mg (100 mg Intravenous Given 08/01/23 0351)  folic acid (FOLVITE) tablet 1 mg (1 mg Oral Given 08/01/23 1024)  multivitamin with minerals tablet 1 tablet (1 tablet Oral Given 08/01/23 1024)  thiamine (VITAMIN B1) tablet 100 mg (has no administration in time range)  furosemide (LASIX) tablet 20 mg (20 mg Oral Given 08/01/23 1024)  lactulose (CHRONULAC) 10 GM/15ML solution 20 g (20 g Oral Given 08/01/23 1024)  feeding supplement (ENSURE ENLIVE / ENSURE PLUS) liquid 237 mL (237 mLs Oral Given 08/01/23 1635)  traZODone (DESYREL) tablet 25 mg (has no administration in  time range)  albuterol (PROVENTIL) (2.5 MG/3ML) 0.083% nebulizer solution 2.5 mg (has no administration in time range)  ibuprofen (ADVIL) tablet 600 mg (has no administration in time range)  metoCLOPramide (REGLAN) injection 10 mg (10 mg Intravenous Given 08/01/23 0915)  pantoprazole (PROTONIX) injection 40 mg (40 mg Intravenous Given 08/01/23 1012)  lactated ringers bolus 1,000 mL (0 mLs Intravenous Stopped 07/31/23 2339)  LORazepam (ATIVAN) injection 1 mg (1 mg Intravenous Given 07/31/23 2215)  lactated ringers bolus 1,000 mL (0 mLs Intravenous Stopped 08/01/23 0227)  sodium chloride 0.9 % bolus 1,000 mL (0 mLs Intravenous Stopped 08/01/23 0512)  magnesium sulfate IVPB 2 g 50 mL (0 g Intravenous Stopped 08/01/23 1009)    Mobility walks with person assist

## 2023-08-01 NOTE — H&P (Addendum)
History and Physical  Richard Andrews YQM:[id number] DOB: December 26, 1960 DOA: 07/31/2023  PCP: [name], NP   Chief Complaint: Fall at home  HPI: Richard Andrews is a 63 y.o. male with medical history significant for severe alcohol use disorder, history of complicated withdrawals, acute traumatic subdural hematoma, presumed liver cirrhosis with multiple recent hospital admissions who is now being admitted to the hospital with lactic acidosis after an unwitnessed fall.  Spoke with the patient's wife with whom he is separated, but who checks on him frequently.  She states that he is essentially been in his usual chronically ill health, no new concerns.  Yesterday he was checking on him, he seemed to be fine, but while she was outside he fell inside the house.  He was never more confused, was conscious the whole time.  She does not think he hit his head or lost consciousness, though she was technically not present when he fell.  They called EMS because they were having trouble getting him up off the ground, and his right shoulder abrasion was bleeding.  She denies any recent illness, no recent vomiting, nausea, fevers, chills, complaints of pain or diarrhea.  Here in the emergency department, he has been tachycardic, lab work is significant for severely elevated lactic acid level.  However there are no signs of acute infection, abdomen is nonacute, no signs or symptoms of acute infection.  Currently he is resting comfortably, he denies to me any pain other than in his right shoulder, any vomiting, fevers, abdominal pain, or other complaints.  Review of Systems: Please see HPI for pertinent positives and negatives. A complete 10 system review of systems are otherwise negative.  Past Medical History:  Diagnosis Date   Allergy    Arthritis    Distal radius fracture, left    Hepatic steatosis    Hypertension    Kidney stone 02/16/2023   Substance abuse [facility])    Past Surgical History:  Procedure Laterality  Date   BIOPSY  01/22/2021   Procedure: BIOPSY;  Surgeon: Shellia Cleverly, DO;  Location: [facility];  Service: Gastroenterology;;   CYSTOSCOPY W/ URETERAL STENT PLACEMENT Bilateral 01/23/2023   Procedure: 1. Cystoscopy 2. Bilateral  retrograde pyelogram with interpretation 3. bilateral ureteral stent placement (6x24 cm on the right, 6x26 cm on the left) 4. Fluoroscopy <1 hour with intraoperative interpretation;  Surgeon: Despina Arias, MD;  Location: [facility];  Service: Urology;  Laterality: Bilateral;   CYSTOSCOPY/URETEROSCOPY/HOLMIUM LASER/STENT PLACEMENT Bilateral 03/21/2023   Procedure: BILATERAL URETEROSCOPY/HOLMIUM LASER/STENT PLACEMENT;  Surgeon: Despina Arias, MD;  Location: [facility] ORS;  Service: Urology;  Laterality: Bilateral;   ESOPHAGOGASTRODUODENOSCOPY (EGD) WITH PROPOFOL N/A 01/22/2021   Procedure: ESOPHAGOGASTRODUODENOSCOPY (EGD) WITH PROPOFOL;  Surgeon: Shellia Cleverly, DO;  Location: [facility];  Service: Gastroenterology;  Laterality: N/A;   I & D EXTREMITY Right 01/10/2020   Procedure: ORIF RIGHT WRIST;  Surgeon: Dominica Severin, MD;  Location: [facility] OR;  Service: Orthopedics;  Laterality: Right;   IR ANGIO EXTERNAL CAROTID SEL EXT CAROTID UNI R MOD SED  10/04/2022   IR ANGIO INTRA EXTRACRAN SEL INTERNAL CAROTID UNI R MOD SED  09/30/2022   IR ANGIOGRAM FOLLOW UP STUDY  09/30/2022   IR NEURO EACH ADD'L AFTER BASIC UNI RIGHT (MS)  10/04/2022   IR TRANSCATH/EMBOLIZ  09/30/2022   OPEN REDUCTION INTERNAL FIXATION (ORIF) DISTAL RADIAL FRACTURE Left 01/28/2015   Procedure: OPEN REDUCTION INTERNAL FIXATION (ORIF) LEFT DISTAL RADIAL FRACTURE;  Surgeon: Tarry Kos, MD;  Location: [facility];  Service: Orthopedics;  Laterality: Left;   ORIF HUMERUS FRACTURE Right 09/27/2022   Procedure: OPEN REDUCTION INTERNAL FIXATION (ORIF) PROXIMAL HUMERUS FRACTURE;  Surgeon: Teryl Lucy, MD;  Location: [facility] OR;  Service: Orthopedics;  Laterality: Right;   RADIOLOGY WITH ANESTHESIA N/A  09/30/2022   Procedure: IR WITH ANESTHESIA MMA EMBOLIZATION;  Surgeon: Radiologist, Medication, MD;  Location: [facility] OR;  Service: Radiology;  Laterality: N/A;   TONSILLECTOMY     Social History:  reports that he has been smoking cigarettes. He has a 10 pack-year smoking history. His smokeless tobacco use includes chew. He reports current alcohol use of about 16.0 - 20.0 standard drinks of alcohol per week. He reports that he does not use drugs.  No Known Allergies  Family History  Problem Relation Age of Onset   Heart disease Mother    Hyperlipidemia Mother    Skin cancer Mother    Cancer Father    Multiple myeloma Sister    Throat cancer Sister      Prior to Admission medications   Medication Sig Start Date End Date Taking? Authorizing Provider  feeding supplement (ENSURE ENLIVE / ENSURE PLUS) LIQD Take 237 mLs by mouth 3 (three) times daily between meals. 07/15/23   Pahwani, Kasandra Knudsen, MD  folic acid (FOLVITE) 1 MG tablet Take 1 tablet (1 mg total) by mouth daily. 07/16/23   Pahwani, Kasandra Knudsen, MD  furosemide (LASIX) 20 MG tablet Take 1 tablet (20 mg total) by mouth daily. 07/16/23   Pahwani, Kasandra Knudsen, MD  lactulose (CHRONULAC) 10 GM/15ML solution Take 30 mLs (20 g total) by mouth 2 (two) times daily. 07/15/23   Pahwani, Kasandra Knudsen, MD  magnesium oxide (MAG-OX) 400 (240 Mg) MG tablet Take 1 tablet (400 mg total) by mouth daily. 07/16/23   Pahwani, Kasandra Knudsen, MD  Multiple Vitamin (MULTIVITAMIN WITH MINERALS) TABS tablet Take 1 tablet by mouth daily. 07/16/23   Pahwani, Kasandra Knudsen, MD  thiamine (VITAMIN B-1) 100 MG tablet Take 1 tablet (100 mg total) by mouth daily. 07/16/23   Pahwani, Kasandra Knudsen, MD    Physical Exam: BP (!) 151/108   Pulse (!) 128   Temp 99.3 F (37.4 C) (Oral)   Resp 18   SpO2 100%  General: Thin chronically ill-appearing disheveled gentleman appearing his stated age.  Awake alert oriented x 4.  Pleasant and cooperative. Cardiovascular: Tachycardic and regular, no murmurs or rubs, no  peripheral edema  Respiratory: clear to auscultation bilaterally, no wheezes, no crackles  Abdomen: soft, nontender, nondistended, normal bowel tones heard  Skin: dry, no rashes, but with multiple skin excoriations and bruising on his body, in various stages of healing. Musculoskeletal: no joint effusions, normal range of motion, right shoulder is sore with a large overlying hemostatic wound Psychiatric: appropriate affect, normal speech  Neurologic: extraocular muscles intact, clear speech, moving all extremities with intact sensorium         Labs on Admission:  Basic Metabolic Panel: Recent Labs  Lab 07/31/23 2123  NA 139  K 3.3*  CL 98  CO2 19*  GLUCOSE 131*  BUN 5*  CREATININE 0.66  CALCIUM 8.4*  MG 1.4*  PHOS 3.2   Liver Function Tests: Recent Labs  Lab 07/31/23 2123  AST 51*  ALT 18  ALKPHOS 89  BILITOT 1.6*  PROT 7.5  ALBUMIN 3.6   Recent Labs  Lab 07/31/23 2123  LIPASE 52*   Recent Labs  Lab 08/01/23 0040  AMMONIA 33  CBC: Recent Labs  Lab 07/31/23 2123  WBC 6.2  NEUTROABS 2.8  HGB 12.2*  HCT 35.7*  MCV 101.4*  PLT 94*   Cardiac Enzymes: Recent Labs  Lab 07/31/23 2123  CKTOTAL 87   BNP (last 3 results) Recent Labs    05/05/23 0338 05/06/23 0345 05/07/23 0359  BNP 67.8 23.7 10.2    ProBNP (last 3 results) No results for input(s): "PROBNP" in the last 8760 hours.  CBG: No results for input(s): "GLUCAP" in the last 168 hours.  Radiological Exams on Admission: CT Cervical Spine Wo Contrast Result Date: 07/31/2023 CLINICAL DATA:  Found on the floor.  Neck pain. EXAM: CT CERVICAL SPINE WITHOUT CONTRAST TECHNIQUE: Multidetector CT imaging of the cervical spine was performed without intravenous contrast. Multiplanar CT image reconstructions were also generated. RADIATION DOSE REDUCTION: This exam was performed according to the departmental dose-optimization program which includes automated exposure control, adjustment of the mA and/or  kV according to patient size and/or use of iterative reconstruction technique. COMPARISON:  01/22/2023 FINDINGS: Alignment: No malalignment. Skull base and vertebrae: No regional fracture. Soft tissues and spinal canal: No significant soft tissue finding. Disc levels: Ordinary degenerative spondylosis C3-4, C4-5, C5-6 and C6-7. Facet osteoarthritis on the left at C2-3 and C3-4. Some bony foraminal narrowing but no compressive canal stenosis. Upper chest: Scarring at the lung apices. Other: None IMPRESSION: No acute or traumatic finding. Ordinary degenerative spondylosis and facet osteoarthritis as outlined above. Electronically Signed   By: Paulina Fusi M.D.   On: 07/31/2023 20:36   DG Shoulder Right Result Date: 07/31/2023 CLINICAL DATA:  Larey Seat.  Found on the floor. EXAM: RIGHT SHOULDER - 2+ VIEW COMPARISON:  07/07/2023 FINDINGS: Previous ORIF of right proximal humeral fracture. No definite acute fracture. Cannot completely rule out a avulsion fracture at the rotator cuff insertion, but I think it is more likely that the appearance is due to the chronic healed fracture. IMPRESSION: Previous ORIF of right proximal humeral fracture. No definite acute fracture. Cannot completely exclude a avulsion fracture at the rotator cuff insertion, but I think it is more likely that the appearance is due to the chronic healed fracture. Is the patient point tender at the greater tuberosity? Electronically Signed   By: Paulina Fusi M.D.   On: 07/31/2023 20:35   DG Chest Portable 1 View Result Date: 07/31/2023 CLINICAL DATA:  Found on the floor.  Intoxicated. EXAM: PORTABLE CHEST 1 VIEW COMPARISON:  07/07/2023 FINDINGS: Heart size is normal. Mediastinal shadows are normal. Chronic calcified granuloma in the medial right lung base. The lungs are otherwise clear. Old healed rib fractures. No acute regional finding. IMPRESSION: No active disease. Old healed rib fractures. Electronically Signed   By: Paulina Fusi M.D.   On:  07/31/2023 20:32   CT Head Wo Contrast Result Date: 07/31/2023 CLINICAL DATA:  Head trauma.  Abnormal mental status. EXAM: CT HEAD WITHOUT CONTRAST TECHNIQUE: Contiguous axial images were obtained from the base of the skull through the vertex without intravenous contrast. RADIATION DOSE REDUCTION: This exam was performed according to the departmental dose-optimization program which includes automated exposure control, adjustment of the mA and/or kV according to patient size and/or use of iterative reconstruction technique. COMPARISON:  07/05/2023 FINDINGS: Brain: Generalized brain atrophy. Mild chronic small-vessel ischemic change of the hemispheric white matter. No sign of acute infarction. 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 No  suspicion of acute component. No mass effect. 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 Vascular: No other vascular finding. Skull: Negative Sinuses/Orbits: Small amount of fluid in the left maxillary sinus. Orbits negative. Other: None IMPRESSION: No acute or traumatic finding. Atrophy and chronic small-vessel ischemic changes of the white matter. Chronic arachnoid cyst at the anterior middle cranial fossa on the left. 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 No suspicion of acute component. No mass effect. 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 Electronically Signed   By: Paulina Fusi M.D.   On: 07/31/2023 20:31   Assessment/Plan Richard Andrews is a 63 y.o. male with medical history significant for severe alcohol use disorder, history of complicated withdrawals, acute traumatic subdural hematoma, presumed liver cirrhosis with multiple recent hospital admissions who is now being admitted to the hospital with lactic acidosis after  an unwitnessed fall.  Unwitnessed fall-unclear etiology, patient is chronically weak and inebriated so could simply be mechanical.  Patient and his wife deny that he lost consciousness.  However he could have experienced seizure, cardiac event, or other.  Troponin is negative, no acute findings on cardiac telemetry.  Seizure felt less likely as patient's wife states he was not on his own for very long at all, and was not confused when found on the ground. -Observation admission -Monitor on telemetry -Trend troponin -PT consult  Severe lactic acidosis-without evidence of acute infection, I suspect this may be due to his inebriation versus thiamine deficiency. -Continue hydration -Check thiamine level -Trend lactate  History of refeeding syndrome-currently potassium is only marginally low, Phos is normal, magnesium is low. -Replete magnesium  Right shoulder pain-he does have a history of prior humerus fracture and some chronic discomfort in his right shoulder.  X-ray as above cannot definitively rule out acute fracture.  Patient continues to complain of right shoulder pain. -CT right shoulder without contrast  Alcohol abuse disorder-presents inebriated, patient has a history of complicated alcohol withdrawal, including DTs -Thiamine, folate, multivitamin -IV Ativan per CIWA protocol  History of presumed liver cirrhosis-continue lactulose twice daily  DVT prophylaxis: SCDs only due to thrombocytopenia    Code Status: Limited: Do not attempt resuscitation (DNR) -DNR-LIMITED -Do Not Intubate/DNI , confirmed with the patient's wife at the time of admission.  Consults called: None  Admission status: Observation  Time spent: 54 minutes  Jaycen Vercher Sharlette Dense MD [facility] Pager [phone]  If 7PM-7AM, please contact night-coverage [email or web address] Password [facility]  08/01/2023, 8:35 AM

## 2023-08-02 ENCOUNTER — Inpatient Hospital Stay: Payer: BC Managed Care – PPO | Admitting: Medical-Surgical

## 2023-08-02 DIAGNOSIS — F10939 Alcohol use, unspecified with withdrawal, unspecified: Secondary | ICD-10-CM

## 2023-08-02 DIAGNOSIS — E872 Acidosis, unspecified: Secondary | ICD-10-CM | POA: Diagnosis not present

## 2023-08-02 LAB — CBC
HCT: 27.9 % — ABNORMAL LOW (ref 39.0–52.0)
Hemoglobin: 9.1 g/dL — ABNORMAL LOW (ref 13.0–17.0)
MCH: 33.3 pg (ref 26.0–34.0)
MCHC: 32.6 g/dL (ref 30.0–36.0)
MCV: 102.2 fL — ABNORMAL HIGH (ref 80.0–100.0)
Platelets: 42 10*3/uL — ABNORMAL LOW (ref 150–400)
RBC: 2.73 MIL/uL — ABNORMAL LOW (ref 4.22–5.81)
RDW: 14.7 % (ref 11.5–15.5)
WBC: 3.8 10*3/uL — ABNORMAL LOW (ref 4.0–10.5)
nRBC: 0 % (ref 0.0–0.2)

## 2023-08-02 LAB — COMPREHENSIVE METABOLIC PANEL
ALT: 14 U/L (ref 0–44)
AST: 38 U/L (ref 15–41)
Albumin: 3.1 g/dL — ABNORMAL LOW (ref 3.5–5.0)
Alkaline Phosphatase: 79 U/L (ref 38–126)
Anion gap: 8 (ref 5–15)
BUN: 6 mg/dL — ABNORMAL LOW (ref 8–23)
CO2: 30 mmol/L (ref 22–32)
Calcium: 8 mg/dL — ABNORMAL LOW (ref 8.9–10.3)
Chloride: 94 mmol/L — ABNORMAL LOW (ref 98–111)
Creatinine, Ser: 0.58 mg/dL — ABNORMAL LOW (ref 0.61–1.24)
GFR, Estimated: >60 mL/min (ref >60)
Glucose, Bld: 100 mg/dL — ABNORMAL HIGH (ref 70–99)
Potassium: 2.2 mmol/L — CL (ref 3.5–5.1)
Sodium: 132 mmol/L — ABNORMAL LOW (ref 135–145)
Total Bilirubin: 2.1 mg/dL — ABNORMAL HIGH (ref 0.0–1.2)
Total Protein: 6.4 g/dL — ABNORMAL LOW (ref 6.5–8.1)

## 2023-08-02 LAB — HEMOGLOBIN AND HEMATOCRIT, BLOOD
HCT: 26.5 % — ABNORMAL LOW (ref 39.0–52.0)
Hemoglobin: 9.1 g/dL — ABNORMAL LOW (ref 13.0–17.0)

## 2023-08-02 LAB — MAGNESIUM: Magnesium: 1.5 mg/dL — ABNORMAL LOW (ref 1.7–2.4)

## 2023-08-02 MED ORDER — ACETAMINOPHEN 325 MG PO TABS
650.0000 mg | ORAL_TABLET | Freq: Four times a day (QID) | ORAL | Status: DC | PRN
Start: 2023-08-02 — End: 2023-08-11
  Administered 2023-08-02 – 2023-08-04 (×5): 650 mg via ORAL
  Filled 2023-08-02 (×5): qty 2

## 2023-08-02 MED ORDER — CHLORDIAZEPOXIDE HCL 25 MG PO CAPS
25.0000 mg | ORAL_CAPSULE | Freq: Four times a day (QID) | ORAL | Status: AC
  Administered 2023-08-02 – 2023-08-03 (×3): 25 mg via ORAL
  Filled 2023-08-02 (×3): qty 1

## 2023-08-02 MED ORDER — LOPERAMIDE HCL 2 MG PO CAPS: 2.0000 mg | ORAL_CAPSULE | ORAL | Status: DC | PRN

## 2023-08-02 MED ORDER — CHLORDIAZEPOXIDE HCL 25 MG PO CAPS
25.0000 mg | ORAL_CAPSULE | Freq: Three times a day (TID) | ORAL | Status: AC
  Administered 2023-08-03 – 2023-08-04 (×3): 25 mg via ORAL
  Filled 2023-08-02 (×3): qty 1

## 2023-08-02 MED ORDER — CHLORDIAZEPOXIDE HCL 25 MG PO CAPS
25.0000 mg | ORAL_CAPSULE | Freq: Once | ORAL | Status: AC
  Administered 2023-08-02: 25 mg via ORAL
  Filled 2023-08-02: qty 1

## 2023-08-02 MED ORDER — CHLORDIAZEPOXIDE HCL 25 MG PO CAPS
25.0000 mg | ORAL_CAPSULE | Freq: Every day | ORAL | Status: AC
  Administered 2023-08-05: 25 mg via ORAL
  Filled 2023-08-02: qty 1

## 2023-08-02 MED ORDER — THIAMINE HCL 100 MG/ML IJ SOLN
100.0000 mg | Freq: Every day | INTRAMUSCULAR | Status: DC
  Filled 2023-08-02: qty 2

## 2023-08-02 MED ORDER — OXYCODONE HCL 5 MG PO TABS
5.0000 mg | ORAL_TABLET | Freq: Four times a day (QID) | ORAL | Status: DC | PRN
  Administered 2023-08-03 – 2023-08-08 (×4): 5 mg via ORAL
  Filled 2023-08-02 (×5): qty 1

## 2023-08-02 MED ORDER — THIAMINE MONONITRATE 100 MG PO TABS
100.0000 mg | ORAL_TABLET | Freq: Every day | ORAL | Status: DC
  Administered 2023-08-03 – 2023-08-11 (×9): 100 mg via ORAL
  Filled 2023-08-02 (×9): qty 1

## 2023-08-02 MED ORDER — POTASSIUM CHLORIDE CRYS ER 20 MEQ PO TBCR
40.0000 meq | EXTENDED_RELEASE_TABLET | Freq: Three times a day (TID) | ORAL | Status: AC
  Administered 2023-08-02 – 2023-08-03 (×4): 40 meq via ORAL
  Filled 2023-08-02 (×4): qty 2

## 2023-08-02 MED ORDER — CHLORDIAZEPOXIDE HCL 25 MG PO CAPS
25.0000 mg | ORAL_CAPSULE | ORAL | Status: AC
Start: 2023-08-04 — End: 2023-08-05
  Administered 2023-08-04 – 2023-08-05 (×2): 25 mg via ORAL
  Filled 2023-08-02 (×2): qty 1

## 2023-08-02 MED ORDER — ONDANSETRON 4 MG PO TBDP: 4.0000 mg | ORAL_TABLET | Freq: Four times a day (QID) | ORAL | Status: AC | PRN

## 2023-08-02 MED ORDER — MAGNESIUM SULFATE 2 GM/50ML IV SOLN
2.0000 g | Freq: Once | INTRAVENOUS | Status: AC
  Administered 2023-08-02: 2 g via INTRAVENOUS
  Filled 2023-08-02: qty 50

## 2023-08-02 MED ORDER — HYDROXYZINE HCL 25 MG PO TABS
25.0000 mg | ORAL_TABLET | Freq: Four times a day (QID) | ORAL | Status: AC | PRN
  Administered 2023-08-02: 25 mg via ORAL
  Filled 2023-08-02: qty 1

## 2023-08-02 MED ORDER — POTASSIUM CHLORIDE 10 MEQ/100ML IV SOLN
10.0000 meq | INTRAVENOUS | Status: AC
  Administered 2023-08-02 (×6): 10 meq via INTRAVENOUS
  Filled 2023-08-02 (×7): qty 100

## 2023-08-02 NOTE — Progress Notes (Signed)
PROGRESS NOTE  Richard Andrews    DOB: 05-16-1961, 63 y.o.  ZOX:[id number]    Code Status: Limited: Do not attempt resuscitation (DNR) -DNR-LIMITED -Do Not Intubate/DNI    DOA: 07/31/2023   LOS: 1   Brief hospital course  Richard Andrews is a 63 y.o. 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  Patient describes several falls and frequent bleeding from these injuries.  ER course: tachycardic, severely elevated lactic acid level.  However there are no signs of acute infection, abdomen is nonacute, no signs or symptoms of acute infection. CT of right shoulder showed proper healing of historical humeral fracture without any acute fractures seen.  They were initially treated with Ativan for suspected alcohol withdrawal as well as other supportive care including IV fluids and analgesia.   Patient was admitted to medicine service for further workup and management of unwitnessed fall and encephalopathy as outlined in detail below.  08/02/23 -stable and oriented to self and location  Assessment & Plan  Principal Problem:   Lactic acidosis  Unwitnessed fall-unclear etiology, likely multifactorial as patient is chronically weak with malnutrition and presumed long-term alcohol consumption.  Denied loss of consciousness after fall.  No acute fractures seen on CT imaging and shows expected healing of prior humeral fracture however he does have several superficial wounds - Monitor on telemetry -PT consult -Wound care   Hypokalemia  critically low potassium-has history of refeeding syndrome.  K+ 2.2 Monitor and replete potassium and magnesium today IV and p.o. BMP a.m.    Alcohol  dependence-unknown last drink.  Alcohol level on admission was 378 - Continue thiamine, folate, multivitamin -IV Ativan per CIWA protocol -Adding Librium taper   History of presumed liver cirrhosis-ammonia level 33 on presentation -Continue twice daily lactulose with goal of 2-3 bowel movements per day  Body mass index is 16.91 kg/m.  VTE ppx: SCDs Start: 08/01/23 8119   Diet:     Diet   Diet regular Room service appropriate? Yes; Fluid consistency: Thin   Consultants: None   Subjective 08/02/23    Pt reports mild right arm pain.  Denies any other concerns at this time.  Asked him about the multiple bruises all over his body and he states "I have always bruised very easily"   Objective   Vitals:   08/01/23 1900 08/01/23 2002 08/01/23 2312 08/02/23 0342  BP: 123/86 (!) 132/94 (!) 126/91 123/81  Pulse: (!) 101 (!) 101 (!) 103 (!) 109  Resp: 15 16 16 16   Temp:  98.6 F (37 C) 98.7 F (37.1 C) 98.7 F (37.1 C)  TempSrc:  Oral Oral Oral  SpO2: 99% 99% 99% 98%  Weight:  55 kg    Height:  5\' 11"  (1.803 m)      Intake/Output Summary (Last 24 hours) at 08/02/2023 0747 Last data filed at 08/01/2023 0900 Gross per 24 hour  Intake --  Output 20 ml  Net -20 ml   Filed Weights   08/01/23 2002  Weight: 55 kg     Physical Exam:  General: awake, alert, NAD HEENT: atraumatic, clear conjunctiva, anicteric sclera, MMM, hearing grossly normal Respiratory: normal respiratory effort. Cardiovascular: quick capillary refill, normal S1/S2, RRR, no JVD, murmurs Gastrointestinal: soft,  NT, ND Nervous: A&O x3. no gross focal neurologic deficits, normal speech Extremities: moves all equally, no edema, normal tone Skin: Ecchymosis as well as abrasions diffusely on his extremities.  Mild deformity of right elbow which is not tender to palpation.  He has full range of motion of his extremities and no perceivable tremor at this time Psychiatry: normal mood, congruent affect  Labs   I  have personally reviewed the following labs and imaging studies CBC    Component Value Date/Time   WBC 3.8 (L) 08/02/2023 0526   RBC 2.73 (L) 08/02/2023 0526   HGB 9.1 (L) 08/02/2023 0526   HGB 10.7 (L) 05/25/2023 1044   HCT 27.9 (L) 08/02/2023 0526   HCT 31.5 (L) 05/25/2023 1044   PLT 42 (L) 08/02/2023 0526   PLT 56 (LL) 05/25/2023 1044   MCV 102.2 (H) 08/02/2023 0526   MCV 100 (H) 05/25/2023 1044   MCH 33.3 08/02/2023 0526   MCHC 32.6 08/02/2023 0526   RDW 14.7 08/02/2023 0526   RDW 14.2 05/25/2023 1044   LYMPHSABS 2.2 07/31/2023 2123   LYMPHSABS 0.7 05/25/2023 1044   MONOABS 0.9 07/31/2023 2123   EOSABS 0.1 07/31/2023 2123   EOSABS 0.1 05/25/2023 1044   BASOSABS 0.1 07/31/2023 2123   BASOSABS 0.0 05/25/2023 1044      Latest Ref Rng & Units 08/02/2023    5:26 AM 07/31/2023    9:23 PM 07/13/2023    7:17 AM  BMP  Glucose 70 - 99 mg/dL 161  096  045   BUN 8 - 23 mg/dL 6  5  11    Creatinine 0.61 - 1.24 mg/dL 4.09  8.11  9.14   Sodium 135 - 145 mmol/L 132  139  132   Potassium 3.5 - 5.1 mmol/L 2.2  3.3  3.5   Chloride 98 - 111 mmol/L 94  98  100   CO2 22 - 32 mmol/L 30  19  22    Calcium 8.9 - 10.3 mg/dL 8.0  8.4  8.8     CT SHOULDER RIGHT WO CONTRAST Result Date: 08/01/2023 CLINICAL DATA:  Shoulder pain. Stress fracture suspected. Larey Seat and was found on floor. Prior ORIF of right proximal humeral fracture. EXAM: CT OF THE UPPER RIGHT EXTREMITY WITHOUT CONTRAST TECHNIQUE: Multidetector CT imaging of the upper right extremity was performed according to the standard protocol. RADIATION DOSE REDUCTION: This exam was performed according to the departmental dose-optimization program which includes automated exposure control, adjustment of the mA and/or kV according to patient size and/or use of iterative reconstruction technique. COMPARISON:  Right shoulder radiographs 07/07/2023, 05/03/2023, 11/15/2022, 09/24/2022 FINDINGS: Bones/Joint/Cartilage Redemonstration of proximal humeral  lateral plate and screw fixation of the previously seen comminuted right humeral surgical neck and greater tuberosity fracture that was acute on 09/24/2022 radiographs. One of the screws extending through the inferior medial humeral neck is approximately 3 mm proud (axial series 2, image 43, coronal series 7, image 63) to the medial humeral head articular surface. Minimal 1 mm extension of the most anterior superior screw through the anterior superior humeral head articular surface (sagittal series 8, image 90 and axial series 2, image 36). There appears to be partial fusion and partial far anterior lucency (coronal series 7, image 58) of the prior now remote proximal right humeral surgical neck fracture. This appears to be the baseline how this fracture healed. There is mild irregularity within the superior aspect of the greater tuberosity as seen on recent radiographs, which appears to be from  the old fracture. No definite acute fracture within the visualized proximal right humerus. Severe inferior glenohumeral joint space narrowing and bone-on-bone contact. Large inferior humeral head-neck junction degenerative osteophytosis. Subacute to chronic posterolateral right fourth and posterior right sixth and seventh rib fractures, with callus formation and some areas of persistent fracture line lucency. Ligaments Suboptimally assessed by CT. Muscles and Tendons No rotator cuff muscle atrophy. Soft tissues Mild posterior dependent subpleural likely subsegmental atelectasis within the partially visualized right upper lung. IMPRESSION: 1. Redemonstration of proximal humeral lateral plate and screw fixation of the previously seen comminuted right humeral surgical neck and greater tuberosity fracture that was acute on 09/24/2022 radiographs. There appears to be partial fusion and partial far anterior lucency of the prior now remote proximal right humeral surgical neck fracture. This appears to be the baseline how this  fracture healed, and no definite new acute fracture is seen. 2. Severe inferior glenohumeral joint space narrowing and bone-on-bone contact. 3. Subacute to chronic posterolateral right fourth and posterior right sixth and seventh rib fractures, with callus formation and some areas of persistent fracture line lucency. Electronically Signed   By: Neita Garnet M.D.   On: 08/01/2023 11:54   CT Cervical Spine Wo Contrast Result Date: 07/31/2023 CLINICAL DATA:  Found on the floor.  Neck pain. EXAM: CT CERVICAL SPINE WITHOUT CONTRAST TECHNIQUE: Multidetector CT imaging of the cervical spine was performed without intravenous contrast. Multiplanar CT image reconstructions were also generated. RADIATION DOSE REDUCTION: This exam was performed according to the departmental dose-optimization program which includes automated exposure control, adjustment of the mA and/or kV according to patient size and/or use of iterative reconstruction technique. COMPARISON:  01/22/2023 FINDINGS: Alignment: No malalignment. Skull base and vertebrae: No regional fracture. Soft tissues and spinal canal: No significant soft tissue finding. Disc levels: Ordinary degenerative spondylosis C3-4, C4-5, C5-6 and C6-7. Facet osteoarthritis on the left at C2-3 and C3-4. Some bony foraminal narrowing but no compressive canal stenosis. Upper chest: Scarring at the lung apices. Other: None IMPRESSION: No acute or traumatic finding. Ordinary degenerative spondylosis and facet osteoarthritis as outlined above. Electronically Signed   By: Paulina Fusi M.D.   On: 07/31/2023 20:36   DG Shoulder Right Result Date: 07/31/2023 CLINICAL DATA:  Larey Seat.  Found on the floor. EXAM: RIGHT SHOULDER - 2+ VIEW COMPARISON:  07/07/2023 FINDINGS: Previous ORIF of right proximal humeral fracture. No definite acute fracture. Cannot completely rule out a avulsion fracture at the rotator cuff insertion, but I think it is more likely that the appearance is due to the chronic  healed fracture. IMPRESSION: Previous ORIF of right proximal humeral fracture. No definite acute fracture. Cannot completely exclude a avulsion fracture at the rotator cuff insertion, but I think it is more likely that the appearance is due to the chronic healed fracture. Is the patient point tender at the greater tuberosity? Electronically Signed   By: Paulina Fusi M.D.   On: 07/31/2023 20:35   DG Chest Portable 1 View Result Date: 07/31/2023 CLINICAL DATA:  Found on the floor.  Intoxicated. EXAM: PORTABLE CHEST 1 VIEW COMPARISON:  07/07/2023 FINDINGS: Heart size is normal. Mediastinal shadows are normal. Chronic calcified granuloma in the medial right lung base. The lungs are otherwise clear. Old healed rib fractures. No acute regional finding. IMPRESSION: No active disease. Old healed rib fractures. Electronically Signed   By: Paulina Fusi M.D.   On: 07/31/2023 20:32   CT Head Wo Contrast Result Date: 07/31/2023 CLINICAL DATA:  Head trauma.  Abnormal mental status. EXAM: CT HEAD WITHOUT CONTRAST TECHNIQUE: Contiguous axial images were obtained from the base of the skull through the vertex without intravenous contrast. RADIATION DOSE REDUCTION: This exam was performed according to the departmental dose-optimization program which includes automated exposure control, adjustment of the mA and/or kV according to patient size and/or use of iterative reconstruction technique. COMPARISON:  07/05/2023 FINDINGS: Brain: Generalized brain atrophy. Mild chronic small-vessel ischemic change of the hemispheric white matter. No sign of acute infarction. 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 No suspicion of acute component. No mass effect. 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 Vascular: No other vascular finding. Skull: Negative Sinuses/Orbits: Small  amount of fluid in the left maxillary sinus. Orbits negative. Other: None IMPRESSION: No acute or traumatic finding. Atrophy and chronic small-vessel ischemic changes of the white matter. Chronic arachnoid cyst at the anterior middle cranial fossa on the left. 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 No suspicion of acute component. No mass effect. 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 Electronically Signed   By: Paulina Fusi M.D.   On: 07/31/2023 20:31    Disposition Plan & Communication  Patient status: Inpatient  Admitted From: Home Planned disposition location: Skilled nursing facility Anticipated discharge date: 2/21 pending clinical stabilization  Family Communication: None at baseline   Author: Leeroy Bock, DO [facility] 08/02/2023, 7:47 AM   Available by Epic secure chat 7AM-7PM. If 7PM-7AM, please contact night-coverage.  [facility] contact information found on [email or web address].

## 2023-08-02 NOTE — Evaluation (Signed)
Physical Therapy Evaluation Patient Details Name: Richard Andrews MRN: [id number] DOB: 09-Oct-1960 Today's Date: 08/02/2023  History of Present Illness  63 yo male admitted with lactic acidosis after falling at home. Hx of ETOH abuse, complicated withdrawals, SDH, Cdiff, cirrhosis, rib fxs  Clinical Impression  On eval, pt required Min-Mod A for mobility. He was able to stand and take several side steps along the bed with use of a RW. Pt presents with general weakness, decreased activity tolerance, impaired gait/balance, impaired cognition. No family present during session. Patient will benefit from continued inpatient follow up therapy, <3 hours/day         If plan is discharge home, recommend the following: A little help with walking and/or transfers;A little help with bathing/dressing/bathroom;Assistance with cooking/housework;Assist for transportation;Help with stairs or ramp for entrance   Can travel by private vehicle        Equipment Recommendations None recommended by PT  Recommendations for Other Services       Functional Status Assessment Patient has had a recent decline in their functional status and/or demonstrates limited ability to make significant improvements in function in a reasonable and predictable amount of time     Precautions / Restrictions Precautions Precautions: Fall Precaution/Restrictions Comments: incontinent Restrictions Weight Bearing Restrictions Per Provider Order: No      Mobility  Bed Mobility Overal bed mobility: Needs Assistance Bed Mobility: Supine to Sit, Sit to Supine Rolling: Contact guard assist     Sit to supine: Contact guard assist   General bed mobility comments: min cues, HOB elevated and increased time    Transfers Overall transfer level: Needs assistance Equipment used: Rolling walker (2 wheels) Transfers: Sit to/from Stand Sit to Stand: Mod assist           General transfer comment: Assist to power up, stabilize,  control descent. Cues for safety, technique, hand placement.    Ambulation/Gait Ambulation/Gait assistance: Min assist   Assistive device: Rolling walker (2 wheels)       Pre-gait activities: side steps along bedside with use of RW. Min A. Fatigues quickly/easily.    Stairs            Wheelchair Mobility     Tilt Bed    Modified Rankin (Stroke Patients Only)       Balance Overall balance assessment: Needs assistance, History of Falls         Standing balance support: Bilateral upper extremity supported, Reliant on assistive device for balance, During functional activity Standing balance-Leahy Scale: Poor                               Pertinent Vitals/Pain Pain Assessment Pain Assessment: Faces Faces Pain Scale: Hurts little more Pain Location: "all over" Pain Descriptors / Indicators: Grimacing, Aching Pain Intervention(s): Limited activity within patient's tolerance, Monitored during session, Repositioned    Home Living Family/patient expects to be discharged to:: Unsure Living Arrangements: Other relatives Available Help at Discharge: Family;Available PRN/intermittently Type of Home: House Home Access: Ramped entrance;Stairs to enter       Home Layout: Two level;Able to live on main level with bedroom/bathroom;1/2 bath on main level Home Equipment: Rolling Walker (2 wheels);Wheelchair - manual Additional Comments: Pt states he lives with his brother in law, uses w/c for mobility around home, help with IADLs. Pt reports he just retired from [employer] recently  Some information taken from previous encounter. Pt info is questionalbe with cognition. (info  taken from previous admission)    Prior Function Prior Level of Function : Needs assist;Patient poor historian/Family not available             Mobility Comments: "don't walk much". uses w/c around home. (info taken from previous admission) ADLs Comments: Ind with ADLs "most of the  time" but reports his Bro-in-law assists PRN.  Again, pt a compromised hiostorian. (info taken from previous admission)     Extremity/Trunk Assessment   Upper Extremity Assessment Upper Extremity Assessment: Defer to OT evaluation    Lower Extremity Assessment Lower Extremity Assessment: Generalized weakness    Cervical / Trunk Assessment Cervical / Trunk Assessment: Kyphotic Cervical / Trunk Exceptions: Very tremulous throughout entire body, especially with standing.  Communication   Communication Communication: No apparent difficulties    Cognition Arousal: Alert Behavior During Therapy: Flat affect   PT - Cognitive impairments: No family/caregiver present to determine baseline                         Following commands: Intact       Cueing Cueing Techniques: Verbal cues     General Comments      Exercises     Assessment/Plan    PT Assessment Patient needs continued PT services  PT Problem List Decreased strength;Decreased balance;Decreased cognition;Decreased knowledge of precautions;Decreased range of motion;Decreased mobility;Decreased knowledge of use of DME;Decreased activity tolerance;Decreased safety awareness;Decreased skin integrity       PT Treatment Interventions DME instruction;Gait training;Functional mobility training;Therapeutic activities;Therapeutic exercise;Patient/family education;Balance training    PT Goals (Current goals can be found in the Care Plan section)  Acute Rehab PT Goals Patient Stated Goal: return home PT Goal Formulation: With patient Time For Goal Achievement: 08/16/23 Potential to Achieve Goals: Fair    Frequency Min 1X/week     Co-evaluation               AM-PAC PT "6 Clicks" Mobility  Outcome Measure Help needed turning from your back to your side while in a flat bed without using bedrails?: A Little Help needed moving from lying on your back to sitting on the side of a flat bed without using  bedrails?: A Little Help needed moving to and from a bed to a chair (including a wheelchair)?: A Little Help needed standing up from a chair using your arms (e.g., wheelchair or bedside chair)?: A Lot Help needed to walk in hospital room?: A Lot Help needed climbing 3-5 steps with a railing? : A Lot 6 Click Score: 15    End of Session   Activity Tolerance: Patient tolerated treatment well;Patient limited by fatigue Patient left: in bed;with call bell/phone within reach;with bed alarm set   PT Visit Diagnosis: History of falling (Z91.81);Muscle weakness (generalized) (M62.81);Unsteadiness on feet (R26.81);Difficulty in walking, not elsewhere classified (R26.2)    Time: 1610-9604 PT Time Calculation (min) (ACUTE ONLY): 15 min   Charges:   PT Evaluation $PT Eval Low Complexity: 1 Low   PT General Charges $$ ACUTE PT VISIT: 1 Visit            Faye Ramsay, PT Acute Rehabilitation  Office: [phone]

## 2023-08-03 DIAGNOSIS — F10939 Alcohol use, unspecified with withdrawal, unspecified: Secondary | ICD-10-CM | POA: Diagnosis not present

## 2023-08-03 DIAGNOSIS — E872 Acidosis, unspecified: Secondary | ICD-10-CM | POA: Diagnosis not present

## 2023-08-03 LAB — BASIC METABOLIC PANEL
Anion gap: 8 (ref 5–15)
BUN: 11 mg/dL (ref 8–23)
CO2: 26 mmol/L (ref 22–32)
Calcium: 8.3 mg/dL — ABNORMAL LOW (ref 8.9–10.3)
Chloride: 99 mmol/L (ref 98–111)
Creatinine, Ser: 0.8 mg/dL (ref 0.61–1.24)
GFR, Estimated: >60 mL/min (ref >60)
Glucose, Bld: 87 mg/dL (ref 70–99)
Potassium: 3.9 mmol/L (ref 3.5–5.1)
Sodium: 133 mmol/L — ABNORMAL LOW (ref 135–145)

## 2023-08-03 MED ORDER — PANTOPRAZOLE SODIUM 40 MG PO TBEC
40.0000 mg | DELAYED_RELEASE_TABLET | Freq: Every day | ORAL | Status: DC
  Administered 2023-08-03 – 2023-08-11 (×9): 40 mg via ORAL
  Filled 2023-08-03 (×9): qty 1

## 2023-08-03 NOTE — Progress Notes (Signed)
Initial Nutrition Assessment  DOCUMENTATION CODES:   Underweight  INTERVENTION:   -Ensure Plus High Protein po TID, each supplement provides 350 kcal and 20 grams of protein.  -Continue CIWA vitamin supplementation -Magic cup BID with meals, each supplement provides 290 kcal and 9 grams of protein   NUTRITION DIAGNOSIS:   Increased nutrient needs related to chronic illness (alcohol abuse) as evidenced by estimated needs.  GOAL:   Patient will meet greater than or equal to 90% of their needs  MONITOR:   PO intake, Supplement acceptance  REASON FOR ASSESSMENT:   Malnutrition Screening Tool    ASSESSMENT:   63 y.o. 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  Patient consuming 50-100% of meals. Has a history of alcohol abuse, alcohol level elevated at admission.  Pt has been started on vitamin supplements via CIWA and Ensure supplements.  Per weight records, pt has lost 18 lbs since 11/21/22 (12% wt loss x 8 months, insignificant for time frame). Weight loss concerning given pt is underweight and continues to drink. Suspect malnutrition persists.  Medications: Folic acid, Lasix, Lactulose, Multivitamin with minerals daily, KLOR-CON, Thiamine   Labs reviewed: Low Na  NUTRITION - FOCUSED PHYSICAL EXAM:  Unable to complete, working remotely  Diet Order:   Diet Order             Diet regular Room service appropriate? Yes; Fluid consistency: Thin  Diet effective now                   EDUCATION NEEDS:   Not appropriate for education at this time  Skin:  Skin Integrity Issues:: Other (Comment) Other: non-pressure wound of rt shoulder  Last  BM:  2/19 -type 5  Height:   Ht Readings from Last 1 Encounters:  08/01/23 5\' 11"  (1.803 m)    Weight:   Wt Readings from Last 1 Encounters:  08/01/23 55 kg    BMI:  Body mass index is 16.91 kg/m.  Estimated Nutritional Needs:   Kcal:  2200-2400  Protein:  110-120g  Fluid:  2L/day   Tilda Franco, MS, RD, LDN Inpatient Clinical Dietitian Contact via Secure chat

## 2023-08-03 NOTE — TOC Transition Note (Signed)
Transition of Care [facility]) - Discharge Note   Patient Details  Name: Richard Andrews MRN: [id number] Date of Birth: [birth year]  [facility] of [facility]) CM/SW Contact:  Lanier Clam, RN Phone Number: 08/03/2023, 11:09 AM   Clinical Narrative: Spoke to patient about etoh resources-provided on AVS. Patient declines ST SNF. HHC agencies oon or d/t etoh unable to accept-[facility]. Otpt PT set up & agreed by patient.Has rw,& own transport home. No further CM needs.      Final next level of care: OP Rehab Barriers to Discharge: No Barriers Identified   Patient Goals and CMS Choice            Discharge Placement                       Discharge Plan and Services Additional resources added to the After Visit Summary for                                       Social Drivers of Health (SDOH) Interventions SDOH Screenings   Food Insecurity: Food Insecurity Present (08/01/2023)  Housing: Low Risk  (08/01/2023)  Transportation Needs: No Transportation Needs (08/01/2023)  Utilities: Not At Risk (08/01/2023)  Depression (PHQ2-9): Low Risk  (03/07/2023)  Social Connections: Unknown (08/01/2023)  Recent Concern: Social Connections - Socially Isolated (07/06/2023)  Tobacco Use: High Risk (08/01/2023)     Readmission Risk Interventions    07/11/2023    4:08 PM  Readmission Risk Prevention Plan  Transportation Screening Complete  PCP or Specialist Appt within 3-5 Days Complete  HRI or Home Care Consult Complete  Social Work Consult for Recovery Care Planning/Counseling Complete  Palliative Care Screening Not Applicable  Medication Review Oceanographer) Complete

## 2023-08-03 NOTE — Evaluation (Signed)
Occupational Therapy Evaluation Patient Details Name: Richard Andrews MRN: [id number] DOB: [birth year] Today's Date: 08/03/2023   History of Present Illness   63 yo male admitted with lactic acidosis after falling at home. Hx of ETOH abuse, complicated withdrawals, SDH, Cdiff, cirrhosis, rib fxs     Clinical Impressions Patient is currently requiring assistance with ADLs including up to total assist with Lower body ADLs in EOB or standing position, and up to minimal assist with Upper body ADLs in EOB position, as well as  up to Max assist with functional transfers to [facility] with use of RW.   Current level of function is below patient's typical baseline.    During this evaluation, patient was limited by cognitive deficits with impaired orientation, memory, and reasoning, generalized weakness, impaired activity tolerance, all of which has the potential to impact patient's and/or caregivers' safety and independence during functional mobility, as well as performance for ADLs.    Patient lives with his brother in law who  is able to provide almost 24/7 supervision and assistance.  Patient demonstrates fair rehab potential, and should benefit from continued skilled occupational therapy services while in acute care to maximize safety, independence and quality of life at home.  Continued occupational therapy services are recommended. Patient will benefit from continued inpatient follow up therapy, <3 hours/day   ?      If plan is discharge home, recommend the following:   A lot of help with walking and/or transfers;Assistance with cooking/housework;Assist for transportation;A lot of help with bathing/dressing/bathroom;Supervision due to cognitive status;Help with stairs or ramp for entrance     Functional Status Assessment   Patient has had a recent decline in their functional status and demonstrates the ability to make significant improvements in function in a reasonable and predictable amount of  time.     Equipment Recommendations         Recommendations for Other Services         Precautions/Restrictions         Mobility Bed Mobility Overal bed mobility: Needs Assistance Bed Mobility: Supine to Sit, Sit to Supine     Supine to sit: Used rails, Contact guard Sit to supine: Supervision, Used rails        Transfers                          Balance Overall balance assessment: Needs assistance, History of Falls Sitting-balance support: Feet supported, Bilateral upper extremity supported, Single extremity supported Sitting balance-Leahy Scale: Fair   Postural control: Posterior lean Standing balance support: Bilateral upper extremity supported, Reliant on assistive device for balance, During functional activity Standing balance-Leahy Scale: Poor                             ADL either performed or assessed with clinical judgement   ADL Overall ADL's : Needs assistance/impaired     Grooming: Wash/dry hands;Sitting;Set up;Cueing for sequencing   Upper Body Bathing: Minimal assistance;Sitting;Cueing for sequencing   Lower Body Bathing: Maximal assistance;+2 for physical assistance;Sit to/from stand;Sitting/lateral leans Lower Body Bathing Details (indicate cue type and reason): Based on general assessment. Upper Body Dressing : Minimal assistance;Sitting   Lower Body Dressing: +2 for physical assistance;Maximal assistance;Cueing for sequencing;Sitting/lateral leans Lower Body Dressing Details (indicate cue type and reason): Unable to release hands from RW for standing ADLs. Toilet Transfer: Maximal assistance;Moderate assistance;Stand-pivot;Rolling walker (2 wheels);Cueing for sequencing;Cueing for safety;BSC/3in1 Toilet  Transfer Details (indicate cue type and reason): Pt stood from EOB to RW with Max As of 1. Once standing for a moment. pt improved to Min As. Pt then took steps to [facility] with RWand Mod As to Max As needed. Mod As to  lower to [facility].         Functional mobility during ADLs: Moderate assistance;Maximal assistance;Rolling walker (2 wheels)       Vision Baseline Vision/History:  (readers only) Ability to See in Adequate Light: 0 Adequate Vision Assessment?: No apparent visual deficits     Perception         Praxis         Pertinent Vitals/Pain Pain Assessment Pain Assessment: 0-10 Pain Score: 6  Pain Location: RT arm. RT lower leg. Pain Descriptors / Indicators: Aching Pain Intervention(s): Limited activity within patient's tolerance, Monitored during session, Repositioned     Extremity/Trunk Assessment Upper Extremity Assessment Upper Extremity Assessment: Left hand dominant;Generalized weakness RUE Deficits / Details: ROM WFL in supine but weak and MMT not performed due to residual 6/10 pain from previous injury.   Lower Extremity Assessment Lower Extremity Assessment: Generalized weakness   Cervical / Trunk Assessment Cervical / Trunk Assessment: Kyphotic Cervical / Trunk Exceptions: Very tremulous especially with standing. and stepping.   Communication     Cognition Arousal: Alert Behavior During Therapy: Flat affect Cognition: History of cognitive impairments             OT - Cognition Comments: Oriented to person, place, month. Not year or situation.                         Cueing  General Comments          Exercises     Shoulder Instructions      Home Living Family/patient expects to be discharged to:: Unsure Living Arrangements: Other relatives Available Help at Discharge: Family;Available 24 hours/day Type of Home: House Home Access: Ramped entrance;Stairs to enter Entrance Stairs-Number of Steps: Uses ramped entrance Entrance Stairs-Rails: Can reach both Home Layout: Two level;Able to live on main level with bedroom/bathroom;1/2 bath on main level Alternate Level Stairs-Number of Steps: approximately 14 Alternate Level Stairs-Rails:  Right;Left;Can reach both Bathroom Shower/Tub: Tub/shower unit (sink bathes)         Home Equipment: Agricultural consultant (2 wheels);Wheelchair - manual;Shower seat;Hand held shower head   Additional Comments: Pt states he lives with his brother in law, uses w/c for mobility around home, help with IADLs. Pt reports he just retired from [employer] recently  Some information taken from previous encounter. Pt info is questionalbe with cognition. (info taken from previous admission)      Prior Functioning/Environment Prior Level of Function : Needs assist;Patient poor historian/Family not available  Cognitive Assist : ADLs (cognitive)   ADLs (Cognitive): Intermittent cues Physical Assist : ADLs (physical)   ADLs (physical): IADLs Mobility Comments: "don't walk much". uses w/c around home.Uses both arms and feet to propel. ADLs Comments: Ind with ADLs "most of the time" but reports his Bro-in-law assists PRN.  Again, pt a compromised historian. (info taken from previous admission). Brother in law takes care of the IADLs.    OT Problem List: Decreased activity tolerance;Decreased range of motion;Decreased safety awareness;Decreased cognition;Decreased strength;Impaired balance (sitting and/or standing);Pain;Decreased knowledge of use of DME or AE;Impaired UE functional use;Cardiopulmonary status limiting activity;Decreased coordination   OT Treatment/Interventions: Self-care/ADL training;Therapeutic activities;Cognitive remediation/compensation;Therapeutic exercise;Patient/family education;Balance training;DME and/or AE instruction  OT Goals(Current goals can be found in the care plan section)   Acute Rehab OT Goals Patient Stated Goal: "Not lose my job". OT Goal Formulation: With patient Time For Goal Achievement: 08/17/23 Potential to Achieve Goals: Fair ADL Goals Pt Will Perform Grooming: with contact guard assist;standing Pt Will Perform Lower Body Dressing: with contact guard  assist;sitting/lateral leans;sit to/from stand;with adaptive equipment Pt Will Transfer to Toilet: ambulating;with contact guard assist Pt Will Perform Toileting - Clothing Manipulation and hygiene: with supervision;sitting/lateral leans;sit to/from stand Pt/caregiver will Perform Home Exercise Program: Increased ROM;Increased strength;Both right and left upper extremity;With Supervision   OT Frequency:  Min 1X/week    Co-evaluation              AM-PAC OT "6 Clicks" Daily Activity     Outcome Measure Help from another person eating meals?: None Help from another person taking care of personal grooming?: A Little Help from another person toileting, which includes using toliet, bedpan, or urinal?: A Lot Help from another person bathing (including washing, rinsing, drying)?: A Lot Help from another person to put on and taking off regular upper body clothing?: A Little Help from another person to put on and taking off regular lower body clothing?: Total 6 Click Score: 15   End of Session Equipment Utilized During Treatment: Gait belt;Rolling walker (2 wheels);Other (comment) Nurse Communication: Other (comment) (Pt had BM in [facility])  Activity Tolerance: Patient tolerated treatment well Patient left: in bed;with call bell/phone within reach;with bed alarm set;with nursing/sitter in room  OT Visit Diagnosis: Unsteadiness on feet (R26.81);Dizziness and giddiness (R42);Other abnormalities of gait and mobility (R26.89);Pain;History of falling (Z91.81);Other symptoms and signs involving cognitive function;Ataxia, unspecified (R27.0) Pain - Right/Left: Right Pain - part of body: Arm;Leg                Time: 4098-1191 OT Time Calculation (min): 36 min Charges:  OT General Charges $OT Visit: 1 Visit OT Evaluation $OT Eval Low Complexity: 1 Low OT Treatments $Self Care/Home Management : 8-22 mins  Victorino Dike, OT Acute Rehab Services Office: [phone] 08/03/2023   Theodoro Clock 08/03/2023, 3:07 PM

## 2023-08-03 NOTE — Progress Notes (Signed)
PROGRESS NOTE  Richard Andrews    DOB: August 07, 1960, 63 y.o.  WUJ:[id number]    Code Status: Limited: Do not attempt resuscitation (DNR) -DNR-LIMITED -Do Not Intubate/DNI    DOA: 07/31/2023   LOS: 2   Brief hospital course  Richard Andrews is a 63 y.o. 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  Patient describes several falls and frequent bleeding from these injuries.  ER course: tachycardic, severely elevated lactic acid level.  However there are no signs of acute infection, abdomen is nonacute, no signs or symptoms of acute infection. CT of right shoulder showed proper healing of historical humeral fracture without any acute fractures seen.  They were initially treated with Ativan for suspected alcohol withdrawal as well as other supportive care including IV fluids and analgesia.   Patient was admitted to medicine service for further workup and management of unwitnessed fall and encephalopathy as outlined in detail below.  08/03/23 -stable and oriented to self and location  Assessment & Plan  Principal Problem:   Lactic acidosis  Unwitnessed fall-unclear etiology, likely multifactorial as patient is chronically weak with malnutrition and presumed long-term alcohol consumption.  Denied loss of consciousness after fall.  No acute fractures seen on CT imaging and shows expected healing of prior humeral fracture however he does have several superficial wounds - Monitor on telemetry -PT consult recommending SNF.  TOC consulted -Wound care   Hypokalemia  critically low potassium-resolved.  Has history of refeeding syndrome.  K+ 2.2>3.9 Monitor and replete as needed BMP  a.m.    Alcohol dependence-unknown last drink.  Alcohol level on admission was 378 - Continue thiamine, folate, multivitamin -IV Ativan per CIWA protocol -Continue Librium taper   History of presumed liver cirrhosis-ammonia level 33 on presentation -Continue twice daily lactulose with goal of 2-3 bowel movements per day  Body mass index is 16.91 kg/m.  VTE ppx: SCDs Start: 08/01/23 9562  Diet:     Diet   Diet regular Room service appropriate? Yes; Fluid consistency: Thin   Consultants: None   Subjective 08/03/23    Pt reports no pain in his right arm today.  Patient informs me that he has to urinate and then proceeds to urinate in his coffee cup.   Objective   Vitals:   08/02/23 1620 08/02/23 1828 08/02/23 1952 08/03/23 0602  BP: 122/83 111/78 114/75 115/82  Pulse: 98 94 83 69  Resp:  10 20 20   Temp:  98.4 F (36.9 C) 98.3 F (36.8 C) 98.2 F (36.8 C)  TempSrc:  Oral Oral Oral  SpO2:  99% 98% 98%  Weight:      Height:        Intake/Output Summary (Last 24 hours) at 08/03/2023 0732 Last data filed at 08/02/2023 1758 Gross per 24 hour  Intake 969.22 ml  Output 250 ml  Net 719.22 ml   Filed Weights   08/01/23 2002  Weight: 55 kg     Physical Exam:  General: awake, alert, NAD HEENT: atraumatic, clear conjunctiva, anicteric sclera, MMM, hearing grossly normal Respiratory: normal respiratory effort. Cardiovascular: quick capillary refill, normal S1/S2, RRR, no JVD, murmurs Gastrointestinal: soft, NT, ND Nervous: A&O x3. no gross focal neurologic deficits, normal speech Extremities: moves all  equally, no edema, normal tone Skin: Ecchymosis as well as abrasions diffusely on his extremities.  Mild deformity of right elbow which is not tender to palpation.  He has full range of motion of his extremities and no perceivable tremor at this time.  Mildly saturated bandage on right shoulder. Psychiatry: normal mood, congruent affect  Labs   I have personally  reviewed the following labs and imaging studies CBC    Component Value Date/Time   WBC 3.8 (L) 08/02/2023 0526   RBC 2.73 (L) 08/02/2023 0526   HGB 9.1 (L) 08/02/2023 1004   HGB 10.7 (L) 05/25/2023 1044   HCT 26.5 (L) 08/02/2023 1004   HCT 31.5 (L) 05/25/2023 1044   PLT 42 (L) 08/02/2023 0526   PLT 56 (LL) 05/25/2023 1044   MCV 102.2 (H) 08/02/2023 0526   MCV 100 (H) 05/25/2023 1044   MCH 33.3 08/02/2023 0526   MCHC 32.6 08/02/2023 0526   RDW 14.7 08/02/2023 0526   RDW 14.2 05/25/2023 1044   LYMPHSABS 2.2 07/31/2023 2123   LYMPHSABS 0.7 05/25/2023 1044   MONOABS 0.9 07/31/2023 2123   EOSABS 0.1 07/31/2023 2123   EOSABS 0.1 05/25/2023 1044   BASOSABS 0.1 07/31/2023 2123   BASOSABS 0.0 05/25/2023 1044      Latest Ref Rng & Units 08/03/2023    5:06 AM 08/02/2023    5:26 AM 07/31/2023    9:23 PM  BMP  Glucose 70 - 99 mg/dL 87  161  096   BUN 8 - 23 mg/dL 11  6  5    Creatinine 0.61 - 1.24 mg/dL 0.45  4.09  8.11   Sodium 135 - 145 mmol/L 133  132  139   Potassium 3.5 - 5.1 mmol/L 3.9  2.2  3.3   Chloride 98 - 111 mmol/L 99  94  98   CO2 22 - 32 mmol/L 26  30  19    Calcium 8.9 - 10.3 mg/dL 8.3  8.0  8.4     CT SHOULDER RIGHT WO CONTRAST Result Date: 08/01/2023 CLINICAL DATA:  Shoulder pain. Stress fracture suspected. Larey Seat and was found on floor. Prior ORIF of right proximal humeral fracture. EXAM: CT OF THE UPPER RIGHT EXTREMITY WITHOUT CONTRAST TECHNIQUE: Multidetector CT imaging of the upper right extremity was performed according to the standard protocol. RADIATION DOSE REDUCTION: This exam was performed according to the departmental dose-optimization program which includes automated exposure control, adjustment of the mA and/or kV according to patient size and/or use of iterative reconstruction technique. COMPARISON:  Right shoulder radiographs 07/07/2023, 05/03/2023, 11/15/2022, 09/24/2022 FINDINGS: Bones/Joint/Cartilage Redemonstration of proximal humeral lateral plate and  screw fixation of the previously seen comminuted right humeral surgical neck and greater tuberosity fracture that was acute on 09/24/2022 radiographs. One of the screws extending through the inferior medial humeral neck is approximately 3 mm proud (axial series 2, image 43, coronal series 7, image 63) to the medial humeral head articular surface. Minimal 1 mm extension of the most anterior superior screw through the anterior superior humeral head articular surface (sagittal series 8, image 90 and axial series 2, image 36). There appears to be partial fusion and partial far anterior lucency (coronal series 7, image 58) of the prior now remote proximal right humeral surgical neck fracture. This appears to be the baseline how this fracture healed. There is mild irregularity within the superior aspect of the greater tuberosity as seen on recent radiographs, which appears to be from the old fracture. No definite acute fracture within  the visualized proximal right humerus. Severe inferior glenohumeral joint space narrowing and bone-on-bone contact. Large inferior humeral head-neck junction degenerative osteophytosis. Subacute to chronic posterolateral right fourth and posterior right sixth and seventh rib fractures, with callus formation and some areas of persistent fracture line lucency. Ligaments Suboptimally assessed by CT. Muscles and Tendons No rotator cuff muscle atrophy. Soft tissues Mild posterior dependent subpleural likely subsegmental atelectasis within the partially visualized right upper lung. IMPRESSION: 1. Redemonstration of proximal humeral lateral plate and screw fixation of the previously seen comminuted right humeral surgical neck and greater tuberosity fracture that was acute on 09/24/2022 radiographs. There appears to be partial fusion and partial far anterior lucency of the prior now remote proximal right humeral surgical neck fracture. This appears to be the baseline how this fracture healed, and no  definite new acute fracture is seen. 2. Severe inferior glenohumeral joint space narrowing and bone-on-bone contact. 3. Subacute to chronic posterolateral right fourth and posterior right sixth and seventh rib fractures, with callus formation and some areas of persistent fracture line lucency. Electronically Signed   By: Neita Garnet M.D.   On: 08/01/2023 11:54    Disposition Plan & Communication  Patient status: Inpatient  Admitted From: Home Planned disposition location: Skilled nursing facility Anticipated discharge date: 2/21 pending clinical stabilization  Family Communication: None at baseline   Author: Leeroy Bock, DO [facility] 08/03/2023, 7:32 AM   Available by Epic secure chat 7AM-7PM. If 7PM-7AM, please contact night-coverage.  [facility] contact information found on [email or web address].

## 2023-08-04 DIAGNOSIS — F10939 Alcohol use, unspecified with withdrawal, unspecified: Secondary | ICD-10-CM | POA: Diagnosis not present

## 2023-08-04 DIAGNOSIS — E872 Acidosis, unspecified: Secondary | ICD-10-CM | POA: Diagnosis not present

## 2023-08-04 NOTE — TOC Progression Note (Signed)
Transition of Care [facility]) - Progression Note    Patient Details  Name: Richard Andrews MRN: [id number] Date of Birth: 04/30/1961  [facility] of [facility]) CM/SW Contact  Dayson Aboud, Olegario Messier, RN Phone Number: 08/04/2023, 4:34 PM  Clinical Narrative:   Patient now in agreement to ST SNF-faxed out await bed offers,choice,then auth.    Expected Discharge Plan: Skilled Nursing Facility Barriers to Discharge: Continued Medical Work up  Expected Discharge Plan and Services                                               Social Determinants of Health (SDOH) Interventions SDOH Screenings   Food Insecurity: Food Insecurity Present (08/01/2023)  Housing: Low Risk  (08/01/2023)  Transportation Needs: No Transportation Needs (08/01/2023)  Utilities: Not At Risk (08/01/2023)  Depression (PHQ2-9): Low Risk  (03/07/2023)  Social Connections: Unknown (08/01/2023)  Recent Concern: Social Connections - Socially Isolated (07/06/2023)  Tobacco Use: High Risk (08/01/2023)    Readmission Risk Interventions    07/11/2023    4:08 PM  Readmission Risk Prevention Plan  Transportation Screening Complete  PCP or Specialist Appt within 3-5 Days Complete  HRI or Home Care Consult Complete  Social Work Consult for Recovery Care Planning/Counseling Complete  Palliative Care Screening Not Applicable  Medication Review Oceanographer) Complete

## 2023-08-04 NOTE — [place] FL2 (Signed)
[place] MEDICAID FL2 LEVEL OF CARE FORM     IDENTIFICATION  Patient Name: Richard Andrews Birthdate: January 19, 1961 Sex: male Admission Date (Current Location): 07/31/2023  [facility] and [place] Number:  Producer, television/film/video and Address:         Provider Number: [id number]  Attending Physician Name and Address:  Leeroy Bock, MD  Relative Name and Phone Number:  Trula Ore Khamis(spouse)[phone]    Current Level of Care: Hospital Recommended Level of Care: Skilled Nursing Facility Prior Approval Number:    Date Approved/Denied:   PASRR Number: [id number]  Discharge Plan: SNF    Current Diagnoses: Patient Active Problem List   Diagnosis Date Noted   Pancytopenia (HCC) 07/09/2023   Fever 07/07/2023   Right lateral 4th rib fracture 07/07/2023   Hypophosphatemia 07/05/2023   Apparent cirrhosis (HCC) 07/05/2023   Protein-calorie malnutrition, severe (HCC) 07/05/2023   Generalized weakness 07/05/2023   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   Kidney stone 02/16/2023   Colitis due to Clostridioides difficile 01/25/2023   History of seizure due to alcohol withdrawal 01/23/2023   History of bleeding peptic ulcer 01/23/2023   Hyponatremia 01/22/2023   Lactic acidosis 01/22/2023   Hypokalemia 01/22/2023   Hypomagnesemia 01/22/2023   Physical deconditioning 01/22/2023   Hyperbilirubinemia 01/22/2023   Hepatic steatosis 01/22/2023   SDH (subdural hematoma) (HCC) 01/22/2023   Starvation and alcoholic ketosis 21/30/8657   Closed nondisplaced fracture of left tibial plateau 11/21/2022   ICH (intracerebral hemorrhage) (HCC) 11/15/2022   Subdural hematoma (HCC) 10/07/2022   Fall 09/24/2022   Alcohol withdrawal syndrome without complication (HCC)    Gastritis and gastroduodenitis    Multiple gastric ulcers    Duodenal ulcer    Acute blood loss anemia    Alcohol withdrawal seizure with complication (HCC)    GI bleed 01/19/2021   QT prolongation 01/19/2021    Electrolyte imbalance 02/06/2020   Macrocytic anemia 02/06/2020   Acute pain of right wrist 02/06/2020   Encounter for orthopedic follow-up care 01/28/2020   Open Colles' fracture of right radius 01/10/2020   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   Increased ammonia level 12/21/2016   Thrombocytopenia (HCC) 12/21/2016   Macrocytosis 03/21/2016   Elevated lipase 03/21/2016   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   Alcohol use disorder, moderate, dependence (HCC) 02/12/2016   Essential hypertension 01/22/2015    Orientation RESPIRATION BLADDER Height & Weight     Self, Time, Situation, Place  Normal Continent Weight: 55 kg Height:  5\' 11"  (180.3 cm)  BEHAVIORAL SYMPTOMS/MOOD NEUROLOGICAL BOWEL NUTRITION STATUS      Continent Diet (Regular)  AMBULATORY STATUS COMMUNICATION OF NEEDS Skin     Verbally Normal                       Personal Care Assistance Level of Assistance  Bathing, Feeding, Dressing Bathing Assistance: Limited assistance Feeding assistance: Independent Dressing Assistance: Limited assistance     Functional Limitations Info  Sight, Hearing, Speech Sight Info: Impaired (readers) Hearing Info: Adequate Speech Info: Adequate    SPECIAL CARE FACTORS FREQUENCY  PT (By licensed PT), OT (By licensed OT)     PT Frequency: 5x week OT Frequency: 5x week            Contractures Contractures Info: Not present    Additional Factors Info  Code Status, Allergies Code Status Info: DNR Allergies Info: NKDA Psychotropic Info: desyrel         Current Medications (08/04/2023):  This is  the current hospital active medication list Current Facility-Administered Medications  Medication Dose Route Frequency Provider Last Rate Last Admin   acetaminophen (TYLENOL) tablet 650 mg  650 mg Oral Q6H PRN Leeroy Bock, MD   650 mg at 08/04/23 0955   albuterol (PROVENTIL) (2.5 MG/3ML) 0.083% nebulizer solution 2.5 mg  2.5 mg Nebulization Q2H PRN Kirby Crigler, Mir M, MD        chlordiazePOXIDE (LIBRIUM) capsule 25 mg  25 mg Oral Higinio Roger, MD       Followed by   Melene Muller ON 08/05/2023] chlordiazePOXIDE (LIBRIUM) capsule 25 mg  25 mg Oral Daily Leeroy Bock, MD       feeding supplement (ENSURE ENLIVE / ENSURE PLUS) liquid 237 mL  237 mL Oral TID BM Kirby Crigler, Mir M, MD   237 mL at 08/03/23 1953   folic acid (FOLVITE) tablet 1 mg  1 mg Oral Daily Dolly Rias, MD   1 mg at 08/04/23 0955   hydrOXYzine (ATARAX) tablet 25 mg  25 mg Oral Q6H PRN Leeroy Bock, MD   25 mg at 08/02/23 2040   ibuprofen (ADVIL) tablet 600 mg  600 mg Oral Q6H PRN Maryln Gottron, MD   600 mg at 08/02/23 0749   lactulose (CHRONULAC) 10 GM/15ML solution 20 g  20 g Oral BID Kirby Crigler, Mir M, MD   20 g at 08/04/23 0954   metoCLOPramide (REGLAN) injection 10 mg  10 mg Intravenous Q6H PRN Maryln Gottron, MD   10 mg at 08/01/23 0915   multivitamin with minerals tablet 1 tablet  1 tablet Oral Daily Dolly Rias, MD   1 tablet at 08/04/23 0955   ondansetron (ZOFRAN-ODT) disintegrating tablet 4 mg  4 mg Oral Q6H PRN Leeroy Bock, MD       oxyCODONE (Oxy IR/ROXICODONE) immediate release tablet 5 mg  5 mg Oral Q6H PRN Leeroy Bock, MD   5 mg at 08/03/23 0023   pantoprazole (PROTONIX) EC tablet 40 mg  40 mg Oral Daily Herby Abraham, RPH   40 mg at 08/04/23 1610   thiamine (VITAMIN B1) injection 100 mg  100 mg Intravenous Daily Segars, Christiane Ha, MD       Or   thiamine (VITAMIN B1) tablet 100 mg  100 mg Oral Daily Dolly Rias, MD   100 mg at 08/04/23 0957   traZODone (DESYREL) tablet 25 mg  25 mg Oral QHS PRN Maryln Gottron, MD   25 mg at 08/02/23 2039     Discharge Medications: Please see discharge summary for a list of discharge medications.  Relevant Imaging Results:  Relevant Lab Results:   Additional Information ss#[phone]  Charlsey Moragne, Olegario Messier, RN

## 2023-08-04 NOTE — Progress Notes (Signed)
Physical Therapy Treatment Patient Details Name: Richard Andrews MRN: [id number] DOB: 1961-01-27 Today's Date: 08/04/2023   History of Present Illness 63 yo male admitted with lactic acidosis after falling at home. Hx of ETOH abuse, complicated withdrawals, SDH, Cdiff, cirrhosis, rib fxs    PT Comments  Pt reported he just stood up with the nursing and took a few steps with +2 assistance. Pt declined to mobilize again. Pt was agreeable to bed exercises. Pt completed LE exercises with supervision to min assist. Pt reported difficulty breathing with there ex, but O2 stas at 97% on RA and no observable signs of dyspnea. Pt will continue to benefit from acute skilled PT to maximize mobility and independence for the next venue of care.   If plan is discharge home, recommend the following: A little help with walking and/or transfers;A little help with bathing/dressing/bathroom;Assistance with cooking/housework;Assist for transportation;Help with stairs or ramp for entrance   Can travel by private vehicle     Yes  Equipment Recommendations  None recommended by PT    Recommendations for Other Services       Precautions / Restrictions Precautions Precautions: Fall Restrictions Weight Bearing Restrictions Per Provider Order: No     Mobility  Bed Mobility               General bed mobility comments: Pt reported he just got OOB with nursing and took a few steps with +2 assist. Pt declined mobility due to fatigue.    Transfers                   General transfer comment: Pt declined    Ambulation/Gait               General Gait Details: Pt declined   Stairs             Wheelchair Mobility     Tilt Bed    Modified Rankin (Stroke Patients Only)       Balance                                            Communication Communication Communication: No apparent difficulties  Cognition Arousal: Alert Behavior During Therapy: Flat  affect   PT - Cognitive impairments: No family/caregiver present to determine baseline                         Following commands: Intact      Cueing Cueing Techniques: Verbal cues  Exercises General Exercises - Lower Extremity Ankle Circles/Pumps: AROM, Strengthening, Both, 15 reps, Supine Quad Sets: AROM, Both, 10 reps, Supine Short Arc Quad: AROM, Strengthening, Both, 10 reps, Supine Heel Slides: AAROM, 10 reps, Supine, Both, Strengthening Hip ABduction/ADduction: AAROM, Strengthening, Both, 10 reps, Supine Straight Leg Raises: AAROM, Strengthening, Both, 10 reps, Supine    General Comments        Pertinent Vitals/Pain Pain Assessment Pain Score: 6  Pain Location: RT arm. RT lower leg. Pain Descriptors / Indicators: Aching Pain Intervention(s): Limited activity within patient's tolerance, Monitored during session    Home Living                          Prior Function            PT Goals (current goals can now be found in the  care plan section) Progress towards PT goals: Progressing toward goals    Frequency    Min 1X/week      PT Plan      Co-evaluation              AM-PAC PT "6 Clicks" Mobility   Outcome Measure  Help needed turning from your back to your side while in a flat bed without using bedrails?: A Little Help needed moving from lying on your back to sitting on the side of a flat bed without using bedrails?: A Little Help needed moving to and from a bed to a chair (including a wheelchair)?: A Little Help needed standing up from a chair using your arms (e.g., wheelchair or bedside chair)?: A Lot Help needed to walk in hospital room?: A Lot Help needed climbing 3-5 steps with a railing? : A Lot 6 Click Score: 15    End of Session   Activity Tolerance: Patient tolerated treatment well;Patient limited by fatigue Patient left: in bed;with call bell/phone within reach;with bed alarm set Nurse Communication: Mobility  status PT Visit Diagnosis: History of falling (Z91.81);Muscle weakness (generalized) (M62.81);Unsteadiness on feet (R26.81);Difficulty in walking, not elsewhere classified (R26.2)     Time: 1610-9604 PT Time Calculation (min) (ACUTE ONLY): 15 min  Charges:    $Therapeutic Exercise: 8-22 mins PT General Charges $$ ACUTE PT VISIT: 1 Visit                       Greggory Stallion 08/04/2023, 12:58 PM

## 2023-08-04 NOTE — Progress Notes (Signed)
PROGRESS NOTE  Richard Andrews    DOB: August 30, 1960, 63 y.o.  YQM:[id number]    Code Status: Limited: Do not attempt resuscitation (DNR) -DNR-LIMITED -Do Not Intubate/DNI    DOA: 07/31/2023   LOS: 3   Brief hospital course  Richard Andrews is a 64 y.o. 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  Patient describes several falls and frequent bleeding from these injuries.  ER course: tachycardic, severely elevated lactic acid level.  However there are no signs of acute infection, abdomen is nonacute, no signs or symptoms of acute infection. CT of right shoulder showed proper healing of historical humeral fracture without any acute fractures seen.  They were initially treated with Ativan for suspected alcohol withdrawal as well as other supportive care including IV fluids and analgesia.   Patient was admitted to medicine service for further workup and management of unwitnessed fall and encephalopathy as outlined in detail below.  08/04/23 -stable and oriented to self and location. Continues librium taper and ativan PRN. PT recommending SNF. He is agreeable. TOC consulted for placement  Assessment & Plan  Principal Problem:   Lactic acidosis  Unwitnessed fall-unclear etiology, likely multifactorial as patient is chronically weak with malnutrition and presumed long-term alcohol consumption.  Denied loss of consciousness after fall.  No acute fractures seen on CT imaging and shows expected healing of prior humeral fracture however he does have several superficial wounds - Monitor on telemetry -PT consult recommending SNF.  TOC consulted -Wound care   Hypokalemia  critically low  potassium-resolved.  Has history of refeeding syndrome.  K+ 2.2>3.9 Monitor and replete as needed    Alcohol dependence-unknown last drink.  Alcohol level on admission was 378 - Continue thiamine, folate, multivitamin -IV Ativan per CIWA protocol -Continue Librium taper   History of presumed liver cirrhosis-ammonia level 33 on presentation -Continue twice daily lactulose with goal of 2-3 bowel movements per day  Body mass index is 16.91 kg/m.  VTE ppx: SCDs Start: 08/01/23 5284  Diet:     Diet   Diet regular Room service appropriate? Yes; Fluid consistency: Thin   Consultants: None   Subjective 08/04/23    Pt reports doing well today. He states that he lives alone and has a brother in law who can help out but would not be there all the time and unable to help with bathing or bathroom. He's agreeable to SNF.    Objective   Vitals:   08/03/23 1218 08/03/23 1319 08/03/23 1952 08/04/23 0403  BP: 108/76  116/80 130/87  Pulse: (!) 109  86 84  Resp: 16  18 18   Temp: 99.4 F (37.4 C) 98.1 F (36.7 C) 98.6 F (37 C) 98.2 F (36.8 C)  TempSrc: Oral Oral Oral Oral  SpO2: 100%  100% 99%  Weight:      Height:        Intake/Output Summary (Last 24 hours) at 08/04/2023 0740 Last data filed at 08/03/2023 1846 Gross per 24 hour  Intake 960 ml  Output 750 ml  Net 210 ml   Filed Weights   08/01/23 2002  Weight: 55 kg    Physical Exam:  General: awake, alert, NAD HEENT: atraumatic, clear conjunctiva, anicteric sclera, MMM, hearing grossly  normal Respiratory: normal respiratory effort. Cardiovascular: quick capillary refill, normal S1/S2, RRR, no JVD, murmurs Gastrointestinal: soft, NT, ND Nervous: A&O x3. no gross focal neurologic deficits, normal speech Extremities: moves all equally, no edema, normal tone Skin: Ecchymosis as well as abrasions diffusely on his extremities.  Mild deformity of right elbow which is not tender to palpation.  He has full range of motion of his  extremities and no perceivable tremor at this time.  Mildly saturated bandage on right shoulder. Psychiatry: normal mood, congruent affect  Labs   I have personally reviewed the following labs and imaging studies CBC    Component Value Date/Time   WBC 3.8 (L) 08/02/2023 0526   RBC 2.73 (L) 08/02/2023 0526   HGB 9.1 (L) 08/02/2023 1004   HGB 10.7 (L) 05/25/2023 1044   HCT 26.5 (L) 08/02/2023 1004   HCT 31.5 (L) 05/25/2023 1044   PLT 42 (L) 08/02/2023 0526   PLT 56 (LL) 05/25/2023 1044   MCV 102.2 (H) 08/02/2023 0526   MCV 100 (H) 05/25/2023 1044   MCH 33.3 08/02/2023 0526   MCHC 32.6 08/02/2023 0526   RDW 14.7 08/02/2023 0526   RDW 14.2 05/25/2023 1044   LYMPHSABS 2.2 07/31/2023 2123   LYMPHSABS 0.7 05/25/2023 1044   MONOABS 0.9 07/31/2023 2123   EOSABS 0.1 07/31/2023 2123   EOSABS 0.1 05/25/2023 1044   BASOSABS 0.1 07/31/2023 2123   BASOSABS 0.0 05/25/2023 1044      Latest Ref Rng & Units 08/03/2023    5:06 AM 08/02/2023    5:26 AM 07/31/2023    9:23 PM  BMP  Glucose 70 - 99 mg/dL 87  409  811   BUN 8 - 23 mg/dL 11  6  5    Creatinine 0.61 - 1.24 mg/dL 9.14  7.82  9.56   Sodium 135 - 145 mmol/L 133  132  139   Potassium 3.5 - 5.1 mmol/L 3.9  2.2  3.3   Chloride 98 - 111 mmol/L 99  94  98   CO2 22 - 32 mmol/L 26  30  19    Calcium 8.9 - 10.3 mg/dL 8.3  8.0  8.4     No results found.   Disposition Plan & Communication  Patient status: Inpatient  Admitted From: Home Planned disposition location: Skilled nursing facility Anticipated discharge date: 2/24 pending dispo  Family Communication: None at baseline   Author: Leeroy Bock, DO [facility] 08/04/2023, 7:40 AM   Available by Epic secure chat 7AM-7PM. If 7PM-7AM, please contact night-coverage.  [facility] contact information found on [email or web address].

## 2023-08-05 DIAGNOSIS — E872 Acidosis, unspecified: Secondary | ICD-10-CM | POA: Diagnosis not present

## 2023-08-05 LAB — BASIC METABOLIC PANEL
Anion gap: 7 (ref 5–15)
BUN: 14 mg/dL (ref 8–23)
CO2: 25 mmol/L (ref 22–32)
Calcium: 9 mg/dL (ref 8.9–10.3)
Chloride: 100 mmol/L (ref 98–111)
Creatinine, Ser: 0.7 mg/dL (ref 0.61–1.24)
GFR, Estimated: >60 mL/min (ref >60)
Glucose, Bld: 99 mg/dL (ref 70–99)
Potassium: 3.7 mmol/L (ref 3.5–5.1)
Sodium: 132 mmol/L — ABNORMAL LOW (ref 135–145)

## 2023-08-05 LAB — CBC
HCT: 27.2 % — ABNORMAL LOW (ref 39.0–52.0)
Hemoglobin: 8.9 g/dL — ABNORMAL LOW (ref 13.0–17.0)
MCH: 34 pg (ref 26.0–34.0)
MCHC: 32.7 g/dL (ref 30.0–36.0)
MCV: 103.8 fL — ABNORMAL HIGH (ref 80.0–100.0)
Platelets: 61 10*3/uL — ABNORMAL LOW (ref 150–400)
RBC: 2.62 MIL/uL — ABNORMAL LOW (ref 4.22–5.81)
RDW: 14.6 % (ref 11.5–15.5)
WBC: 3.5 10*3/uL — ABNORMAL LOW (ref 4.0–10.5)
nRBC: 0 % (ref 0.0–0.2)

## 2023-08-05 LAB — VITAMIN B1: Vitamin B1 (Thiamine): 68.5 nmol/L (ref 66.5–200.0)

## 2023-08-05 LAB — MAGNESIUM: Magnesium: 1.6 mg/dL — ABNORMAL LOW (ref 1.7–2.4)

## 2023-08-05 LAB — PHOSPHORUS: Phosphorus: 1.1 mg/dL — ABNORMAL LOW (ref 2.5–4.6)

## 2023-08-05 MED ORDER — SODIUM PHOSPHATES 45 MMOLE/15ML IV SOLN
30.0000 mmol | Freq: Once | INTRAVENOUS | Status: AC
  Administered 2023-08-05: 30 mmol via INTRAVENOUS
  Filled 2023-08-05: qty 10

## 2023-08-05 MED ORDER — MAGNESIUM SULFATE 2 GM/50ML IV SOLN
2.0000 g | Freq: Once | INTRAVENOUS | Status: AC
Start: 2023-08-05 — End: 2023-08-05
  Administered 2023-08-05: 2 g via INTRAVENOUS
  Filled 2023-08-05: qty 50

## 2023-08-05 NOTE — Progress Notes (Addendum)
 Progress Note    Richard Andrews  ZOX:[id number] DOB: 1961-03-18  DOA: 07/31/2023 PCP: [name], NP      Brief Narrative:    Medical records reviewed and are as summarized below:  Richard Andrews is a 63 y.o. male medical history significant for severe alcohol use disorder, history of complicated withdrawals, acute traumatic subdural hematoma, presumed liver cirrhosis with multiple recent hospital admissions who is now being admitted to the hospital with lactic acidosis after an unwitnessed fall and down for unknown amount of time followed by confusion upon discovery.   He reported several falls and frequent bleeding from these injuries.  He was treated with Librium and IV Ativan for suspected alcohol withdrawal.  He was also treated with IV fluids.    Assessment/Plan:   Principal Problem:   Lactic acidosis Active Problems:   Hypomagnesemia   Hypophosphatemia   Nutrition Problem: Increased nutrient needs Etiology: chronic illness (alcohol abuse)  Signs/Symptoms: estimated needs   Body mass index is 16.91 kg/m.   Lactic acidosis: Resolved.  Etiology unclear.   Unwitnessed fall-unclear etiology, likely multifactorial as patient is chronically weak with malnutrition and presumed long-term alcohol consumption.  Denied loss of consciousness after fall.  No acute fractures seen on CT imaging and shows expected healing of prior humeral fracture however he does have several superficial wounds Continue local wound care PT recommended discharge to SNF   Hypokalemia: Improved.  Continue potassium repletion. Severe hypophosphatemia: Phosphorus level 1.1.  Replete phosphorus with IV sodium phosphate and monitor levels. Hypomagnesemia: Magnesium 1.6.  Replete magnesium with IV magnesium sulfate and monitor levels.   Alcohol use disorder: Alcohol level on admission was 378 - Continue thiamine, folate, multivitamin -IV Ativan per CIWA protocol -Continue Librium taper     Fatty liver with presumed liver cirrhosis-ammonia level 33 on presentation -Continue twice daily lactulose with goal of 2-3 bowel movements per day   Pancytopenia: This is probably from liver disease or alcohol use Hemoglobin is downtrending but no indication for blood transfusion at this time. WBC down from 6.2 just 3.5.  Platelet up from 42-61. Monitor CBC.      Diet Order             Diet regular Room service appropriate? Yes; Fluid consistency: Thin  Diet effective now                            Consultants: None  Procedures: None    Medications:    chlordiazePOXIDE  25 mg Oral Daily   feeding supplement  237 mL Oral TID BM   folic acid  1 mg Oral Daily   lactulose  20 g Oral BID   multivitamin with minerals  1 tablet Oral Daily   pantoprazole  40 mg Oral Daily   thiamine (VITAMIN B1) injection  100 mg Intravenous Daily   Or   thiamine  100 mg Oral Daily   Continuous Infusions:  magnesium sulfate bolus IVPB     sodium PHOSPHATE IVPB (in mmol)       Anti-infectives (From admission, onward)    None              Family Communication/Anticipated D/C date and plan/Code Status   DVT prophylaxis: SCDs Start: 08/01/23 0821     Code Status: Limited: Do not attempt resuscitation (DNR) -DNR-LIMITED -Do Not Intubate/DNI   Family Communication: None Disposition Plan: Plan to discharge to SNF  Status is: Inpatient Remains inpatient appropriate because: Electrolyte abnormalities, awaiting placement to SNF       Subjective:   Interval events noted.  He has no complaints.  Objective:    Vitals:   08/04/23 2115 08/05/23 0438 08/05/23 0912 08/05/23 1210  BP: 114/78 117/82 118/89 (!) 110/90  Pulse: 72 87 86 (!) 110  Resp: 18 18 18 16   Temp: 98.5 F (36.9 C) 98.8 F (37.1 C) 99.1 F (37.3 C) 98.8 F (37.1 C)  TempSrc: Oral Oral Oral Oral  SpO2: 98% 99% 100% 100%  Weight:      Height:       No data  found.   Intake/Output Summary (Last 24 hours) at 08/05/2023 1547 Last data filed at 08/05/2023 1318 Gross per 24 hour  Intake 737 ml  Output 175 ml  Net 562 ml   Filed Weights   08/01/23 2002  Weight: 55 kg    Exam:  GEN: NAD SKIN: Warm and dry.  Multiple bruises on bilateral upper extremities EYES: No pallor or icterus ENT: MMM CV: RRR PULM: CTA B ABD: soft, ND, NT, +BS CNS: AAO x 2 (person and place), non focal EXT: No edema or tenderness        Data Reviewed:   I have personally reviewed following labs and imaging studies:  Labs: Labs show the following:   Basic Metabolic Panel: Recent Labs  Lab 07/31/23 2123 08/02/23 0526 08/03/23 0506 08/05/23 0810  NA 139 132* 133* 132*  K 3.3* 2.2* 3.9 3.7  CL 98 94* 99 100  CO2 19* 30 26 25   GLUCOSE 131* 100* 87 99  BUN 5* 6* 11 14  CREATININE 0.66 0.58* 0.80 0.70  CALCIUM 8.4* 8.0* 8.3* 9.0  MG 1.4* 1.5*  --  1.6*  PHOS 3.2  --   --  1.1*   GFR Estimated Creatinine Clearance: 73.5 mL/min (by C-G formula based on SCr of 0.7 mg/dL). Liver Function Tests: Recent Labs  Lab 07/31/23 2123 08/02/23 0526  AST 51* 38  ALT 18 14  ALKPHOS 89 79  BILITOT 1.6* 2.1*  PROT 7.5 6.4*  ALBUMIN 3.6 3.1*   Recent Labs  Lab 07/31/23 2123  LIPASE 52*   Recent Labs  Lab 08/01/23 0040  AMMONIA 33   Coagulation profile No results for input(s): "INR", "PROTIME" in the last 168 hours.  CBC: Recent Labs  Lab 07/31/23 2123 08/01/23 1021 08/01/23 2051 08/02/23 0526 08/02/23 1004 08/05/23 0810  WBC 6.2  --   --  3.8*  --  3.5*  NEUTROABS 2.8  --   --   --   --   --   HGB 12.2* 9.3* 9.9* 9.1* 9.1* 8.9*  HCT 35.7* 27.9* 30.4* 27.9* 26.5* 27.2*  MCV 101.4*  --   --  102.2*  --  103.8*  PLT 94*  --   --  42*  --  61*   Cardiac Enzymes: Recent Labs  Lab 07/31/23 2123  CKTOTAL 87   BNP (last 3 results) No results for input(s): "PROBNP" in the last 8760 hours. CBG: No results for input(s): "GLUCAP" in the  last 168 hours. D-Dimer: No results for input(s): "DDIMER" in the last 72 hours. Hgb A1c: No results for input(s): "HGBA1C" in the last 72 hours. Lipid Profile: No results for input(s): "CHOL", "HDL", "LDLCALC", "TRIG", "CHOLHDL", "LDLDIRECT" in the last 72 hours. Thyroid function studies: No results for input(s): "TSH", "T4TOTAL", "T3FREE", "THYROIDAB" in the last 72 hours.  Invalid input(s): "FREET3"  Anemia work up: No results for input(s): "VITAMINB12", "FOLATE", "FERRITIN", "TIBC", "IRON", "RETICCTPCT" in the last 72 hours. Sepsis Labs: Recent Labs  Lab 07/31/23 2123 07/31/23 2353 08/01/23 0052 08/01/23 0223 08/01/23 1630 08/01/23 2051 08/02/23 0526 08/05/23 0810  WBC 6.2  --   --   --   --   --  3.8* 3.5*  LATICACIDVEN  --    < > 8.6* 8.1* 2.9* 1.8  --   --    < > = values in this interval not displayed.    Microbiology Recent Results (from the past 240 hours)  Resp panel by RT-PCR (RSV, Flu A&B, Covid) Anterior Nasal Swab     Status: None   Collection Time: 07/31/23  9:32 PM   Specimen: Anterior Nasal Swab  Result Value Ref Range Status   SARS Coronavirus 2 by RT PCR NEGATIVE NEGATIVE Final    Comment: (NOTE) SARS-CoV-2 target nucleic acids are NOT DETECTED.  The SARS-CoV-2 RNA is generally detectable in upper respiratory specimens during the acute phase of infection. The lowest concentration of SARS-CoV-2 viral copies this assay can detect is 138 copies/mL. A negative result does not preclude SARS-Cov-2 infection and should not be used as the sole basis for treatment or other patient management decisions. A negative result may occur with  improper specimen collection/handling, submission of specimen other than nasopharyngeal swab, presence of viral mutation(s) within the areas targeted by this assay, and inadequate number of viral copies(<138 copies/mL). A negative result must be combined with clinical observations, patient history, and  epidemiological information. The expected result is Negative.  Fact Sheet for Patients:  BloggerCourse.com  Fact Sheet for Healthcare Providers:  SeriousBroker.it  This test is no t yet approved or cleared by the [place] FDA and  has been authorized for detection and/or diagnosis of SARS-CoV-2 by FDA under an Emergency Use Authorization (EUA). This EUA will remain  in effect (meaning this test can be used) for the duration of the COVID-19 declaration under Section 564(b)(1) of the Act, 21 U.S.C.section 360bbb-3(b)(1), unless the authorization is terminated  or revoked sooner.       Influenza A by PCR NEGATIVE NEGATIVE Final   Influenza B by PCR NEGATIVE NEGATIVE Final    Comment: (NOTE) The Xpert Xpress SARS-CoV-2/FLU/RSV plus assay is intended as an aid in the diagnosis of influenza from Nasopharyngeal swab specimens and should not be used as a sole basis for treatment. Nasal washings and aspirates are unacceptable for Xpert Xpress SARS-CoV-2/FLU/RSV testing.  Fact Sheet for Patients: BloggerCourse.com  Fact Sheet for Healthcare Providers: SeriousBroker.it  This test is not yet approved or cleared by the [place] FDA and has been authorized for detection and/or diagnosis of SARS-CoV-2 by FDA under an Emergency Use Authorization (EUA). This EUA will remain in effect (meaning this test can be used) for the duration of the COVID-19 declaration under Section 564(b)(1) of the Act, 21 U.S.C. section 360bbb-3(b)(1), unless the authorization is terminated or revoked.     Resp Syncytial Virus by PCR NEGATIVE NEGATIVE Final    Comment: (NOTE) Fact Sheet for Patients: BloggerCourse.com  Fact Sheet for Healthcare Providers: SeriousBroker.it  This test is not yet approved or cleared by the [place] FDA and has been  authorized for detection and/or diagnosis of SARS-CoV-2 by FDA under an Emergency Use Authorization (EUA). This EUA will remain in effect (meaning this test can be used) for the duration of the COVID-19 declaration under Section 564(b)(1) of the Act, 21 U.S.C. section 360bbb-3(b)(1),  unless the authorization is terminated or revoked.  Performed at [facility], [address] [place], [name] [address]     Procedures and diagnostic studies:  No results found.             LOS: 4 days   Herschell Virani  [facility]   Pager on [email or web address]. If 7PM-7AM, please contact night-coverage at [email or web address]     08/05/2023, 3:47 PM

## 2023-08-05 NOTE — Plan of Care (Signed)
  Problem: Clinical Measurements: Goal: Ability to maintain clinical measurements within normal limits will improve Outcome: Progressing   Problem: Activity: Goal: Risk for activity intolerance will decrease Outcome: Progressing   Problem: Nutrition: Goal: Adequate nutrition will be maintained Outcome: Progressing   Problem: Safety: Goal: Ability to remain free from injury will improve Outcome: Progressing   

## 2023-08-05 NOTE — Plan of Care (Signed)
   Problem: Clinical Measurements: Goal: Ability to maintain clinical measurements within normal limits will improve Outcome: Progressing Goal: Will remain free from infection Outcome: Progressing   Problem: Coping: Goal: Level of anxiety will decrease Outcome: Progressing

## 2023-08-05 NOTE — Progress Notes (Signed)
 Mobility Specialist - Progress Note   08/05/23 1053  Mobility  Activity Ambulated with assistance in room  Level of Assistance Minimal assist, patient does 75% or more  Assistive Device Front wheel walker  Distance Ambulated (ft) 12 ft  Range of Motion/Exercises Active  Activity Response Tolerated well  Mobility Referral Yes  Mobility visit 1 Mobility  Mobility Specialist Start Time (ACUTE ONLY) 1043  Mobility Specialist Stop Time (ACUTE ONLY) 1053  Mobility Specialist Time Calculation (min) (ACUTE ONLY) 10 min   Pt was found in bed and agreeable to mobilize. Elevated bed for STS. Pt able to ambulate to door and back to recliner chair. At EOS was left on recliner chair with all needs met. Call bell in reach and chair alarm on.  Billey Chang Mobility Specialist

## 2023-08-06 DIAGNOSIS — E872 Acidosis, unspecified: Secondary | ICD-10-CM | POA: Diagnosis not present

## 2023-08-06 LAB — CBC
HCT: 26.6 % — ABNORMAL LOW (ref 39.0–52.0)
Hemoglobin: 8.4 g/dL — ABNORMAL LOW (ref 13.0–17.0)
MCH: 34 pg (ref 26.0–34.0)
MCHC: 31.6 g/dL (ref 30.0–36.0)
MCV: 107.7 fL — ABNORMAL HIGH (ref 80.0–100.0)
Platelets: 67 10*3/uL — ABNORMAL LOW (ref 150–400)
RBC: 2.47 MIL/uL — ABNORMAL LOW (ref 4.22–5.81)
RDW: 15.1 % (ref 11.5–15.5)
WBC: 2.3 10*3/uL — ABNORMAL LOW (ref 4.0–10.5)
nRBC: 0 % (ref 0.0–0.2)

## 2023-08-06 LAB — BASIC METABOLIC PANEL
Anion gap: 8 (ref 5–15)
BUN: 15 mg/dL (ref 8–23)
CO2: 24 mmol/L (ref 22–32)
Calcium: 8.7 mg/dL — ABNORMAL LOW (ref 8.9–10.3)
Chloride: 101 mmol/L (ref 98–111)
Creatinine, Ser: 0.74 mg/dL (ref 0.61–1.24)
GFR, Estimated: >60 mL/min (ref >60)
Glucose, Bld: 92 mg/dL (ref 70–99)
Potassium: 3.3 mmol/L — ABNORMAL LOW (ref 3.5–5.1)
Sodium: 133 mmol/L — ABNORMAL LOW (ref 135–145)

## 2023-08-06 LAB — MAGNESIUM: Magnesium: 1.8 mg/dL (ref 1.7–2.4)

## 2023-08-06 LAB — PHOSPHORUS: Phosphorus: 3.9 mg/dL (ref 2.5–4.6)

## 2023-08-06 MED ORDER — POTASSIUM CHLORIDE CRYS ER 20 MEQ PO TBCR
20.0000 meq | EXTENDED_RELEASE_TABLET | Freq: Every day | ORAL | Status: DC
  Administered 2023-08-06 – 2023-08-10 (×5): 20 meq via ORAL
  Filled 2023-08-06 (×5): qty 1

## 2023-08-06 NOTE — Plan of Care (Signed)

## 2023-08-06 NOTE — Progress Notes (Signed)
  Progress Note   Patient: Richard Andrews [id number] DOB: [birth year] DOA: 07/31/2023     5 DOS: the patient was seen and examined on 08/06/2023 at 9:22AM      Brief hospital course: 63 y.o. 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     Assessment and Plan: Alcoholism Severe protein calorie malnutrition Generalized weakness due to alcoholism, starvation, electrolyte abnormalities Hypokalemia Hypomagnesemia No evidence of withdrawal here.  Completed Librium taper.   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  Acute confusion at admission was simply alcohol intoxication superimposed on chronic cognitive impairment due to alcohol.  No acute encephalopathy noted. - Continue thiamine, folate - Continue lactulose  Pancytopenia Counts stable, due to alcoholism  Chronic subdural hematoma CT head on admission 2/17 showed no change  Hyponatremia Stable        Subjective: No new findings, no nursing concerns.     Physical Exam: BP 120/78 (BP Location: Right Arm)   Pulse 64   Temp 98.1 F (36.7 C) (Oral)   Resp 18   Ht 5\' 11"  (1.803 m)   Wt 55 kg   SpO2 100%   BMI 16.91 kg/m   Thin adult male, lying in bed Cor exam normal Lung exam unremarkable Appears malnourished, cognitively impaired and with slowed responses but without stigmata of HE.     Data Reviewed: Phos and mag resolved K 3.3 Pancytopenia unchanged   Family Communication: None    Disposition: Status is: Inpatient Patient is unsafe to dicharge alone, does not appear cognitively able to care for  self.        Author: Alberteen Sam, MD 08/06/2023 2:11 PM  For on call review [email or web address].

## 2023-08-06 NOTE — Progress Notes (Signed)
 Mobility Specialist - Progress Note   08/06/23 1019  Mobility  Activity Ambulated with assistance in hallway  Level of Assistance Minimal assist, patient does 75% or more  Assistive Device Front wheel walker  Distance Ambulated (ft) 100 ft  Range of Motion/Exercises Active  Activity Response Tolerated well  Mobility Referral Yes  Mobility visit 1 Mobility  Mobility Specialist Start Time (ACUTE ONLY) 1000  Mobility Specialist Stop Time (ACUTE ONLY) 1019  Mobility Specialist Time Calculation (min) (ACUTE ONLY) 19 min   Pt was found in bed and agreeable to ambulate. Stated having BLE weakness. At EOS returned to recliner chair with all needs met. Call bell in reach.  Billey Chang Mobility Specialist

## 2023-08-06 NOTE — Hospital Course (Signed)
 63 y.o. 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   Assessment and Plan: Alcoholism Severe protein calorie malnutrition Generalized weakness due to alcoholism: Starvation, electrolyte abnormality Hypokalemia Hypomagnesemia Patient was admitted and started on Librium taper, IV fluids, electrolyte supplementation.  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  He has had no acute encephalopathy while here. - Continue thiamine, folate, lactulose   Pancytopenia Counts stable, due to alcoholism   Chronic subdural hematoma CT head on admission 2/17 showed no change   Hyponatremia Stable               Subjective: Findings, no new nursing concerns         Physical Exam:  Male, sitting up in bed RRR, no murmurs, no peripheral edema Respiratory rate normal, lungs clear without rales or wheezes Appears malnourished, he appears to have some cognitive slowing, he has no asterixis, face is symmetric, speech is somewhat slurred but at baseline, generalized weakness is noted       Data Reviewed:       Family Communication:         Disposition: Status is: Inpatient The patient was admitted with alcohol intoxication and a fall.  This is resolved, but he is unsafe to discharge home, and placement is pending.

## 2023-08-07 DIAGNOSIS — E872 Acidosis, unspecified: Secondary | ICD-10-CM | POA: Diagnosis not present

## 2023-08-07 NOTE — Plan of Care (Signed)

## 2023-08-07 NOTE — Progress Notes (Signed)
  Progress Note   Patient: Richard Andrews ZOX:[id number] DOB: 28-Jun-1960 DOA: 07/31/2023     6 DOS: the patient was seen and examined on 08/07/2023 at 11:31AM      Brief hospital course: 63 y.o. 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   Assessment and Plan: Alcoholism Severe protein calorie malnutrition Generalized weakness due to alcoholism: Starvation, electrolyte abnormality Hypokalemia Hypomagnesemia Patient was admitted and started on Librium taper, IV fluids, electrolyte supplementation.  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  He has had no acute encephalopathy while here. - Continue thiamine, folate, lactulose   Pancytopenia Counts stable, due to alcoholism   Chronic subdural hematoma CT head on admission 2/17 showed no change   Hyponatremia Stable               Subjective: Findings, no new nursing concerns         Physical Exam: BP 112/74 (BP Location: Left Arm)   Pulse 78   Temp 98.9 F (37.2 C) (Oral)   Resp 19   Ht 5\' 11"  (1.803 m)   Wt 55 kg   SpO2 100%   BMI 16.91 kg/m   Adult male, sitting up in bed RRR, no murmurs, no peripheral edema Respiratory rate normal, lungs clear without rales or wheezes Appears malnourished, he appears to have some cognitive slowing, he has no asterixis, face is symmetric, speech is somewhat slurred but at baseline, generalized weakness is noted       Data Reviewed:       Family Communication:         Disposition: Status is:  Inpatient The patient was admitted with alcohol intoxication and a fall.  This is resolved, but he is unsafe to discharge home, and placement is pending.          Author: Alberteen Sam, MD 08/07/2023 3:16 PM  For on call review [email or web address].

## 2023-08-07 NOTE — TOC Progression Note (Addendum)
 Transition of Care [facility]) - Progression Note    Patient Details  Name: Richard Andrews MRN: [id number] Date of Birth: 28-Mar-1961  [facility] of [facility]) CM/SW Contact  Monroe Toure, Olegario Messier, RN Phone Number: 08/07/2023, 2:22 PM  Clinical Narrative:  Patient chose  [facility] rep Kia aware-she will start auth for [employer] commercial-await auth.     Expected Discharge Plan: Skilled Nursing Facility Barriers to Discharge: Insurance Authorization  Expected Discharge Plan and Services                                               Social Determinants of Health (SDOH) Interventions SDOH Screenings   Food Insecurity: Food Insecurity Present (08/01/2023)  Housing: Low Risk  (08/01/2023)  Transportation Needs: No Transportation Needs (08/01/2023)  Utilities: Not At Risk (08/01/2023)  Depression (PHQ2-9): Low Risk  (03/07/2023)  Social Connections: Unknown (08/01/2023)  Recent Concern: Social Connections - Socially Isolated (07/06/2023)  Tobacco Use: High Risk (08/01/2023)    Readmission Risk Interventions    07/11/2023    4:08 PM  Readmission Risk Prevention Plan  Transportation Screening Complete  PCP or Specialist Appt within 3-5 Days Complete  HRI or Home Care Consult Complete  Social Work Consult for Recovery Care Planning/Counseling Complete  Palliative Care Screening Not Applicable  Medication Review Oceanographer) Complete
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
## 2023-08-08 DIAGNOSIS — E872 Acidosis, unspecified: Secondary | ICD-10-CM | POA: Diagnosis not present

## 2023-08-08 NOTE — Progress Notes (Signed)
 Occupational Therapy Treatment Patient Details Name: Richard Andrews MRN: [id number] DOB: 1960-07-31 Today's Date: 08/08/2023   History of present illness 63 yo male admitted with lactic acidosis after falling at home. Hx of ETOH abuse, complicated withdrawals, SDH, Cdiff, cirrhosis, rib fxs   OT comments  Pt progressing towrad OT goals in Acute setting and is at an overall min a level with cues for safety and cognition for BADL's, transfers and basic mobility in hospital room. OT recommending pt to be seen while in Acute setting. Patient will benefit from continued inpatient follow up therapy, <3 hours/day.        If plan is discharge home, recommend the following:  Assistance with cooking/housework;Assist for transportation;Supervision due to cognitive status;Help with stairs or ramp for entrance;A little help with walking and/or transfers;A little help with bathing/dressing/bathroom;Direct supervision/assist for medications management   Equipment Recommendations  BSC/3in1;Other (comment) (RW)       Precautions / Restrictions Precautions Precautions: Fall Restrictions Weight Bearing Restrictions Per Provider Order: No       Mobility Bed Mobility Overal bed mobility: Needs Assistance Bed Mobility: Rolling, Supine to Sit Rolling: Supervision Sidelying to sit: Contact guard assist Supine to sit: Contact guard          Transfers Overall transfer level: Needs assistance Equipment used: Rolling walker (2 wheels) Transfers: Sit to/from Stand, Bed to chair/wheelchair/BSC Sit to Stand: Min assist           General transfer comment: amb to and from bathroom from bed then to recliner ~ 15 ft x 2 with RW with min A and cues     Balance Overall balance assessment: Needs assistance Sitting-balance support: Feet supported Sitting balance-Leahy Scale: Fair     Standing balance support: Bilateral upper extremity supported, Single extremity supported, During functional  activity Standing balance-Leahy Scale: Poor                             ADL either performed or assessed with clinical judgement   ADL Overall ADL's : Needs assistance/impaired Eating/Feeding: Set up   Grooming: Wash/dry hands;Oral care;Brushing hair;Standing;Contact guard assist           Upper Body Dressing : Contact guard assist;Sitting   Lower Body Dressing: Minimal assistance;Sitting/lateral leans;Sit to/from stand               Functional mobility during ADLs: Minimal assistance;Cueing for safety;Cueing for sequencing;Rolling walker (2 wheels) General ADL Comments: decreased overall posture, strength and activity tolerance with balance deficits limits standing and LB ADL's    Extremity/Trunk Assessment Upper Extremity Assessment Upper Extremity Assessment: Generalized weakness                     Communication Communication Communication: No apparent difficulties   Cognition Arousal: Lethargic (improved to alert after activity and stimulation) Behavior During Therapy: Flat affect Cognition: Cognition impaired   Orientation impairments: Person, Place (person, place and year only) Awareness: Online awareness impaired Memory impairment (select all impairments): Short-term memory, Working memory Attention impairment (select first level of impairment): Selective attention Executive functioning impairment (select all impairments): Initiation, Organization, Sequencing, Problem solving, Reasoning OT - Cognition Comments: pt administered the SBT this session. Scored a 7 with ranges as follows (0-4) normal, (5-9) questionable impairment and (10 or more) Impairment consistent with need for further assessment. (pt's areas of concern in temporal orientation and memory with 3/5 recalled indicating issues with STM and working LTM)  Following commands: Intact                 General Comments multiple bruises and abrasions     Pertinent Vitals/ Pain       Pain Assessment Pain Assessment: Faces Pain Score: 2  Faces Pain Scale: Hurts a little bit Pain Location: "bumped around all over" Pain Descriptors / Indicators:  (unable to describe) Pain Intervention(s): Monitored during session, Repositioned   Frequency  Min 1X/week        Progress Toward Goals  OT Goals(current goals can now be found in the care plan section)  Progress towards OT goals: Progressing toward goals  Acute Rehab OT Goals Patient Stated Goal: to get better OT Goal Formulation: With patient Time For Goal Achievement: 08/17/23 Potential to Achieve Goals: Fair ADL Goals Pt Will Perform Grooming: with contact guard assist;standing Pt Will Perform Upper Body Dressing: with set-up;with supervision;sitting Pt Will Perform Lower Body Dressing: with contact guard assist;sitting/lateral leans;sit to/from stand;with adaptive equipment Pt Will Transfer to Toilet: ambulating;with contact guard assist Pt Will Perform Toileting - Clothing Manipulation and hygiene: with supervision;sitting/lateral leans;sit to/from stand Pt/caregiver will Perform Home Exercise Program: Increased ROM;Increased strength;Both right and left upper extremity;With Supervision Additional ADL Goal #1: Pt will improve sitting balance from Zero to fair and tolerate at least 20 min at EOB for light ADLs with use of BUEs such as grooming or feeding. Additional ADL Goal #2: Pt will demonstrate improved mentation by scoring <4/10 on short blessed test and answering 4/4 safety questions from the [facility] correctly:   1. What do you do for yourself if you are sick with a cold.    2. What do you do if you burn yourself and the wound becomes infected.    3. What do you do if you experience severe chest pain and shortness of breath?   4. What number do you call in an emergency?  Plan         AM-PAC OT "6 Clicks" Daily Activity     Outcome Measure   Help from another person eating  meals?: None Help from another person taking care of personal grooming?: A Little Help from another person toileting, which includes using toliet, bedpan, or urinal?: A Little Help from another person bathing (including washing, rinsing, drying)?: A Little Help from another person to put on and taking off regular upper body clothing?: A Little Help from another person to put on and taking off regular lower body clothing?: A Little 6 Click Score: 19    End of Session Equipment Utilized During Treatment: Gait belt;Rolling walker (2 wheels)  OT Visit Diagnosis: Unsteadiness on feet (R26.81);Dizziness and giddiness (R42);Other abnormalities of gait and mobility (R26.89);Pain;History of falling (Z91.81);Other symptoms and signs involving cognitive function;Ataxia, unspecified (R27.0)   Activity Tolerance Patient limited by fatigue   Patient Left in chair;with call bell/phone within reach;with chair alarm set   Nurse Communication Mobility status        Time: 7124-5809 OT Time Calculation (min): 46 min  Charges: OT General Charges $OT Visit: 1 Visit OT Treatments $Self Care/Home Management : 38-52 mins  Caulin Begley OT/L Acute Rehabilitation Department  [phone]   Vicenta Dunning 08/08/2023, 3:29 PM

## 2023-08-08 NOTE — Progress Notes (Signed)
 Mobility Specialist - Progress Note   08/08/23 1400  Mobility  Activity Ambulated with assistance in hallway  Level of Assistance Contact guard assist, steadying assist  Assistive Device Front wheel walker  Distance Ambulated (ft) 160 ft  Range of Motion/Exercises Active  Activity Response Tolerated well  Mobility Referral Yes  Mobility visit 1 Mobility  Mobility Specialist Start Time (ACUTE ONLY) 1246  Mobility Specialist Stop Time (ACUTE ONLY) 1303  Mobility Specialist Time Calculation (min) (ACUTE ONLY) 17 min   Received in bed and agreed to mobility. Min A for bed mobility, contact for ambulating. No issues throughout session, returned to bed with all needs met, alarm on.  Marilynne Halsted Mobility Specialist

## 2023-08-08 NOTE — Progress Notes (Signed)
  Progress Note   Patient: Richard Andrews FAO:[id number] DOB: Jan 01, 1961 DOA: 07/31/2023     7 DOS: the patient was seen and examined on 08/08/2023        Brief hospital course: 63 y.o. 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   Assessment and Plan: Alcoholism Severe protein calorie malnutrition Generalized weakness due to alcoholism: Starvation, electrolyte abnormality Hypokalemia Hypomagnesemia Patient was admitted and started on Librium taper, IV fluids, electrolyte supplementation.  He had no evidence of alcohol withdrawal here, his intoxication resolved, and his electrolytes were supplemented.  He is now stable and ready for discharge. 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  He has had no acute encephalopathy while here. - Continue thiamine, folate, lactulose   Pancytopenia Counts stable, due to alcoholism   Chronic subdural hematoma CT head on admission 2/17 showed no change   Hyponatremia Stable               Subjective: No new complaints, no new nursing concerns         Physical Exam: BP 102/69 (BP Location: Right Arm)   Pulse 87   Temp 99.1 F (37.3 C) (Oral)   Resp 19   Ht 5\' 11"  (1.803 m)   Wt 55 kg   SpO2 98%   BMI 16.91 kg/m   Watching television.   RRR, no murmurs, no peripheral edema Respiratory rate normal, lungs clear without rales or wheezes       Data Reviewed:       Family Communication:         Disposition: Status is: Inpatient The patient was admitted with alcohol intoxication and a fall.  This is  resolved, but he is unsafe to discharge home, and placement is pending.         Author: Alberteen Sam, MD 08/08/2023 3:46 PM  For on call review [email or web address].

## 2023-08-09 DIAGNOSIS — E872 Acidosis, unspecified: Secondary | ICD-10-CM | POA: Diagnosis not present

## 2023-08-09 LAB — COMPREHENSIVE METABOLIC PANEL
ALT: 20 U/L (ref 0–44)
AST: 33 U/L (ref 15–41)
Albumin: 3.1 g/dL — ABNORMAL LOW (ref 3.5–5.0)
Alkaline Phosphatase: 79 U/L (ref 38–126)
Anion gap: 12 (ref 5–15)
BUN: 13 mg/dL (ref 8–23)
CO2: 21 mmol/L — ABNORMAL LOW (ref 22–32)
Calcium: 8.8 mg/dL — ABNORMAL LOW (ref 8.9–10.3)
Chloride: 100 mmol/L (ref 98–111)
Creatinine, Ser: 0.48 mg/dL — ABNORMAL LOW (ref 0.61–1.24)
GFR, Estimated: >60 mL/min (ref >60)
Glucose, Bld: 123 mg/dL — ABNORMAL HIGH (ref 70–99)
Potassium: 3.5 mmol/L (ref 3.5–5.1)
Sodium: 133 mmol/L — ABNORMAL LOW (ref 135–145)
Total Bilirubin: 0.9 mg/dL (ref 0.0–1.2)
Total Protein: 6.7 g/dL (ref 6.5–8.1)

## 2023-08-09 LAB — CBC
HCT: 26.9 % — ABNORMAL LOW (ref 39.0–52.0)
Hemoglobin: 8.6 g/dL — ABNORMAL LOW (ref 13.0–17.0)
MCH: 34.4 pg — ABNORMAL HIGH (ref 26.0–34.0)
MCHC: 32 g/dL (ref 30.0–36.0)
MCV: 107.6 fL — ABNORMAL HIGH (ref 80.0–100.0)
Platelets: 143 10*3/uL — ABNORMAL LOW (ref 150–400)
RBC: 2.5 MIL/uL — ABNORMAL LOW (ref 4.22–5.81)
RDW: 15.5 % (ref 11.5–15.5)
WBC: 2.6 10*3/uL — ABNORMAL LOW (ref 4.0–10.5)
nRBC: 0 % (ref 0.0–0.2)

## 2023-08-09 MED ORDER — OXYCODONE HCL 5 MG PO TABS: 5.0000 mg | ORAL_TABLET | Freq: Four times a day (QID) | ORAL | 0 refills | Status: DC | PRN

## 2023-08-09 MED ORDER — PANTOPRAZOLE SODIUM 40 MG PO TBEC: 40.0000 mg | DELAYED_RELEASE_TABLET | Freq: Every day | ORAL | Status: DC

## 2023-08-09 NOTE — Progress Notes (Signed)
 Physical Therapy Treatment Patient Details Name: HALIL RENTZ MRN: [id number] DOB: 25-May-1961 Today's Date: 08/09/2023   History of Present Illness 63 yo male admitted with lactic acidosis after falling at home. Hx of ETOH abuse, complicated withdrawals, SDH, Cdiff, cirrhosis, rib fxs    PT Comments  Pt admitted with above diagnosis.  Pt currently with functional limitations due to the deficits listed below (see PT Problem List). Pt in bed when PT arrived. Pt agreeable to therapy intervention. Pt reported R side pain, unable to rate and described as sore. Pt is S for bed mobility, CGA for sit to stand  from EOB, CGA for gait tasks and progressing to close S for 180 feet in hallway with RW min cues for safety, posture, proper distances from RW and attention to L side for hazard recognition and avoidance. Pt able to engage with functional balance tasks in bathroom with 1 UE support at RW CGA to close S and no overt LOB.  Pt left seated in recliner, all needs in place. No reports of dizziness or SOB. Pt d/c planning may need to be reassessed pending pt progression with therapy and support in home setting.  Pt will benefit from acute skilled PT to increase their independence and safety with mobility to allow discharge.      If plan is discharge home, recommend the following: A little help with walking and/or transfers;A little help with bathing/dressing/bathroom;Assistance with cooking/housework;Assist for transportation;Help with stairs or ramp for entrance   Can travel by private vehicle     Yes  Equipment Recommendations  None recommended by PT;Other (comment) (Pt has a RW & w/c at home)    Recommendations for Other Services       Precautions / Restrictions Precautions Precautions: Fall Precaution/Restrictions Comments: incontinent (Condom cath in place) Restrictions Weight Bearing Restrictions Per Provider Order: No     Mobility  Bed Mobility Overal bed mobility: Needs  Assistance Bed Mobility: Supine to Sit Rolling: Supervision         General bed mobility comments: Increased time to transition to EOB and min A    Transfers Overall transfer level: Needs assistance Equipment used: Rolling walker (2 wheels) Transfers: Sit to/from Stand, Bed to chair/wheelchair/BSC Sit to Stand: Contact guard assist           General transfer comment: min cues for safety and inital standing balance    Ambulation/Gait Ambulation/Gait assistance: Contact guard assist Gait Distance (Feet): 180 Feet Assistive device: Rolling walker (2 wheels) Gait Pattern/deviations: Step-through pattern, Drifts right/left Gait velocity: decreased     General Gait Details: min cues for safety, posture, proper distance from RW, pt demonstrated slight L lateral drift and collieded with objects on the L side with use of Rw, with cues pt able to navigate around obstacles   Stairs             Wheelchair Mobility     Tilt Bed    Modified Rankin (Stroke Patients Only)       Balance Overall balance assessment: Needs assistance Sitting-balance support: Feet supported Sitting balance-Leahy Scale: Good     Standing balance support: Bilateral upper extremity supported, Single extremity supported, During functional activity Standing balance-Leahy Scale: Poor Standing balance comment: pt requires one UE support for standing ADL tasks with CGA and cues, B UE support for gait                            Communication  Communication Communication: Impaired Factors Affecting Communication: Difficulty expressing self  Cognition Arousal: Alert Behavior During Therapy: Flat affect   PT - Cognitive impairments: Orientation, Awareness, Memory   Orientation impairments: Time, Situation                     Following commands: Intact      Cueing Cueing Techniques: Verbal cues  Exercises      General Comments General comments (skin integrity, edema,  etc.): multiple bruises      Pertinent Vitals/Pain Pain Assessment Pain Assessment: Faces Faces Pain Scale: Hurts a little bit Pain Location: R side Pain Descriptors / Indicators: Sore Pain Intervention(s): Limited activity within patient's tolerance, Monitored during session    Home Living                          Prior Function            PT Goals (current goals can now be found in the care plan section) Acute Rehab PT Goals Patient Stated Goal: return home PT Goal Formulation: With patient Time For Goal Achievement: 08/16/23 Potential to Achieve Goals: Fair Progress towards PT goals: Progressing toward goals    Frequency    Min 1X/week      PT Plan      Co-evaluation              AM-PAC PT "6 Clicks" Mobility   Outcome Measure  Help needed turning from your back to your side while in a flat bed without using bedrails?: None Help needed moving from lying on your back to sitting on the side of a flat bed without using bedrails?: None Help needed moving to and from a bed to a chair (including a wheelchair)?: A Little Help needed standing up from a chair using your arms (e.g., wheelchair or bedside chair)?: A Little Help needed to walk in hospital room?: A Little Help needed climbing 3-5 steps with a railing? : A Lot 6 Click Score: 19    End of Session Equipment Utilized During Treatment: Gait belt Activity Tolerance: Patient tolerated treatment well Patient left: in chair;with call bell/phone within reach Nurse Communication: Mobility status PT Visit Diagnosis: History of falling (Z91.81);Muscle weakness (generalized) (M62.81);Unsteadiness on feet (R26.81);Difficulty in walking, not elsewhere classified (R26.2)     Time: 1610-9604 PT Time Calculation (min) (ACUTE ONLY): 29 min  Charges:    $Gait Training: 8-22 mins $Therapeutic Activity: 8-22 mins PT General Charges $$ ACUTE PT VISIT: 1 Visit                     Johnny Bridge, PT Acute  Rehab    Jacqualyn Posey 08/09/2023, 1:11 PM

## 2023-08-09 NOTE — Plan of Care (Signed)
  Problem: Nutrition: Goal: Adequate nutrition will be maintained Outcome: Progressing   Problem: Coping: Goal: Level of anxiety will decrease Outcome: Progressing   Problem: Education: Goal: Knowledge of General Education information will improve Description: Including pain rating scale, medication(s)/side effects and non-pharmacologic comfort measures Outcome: Not Progressing   Problem: Health Behavior/Discharge Planning: Goal: Ability to manage health-related needs will improve Outcome: Not Progressing   Problem: Activity: Goal: Risk for activity intolerance will decrease Outcome: Not Progressing

## 2023-08-09 NOTE — TOC Progression Note (Signed)
 Transition of Care [facility]) - Progression Note    Patient Details  Name: Richard Andrews MRN: [id number] Date of Birth: 25-Jun-1960  [facility] CM/SW Contact  Amada Jupiter, LCSW Phone Number: 08/09/2023, 3:21 PM  Clinical Narrative:    Per [employer], insurance authorization still pending.  MD aware.   Expected Discharge Plan: Skilled Nursing Facility Barriers to Discharge: Insurance Authorization  Expected Discharge Plan and Services         Expected Discharge Date: 08/09/23                                     Social Determinants of Health (SDOH) Interventions SDOH Screenings   Food Insecurity: Food Insecurity Present (08/01/2023)  Housing: Low Risk  (08/01/2023)  Transportation Needs: No Transportation Needs (08/01/2023)  Utilities: Not At Risk (08/01/2023)  Depression (PHQ2-9): Low Risk  (03/07/2023)  Social Connections: Unknown (08/01/2023)  Recent Concern: Social Connections - Socially Isolated (07/06/2023)  Tobacco Use: High Risk (08/01/2023)    Readmission Risk Interventions    07/11/2023    4:08 PM  Readmission Risk Prevention Plan  Transportation Screening Complete  PCP or Specialist Appt within 3-5 Days Complete  HRI or Home Care Consult Complete  Social Work Consult for Recovery Care Planning/Counseling Complete  Palliative Care Screening Not Applicable  Medication Review Oceanographer) Complete

## 2023-08-09 NOTE — Discharge Summary (Signed)
 Physician Discharge Summary  Richard Andrews WNU:[id number] DOB: 1961-04-06 DOA: 07/31/2023  PCP: [name], NP  Admit date: 07/31/2023 Discharge date: 08/11/2023  Admitted From: home Disposition:  SNF  Recommendations for Outpatient Follow-up:  Follow up with PCP in 1-2 weeks Please obtain BMP/CBC in one week  Home Health: none Equipment/Devices: none  Discharge Condition: stable CODE STATUS: DNR Diet Orders (From admission, onward)     Start     Ordered   08/01/23 0822  Diet regular Room service appropriate? Yes; Fluid consistency: Thin  Diet effective now       Question Answer Comment  Room service appropriate? Yes   Fluid consistency: Thin      08/01/23 0823            HPI: Per admitting MD, Richard Andrews is a 63 y.o. male with medical history significant for severe alcohol use disorder, history of complicated withdrawals, acute traumatic subdural hematoma, presumed liver cirrhosis with multiple recent hospital admissions who is now being admitted to the hospital with lactic acidosis after an unwitnessed fall.  Spoke with the patient's wife with whom he is separated, but who checks on him frequently.  She states that he is essentially been in his usual chronically ill health, no new concerns.  Yesterday he was checking on him, he seemed to be fine, but while she was outside he fell inside the house.  He was never more confused, was conscious the whole time.  She does not think he hit his head or lost consciousness, though she was technically not present when he fell.  They called EMS because they were having trouble getting him up off the ground, and his right shoulder abrasion was bleeding.  She denies any recent illness, no recent vomiting, nausea, fevers, chills, complaints of pain or diarrhea.  Here in the emergency department, he has been tachycardic, lab work is significant for severely elevated lactic acid level.  However there are no signs of acute infection, abdomen  is nonacute, no signs or symptoms of acute infection.  Currently he is resting comfortably, he denies to me any pain other than in his right shoulder, any vomiting, fevers, abdominal pain, or other complaints.   Hospital Course / Discharge diagnoses: Principal Problem:   Lactic acidosis Active Problems:   Hypomagnesemia   Hypophosphatemia  Principal problems Alcoholism Severe protein calorie malnutrition Generalized weakness due to alcoholism: Starvation, electrolyte abnormality Hypokalemia Hypomagnesemia - Patient was admitted and started on Librium taper, IV fluids, electrolyte supplementation. He had no evidence of alcohol withdrawal here, his intoxication resolved, and his electrolytes were supplemented.  He is now stable and ready for discharge. Continue Ensure, MVI, and ongoing therapy following discharge.  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  He has had no acute encephalopathy while here. Pancytopenia - Counts stable, due to alcoholism Chronic subdural hematoma - CT head on admission 2/17 showed no change Hyponatremia - Stable  Sepsis ruled out   Discharge Instructions  Discharge Instructions     Ambulatory referral to Physical Therapy   Complete by: As directed       Allergies as of 08/11/2023   No Known Allergies      Medication List     STOP taking these medications    furosemide 20 MG tablet Commonly known as: LASIX       TAKE these medications  feeding supplement Liqd Take 237 mLs by mouth 3 (three) times daily between meals.   folic acid 1 MG tablet Commonly known as: FOLVITE Take 1 tablet (1 mg total) by mouth daily.   lactulose 10 GM/15ML solution Commonly known as: CHRONULAC Take 30 mLs (20 g total) by mouth 2 (two) times daily.   magnesium oxide 400 (240  Mg) MG tablet Commonly known as: MAG-OX Take 1 tablet (400 mg total) by mouth daily.   multivitamin with minerals Tabs tablet Take 1 tablet by mouth daily.   oxyCODONE 5 MG immediate release tablet Commonly known as: Oxy IR/ROXICODONE Take 1 tablet (5 mg total) by mouth every 6 (six) hours as needed for breakthrough pain.   pantoprazole 40 MG tablet Commonly known as: PROTONIX Take 1 tablet (40 mg total) by mouth daily.   thiamine 100 MG tablet Commonly known as: Vitamin B-1 Take 1 tablet (100 mg total) by mouth daily.        Contact information for follow-up providers     [facility] at [facility] Call.   Specialty: Rehabilitation Why: physical therapy outpatient Contact information: [address] [place] [address] [phone]             Contact information for after-discharge care     Destination     [facility] Preferred SNF .   Service: Skilled Nursing Contact information: [address] [place] [address] [phone]                     Consultations:  Procedures/Studies:  CT SHOULDER RIGHT WO CONTRAST Result Date: 08/01/2023 CLINICAL DATA:  Shoulder pain. Stress fracture suspected. Larey Seat and was found on floor. Prior ORIF of right proximal humeral fracture. EXAM: CT OF THE UPPER RIGHT EXTREMITY WITHOUT CONTRAST TECHNIQUE: Multidetector CT imaging of the upper right extremity was performed according to the standard protocol. RADIATION DOSE REDUCTION: This exam was performed according to the departmental dose-optimization program which includes automated exposure control, adjustment of the mA and/or kV according to patient size and/or use of iterative reconstruction technique. COMPARISON:  Right shoulder radiographs 07/07/2023, 05/03/2023, 11/15/2022, 09/24/2022 FINDINGS: Bones/Joint/Cartilage Redemonstration of proximal humeral lateral plate and screw  fixation of the previously seen comminuted right humeral surgical neck and greater tuberosity fracture that was acute on 09/24/2022 radiographs. One of the screws extending through the inferior medial humeral neck is approximately 3 mm proud (axial series 2, image 43, coronal series 7, image 63) to the medial humeral head articular surface. Minimal 1 mm extension of the most anterior superior screw through the anterior superior humeral head articular surface (sagittal series 8, image 90 and axial series 2, image 36). There appears to be partial fusion and partial far anterior lucency (coronal series 7, image 58) of the prior now remote proximal right humeral surgical neck fracture. This appears to be the baseline how this fracture healed. There is mild irregularity within the superior aspect of the greater tuberosity as seen on recent radiographs, which appears to be from the old fracture. No definite acute fracture within the visualized proximal right humerus. Severe inferior glenohumeral joint space narrowing and bone-on-bone contact. Large inferior humeral head-neck junction degenerative osteophytosis. Subacute to chronic posterolateral right fourth and posterior right sixth and seventh rib fractures, with callus formation and some areas of persistent fracture line lucency. Ligaments Suboptimally assessed by CT. Muscles and Tendons No rotator cuff muscle atrophy. Soft tissues Mild posterior dependent subpleural likely subsegmental atelectasis within  the partially visualized right upper lung. IMPRESSION: 1. Redemonstration of proximal humeral lateral plate and screw fixation of the previously seen comminuted right humeral surgical neck and greater tuberosity fracture that was acute on 09/24/2022 radiographs. There appears to be partial fusion and partial far anterior lucency of the prior now remote proximal right humeral surgical neck fracture. This appears to be the baseline how this fracture healed, and no  definite new acute fracture is seen. 2. Severe inferior glenohumeral joint space narrowing and bone-on-bone contact. 3. Subacute to chronic posterolateral right fourth and posterior right sixth and seventh rib fractures, with callus formation and some areas of persistent fracture line lucency. Electronically Signed   By: Neita Garnet M.D.   On: 08/01/2023 11:54   CT Cervical Spine Wo Contrast Result Date: 07/31/2023 CLINICAL DATA:  Found on the floor.  Neck pain. EXAM: CT CERVICAL SPINE WITHOUT CONTRAST TECHNIQUE: Multidetector CT imaging of the cervical spine was performed without intravenous contrast. Multiplanar CT image reconstructions were also generated. RADIATION DOSE REDUCTION: This exam was performed according to the departmental dose-optimization program which includes automated exposure control, adjustment of the mA and/or kV according to patient size and/or use of iterative reconstruction technique. COMPARISON:  01/22/2023 FINDINGS: Alignment: No malalignment. Skull base and vertebrae: No regional fracture. Soft tissues and spinal canal: No significant soft tissue finding. Disc levels: Ordinary degenerative spondylosis C3-4, C4-5, C5-6 and C6-7. Facet osteoarthritis on the left at C2-3 and C3-4. Some bony foraminal narrowing but no compressive canal stenosis. Upper chest: Scarring at the lung apices. Other: None IMPRESSION: No acute or traumatic finding. Ordinary degenerative spondylosis and facet osteoarthritis as outlined above. Electronically Signed   By: Paulina Fusi M.D.   On: 07/31/2023 20:36   DG Shoulder Right Result Date: 07/31/2023 CLINICAL DATA:  Larey Seat.  Found on the floor. EXAM: RIGHT SHOULDER - 2+ VIEW COMPARISON:  07/07/2023 FINDINGS: Previous ORIF of right proximal humeral fracture. No definite acute fracture. Cannot completely rule out a avulsion fracture at the rotator cuff insertion, but I think it is more likely that the appearance is due to the chronic healed fracture.  IMPRESSION: Previous ORIF of right proximal humeral fracture. No definite acute fracture. Cannot completely exclude a avulsion fracture at the rotator cuff insertion, but I think it is more likely that the appearance is due to the chronic healed fracture. Is the patient point tender at the greater tuberosity? Electronically Signed   By: Paulina Fusi M.D.   On: 07/31/2023 20:35   DG Chest Portable 1 View Result Date: 07/31/2023 CLINICAL DATA:  Found on the floor.  Intoxicated. EXAM: PORTABLE CHEST 1 VIEW COMPARISON:  07/07/2023 FINDINGS: Heart size is normal. Mediastinal shadows are normal. Chronic calcified granuloma in the medial right lung base. The lungs are otherwise clear. Old healed rib fractures. No acute regional finding. IMPRESSION: No active disease. Old healed rib fractures. Electronically Signed   By: Paulina Fusi M.D.   On: 07/31/2023 20:32   CT Head Wo Contrast Result Date: 07/31/2023 CLINICAL DATA:  Head trauma.  Abnormal mental status. EXAM: CT HEAD WITHOUT CONTRAST TECHNIQUE: Contiguous axial images were obtained from the base of the skull through the vertex without intravenous contrast. RADIATION DOSE REDUCTION: This exam was performed according to the departmental dose-optimization program which includes automated exposure control, adjustment of the mA and/or kV according to patient size and/or use of iterative reconstruction technique. COMPARISON:  07/05/2023 FINDINGS: Brain: Generalized brain atrophy. Mild chronic small-vessel ischemic change of the hemispheric white  matter. No sign of acute infarction. 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 No suspicion of acute component. No mass effect. 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 Vascular: No other vascular finding. Skull: Negative Sinuses/Orbits: Small amount of fluid  in the left maxillary sinus. Orbits negative. Other: None IMPRESSION: No acute or traumatic finding. Atrophy and chronic small-vessel ischemic changes of the white matter. Chronic arachnoid cyst at the anterior middle cranial fossa on the left. 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 No suspicion of acute component. No mass effect. 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 Electronically Signed   By: Paulina Fusi M.D.   On: 07/31/2023 20:31     Subjective: - no chest pain, shortness of breath, no abdominal pain, nausea or vomiting.   Discharge Exam: BP 119/84 (BP Location: Right Arm)   Pulse 68   Temp 98.5 F (36.9 C) (Oral)   Resp 18   Ht 5\' 11"  (1.803 m)   Wt 55 kg   SpO2 98%   BMI 16.91 kg/m   General: Pt is alert, awake, not in acute distress Cardiovascular: RRR, S1/S2 +, no rubs, no gallops Respiratory: CTA bilaterally, no wheezing, no rhonchi Abdominal: Soft, NT, ND, bowel sounds + Extremities: no edema, no cyanosis    The results of significant diagnostics from this hospitalization (including imaging, microbiology, ancillary and laboratory) are listed below for reference.     Microbiology: No results found for this or any previous visit (from the past 240 hours).    Labs: Basic Metabolic Panel: Recent Labs  Lab 08/05/23 0810 08/06/23 0527 08/09/23 0513  NA 132* 133* 133*  K 3.7 3.3* 3.5  CL 100 101 100  CO2 25 24 21*  GLUCOSE 99 92 123*  BUN 14 15 13   CREATININE 0.70 0.74 0.48*  CALCIUM 9.0 8.7* 8.8*  MG 1.6* 1.8  --   PHOS 1.1* 3.9  --    Liver Function Tests: Recent Labs  Lab 08/09/23 0513  AST 33  ALT 20  ALKPHOS 79  BILITOT 0.9  PROT 6.7  ALBUMIN 3.1*   CBC: Recent Labs  Lab 08/05/23 0810 08/06/23 0527 08/09/23 0513  WBC 3.5* 2.3* 2.6*  HGB 8.9* 8.4* 8.6*  HCT 27.2* 26.6* 26.9*  MCV 103.8* 107.7* 107.6*  PLT 61* 67* 143*   CBG: No results for  input(s): "GLUCAP" in the last 168 hours. Hgb A1c No results for input(s): "HGBA1C" in the last 72 hours. Lipid Profile No results for input(s): "CHOL", "HDL", "LDLCALC", "TRIG", "CHOLHDL", "LDLDIRECT" in the last 72 hours. Thyroid function studies No results for input(s): "TSH", "T4TOTAL", "T3FREE", "THYROIDAB" in the last 72 hours.  Invalid input(s): "FREET3" Urinalysis    Component Value Date/Time   COLORURINE YELLOW 08/01/2023 0235   APPEARANCEUR CLEAR 08/01/2023 0235   LABSPEC 1.010 08/01/2023 0235   PHURINE 6.0 08/01/2023 0235   GLUCOSEU NEGATIVE 08/01/2023 0235   HGBUR NEGATIVE 08/01/2023 0235   BILIRUBINUR NEGATIVE 08/01/2023 0235   KETONESUR NEGATIVE 08/01/2023 0235   PROTEINUR NEGATIVE 08/01/2023 0235   NITRITE NEGATIVE 08/01/2023 0235   LEUKOCYTESUR NEGATIVE 08/01/2023 0235    FURTHER DISCHARGE INSTRUCTIONS:   Get Medicines reviewed and adjusted: Please take all your medications with you for your next visit with your Primary MD   Laboratory/radiological data: Please request  your Primary MD to go over all hospital tests and procedure/radiological results at the follow up, please ask your Primary MD to get all Hospital records sent to his/her office.   In some cases, they will be blood work, cultures and biopsy results pending at the time of your discharge. Please request that your primary care M.D. goes through all the records of your hospital data and follows up on these results.   Also Note the following: If you experience worsening of your admission symptoms, develop shortness of breath, life threatening emergency, suicidal or homicidal thoughts you must seek medical attention immediately by calling 911 or calling your MD immediately  if symptoms less severe.   You must read complete instructions/literature along with all the possible adverse reactions/side effects for all the Medicines you take and that have been prescribed to you. Take any new Medicines after you  have completely understood and accpet all the possible adverse reactions/side effects.    Do not drive when taking Pain medications or sleeping medications (Benzodaizepines)   Do not take more than prescribed Pain, Sleep and Anxiety Medications. It is not advisable to combine anxiety,sleep and pain medications without talking with your primary care practitioner   Special Instructions: If you have smoked or chewed Tobacco  in the last 2 yrs please stop smoking, stop any regular Alcohol  and or any Recreational drug use.   Wear Seat belts while driving.   Please note: You were cared for by a hospitalist during your hospital stay. Once you are discharged, your primary care physician will handle any further medical issues. Please note that NO REFILLS for any discharge medications will be authorized once you are discharged, as it is imperative that you return to your primary care physician (or establish a relationship with a primary care physician if you do not have one) for your post hospital discharge needs so that they can reassess your need for medications and monitor your lab values.  Time coordinating discharge: 35 minutes  SIGNED:  Pamella Pert, MD, PhD 08/11/2023, 11:04 AM

## 2023-08-09 NOTE — Plan of Care (Signed)

## 2023-08-10 NOTE — Plan of Care (Signed)

## 2023-08-10 NOTE — Progress Notes (Signed)
 Patient seen and examined this morning, vitals reviewed.  Stable for discharge, please refer to DC summary 2/26  Copeland Neisen M. Elvera Lennox, MD, PhD [facility]  Between 7 am - 7 pm you can contact me via Amion (for emergencies) or Securechat (non urgent matters).  I am not available 7 pm - 7 am, please contact night coverage MD/APP via Amion

## 2023-08-11 DIAGNOSIS — E872 Acidosis, unspecified: Secondary | ICD-10-CM | POA: Diagnosis not present
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
## 2023-08-11 NOTE — Progress Notes (Signed)
 PROGRESS NOTE  Richard Andrews NGE:[id number] DOB: 1961/02/25 DOA: 07/31/2023 PCP: [name], NP   LOS: 10 days   Brief Narrative / Interim history: Richard Andrews is a 63 y.o. male with medical history significant for severe alcohol use disorder, history of complicated withdrawals, acute traumatic subdural hematoma, presumed liver cirrhosis with multiple recent hospital admissions who is now being admitted to the hospital with lactic acidosis after an unwitnessed fall.  Spoke with the patient's wife with whom he is separated, but who checks on him frequently.  She states that he is essentially been in his usual chronically ill health, no new concerns.  Yesterday he was checking on him, he seemed to be fine, but while she was outside he fell inside the house.  He was never more confused, was conscious the whole time.  She does not think he hit his head or lost consciousness, though she was technically not present when he fell.  They called EMS because they were having trouble getting him up off the ground, and his right shoulder abrasion was bleeding.  She denies any recent illness, no recent vomiting, nausea, fevers, chills, complaints of pain or diarrhea.  Here in the emergency department, he has been tachycardic, lab work is significant for severely elevated lactic acid level.  However there are no signs of acute infection, abdomen is nonacute, no signs or symptoms of acute infection.  Currently he is resting comfortably, he denies to me any pain other than in his right shoulder, any vomiting, fevers, abdominal pain, or other complaints.   Subjective / 24h Interval events: No events  Assesement and Plan: Principal Problem:   Lactic acidosis Active Problems:   Hypomagnesemia   Hypophosphatemia   Principal problem Alcoholism Severe protein calorie malnutrition Generalized weakness due to alcoholism: Starvation, electrolyte abnormality Hypokalemia Hypomagnesemia - Patient was admitted and  started on Librium taper, IV fluids, electrolyte supplementation. He had no evidence of alcohol withdrawal here, his intoxication resolved, and his electrolytes were supplemented.  He is now stable and ready for discharge. Continue Ensure, MVI, and ongoing therapy following discharge. 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  He has had no acute encephalopathy while here. Pancytopenia - Counts stable, due to alcoholism Chronic subdural hematoma - CT head on admission 2/17 showed no change Hyponatremia - Stable  Scheduled Meds:  feeding supplement  237 mL Oral TID BM   folic acid  1 mg Oral Daily   lactulose  20 g Oral BID   multivitamin with minerals  1 tablet Oral Daily   pantoprazole  40 mg Oral Daily   potassium chloride  20 mEq Oral QHS   thiamine (VITAMIN B1) injection  100 mg Intravenous Daily   Or   thiamine  100 mg Oral Daily   Continuous Infusions: PRN Meds:.acetaminophen, albuterol, ibuprofen, metoCLOPramide (REGLAN) injection, oxyCODONE, traZODone  Current Outpatient Medications  Medication Instructions   feeding supplement (ENSURE ENLIVE / ENSURE PLUS) LIQD 237 mLs, Oral, 3 times daily between meals   folic acid (FOLVITE) 1 mg, Oral, Daily   furosemide (LASIX) 20 mg, Oral, Daily   lactulose (CHRONULAC) 20 g, Oral, 2 times daily   magnesium oxide (MAG-OX) 400 mg, Oral, Daily   Multiple Vitamin (MULTIVITAMIN WITH MINERALS) TABS tablet 1 tablet, Oral, Daily   oxyCODONE (OXY  IR/ROXICODONE) 5 mg, Oral, Every 6 hours PRN   pantoprazole (PROTONIX) 40 mg, Oral, Daily   thiamine (VITAMIN B-1) 100 mg, Oral, Daily    Diet Orders (From admission, onward)     Start     Ordered   08/01/23 0822  Diet regular Room service appropriate?  Yes; Fluid consistency: Thin  Diet effective now       Question Answer Comment  Room service appropriate? Yes   Fluid consistency: Thin      08/01/23 0823            DVT prophylaxis: SCDs Start: 08/06/23 1411   Lab Results  Component Value Date   PLT 143 (L) 08/09/2023      Code Status: Limited: Do not attempt resuscitation (DNR) -DNR-LIMITED -Do Not Intubate/DNI   Family Communication: No family at bedside  Status is: Inpatient Remains inpatient appropriate because: Awaiting placement   Level of care: Med-Surg  Consultants:  none  Objective: Vitals:   08/10/23 0643 08/10/23 1414 08/10/23 2101 08/11/23 0531  BP: 114/89 108/76 113/84 119/84  Pulse: 68 76 72 68  Resp: 18 16 18 18   Temp: 98.4 F (36.9 C) 98.1 F (36.7 C) 99 F (37.2 C) 98.5 F (36.9 C)  TempSrc: Oral Oral Oral Oral  SpO2: 99% 98% 98% 98%  Weight:      Height:        Intake/Output Summary (Last 24 hours) at 08/11/2023 1103 Last data filed at 08/11/2023 0900 Gross per 24 hour  Intake 1440 ml  Output 1750 ml  Net -310 ml   Wt Readings from Last 3 Encounters:  08/01/23 55 kg  07/13/23 54 kg  05/25/23 57.2 kg    Examination:  Constitutional: NAD Eyes: no scleral icterus ENMT: Mucous membranes are moist.  Neck: normal, supple Respiratory: clear to auscultation bilaterally, no wheezing, no crackles. Cardiovascular: Regular rate and rhythm, no murmurs / rubs / gallops. No LE edema.  Abdomen: non distended, no tenderness. Bowel sounds positive.  Musculoskeletal: no clubbing / cyanosis.    Data Reviewed: I have independently reviewed following labs and imaging studies   CBC Recent Labs  Lab 08/05/23 0810 08/06/23 0527 08/09/23 0513  WBC 3.5* 2.3* 2.6*  HGB 8.9* 8.4* 8.6*  HCT 27.2* 26.6* 26.9*  PLT 61* 67* 143*  MCV 103.8* 107.7* 107.6*  MCH 34.0 34.0 34.4*  MCHC 32.7 31.6 32.0  RDW 14.6 15.1 15.5    Recent Labs  Lab 08/05/23 0810 08/06/23 0527 08/09/23 0513  NA 132*  133* 133*  K 3.7 3.3* 3.5  CL 100 101 100  CO2 25 24 21*  GLUCOSE 99 92 123*  BUN 14 15 13   CREATININE 0.70 0.74 0.48*  CALCIUM 9.0 8.7* 8.8*  AST  --   --  33  ALT  --   --  20  ALKPHOS  --   --  79  BILITOT  --   --  0.9  ALBUMIN  --   --  3.1*  MG 1.6* 1.8  --     ------------------------------------------------------------------------------------------------------------------ No results for input(s): "CHOL", "HDL", "LDLCALC", "TRIG", "CHOLHDL", "LDLDIRECT" in the last 72 hours.  Lab Results  Component Value Date   HGBA1C 5.2 03/18/2016   ------------------------------------------------------------------------------------------------------------------ No results for input(s): "TSH", "T4TOTAL", "T3FREE", "THYROIDAB" in the last 72 hours.  Invalid input(s): "FREET3"  Cardiac Enzymes No results for input(s): "CKMB", "TROPONINI", "MYOGLOBIN" in the last 168 hours.  Invalid input(s): "CK" ------------------------------------------------------------------------------------------------------------------    Component Value Date/Time  BNP 10.2 05/07/2023 0359    CBG: No results for input(s): "GLUCAP" in the last 168 hours.  No results found for this or any previous visit (from the past 240 hours).   Radiology Studies: No results found.   Pamella Pert, MD, PhD [facility]  Between 7 am - 7 pm I am available, please contact me via Amion (for emergencies) or Securechat (non urgent messages)  Between 7 pm - 7 am I am not available, please contact night coverage MD/APP via Amion

## 2023-08-11 NOTE — TOC Transition Note (Signed)
 Transition of Care [facility]) - Discharge Note   Patient Details  Name: Richard Andrews MRN: [id number] Date of Birth: 1961-01-05  [facility] CM/SW Contact:  Amada Jupiter, LCSW Phone Number: 08/11/2023, 2:44 PM   Clinical Narrative:     Alerted that [employer] has received insurance authorization and can admit pt to facility today.  MD has medically cleared.  VM left for wife but was able to reach pt's brother listed as other contact.  Family aware.  PTAR called at 2:45pm.  RN to call report to [phone].  No further TOC needs.   Final next level of care: Skilled Nursing Facility Barriers to Discharge: Barriers Resolved   Patient Goals and CMS Choice            Discharge Placement              Patient chooses bed at: [facility] Patient to be transferred to facility by: PTAR Name of family member notified: VM left for wife, Trula Ore and spoke with pt's brother who will let wife know Patient and family notified of of transfer: 08/11/23  Discharge Plan and Services Additional resources added to the After Visit Summary for                                       Social Drivers of Health (SDOH) Interventions SDOH Screenings   Food Insecurity: Food Insecurity Present (08/01/2023)  Housing: Low Risk  (08/01/2023)  Transportation Needs: No Transportation Needs (08/01/2023)  Utilities: Not At Risk (08/01/2023)  Depression (PHQ2-9): Low Risk  (03/07/2023)  Social Connections: Unknown (08/01/2023)  Recent Concern: Social Connections - Socially Isolated (07/06/2023)  Tobacco Use: High Risk (08/01/2023)     Readmission Risk Interventions    07/11/2023    4:08 PM  Readmission Risk Prevention Plan  Transportation Screening Complete  PCP or Specialist Appt within 3-5 Days Complete  HRI or Home Care Consult Complete  Social Work Consult for Recovery Care Planning/Counseling Complete  Palliative Care Screening Not Applicable  Medication  Review Oceanographer) Complete

## 2023-09-06 ENCOUNTER — Telehealth: Payer: Self-pay

## 2023-09-06 NOTE — Transitions of Care (Post Inpatient/ED Visit) (Unsigned)
   09/06/2023  Name: Richard Andrews MRN: [id number] DOB: 04-Sep-1960  Today's TOC FU Call Status: Today's TOC FU Call Status:: Unsuccessful Call (1st Attempt) Unsuccessful Call (1st Attempt) Date: 09/06/23  Attempted to reach the patient regarding the most recent Inpatient/ED visit.  Follow Up Plan: Additional outreach attempts will be made to reach the patient to complete the Transitions of Care (Post Inpatient/ED visit) call.   Signature Karena Addison, LPN [facility] Nurse Health Advisor Direct Dial [phone]

## 2023-09-07 ENCOUNTER — Telehealth: Payer: Self-pay

## 2023-09-07 DIAGNOSIS — F1020 Alcohol dependence, uncomplicated: Secondary | ICD-10-CM

## 2023-09-07 DIAGNOSIS — Z5986 Financial insecurity: Secondary | ICD-10-CM

## 2023-09-07 NOTE — Transitions of Care (Post Inpatient/ED Visit) (Unsigned)
   09/07/2023  Name: Richard Andrews MRN: [id number] DOB: 05-05-1961  Today's TOC FU Call Status: Today's TOC FU Call Status:: Unsuccessful Call (2nd Attempt) Unsuccessful Call (1st Attempt) Date: 09/06/23 Unsuccessful Call (2nd Attempt) Date: 09/07/23  Attempted to reach the patient regarding the most recent Inpatient/ED visit.  Follow Up Plan: Additional outreach attempts will be made to reach the patient to complete the Transitions of Care (Post Inpatient/ED visit) call.   Signature Karena Addison, LPN [facility] Nurse Health Advisor Direct Dial [phone]

## 2023-09-07 NOTE — Telephone Encounter (Signed)
 Ok to give verbal orders for physical therapy and for occupational therapy with treatment. Placing referral for social work.

## 2023-09-07 NOTE — Telephone Encounter (Signed)
 Copied from CRM [id number]. Topic: Clinical - Home Health Verbal Orders >> Sep 06, 2023  4:56 PM Higinio Roger wrote: Caller/Agency: [facility] Callback Number: [phone] Service Requested: Physical Therapy, Frequency: Once a week for 1 week Twice a week for 4 weeks Once a week for 3 weeks Any new concerns about the patient? Yes. Also requesting occupational therapy and treatment. Needs social worker because he does not have means to buy medication and nurse to assist with medication management

## 2023-09-08 ENCOUNTER — Telehealth: Payer: Self-pay | Admitting: *Deleted

## 2023-09-08 NOTE — Transitions of Care (Post Inpatient/ED Visit) (Signed)
   09/08/2023  Name: Richard Andrews MRN: [id number] DOB: March 11, 1961  Today's TOC FU Call Status: Today's TOC FU Call Status:: Unsuccessful Call (3rd Attempt) Unsuccessful Call (1st Attempt) Date: 09/06/23 Unsuccessful Call (2nd Attempt) Date: 09/07/23 [facility] FU Call Complete Date: 09/08/23  Attempted to reach the patient regarding the most recent Inpatient/ED visit.  Follow Up Plan: Additional outreach attempts will be made to reach the patient to complete the Transitions of Care (Post Inpatient/ED visit) call.   Signature Karena Addison, LPN [facility] Nurse Health Advisor Direct Dial [phone]

## 2023-09-08 NOTE — Progress Notes (Signed)
 Complex Care Management Note Care Guide Note  09/08/2023 Name: Richard Andrews MRN: [id number] DOB: 17-Apr-1961   Complex Care Management Outreach Attempts: An unsuccessful telephone outreach was attempted today to offer the patient information about available complex care management services.  Follow Up Plan:  Additional outreach attempts will be made to offer the patient complex care management information and services.   Encounter Outcome:  No Answer  Burman Nieves, CMA, [place]  [facility], [facility] Guide Direct Dial: [phone]  Fax: [phone] Website: [email or web address]

## 2023-09-11 NOTE — Progress Notes (Unsigned)
 Complex Care Management Note Care Guide Note  09/11/2023 Name: Richard Andrews MRN: [id number] DOB: [birth year]   Complex Care Management Outreach Attempts: A second unsuccessful outreach was attempted today to offer the patient with information about available complex care management services.  Follow Up Plan:  Additional outreach attempts will be made to offer the patient complex care management information and services.   Encounter Outcome:  No Answer  Burman Nieves, CMA [place]  [facility], [facility] Guide Direct Dial: [phone]  Fax: [phone] Website: [email or web address]

## 2023-09-12 NOTE — Progress Notes (Signed)
 Complex Care Management Note Care Guide Note  09/12/2023 Name: Richard Andrews MRN: [id number] DOB: 29-Mar-1961   Complex Care Management Outreach Attempts: A third unsuccessful outreach was attempted today to offer the patient with information about available complex care management services.  Follow Up Plan:  No further outreach attempts will be made at this time. We have been unable to contact the patient to offer or enroll patient in complex care management services.  Encounter Outcome:  No Answer  Burman Nieves, CMA, [place]  [facility], [facility] Guide Direct Dial: [phone]  Fax: [phone] Website: [email or web address]

## 2023-09-21 ENCOUNTER — Telehealth: Payer: Self-pay | Admitting: *Deleted

## 2023-09-21 NOTE — Telephone Encounter (Signed)
 Needs an appointment for appropriate documentation to assure insurance coverage. Ok to be a virtual visit if needed.

## 2023-09-21 NOTE — Telephone Encounter (Signed)
 Attempted to call Richard Andrews but unable to reach. Left VM for her to call the office back so we can work on getting pt scheduled for an appt.

## 2023-09-21 NOTE — Telephone Encounter (Signed)
 Routing encounter to Joy for her to review about the DME order needed.

## 2023-09-21 NOTE — Telephone Encounter (Signed)
 Copied from CRM [id number]. Topic: Clinical - Home Health Verbal Orders >> Sep 21, 2023 11:17 AM Louie Boston wrote: Caller/Agency: Jaquita Folds Callback Number: [phone] Service Requested: Hospital Bed/DME Frequency: n/a Any new concerns about the patient? No  Jaquita Folds from [facility] called in for referral for DME equipment. Patient needs hospital bed and falls out of bed frequently. Please contact kim with any questions at [phone].

## 2023-09-22 NOTE — Telephone Encounter (Signed)
 Patient scheduled.

## 2023-09-25 ENCOUNTER — Other Ambulatory Visit: Payer: Self-pay

## 2023-09-25 ENCOUNTER — Emergency Department (HOSPITAL_COMMUNITY)
Admission: EM | Admit: 2023-09-25 | Discharge: 2023-09-26 | Disposition: A | Discharge status: Home / Self Care | Payer: Self-pay | Source: Home / Self Care | Attending: Emergency Medicine | Admitting: Emergency Medicine

## 2023-09-25 ENCOUNTER — Ambulatory Visit: Payer: Self-pay | Admitting: Medical-Surgical

## 2023-09-25 DIAGNOSIS — K7460 Unspecified cirrhosis of liver: Secondary | ICD-10-CM | POA: Diagnosis not present

## 2023-09-25 DIAGNOSIS — R519 Headache, unspecified: Secondary | ICD-10-CM | POA: Insufficient documentation

## 2023-09-25 DIAGNOSIS — Y92410 Unspecified street and highway as the place of occurrence of the external cause: Secondary | ICD-10-CM | POA: Insufficient documentation

## 2023-09-25 DIAGNOSIS — E876 Hypokalemia: Secondary | ICD-10-CM | POA: Diagnosis not present

## 2023-09-25 DIAGNOSIS — Y908 Blood alcohol level of 240 mg/100 ml or more: Secondary | ICD-10-CM | POA: Insufficient documentation

## 2023-09-25 DIAGNOSIS — F10129 Alcohol abuse with intoxication, unspecified: Secondary | ICD-10-CM | POA: Diagnosis not present

## 2023-09-25 DIAGNOSIS — I10 Essential (primary) hypertension: Secondary | ICD-10-CM | POA: Diagnosis not present

## 2023-09-25 DIAGNOSIS — F10920 Alcohol use, unspecified with intoxication, uncomplicated: Secondary | ICD-10-CM | POA: Insufficient documentation

## 2023-09-25 DIAGNOSIS — D539 Nutritional anemia, unspecified: Secondary | ICD-10-CM | POA: Diagnosis not present

## 2023-09-25 DIAGNOSIS — S2241XA Multiple fractures of ribs, right side, initial encounter for closed fracture: Secondary | ICD-10-CM | POA: Insufficient documentation

## 2023-09-25 DIAGNOSIS — R4182 Altered mental status, unspecified: Secondary | ICD-10-CM | POA: Diagnosis not present

## 2023-09-25 DIAGNOSIS — R109 Unspecified abdominal pain: Secondary | ICD-10-CM | POA: Insufficient documentation

## 2023-09-25 DIAGNOSIS — D61818 Other pancytopenia: Secondary | ICD-10-CM | POA: Diagnosis not present

## 2023-09-25 DIAGNOSIS — R079 Chest pain, unspecified: Secondary | ICD-10-CM | POA: Diagnosis not present

## 2023-09-25 DIAGNOSIS — K922 Gastrointestinal hemorrhage, unspecified: Secondary | ICD-10-CM | POA: Diagnosis present

## 2023-09-25 DIAGNOSIS — E871 Hypo-osmolality and hyponatremia: Secondary | ICD-10-CM | POA: Diagnosis not present

## 2023-09-25 LAB — CBC
HCT: 34.2 % — ABNORMAL LOW (ref 39.0–52.0)
Hemoglobin: 11.3 g/dL — ABNORMAL LOW (ref 13.0–17.0)
MCH: 33.2 pg (ref 26.0–34.0)
MCHC: 33 g/dL (ref 30.0–36.0)
MCV: 100.6 fL — ABNORMAL HIGH (ref 80.0–100.0)
Platelets: 80 10*3/uL — ABNORMAL LOW (ref 150–400)
RBC: 3.4 MIL/uL — ABNORMAL LOW (ref 4.22–5.81)
RDW: 15.1 % (ref 11.5–15.5)
WBC: 3.4 10*3/uL — ABNORMAL LOW (ref 4.0–10.5)
nRBC: 0 % (ref 0.0–0.2)

## 2023-09-25 LAB — TYPE AND SCREEN
ABO/RH(D): O POS
Antibody Screen: NEGATIVE

## 2023-09-25 LAB — COMPREHENSIVE METABOLIC PANEL WITH GFR
ALT: 14 U/L (ref 0–44)
AST: 46 U/L — ABNORMAL HIGH (ref 15–41)
Albumin: 3.8 g/dL (ref 3.5–5.0)
Alkaline Phosphatase: 122 U/L (ref 38–126)
Anion gap: 14 (ref 5–15)
BUN: 9 mg/dL (ref 8–23)
CO2: 25 mmol/L (ref 22–32)
Calcium: 8.3 mg/dL — ABNORMAL LOW (ref 8.9–10.3)
Chloride: 100 mmol/L (ref 98–111)
Creatinine, Ser: 0.65 mg/dL (ref 0.61–1.24)
GFR, Estimated: >60 mL/min (ref >60)
Glucose, Bld: 94 mg/dL (ref 70–99)
Potassium: 3.5 mmol/L (ref 3.5–5.1)
Sodium: 139 mmol/L (ref 135–145)
Total Bilirubin: 1.3 mg/dL — ABNORMAL HIGH (ref 0.0–1.2)
Total Protein: 7.7 g/dL (ref 6.5–8.1)

## 2023-09-25 NOTE — Telephone Encounter (Signed)
 Copied from CRM [id number]. Topic: Clinical - Red Word Triage >> Sep 25, 2023  4:49 PM Tiffany H wrote: Red Word that prompted transfer to Nurse Triage:   Debbie with [facility] called to advise that patient is non-compliant with home health - not taking meds. Additionally, patient lost electricity today. Patient has no water, no way of cooking. Patient has been laying in his own filth. PT was unable to work with patient because the condition of his home is unsafe and unsanitary. Patient is passed out in bed.   Debbie with [facility] called on behalf of the patient. She states that she is at the patient's house and that they have been unable to wake the patient. They report that the house has no water and no electricity at this time and that the patient has been laying in his own filth and not caring for himself. They wanted to make sure his PCP was aware and wanted to know if we would advise him going to the ED. I informed her that I would let the office know what's going on and that my current recommendation would be for the patient to go to the ED for evaluation and treatment. She will speak with hi family about this recommendation.     Reason for Disposition  Nursing judgment or information in reference  Answer Assessment - Initial Assessment Questions 1. REASON FOR CALL: "What is your main concern right now?"     Home health called for patient who is not compliant with home health and is in unsanitary living conditions according to them.  Protocols used: No Guideline Available-A-AH

## 2023-09-25 NOTE — Telephone Encounter (Signed)
 First attempt, will put in call back.   Copied from CRM [id number]. Topic: Clinical - Red Word Triage >> Sep 25, 2023  4:49 PM Tiffany H wrote: Red Word that prompted transfer to Nurse Triage:   Debbie with [facility] called to advise that patient is non-compliant with home health - not taking meds. Additionally, patient lost electricity today. Patient has no water, no way of cooking. Patient has been laying in his own filth. PT was unable to work with patient because the condition of his home is unsafe and unsanitary. Patient is passed out in bed.

## 2023-09-26 ENCOUNTER — Emergency Department (HOSPITAL_COMMUNITY)

## 2023-09-26 ENCOUNTER — Other Ambulatory Visit: Payer: Self-pay

## 2023-09-26 ENCOUNTER — Observation Stay (HOSPITAL_COMMUNITY)
Admission: EM | Admit: 2023-09-26 | Discharge: 2023-10-05 | Disposition: A | Discharge status: Home Health | Attending: Family Medicine | Admitting: Family Medicine

## 2023-09-26 ENCOUNTER — Encounter (HOSPITAL_COMMUNITY): Payer: Self-pay

## 2023-09-26 DIAGNOSIS — I10 Essential (primary) hypertension: Secondary | ICD-10-CM | POA: Insufficient documentation

## 2023-09-26 DIAGNOSIS — F10129 Alcohol abuse with intoxication, unspecified: Secondary | ICD-10-CM | POA: Insufficient documentation

## 2023-09-26 DIAGNOSIS — R4182 Altered mental status, unspecified: Secondary | ICD-10-CM | POA: Insufficient documentation

## 2023-09-26 DIAGNOSIS — R079 Chest pain, unspecified: Secondary | ICD-10-CM | POA: Insufficient documentation

## 2023-09-26 DIAGNOSIS — K922 Gastrointestinal hemorrhage, unspecified: Secondary | ICD-10-CM | POA: Diagnosis not present

## 2023-09-26 DIAGNOSIS — D539 Nutritional anemia, unspecified: Secondary | ICD-10-CM | POA: Insufficient documentation

## 2023-09-26 DIAGNOSIS — E876 Hypokalemia: Secondary | ICD-10-CM | POA: Insufficient documentation

## 2023-09-26 DIAGNOSIS — E871 Hypo-osmolality and hyponatremia: Secondary | ICD-10-CM | POA: Insufficient documentation

## 2023-09-26 DIAGNOSIS — K7460 Unspecified cirrhosis of liver: Secondary | ICD-10-CM | POA: Insufficient documentation

## 2023-09-26 DIAGNOSIS — F1090 Alcohol use, unspecified, uncomplicated: Secondary | ICD-10-CM | POA: Insufficient documentation

## 2023-09-26 DIAGNOSIS — D61818 Other pancytopenia: Secondary | ICD-10-CM | POA: Insufficient documentation

## 2023-09-26 DIAGNOSIS — F1010 Alcohol abuse, uncomplicated: Secondary | ICD-10-CM | POA: Insufficient documentation

## 2023-09-26 HISTORY — DX: Personal history of urinary calculi: Z87.442

## 2023-09-26 LAB — HEMOGLOBIN AND HEMATOCRIT, BLOOD
HCT: 30.5 % — ABNORMAL LOW (ref 39.0–52.0)
HCT: 31 % — ABNORMAL LOW (ref 39.0–52.0)
HCT: 31.9 % — ABNORMAL LOW (ref 39.0–52.0)
Hemoglobin: 10.1 g/dL — ABNORMAL LOW (ref 13.0–17.0)
Hemoglobin: 10.6 g/dL — ABNORMAL LOW (ref 13.0–17.0)
Hemoglobin: 9.9 g/dL — ABNORMAL LOW (ref 13.0–17.0)

## 2023-09-26 LAB — PROTIME-INR
INR: 1.2 (ref 0.8–1.2)
Prothrombin Time: 15.4 s — ABNORMAL HIGH (ref 11.4–15.2)

## 2023-09-26 LAB — HIV ANTIBODY (ROUTINE TESTING W REFLEX): HIV Screen 4th Generation wRfx: NONREACTIVE

## 2023-09-26 LAB — ETHANOL: Alcohol, Ethyl (B): 316 mg/dL (ref <10)

## 2023-09-26 LAB — ABO/RH: ABO/RH(D): O POS

## 2023-09-26 LAB — AMMONIA: Ammonia: 27 umol/L (ref 9–35)

## 2023-09-26 MED ORDER — LORAZEPAM 1 MG PO TABS
1.0000 mg | ORAL_TABLET | ORAL | Status: AC | PRN
  Administered 2023-09-27 (×2): 1 mg via ORAL
  Filled 2023-09-26 (×2): qty 1

## 2023-09-26 MED ORDER — HYDROMORPHONE HCL 1 MG/ML IJ SOLN
0.5000 mg | Freq: Once | INTRAMUSCULAR | Status: AC
  Administered 2023-09-26: 0.5 mg via INTRAVENOUS
  Filled 2023-09-26: qty 0.5

## 2023-09-26 MED ORDER — THIAMINE HCL 100 MG/ML IJ SOLN
100.0000 mg | Freq: Once | INTRAMUSCULAR | Status: AC
  Administered 2023-09-26: 100 mg via INTRAVENOUS
  Filled 2023-09-26: qty 2

## 2023-09-26 MED ORDER — PANTOPRAZOLE SODIUM 40 MG IV SOLR
40.0000 mg | Freq: Once | INTRAVENOUS | Status: AC
  Administered 2023-09-26: 40 mg via INTRAVENOUS
  Filled 2023-09-26: qty 10

## 2023-09-26 MED ORDER — PANTOPRAZOLE SODIUM 40 MG IV SOLR
40.0000 mg | INTRAVENOUS | Status: DC
  Administered 2023-09-27 – 2023-09-29 (×3): 40 mg via INTRAVENOUS
  Filled 2023-09-26 (×3): qty 10

## 2023-09-26 MED ORDER — IOHEXOL 300 MG/ML  SOLN
100.0000 mL | Freq: Once | INTRAMUSCULAR | Status: AC | PRN
  Administered 2023-09-26: 100 mL via INTRAVENOUS

## 2023-09-26 MED ORDER — ACETAMINOPHEN 325 MG PO TABS
650.0000 mg | ORAL_TABLET | Freq: Four times a day (QID) | ORAL | Status: DC | PRN
  Administered 2023-09-26 – 2023-10-05 (×10): 650 mg via ORAL
  Filled 2023-09-26 (×10): qty 2

## 2023-09-26 MED ORDER — THIAMINE HCL 100 MG/ML IJ SOLN: 100.0000 mg | Freq: Every day | INTRAMUSCULAR | Status: DC

## 2023-09-26 MED ORDER — ADULT MULTIVITAMIN W/MINERALS CH
1.0000 | ORAL_TABLET | Freq: Every day | ORAL | Status: DC
  Administered 2023-09-26 – 2023-10-05 (×10): 1 via ORAL
  Filled 2023-09-26 (×10): qty 1

## 2023-09-26 MED ORDER — ALBUTEROL SULFATE (2.5 MG/3ML) 0.083% IN NEBU: 2.5000 mg | INHALATION_SOLUTION | RESPIRATORY_TRACT | Status: DC | PRN

## 2023-09-26 MED ORDER — LORAZEPAM 2 MG/ML IJ SOLN: 1.0000 mg | INTRAMUSCULAR | Status: AC | PRN

## 2023-09-26 MED ORDER — TRAZODONE HCL 50 MG PO TABS
25.0000 mg | ORAL_TABLET | Freq: Every evening | ORAL | Status: DC | PRN
  Administered 2023-10-02 – 2023-10-04 (×3): 25 mg via ORAL
  Filled 2023-09-26 (×3): qty 1

## 2023-09-26 MED ORDER — ACETAMINOPHEN 650 MG RE SUPP: 650.0000 mg | Freq: Four times a day (QID) | RECTAL | Status: DC | PRN

## 2023-09-26 MED ORDER — ONDANSETRON HCL 4 MG PO TABS: 4.0000 mg | ORAL_TABLET | Freq: Four times a day (QID) | ORAL | Status: DC | PRN

## 2023-09-26 MED ADMIN — Folic Acid Tab 1 MG: 1 mg | ORAL | NDC 60687068111

## 2023-09-26 MED ADMIN — Thiamine Mononitrate Tab 100 MG: 100 mg | ORAL | NDC 77333093425

## 2023-09-26 MED ADMIN — Ondansetron HCl Inj 4 MG/2ML (2 MG/ML): 4 mg | INTRAVENOUS | NDC 60505613000

## 2023-09-26 MED FILL — Thiamine Mononitrate Tab 100 MG: 100.0000 mg | ORAL | Qty: 1 | Status: AC

## 2023-09-26 MED FILL — Folic Acid Tab 1 MG: 1.0000 mg | ORAL | Qty: 1 | Status: AC

## 2023-09-26 MED FILL — Ondansetron HCl Inj 4 MG/2ML (2 MG/ML): 4.0000 mg | INTRAMUSCULAR | Qty: 2 | Status: AC

## 2023-09-27 ENCOUNTER — Telehealth: Admitting: Medical-Surgical

## 2023-09-27 DIAGNOSIS — K922 Gastrointestinal hemorrhage, unspecified: Secondary | ICD-10-CM | POA: Diagnosis not present

## 2023-09-27 LAB — CBC
HCT: 30.8 % — ABNORMAL LOW (ref 39.0–52.0)
Hemoglobin: 9.9 g/dL — ABNORMAL LOW (ref 13.0–17.0)
MCH: 32.8 pg (ref 26.0–34.0)
MCHC: 32.1 g/dL (ref 30.0–36.0)
MCV: 102 fL — ABNORMAL HIGH (ref 80.0–100.0)
Platelets: 56 10*3/uL — ABNORMAL LOW (ref 150–400)
RBC: 3.02 MIL/uL — ABNORMAL LOW (ref 4.22–5.81)
RDW: 15.4 % (ref 11.5–15.5)
WBC: 1.8 10*3/uL — ABNORMAL LOW (ref 4.0–10.5)
nRBC: 0 % (ref 0.0–0.2)

## 2023-09-27 LAB — BASIC METABOLIC PANEL WITH GFR
Anion gap: 10 (ref 5–15)
BUN: 9 mg/dL (ref 8–23)
CO2: 26 mmol/L (ref 22–32)
Calcium: 8.6 mg/dL — ABNORMAL LOW (ref 8.9–10.3)
Chloride: 99 mmol/L (ref 98–111)
Creatinine, Ser: 0.66 mg/dL (ref 0.61–1.24)
GFR, Estimated: >60 mL/min (ref >60)
Glucose, Bld: 81 mg/dL (ref 70–99)
Potassium: 3.3 mmol/L — ABNORMAL LOW (ref 3.5–5.1)
Sodium: 135 mmol/L (ref 135–145)

## 2023-09-27 LAB — FOLATE: Folate: 23.9 ng/mL (ref >5.9)

## 2023-09-27 LAB — HEMOGLOBIN AND HEMATOCRIT, BLOOD
HCT: 29.9 % — ABNORMAL LOW (ref 39.0–52.0)
Hemoglobin: 9.5 g/dL — ABNORMAL LOW (ref 13.0–17.0)

## 2023-09-27 LAB — VITAMIN B12: Vitamin B-12: 417 pg/mL (ref 180–914)

## 2023-09-27 MED ORDER — POTASSIUM CHLORIDE CRYS ER 20 MEQ PO TBCR
40.0000 meq | EXTENDED_RELEASE_TABLET | Freq: Once | ORAL | Status: AC
  Administered 2023-09-27: 40 meq via ORAL
  Filled 2023-09-27: qty 2

## 2023-09-27 MED ADMIN — Folic Acid Tab 1 MG: 1 mg | ORAL | NDC 00904722461

## 2023-09-27 MED ADMIN — Thiamine Mononitrate Tab 100 MG: 100 mg | ORAL | NDC 77333093425

## 2023-09-27 NOTE — Telephone Encounter (Signed)
 Please review the telephone encounters from [facility] nurse.

## 2023-09-27 NOTE — Progress Notes (Deleted)
 Virtual Visit via Video Note  I connected with Richard Andrews on 09/27/23 at 10:30 AM EDT by a video enabled telemedicine application and verified that I am speaking with the correct person using two identifiers.   I discussed the limitations of evaluation and management by telemedicine and the availability of in person appointments. The patient expressed understanding and agreed to proceed.  Patient location: home Provider locations: office  Subjective:    CC:   HPI:    Past medical history, Surgical history, Family history not pertinant except as noted below, Social history, Allergies, and medications have been entered into the medical record, reviewed, and corrections made.   Review of Systems: See HPI for pertinent positives and negatives.   Objective:    General: Speaking clearly in complete sentences without any shortness of breath.  Alert and oriented x3.  Normal judgment. No apparent acute distress.  Impression and Recommendations:    No problem-specific Assessment & Plan notes found for this encounter.   I discussed the assessment and treatment plan with the patient. The patient was provided an opportunity to ask questions and all were answered. The patient agreed with the plan and demonstrated an understanding of the instructions.   The patient was advised to call back or seek an in-person evaluation if the symptoms worsen or if the condition fails to improve as anticipated.  *** minutes of non-face-to-face time was provided during this encounter.  No follow-ups on file.  Maryl Snook, DNP, APRN, FNP-BC [facility]

## 2023-09-28 DIAGNOSIS — K922 Gastrointestinal hemorrhage, unspecified: Secondary | ICD-10-CM | POA: Diagnosis not present

## 2023-09-28 LAB — CBC WITH DIFFERENTIAL/PLATELET
Abs Immature Granulocytes: 0.01 10*3/uL (ref 0.00–0.07)
Basophils Absolute: 0 10*3/uL (ref 0.0–0.1)
Basophils Relative: 1 %
Eosinophils Absolute: 0 10*3/uL (ref 0.0–0.5)
Eosinophils Relative: 2 %
HCT: 31 % — ABNORMAL LOW (ref 39.0–52.0)
Hemoglobin: 10.4 g/dL — ABNORMAL LOW (ref 13.0–17.0)
Immature Granulocytes: 0 %
Lymphocytes Relative: 31 %
Lymphs Abs: 0.7 10*3/uL (ref 0.7–4.0)
MCH: 33.5 pg (ref 26.0–34.0)
MCHC: 33.5 g/dL (ref 30.0–36.0)
MCV: 100 fL (ref 80.0–100.0)
Monocytes Absolute: 0.4 10*3/uL (ref 0.1–1.0)
Monocytes Relative: 17 %
Neutro Abs: 1.1 10*3/uL — ABNORMAL LOW (ref 1.7–7.7)
Neutrophils Relative %: 49 %
Platelets: 66 10*3/uL — ABNORMAL LOW (ref 150–400)
RBC: 3.1 MIL/uL — ABNORMAL LOW (ref 4.22–5.81)
RDW: 15 % (ref 11.5–15.5)
WBC: 2.3 10*3/uL — ABNORMAL LOW (ref 4.0–10.5)
nRBC: 0 % (ref 0.0–0.2)

## 2023-09-28 LAB — BASIC METABOLIC PANEL WITH GFR
Anion gap: 10 (ref 5–15)
BUN: 7 mg/dL — ABNORMAL LOW (ref 8–23)
CO2: 21 mmol/L — ABNORMAL LOW (ref 22–32)
Calcium: 8.9 mg/dL (ref 8.9–10.3)
Chloride: 100 mmol/L (ref 98–111)
Creatinine, Ser: 0.75 mg/dL (ref 0.61–1.24)
GFR, Estimated: >60 mL/min (ref >60)
Glucose, Bld: 109 mg/dL — ABNORMAL HIGH (ref 70–99)
Potassium: 4 mmol/L (ref 3.5–5.1)
Sodium: 131 mmol/L — ABNORMAL LOW (ref 135–145)

## 2023-09-28 LAB — IRON AND TIBC
Iron: 81 ug/dL (ref 45–182)
Saturation Ratios: 27 % (ref 17.9–39.5)
TIBC: 304 ug/dL (ref 250–450)
UIBC: 223 ug/dL

## 2023-09-28 LAB — MAGNESIUM: Magnesium: 1.3 mg/dL — ABNORMAL LOW (ref 1.7–2.4)

## 2023-09-28 LAB — PHOSPHORUS: Phosphorus: 2.2 mg/dL — ABNORMAL LOW (ref 2.5–4.6)

## 2023-09-28 LAB — VITAMIN D 25 HYDROXY (VIT D DEFICIENCY, FRACTURES): Vit D, 25-Hydroxy: 33.44 ng/mL (ref 30–100)

## 2023-09-28 MED ORDER — MAGNESIUM SULFATE 4 GM/100ML IV SOLN
4.0000 g | Freq: Once | INTRAVENOUS | Status: AC
  Administered 2023-09-28: 4 g via INTRAVENOUS
  Filled 2023-09-28: qty 100

## 2023-09-28 MED ORDER — SODIUM PHOSPHATES 45 MMOLE/15ML IV SOLN
30.0000 mmol | Freq: Once | INTRAVENOUS | Status: AC
  Administered 2023-09-28: 30 mmol via INTRAVENOUS
  Filled 2023-09-28: qty 10

## 2023-09-28 MED ADMIN — Folic Acid Tab 1 MG: 1 mg | ORAL | NDC 00904722461

## 2023-09-28 MED ADMIN — Thiamine Mononitrate Tab 100 MG: 100 mg | ORAL | NDC 77333093425

## 2023-09-29 DIAGNOSIS — K922 Gastrointestinal hemorrhage, unspecified: Secondary | ICD-10-CM | POA: Diagnosis not present

## 2023-09-29 MED ORDER — PANTOPRAZOLE SODIUM 40 MG PO TBEC
40.0000 mg | DELAYED_RELEASE_TABLET | Freq: Every day | ORAL | Status: DC
  Administered 2023-09-30 – 2023-10-05 (×6): 40 mg via ORAL
  Filled 2023-09-29 (×6): qty 1

## 2023-09-29 MED ORDER — ENSURE ENLIVE PO LIQD
237.0000 mL | Freq: Two times a day (BID) | ORAL | Status: DC
  Administered 2023-09-29 – 2023-10-05 (×6): 237 mL via ORAL

## 2023-09-29 MED ORDER — POTASSIUM CHLORIDE CRYS ER 20 MEQ PO TBCR
40.0000 meq | EXTENDED_RELEASE_TABLET | Freq: Once | ORAL | Status: AC
  Administered 2023-09-29: 40 meq via ORAL
  Filled 2023-09-29: qty 2

## 2023-09-29 MED ADMIN — Folic Acid Tab 1 MG: 1 mg | ORAL | NDC 00904722461

## 2023-09-29 MED ADMIN — Thiamine Mononitrate Tab 100 MG: 100 mg | ORAL | NDC 77333093425

## 2023-09-30 DIAGNOSIS — K922 Gastrointestinal hemorrhage, unspecified: Secondary | ICD-10-CM | POA: Diagnosis not present

## 2023-09-30 MED ADMIN — Folic Acid Tab 1 MG: 1 mg | ORAL | NDC 62584089711

## 2023-09-30 MED ADMIN — Thiamine Mononitrate Tab 100 MG: 100 mg | ORAL | NDC 77333093425

## 2023-10-01 DIAGNOSIS — K922 Gastrointestinal hemorrhage, unspecified: Secondary | ICD-10-CM | POA: Diagnosis not present

## 2023-10-01 MED ORDER — MAGNESIUM SULFATE 2 GM/50ML IV SOLN
2.0000 g | Freq: Once | INTRAVENOUS | Status: AC
  Administered 2023-10-01: 2 g via INTRAVENOUS
  Filled 2023-10-01: qty 50

## 2023-10-01 MED ADMIN — Folic Acid Tab 1 MG: 1 mg | ORAL | NDC 62584089711

## 2023-10-01 MED ADMIN — Thiamine Mononitrate Tab 100 MG: 100 mg | ORAL | NDC 77333093425

## 2023-10-02 DIAGNOSIS — K922 Gastrointestinal hemorrhage, unspecified: Secondary | ICD-10-CM | POA: Diagnosis not present

## 2023-10-02 MED ADMIN — Folic Acid Tab 1 MG: 1 mg | ORAL | NDC 60687068111

## 2023-10-02 MED ADMIN — Thiamine Mononitrate Tab 100 MG: 100 mg | ORAL | NDC 77333093425

## 2023-10-03 DIAGNOSIS — K922 Gastrointestinal hemorrhage, unspecified: Secondary | ICD-10-CM | POA: Diagnosis not present

## 2023-10-03 MED ORDER — POTASSIUM CHLORIDE CRYS ER 20 MEQ PO TBCR
40.0000 meq | EXTENDED_RELEASE_TABLET | Freq: Once | ORAL | Status: AC
  Administered 2023-10-03: 40 meq via ORAL
  Filled 2023-10-03: qty 2

## 2023-10-03 MED ORDER — MAGNESIUM SULFATE 2 GM/50ML IV SOLN
2.0000 g | Freq: Once | INTRAVENOUS | Status: AC
  Administered 2023-10-03: 2 g via INTRAVENOUS
  Filled 2023-10-03: qty 50

## 2023-10-03 MED ADMIN — Folic Acid Tab 1 MG: 1 mg | ORAL | NDC 00904722461

## 2023-10-03 MED ADMIN — Thiamine Mononitrate Tab 100 MG: 100 mg | ORAL | NDC 77333093425

## 2023-10-04 DIAGNOSIS — K922 Gastrointestinal hemorrhage, unspecified: Secondary | ICD-10-CM | POA: Diagnosis not present

## 2023-10-04 MED ORDER — MAGNESIUM SULFATE 2 GM/50ML IV SOLN
2.0000 g | Freq: Once | INTRAVENOUS | Status: AC
  Administered 2023-10-04: 2 g via INTRAVENOUS
  Filled 2023-10-04: qty 50

## 2023-10-04 MED ADMIN — Folic Acid Tab 1 MG: 1 mg | ORAL | NDC 60687068111

## 2023-10-04 MED ADMIN — Thiamine Mononitrate Tab 100 MG: 100 mg | ORAL | NDC 77333093425

## 2023-10-05 ENCOUNTER — Other Ambulatory Visit (HOSPITAL_COMMUNITY): Payer: Self-pay

## 2023-10-05 DIAGNOSIS — K7460 Unspecified cirrhosis of liver: Secondary | ICD-10-CM | POA: Insufficient documentation

## 2023-10-05 DIAGNOSIS — D539 Nutritional anemia, unspecified: Secondary | ICD-10-CM | POA: Insufficient documentation

## 2023-10-05 DIAGNOSIS — K922 Gastrointestinal hemorrhage, unspecified: Secondary | ICD-10-CM | POA: Diagnosis not present

## 2023-10-05 DIAGNOSIS — D61818 Other pancytopenia: Secondary | ICD-10-CM | POA: Insufficient documentation

## 2023-10-05 DIAGNOSIS — F1090 Alcohol use, unspecified, uncomplicated: Secondary | ICD-10-CM | POA: Insufficient documentation

## 2023-10-05 DIAGNOSIS — F1010 Alcohol abuse, uncomplicated: Secondary | ICD-10-CM | POA: Insufficient documentation

## 2023-10-05 MED ORDER — ADULT MULTIVITAMIN W/MINERALS CH
1.0000 | ORAL_TABLET | Freq: Every day | ORAL | 0 refills | Status: AC
  Filled 2023-10-05: qty 30, 30d supply, fill #0

## 2023-10-05 MED ORDER — VITAMIN B-1 100 MG PO TABS
100.0000 mg | ORAL_TABLET | Freq: Every day | ORAL | 0 refills | Status: AC
  Filled 2023-10-05: qty 30, 30d supply, fill #0

## 2023-10-05 MED ORDER — FOLIC ACID 1 MG PO TABS
1.0000 mg | ORAL_TABLET | Freq: Every day | ORAL | 0 refills | Status: AC
  Filled 2023-10-05: qty 30, 30d supply, fill #0

## 2023-10-05 MED ADMIN — Folic Acid Tab 1 MG: 1 mg | ORAL | NDC 60687068111

## 2023-10-05 MED ADMIN — Thiamine Mononitrate Tab 100 MG: 100 mg | ORAL | NDC 77333093425

## 2023-10-06 ENCOUNTER — Emergency Department (HOSPITAL_COMMUNITY)

## 2023-10-06 ENCOUNTER — Encounter (HOSPITAL_COMMUNITY): Payer: Self-pay | Admitting: Internal Medicine

## 2023-10-06 ENCOUNTER — Telehealth: Payer: Self-pay | Admitting: Medical-Surgical

## 2023-10-06 ENCOUNTER — Emergency Department (HOSPITAL_COMMUNITY)
Admission: EM | Admit: 2023-10-06 | Discharge: 2023-10-07 | Disposition: A | Discharge status: Home / Self Care | Attending: Emergency Medicine | Admitting: Emergency Medicine

## 2023-10-06 ENCOUNTER — Other Ambulatory Visit: Payer: Self-pay

## 2023-10-06 DIAGNOSIS — M25511 Pain in right shoulder: Secondary | ICD-10-CM | POA: Insufficient documentation

## 2023-10-06 DIAGNOSIS — I10 Essential (primary) hypertension: Secondary | ICD-10-CM | POA: Insufficient documentation

## 2023-10-06 DIAGNOSIS — F10920 Alcohol use, unspecified with intoxication, uncomplicated: Secondary | ICD-10-CM | POA: Insufficient documentation

## 2023-10-06 NOTE — Discharge Instructions (Signed)
 Your workup was reassuring.  No concerning findings.  Follow-up with your primary care doctor.  Return for emergent symptoms.

## 2023-10-06 NOTE — ED Triage Notes (Signed)
 Pt BIB [facility] from home d/t concerns for ETOH use. EMS called by brother in law who found him laying on the floor. Sts he has been drinking. C/o right shoulder foot pain over the past week. Denies a fall today. In triage pt sts " I dont know what I'm doing here. I should have stayed at home."  EMS VS  BP140/74, HR 72, 97 RA, 113 CBG

## 2023-10-06 NOTE — ED Provider Notes (Signed)
 [place] EMERGENCY DEPARTMENT AT [facility] Provider Note   CSN: [id number] Arrival date & time: 10/06/23  2032     History  Chief Complaint  Patient presents with   Alcohol  Intoxication    Richard Andrews is a 63 y.o. male.  63 year old male presents today for concern of alcohol  intoxication and right shoulder pain.  EMS was called by his brother-in-law who found him on the floor.  Patient is alert and oriented.  He states that he does not want to be here.  He does not appear disoriented.  He does appear clinically sober.  He is agreeable to get a right shoulder x-ray performed as well as a CT head.  The history is provided by the patient. No language interpreter was used.       Home Medications Prior to Admission medications   Medication Sig Start Date End Date Taking? Authorizing Provider  feeding supplement (ENSURE ENLIVE / ENSURE PLUS) LIQD Take 237 mLs by mouth 3 (three) times daily between meals. Patient not taking: Reported on 08/01/2023 07/15/23   Elester Grim, MD  folic acid  (FOLVITE ) 1 MG tablet Take 1 tablet (1 mg total) by mouth daily. 07/16/23   Pahwani, Regino Caprio, MD  folic acid  (FOLVITE ) 1 MG tablet Take 1 tablet (1 mg total) by mouth daily. 10/06/23   Verlyn Goad, MD  lactulose  (CHRONULAC ) 10 GM/15ML solution Take 30 mLs (20 g total) by mouth 2 (two) times daily. Patient not taking: Reported on 08/01/2023 07/15/23   Elester Grim, MD  magnesium  oxide (MAG-OX) 400 (240 Mg) MG tablet Take 1 tablet (400 mg total) by mouth daily. 07/16/23   Pahwani, Rinka R, MD  Multiple Vitamin (MULTIVITAMIN WITH MINERALS) TABS tablet Take 1 tablet by mouth daily. 07/16/23   Pahwani, Rinka R, MD  Multiple Vitamin (MULTIVITAMIN WITH MINERALS) TABS tablet Take 1 tablet by mouth daily. 10/06/23   Verlyn Goad, MD  oxyCODONE  (OXY IR/ROXICODONE ) 5 MG immediate release tablet Take 1 tablet (5 mg total) by mouth every 6 (six) hours as needed for breakthrough pain. 08/09/23    Gherghe, Costin M, MD  pantoprazole  (PROTONIX ) 40 MG tablet Take 1 tablet (40 mg total) by mouth daily. 08/10/23   Gherghe, Costin M, MD  thiamine  (VITAMIN B-1) 100 MG tablet Take 1 tablet (100 mg total) by mouth daily. 07/16/23   Pahwani, Regino Caprio, MD  thiamine  (VITAMIN B-1) 100 MG tablet Take 1 tablet (100 mg total) by mouth daily. 10/06/23   Verlyn Goad, MD      Allergies    Patient has no known allergies.    Review of Systems   Review of Systems  Constitutional:  Negative for chills and fever.  Musculoskeletal:  Positive for arthralgias.  Neurological:  Negative for syncope and headaches.  All other systems reviewed and are negative.   Physical Exam Updated Vital Signs BP 90/63 (BP Location: Left Arm)   Pulse 78   Temp 97.6 F (36.4 C) (Oral)   Resp 18   SpO2 100%  Physical Exam Vitals and nursing note reviewed.  Constitutional:      General: He is not in acute distress.    Appearance: Normal appearance. He is not ill-appearing.  HENT:     Head: Normocephalic and atraumatic.     Nose: Nose normal.  Eyes:     Conjunctiva/sclera: Conjunctivae normal.  Pulmonary:     Effort: Pulmonary effort is normal. No respiratory distress.  Musculoskeletal:  General: No swelling, tenderness or deformity. Normal range of motion.     Comments: No C-spine, T-spine, L-spine tenderness to palpation.  No obvious injury to the head.  Both shoulders without tenderness palpation and good range of motion.  There is a small abrasion noted over the right shoulder.  Right elbow with good range of motion and without tenderness palpation.  Neurovascularly intact in the right upper extremity.  Skin:    Findings: No rash.  Neurological:     Mental Status: He is alert.     ED Results / Procedures / Treatments   Labs (all labs ordered are listed, but only abnormal results are displayed) Labs Reviewed - No data to display  EKG None  Radiology CT Head Wo Contrast Result Date:  10/06/2023 CLINICAL DATA:  Polytrauma, blunt concerns for ETOH use. EMS called by brother in law who found him laying on the floor. Sts he has been drinking. C/o right shoulder foot pain over the past week. Denies a fall today. EXAM: CT HEAD WITHOUT CONTRAST TECHNIQUE: Contiguous axial images were obtained from the base of the skull through the vertex without intravenous contrast. RADIATION DOSE REDUCTION: This exam was performed according to the departmental dose-optimization program which includes automated exposure control, adjustment of the mA and/or kV according to patient size and/or use of iterative reconstruction technique. COMPARISON:  CT head 07/31/2023 FINDINGS: Brain: Cerebral ventricle sizes are concordant with the degree of cerebral volume loss. Patchy and confluent areas of decreased attenuation are noted throughout the deep and periventricular white matter of the cerebral hemispheres bilaterally, compatible with chronic microvascular ischemic disease. Left arachnoid cyst. No evidence of large-territorial acute infarction. No parenchymal hemorrhage. No mass lesion. No acute extra-axial collection. 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 No mass effect or midline shift. No hydrocephalus. Basilar cisterns are patent. Vascular: No hyperdense vessel. Prior right middle meningeal artery embolization. Skull: No acute fracture or focal lesion. Sinuses/Orbits: Paranasal sinuses and mastoid air cells are clear. The orbits are unremarkable. Other: None. IMPRESSION: 1. No acute intracranial abnormality. 2. 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 Electronically Signed   By: Morgane  Naveau M.D.   On: 10/06/2023 21:35   DG Shoulder Right Result Date: 10/06/2023 CLINICAL DATA:  fall EXAM: RIGHT SHOULDER - 2+ VIEW COMPARISON:  X-ray right shoulder 07/31/2023 FINDINGS: Plate and screw fixation of the right proximal humerus. There is no evidence of fracture or dislocation. There is no evidence of  arthropathy or other focal bone abnormality. Soft tissues are unremarkable. Old healed right rib fractures. IMPRESSION: No acute displaced fracture or dislocation. Electronically Signed   By: Morgane  Naveau M.D.   On: 10/06/2023 21:14    Procedures Procedures    Medications Ordered in ED Medications - No data to display  ED Course/ Medical Decision Making/ A&P                                 Medical Decision Making Amount and/or Complexity of Data Reviewed Radiology: ordered.   Medical Decision Making / ED Course   This patient presents to the ED for concern of fall, right shoulder pain, this involves an extensive number of treatment options, and is a complaint that carries with it a high risk of complications and morbidity.  The differential diagnosis includes acute intracranial injury, right shoulder strain, fracture  MDM: 63 year old male presents today for concern of alcohol  intoxication.  However he does  appear clinically sober at this time and ambulates, converses without difficulty.  He is alert and oriented.  He is agreeable for CT head, and right shoulder x-ray.  CT head with chronic stable subdural hematoma, x-ray without acute bony abnormality.  Patient washed in the emergency department.  He is ambulating and eating and drinking without any issue.  He is stable for discharge.  Discharged in stable condition.  Return precaution discussed.   Additional history obtained: -Additional history obtained from EMS personnel -External records from outside source obtained and reviewed including: Chart review including previous notes, labs, imaging, consultation notes   Lab Tests: -I ordered, reviewed, and interpreted labs.   The pertinent results include:   Labs Reviewed - No data to display    EKG  EKG Interpretation Date/Time:    Ventricular Rate:    PR Interval:    QRS Duration:    QT Interval:    QTC Calculation:   R Axis:      Text Interpretation:            Imaging Studies ordered: I ordered imaging studies including CT head, right shoulder x-ray I independently visualized and interpreted imaging. I agree with the radiologist interpretation   Medicines ordered and prescription drug management: No orders of the defined types were placed in this encounter.   -I have reviewed the patients home medicines and have made adjustments as needed  Social Determinants of Health:  Factors impacting patients care include: Does not have good family support at home, lives alone   Reevaluation: After the interventions noted above, I reevaluated the patient and found that they have :improved  Co morbidities that complicate the patient evaluation  Past Medical History:  Diagnosis Date   Allergy    Arthritis    Distal radius fracture, left    Hepatic steatosis    History of kidney stones    Hypertension    Kidney stone 02/16/2023   Substance abuse (HCC)       Dispostion: Discharged in stable condition.  Return precaution discussed.  Patient voices understanding and is in agreement with the plan.   Final Clinical Impression(s) / ED Diagnoses Final diagnoses:  Alcoholic intoxication without complication (HCC)  Acute pain of right shoulder    Rx / DC Orders ED Discharge Orders     None         Lucina Sabal, PA-C 10/06/23 2319    Onetha Bile, MD 10/09/23 1506

## 2023-10-06 NOTE — Telephone Encounter (Signed)
 Copied from CRM [id number]. Topic: Referral - Status >> Oct 06, 2023  3:01 PM Retta Caster wrote: Reason for CRM: Evalyn Hillier from [facility] and hospice request Verbal order for resumption of care on 04/29. Needs call back [phone]

## 2023-10-06 NOTE — ED Notes (Addendum)
 Pt states he has no ride home. Pt brother-in-law contacted and states he cannot take the pt home at this time but might be able to in the morning. Brother-in-law also states none of the other emergency contacts would be able to either.

## 2023-10-09 NOTE — Telephone Encounter (Signed)
 Left message for a return call

## 2023-10-10 ENCOUNTER — Inpatient Hospital Stay (HOSPITAL_COMMUNITY)
Admission: EM | Admit: 2023-10-10 | Discharge: 2023-10-20 | DRG: 521 | Disposition: A | Discharge status: Skilled Nursing Facility | Attending: Internal Medicine | Admitting: Internal Medicine

## 2023-10-10 ENCOUNTER — Emergency Department (HOSPITAL_COMMUNITY)

## 2023-10-10 ENCOUNTER — Telehealth: Payer: Self-pay

## 2023-10-10 ENCOUNTER — Encounter (HOSPITAL_COMMUNITY): Payer: Self-pay | Admitting: Internal Medicine

## 2023-10-10 ENCOUNTER — Other Ambulatory Visit: Payer: Self-pay

## 2023-10-10 DIAGNOSIS — S72012A Unspecified intracapsular fracture of left femur, initial encounter for closed fracture: Principal | ICD-10-CM | POA: Diagnosis present

## 2023-10-10 DIAGNOSIS — R531 Weakness: Secondary | ICD-10-CM

## 2023-10-10 DIAGNOSIS — S72002S Fracture of unspecified part of neck of left femur, sequela: Secondary | ICD-10-CM | POA: Diagnosis not present

## 2023-10-10 DIAGNOSIS — Z751 Person awaiting admission to adequate facility elsewhere: Secondary | ICD-10-CM | POA: Diagnosis not present

## 2023-10-10 DIAGNOSIS — E871 Hypo-osmolality and hyponatremia: Secondary | ICD-10-CM | POA: Diagnosis present

## 2023-10-10 DIAGNOSIS — Z681 Body mass index (BMI) 19 or less, adult: Secondary | ICD-10-CM | POA: Diagnosis not present

## 2023-10-10 DIAGNOSIS — R296 Repeated falls: Secondary | ICD-10-CM | POA: Diagnosis present

## 2023-10-10 DIAGNOSIS — K7460 Unspecified cirrhosis of liver: Secondary | ICD-10-CM | POA: Diagnosis present

## 2023-10-10 DIAGNOSIS — F10230 Alcohol dependence with withdrawal, uncomplicated: Secondary | ICD-10-CM | POA: Diagnosis present

## 2023-10-10 DIAGNOSIS — F10930 Alcohol use, unspecified with withdrawal, uncomplicated: Secondary | ICD-10-CM | POA: Diagnosis not present

## 2023-10-10 DIAGNOSIS — F10939 Alcohol use, unspecified with withdrawal, unspecified: Secondary | ICD-10-CM | POA: Diagnosis present

## 2023-10-10 DIAGNOSIS — R9431 Abnormal electrocardiogram [ECG] [EKG]: Secondary | ICD-10-CM | POA: Diagnosis present

## 2023-10-10 DIAGNOSIS — F10931 Alcohol use, unspecified with withdrawal delirium: Secondary | ICD-10-CM | POA: Diagnosis not present

## 2023-10-10 DIAGNOSIS — R7989 Other specified abnormal findings of blood chemistry: Secondary | ICD-10-CM

## 2023-10-10 DIAGNOSIS — Z5982 Transportation insecurity: Secondary | ICD-10-CM

## 2023-10-10 DIAGNOSIS — Z8249 Family history of ischemic heart disease and other diseases of the circulatory system: Secondary | ICD-10-CM

## 2023-10-10 DIAGNOSIS — R627 Adult failure to thrive: Secondary | ICD-10-CM | POA: Diagnosis present

## 2023-10-10 DIAGNOSIS — I10 Essential (primary) hypertension: Secondary | ICD-10-CM | POA: Diagnosis present

## 2023-10-10 DIAGNOSIS — D6859 Other primary thrombophilia: Secondary | ICD-10-CM | POA: Diagnosis present

## 2023-10-10 DIAGNOSIS — F10229 Alcohol dependence with intoxication, unspecified: Secondary | ICD-10-CM | POA: Diagnosis present

## 2023-10-10 DIAGNOSIS — F1090 Alcohol use, unspecified, uncomplicated: Secondary | ICD-10-CM | POA: Insufficient documentation

## 2023-10-10 DIAGNOSIS — E876 Hypokalemia: Secondary | ICD-10-CM | POA: Diagnosis present

## 2023-10-10 DIAGNOSIS — R636 Underweight: Secondary | ICD-10-CM | POA: Diagnosis present

## 2023-10-10 DIAGNOSIS — E860 Dehydration: Secondary | ICD-10-CM | POA: Diagnosis present

## 2023-10-10 DIAGNOSIS — S72009A Fracture of unspecified part of neck of unspecified femur, initial encounter for closed fracture: Secondary | ICD-10-CM | POA: Diagnosis not present

## 2023-10-10 DIAGNOSIS — I6203 Nontraumatic chronic subdural hemorrhage: Secondary | ICD-10-CM | POA: Diagnosis present

## 2023-10-10 DIAGNOSIS — D500 Iron deficiency anemia secondary to blood loss (chronic): Secondary | ICD-10-CM | POA: Diagnosis present

## 2023-10-10 DIAGNOSIS — Z635 Disruption of family by separation and divorce: Secondary | ICD-10-CM

## 2023-10-10 DIAGNOSIS — E872 Acidosis, unspecified: Secondary | ICD-10-CM | POA: Diagnosis present

## 2023-10-10 DIAGNOSIS — F17210 Nicotine dependence, cigarettes, uncomplicated: Secondary | ICD-10-CM | POA: Diagnosis present

## 2023-10-10 DIAGNOSIS — Y908 Blood alcohol level of 240 mg/100 ml or more: Secondary | ICD-10-CM | POA: Diagnosis present

## 2023-10-10 LAB — I-STAT CG4 LACTIC ACID, ED
Lactic Acid, Venous: 4.9 mmol/L (ref 0.5–1.9)
Lactic Acid, Venous: 4 mmol/L (ref 0.5–1.9)

## 2023-10-10 LAB — CBC WITH DIFFERENTIAL/PLATELET
Abs Immature Granulocytes: 0.03 10*3/uL (ref 0.00–0.07)
Basophils Absolute: 0 10*3/uL (ref 0.0–0.1)
Basophils Relative: 0 %
Eosinophils Absolute: 0 10*3/uL (ref 0.0–0.5)
Eosinophils Relative: 0 %
HCT: 29.4 % — ABNORMAL LOW (ref 39.0–52.0)
Hemoglobin: 9.9 g/dL — ABNORMAL LOW (ref 13.0–17.0)
Immature Granulocytes: 1 %
Lymphocytes Relative: 20 %
Lymphs Abs: 1.1 10*3/uL (ref 0.7–4.0)
MCH: 33 pg (ref 26.0–34.0)
MCHC: 33.7 g/dL (ref 30.0–36.0)
MCV: 98 fL (ref 80.0–100.0)
Monocytes Absolute: 0.6 10*3/uL (ref 0.1–1.0)
Monocytes Relative: 11 %
Neutro Abs: 3.6 10*3/uL (ref 1.7–7.7)
Neutrophils Relative %: 68 %
Platelets: 224 10*3/uL (ref 150–400)
RBC: 3 MIL/uL — ABNORMAL LOW (ref 4.22–5.81)
RDW: 15.5 % (ref 11.5–15.5)
WBC: 5.4 10*3/uL (ref 4.0–10.5)
nRBC: 0 % (ref 0.0–0.2)

## 2023-10-10 LAB — COMPREHENSIVE METABOLIC PANEL WITH GFR
ALT: 18 U/L (ref 0–44)
AST: 35 U/L (ref 15–41)
Albumin: 3.4 g/dL — ABNORMAL LOW (ref 3.5–5.0)
Alkaline Phosphatase: 110 U/L (ref 38–126)
Anion gap: 13 (ref 5–15)
BUN: 7 mg/dL — ABNORMAL LOW (ref 8–23)
CO2: 21 mmol/L — ABNORMAL LOW (ref 22–32)
Calcium: 8.5 mg/dL — ABNORMAL LOW (ref 8.9–10.3)
Chloride: 101 mmol/L (ref 98–111)
Creatinine, Ser: 0.53 mg/dL — ABNORMAL LOW (ref 0.61–1.24)
GFR, Estimated: >60 mL/min (ref >60)
Glucose, Bld: 116 mg/dL — ABNORMAL HIGH (ref 70–99)
Potassium: 3.3 mmol/L — ABNORMAL LOW (ref 3.5–5.1)
Sodium: 135 mmol/L (ref 135–145)
Total Bilirubin: 0.8 mg/dL (ref 0.0–1.2)
Total Protein: 7.2 g/dL (ref 6.5–8.1)

## 2023-10-10 LAB — BLOOD GAS, VENOUS
Acid-Base Excess: 4.7 mmol/L — ABNORMAL HIGH (ref 0.0–2.0)
Bicarbonate: 28.1 mmol/L — ABNORMAL HIGH (ref 20.0–28.0)
O2 Saturation: 79.7 %
Patient temperature: 37
pCO2, Ven: 36 mmHg — ABNORMAL LOW (ref 44–60)
pH, Ven: 7.5 — ABNORMAL HIGH (ref 7.25–7.43)
pO2, Ven: 48 mmHg — ABNORMAL HIGH (ref 32–45)

## 2023-10-10 LAB — MAGNESIUM: Magnesium: 1.3 mg/dL — ABNORMAL LOW (ref 1.7–2.4)

## 2023-10-10 LAB — ETHANOL: Alcohol, Ethyl (B): 248 mg/dL — ABNORMAL HIGH (ref <15)

## 2023-10-10 LAB — AMMONIA: Ammonia: 25 umol/L (ref 9–35)

## 2023-10-10 LAB — TROPONIN I (HIGH SENSITIVITY)
Troponin I (High Sensitivity): 3 ng/L (ref <18)
Troponin I (High Sensitivity): 3 ng/L (ref <18)

## 2023-10-10 MED ORDER — LACTATED RINGERS IV BOLUS
1000.0000 mL | Freq: Once | INTRAVENOUS | Status: AC
  Administered 2023-10-10: 1000 mL via INTRAVENOUS

## 2023-10-10 MED ORDER — FOLIC ACID 1 MG PO TABS
1.0000 mg | ORAL_TABLET | Freq: Every day | ORAL | Status: DC
  Administered 2023-10-11 – 2023-10-20 (×9): 1 mg via ORAL
  Filled 2023-10-10 (×9): qty 1

## 2023-10-10 MED ORDER — THIAMINE MONONITRATE 100 MG PO TABS
100.0000 mg | ORAL_TABLET | Freq: Every day | ORAL | Status: DC
  Administered 2023-10-11 – 2023-10-20 (×7): 100 mg via ORAL
  Filled 2023-10-10 (×9): qty 1

## 2023-10-10 MED ORDER — THIAMINE HCL 100 MG/ML IJ SOLN
100.0000 mg | Freq: Once | INTRAMUSCULAR | Status: AC
  Administered 2023-10-10: 100 mg via INTRAVENOUS
  Filled 2023-10-10: qty 2

## 2023-10-10 MED ORDER — SODIUM CHLORIDE 0.9 % IV SOLN: INTRAVENOUS | Status: DC

## 2023-10-10 MED ORDER — MAGNESIUM SULFATE 2 GM/50ML IV SOLN
2.0000 g | Freq: Once | INTRAVENOUS | Status: AC
  Administered 2023-10-10: 2 g via INTRAVENOUS
  Filled 2023-10-10: qty 50

## 2023-10-10 MED ORDER — LACTATED RINGERS IV BOLUS: 1000.0000 mL | Freq: Once | INTRAVENOUS | Status: DC

## 2023-10-10 MED ORDER — LORAZEPAM 2 MG/ML IJ SOLN: 1.0000 mg | INTRAMUSCULAR | Status: AC | PRN

## 2023-10-10 MED ORDER — LORAZEPAM 2 MG/ML IJ SOLN
2.0000 mg | Freq: Once | INTRAMUSCULAR | Status: AC
  Administered 2023-10-10: 2 mg via INTRAVENOUS
  Filled 2023-10-10: qty 1

## 2023-10-10 MED ORDER — POTASSIUM CHLORIDE 10 MEQ/100ML IV SOLN
10.0000 meq | INTRAVENOUS | Status: AC
  Administered 2023-10-10 (×3): 10 meq via INTRAVENOUS
  Filled 2023-10-10 (×3): qty 100

## 2023-10-10 MED ORDER — CHLORDIAZEPOXIDE HCL 25 MG PO CAPS
50.0000 mg | ORAL_CAPSULE | Freq: Once | ORAL | Status: AC
  Administered 2023-10-10: 50 mg via ORAL
  Filled 2023-10-10: qty 2

## 2023-10-10 MED ORDER — THIAMINE HCL 100 MG/ML IJ SOLN
100.0000 mg | Freq: Every day | INTRAMUSCULAR | Status: DC
  Administered 2023-10-12 – 2023-10-19 (×3): 100 mg via INTRAVENOUS
  Filled 2023-10-10 (×4): qty 2

## 2023-10-10 MED ORDER — LORAZEPAM 1 MG PO TABS
1.0000 mg | ORAL_TABLET | ORAL | Status: AC | PRN
  Administered 2023-10-11: 3 mg via ORAL
  Filled 2023-10-10: qty 3

## 2023-10-10 MED ORDER — ADULT MULTIVITAMIN W/MINERALS CH
1.0000 | ORAL_TABLET | Freq: Every day | ORAL | Status: DC
  Administered 2023-10-11 – 2023-10-20 (×9): 1 via ORAL
  Filled 2023-10-10 (×9): qty 1

## 2023-10-10 NOTE — ED Triage Notes (Signed)
 Patient brought in by [facility] EMS. Per EMS patient brother called due to patient laying in bed in supine position since Sunday covered in feces and urine. Patient is known to come to the hospital get the help he needs to feel better and then go back home and not take care of himself. Patient lives alone and brother comes to check on him periodically. Patient feeling weak and achy. Aox4. Received 600 mls of LR with medics. CO2 27  HR 110 100% room air

## 2023-10-10 NOTE — H&P (Signed)
 History and Physical    Richard Andrews MWU:[id number] DOB: [birth year] DOA: 10/10/2023  PCP: Cherre Cornish, NP  Patient coming from: Home  Chief Complaint: Generalized weakness  HPI: Richard Andrews is a 63 y.o. male with medical history significant of hypertension, alcohol use disorder with history of severe alcohol withdrawal, cirrhosis, traumatic subdural hematoma.  Patient was recently admitted to the hospital 4/15-4/24 for acute lower GI bleed and anemia.  GI was consulted and deferred endoscopic evaluation.  Lower GI bleed resolved spontaneously.  Outpatient hematology follow-up was recommended for pancytopenia.  Patient could not be discharged to SNF so he was discharged home with home health services.  The day after discharge 4/25 patient visited the ED for alcohol intoxication and right shoulder pain.  CT head was showing chronic stable right subdural hematoma and no acute findings.  X-ray of shoulder was negative for acute bony abnormality.  Patient was discharged from the ED.  Patient presents to the ED today via EMS for evaluation of generalized weakness.  Patient was laying in his bed for the past 2 days as he was too weak to get up.  He had not eaten or drank anything over that time.  His brother called EMS today and when they arrived patient was found covered in urine and stool.  He was tachycardic and was given 600 mL IV fluids prior to arrival.  Patient slightly tachycardic on arrival to the ED but remainder of vital signs stable.  Labs showing no leukocytosis, hemoglobin 9.9 (stable), platelet count normal, potassium 3.3, bicarb 21, glucose 116, BUN 7, creatinine 0.5, UA and UDS pending, ammonia level normal, ethanol level 248 (previously as high as 370s), troponin negative x 2, magnesium  1.3, lactic acid 4.9, VBG with pH 7.5.  Chest x-ray showing no active disease.  CT head showing chronic right subdural hematoma and no acute intracranial abnormality. Patient was given Librium  50 mg, Ativan   2 mg, IV thiamine  100 mg, IV mag 2 g, IV potassium 10 mEq x 3, and 1 L LR bolus in the ED.  [facility] called to admit.    ED Course: ***  Review of Systems:  ROS  Past Medical History:  Diagnosis Date   Allergy    Arthritis    Distal radius fracture, left    Hepatic steatosis    History of kidney stones    Hypertension    Kidney stone 02/16/2023   Substance abuse [facility])     Past Surgical History:  Procedure Laterality Date   BIOPSY  01/22/2021   Procedure: BIOPSY;  Surgeon: Annis Kinder, DO;  Location: [facility] END[facility]COPY;  Service: Gastroenterology;;   CYSTOSCOPY W/ URETERAL STENT PLACEMENT Bilateral 01/23/2023   Procedure: 1. Cystoscopy 2. Bilateral  retrograde pyelogram with interpretation 3. bilateral ureteral stent placement (6x24 cm on the right, 6x26 cm on the left) 4. Fluoroscopy <1 hour with intraoperative interpretation;  Surgeon: Mallie Seal, MD;  Location: [facility] OR;  Service: Urology;  Laterality: Bilateral;   CYSTOSCOPY/URETEROSCOPY/HOLMIUM LASER/STENT PLACEMENT Bilateral 03/21/2023   Procedure: BILATERAL URETEROSCOPY/HOLMIUM LASER/STENT PLACEMENT;  Surgeon: Mallie Seal, MD;  Location: [facility] ORS;  Service: Urology;  Laterality: Bilateral;   ESOPHAGOGASTRODUODENOSCOPY (EGD) WITH PROPOFOL  N/A 01/22/2021   Procedure: ESOPHAGOGASTRODUODENOSCOPY (EGD) WITH PROPOFOL ;  Surgeon: Annis Kinder, DO;  Location: [facility] END[facility]COPY;  Service: Gastroenterology;  Laterality: N/A;   I & D EXTREMITY Right 01/10/2020   Procedure: ORIF RIGHT WRIST;  Surgeon: Ronn Cohn, MD;  Location: [facility] OR;  Service: Orthopedics;  Laterality:  Right;   IR ANGIO EXTERNAL CAROTID SEL EXT CAROTID UNI R MOD SED  10/04/2022   IR ANGIO INTRA EXTRACRAN SEL INTERNAL CAROTID UNI R MOD SED  09/30/2022   IR ANGIOGRAM FOLLOW UP STUDY  09/30/2022   IR NEURO EACH ADD'L AFTER BASIC UNI RIGHT (MS)  10/04/2022   IR TRANSCATH/EMBOLIZ  09/30/2022   NO PAST SURGERIES     OPEN REDUCTION INTERNAL FIXATION (ORIF) DISTAL RADIAL  FRACTURE Left 01/28/2015   Procedure: OPEN REDUCTION INTERNAL FIXATION (ORIF) LEFT DISTAL RADIAL FRACTURE;  Surgeon: Wes Hamman, MD;  Location: [facility];  Service: Orthopedics;  Laterality: Left;   ORIF HUMERUS FRACTURE Right 09/27/2022   Procedure: OPEN REDUCTION INTERNAL FIXATION (ORIF) PROXIMAL HUMERUS FRACTURE;  Surgeon: Osa Blase, MD;  Location: [facility] OR;  Service: Orthopedics;  Laterality: Right;   RADIOLOGY WITH ANESTHESIA N/A 09/30/2022   Procedure: IR WITH ANESTHESIA MMA EMBOLIZATION;  Surgeon: Radiologist, Medication, MD;  Location: [facility] OR;  Service: Radiology;  Laterality: N/A;   TONSILLECTOMY       reports that he has been smoking cigarettes. He started smoking about 20 years ago. He has a 10.2 pack-year smoking history. He has never used smokeless tobacco. He reports current alcohol use of about 16.0 - 20.0 standard drinks of alcohol per week. He reports that he does not use drugs.  No Known Allergies  Family History  Problem Relation Age of Onset   Heart disease Mother    Hyperlipidemia Mother    Skin cancer Mother    Cancer Father    Multiple myeloma Sister    Throat cancer Sister     Prior to Admission medications   Medication Sig Start Date End Date Taking? Authorizing Provider  feeding supplement (ENSURE ENLIVE / ENSURE PLUS) LIQD Take 237 mLs by mouth 3 (three) times daily between meals. Patient not taking: Reported on 08/01/2023 07/15/23   Elester Grim, MD  folic acid  (FOLVITE ) 1 MG tablet Take 1 tablet (1 mg total) by mouth daily. 07/16/23   Pahwani, Regino Caprio, MD  folic acid  (FOLVITE ) 1 MG tablet Take 1 tablet (1 mg total) by mouth daily. 10/06/23   Verlyn Goad, MD  lactulose  (CHRONULAC ) 10 GM/15ML solution Take 30 mLs (20 g total) by mouth 2 (two) times daily. Patient not taking: Reported on 08/01/2023 07/15/23   Elester Grim, MD  magnesium  oxide (MAG-OX) 400 (240 Mg) MG tablet Take 1 tablet (400 mg total) by mouth daily. 07/16/23   Pahwani, Rinka  R, MD  Multiple Vitamin (MULTIVITAMIN WITH MINERALS) TABS tablet Take 1 tablet by mouth daily. 07/16/23   Pahwani, Rinka R, MD  Multiple Vitamin (MULTIVITAMIN WITH MINERALS) TABS tablet Take 1 tablet by mouth daily. 10/06/23   Verlyn Goad, MD  oxyCODONE  (OXY IR/ROXICODONE ) 5 MG immediate release tablet Take 1 tablet (5 mg total) by mouth every 6 (six) hours as needed for breakthrough pain. 08/09/23   Gherghe, Costin M, MD  pantoprazole  (PROTONIX ) 40 MG tablet Take 1 tablet (40 mg total) by mouth daily. 08/10/23   Gherghe, Costin M, MD  thiamine  (VITAMIN B-1) 100 MG tablet Take 1 tablet (100 mg total) by mouth daily. 07/16/23   Pahwani, Regino Caprio, MD  thiamine  (VITAMIN B-1) 100 MG tablet Take 1 tablet (100 mg total) by mouth daily. 10/06/23   Verlyn Goad, MD    Physical Exam: Vitals:   10/10/23 1916 10/10/23 1930 10/10/23 2239  BP: 136/89 119/76 (!) 156/93  Pulse: (!) 104 Aaron Aas)  102 91  Resp: 13 15 19   Temp: 98.8 F (37.1 C)    TempSrc: Oral    SpO2: 100% 98% 99%    Physical Exam  Labs on Admission: I have personally reviewed following labs and imaging studies  CBC: Recent Labs  Lab 10/04/23 0445 10/05/23 0439 10/10/23 2010  WBC 2.1* 1.7* 5.4  NEUTROABS 0.7* 0.9* 3.6  HGB 9.1* 9.2* 9.9*  HCT 29.6* 30.2* 29.4*  MCV 106.1* 106.7* 98.0  PLT 131* 135* 224   Basic Metabolic Panel: Recent Labs  Lab 10/04/23 0445 10/10/23 2010  NA 138 135  K 3.8 3.3*  CL 107 101  CO2 22 21*  GLUCOSE 100* 116*  BUN 16 7*  CREATININE 0.69 0.53*  CALCIUM  9.2 8.5*  MG 1.7 1.3*  PHOS 3.5  --    GFR: CrCl cannot be calculated (Unknown ideal weight.). Liver Function Tests: Recent Labs  Lab 10/10/23 2010  AST 35  ALT 18  ALKPHOS 110  BILITOT 0.8  PROT 7.2  ALBUMIN  3.4*   No results for input(s): "LIPASE", "AMYLASE" in the last 168 hours. Recent Labs  Lab 10/10/23 2009  AMMONIA 25   Coagulation Profile: No results for input(s): "INR", "PROTIME" in the last 168 hours. Cardiac  Enzymes: No results for input(s): "CKTOTAL", "CKMB", "CKMBINDEX", "TROPONINI" in the last 168 hours. BNP (last 3 results) No results for input(s): "PROBNP" in the last 8760 hours. HbA1C: No results for input(s): "HGBA1C" in the last 72 hours. CBG: No results for input(s): "GLUCAP" in the last 168 hours. Lipid Profile: No results for input(s): "CHOL", "HDL", "LDLCALC", "TRIG", "CHOLHDL", "LDLDIRECT" in the last 72 hours. Thyroid Function Tests: No results for input(s): "TSH", "T4TOTAL", "FREET4", "T3FREE", "THYROIDAB" in the last 72 hours. Anemia Panel: No results for input(s): "VITAMINB12", "FOLATE", "FERRITIN", "TIBC", "IRON", "RETICCTPCT" in the last 72 hours. Urine analysis:    Component Value Date/Time   COLORURINE YELLOW 08/01/2023 0235   APPEARANCEUR CLEAR 08/01/2023 0235   LABSPEC 1.010 08/01/2023 0235   PHURINE 6.0 08/01/2023 0235   GLUCOSEU NEGATIVE 08/01/2023 0235   HGBUR NEGATIVE 08/01/2023 0235   BILIRUBINUR NEGATIVE 08/01/2023 0235   KETONESUR NEGATIVE 08/01/2023 0235   PROTEINUR NEGATIVE 08/01/2023 0235   NITRITE NEGATIVE 08/01/2023 0235   LEUKOCYTESUR NEGATIVE 08/01/2023 0235    Radiological Exams on Admission: CT HEAD WO CONTRAST Result Date: 10/10/2023 CLINICAL DATA:  Mental status change EXAM: CT HEAD WITHOUT CONTRAST TECHNIQUE: Contiguous axial images were obtained from the base of the skull through the vertex without intravenous contrast. RADIATION DOSE REDUCTION: This exam was performed according to the departmental dose-optimization program which includes automated exposure control, adjustment of the mA and/or kV according to patient size and/or use of iterative reconstruction technique. COMPARISON:  CT head 10/06/2023. FINDINGS: Brain: Redemonstrated subdural hematoma over the right cerebral convexity measuring up to 5 mm in thickness which is unchanged since 10/06/2023. Additional small focus of subdural hemorrhage along the anterior falx is unchanged. No acute  intracranial hemorrhage. No significant mass effect. No midline shift. No CT evidence to suggest completed large territory infarct. 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 Chronic microvascular ischemic changes and similar degree of parenchymal volume loss. Left middle cranial fossa arachnoid cyst. Vascular: Atherosclerosis of the carotid siphons. No hyperdense vessel. 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 Skull: Normal. Negative for fracture or focal lesion. Sinuses/Orbits: Orbits are symmetric. Mucosal thickening and possible mucous retention cyst in the left posterior ethmoid air cells. Mild mucosal  thickening in the right ethmoid sinus. Other: Mastoid air cells are clear. IMPRESSION: No CT evidence of acute intracranial abnormality. Similar chronic right subdural hematoma. No significant mass effect or midline shift. Additional chronic changes as above. Electronically Signed   By: Denny Flack M.D.   On: 10/10/2023 20:45   DG Chest Port 1 View Result Date: 10/10/2023 CLINICAL DATA:  Generalized weakness. EXAM: PORTABLE CHEST 1 VIEW COMPARISON:  09/26/2023 FINDINGS: Heart size and pulmonary vascularity are normal. Lungs are clear. Soft tissue attenuation over the right apex. No airspace disease or consolidation in the lungs. No pleural effusion or pneumothorax. Mediastinal contours appear intact. Multiple old bilateral rib fractures. Postoperative changes in the right shoulder. IMPRESSION: No active disease. Electronically Signed   By: Boyce Byes M.D.   On: 10/10/2023 20:00    EKG: Independently reviewed.  Sinus tachycardia, borderline T wave abnormalities, artifact, QTc 480.  No significant change compared to previous EKGs.  Assessment and Plan  Alcohol withdrawal Alcohol use disorder, severe, dependence Patient presented with slight tachycardia and tremulousness.  Ethanol level 248 (previously as high as 370s).  Placed on CIWA  protocol; Ativan  as needed.  Thiamine , folate, and multivitamin.  Continue Librium  taper.  Elevated lactate Likely due to chronic alcohol use and dehydration.  No fever or leukocytosis to suggest infection/sepsis.  Chest x-ray not suggestive of pneumonia.  UA pending to rule out UTI.  Continue IV fluid hydration and trend lactate.  Metabolic alkalosis  Hypokalemia and hypomagnesemia Mild QT prolongation Replace electrolytes and monitor labs.  Avoid QT prolonging drugs if possible.  Generalized weakness/physical deconditioning Patient is unable to care for himself at home.  He was found lying in the bed for the past 2 days covered in urine and feces.  PT/OT eval, fall precautions, TOC consulted for placement.  Hypertension  Cirrhosis  Chronic anemia Likely related to chronic alcohol use.  Hemoglobin stable and no signs of bleeding.  Monitor CBC.    ammonia level normal, UA and UDS pending, CT head showing chronic right subdural hematoma and no acute intracranial abnormality    DVT prophylaxis: {Blank single:19197::"Lovenox ","SQ Heparin ","IV heparin  gtt","Xarelto","Eliquis","Coumadin","SCDs","***"} Code Status: {Blank single:19197::"Full Code","DNR","DNR/DNI","Comfort Care","***"} Family Communication: ***  Consults called: ***  Level of care: {Blank single:19197::"Med-Surg","Telemetry bed","Progressive Care Unit","Step Down Unit"} Admission status: *** Time Spent: 75+ minutes***  Juliette Oh MD [facility]  If 7PM-7AM, please contact night-coverage [email or web address]  10/10/2023, 10:59 PM

## 2023-10-10 NOTE — H&P (Incomplete)
 History and Physical    Richard Andrews ZOX:[id number] DOB: 1960-10-30 DOA: 10/10/2023  PCP: Cherre Cornish, NP  Patient coming from: Home  Chief Complaint: Generalized weakness  HPI: Richard Andrews is a 63 y.o. male with medical history significant of hypertension, alcohol use disorder with history of severe alcohol withdrawal, cirrhosis, traumatic subdural hematoma.  Patient was recently admitted to the hospital 4/15-4/24 for acute lower GI bleed and anemia.  GI was consulted and deferred endoscopic evaluation.  Lower GI bleed resolved spontaneously.  Outpatient hematology follow-up was recommended for pancytopenia.  Patient could not be discharged to SNF so he was discharged home with home health services.  The day after discharge 4/25 patient visited the ED for alcohol intoxication and right shoulder pain.  CT head was showing chronic stable right subdural hematoma and no acute findings.  X-ray of shoulder was negative for acute bony abnormality.  Patient was discharged from the ED.  Patient presents to the ED today via EMS for evaluation of generalized weakness.  Patient was laying in his bed for the past 2 days as he was too weak to get up.  He had not eaten or drank anything over that time.  Family called EMS today and when they arrived patient was found covered in urine and stool.  He was tachycardic and was given 600 mL IV fluids prior to arrival.  Patient slightly tachycardic on arrival to the ED but remainder of vital signs stable.  Labs showing no leukocytosis, hemoglobin 9.9 (stable), platelet count normal, potassium 3.3, bicarb 21, glucose 116, BUN 7, creatinine 0.5, UA and UDS pending, ammonia level normal, ethanol level 248 (previously as high as 370s), troponin negative x 2, magnesium  1.3, lactic acid 4.9, VBG with pH 7.5.  Chest x-ray showing no active disease.  CT head showing chronic right subdural hematoma and no acute intracranial abnormality. Patient was given Librium  50 mg, Ativan  2 mg,  IV thiamine  100 mg, IV mag 2 g, IV potassium 10 mEq x 3, and 1 L LR bolus in the ED.  [facility] called to admit.  Patient is reporting generalized weakness.  States he has been lying in bed for the past 2 days as he is too weak to get up.  Today his brother-in-law checked on him and called EMS.  He has not been able to eat or drink anything for the past 2 days.  Normally drinks a pint of liquor every day.  Reports history of previous hospitalization for alcohol withdrawal.  Reports urinary frequency and urgency.  Denies fevers, chills, cough, shortness of breath, chest pain, vomiting, abdominal pain, or diarrhea.  He does not take any medications at home.  Review of Systems:  Review of Systems  All other systems reviewed and are negative.   Past Medical History:  Diagnosis Date  . Allergy   . Arthritis   . Distal radius fracture, left   . Hepatic steatosis   . History of kidney stones   . Hypertension   . Kidney stone 02/16/2023  . Substance abuse [facility]     Past Surgical History:  Procedure Laterality Date  . BIOPSY  01/22/2021   Procedure: BIOPSY;  Surgeon: Annis Kinder, DO;  Location: [facility];  Service: Gastroenterology;;  . Orin Birk W/ URETERAL STENT PLACEMENT Bilateral 01/23/2023   Procedure: 1. Cystoscopy 2. Bilateral  retrograde pyelogram with interpretation 3. bilateral ureteral stent placement (6x24 cm on the right, 6x26 cm on the left) 4. Fluoroscopy <1 hour with intraoperative interpretation;  Surgeon: Mallie Seal, MD;  Location: [facility];  Service: Urology;  Laterality: Bilateral;  . CYSTOSCOPY/URETEROSCOPY/HOLMIUM LASER/STENT PLACEMENT Bilateral 03/21/2023   Procedure: BILATERAL URETEROSCOPY/HOLMIUM LASER/STENT PLACEMENT;  Surgeon: Mallie Seal, MD;  Location: [facility] ORS;  Service: Urology;  Laterality: Bilateral;  . ESOPHAGOGASTRODUODENOSCOPY (EGD) WITH PROPOFOL  N/A 01/22/2021   Procedure: ESOPHAGOGASTRODUODENOSCOPY (EGD) WITH PROPOFOL ;  Surgeon: Annis Kinder,  DO;  Location: [facility];  Service: Gastroenterology;  Laterality: N/A;  . I & D EXTREMITY Right 01/10/2020   Procedure: ORIF RIGHT WRIST;  Surgeon: Ronn Cohn, MD;  Location: [facility] OR;  Service: Orthopedics;  Laterality: Right;  . IR ANGIO EXTERNAL CAROTID SEL EXT CAROTID UNI R MOD SED  10/04/2022  . IR ANGIO INTRA EXTRACRAN SEL INTERNAL CAROTID UNI R MOD SED  09/30/2022  . IR ANGIOGRAM FOLLOW UP STUDY  09/30/2022  . IR NEURO EACH ADD'L AFTER BASIC UNI RIGHT (MS)  10/04/2022  . IR TRANSCATH/EMBOLIZ  09/30/2022  . NO PAST SURGERIES    . OPEN REDUCTION INTERNAL FIXATION (ORIF) DISTAL RADIAL FRACTURE Left 01/28/2015   Procedure: OPEN REDUCTION INTERNAL FIXATION (ORIF) LEFT DISTAL RADIAL FRACTURE;  Surgeon: Wes Hamman, MD;  Location: [facility];  Service: Orthopedics;  Laterality: Left;  . ORIF HUMERUS FRACTURE Right 09/27/2022   Procedure: OPEN REDUCTION INTERNAL FIXATION (ORIF) PROXIMAL HUMERUS FRACTURE;  Surgeon: Osa Blase, MD;  Location: [facility] OR;  Service: Orthopedics;  Laterality: Right;  . RADIOLOGY WITH ANESTHESIA N/A 09/30/2022   Procedure: IR WITH ANESTHESIA MMA EMBOLIZATION;  Surgeon: Radiologist, Medication, MD;  Location: [facility] OR;  Service: Radiology;  Laterality: N/A;  . TONSILLECTOMY       reports that he has been smoking cigarettes. He started smoking about 20 years ago. He has a 10.2 pack-year smoking history. He has never used smokeless tobacco. He reports current alcohol use of about 16.0 - 20.0 standard drinks of alcohol per week. He reports that he does not use drugs.  No Known Allergies  Family History  Problem Relation Age of Onset  . Heart disease Mother   . Hyperlipidemia Mother   . Skin cancer Mother   . Cancer Father   . Multiple myeloma Sister   . Throat cancer Sister     Prior to Admission medications   Medication Sig Start Date End Date Taking? Authorizing Provider  feeding supplement (ENSURE ENLIVE / ENSURE PLUS) LIQD Take 237 mLs by mouth 3  (three) times daily between meals. Patient not taking: Reported on 08/01/2023 07/15/23   Elester Grim, MD  folic acid  (FOLVITE ) 1 MG tablet Take 1 tablet (1 mg total) by mouth daily. 07/16/23   Pahwani, Regino Caprio, MD  folic acid  (FOLVITE ) 1 MG tablet Take 1 tablet (1 mg total) by mouth daily. 10/06/23   Verlyn Goad, MD  lactulose  (CHRONULAC ) 10 GM/15ML solution Take 30 mLs (20 g total) by mouth 2 (two) times daily. Patient not taking: Reported on 08/01/2023 07/15/23   Elester Grim, MD  magnesium  oxide (MAG-OX) 400 (240 Mg) MG tablet Take 1 tablet (400 mg total) by mouth daily. 07/16/23   Pahwani, Rinka R, MD  Multiple Vitamin (MULTIVITAMIN WITH MINERALS) TABS tablet Take 1 tablet by mouth daily. 07/16/23   Pahwani, Rinka R, MD  Multiple Vitamin (MULTIVITAMIN WITH MINERALS) TABS tablet Take 1 tablet by mouth daily. 10/06/23   Verlyn Goad, MD  oxyCODONE  (OXY IR/ROXICODONE ) 5 MG immediate release tablet Take 1 tablet (5 mg total) by mouth every 6 (six) hours  as needed for breakthrough pain. 08/09/23   Gherghe, Costin M, MD  pantoprazole  (PROTONIX ) 40 MG tablet Take 1 tablet (40 mg total) by mouth daily. 08/10/23   Gherghe, Costin M, MD  thiamine  (VITAMIN B-1) 100 MG tablet Take 1 tablet (100 mg total) by mouth daily. 07/16/23   Pahwani, Regino Caprio, MD  thiamine  (VITAMIN B-1) 100 MG tablet Take 1 tablet (100 mg total) by mouth daily. 10/06/23   Verlyn Goad, MD    Physical Exam: Vitals:   10/10/23 1916 10/10/23 1930 10/10/23 2239  BP: 136/89 119/76 (!) 156/93  Pulse: (!) 104 (!) 102 91  Resp: 13 15 19   Temp: 98.8 F (37.1 C)    TempSrc: Oral    SpO2: 100% 98% 99%    Physical Exam Vitals reviewed.  Constitutional:      General: He is not in acute distress. HENT:     Head: Normocephalic and atraumatic.  Eyes:     Extraocular Movements: Extraocular movements intact.  Cardiovascular:     Rate and Rhythm: Normal rate and regular rhythm.     Pulses: Normal pulses.  Pulmonary:     Effort:  Pulmonary effort is normal. No respiratory distress.     Breath sounds: Normal breath sounds. No wheezing or rales.  Abdominal:     General: Bowel sounds are normal. There is no distension.     Palpations: Abdomen is soft.     Tenderness: There is no abdominal tenderness. There is no guarding.  Musculoskeletal:     Cervical back: Normal range of motion.     Right lower leg: No edema.     Left lower leg: No edema.  Skin:    General: Skin is warm and dry.  Neurological:     General: No focal deficit present.     Mental Status: He is alert and oriented to person, place, and time.     Cranial Nerves: No cranial nerve deficit.     Sensory: No sensory deficit.     Motor: No weakness.     Labs on Admission: I have personally reviewed following labs and imaging studies  CBC: Recent Labs  Lab 10/04/23 0445 10/05/23 0439 10/10/23 2010  WBC 2.1* 1.7* 5.4  NEUTROABS 0.7* 0.9* 3.6  HGB 9.1* 9.2* 9.9*  HCT 29.6* 30.2* 29.4*  MCV 106.1* 106.7* 98.0  PLT 131* 135* 224   Basic Metabolic Panel: Recent Labs  Lab 10/04/23 0445 10/10/23 2010  NA 138 135  K 3.8 3.3*  CL 107 101  CO2 22 21*  GLUCOSE 100* 116*  BUN 16 7*  CREATININE 0.69 0.53*  CALCIUM  9.2 8.5*  MG 1.7 1.3*  PHOS 3.5  --    GFR: CrCl cannot be calculated (Unknown ideal weight.). Liver Function Tests: Recent Labs  Lab 10/10/23 2010  AST 35  ALT 18  ALKPHOS 110  BILITOT 0.8  PROT 7.2  ALBUMIN  3.4*   No results for input(s): "LIPASE", "AMYLASE" in the last 168 hours. Recent Labs  Lab 10/10/23 2009  AMMONIA 25   Coagulation Profile: No results for input(s): "INR", "PROTIME" in the last 168 hours. Cardiac Enzymes: No results for input(s): "CKTOTAL", "CKMB", "CKMBINDEX", "TROPONINI" in the last 168 hours. BNP (last 3 results) No results for input(s): "PROBNP" in the last 8760 hours. HbA1C: No results for input(s): "HGBA1C" in the last 72 hours. CBG: No results for input(s): "GLUCAP" in the last 168  hours. Lipid Profile: No results for input(s): "CHOL", "HDL", "LDLCALC", "TRIG", "CHOLHDL", "  LDLDIRECT" in the last 72 hours. Thyroid Function Tests: No results for input(s): "TSH", "T4TOTAL", "FREET4", "T3FREE", "THYROIDAB" in the last 72 hours. Anemia Panel: No results for input(s): "VITAMINB12", "FOLATE", "FERRITIN", "TIBC", "IRON", "RETICCTPCT" in the last 72 hours. Urine analysis:    Component Value Date/Time   COLORURINE YELLOW 08/01/2023 0235   APPEARANCEUR CLEAR 08/01/2023 0235   LABSPEC 1.010 08/01/2023 0235   PHURINE 6.0 08/01/2023 0235   GLUCOSEU NEGATIVE 08/01/2023 0235   HGBUR NEGATIVE 08/01/2023 0235   BILIRUBINUR NEGATIVE 08/01/2023 0235   KETONESUR NEGATIVE 08/01/2023 0235   PROTEINUR NEGATIVE 08/01/2023 0235   NITRITE NEGATIVE 08/01/2023 0235   LEUKOCYTESUR NEGATIVE 08/01/2023 0235    Radiological Exams on Admission: CT HEAD WO CONTRAST Result Date: 10/10/2023 CLINICAL DATA:  Mental status change EXAM: CT HEAD WITHOUT CONTRAST TECHNIQUE: Contiguous axial images were obtained from the base of the skull through the vertex without intravenous contrast. RADIATION DOSE REDUCTION: This exam was performed according to the departmental dose-optimization program which includes automated exposure control, adjustment of the mA and/or kV according to patient size and/or use of iterative reconstruction technique. COMPARISON:  CT head 10/06/2023. FINDINGS: Brain: Redemonstrated subdural hematoma over the right cerebral convexity measuring up to 5 mm in thickness which is unchanged since 10/06/2023. Additional small focus of subdural hemorrhage along the anterior falx is unchanged. No acute intracranial hemorrhage. No significant mass effect. No midline shift. No CT evidence to suggest completed large territory infarct. 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 Chronic microvascular ischemic changes and similar degree of parenchymal volume loss. Left middle cranial fossa  arachnoid cyst. Vascular: Atherosclerosis of the carotid siphons. No hyperdense vessel. 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 Skull: Normal. Negative for fracture or focal lesion. Sinuses/Orbits: Orbits are symmetric. Mucosal thickening and possible mucous retention cyst in the left posterior ethmoid air cells. Mild mucosal thickening in the right ethmoid sinus. Other: Mastoid air cells are clear. IMPRESSION: No CT evidence of acute intracranial abnormality. Similar chronic right subdural hematoma. No significant mass effect or midline shift. Additional chronic changes as above. Electronically Signed   By: Denny Flack M.D.   On: 10/10/2023 20:45   DG Chest Port 1 View Result Date: 10/10/2023 CLINICAL DATA:  Generalized weakness. EXAM: PORTABLE CHEST 1 VIEW COMPARISON:  09/26/2023 FINDINGS: Heart size and pulmonary vascularity are normal. Lungs are clear. Soft tissue attenuation over the right apex. No airspace disease or consolidation in the lungs. No pleural effusion or pneumothorax. Mediastinal contours appear intact. Multiple old bilateral rib fractures. Postoperative changes in the right shoulder. IMPRESSION: No active disease. Electronically Signed   By: Boyce Byes M.D.   On: 10/10/2023 20:00    EKG: Independently reviewed.  Sinus tachycardia, borderline T wave abnormalities, artifact, QTc 480.  No significant change compared to previous EKGs.  Assessment and Plan  Alcohol withdrawal Alcohol use disorder, severe, dependence Patient presented with slight tachycardia and tremulousness.  Ethanol level 248 (previously as high as 370s).  Placed on CIWA protocol; Ativan  as needed.  Thiamine , folate, and multivitamin.  Continue Librium  taper.  Elevated lactate Likely due to chronic alcohol use and dehydration.  No fever or leukocytosis to suggest infection/sepsis.  Chest x-ray not suggestive of pneumonia.  UA pending to rule out UTI.  Continue IV  fluid hydration and trend lactate.  Metabolic alkalosis  Hypokalemia and hypomagnesemia Mild QT prolongation Replace electrolytes and monitor labs.  Avoid QT prolonging drugs if possible.  Generalized weakness/physical deconditioning Patient is  unable to care for himself at home.  He was found lying in the bed for the past 2 days covered in urine and feces.  PT/OT eval, fall precautions, TOC consulted for placement.  Hypertension  Cirrhosis  Chronic anemia Likely related to chronic alcohol use.  Hemoglobin stable and no signs of bleeding.  Monitor CBC.    ammonia level normal, UA and UDS pending, CT head showing chronic right subdural hematoma and no acute intracranial abnormality    DVT prophylaxis: SCDs Code Status: Full Code (discussed with the patient) Family Communication: No family available at this time. Consults called: ***  Level of care: {Blank single:19197::"Med-Surg","Telemetry bed","Progressive Care Unit","Step Down Unit"} Admission status: *** Time Spent: 75+ minutes***  Juliette Oh MD [facility]  If 7PM-7AM, please contact night-coverage [email or web address]  10/10/2023, 10:59 PM

## 2023-10-10 NOTE — Telephone Encounter (Signed)
 Copied from CRM [id number]. Topic: General - Other >> Oct 10, 2023 12:35 PM Suzette B wrote: Reason for CRM: Debbie Sivert PT for [facility] [phone], stating that she is needing to advise the provider that the patient is in extremely bad health at this time. She has requested that family calls the ambulance and have him taking to the hospital. Ms. Sivert proceeded to advise that they are unable to continue services in the home, there's no running water , no power, and patient has been found laying in feces and urine with no one to tend to his needs. She is suggesting that the patient is placed somewhere to get assistance. If further explanation is needed please call Ms. Sivert.

## 2023-10-10 NOTE — Telephone Encounter (Signed)
 Copied from CRM [phone]. Topic: General - Other >> Oct 09, 2023  3:46 PM Blair Bumpers wrote: Reason for CRM: Fate Honor Director, with [facility], calling to speak with Shelvy Dickens. Please give her a call back directly at [phone].

## 2023-10-10 NOTE — ED Provider Notes (Signed)
  EMERGENCY DEPARTMENT AT [facility] Provider Note   CSN: [id number] Arrival date & time: 10/10/23  1905     History  Chief Complaint  Patient presents with   Failure To Thrive    Patient brought in by [facility]. Per EMS patient brother called due to patient laying in bed in supine position since Sunday covered in feces and urine. Patient is known to come to the hospital get the help he needs to feel better and then go back home and not take care of himself. Patient lives alone and brother comes to check on him periodically. Patient feeling weak and achy. Aox4. Received 600 mls of LR with medics. CO2 27  HR 110 100% room air .     Richard Andrews is a 63 y.o. male.  HPI Patient presents for generalized weakness.  Medical history includes alcohol abuse, HTN, GI bleed, SDH, gastritis, cirrhosis.  He was seen in the ED 4 days ago for alcohol intoxication.  He states that he has been laying in the bed, too weak to get up for the past 2 days.  He has not eaten or drank anything over that time.  His brother-in-law, who he lives with, called EMS today.  EMS found patient covered in urine and stool.  They noted tachycardia prior to arrival.  He did receive 600 cc IVF.  Patient states that he has pain all over but nowhere in particular.  He denies any other physical complaints.    Home Medications Prior to Admission medications   Medication Sig Start Date End Date Taking? Authorizing Provider  feeding supplement (ENSURE ENLIVE / ENSURE PLUS) LIQD Take 237 mLs by mouth 3 (three) times daily between meals. Patient not taking: Reported on 08/01/2023 07/15/23   Elester Grim, MD  folic acid  (FOLVITE ) 1 MG tablet Take 1 tablet (1 mg total) by mouth daily. 07/16/23   Pahwani, Regino Caprio, MD  folic acid  (FOLVITE ) 1 MG tablet Take 1 tablet (1 mg total) by mouth daily. 10/06/23   Verlyn Goad, MD  lactulose  (CHRONULAC ) 10 GM/15ML solution Take 30 mLs (20 g total) by mouth 2 (two) times  daily. Patient not taking: Reported on 08/01/2023 07/15/23   Elester Grim, MD  magnesium  oxide (MAG-OX) 400 (240 Mg) MG tablet Take 1 tablet (400 mg total) by mouth daily. 07/16/23   Pahwani, Rinka R, MD  Multiple Vitamin (MULTIVITAMIN WITH MINERALS) TABS tablet Take 1 tablet by mouth daily. 07/16/23   Pahwani, Rinka R, MD  Multiple Vitamin (MULTIVITAMIN WITH MINERALS) TABS tablet Take 1 tablet by mouth daily. 10/06/23   Verlyn Goad, MD  oxyCODONE  (OXY IR/ROXICODONE ) 5 MG immediate release tablet Take 1 tablet (5 mg total) by mouth every 6 (six) hours as needed for breakthrough pain. 08/09/23   Gherghe, Costin M, MD  pantoprazole  (PROTONIX ) 40 MG tablet Take 1 tablet (40 mg total) by mouth daily. 08/10/23   Gherghe, Costin M, MD  thiamine  (VITAMIN B-1) 100 MG tablet Take 1 tablet (100 mg total) by mouth daily. 07/16/23   Pahwani, Regino Caprio, MD  thiamine  (VITAMIN B-1) 100 MG tablet Take 1 tablet (100 mg total) by mouth daily. 10/06/23   Verlyn Goad, MD      Allergies    Patient has no known allergies.    Review of Systems   Review of Systems  Musculoskeletal:  Positive for arthralgias and myalgias.  Neurological:  Positive for weakness (Generalized).  All other systems  reviewed and are negative.   Physical Exam Updated Vital Signs BP (!) 156/93   Pulse 91   Temp 98.9 F (37.2 C)   Resp 19   SpO2 99%  Physical Exam Vitals and nursing note reviewed.  Constitutional:      General: He is not in acute distress.    Appearance: Normal appearance. He is well-developed. He is not ill-appearing, toxic-appearing or diaphoretic.  HENT:     Head: Normocephalic and atraumatic.     Right Ear: External ear normal.     Left Ear: External ear normal.     Nose: Nose normal.     Mouth/Throat:     Mouth: Mucous membranes are moist.  Eyes:     Extraocular Movements: Extraocular movements intact.     Conjunctiva/sclera: Conjunctivae normal.  Cardiovascular:     Rate and Rhythm: Normal rate and  regular rhythm.  Pulmonary:     Effort: Pulmonary effort is normal. No respiratory distress.     Breath sounds: Normal breath sounds. No wheezing or rales.  Chest:     Chest wall: No tenderness.  Abdominal:     General: There is no distension.     Palpations: Abdomen is soft.     Tenderness: There is no abdominal tenderness.  Musculoskeletal:        General: No swelling or deformity. Normal range of motion.     Cervical back: Normal range of motion and neck supple.  Skin:    General: Skin is warm and dry.     Coloration: Skin is not jaundiced or pale.  Neurological:     General: No focal deficit present.     Mental Status: He is alert and oriented to person, place, and time.     Cranial Nerves: No cranial nerve deficit.     Sensory: No sensory deficit.     Motor: No weakness.     Coordination: Coordination normal.     Comments: Tremulous.  Psychiatric:        Mood and Affect: Mood normal.        Behavior: Behavior normal.     ED Results / Procedures / Treatments   Labs (all labs ordered are listed, but only abnormal results are displayed) Labs Reviewed  COMPREHENSIVE METABOLIC PANEL WITH GFR - Abnormal; Notable for the following components:      Result Value   Potassium 3.3 (*)    CO2 21 (*)    Glucose, Bld 116 (*)    BUN 7 (*)    Creatinine, Ser 0.53 (*)    Calcium  8.5 (*)    Albumin  3.4 (*)    All other components within normal limits  CBC WITH DIFFERENTIAL/PLATELET - Abnormal; Notable for the following components:   RBC 3.00 (*)    Hemoglobin 9.9 (*)    HCT 29.4 (*)    All other components within normal limits  BLOOD GAS, VENOUS - Abnormal; Notable for the following components:   pH, Ven 7.5 (*)    pCO2, Ven 36 (*)    pO2, Ven 48 (*)    Bicarbonate 28.1 (*)    Acid-Base Excess 4.7 (*)    All other components within normal limits  ETHANOL - Abnormal; Notable for the following components:   Alcohol, Ethyl (B) 248 (*)    All other components within normal  limits  MAGNESIUM  - Abnormal; Notable for the following components:   Magnesium  1.3 (*)    All other components within normal limits  I-STAT  CG4 LACTIC ACID, ED - Abnormal; Notable for the following components:   Lactic Acid, Venous 4.9 (*)    All other components within normal limits  AMMONIA  URINALYSIS, ROUTINE W REFLEX MICROSCOPIC  RAPID URINE DRUG SCREEN, HOSP PERFORMED  CBG MONITORING, ED  I-STAT CG4 LACTIC ACID, ED  TROPONIN I (HIGH SENSITIVITY)  TROPONIN I (HIGH SENSITIVITY)    EKG EKG Interpretation Date/Time:  Tuesday October 10 2023 20:08:19 EDT Ventricular Rate:  101 PR Interval:  137 QRS Duration:  88 QT Interval:  370 QTC Calculation: 480 R Axis:   74  Text Interpretation: Sinus tachycardia Borderline T wave abnormalities Borderline prolonged QT interval Confirmed by Iva Mariner [phone]) on 10/10/2023 8:54:49 PM  Radiology CT HEAD WO CONTRAST Result Date: 10/10/2023 CLINICAL DATA:  Mental status change EXAM: CT HEAD WITHOUT CONTRAST TECHNIQUE: Contiguous axial images were obtained from the base of the skull through the vertex without intravenous contrast. RADIATION DOSE REDUCTION: This exam was performed according to the departmental dose-optimization program which includes automated exposure control, adjustment of the mA and/or kV according to patient size and/or use of iterative reconstruction technique. COMPARISON:  CT head 10/06/2023. FINDINGS: Brain: Redemonstrated subdural hematoma over the right cerebral convexity measuring up to 5 mm in thickness which is unchanged since 10/06/2023. Additional small focus of subdural hemorrhage along the anterior falx is unchanged. No acute intracranial hemorrhage. No significant mass effect. No midline shift. No CT evidence to suggest completed large territory infarct. 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 Chronic microvascular ischemic changes and similar degree of parenchymal volume loss. Left middle cranial fossa  arachnoid cyst. Vascular: Atherosclerosis of the carotid siphons. No hyperdense vessel. 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 Skull: Normal. Negative for fracture or focal lesion. Sinuses/Orbits: Orbits are symmetric. Mucosal thickening and possible mucous retention cyst in the left posterior ethmoid air cells. Mild mucosal thickening in the right ethmoid sinus. Other: Mastoid air cells are clear. IMPRESSION: No CT evidence of acute intracranial abnormality. Similar chronic right subdural hematoma. No significant mass effect or midline shift. Additional chronic changes as above. Electronically Signed   By: Denny Flack M.D.   On: 10/10/2023 20:45   DG Chest Port 1 View Result Date: 10/10/2023 CLINICAL DATA:  Generalized weakness. EXAM: PORTABLE CHEST 1 VIEW COMPARISON:  09/26/2023 FINDINGS: Heart size and pulmonary vascularity are normal. Lungs are clear. Soft tissue attenuation over the right apex. No airspace disease or consolidation in the lungs. No pleural effusion or pneumothorax. Mediastinal contours appear intact. Multiple old bilateral rib fractures. Postoperative changes in the right shoulder. IMPRESSION: No active disease. Electronically Signed   By: Boyce Byes M.D.   On: 10/10/2023 20:00    Procedures Procedures    Medications Ordered in ED Medications  potassium chloride  10 mEq in 100 mL IVPB (10 mEq Intravenous New Bag/Given 10/10/23 2228)  0.9 %  sodium chloride  infusion ( Intravenous New Bag/Given 10/10/23 2110)  LORazepam  (ATIVAN ) tablet 1-4 mg (has no administration in time range)    Or  LORazepam  (ATIVAN ) injection 1-4 mg (has no administration in time range)  thiamine  (VITAMIN B1) tablet 100 mg (has no administration in time range)    Or  thiamine  (VITAMIN B1) injection 100 mg (has no administration in time range)  folic acid  (FOLVITE ) tablet 1 mg (has no administration in time range)  multivitamin with minerals tablet 1  tablet (has no administration in time range)  lactated ringers  bolus 1,000 mL (0 mLs Intravenous  Stopped 10/10/23 2109)  chlordiazePOXIDE  (LIBRIUM ) capsule 50 mg (50 mg Oral Given 10/10/23 2004)  LORazepam  (ATIVAN ) injection 2 mg (2 mg Intravenous Given 10/10/23 2006)  thiamine  (VITAMIN B1) injection 100 mg (100 mg Intravenous Given 10/10/23 2004)  magnesium  sulfate IVPB 2 g 50 mL (0 g Intravenous Stopped 10/10/23 2205)    ED Course/ Medical Decision Making/ A&P                                 Medical Decision Making Amount and/or Complexity of Data Reviewed Labs: ordered. Radiology: ordered.  Risk Prescription drug management. Decision regarding hospitalization.   This patient presents to the ED for concern of generalized weakness, this involves an extensive number of treatment options, and is a complaint that carries with it a high risk of complications and morbidity.  The differential diagnosis includes dehydration, deconditioning, infection, metabolic derangements, alcohol withdrawal   Co morbidities that complicate the patient evaluation  alcohol abuse, HTN, GI bleed, SDH, gastritis, cirrhosis   Additional history obtained:  Additional history obtained from EMS External records from outside source obtained and reviewed including EMR   Lab Tests:  I Ordered, and personally interpreted labs.  The pertinent results include: Elevated ethanol level despite stating that he has not drank in 2 days; baseline anemia, no leukocytosis, normal kidney function.  Hypokalemia and hypomagnesemia are present.  Lactate is elevated, likely secondary to chronic alcohol use and ketosis.   Imaging Studies ordered:  I ordered imaging studies including chest x-ray, CT head I independently visualized and interpreted imaging which showed no acute findings; redemonstration of known chronic right subdural hematoma I agree with the radiologist interpretation   Cardiac Monitoring: / EKG:  The  patient was maintained on a cardiac monitor.  I personally viewed and interpreted the cardiac monitored which showed an underlying rhythm of: Sinus rhythm   Problem List / ED Course / Critical interventions / Medication management  Patient presents for generalized weakness.  He arrives by EMS.  He has reportedly been laying in his bed for the past 2 days, not eating or drinking.  He has a known history of alcohol abuse.  He is tremulous and mildly tachycardic on exam.  Will treat for alcohol withdrawal.  Workup was initiated to identify other possible causes of his weakness.  Lab work is notable for lactic acidosis, hypokalemia, and hypomagnesemia.  Repletion electrolytes were ordered.  Additional IV fluids were ordered.  Lactic acidosis likely secondary to chronic alcohol use causing NADH/NAD plus mismatch in addition to starvation ketosis from not eating in 2 days.  Imaging did not show acute findings.  CT scan of head did show redemonstration of known chronic right subdural hematoma.  Given his ongoing severe weakness and concern for severe withdrawals, patient was admitted for further management. I ordered medication including IV fluids for hydration; Ativan  and Librium  for alcohol withdrawal; testing quad for hypokalemia; magnesium  sulfate for hypomagnesemia Reevaluation of the patient after these medicines showed that the patient improved I have reviewed the patients home medicines and have made adjustments as needed  Social Determinants of Health:  Ongoing alcohol abuse  CRITICAL CARE Performed by: Iva Mariner   Total critical care time: 32 minutes  Critical care time was exclusive of separately billable procedures and treating other patients.  Critical care was necessary to treat or prevent imminent or life-threatening deterioration.  Critical care was time spent personally by me on the  following activities: development of treatment plan with patient and/or surrogate as well as  nursing, discussions with consultants, evaluation of patient's response to treatment, examination of patient, obtaining history from patient or surrogate, ordering and performing treatments and interventions, ordering and review of laboratory studies, ordering and review of radiographic studies, pulse oximetry and re-evaluation of patient's condition.         Final Clinical Impression(s) / ED Diagnoses Final diagnoses:  Alcohol withdrawal syndrome without complication (HCC)  Generalized weakness  Hypokalemia  Hypomagnesemia    Rx / DC Orders ED Discharge Orders     None         Iva Mariner, MD 10/10/23 2315

## 2023-10-11 ENCOUNTER — Ambulatory Visit: Payer: Self-pay | Admitting: Student

## 2023-10-11 ENCOUNTER — Inpatient Hospital Stay (HOSPITAL_COMMUNITY)

## 2023-10-11 ENCOUNTER — Encounter (HOSPITAL_COMMUNITY): Payer: Self-pay | Admitting: Internal Medicine

## 2023-10-11 DIAGNOSIS — F10930 Alcohol use, unspecified with withdrawal, uncomplicated: Secondary | ICD-10-CM | POA: Diagnosis not present

## 2023-10-11 DIAGNOSIS — S72002S Fracture of unspecified part of neck of left femur, sequela: Secondary | ICD-10-CM

## 2023-10-11 DIAGNOSIS — R7989 Other specified abnormal findings of blood chemistry: Secondary | ICD-10-CM

## 2023-10-11 LAB — BASIC METABOLIC PANEL WITH GFR
Anion gap: 11 (ref 5–15)
BUN: 6 mg/dL — ABNORMAL LOW (ref 8–23)
CO2: 23 mmol/L (ref 22–32)
Calcium: 8 mg/dL — ABNORMAL LOW (ref 8.9–10.3)
Chloride: 102 mmol/L (ref 98–111)
Creatinine, Ser: 0.42 mg/dL — ABNORMAL LOW (ref 0.61–1.24)
GFR, Estimated: >60 mL/min (ref >60)
Glucose, Bld: 105 mg/dL — ABNORMAL HIGH (ref 70–99)
Potassium: 3.4 mmol/L — ABNORMAL LOW (ref 3.5–5.1)
Sodium: 136 mmol/L (ref 135–145)

## 2023-10-11 LAB — CBC
HCT: 26.1 % — ABNORMAL LOW (ref 39.0–52.0)
Hemoglobin: 8.4 g/dL — ABNORMAL LOW (ref 13.0–17.0)
MCH: 32.4 pg (ref 26.0–34.0)
MCHC: 32.2 g/dL (ref 30.0–36.0)
MCV: 100.8 fL — ABNORMAL HIGH (ref 80.0–100.0)
Platelets: 157 10*3/uL (ref 150–400)
RBC: 2.59 MIL/uL — ABNORMAL LOW (ref 4.22–5.81)
RDW: 15.6 % — ABNORMAL HIGH (ref 11.5–15.5)
WBC: 3.8 10*3/uL — ABNORMAL LOW (ref 4.0–10.5)
nRBC: 0 % (ref 0.0–0.2)

## 2023-10-11 LAB — RAPID URINE DRUG SCREEN, HOSP PERFORMED
Amphetamines: NOT DETECTED
Barbiturates: NOT DETECTED
Benzodiazepines: POSITIVE — AB
Cocaine: NOT DETECTED
Opiates: NOT DETECTED
Tetrahydrocannabinol: NOT DETECTED

## 2023-10-11 LAB — URINALYSIS, ROUTINE W REFLEX MICROSCOPIC
Bilirubin Urine: NEGATIVE
Glucose, UA: NEGATIVE mg/dL
Hgb urine dipstick: NEGATIVE
Ketones, ur: NEGATIVE mg/dL
Leukocytes,Ua: NEGATIVE
Nitrite: NEGATIVE
Protein, ur: NEGATIVE mg/dL
Specific Gravity, Urine: 1.009 (ref 1.005–1.030)
pH: 7 (ref 5.0–8.0)

## 2023-10-11 LAB — PREPARE RBC (CROSSMATCH)

## 2023-10-11 LAB — MAGNESIUM: Magnesium: 1.9 mg/dL (ref 1.7–2.4)

## 2023-10-11 LAB — LACTIC ACID, PLASMA: Lactic Acid, Venous: 3.4 mmol/L (ref 0.5–1.9)

## 2023-10-11 LAB — MRSA NEXT GEN BY PCR, NASAL: MRSA by PCR Next Gen: NOT DETECTED

## 2023-10-11 MED ORDER — MAGNESIUM SULFATE 2 GM/50ML IV SOLN
2.0000 g | Freq: Once | INTRAVENOUS | Status: AC
  Administered 2023-10-11: 2 g via INTRAVENOUS
  Filled 2023-10-11: qty 50

## 2023-10-11 MED ORDER — CHLORDIAZEPOXIDE HCL 25 MG PO CAPS
25.0000 mg | ORAL_CAPSULE | Freq: Four times a day (QID) | ORAL | Status: AC
  Administered 2023-10-11 (×4): 25 mg via ORAL
  Filled 2023-10-11 (×4): qty 1

## 2023-10-11 MED ORDER — SODIUM CHLORIDE 0.9 % IV SOLN: INTRAVENOUS | Status: AC

## 2023-10-11 MED ORDER — OXYCODONE HCL 5 MG PO TABS
5.0000 mg | ORAL_TABLET | Freq: Four times a day (QID) | ORAL | Status: DC | PRN
  Administered 2023-10-12: 5 mg via ORAL
  Filled 2023-10-11: qty 1

## 2023-10-11 MED ORDER — ACETAMINOPHEN 650 MG RE SUPP: 650.0000 mg | Freq: Four times a day (QID) | RECTAL | Status: DC | PRN

## 2023-10-11 MED ORDER — OXYCODONE HCL 5 MG PO TABS
5.0000 mg | ORAL_TABLET | Freq: Four times a day (QID) | ORAL | Status: AC | PRN
  Administered 2023-10-11: 5 mg via ORAL
  Filled 2023-10-11: qty 1

## 2023-10-11 MED ORDER — CHLORDIAZEPOXIDE HCL 25 MG PO CAPS
25.0000 mg | ORAL_CAPSULE | Freq: Three times a day (TID) | ORAL | Status: AC
  Administered 2023-10-12 (×2): 25 mg via ORAL
  Filled 2023-10-11 (×2): qty 1

## 2023-10-11 MED ORDER — PANTOPRAZOLE SODIUM 40 MG PO TBEC
40.0000 mg | DELAYED_RELEASE_TABLET | Freq: Every day | ORAL | Status: DC
  Administered 2023-10-11 – 2023-10-20 (×9): 40 mg via ORAL
  Filled 2023-10-11 (×9): qty 1

## 2023-10-11 MED ORDER — SODIUM CHLORIDE 0.9% IV SOLUTION: Freq: Once | INTRAVENOUS | Status: AC

## 2023-10-11 MED ORDER — ORAL CARE MOUTH RINSE: 15.0000 mL | OROMUCOSAL | Status: DC | PRN

## 2023-10-11 MED ORDER — CHLORHEXIDINE GLUCONATE CLOTH 2 % EX PADS
6.0000 | MEDICATED_PAD | Freq: Every day | CUTANEOUS | Status: DC
  Administered 2023-10-11 – 2023-10-20 (×10): 6 via TOPICAL

## 2023-10-11 MED ORDER — HYDRALAZINE HCL 20 MG/ML IJ SOLN
5.0000 mg | Freq: Four times a day (QID) | INTRAMUSCULAR | Status: DC | PRN
  Administered 2023-10-11: 5 mg via INTRAVENOUS
  Filled 2023-10-11: qty 1

## 2023-10-11 MED ORDER — OXYCODONE HCL 5 MG PO TABS
5.0000 mg | ORAL_TABLET | Freq: Once | ORAL | Status: AC | PRN
  Administered 2023-10-11: 5 mg via ORAL
  Filled 2023-10-11: qty 1

## 2023-10-11 MED ORDER — CHLORDIAZEPOXIDE HCL 25 MG PO CAPS
25.0000 mg | ORAL_CAPSULE | Freq: Every day | ORAL | Status: AC
  Administered 2023-10-14: 25 mg via ORAL
  Filled 2023-10-11: qty 1

## 2023-10-11 MED ORDER — MORPHINE SULFATE (PF) 2 MG/ML IV SOLN
1.0000 mg | INTRAVENOUS | Status: DC | PRN
  Administered 2023-10-11 – 2023-10-12 (×3): 1 mg via INTRAVENOUS
  Filled 2023-10-11 (×3): qty 1

## 2023-10-11 MED ORDER — CHLORDIAZEPOXIDE HCL 25 MG PO CAPS
25.0000 mg | ORAL_CAPSULE | ORAL | Status: AC
  Administered 2023-10-13 (×2): 25 mg via ORAL
  Filled 2023-10-11 (×2): qty 1

## 2023-10-11 MED ORDER — ACETAMINOPHEN 325 MG PO TABS
650.0000 mg | ORAL_TABLET | Freq: Four times a day (QID) | ORAL | Status: DC | PRN
  Administered 2023-10-16: 650 mg via ORAL
  Filled 2023-10-11: qty 2

## 2023-10-11 MED ORDER — POTASSIUM CHLORIDE CRYS ER 20 MEQ PO TBCR
40.0000 meq | EXTENDED_RELEASE_TABLET | Freq: Once | ORAL | Status: AC
  Administered 2023-10-11: 40 meq via ORAL
  Filled 2023-10-11: qty 2

## 2023-10-11 NOTE — ED Notes (Signed)
 Provider notified patient needing more pain meds

## 2023-10-11 NOTE — ED Notes (Addendum)
 PT in room working with patient now

## 2023-10-11 NOTE — ED Notes (Addendum)
 Completed entire bed change and bed bath for patient with help of tech. Pt in nad at this time, given warm blankets, fall socks on, new sheets and johhny, and call bell in reach, rehooked to monitor. Pt has visible hemorrhoids

## 2023-10-11 NOTE — Telephone Encounter (Signed)
 Left message advising ok to resume care.

## 2023-10-11 NOTE — Progress Notes (Signed)
 OT Cancellation Note  Patient Details Name: METHOD SATER MRN: [id number] DOB: 03/19/1961   Cancelled Treatment:    Reason Eval/Treat Not Completed: Patient declined, no reason specified Patient declined to participate at this time. OT to continue to follow and check back as schedule will allow.  Wynette Heckler, MS Acute Rehabilitation Department Office# [phone]  10/11/2023, 8:37 AM

## 2023-10-11 NOTE — Progress Notes (Signed)
 Consult received from Dr. Vann TRH for left femoral neck fracture.  Patient came to the emergency department yesterday after having been in bed for 2 days without eating or drinking.  Once he was resuscitated, he was mobilized this morning with PT.  He was unable to participate with PT due to severe left hip pain.  X-ray was obtained of the left hip, revealing a displaced left femoral neck fracture.  Patient does not recall falling.  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  He was hospitalized earlier this month for a GI bleed.  He is chronically anemic.  I discussed the patient directly with Dr. Vann due to the medical complexity.  He will require surgical intervention for pain control and mobilization out of bed.  He is at high risk for perioperative medical and surgical complications due to his alcohol abuse and its sequela.  Dr. Mikle Alexanders plans to transfuse the patient 2 units PRBCs in preparation for surgery.  Patient is not a candidate for chemical DVT prophylaxis due to brain bleed, hypercoagulable state, and frequent falls.  Plan for left total hip arthroplasty tomorrow morning.  Full consult to follow.

## 2023-10-11 NOTE — ED Notes (Signed)
 New type and screen sent to lab with appropriate paperwork

## 2023-10-11 NOTE — ED Notes (Signed)
 Changed incontinence on pts pads on bed, replaced with dry
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
## 2023-10-11 NOTE — Consult Note (Signed)
 ORTHOPAEDIC CONSULTATION  REQUESTING PHYSICIAN: Enrigue Harvard, DO  PCP:  Cherre Cornish, NP  Chief Complaint: left hip pain  HPI: Richard Andrews is a 63 y.o. male who has PMH significant of hypertension, alcohol use disorder with history of severe alcohol withdrawal, cirrhosis, traumatic subdural hematoma, lower GI bleed and anemia. Consult received from Dr. Vann [facility] for left femoral neck fracture.  Patient came to the emergency department yesterday after having been in bed for 2 days without eating or drinking.  Once he was resuscitated, he was mobilized this morning with PT.  He was unable to participate with PT due to severe left hip pain.  X-ray was obtained of the left hip, revealing a displaced left femoral neck fracture.  Patient does not recall falling. 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  He was hospitalized earlier this month for a GI bleed.  He is chronically anemic. He currently reports left hip pain. Denies other injuries.   He reports he smokes 1 PPD, drinks 1 pint whiskey per day. He lives at home, he reports someone lives with him (did not say who). He normally does not use assistive devices.   Past Medical History:  Diagnosis Date   Allergy    Arthritis    Distal radius fracture, left    Hepatic steatosis    History of kidney stones    Hypertension    Kidney stone 02/16/2023   Substance abuse [facility])    Past Surgical History:  Procedure Laterality Date   BIOPSY  01/22/2021   Procedure: BIOPSY;  Surgeon: Annis Kinder, DO;  Location: [facility];  Service: Gastroenterology;;   CYSTOSCOPY W/ URETERAL STENT PLACEMENT Bilateral 01/23/2023   Procedure: 1. Cystoscopy 2. Bilateral  retrograde pyelogram with interpretation 3. bilateral ureteral stent placement (6x24 cm on the right, 6x26 cm on the left) 4. Fluoroscopy <1 hour with intraoperative interpretation;  Surgeon: Mallie Seal, MD;  Location: [facility] OR;  Service: Urology;  Laterality: Bilateral;    CYSTOSCOPY/URETEROSCOPY/HOLMIUM LASER/STENT PLACEMENT Bilateral 03/21/2023   Procedure: BILATERAL URETEROSCOPY/HOLMIUM LASER/STENT PLACEMENT;  Surgeon: Mallie Seal, MD;  Location: [facility] ORS;  Service: Urology;  Laterality: Bilateral;   ESOPHAGOGASTRODUODENOSCOPY (EGD) WITH PROPOFOL  N/A 01/22/2021   Procedure: ESOPHAGOGASTRODUODENOSCOPY (EGD) WITH PROPOFOL ;  Surgeon: Annis Kinder, DO;  Location: [facility];  Service: Gastroenterology;  Laterality: N/A;   I & D EXTREMITY Right 01/10/2020   Procedure: ORIF RIGHT WRIST;  Surgeon: Ronn Cohn, MD;  Location: [facility] OR;  Service: Orthopedics;  Laterality: Right;   IR ANGIO EXTERNAL CAROTID SEL EXT CAROTID UNI R MOD SED  10/04/2022   IR ANGIO INTRA EXTRACRAN SEL INTERNAL CAROTID UNI R MOD SED  09/30/2022   IR ANGIOGRAM FOLLOW UP STUDY  09/30/2022   IR NEURO EACH ADD'L AFTER BASIC UNI RIGHT (MS)  10/04/2022   IR TRANSCATH/EMBOLIZ  09/30/2022   NO PAST SURGERIES     OPEN REDUCTION INTERNAL FIXATION (ORIF) DISTAL RADIAL FRACTURE Left 01/28/2015   Procedure: OPEN REDUCTION INTERNAL FIXATION (ORIF) LEFT DISTAL RADIAL FRACTURE;  Surgeon: Wes Hamman, MD;  Location: [facility];  Service: Orthopedics;  Laterality: Left;   ORIF HUMERUS FRACTURE Right 09/27/2022   Procedure: OPEN REDUCTION INTERNAL FIXATION (ORIF) PROXIMAL HUMERUS FRACTURE;  Surgeon: Osa Blase, MD;  Location: [facility] OR;  Service: Orthopedics;  Laterality: Right;   RADIOLOGY WITH ANESTHESIA N/A 09/30/2022   Procedure: IR WITH ANESTHESIA MMA EMBOLIZATION;  Surgeon: Radiologist, Medication, MD;  Location: [facility] OR;  Service: Radiology;  Laterality: N/A;   TONSILLECTOMY     Social History   Socioeconomic History   Marital status: Legally Separated    Spouse name: Craige Dixon   Number of children: 5   Years of education: 12   Highest education level: Not on file  Occupational History   Occupation: Emergency planning/management officer  Tobacco Use   Smoking status: Every Day    Current packs/day:  0.50    Average packs/day: 0.5 packs/day for 20.3 years (10.2 ttl pk-yrs)    Types: Cigarettes    Start date: 2005   Smokeless tobacco: Never  Vaping Use   Vaping status: Never Used  Substance and Sexual Activity   Alcohol use: Yes    Alcohol/week: 16.0 - 20.0 standard drinks of alcohol    Types: 16 - 20 Standard drinks or equivalent per week    Comment: 2/5 a week   Drug use: Never   Sexual activity: Not Currently    Partners: Female  Other Topics Concern   Not on file  Social History Narrative   ** Merged History Encounter **       Social Drivers of Health   Financial Resource Strain: Not on file  Food Insecurity: Food Insecurity Present (09/26/2023)   Hunger Vital Sign    Worried About Running Out of Food in the Last Year: Often true    Ran Out of Food in the Last Year: Often true  Transportation Needs: Unmet Transportation Needs (09/26/2023)   PRAPARE - Administrator, Civil Service (Medical): Yes    Lack of Transportation (Non-Medical): No  Physical Activity: Not on file  Stress: Not on file  Social Connections: Unknown (08/01/2023)   Social Connection and Isolation Panel [NHANES]    Frequency of Communication with Friends and Family: Three times a week    Frequency of Social Gatherings with Friends and Family: Patient declined    Attends Religious Services: Patient declined    Active Member of Clubs or Organizations: Patient declined    Attends Banker Meetings: Never    Marital Status: Divorced  Recent Concern: Social Connections - Socially Isolated (07/06/2023)   Social Connection and Isolation Panel [NHANES]    Frequency of Communication with Friends and Family: Three times a week    Frequency of Social Gatherings with Friends and Family: Not on file    Attends Religious Services: Never    Database administrator or Organizations: No    Attends Banker Meetings: Never    Marital Status: Divorced   Family History  Problem  Relation Age of Onset   Heart disease Mother    Hyperlipidemia Mother    Skin cancer Mother    Cancer Father    Multiple myeloma Sister    Throat cancer Sister    No Known Allergies Prior to Admission medications   Medication Sig Start Date End Date Taking? Authorizing Provider  acetaminophen  (TYLENOL ) 500 MG tablet Take 500 mg by mouth every 6 (six) hours as needed for moderate pain (pain score 4-6).   Yes [provider]  folic acid  (FOLVITE ) 1 MG tablet Take 1 tablet (1 mg total) by mouth daily. Patient not taking: Reported on 10/11/2023 10/06/23   Verlyn Goad, MD  lactulose  (CHRONULAC ) 10 GM/15ML solution Take 30 mLs (20 g total) by mouth 2 (two) times daily. Patient not taking: Reported on 08/01/2023 07/15/23   Elester Grim, MD  magnesium  oxide (MAG-OX) 400 (240 Mg) MG tablet  Take 1 tablet (400 mg total) by mouth daily. Patient not taking: Reported on 10/11/2023 07/16/23   Pahwani, Rinka R, MD  Multiple Vitamin (MULTIVITAMIN WITH MINERALS) TABS tablet Take 1 tablet by mouth daily. Patient not taking: Reported on 10/11/2023 10/06/23   Verlyn Goad, MD  oxyCODONE  (OXY IR/ROXICODONE ) 5 MG immediate release tablet Take 1 tablet (5 mg total) by mouth every 6 (six) hours as needed for breakthrough pain. Patient not taking: Reported on 10/11/2023 08/09/23   Gherghe, Costin M, MD  pantoprazole  (PROTONIX ) 40 MG tablet Take 1 tablet (40 mg total) by mouth daily. Patient not taking: Reported on 10/11/2023 08/10/23   Gherghe, Costin M, MD  thiamine  (VITAMIN B-1) 100 MG tablet Take 1 tablet (100 mg total) by mouth daily. Patient not taking: Reported on 10/11/2023 10/06/23   Verlyn Goad, MD   DG Knee Left Port Result Date: 10/11/2023 CLINICAL DATA:  Left femoral neck fracture from a fall. Clinical concern for possible knee injury. EXAM: PORTABLE LEFT KNEE - 1-2 VIEW COMPARISON:  Left hip radiographs obtained earlier today. FINDINGS: No evidence of fracture, dislocation, or joint effusion.  No evidence of arthropathy or other focal bone abnormality. Soft tissues are unremarkable. IMPRESSION: Negative. Electronically Signed   By: Catherin Closs M.D.   On: 10/11/2023 11:15   DG HIP UNILAT WITH PELVIS 2-3 VIEWS LEFT Result Date: 10/11/2023 CLINICAL DATA:  161096 Fall [id number] left hip pain EXAM: DG HIP (WITH OR WITHOUT PELVIS) 2-3V LEFT COMPARISON:  None Available. FINDINGS: Diffuse osteopenia. Impacted, mildly displaced, subcapital fracture of the left femoral neck. Moderate osteoarthritis of both hips. Soft tissues are unremarkable. IMPRESSION: Impacted, mildly displaced subcapital left femoral neck fracture. Electronically Signed   By: Rance Burrows M.D.   On: 10/11/2023 09:54   CT HEAD WO CONTRAST Result Date: 10/10/2023 CLINICAL DATA:  Mental status change EXAM: CT HEAD WITHOUT CONTRAST TECHNIQUE: Contiguous axial images were obtained from the base of the skull through the vertex without intravenous contrast. RADIATION DOSE REDUCTION: This exam was performed according to the departmental dose-optimization program which includes automated exposure control, adjustment of the mA and/or kV according to patient size and/or use of iterative reconstruction technique. COMPARISON:  CT head 10/06/2023. FINDINGS: Brain: Redemonstrated subdural hematoma over the right cerebral convexity measuring up to 5 mm in thickness which is unchanged since 10/06/2023. Additional small focus of subdural hemorrhage along the anterior falx is unchanged. No acute intracranial hemorrhage. No significant mass effect. No midline shift. No CT evidence to suggest completed large territory infarct. 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 Chronic microvascular ischemic changes and similar degree of parenchymal volume loss. Left middle cranial fossa arachnoid cyst. Vascular: Atherosclerosis of the carotid siphons. No hyperdense vessel. Embolization material compatible with prior right middle meningeal artery  embolization. Skull: Normal. Negative for fracture or focal lesion. Sinuses/Orbits: Orbits are symmetric. Mucosal thickening and possible mucous retention cyst in the left posterior ethmoid air cells. Mild mucosal thickening in the right ethmoid sinus. Other: Mastoid air cells are clear. IMPRESSION: No CT evidence of acute intracranial abnormality. Similar chronic right subdural hematoma. No significant mass effect or midline shift. Additional chronic changes as above. Electronically Signed   By: Denny Flack M.D.   On: 10/10/2023 20:45   DG Chest Port 1 View Result Date: 10/10/2023 CLINICAL DATA:  Generalized weakness. EXAM: PORTABLE CHEST 1 VIEW COMPARISON:  09/26/2023 FINDINGS: Heart size and pulmonary vascularity are normal. Lungs are clear. Soft tissue attenuation over the right apex.  No airspace disease or consolidation in the lungs. No pleural effusion or pneumothorax. Mediastinal contours appear intact. Multiple old bilateral rib fractures. Postoperative changes in the right shoulder. IMPRESSION: No active disease. Electronically Signed   By: Boyce Byes M.D.   On: 10/10/2023 20:00    Positive ROS: All other systems have been reviewed and were otherwise negative with the exception of those mentioned in the HPI and as above.  Physical Exam: General: Alert, no acute distress Cardiovascular: No pedal edema Respiratory: No cyanosis, no use of accessory musculature GI: No organomegaly, abdomen is soft and non-tender Skin: No lesions in the area of chief complaint. Multiple abrasions at different stages of healing throughout.  Neurologic: Sensation intact distally Psychiatric: Patient is competent for consent with normal mood and affect Lymphatic: No axillary or cervical lymphadenopathy  MUSCULOSKELETAL:   Examination of the LLE reveals no skin wounds or lesions at anterior hip, he does have some areas of healing at mid and distal thigh. Pain with palpation around trochanteric region. ER  and shortening noted.  Sensory and motor function intact in LE bilaterally. Distal pedal pulses 2+ bilaterally. Calves soft and non-tender. No significant pedal edema.   Assessment: Acute left subcapital femoral neck fracture.  ETOH abuse.  Tobacco abuse.  Recent GI bleed,  Chronic anemia.  Chronic subdural hematoma.   Plan: I discussed the findings with the patient. He has an acute left subcapital femoral neck fracture that is unstable. Dr. Charol Copas discussed the patient directly with Dr. Vann due to the medical complexity.  He will require surgical intervention for pain control and mobilization out of bed.  He is at high risk for perioperative medical and surgical complications due to his alcohol abuse and its sequela.  Dr. Mikle Alexanders plans to transfuse the patient 2 units PRBCs in preparation for surgery.  Patient is not a candidate for chemical DVT prophylaxis due to brain bleed, hypercoagulable state, and frequent falls.  Plan for left total hip arthroplasty tomorrow morning. NPO at midnight tonight. Hold chemical DVT ppx. All questions solicited and answered.     Harman Lightning, PA-C    10/11/2023 2:31 PM

## 2023-10-11 NOTE — Telephone Encounter (Signed)
 Left message advising ok to resume care, per other telephone note.

## 2023-10-11 NOTE — H&P (View-Only) (Signed)
 ORTHOPAEDIC CONSULTATION  REQUESTING PHYSICIAN: Enrigue Harvard, DO  PCP:  Cherre Cornish, NP  Chief Complaint: left hip pain  HPI: Richard Andrews is a 63 y.o. male who has PMH significant of hypertension, alcohol use disorder with history of severe alcohol withdrawal, cirrhosis, traumatic subdural hematoma, lower GI bleed and anemia. Consult received from Dr. Vann TR[facility] for left femoral neck fracture.  Patient came to the emergency department yesterday after having been in bed for 2 days without eating or drinking.  Once he was resuscitated, he was mobilized this morning with PT.  He was unable to participate with PT due to severe left hip pain.  X-ray was obtained of the left hip, revealing a displaced left femoral neck fracture.  Patient does not recall falling. 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  He was hospitalized earlier this month for a GI bleed.  He is chronically anemic. He currently reports left hip pain. Denies other injuries.   He reports he smokes 1 PPD, drinks 1 pint whiskey per day. He lives at home, he reports someone lives with him (did not say who). He normally does not use assistive devices.   Past Medical History:  Diagnosis Date   Allergy    Arthritis    Distal radius fracture, left    Hepatic steatosis    History of kidney stones    Hypertension    Kidney stone 02/16/2023   Substance abuse [facility])    Past Surgical History:  Procedure Laterality Date   BIOPSY  01/22/2021   Procedure: BIOPSY;  Surgeon: Annis Kinder, DO;  Location: [facility];  Service: Gastroenterology;;   CYSTOSCOPY W/ URETERAL STENT PLACEMENT Bilateral 01/23/2023   Procedure: 1. Cystoscopy 2. Bilateral  retrograde pyelogram with interpretation 3. bilateral ureteral stent placement (6x24 cm on the right, 6x26 cm on the left) 4. Fluoroscopy <1 hour with intraoperative interpretation;  Surgeon: Mallie Seal, MD;  Location: [facility] OR;  Service: Urology;  Laterality: Bilateral;    CYSTOSCOPY/URETEROSCOPY/HOLMIUM LASER/STENT PLACEMENT Bilateral 03/21/2023   Procedure: BILATERAL URETEROSCOPY/HOLMIUM LASER/STENT PLACEMENT;  Surgeon: Mallie Seal, MD;  Location: [facility] ORS;  Service: Urology;  Laterality: Bilateral;   ESOPHAGOGASTRODUODENOSCOPY (EGD) WITH PROPOFOL  N/A 01/22/2021   Procedure: ESOPHAGOGASTRODUODENOSCOPY (EGD) WITH PROPOFOL ;  Surgeon: Annis Kinder, DO;  Location: [facility];  Service: Gastroenterology;  Laterality: N/A;   I & D EXTREMITY Right 01/10/2020   Procedure: ORIF RIGHT WRIST;  Surgeon: Ronn Cohn, MD;  Location: [facility] OR;  Service: Orthopedics;  Laterality: Right;   IR ANGIO EXTERNAL CAROTID SEL EXT CAROTID UNI R MOD SED  10/04/2022   IR ANGIO INTRA EXTRACRAN SEL INTERNAL CAROTID UNI R MOD SED  09/30/2022   IR ANGIOGRAM FOLLOW UP STUDY  09/30/2022   IR NEURO EACH ADD'L AFTER BASIC UNI RIGHT (MS)  10/04/2022   IR TRANSCATH/EMBOLIZ  09/30/2022   NO PAST SURGERIES     OPEN REDUCTION INTERNAL FIXATION (ORIF) DISTAL RADIAL FRACTURE Left 01/28/2015   Procedure: OPEN REDUCTION INTERNAL FIXATION (ORIF) LEFT DISTAL RADIAL FRACTURE;  Surgeon: Wes Hamman, MD;  Location: [facility];  Service: Orthopedics;  Laterality: Left;   ORIF HUMERUS FRACTURE Right 09/27/2022   Procedure: OPEN REDUCTION INTERNAL FIXATION (ORIF) PROXIMAL HUMERUS FRACTURE;  Surgeon: Osa Blase, MD;  Location: [facility] OR;  Service: Orthopedics;  Laterality: Right;   RADIOLOGY WITH ANESTHESIA N/A 09/30/2022   Procedure: IR WITH ANESTHESIA MMA EMBOLIZATION;  Surgeon: Radiologist, Medication, MD;  Location: [facility] OR;  Service: Radiology;  Laterality: N/A;   TONSILLECTOMY     Social History   Socioeconomic History   Marital status: Legally Separated    Spouse name: Craige Dixon   Number of children: 5   Years of education: 12   Highest education level: Not on file  Occupational History   Occupation: Emergency planning/management officer  Tobacco Use   Smoking status: Every Day    Current packs/day:  0.50    Average packs/day: 0.5 packs/day for 20.3 years (10.2 ttl pk-yrs)    Types: Cigarettes    Start date: 2005   Smokeless tobacco: Never  Vaping Use   Vaping status: Never Used  Substance and Sexual Activity   Alcohol use: Yes    Alcohol/week: 16.0 - 20.0 standard drinks of alcohol    Types: 16 - 20 Standard drinks or equivalent per week    Comment: 2/5 a week   Drug use: Never   Sexual activity: Not Currently    Partners: Female  Other Topics Concern   Not on file  Social History Narrative   ** Merged History Encounter **       Social Drivers of Health   Financial Resource Strain: Not on file  Food Insecurity: Food Insecurity Present (09/26/2023)   Hunger Vital Sign    Worried About Running Out of Food in the Last Year: Often true    Ran Out of Food in the Last Year: Often true  Transportation Needs: Unmet Transportation Needs (09/26/2023)   PRAPARE - Administrator, Civil Service (Medical): Yes    Lack of Transportation (Non-Medical): No  Physical Activity: Not on file  Stress: Not on file  Social Connections: Unknown (08/01/2023)   Social Connection and Isolation Panel [NHANES]    Frequency of Communication with Friends and Family: Three times a week    Frequency of Social Gatherings with Friends and Family: Patient declined    Attends Religious Services: Patient declined    Active Member of Clubs or Organizations: Patient declined    Attends Banker Meetings: Never    Marital Status: Divorced  Recent Concern: Social Connections - Socially Isolated (07/06/2023)   Social Connection and Isolation Panel [NHANES]    Frequency of Communication with Friends and Family: Three times a week    Frequency of Social Gatherings with Friends and Family: Not on file    Attends Religious Services: Never    Database administrator or Organizations: No    Attends Banker Meetings: Never    Marital Status: Divorced   Family History  Problem  Relation Age of Onset   Heart disease Mother    Hyperlipidemia Mother    Skin cancer Mother    Cancer Father    Multiple myeloma Sister    Throat cancer Sister    No Known Allergies Prior to Admission medications   Medication Sig Start Date End Date Taking? Authorizing Provider  acetaminophen  (TYLENOL ) 500 MG tablet Take 500 mg by mouth every 6 (six) hours as needed for moderate pain (pain score 4-6).   Yes [provider]  folic acid  (FOLVITE ) 1 MG tablet Take 1 tablet (1 mg total) by mouth daily. Patient not taking: Reported on 10/11/2023 10/06/23   Verlyn Goad, MD  lactulose  (CHRONULAC ) 10 GM/15ML solution Take 30 mLs (20 g total) by mouth 2 (two) times daily. Patient not taking: Reported on 08/01/2023 07/15/23   Elester Grim, MD  magnesium  oxide (MAG-OX) 400 (240 Mg) MG tablet  Take 1 tablet (400 mg total) by mouth daily. Patient not taking: Reported on 10/11/2023 07/16/23   Pahwani, Rinka R, MD  Multiple Vitamin (MULTIVITAMIN WITH MINERALS) TABS tablet Take 1 tablet by mouth daily. Patient not taking: Reported on 10/11/2023 10/06/23   Verlyn Goad, MD  oxyCODONE  (OXY IR/ROXICODONE ) 5 MG immediate release tablet Take 1 tablet (5 mg total) by mouth every 6 (six) hours as needed for breakthrough pain. Patient not taking: Reported on 10/11/2023 08/09/23   Gherghe, Costin M, MD  pantoprazole  (PROTONIX ) 40 MG tablet Take 1 tablet (40 mg total) by mouth daily. Patient not taking: Reported on 10/11/2023 08/10/23   Gherghe, Costin M, MD  thiamine  (VITAMIN B-1) 100 MG tablet Take 1 tablet (100 mg total) by mouth daily. Patient not taking: Reported on 10/11/2023 10/06/23   Verlyn Goad, MD   DG Knee Left Port Result Date: 10/11/2023 CLINICAL DATA:  Left femoral neck fracture from a fall. Clinical concern for possible knee injury. EXAM: PORTABLE LEFT KNEE - 1-2 VIEW COMPARISON:  Left hip radiographs obtained earlier today. FINDINGS: No evidence of fracture, dislocation, or joint effusion.  No evidence of arthropathy or other focal bone abnormality. Soft tissues are unremarkable. IMPRESSION: Negative. Electronically Signed   By: Catherin Closs M.D.   On: 10/11/2023 11:15   DG HIP UNILAT WITH PELVIS 2-3 VIEWS LEFT Result Date: 10/11/2023 CLINICAL DATA:  [id number] Fall [id number] left hip pain EXAM: DG HIP (WITH OR WITHOUT PELVIS) 2-3V LEFT COMPARISON:  None Available. FINDINGS: Diffuse osteopenia. Impacted, mildly displaced, subcapital fracture of the left femoral neck. Moderate osteoarthritis of both hips. Soft tissues are unremarkable. IMPRESSION: Impacted, mildly displaced subcapital left femoral neck fracture. Electronically Signed   By: Rance Burrows M.D.   On: 10/11/2023 09:54   CT HEAD WO CONTRAST Result Date: 10/10/2023 CLINICAL DATA:  Mental status change EXAM: CT HEAD WITHOUT CONTRAST TECHNIQUE: Contiguous axial images were obtained from the base of the skull through the vertex without intravenous contrast. RADIATION DOSE REDUCTION: This exam was performed according to the departmental dose-optimization program which includes automated exposure control, adjustment of the mA and/or kV according to patient size and/or use of iterative reconstruction technique. COMPARISON:  CT head 10/06/2023. FINDINGS: Brain: Redemonstrated subdural hematoma over the right cerebral convexity measuring up to 5 mm in thickness which is unchanged since 10/06/2023. Additional small focus of subdural hemorrhage along the anterior falx is unchanged. No acute intracranial hemorrhage. No significant mass effect. No midline shift. No CT evidence to suggest completed large territory infarct. 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 Chronic microvascular ischemic changes and similar degree of parenchymal volume loss. Left middle cranial fossa arachnoid cyst. Vascular: Atherosclerosis of the carotid siphons. No hyperdense vessel. Embolization material compatible with prior right middle meningeal artery  embolization. Skull: Normal. Negative for fracture or focal lesion. Sinuses/Orbits: Orbits are symmetric. Mucosal thickening and possible mucous retention cyst in the left posterior ethmoid air cells. Mild mucosal thickening in the right ethmoid sinus. Other: Mastoid air cells are clear. IMPRESSION: No CT evidence of acute intracranial abnormality. Similar chronic right subdural hematoma. No significant mass effect or midline shift. Additional chronic changes as above. Electronically Signed   By: Denny Flack M.D.   On: 10/10/2023 20:45   DG Chest Port 1 View Result Date: 10/10/2023 CLINICAL DATA:  Generalized weakness. EXAM: PORTABLE CHEST 1 VIEW COMPARISON:  09/26/2023 FINDINGS: Heart size and pulmonary vascularity are normal. Lungs are clear. Soft tissue attenuation over the right apex.  No airspace disease or consolidation in the lungs. No pleural effusion or pneumothorax. Mediastinal contours appear intact. Multiple old bilateral rib fractures. Postoperative changes in the right shoulder. IMPRESSION: No active disease. Electronically Signed   By: Boyce Byes M.D.   On: 10/10/2023 20:00    Positive ROS: All other systems have been reviewed and were otherwise negative with the exception of those mentioned in the HPI and as above.  Physical Exam: General: Alert, no acute distress Cardiovascular: No pedal edema Respiratory: No cyanosis, no use of accessory musculature GI: No organomegaly, abdomen is soft and non-tender Skin: No lesions in the area of chief complaint. Multiple abrasions at different stages of healing throughout.  Neurologic: Sensation intact distally Psychiatric: Patient is competent for consent with normal mood and affect Lymphatic: No axillary or cervical lymphadenopathy  MUSCULOSKELETAL:   Examination of the LLE reveals no skin wounds or lesions at anterior hip, he does have some areas of healing at mid and distal thigh. Pain with palpation around trochanteric region. ER  and shortening noted.  Sensory and motor function intact in LE bilaterally. Distal pedal pulses 2+ bilaterally. Calves soft and non-tender. No significant pedal edema.   Assessment: Acute left subcapital femoral neck fracture.  ETOH abuse.  Tobacco abuse.  Recent GI bleed,  Chronic anemia.  Chronic subdural hematoma.   Plan: I discussed the findings with the patient. He has an acute left subcapital femoral neck fracture that is unstable. Dr. Charol Copas discussed the patient directly with Dr. Vann due to the medical complexity.  He will require surgical intervention for pain control and mobilization out of bed.  He is at high risk for perioperative medical and surgical complications due to his alcohol abuse and its sequela.  Dr. Mikle Alexanders plans to transfuse the patient 2 units PRBCs in preparation for surgery.  Patient is not a candidate for chemical DVT prophylaxis due to brain bleed, hypercoagulable state, and frequent falls.  Plan for left total hip arthroplasty tomorrow morning. NPO at midnight tonight. Hold chemical DVT ppx. All questions solicited and answered.     Harman Lightning, PA-C    10/11/2023 2:31 PM

## 2023-10-11 NOTE — Evaluation (Signed)
 Physical Therapy Evaluation Patient Details Name: CHIRSTOPHER SCHWARZ MRN: [id number] DOB: Oct 27, 1960 Today's Date: 10/11/2023  History of Present Illness  Richard Andrews is a 63 y.o. male presents 10/10/23 for evaluation of generalized weakness. Of note, pt admitted 4/15-4/24 for acute lower GI bleed and anemia and 4/25 patient visited the ED for alcohol intoxication and right shoulder pain; CT head was showing chronic stable right subdural hematoma and no acute findings; X-ray of shoulder was negative for acute bony abnormality. PMH: HTN, alcohol use disorder with history of severe alcohol withdrawal, cirrhosis, from last admission 4/15-4/24 patient found to have SDH, right Rib fractures 8,10,11, L1 anf T6 compression fractures.  Clinical Impression  Pt admitted with above diagnosis. Pt unsure of d/c plan, doesn't detail home, reports [name] assists but doesn't elaborate on what [name] assists with. Pt unable to detail life at home since d/c from hospital or coming to ED 4/25, reports may have fallen at home. Pt significantly limited by L hip pain on eval, passively tolerates knee flexion ~10 deg and hip abduction ~10 deg before yelling out in pain- notified MD and RN regarding hip pain. RN reports pt needing +2 assist to change linen earlier this morning which is vastly different than last admission when pt was ambulatory with assistance. Patient will benefit from continued inpatient follow up therapy, <3 hours/day. Pt currently with functional limitations due to the deficits listed below (see PT Problem List). Pt will benefit from acute skilled PT to increase their independence and safety with mobility to allow discharge.           If plan is discharge home, recommend the following: A lot of help with walking and/or transfers;A lot of help with bathing/dressing/bathroom;Assistance with cooking/housework;Assist for transportation;Help with stairs or ramp for entrance   Can travel by private vehicle    No    Equipment Recommendations Other (comment) (TBD)  Recommendations for Other Services       Functional Status Assessment Patient has had a recent decline in their functional status and demonstrates the ability to make significant improvements in function in a reasonable and predictable amount of time.     Precautions / Restrictions Precautions Precautions: Fall Recall of Precautions/Restrictions: Impaired Restrictions Weight Bearing Restrictions Per Provider Order: No      Mobility  Bed Mobility               General bed mobility comments: pt refuses due to pain    Transfers                        Ambulation/Gait                  Stairs            Wheelchair Mobility     Tilt Bed    Modified Rankin (Stroke Patients Only)       Balance                                             Pertinent Vitals/Pain Pain Assessment Pain Assessment: 0-10 Pain Score: 7  Pain Location: L hip Pain Descriptors / Indicators: Grimacing, Guarding, Moaning, Crying Pain Intervention(s): Limited activity within patient's tolerance, Monitored during session, Repositioned (notified MD and RN)    Home Living Family/patient expects to be discharged to:: Unsure  Additional Comments: Pt reports from home with ramp to enter, Freddy (brother in law) was present to assist him at last discharge, pt doesn't elaborate on d/c plan    Prior Function               Mobility Comments: pt states "maybe I fell" in regards to mobility since last hospital admission, per chart review pt states no AD use ADLs Comments: per chart review, pt ind with ADL/IADL; currently pt reports [name] was present to assist but doesn't elaborate on what Freddy assists with     Extremity/Trunk Assessment   Upper Extremity Assessment Upper Extremity Assessment: Defer to OT evaluation    Lower Extremity Assessment Lower Extremity  Assessment: RLE deficits/detail;LLE deficits/detail RLE Deficits / Details: AROM WFL, able to perform ankle pumps, heel slide, quad set, SLR and hip abduction without pain RLE Sensation: WNL RLE Coordination: WNL LLE Deficits / Details: ankle AROM WFL, passive knee flexion ~10 deg then stoping due to pain, passive hip abd ~10 deg then stopping due to pain       Communication        Cognition Arousal: Lethargic Behavior During Therapy: Flat affect                           PT - Cognition Comments: pt flat, needing stimulation to engage with therapy, aware he is in hospital, unable to state date, unable to detail time at home since d/c from hospital 4/24 and 4/25         Cueing       General Comments General comments (skin integrity, edema, etc.): noted brusing and scratches throughout limbs    Exercises     Assessment/Plan    PT Assessment Patient needs continued PT services  PT Problem List Decreased strength;Decreased activity tolerance;Decreased balance;Decreased mobility;Decreased cognition;Decreased knowledge of use of DME;Decreased safety awareness;Pain       PT Treatment Interventions DME instruction;Gait training;Functional mobility training;Therapeutic activities;Therapeutic exercise;Balance training;Neuromuscular re-education;Cognitive remediation;Patient/family education    PT Goals (Current goals can be found in the Care Plan section)  Acute Rehab PT Goals Patient Stated Goal: "hip to stop hurting" PT Goal Formulation: With patient Time For Goal Achievement: 10/25/23 Potential to Achieve Goals: Fair    Frequency Min 3X/week     Co-evaluation               AM-PAC PT "6 Clicks" Mobility  Outcome Measure Help needed turning from your back to your side while in a flat bed without using bedrails?: Total Help needed moving from lying on your back to sitting on the side of a flat bed without using bedrails?: Total Help needed moving to and  from a bed to a chair (including a wheelchair)?: Total Help needed standing up from a chair using your arms (e.g., wheelchair or bedside chair)?: Total Help needed to walk in hospital room?: Total Help needed climbing 3-5 steps with a railing? : Total 6 Click Score: 6    End of Session   Activity Tolerance: Patient limited by pain Patient left: in bed;with call bell/phone within reach Nurse Communication: Mobility status;Other (comment) (notified MD and RN regarding L hip pain) PT Visit Diagnosis: Muscle weakness (generalized) (M62.81);Pain Pain - Right/Left: Left Pain - part of body: Hip    Time: 8469-6295 PT Time Calculation (min) (ACUTE ONLY): 10 min   Charges:   PT Evaluation $PT Eval Low Complexity: 1 Low   PT General Charges $$  ACUTE PT VISIT: 1 Visit         Tori Jese Comella PT, DPT 10/11/23, 9:57 AM

## 2023-10-11 NOTE — ED Notes (Signed)
 CIWA moved to be every 6 hours. First CIWA completed and ativan  given at [phone] 10/11/23

## 2023-10-12 ENCOUNTER — Encounter (HOSPITAL_COMMUNITY)
Admission: EM | Disposition: A | Discharge status: Skilled Nursing Facility | Payer: Self-pay | Source: Home / Self Care | Attending: Internal Medicine

## 2023-10-12 ENCOUNTER — Inpatient Hospital Stay (HOSPITAL_COMMUNITY): Payer: Self-pay | Admitting: Anesthesiology

## 2023-10-12 ENCOUNTER — Other Ambulatory Visit: Payer: Self-pay

## 2023-10-12 ENCOUNTER — Inpatient Hospital Stay (HOSPITAL_COMMUNITY)

## 2023-10-12 ENCOUNTER — Encounter (HOSPITAL_COMMUNITY): Payer: Self-pay | Admitting: Internal Medicine

## 2023-10-12 DIAGNOSIS — F10930 Alcohol use, unspecified with withdrawal, uncomplicated: Secondary | ICD-10-CM | POA: Diagnosis not present

## 2023-10-12 DIAGNOSIS — R531 Weakness: Secondary | ICD-10-CM | POA: Diagnosis not present

## 2023-10-12 HISTORY — PX: TOTAL HIP ARTHROPLASTY: SHX124

## 2023-10-12 LAB — BASIC METABOLIC PANEL WITH GFR
Anion gap: 9 (ref 5–15)
BUN: 7 mg/dL — ABNORMAL LOW (ref 8–23)
CO2: 24 mmol/L (ref 22–32)
Calcium: 8.6 mg/dL — ABNORMAL LOW (ref 8.9–10.3)
Chloride: 97 mmol/L — ABNORMAL LOW (ref 98–111)
Creatinine, Ser: 0.61 mg/dL (ref 0.61–1.24)
GFR, Estimated: >60 mL/min (ref >60)
Glucose, Bld: 101 mg/dL — ABNORMAL HIGH (ref 70–99)
Potassium: 3.7 mmol/L (ref 3.5–5.1)
Sodium: 130 mmol/L — ABNORMAL LOW (ref 135–145)

## 2023-10-12 LAB — CBC
HCT: 35.9 % — ABNORMAL LOW (ref 39.0–52.0)
Hemoglobin: 12 g/dL — ABNORMAL LOW (ref 13.0–17.0)
MCH: 31.7 pg (ref 26.0–34.0)
MCHC: 33.4 g/dL (ref 30.0–36.0)
MCV: 95 fL (ref 80.0–100.0)
Platelets: 153 10*3/uL (ref 150–400)
RBC: 3.78 MIL/uL — ABNORMAL LOW (ref 4.22–5.81)
RDW: 18.6 % — ABNORMAL HIGH (ref 11.5–15.5)
WBC: 4.4 10*3/uL (ref 4.0–10.5)
nRBC: 0 % (ref 0.0–0.2)

## 2023-10-12 LAB — PREPARE RBC (CROSSMATCH)

## 2023-10-12 LAB — LACTIC ACID, PLASMA: Lactic Acid, Venous: 1.3 mmol/L (ref 0.5–1.9)

## 2023-10-12 SURGERY — ARTHROPLASTY, HIP, TOTAL, ANTERIOR APPROACH
Anesthesia: General | Site: Hip | Laterality: Left

## 2023-10-12 MED ORDER — DOCUSATE SODIUM 100 MG PO CAPS
100.0000 mg | ORAL_CAPSULE | Freq: Two times a day (BID) | ORAL | Status: DC
  Administered 2023-10-12 – 2023-10-20 (×14): 100 mg via ORAL
  Filled 2023-10-12 (×16): qty 1

## 2023-10-12 MED ORDER — VANCOMYCIN HCL 1000 MG IV SOLR
INTRAVENOUS | Status: AC
  Filled 2023-10-12: qty 20

## 2023-10-12 MED ORDER — CHLORHEXIDINE GLUCONATE 4 % EX SOLN
60.0000 mL | Freq: Once | CUTANEOUS | Status: AC
  Administered 2023-10-12: 4 via TOPICAL
  Filled 2023-10-12: qty 60

## 2023-10-12 MED ORDER — ACETAMINOPHEN 500 MG PO TABS
1000.0000 mg | ORAL_TABLET | Freq: Once | ORAL | Status: AC
  Administered 2023-10-12: 1000 mg via ORAL
  Filled 2023-10-12: qty 2

## 2023-10-12 MED ORDER — SUGAMMADEX SODIUM 200 MG/2ML IV SOLN
INTRAVENOUS | Status: DC | PRN
  Administered 2023-10-12: 200 mg via INTRAVENOUS

## 2023-10-12 MED ORDER — ALBUMIN HUMAN 5 % IV SOLN
INTRAVENOUS | Status: AC
  Filled 2023-10-12: qty 250

## 2023-10-12 MED ORDER — HYDROCODONE-ACETAMINOPHEN 5-325 MG PO TABS
1.0000 | ORAL_TABLET | ORAL | Status: DC | PRN
  Administered 2023-10-15 – 2023-10-16 (×3): 2 via ORAL
  Administered 2023-10-18: 1 via ORAL
  Filled 2023-10-12: qty 2
  Filled 2023-10-12: qty 1
  Filled 2023-10-12 (×2): qty 2

## 2023-10-12 MED ORDER — CEFAZOLIN SODIUM-DEXTROSE 2-4 GM/100ML-% IV SOLN
2.0000 g | INTRAVENOUS | Status: AC
Start: 2023-10-12 — End: 2023-10-12
  Administered 2023-10-12: 2 g via INTRAVENOUS
  Filled 2023-10-12: qty 100

## 2023-10-12 MED ORDER — OXYCODONE HCL 5 MG PO TABS: 5.0000 mg | ORAL_TABLET | Freq: Once | ORAL | Status: DC | PRN

## 2023-10-12 MED ORDER — DEXMEDETOMIDINE HCL IN NACL 80 MCG/20ML IV SOLN
INTRAVENOUS | Status: DC | PRN
Start: 2023-10-12 — End: 2023-10-12
  Administered 2023-10-12 (×2): 8 ug via INTRAVENOUS

## 2023-10-12 MED ORDER — PHENYLEPHRINE 80 MCG/ML (10ML) SYRINGE FOR IV PUSH (FOR BLOOD PRESSURE SUPPORT)
PREFILLED_SYRINGE | INTRAVENOUS | Status: AC
  Filled 2023-10-12: qty 10

## 2023-10-12 MED ORDER — METOCLOPRAMIDE HCL 5 MG PO TABS: 5.0000 mg | ORAL_TABLET | Freq: Three times a day (TID) | ORAL | Status: DC | PRN

## 2023-10-12 MED ORDER — FENTANYL CITRATE (PF) 100 MCG/2ML IJ SOLN
INTRAMUSCULAR | Status: AC
  Filled 2023-10-12: qty 2

## 2023-10-12 MED ORDER — METHOCARBAMOL 1000 MG/10ML IJ SOLN: 500.0000 mg | Freq: Four times a day (QID) | INTRAMUSCULAR | Status: DC | PRN

## 2023-10-12 MED ORDER — ROCURONIUM BROMIDE 10 MG/ML (PF) SYRINGE
PREFILLED_SYRINGE | INTRAVENOUS | Status: DC | PRN
  Administered 2023-10-12: 10 mg via INTRAVENOUS
  Administered 2023-10-12: 60 mg via INTRAVENOUS
  Administered 2023-10-12: 10 mg via INTRAVENOUS

## 2023-10-12 MED ORDER — BUPIVACAINE-EPINEPHRINE (PF) 0.25% -1:200000 IJ SOLN
INTRAMUSCULAR | Status: AC
  Filled 2023-10-12: qty 30

## 2023-10-12 MED ORDER — VANCOMYCIN HCL 1000 MG IV SOLR
INTRAVENOUS | Status: DC | PRN
  Administered 2023-10-12: 1000 mg via TOPICAL

## 2023-10-12 MED ORDER — CEFAZOLIN SODIUM-DEXTROSE 2-4 GM/100ML-% IV SOLN
2.0000 g | Freq: Four times a day (QID) | INTRAVENOUS | Status: AC
  Administered 2023-10-12 (×2): 2 g via INTRAVENOUS
  Filled 2023-10-12 (×2): qty 100

## 2023-10-12 MED ORDER — WATER FOR IRRIGATION, STERILE IR SOLN
Status: DC | PRN
  Administered 2023-10-12: 1000 mL

## 2023-10-12 MED ORDER — FENTANYL CITRATE (PF) 100 MCG/2ML IJ SOLN
INTRAMUSCULAR | Status: DC | PRN
Start: 2023-10-12 — End: 2023-10-12
  Administered 2023-10-12 (×2): 50 ug via INTRAVENOUS

## 2023-10-12 MED ORDER — PHENYLEPHRINE 80 MCG/ML (10ML) SYRINGE FOR IV PUSH (FOR BLOOD PRESSURE SUPPORT)
PREFILLED_SYRINGE | INTRAVENOUS | Status: DC | PRN
  Administered 2023-10-12 (×2): 120 ug via INTRAVENOUS
  Administered 2023-10-12: 160 ug via INTRAVENOUS
  Administered 2023-10-12: 80 ug via INTRAVENOUS

## 2023-10-12 MED ORDER — MIDAZOLAM HCL 2 MG/2ML IJ SOLN
INTRAMUSCULAR | Status: AC
  Filled 2023-10-12: qty 2

## 2023-10-12 MED ORDER — TRANEXAMIC ACID-NACL 1000-0.7 MG/100ML-% IV SOLN
1000.0000 mg | INTRAVENOUS | Status: AC
  Administered 2023-10-12: 1000 mg via INTRAVENOUS
  Filled 2023-10-12: qty 100

## 2023-10-12 MED ORDER — EPHEDRINE 5 MG/ML INJ
INTRAVENOUS | Status: AC
  Filled 2023-10-12: qty 5

## 2023-10-12 MED ORDER — SODIUM CHLORIDE (PF) 0.9 % IJ SOLN
INTRAMUSCULAR | Status: AC
  Filled 2023-10-12: qty 30

## 2023-10-12 MED ORDER — LIDOCAINE 2% (20 MG/ML) 5 ML SYRINGE
INTRAMUSCULAR | Status: DC | PRN
Start: 2023-10-12 — End: 2023-10-12
  Administered 2023-10-12: 60 mg via INTRAVENOUS

## 2023-10-12 MED ORDER — AMISULPRIDE (ANTIEMETIC) 5 MG/2ML IV SOLN: 10.0000 mg | Freq: Once | INTRAVENOUS | Status: DC | PRN

## 2023-10-12 MED ORDER — OXYCODONE HCL 5 MG/5ML PO SOLN: 5.0000 mg | Freq: Once | ORAL | Status: DC | PRN

## 2023-10-12 MED ORDER — HYDROMORPHONE HCL 1 MG/ML IJ SOLN: 0.2500 mg | INTRAMUSCULAR | Status: DC | PRN

## 2023-10-12 MED ORDER — KETOROLAC TROMETHAMINE 30 MG/ML IJ SOLN
INTRAMUSCULAR | Status: AC
  Filled 2023-10-12: qty 1

## 2023-10-12 MED ORDER — HYDROCODONE-ACETAMINOPHEN 7.5-325 MG PO TABS
1.0000 | ORAL_TABLET | ORAL | Status: DC | PRN
  Administered 2023-10-13: 1 via ORAL
  Administered 2023-10-13: 2 via ORAL
  Administered 2023-10-13: 1 via ORAL
  Administered 2023-10-14 – 2023-10-19 (×5): 2 via ORAL
  Filled 2023-10-12 (×2): qty 2
  Filled 2023-10-12: qty 1
  Filled 2023-10-12: qty 2
  Filled 2023-10-12: qty 1
  Filled 2023-10-12 (×3): qty 2

## 2023-10-12 MED ORDER — ISOPROPYL ALCOHOL 70 % SOLN
Status: DC | PRN
  Administered 2023-10-12: 1 via TOPICAL

## 2023-10-12 MED ORDER — KETAMINE HCL 10 MG/ML IJ SOLN
INTRAMUSCULAR | Status: DC | PRN
  Administered 2023-10-12: 30 mg via INTRAVENOUS
  Administered 2023-10-12: 20 mg via INTRAVENOUS

## 2023-10-12 MED ORDER — POVIDONE-IODINE 10 % EX SWAB
2.0000 | Freq: Once | CUTANEOUS | Status: DC
Start: 2023-10-12 — End: 2023-10-12

## 2023-10-12 MED ORDER — PROPOFOL 10 MG/ML IV BOLUS
INTRAVENOUS | Status: DC | PRN
  Administered 2023-10-12: 120 mg via INTRAVENOUS

## 2023-10-12 MED ORDER — SODIUM CHLORIDE 0.9 % IR SOLN
Status: DC | PRN
Start: 2023-10-12 — End: 2023-10-12
  Administered 2023-10-12: 1000 mL
  Administered 2023-10-12: 250 mL

## 2023-10-12 MED ORDER — SODIUM CHLORIDE 0.9 % IV SOLN: INTRAVENOUS | Status: DC

## 2023-10-12 MED ORDER — ENSURE ENLIVE PO LIQD
237.0000 mL | Freq: Two times a day (BID) | ORAL | Status: DC
  Administered 2023-10-13 – 2023-10-20 (×13): 237 mL via ORAL

## 2023-10-12 MED ORDER — SODIUM CHLORIDE (PF) 0.9 % IJ SOLN
INTRAMUSCULAR | Status: DC | PRN
  Administered 2023-10-12: 61 mL

## 2023-10-12 MED ORDER — SODIUM CHLORIDE 0.9% IV SOLUTION: Freq: Once | INTRAVENOUS | Status: DC

## 2023-10-12 MED ORDER — ISOPROPYL ALCOHOL 70 % SOLN
Status: AC
Start: 2023-10-12 — End: ?
  Filled 2023-10-12: qty 480

## 2023-10-12 MED ORDER — METOCLOPRAMIDE HCL 5 MG/ML IJ SOLN: 5.0000 mg | Freq: Three times a day (TID) | INTRAMUSCULAR | Status: DC | PRN

## 2023-10-12 MED ORDER — DIPHENHYDRAMINE HCL 12.5 MG/5ML PO ELIX: 12.5000 mg | ORAL_SOLUTION | ORAL | Status: DC | PRN

## 2023-10-12 MED ORDER — LACTATED RINGERS IV SOLN: INTRAVENOUS | Status: DC

## 2023-10-12 MED ORDER — MENTHOL 3 MG MT LOZG: 1.0000 | LOZENGE | OROMUCOSAL | Status: DC | PRN

## 2023-10-12 MED ORDER — KETAMINE HCL 50 MG/5ML IJ SOSY
PREFILLED_SYRINGE | INTRAMUSCULAR | Status: AC
  Filled 2023-10-12: qty 5

## 2023-10-12 MED ORDER — ALBUMIN HUMAN 5 % IV SOLN: INTRAVENOUS | Status: DC | PRN

## 2023-10-12 MED ORDER — ONDANSETRON HCL 4 MG/2ML IJ SOLN
INTRAMUSCULAR | Status: DC | PRN
  Administered 2023-10-12: 4 mg via INTRAVENOUS

## 2023-10-12 MED ORDER — PHENYLEPHRINE HCL-NACL 20-0.9 MG/250ML-% IV SOLN
INTRAVENOUS | Status: DC | PRN
Start: 2023-10-12 — End: 2023-10-12
  Administered 2023-10-12: 20 ug/min via INTRAVENOUS

## 2023-10-12 MED ORDER — METHOCARBAMOL 500 MG PO TABS
500.0000 mg | ORAL_TABLET | Freq: Four times a day (QID) | ORAL | Status: DC | PRN
  Administered 2023-10-14 – 2023-10-20 (×6): 500 mg via ORAL
  Filled 2023-10-12 (×6): qty 1

## 2023-10-12 MED ORDER — BISACODYL 10 MG RE SUPP: 10.0000 mg | Freq: Every day | RECTAL | Status: DC | PRN

## 2023-10-12 MED ORDER — DEXAMETHASONE SODIUM PHOSPHATE 10 MG/ML IJ SOLN
INTRAMUSCULAR | Status: DC | PRN
  Administered 2023-10-12: 8 mg via INTRAVENOUS

## 2023-10-12 MED ORDER — POLYETHYLENE GLYCOL 3350 17 G PO PACK: 17.0000 g | PACK | Freq: Every day | ORAL | Status: DC | PRN

## 2023-10-12 MED ORDER — PHENOL 1.4 % MT LIQD: 1.0000 | OROMUCOSAL | Status: DC | PRN

## 2023-10-12 MED ORDER — HYDROCODONE-ACETAMINOPHEN 5-325 MG PO TABS: 1.0000 | ORAL_TABLET | ORAL | 0 refills | Status: AC | PRN

## 2023-10-12 MED ORDER — MIDAZOLAM HCL 5 MG/5ML IJ SOLN
INTRAMUSCULAR | Status: DC | PRN
  Administered 2023-10-12: 2 mg via INTRAVENOUS

## 2023-10-12 MED ORDER — MORPHINE SULFATE (PF) 2 MG/ML IV SOLN: 0.5000 mg | INTRAVENOUS | Status: DC | PRN

## 2023-10-12 SURGICAL SUPPLY — 49 items
BAG COUNTER SPONGE SURGICOUNT (BAG) IMPLANT
BAG ZIPLOCK 12X15 (MISCELLANEOUS) IMPLANT
BIT DRILL RINGLOC QUICK CONN (BIT) IMPLANT
CHLORAPREP W/TINT 26 (MISCELLANEOUS) ×2 IMPLANT
COVER PERINEAL POST (MISCELLANEOUS) ×2 IMPLANT
COVER SURGICAL LIGHT HANDLE (MISCELLANEOUS) ×2 IMPLANT
DERMABOND ADVANCED .7 DNX12 (GAUZE/BANDAGES/DRESSINGS) ×4 IMPLANT
DRAPE IMP U-DRAPE 54X76 (DRAPES) ×2 IMPLANT
DRAPE SHEET LG 3/4 BI-LAMINATE (DRAPES) ×6 IMPLANT
DRAPE STERI IOBAN 125X83 (DRAPES) ×2 IMPLANT
DRAPE U-SHAPE 47X51 STRL (DRAPES) ×2 IMPLANT
DRSG AQUACEL AG ADV 3.5X10 (GAUZE/BANDAGES/DRESSINGS) ×2 IMPLANT
ELECT REM PT RETURN 15FT ADLT (MISCELLANEOUS) ×2 IMPLANT
GAUZE SPONGE 4X4 12PLY STRL (GAUZE/BANDAGES/DRESSINGS) ×2 IMPLANT
GLOVE BIO SURGEON STRL SZ7 (GLOVE) ×2 IMPLANT
GLOVE BIO SURGEON STRL SZ8.5 (GLOVE) ×4 IMPLANT
GLOVE BIOGEL PI IND STRL 7.5 (GLOVE) ×2 IMPLANT
GLOVE BIOGEL PI IND STRL 8.5 (GLOVE) ×2 IMPLANT
GOWN SPEC L3 XXLG W/TWL (GOWN DISPOSABLE) ×2 IMPLANT
GOWN STRL REUS W/ TWL XL LVL3 (GOWN DISPOSABLE) ×2 IMPLANT
HEAD CERAMIC BIOLOX 36 T1 +3 (Head) IMPLANT
HOOD PEEL AWAY T7 (MISCELLANEOUS) ×6 IMPLANT
KIT TURNOVER KIT A (KITS) IMPLANT
LINER NCONST LONG G 36 G7 +5 (Liner) IMPLANT
MANIFOLD NEPTUNE II (INSTRUMENTS) ×2 IMPLANT
MARKER SKIN DUAL TIP RULER LAB (MISCELLANEOUS) ×2 IMPLANT
NDL SAFETY ECLIPSE 18X1.5 (NEEDLE) ×2 IMPLANT
NDL SPNL 18GX3.5 QUINCKE PK (NEEDLE) ×2 IMPLANT
NEEDLE SPNL 18GX3.5 QUINCKE PK (NEEDLE) ×1 IMPLANT
PACK ANTERIOR HIP CUSTOM (KITS) ×2 IMPLANT
PENCIL SMOKE EVACUATOR (MISCELLANEOUS) ×2 IMPLANT
SAW OSC TIP CART 19.5X105X1.3 (SAW) ×2 IMPLANT
SCREW BONE 6.5X35 SELF TAP (Screw) IMPLANT
SEALER BIPOLAR AQUA 6.0 (INSTRUMENTS) ×2 IMPLANT
SET HNDPC FAN SPRY TIP SCT (DISPOSABLE) ×2 IMPLANT
SHELL ACET G7 4H 58 SZG HIP (Shell) IMPLANT
SOLUTION PRONTOSAN WOUND 350ML (IRRIGATION / IRRIGATOR) ×2 IMPLANT
SPIKE FLUID TRANSFER (MISCELLANEOUS) ×2 IMPLANT
STAPLER SKIN PROX WIDE 3.9 (STAPLE) IMPLANT
STEM FEMORAL 17X154 (Stem) IMPLANT
SUT MNCRL AB 3-0 PS2 18 (SUTURE) ×2 IMPLANT
SUT MON AB 2-0 CT1 36 (SUTURE) ×2 IMPLANT
SUT STRATAFIX 14 PDO 48 VLT (SUTURE) ×2 IMPLANT
SUT STRATAFIX PDO 1 14 VIOLET (SUTURE) ×1 IMPLANT
SUT VIC AB 2-0 CT1 TAPERPNT 27 (SUTURE) IMPLANT
SYR 3ML LL SCALE MARK (SYRINGE) ×2 IMPLANT
TOWEL GREEN STERILE FF (TOWEL DISPOSABLE) ×2 IMPLANT
TUBE SUCTION HIGH CAP CLEAR NV (SUCTIONS) ×2 IMPLANT
WATER STERILE IRR 1000ML POUR (IV SOLUTION) ×2 IMPLANT

## 2023-10-12 NOTE — [place] FL2 (Signed)
 [place]  MEDICAID FL2 LEVEL OF CARE FORM     IDENTIFICATION  Patient Name: Richard Andrews Birthdate: 1961/01/06 Sex: male Admission Date (Current Location): 10/10/2023  [facility] and [place] Number:  Producer, television/film/video and Address:  [facility],  [address], [place] [address]      Provider Number: [id number]  Attending Physician Name and Address:  Enrigue Harvard, DO  Relative Name and Phone Number:  Xaidyn Wenner [phone]    Current Level of Care: Hospital Recommended Level of Care: Skilled Nursing Facility Prior Approval Number:    Date Approved/Denied:   PASRR Number: [id number]  Discharge Plan: SNF    Current Diagnoses: Patient Active Problem List   Diagnosis Date Noted   Elevated lactic acid level 10/11/2023   Alcohol  withdrawal (HCC) 10/10/2023   Hypophosphatemia 10/05/2023   Pancytopenia (HCC) 10/05/2023   Alcohol  use disorder 10/05/2023   Liver cirrhosis (HCC) 10/05/2023   Macrocytic anemia 10/05/2023   Lower GI bleed 09/26/2023   Pancytopenia (HCC) 07/09/2023   Fever 07/07/2023   Right lateral 4th rib fracture 07/07/2023   Hypophosphatemia 07/05/2023   Apparent cirrhosis (HCC) 07/05/2023   Protein-calorie malnutrition, severe (HCC) 07/05/2023   Generalized weakness 07/05/2023   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   Kidney stone 02/16/2023   Colitis due to Clostridioides difficile 01/25/2023   History of seizure due to alcohol  withdrawal 01/23/2023   History of bleeding peptic ulcer 01/23/2023   Hyponatremia 01/22/2023   Lactic acidosis 01/22/2023   Hypokalemia 01/22/2023   Hypomagnesemia 01/22/2023   Physical deconditioning 01/22/2023   Hyperbilirubinemia 01/22/2023   Hepatic steatosis 01/22/2023   SDH (subdural hematoma) (HCC) 01/22/2023   Starvation and alcoholic ketosis 84/13/2440   Closed nondisplaced fracture of left tibial plateau 11/21/2022   ICH (intracerebral hemorrhage) (HCC) 11/15/2022   Subdural  hematoma (HCC) 10/07/2022   Fall 09/24/2022   Alcohol  withdrawal syndrome without complication (HCC)    Gastritis and gastroduodenitis    Multiple gastric ulcers    Duodenal ulcer    Acute blood loss anemia    Alcohol  withdrawal seizure with complication (HCC)    GI bleed 01/19/2021   QT prolongation 01/19/2021   Electrolyte imbalance 02/06/2020   Macrocytic anemia 02/06/2020   Acute pain of right wrist 02/06/2020   Encounter for orthopedic follow-up care 01/28/2020   Open Colles' fracture of right radius 01/10/2020   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   Increased ammonia level 12/21/2016   Thrombocytopenia (HCC) 12/21/2016   Macrocytosis 03/21/2016   Elevated lipase 03/21/2016   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   Alcohol  use disorder, moderate, dependence (HCC) 02/12/2016   Essential hypertension 01/22/2015    Orientation RESPIRATION BLADDER Height & Weight     Self, Time, Situation, Place  Normal Incontinent Weight: 124 lb 1.9 oz (56.3 kg) Height:  5\' 11"  (180.3 cm)  BEHAVIORAL SYMPTOMS/MOOD NEUROLOGICAL BOWEL NUTRITION STATUS      Continent Diet (regular)  AMBULATORY STATUS COMMUNICATION OF NEEDS Skin   Limited Assist Verbally Normal                       Personal Care Assistance Level of Assistance  Bathing, Feeding, Dressing Bathing Assistance: Limited assistance Feeding assistance: Independent Dressing Assistance: Limited assistance     Functional Limitations Info  Sight, Speech, Hearing Sight Info: Impaired Hearing Info: Adequate Speech Info: Adequate    SPECIAL CARE FACTORS FREQUENCY  PT (By licensed PT), OT (By licensed OT)     PT Frequency: 5x/wk  OT Frequency: 5x/wk            Contractures Contractures Info: Not present    Additional Factors Info  Code Status, Allergies Code Status Info: Full Code Allergies Info: No Known Allergies           Current Medications (10/12/2023):  This is the current hospital active medication list Current  Facility-Administered Medications  Medication Dose Route Frequency Provider Last Rate Last Admin   [MAR Hold] acetaminophen  (TYLENOL ) tablet 650 mg  650 mg Oral Q6H PRN Juliette Oh, MD       Or   Evette Hoes Hold] acetaminophen  (TYLENOL ) suppository 650 mg  650 mg Rectal Q6H PRN Juliette Oh, MD       bupivacaine -epinephrine  (PF) (MARCAINE  W/ EPI) 0.25% -1:200000 30 mL, ketorolac  (TORADOL ) 30 MG/ML 30 mg, sodium chloride  (PF) 0.9 % 30 mL    PRN Adonica Hoose, MD   61 mL at 10/12/23 1111   [MAR Hold] chlordiazePOXIDE  (LIBRIUM ) capsule 25 mg  25 mg Oral TID Rathore, Vasundhra, MD       Followed by   Evette Hoes Hold] chlordiazePOXIDE  (LIBRIUM ) capsule 25 mg  25 mg Oral BH-qamhs Rathore, Vasundhra, MD       Followed by   Evette Hoes Hold] chlordiazePOXIDE  (LIBRIUM ) capsule 25 mg  25 mg Oral Daily Rathore, Vasundhra, MD       [MAR Hold] Chlorhexidine  Gluconate Cloth 2 % PADS 6 each  6 each Topical Daily Vann, Jessica U, DO   6 each at 10/12/23 0732   [MAR Hold] folic acid  (FOLVITE ) tablet 1 mg  1 mg Oral Daily Rathore, Vasundhra, MD   1 mg at 10/11/23 1046   [MAR Hold] hydrALAZINE  (APRESOLINE ) injection 5 mg  5 mg Intravenous Q6H PRN Rathore, Vasundhra, MD   5 mg at 10/11/23 1555   isopropyl alcohol  70 % external solution    PRN Adonica Hoose, MD   1 Application at 10/12/23 1112   lactated ringers  infusion   Intravenous Continuous HillPhilippa Bray, PA-C 40 mL/hr at 10/12/23 1011 Continued from Pre-op at 10/12/23 1011   [MAR Hold] LORazepam  (ATIVAN ) tablet 1-4 mg  1-4 mg Oral Q1H PRN Rathore, Vasundhra, MD   3 mg at 10/11/23 0315   Or   [MAR Hold] LORazepam  (ATIVAN ) injection 1-4 mg  1-4 mg Intravenous Q1H PRN Rathore, Vasundhra, MD       [MAR Hold] morphine  (PF) 2 MG/ML injection 1 mg  1 mg Intravenous Q3H PRN Vann, Jessica U, DO   1 mg at 10/12/23 0456   [MAR Hold] multivitamin with minerals tablet 1 tablet  1 tablet Oral Daily Rathore, Vasundhra, MD   1 tablet at 10/11/23 1046   [MAR Hold] Oral care  mouth rinse  15 mL Mouth Rinse PRN Vann, Jessica U, DO       [MAR Hold] oxyCODONE  (Oxy IR/ROXICODONE ) immediate release tablet 5 mg  5 mg Oral Q6H PRN Vann, Jessica U, DO   5 mg at 10/12/23 0738   [MAR Hold] pantoprazole  (PROTONIX ) EC tablet 40 mg  40 mg Oral Daily Vann, Jessica U, DO   40 mg at 10/11/23 1556   povidone-iodine  10 % swab 2 Application  2 Application Topical Once Hill, Avery S, PA-C       sodium chloride  irrigation 0.9 %    PRN Adonica Hoose, MD   250 mL at 10/12/23 1112   [MAR Hold] thiamine  (VITAMIN B1) tablet 100 mg  100 mg Oral Daily Rathore, Vasundhra, MD   100 mg  at 10/11/23 1046   Or   [MAR Hold] thiamine  (VITAMIN B1) injection 100 mg  100 mg Intravenous Daily Juliette Oh, MD   100 mg at 10/12/23 0981   water  for irrigation, sterile for irrigation SOLN    PRN Adonica Hoose, MD   1,000 mL at 10/12/23 1113   Facility-Administered Medications Ordered in Other Encounters  Medication Dose Route Frequency Provider Last Rate Last Admin   albumin  human 5 % solution   Intravenous Continuous PRN Key, Kristopher, CRNA   Stopped at 10/12/23 1110   dexamethasone  (DECADRON ) injection   Intravenous Anesthesia Intra-op Key, Kristopher, CRNA   8 mg at 10/12/23 1035   dexmedetomidine  (PRECEDEX ) 80 MCG/20ML   Intravenous Anesthesia Intra-op Key, Kristopher, CRNA   8 mcg at 10/12/23 1117   fentaNYL  (SUBLIMAZE ) injection   Intravenous Anesthesia Intra-op Key, Kristopher, CRNA   50 mcg at 10/12/23 1034   ketamine  (KETALAR ) injection   Intravenous Anesthesia Intra-op Key, Kristopher, CRNA   20 mg at 10/12/23 1108   lidocaine  2% (20 mg/mL) 5 mL syringe   Intravenous Anesthesia Intra-op Key, Kristopher, CRNA   60 mg at 10/12/23 1017   midazolam  (VERSED ) 5 MG/5ML injection   Intravenous Anesthesia Intra-op Key, Kristopher, CRNA   2 mg at 10/12/23 1012   ondansetron  (ZOFRAN ) injection   Intravenous Anesthesia Intra-op Key, Kristopher, CRNA   4 mg at 10/12/23 1012   PHENYLephrine  80 mcg/ml  in normal saline Adult IV Push Syringe (For Blood Pressure Support)   Intravenous Anesthesia Intra-op Key, Kristopher, CRNA   80 mcg at 10/12/23 1053   propofol  (DIPRIVAN ) 10 mg/mL bolus/IV push   Intravenous Anesthesia Intra-op Key, Kristopher, CRNA   120 mg at 10/12/23 1017   rocuronium  (ZEMURON ) injection   Intravenous Anesthesia Intra-op Key, Kristopher, CRNA   60 mg at 10/12/23 1017     Discharge Medications: Please see discharge summary for a list of discharge medications.  Relevant Imaging Results:  Relevant Lab Results:   Additional Information SSN [id number]  [name], LCSW

## 2023-10-12 NOTE — Op Note (Signed)
 OPERATIVE REPORT  SURGEON: Adonica Hoose, MD   ASSISTANT: Trixie Furnace, PA-C  PREOPERATIVE DIAGNOSIS: Displaced Left femoral neck fracture.   POSTOPERATIVE DIAGNOSIS: Displaced Left femoral neck fracture.   PROCEDURE: Left total hip arthroplasty, anterior approach.   IMPLANTS: Biomet Taperloc Reduced Distal stem, size 17 x 145 mm, high offset. Biomet G7 OsseoTi Cup, size 58 mm. Biomet Vivacit-E liner, size 36 mm, G, +5 mm neutral. Biomet Biolox ceramic head ball, size 36 + 3 mm. 6.5 x 35 mm bone screw.  ANESTHESIA:  General  ANTIBIOTICS: 2g ancef .  ESTIMATED BLOOD LOSS:-900 mL    DRAINS: None.  COMPLICATIONS: None   CONDITION: PACU - hemodynamically stable.   BRIEF CLINICAL NOTE: Richard Andrews is a 63 y.o. male with a displaced Left femoral neck fracture. The patient was admitted to the hospitalist service and underwent perioperative risk stratification and medical optimization. The risks, benefits, and alternatives to total hip arthroplasty were explained, and the patient elected to proceed.  PROCEDURE IN DETAIL: The patient was taken to the operating room and general anesthesia was induced on the hospital bed.  The patient was then positioned on the Hana table.  All bony prominences were well padded.  The hip was prepped and draped in the normal sterile surgical fashion.  A time-out was called verifying side and site of surgery. Antibiotics were given within 60 minutes of beginning the procedure.   Bikini incision was made, and the direct anterior approach to the hip was performed through the Hueter interval.  Lateral femoral circumflex vessels were treated with the Auqumantys. The anterior capsule was exposed and an inverted T capsulotomy was made.  Fracture hematoma was encountered and evacuated. The patient was found to have a comminuted Left subcapital femoral neck fracture.  I freshened the femoral neck cut with a saw.  I removed the femoral neck fragment.  A corkscrew was  placed into the head and the head was removed.  This was passed to the back table and was measured. The pubofemoral ligament was released subperiosteally to the lesser trochanter.  Acetabular exposure was achieved, and the pulvinar and labrum were excised. Sequential reaming of the acetabulum was then performed up to a size 57 mm reamer under direct visulization. A 58 mm cup was then opened and impacted into place at approximately 42 degrees of abduction and 20 degrees of anteversion. I augmented the already acceptable press fit fixation with a 6.5 mm bone screw.  The final polyethylene liner was impacted into place and acetabular osteophytes were removed.    I then gained femoral exposure taking care to protect the abductors and greater trochanter.  This was performed using standard external rotation, extension, and adduction.  A cookie cutter was used to enter the femoral canal, and then the femoral canal finder was placed.  Sequential broaching was performed up to a size 17.  Calcar planer was used on the femoral neck remnant.  I placed a high offset neck and a trial head ball.  The hip was reduced.  Leg lengths and offset were checked fluoroscopically.  The hip was dislocated and trial components were removed.  The final implants were placed, and the hip was reduced.  Fluoroscopy was used to confirm component position and leg lengths.  Lengthening of about 8 mm was required to restore adequate soft tissue tension.  At 90 degrees of external rotation and full extension, the hip was stable to an anterior directed force.   The wound was copiously irrigated with Prontosan  solution and normal saline using pule lavage.  Marcaine  solution was injected into the periarticular soft tissue.  The wound was closed in layers using #1 Stratafix for the fascia, 2-0 Vicryl for the subcutaneous fat, 2-0 Monocryl for the deep dermal layer, 3-0 running Monocryl subcuticular stitch, and Dermabond for the skin.  Once the glue  was fully dried, an Aquacell Ag dressing was applied.  The patient was transported to the recovery room in stable condition.  Sponge, needle, and instrument counts were correct at the end of the case x2.  The patient tolerated the procedure well and there were no known complications.  Please note that a surgical assistant was a medical necessity for this procedure to perform it in a safe and expeditious manner. Assistant was necessary to provide appropriate retraction of vital neurovascular structures, to prevent femoral fracture, and to allow for anatomic placement of the prosthesis.

## 2023-10-12 NOTE — Plan of Care (Signed)
  Problem: Health Behavior/Discharge Planning: Goal: Ability to manage health-related needs will improve Outcome: Progressing   Problem: Clinical Measurements: Goal: Ability to maintain clinical measurements within normal limits will improve Outcome: Progressing Goal: Will remain free from infection Outcome: Progressing Goal: Diagnostic test results will improve Outcome: Progressing Goal: Respiratory complications will improve Outcome: Progressing Goal: Cardiovascular complication will be avoided Outcome: Progressing   Problem: Nutrition: Goal: Adequate nutrition will be maintained Outcome: Progressing   

## 2023-10-12 NOTE — Plan of Care (Signed)
  Problem: Health Behavior/Discharge Planning: Goal: Ability to manage health-related needs will improve Outcome: Progressing   Problem: Clinical Measurements: Goal: Ability to maintain clinical measurements within normal limits will improve Outcome: Progressing Goal: Will remain free from infection Outcome: Progressing Goal: Diagnostic test results will improve Outcome: Progressing Goal: Respiratory complications will improve Outcome: Progressing Goal: Cardiovascular complication will be avoided Outcome: Progressing   Problem: Activity: Goal: Risk for activity intolerance will decrease Outcome: Progressing   Problem: Nutrition: Goal: Adequate nutrition will be maintained Outcome: Progressing   Problem: Coping: Goal: Level of anxiety will decrease Outcome: Progressing   Problem: Pain Managment: Goal: General experience of comfort will improve and/or be controlled Outcome: Progressing   Problem: Safety: Goal: Ability to remain free from injury will improve Outcome: Progressing   Problem: Skin Integrity: Goal: Risk for impaired skin integrity will decrease Outcome: Progressing

## 2023-10-12 NOTE — Progress Notes (Signed)
 PROGRESS NOTE    Richard Andrews  NGE:[id number] DOB: [birth year] DOA: 10/10/2023 PCP: Cherre Cornish, NP    Brief Narrative:   Richard Andrews is a 63 y.o. male with medical history significant of hypertension, alcohol  use disorder with history of severe alcohol  withdrawal, cirrhosis, traumatic subdural hematoma.  Patient was recently admitted to the hospital 4/15-4/24 for acute lower GI bleed and anemia.  GI was consulted and deferred endoscopic evaluation.  Lower GI bleed resolved spontaneously.  Outpatient hematology follow-up was recommended for pancytopenia.  Patient could not be discharged to SNF so he was discharged home with home health services.  The day after discharge 4/25 patient visited the ED for alcohol  intoxication and right shoulder pain.  CT head was showing chronic stable right subdural hematoma and no acute findings.  X-ray of shoulder was negative for acute bony abnormality.  Patient was discharged from the ED.   Patient presents to the ED today via EMS for evaluation of generalized weakness.  Patient was laying in his bed for the past 2 days as he was too weak to get up.   Found to have a left hip fracture.  Hip replacement planned for 5/1  Assessment and Plan:  Left hip fracture -ortho ([place]) consulted -bed rest -unable to anticoagulate patient as too high risk with brain bleed and recheck GI bleeding OR on 5/1 - Given 2 units of packed red blood cells prior to procedure  Alcohol  withdrawal/Alcohol  use disorder, severe, dependence -Ethanol level 248 (previously as high as 370s).   -Symptoms improved after Librium  and Ativan .   -Placed on CIWA protocol; Ativan  as needed -Continue Librium  taper.   Elevated lactate Likely due to chronic alcohol  use and dehydration.  No fever or leukocytosis to suggest infection/sepsis.   Hypokalemia and hypomagnesemia Mild QT prolongation -repleted     Hypertension Blood pressure elevated and he does not take any medications at  home.  IV hydralazine  PRN SBP >160.   Cirrhosis No signs of hepatic encephalopathy and ascites at this time.   Chronic anemia -s/p 2 units PRBC pre operatively     DVT prophylaxis: SCDs Start: 10/11/23 0002    Code Status: Full Code   Disposition Plan:  Level of care: Stepdown Status is: Inpatient Remains inpatient appropriate     Consultants:  ortho   Subjective: On the way to the OR  Objective: Vitals:   10/12/23 0500 10/12/23 0700 10/12/23 0800 10/12/23 0901  BP: (!) 144/87 138/88 125/84 118/88  Pulse: 92 77 79 79  Resp: 11 16 12 13   Temp:   98.6 F (37 C) 98.7 F (37.1 C)  TempSrc:   Oral Oral  SpO2: 96% 96% 95% 95%  Weight:    56.3 kg  Height:    5\' 11"  (1.803 m)    Intake/Output Summary (Last 24 hours) at 10/12/2023 1238 Last data filed at 10/12/2023 1213 Gross per 24 hour  Intake 2298.24 ml  Output 2400 ml  Net -101.76 ml   Filed Weights   10/11/23 1427 10/12/23 0901  Weight: 56.3 kg 56.3 kg    Examination:   General: Appearance:    Thin male in no acute distress     Lungs:    respirations unlabored  Heart:    Normal heart rate.     MS:   All extremities are intact.    Neurologic:   Awake, alert        Data Reviewed: I have personally reviewed following labs and imaging studies  CBC: Recent Labs  Lab 10/10/23 2010 10/11/23 0425 10/12/23 0313  WBC 5.4 3.8* 4.4  NEUTROABS 3.6  --   --   HGB 9.9* 8.4* 12.0*  HCT 29.4* 26.1* 35.9*  MCV 98.0 100.8* 95.0  PLT 224 157 153   Basic Metabolic Panel: Recent Labs  Lab 10/10/23 2010 10/11/23 0425 10/12/23 0313  NA 135 136 130*  K 3.3* 3.4* 3.7  CL 101 102 97*  CO2 21* 23 24  GLUCOSE 116* 105* 101*  BUN 7* 6* 7*  CREATININE 0.53* 0.42* 0.61  CALCIUM  8.5* 8.0* 8.6*  MG 1.3* 1.9  --    GFR: Estimated Creatinine Clearance: 74.3 mL/min (by C-G formula based on SCr of 0.61 mg/dL). Liver Function Tests: Recent Labs  Lab 10/10/23 2010  AST 35  ALT 18  ALKPHOS 110  BILITOT 0.8   PROT 7.2  ALBUMIN  3.4*   No results for input(s): "LIPASE", "AMYLASE" in the last 168 hours. Recent Labs  Lab 10/10/23 2009  AMMONIA 25   Coagulation Profile: No results for input(s): "INR", "PROTIME" in the last 168 hours. Cardiac Enzymes: No results for input(s): "CKTOTAL", "CKMB", "CKMBINDEX", "TROPONINI" in the last 168 hours. BNP (last 3 results) No results for input(s): "PROBNP" in the last 8760 hours. HbA1C: No results for input(s): "HGBA1C" in the last 72 hours. CBG: No results for input(s): "GLUCAP" in the last 168 hours. Lipid Profile: No results for input(s): "CHOL", "HDL", "LDLCALC", "TRIG", "CHOLHDL", "LDLDIRECT" in the last 72 hours. Thyroid Function Tests: No results for input(s): "TSH", "T4TOTAL", "FREET4", "T3FREE", "THYROIDAB" in the last 72 hours. Anemia Panel: No results for input(s): "VITAMINB12", "FOLATE", "FERRITIN", "TIBC", "IRON", "RETICCTPCT" in the last 72 hours. Sepsis Labs: Recent Labs  Lab 10/10/23 2014 10/10/23 2320 10/11/23 0025 10/12/23 0313  LATICACIDVEN 4.9* 4.0* 3.4* 1.3    Recent Results (from the past 240 hours)  MRSA Next Gen by PCR, Nasal     Status: None   Collection Time: 10/11/23  2:25 PM   Specimen: Nasal Mucosa; Nasal Swab  Result Value Ref Range Status   MRSA by PCR Next Gen NOT DETECTED NOT DETECTED Final    Comment: (NOTE) The GeneXpert MRSA Assay (FDA approved for NASAL specimens only), is one component of a comprehensive MRSA colonization surveillance program. It is not intended to diagnose MRSA infection nor to guide or monitor treatment for MRSA infections. Test performance is not FDA approved in patients less than 60 years old. Performed at [facility], [address] [place], [name] [address]          Radiology Studies: DG Knee Left Port Result Date: 10/11/2023 CLINICAL DATA:  Left femoral neck fracture from a fall. Clinical concern for possible knee injury. EXAM: PORTABLE LEFT KNEE  - 1-2 VIEW COMPARISON:  Left hip radiographs obtained earlier today. FINDINGS: No evidence of fracture, dislocation, or joint effusion. No evidence of arthropathy or other focal bone abnormality. Soft tissues are unremarkable. IMPRESSION: Negative. Electronically Signed   By: Catherin Closs M.D.   On: 10/11/2023 11:15   DG HIP UNILAT WITH PELVIS 2-3 VIEWS LEFT Result Date: 10/11/2023 CLINICAL DATA:  [id number] Fall [id number] left hip pain EXAM: DG HIP (WITH OR WITHOUT PELVIS) 2-3V LEFT COMPARISON:  None Available. FINDINGS: Diffuse osteopenia. Impacted, mildly displaced, subcapital fracture of the left femoral neck. Moderate osteoarthritis of both hips. Soft tissues are unremarkable. IMPRESSION: Impacted, mildly displaced subcapital left femoral neck fracture. Electronically Signed   By: Jodelle Mungo.D.  On: 10/11/2023 09:54   CT HEAD WO CONTRAST Result Date: 10/10/2023 CLINICAL DATA:  Mental status change EXAM: CT HEAD WITHOUT CONTRAST TECHNIQUE: Contiguous axial images were obtained from the base of the skull through the vertex without intravenous contrast. RADIATION DOSE REDUCTION: This exam was performed according to the departmental dose-optimization program which includes automated exposure control, adjustment of the mA and/or kV according to patient size and/or use of iterative reconstruction technique. COMPARISON:  CT head 10/06/2023. FINDINGS: Brain: Redemonstrated subdural hematoma over the right cerebral convexity measuring up to 5 mm in thickness which is unchanged since 10/06/2023. Additional small focus of subdural hemorrhage along the anterior falx is unchanged. No acute intracranial hemorrhage. No significant mass effect. No midline shift. No CT evidence to suggest completed large territory infarct. 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 Chronic microvascular ischemic changes and similar degree of parenchymal volume loss. Left middle cranial fossa arachnoid cyst. Vascular:  Atherosclerosis of the carotid siphons. No hyperdense vessel. 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 Skull: Normal. Negative for fracture or focal lesion. Sinuses/Orbits: Orbits are symmetric. Mucosal thickening and possible mucous retention cyst in the left posterior ethmoid air cells. Mild mucosal thickening in the right ethmoid sinus. Other: Mastoid air cells are clear. IMPRESSION: No CT evidence of acute intracranial abnormality. Similar chronic right subdural hematoma. No significant mass effect or midline shift. Additional chronic changes as above. Electronically Signed   By: Denny Flack M.D.   On: 10/10/2023 20:45   DG Chest Port 1 View Result Date: 10/10/2023 CLINICAL DATA:  Generalized weakness. EXAM: PORTABLE CHEST 1 VIEW COMPARISON:  09/26/2023 FINDINGS: Heart size and pulmonary vascularity are normal. Lungs are clear. Soft tissue attenuation over the right apex. No airspace disease or consolidation in the lungs. No pleural effusion or pneumothorax. Mediastinal contours appear intact. Multiple old bilateral rib fractures. Postoperative changes in the right shoulder. IMPRESSION: No active disease. Electronically Signed   By: Boyce Byes M.D.   On: 10/10/2023 20:00        Scheduled Meds:  sodium chloride    Intravenous Once   [MAR Hold] chlordiazePOXIDE   25 mg Oral TID   Followed by   Evette Hoes Hold] chlordiazePOXIDE   25 mg Oral BH-qamhs   Followed by   [facility] Hold] chlordiazePOXIDE   25 mg Oral Daily   [MAR Hold] Chlorhexidine  Gluconate Cloth  6 each Topical Daily   [MAR Hold] folic acid   1 mg Oral Daily   [MAR Hold] multivitamin with minerals  1 tablet Oral Daily   [MAR Hold] pantoprazole   40 mg Oral Daily   povidone-iodine   2 Application Topical Once   [MAR Hold] thiamine   100 mg Oral Daily   Or   [MAR Hold] thiamine   100 mg Intravenous Daily   Continuous Infusions:  lactated ringers  40 mL/hr at 10/12/23 1011     LOS: 2 days     Time spent: 45 minutes spent on chart review, discussion with nursing staff, consultants, updating family and interview/physical exam; more than 50% of that time was spent in counseling and/or coordination of care.    Richard Harvard, DO [facility] Available via Epic secure chat 7am-7pm After these hours, please refer to coverage provider listed on [email or web address] 10/12/2023, 12:38 PM

## 2023-10-12 NOTE — Discharge Instructions (Signed)

## 2023-10-12 NOTE — Anesthesia Preprocedure Evaluation (Signed)
 Anesthesia Evaluation  Patient identified by MRN, date of birth, ID band Patient awake    Reviewed: Allergy & Precautions, NPO status , Patient's Chart, lab work & pertinent test results  History of Anesthesia Complications Negative for: history of anesthetic complications  Airway Mallampati: II  TM Distance: >3 FB Neck ROM: Full    Dental  (+) Dental Advisory Given, Missing, Poor Dentition, Chipped   Pulmonary Current Smoker and Patient abstained from smoking.   Pulmonary exam normal        Cardiovascular hypertension, Normal cardiovascular exam   '24 TTE - EF 50 to 55%. Trivial MR.    Neuro/Psych Seizures -, Well Controlled,   Hx ICH   negative psych ROS   GI/Hepatic PUD,,,(+)     substance abuse  alcohol  use  Endo/Other  negative endocrine ROS    Renal/GU negative Renal ROS     Musculoskeletal  (+) Arthritis ,    Abdominal   Peds  Hematology  (+) Blood dyscrasia, anemia  Plt 120k    Anesthesia Other Findings   Reproductive/Obstetrics                             Anesthesia Physical Anesthesia Plan  ASA: 3  Anesthesia Plan: General   Post-op Pain Management:    Induction: Intravenous  PONV Risk Score and Plan: 1 and Treatment may vary due to age or medical condition and Ondansetron   Airway Management Planned: Oral ETT  Additional Equipment: None  Intra-op Plan:   Post-operative Plan: Extubation in OR  Informed Consent: I have reviewed the patients History and Physical, chart, labs and discussed the procedure including the risks, benefits and alternatives for the proposed anesthesia with the patient or authorized representative who has indicated his/her understanding and acceptance.     Dental advisory given  Plan Discussed with: CRNA and Anesthesiologist  Anesthesia Plan Comments: (See PAT note )        Anesthesia Quick Evaluation

## 2023-10-12 NOTE — Interval H&P Note (Signed)
 History and Physical Interval Note:  10/12/2023 9:32 AM  Allison Arena  has presented today for surgery, with the diagnosis of LEFT FEMORAL NECK FRACTURE.  The various methods of treatment have been discussed with the patient and family. After consideration of risks, benefits and other options for treatment, the patient has consented to  Procedure(s): ARTHROPLASTY, HIP, TOTAL, ANTERIOR APPROACH (Left) as a surgical intervention.  The patient's history has been reviewed, patient examined, no change in status, stable for surgery.  I have reviewed the patient's chart and labs.  Questions were answered to the patient's satisfaction.    The risks, benefits, and alternatives were discussed with the patient. There are risks associated with the surgery including, but not limited to, problems with anesthesia (death), infection, instability (giving out of the joint), dislocation, differences in leg length/angulation/rotation, fracture of bones, loosening or failure of implants, hematoma (blood accumulation) which may require surgical drainage, blood clots, pulmonary embolism, nerve injury (foot drop and lateral thigh numbness), and blood vessel injury. The patient understands these risks and elects to proceed.    Margart Shears Jarika Robben

## 2023-10-12 NOTE — Progress Notes (Signed)
 OT Cancellation Note  Patient Details Name: Richard Andrews MRN: [id number] DOB: 15-Jun-1960   Cancelled Treatment:    Reason Eval/Treat Not Completed: Medical issues which prohibited therapy. Surgery planned today for L femoral neck fracture.   Sheralyn Dies, OTR/L 10/12/2023, 9:00 AM

## 2023-10-12 NOTE — Anesthesia Procedure Notes (Signed)
 Procedure Name: Intubation Date/Time: 10/12/2023 10:37 AM  Performed by: Erleen Hawking, CRNAPre-anesthesia Checklist: Patient identified, Emergency Drugs available, Suction available, Patient being monitored and Timeout performed Patient Re-evaluated:Patient Re-evaluated prior to induction Oxygen Delivery Method: Circle system utilized Preoxygenation: Pre-oxygenation with 100% oxygen Induction Type: IV induction Ventilation: Mask ventilation without difficulty Laryngoscope Size: Mac and 4 Grade View: Grade II Tube type: Oral Tube size: 7.0 mm Number of attempts: 1 Airway Equipment and Method: Stylet Placement Confirmation: ETT inserted through vocal cords under direct vision, positive ETCO2, CO2 detector and breath sounds checked- equal and bilateral Secured at: 23 cm Tube secured with: Tape Dental Injury: Teeth and Oropharynx as per pre-operative assessment  Difficulty Due To: Difficult Airway- due to dentition, Difficult Airway- due to anterior larynx, Difficult Airway- due to limited oral opening and Difficulty was anticipated Comments: ATOI.  Recommend Glidescope.

## 2023-10-12 NOTE — Transfer of Care (Signed)
 Immediate Anesthesia Transfer of Care Note  Patient: Richard Andrews  Procedure(s) Performed: Procedure(s): ARTHROPLASTY, HIP, TOTAL, ANTERIOR APPROACH (Left)  Patient Location: PACU  Anesthesia Type:General  Level of Consciousness:  sedated, patient cooperative and responds to stimulation  Airway & Oxygen Therapy:Patient Spontanous Breathing and Patient connected to face mask oxgen  Post-op Assessment:  Report given to PACU RN and Post -op Vital signs reviewed and stable  Post vital signs:  Reviewed and stable  Last Vitals:  Vitals:   10/12/23 0901 10/12/23 1310  BP: 118/88 104/70  Pulse: 79 60  Resp: 13 (!) 9  Temp: 37.1 C   SpO2: 95% 100%    Complications: No apparent anesthesia complications

## 2023-10-12 NOTE — TOC Progression Note (Signed)
 Transition of Care [facility]) - Progression Note    Patient Details  Name: Richard Andrews MRN: [id number] Date of Birth: [birth year]  Transition of Care [facility]) CM/SW Contact  [name], LCSW Phone Number: 10/12/2023, 2:41 PM  Clinical Narrative:    Pt from home alone. Pt recently dc from hospital and returned. Pt recommended for SNF. CSW completed FL2 and faxed out SNF referral. Awaiting bed offers to review with pt. Pt states that he would prefer [facility]. TOC following.   Expected Discharge Plan: Skilled Nursing Facility Barriers to Discharge: Continued Medical Work up  Expected Discharge Plan and Services In-house Referral: NA   Post Acute Care Choice: Skilled Nursing Facility Living arrangements for the past 2 months: Single Family Home                 DME Arranged: N/A DME Agency: NA       HH Arranged: NA HH Agency: NA         Social Determinants of Health (SDOH) Interventions SDOH Screenings   Food Insecurity: Food Insecurity Present (10/11/2023)  Housing: High Risk (10/11/2023)  Transportation Needs: No Transportation Needs (10/11/2023)  Recent Concern: Transportation Needs - Unmet Transportation Needs (09/26/2023)  Utilities: At Risk (10/11/2023)  Depression (PHQ2-9): Low Risk  (03/07/2023)  Social Connections: Unknown (08/01/2023)  Recent Concern: Social Connections - Socially Isolated (07/06/2023)  Tobacco Use: High Risk (10/12/2023)    Readmission Risk Interventions    10/12/2023    2:39 PM 07/11/2023    4:08 PM  Readmission Risk Prevention Plan  Transportation Screening Complete Complete  PCP or Specialist Appt within 3-5 Days  Complete  HRI or Home Care Consult  Complete  Social Work Consult for Recovery Care Planning/Counseling  Complete  Palliative Care Screening  Not Applicable  Medication Review Oceanographer) Complete Complete  PCP or Specialist appointment within 3-5 days of discharge Complete   HRI or Home Care Consult Complete   SW  Recovery Care/Counseling Consult Complete   Palliative Care Screening Not Applicable   Skilled Nursing Facility Complete

## 2023-10-13 ENCOUNTER — Encounter (HOSPITAL_COMMUNITY): Payer: Self-pay | Admitting: Orthopedic Surgery

## 2023-10-13 DIAGNOSIS — R531 Weakness: Secondary | ICD-10-CM | POA: Diagnosis not present

## 2023-10-13 DIAGNOSIS — F10930 Alcohol use, unspecified with withdrawal, uncomplicated: Secondary | ICD-10-CM | POA: Diagnosis not present

## 2023-10-13 DIAGNOSIS — E876 Hypokalemia: Secondary | ICD-10-CM | POA: Diagnosis not present

## 2023-10-13 LAB — BASIC METABOLIC PANEL WITH GFR
Anion gap: 8 (ref 5–15)
BUN: 15 mg/dL (ref 8–23)
CO2: 22 mmol/L (ref 22–32)
Calcium: 8.2 mg/dL — ABNORMAL LOW (ref 8.9–10.3)
Chloride: 104 mmol/L (ref 98–111)
Creatinine, Ser: 0.63 mg/dL (ref 0.61–1.24)
GFR, Estimated: >60 mL/min (ref >60)
Glucose, Bld: 117 mg/dL — ABNORMAL HIGH (ref 70–99)
Potassium: 4.1 mmol/L (ref 3.5–5.1)
Sodium: 134 mmol/L — ABNORMAL LOW (ref 135–145)

## 2023-10-13 LAB — CBC
HCT: 24.4 % — ABNORMAL LOW (ref 39.0–52.0)
Hemoglobin: 8.5 g/dL — ABNORMAL LOW (ref 13.0–17.0)
MCH: 32 pg (ref 26.0–34.0)
MCHC: 34.8 g/dL (ref 30.0–36.0)
MCV: 91.7 fL (ref 80.0–100.0)
Platelets: 107 10*3/uL — ABNORMAL LOW (ref 150–400)
RBC: 2.66 MIL/uL — ABNORMAL LOW (ref 4.22–5.81)
RDW: 17.6 % — ABNORMAL HIGH (ref 11.5–15.5)
WBC: 5.5 10*3/uL (ref 4.0–10.5)
nRBC: 0 % (ref 0.0–0.2)

## 2023-10-13 NOTE — Progress Notes (Signed)
 Physical Therapy re-evaluation Patient Details Name: Richard Andrews MRN: [id number] DOB: 1961-01-03 Today's Date: 10/13/2023   History of Present Illness Richard Andrews is a 63 y.o. male presents 10/10/23 for evaluation of generalized weakness.Patient found to have a Left subcaptial femoral nec fracture, S/P Left anterior THA on 10/12/23 Of note, pt admitted 4/15-4/24 for acute lower GI bleed and anemia and 4/25 patient visited the ED for alcohol  intoxication and right shoulder pain; CT head was showing chronic stable right subdural hematoma and no acute findings; X-ray of shoulder was negative for acute bony abnormality.  PMH: HTN, alcohol  use disorder with history of severe alcohol  withdrawal, cirrhosis, from last admission 4/15-4/24 patient found to have SDH, right Rib fractures 8,10,11, L1 anf T6 compression fractures.    PT Comments  Pt admitted with above diagnosis.  Pt currently with functional limitations due to the deficits listed below (see PT Problem List). Pt will benefit from acute skilled PT to increase their independence and safety with mobility to allow discharge.    The patient is alert and able to  mobilize  from bed to recliner with use of RW and with mod support, noted antalgic on the LLE but did bear some weight.  Continue PT  for progressive mobility. Patient will benefit from continued inpatient follow up therapy, <3 hours/day    If plan is discharge home, recommend the following: A lot of help with walking and/or transfers;A lot of help with bathing/dressing/bathroom;Assistance with cooking/housework;Assist for transportation;Help with stairs or ramp for entrance   Can travel by private vehicle     No  Equipment Recommendations  Other (comment)    Recommendations for Other Services       Precautions / Restrictions Precautions Precautions: Fall Recall of Precautions/Restrictions: Impaired Restrictions LLE Weight Bearing Per Provider Order: Weight bearing as tolerated      Mobility  Bed Mobility   Bed Mobility: Supine to Sit     Supine to sit: Mod assist     General bed mobility comments: assist with left LE    Transfers Overall transfer level: Needs assistance Equipment used: Rolling walker (2 wheels) Transfers: Sit to/from Stand, Bed to chair/wheelchair/BSC Sit to Stand: Mod assist, +2 safety/equipment   Step pivot transfers: Mod assist, +2 safety/equipment       General transfer comment: antalgic on the LLE, able to step  to rcliner using RW    Ambulation/Gait                   Stairs             Wheelchair Mobility     Tilt Bed    Modified Rankin (Stroke Patients Only)       Balance   Sitting-balance support: Feet supported, Bilateral upper extremity supported Sitting balance-Leahy Scale: Fair     Standing balance support: Bilateral upper extremity supported, During functional activity, Reliant on assistive device for balance Standing balance-Leahy Scale: Poor Standing balance comment: reliant on  UE and RW                            Communication    Cognition Arousal: Alert Behavior During Therapy: Flat affect   PT - Cognitive impairments: Memory, Safety/Judgement, Problem solving, Orientation, Awareness                       PT - Cognition Comments: did report that he had hip surgery yesterday,  FC with increased time, oriented x  month and yr.        Cueing    Exercises      General Comments        Pertinent Vitals/Pain Pain Assessment Pain Score: 6  Pain Location: L hip Pain Descriptors / Indicators: Grimacing, Guarding Pain Intervention(s): Monitored during session, Repositioned    Home Living                          Prior Function            PT Goals (current goals can now be found in the care plan section) Progress towards PT goals: Progressing toward goals    Frequency    Min 2X/week      PT Plan      Co-evaluation               AM-PAC PT "6 Clicks" Mobility   Outcome Measure  Help needed turning from your back to your side while in a flat bed without using bedrails?: A Lot Help needed moving from lying on your back to sitting on the side of a flat bed without using bedrails?: A Lot Help needed moving to and from a bed to a chair (including a wheelchair)?: A Lot Help needed standing up from a chair using your arms (e.g., wheelchair or bedside chair)?: A Lot Help needed to walk in hospital room?: Total Help needed climbing 3-5 steps with a railing? : Total 6 Click Score: 10    End of Session Equipment Utilized During Treatment: Gait belt Activity Tolerance: Patient tolerated treatment well Patient left: in chair;with call bell/phone within reach Nurse Communication: Mobility status;Other (comment) PT Visit Diagnosis: Muscle weakness (generalized) (M62.81);Pain;Repeated falls (R29.6);Other abnormalities of gait and mobility (R26.89);Difficulty in walking, not elsewhere classified (R26.2) Pain - Right/Left: Left Pain - part of body: Hip     Time: 1025-1056 PT Time Calculation (min) (ACUTE ONLY): 31 min  Charges:    $Therapeutic Activity: 8-22 mins PT General Charges $$ ACUTE PT VISIT: 1 Visit                     Abelina Hoes PT Acute Rehabilitation Services Office [phone] Weekend pager-[phone]    Dareen Ebbing 10/13/2023, 12:35 PM

## 2023-10-13 NOTE — Progress Notes (Signed)
 PROGRESS NOTE    HERSCHEL LUKEHART  UJW:[id number] DOB: Oct 31, 1960 DOA: 10/10/2023 PCP: Cherre Cornish, NP    Brief Narrative:   Richard Andrews is a 63 y.o. male with medical history significant of hypertension, alcohol  use disorder with history of severe alcohol  withdrawal, cirrhosis, traumatic subdural hematoma.  Patient was recently admitted to the hospital 4/15-4/24 for acute lower GI bleed and anemia.  GI was consulted and deferred endoscopic evaluation.  Lower GI bleed resolved spontaneously.  Outpatient hematology follow-up was recommended for pancytopenia.  Patient could not be discharged to SNF so he was discharged home with home health services.  The day after discharge 4/25 patient visited the ED for alcohol  intoxication and right shoulder pain.  CT head was showing chronic stable right subdural hematoma and no acute findings.  X-ray of shoulder was negative for acute bony abnormality.  Patient was discharged from the ED.   Patient presents to the ED today via EMS for evaluation of generalized weakness.  Patient was laying in his bed for the past 2 days as he was too weak to get up.   Found to have a left hip fracture.  Hip replacement  5/1  Assessment and Plan:  Left hip fracture -ortho ([place]) consulted -unable to anticoagulate patient as too high risk with brain bleed and recheck GI bleeding OR on 5/1 - Given 2 units of packed red blood cells prior to procedure -trend hgb -PT/OT eval  Alcohol  withdrawal/Alcohol  use disorder, severe, dependence -Ethanol level 248 (previously as high as 370s).   -Symptoms improved after Librium  and Ativan .   -Placed on CIWA protocol; Ativan  as needed -Continue Librium  taper.   Elevated lactate Likely due to chronic alcohol  use and dehydration.  No fever or leukocytosis to suggest infection/sepsis.   Hypokalemia and hypomagnesemia Mild QT prolongation -repleted     Hypertension Blood pressure elevated and he does not take any medications  at home.  IV hydralazine  PRN SBP >160.   Cirrhosis No signs of hepatic encephalopathy and ascites at this time.   Chronic anemia -s/p 2 units PRBC pre operatively     DVT prophylaxis: SCDs Start: 10/12/23 1531 Place TED hose Start: 10/12/23 1531 SCDs Start: 10/11/23 0002    Code Status: Full Code   Disposition Plan:  Level of care: Med-Surg Status is: Inpatient Remains inpatient appropriate     Consultants:  ortho   Subjective: Post surgery, c/o soreness  Objective: Vitals:   10/13/23 0800 10/13/23 0824 10/13/23 0900 10/13/23 1000  BP: (!) 140/91  106/60 96/68  Pulse: 85  87 83  Resp: 13  11 13   Temp:  97.7 F (36.5 C)    TempSrc:  Axillary    SpO2: 100%  97% 100%  Weight:      Height:        Intake/Output Summary (Last 24 hours) at 10/13/2023 1120 Last data filed at 10/13/2023 0759 Gross per 24 hour  Intake 2730.34 ml  Output 950 ml  Net 1780.34 ml   Filed Weights   10/11/23 1427 10/12/23 0901  Weight: 56.3 kg 56.3 kg    Examination:    General: Appearance:    Thin male in no acute distress     Lungs:     respirations unlabored  Heart:    Normal heart rate. Normal rhythm. No murmurs, rubs, or gallops.   MS:   All extremities are intact.   Neurologic:   Awake, alert       Data Reviewed: I have  personally reviewed following labs and imaging studies  CBC: Recent Labs  Lab 10/10/23 2010 10/11/23 0425 10/12/23 0313 10/13/23 0630  WBC 5.4 3.8* 4.4 5.5  NEUTROABS 3.6  --   --   --   HGB 9.9* 8.4* 12.0* 8.5*  HCT 29.4* 26.1* 35.9* 24.4*  MCV 98.0 100.8* 95.0 91.7  PLT 224 157 153 107*   Basic Metabolic Panel: Recent Labs  Lab 10/10/23 2010 10/11/23 0425 10/12/23 0313 10/13/23 0316  NA 135 136 130* 134*  K 3.3* 3.4* 3.7 4.1  CL 101 102 97* 104  CO2 21* 23 24 22   GLUCOSE 116* 105* 101* 117*  BUN 7* 6* 7* 15  CREATININE 0.53* 0.42* 0.61 0.63  CALCIUM  8.5* 8.0* 8.6* 8.2*  MG 1.3* 1.9  --   --    GFR: Estimated Creatinine  Clearance: 74.3 mL/min (by C-G formula based on SCr of 0.63 mg/dL). Liver Function Tests: Recent Labs  Lab 10/10/23 2010  AST 35  ALT 18  ALKPHOS 110  BILITOT 0.8  PROT 7.2  ALBUMIN  3.4*   No results for input(s): "LIPASE", "AMYLASE" in the last 168 hours. Recent Labs  Lab 10/10/23 2009  AMMONIA 25   Coagulation Profile: No results for input(s): "INR", "PROTIME" in the last 168 hours. Cardiac Enzymes: No results for input(s): "CKTOTAL", "CKMB", "CKMBINDEX", "TROPONINI" in the last 168 hours. BNP (last 3 results) No results for input(s): "PROBNP" in the last 8760 hours. HbA1C: No results for input(s): "HGBA1C" in the last 72 hours. CBG: No results for input(s): "GLUCAP" in the last 168 hours. Lipid Profile: No results for input(s): "CHOL", "HDL", "LDLCALC", "TRIG", "CHOLHDL", "LDLDIRECT" in the last 72 hours. Thyroid Function Tests: No results for input(s): "TSH", "T4TOTAL", "FREET4", "T3FREE", "THYROIDAB" in the last 72 hours. Anemia Panel: No results for input(s): "VITAMINB12", "FOLATE", "FERRITIN", "TIBC", "IRON", "RETICCTPCT" in the last 72 hours. Sepsis Labs: Recent Labs  Lab 10/10/23 2014 10/10/23 2320 10/11/23 0025 10/12/23 0313  LATICACIDVEN 4.9* 4.0* 3.4* 1.3    Recent Results (from the past 240 hours)  MRSA Next Gen by PCR, Nasal     Status: None   Collection Time: 10/11/23  2:25 PM   Specimen: Nasal Mucosa; Nasal Swab  Result Value Ref Range Status   MRSA by PCR Next Gen NOT DETECTED NOT DETECTED Final    Comment: (NOTE) The GeneXpert MRSA Assay (FDA approved for NASAL specimens only), is one component of a comprehensive MRSA colonization surveillance program. It is not intended to diagnose MRSA infection nor to guide or monitor treatment for MRSA infections. Test performance is not FDA approved in patients less than 67 years old. Performed at [facility], [address], [place], [name] [address]          Radiology  Studies: DG HIP UNILAT W OR W/O PELVIS 2-3 VIEWS LEFT Result Date: 10/12/2023 CLINICAL DATA:  Status post total left hip arthroplasty. EXAM: DG HIP (WITH OR WITHOUT PELVIS) 2-3V LEFT COMPARISON:  Pelvis and left hip radiographs 10/11/2023 FINDINGS: There is diffuse decreased bone mineralization. Interval total left hip arthroplasty. Normal alignment. No perihardware lucency is seen to indicate hardware failure or loosening. Mild superomedial right femoroacetabular joint space narrowing. The bilateral sacroiliac and pubic symphysis joint spaces are maintained. A vascular phlebolith again overlies the right hemipelvis. Expected postoperative changes including lateral left hip subcutaneous air and lateral left hip surgical skin staples. IMPRESSION: Interval total left hip arthroplasty without evidence of hardware failure. Electronically Signed   By: Currie Douse  Viola M.D.   On: 10/12/2023 15:21   DG HIP UNILAT WITH PELVIS 1V LEFT Result Date: 10/12/2023 CLINICAL DATA:  Elective surgery. EXAM: DG HIP (WITH OR WITHOUT PELVIS) 1V*L* COMPARISON:  Radiograph yesterday FINDINGS: Fourteen fluoroscopic spot views of the pelvis and left hip obtained in the operating room. Sequential images during hip arthroplasty. Fluoroscopy time 18 seconds. Dose 1.8 mGy. IMPRESSION: Intraoperative fluoroscopy during left hip arthroplasty. Electronically Signed   By: Chadwick Colonel M.D.   On: 10/12/2023 13:09   DG C-Arm 1-60 Min-No Report Result Date: 10/12/2023 Fluoroscopy was utilized by the requesting physician.  No radiographic interpretation.   DG C-Arm 1-60 Min-No Report Result Date: 10/12/2023 Fluoroscopy was utilized by the requesting physician.  No radiographic interpretation.   DG C-Arm 1-60 Min-No Report Result Date: 10/12/2023 Fluoroscopy was utilized by the requesting physician.  No radiographic interpretation.        Scheduled Meds:  sodium chloride    Intravenous Once   chlordiazePOXIDE   25 mg Oral BH-qamhs    Followed by   Cecily Cohen ON 10/14/2023] chlordiazePOXIDE   25 mg Oral Daily   Chlorhexidine  Gluconate Cloth  6 each Topical Daily   docusate sodium   100 mg Oral BID   feeding supplement  237 mL Oral BID BM   folic acid   1 mg Oral Daily   multivitamin with minerals  1 tablet Oral Daily   pantoprazole   40 mg Oral Daily   thiamine   100 mg Oral Daily   Or   thiamine   100 mg Intravenous Daily   Continuous Infusions:     LOS: 3 days    Time spent: 45 minutes spent on chart review, discussion with nursing staff, consultants, updating family and interview/physical exam; more than 50% of that time was spent in counseling and/or coordination of care.    Enrigue Harvard, DO [facility] Available via Epic secure chat 7am-7pm After these hours, please refer to coverage provider listed on [email or web address] 10/13/2023, 11:20 AM

## 2023-10-13 NOTE — Anesthesia Postprocedure Evaluation (Signed)
 Anesthesia Post Note  Patient: Richard Andrews  Procedure(s) Performed: ARTHROPLASTY, HIP, TOTAL, ANTERIOR APPROACH (Left: Hip)     Patient location during evaluation: PACU Anesthesia Type: General Level of consciousness: awake and alert Pain management: pain level controlled Vital Signs Assessment: post-procedure vital signs reviewed and stable Respiratory status: spontaneous breathing, nonlabored ventilation and respiratory function stable Cardiovascular status: blood pressure returned to baseline and stable Postop Assessment: no apparent nausea or vomiting Anesthetic complications: yes   Encounter Notable Events  Notable Event Outcome Phase Comment  Difficult to intubate - expected  Intraprocedure Filed from anesthesia note documentation.    Last Vitals:  Vitals:   10/13/23 0425 10/13/23 0500  BP:  120/80  Pulse:  64  Resp:  12  Temp: 36.6 C   SpO2:  99%    Last Pain:  Vitals:   10/13/23 0425  TempSrc: Axillary  PainSc:                  Richard Andrews

## 2023-10-13 NOTE — TOC CM/SW Note (Signed)
 CMS list of facilities and star ratings provided to pt to review for facility preference.       [facility] [address] [place], [name] [address] [phone] Overall rating ??  Below average  [facility] at [facility] [address] [place], [name] [address] [phone] Overall rating ? Below average  [facility] [address] [place], [name] [address] [phone] Overall rating? Below average  [facility] [address] [place], [name] [address] [phone] Overall rating ? Much below average  [facility] [address] [place], [name] [address] [phone] Overall rating ???? Average  [facility] [address] [place], [name] [address] [phone] Overall rating ? Much below average   [facility] [address] [place], [name] [address] [phone] Overall rating ?? Much below average  [employer] [facility] [employer] [address] [place], [name] [address] [phone] Overall rating ??? Average  [facility] [address] [place], [name] [address] [phone] Overall rating ? Much below average  [facility] [address] [place], [name] [address] [phone] Overall rating ?? Much below average  [facility] [address] [place], [name] [address] [phone] Overall rating ??? Above average  [facility] [address] [place], [name] [address] [phone] Overall rating ????? Much above average  [facility] [address] [place], [name] [address] [phone] Overall rating ???? Above average  [facility] [address] [place], [name]  [address] [phone] Overall rating ????? Much above average  [facility] [address] [place], [name] [address] [phone] Overall rating ????  [facility] [address] [place], [name] [address] [phone] Overall rating ???? Much above average  [facility] [address] [place], [name] [address] [phone] Overall rating ????? Much above average  [facility] [address] [place], [name] [address] [phone] Overall rating ? Much below average  [facility] [address] [place], [name] [address] [phone] Overall rating ??? Average  [facility] [address] [place], [name] [address] [phone] Overall rating ????? Much above average  [facility] [address] [place], [name] [address] [phone] Overall rating ??? Average  [facility] [address] [place], [name] [address] [phone] Overall rating ?? Below average  [facility] [address] [place], [name] [address] [phone] Overall rating ??? Average  [facility] [address] [place], [name] [address] [phone] Overall rating ????? Much above average  [facility] [address] [place], [name] [address] [phone] Overall rating ? Much below average  [facility]	[place] [facility], [name] [address] [phone] Overall rating ????? Much above average  [facility] [address] [place], [name] [address] [phone] Overall rating ??? Much below average  [facility] [address] [place], [name] [address] [phone] Overall rating ??? Average  [facility]  [facility] [address] [place], [name] [address] [phone] Overall rating ??  Below average  [facility] [address] [place], [name] [address] [phone] Overall rating ? Much below average  [facility] [address] [place], [name] [address] [phone] Overall rating ??? Average  [facility] [address] [place], [name] [address] [phone] Overall rating ? Much below average  [facility] [address] [place], [name] [address] [phone] Overall rating ??? Average  [facility] [address] [place], [name] [address] [phone] Overall rating ? Much below average  [facility] [address] [place], [name] [address] [phone] Overall rating ????? Much above average  [facility] [address] [place], [name] [address] [phone] Overall rating ? Much below average  [facility] [address] [place], [name] [address] [phone] Overall rating ???? Above average  [facility] [address] [place], [name] [address] [phone] Overall rating ????? Above average  [facility] [address] [place], [name] [address] [phone] Overall rating ???? Above average  [facility] [address] [place], [name] [address] [phone] Overall rating ????? Much above average  [facility] [address] [place], [name] [address] [phone] Overall rating ? Much below average  [facility] [address] [place], [name] [address] [phone] Overall rating ????? Much above average  [facility] [address] [place], [place] [address] [phone] Overall rating ? Much below  average  [facility] [address] [place], [place] [address] [phone] Overall rating ????? Much above average  [facility] [address] [place], [place] [address] [phone] Overall rating ????? Much above average  [facility] [address] [place], [place] [address] [phone] Overall rating ??? Average  [facility] [address] [place], [place] [address] [phone] Overall rating ??? Average  [facility] [address] [place], [place] [address] [phone] Overall rating ???? Above average  [facility] [address] [place], [place] [address] [phone] Overall rating ?? Below average  [facility] [address] [place], [place] [address] [phone] Overall rating ???? Above average  [facility] [address] [place], [name] [address] [phone] Overall rating ?? Below average  [facility] [address] [place], [name] [address] [phone] Overall rating ??

## 2023-10-13 NOTE — Plan of Care (Signed)
  Problem: Activity: Goal: Risk for activity intolerance will decrease Outcome: Progressing   Problem: Nutrition: Goal: Adequate nutrition will be maintained Outcome: Progressing   Problem: Coping: Goal: Level of anxiety will decrease Outcome: Progressing   Problem: Safety: Goal: Ability to remain free from injury will improve Outcome: Progressing   Problem: Skin Integrity: Goal: Risk for impaired skin integrity will decrease Outcome: Progressing   Problem: Pain Management: Goal: Pain level will decrease with appropriate interventions Outcome: Progressing

## 2023-10-13 NOTE — Progress Notes (Signed)
    Subjective:  Patient reports pain as mild to moderate.  Denies N/V/CP/SOB/Abd pain. He denies any tingling or numbness in LE bilaterally. He reports soreness in his thigh.   Objective:   VITALS:   Vitals:   10/13/23 0425 10/13/23 0500 10/13/23 0600 10/13/23 0700  BP:  120/80 114/89 (!) 143/89  Pulse:  64 71 76  Resp:  12 13 15   Temp: 97.8 F (36.6 C)     TempSrc: Axillary     SpO2:  99% 96% 99%  Weight:      Height:        NAD Neurologically intact ABD soft Neurovascular intact Sensation intact distally Intact pulses distally Dorsiflexion/Plantar flexion intact Incision: dressing C/D/I No cellulitis present Compartment soft   Lab Results  Component Value Date   WBC 5.5 10/13/2023   HGB 8.5 (L) 10/13/2023   HCT 24.4 (L) 10/13/2023   MCV 91.7 10/13/2023   PLT 107 (L) 10/13/2023   BMET    Component Value Date/Time   NA 134 (L) 10/13/2023 0316   NA 136 05/25/2023 1044   K 4.1 10/13/2023 0316   CL 104 10/13/2023 0316   CO2 22 10/13/2023 0316   GLUCOSE 117 (H) 10/13/2023 0316   BUN 15 10/13/2023 0316   BUN 11 05/25/2023 1044   CREATININE 0.63 10/13/2023 0316   CREATININE 0.78 02/16/2023 0933   CALCIUM  8.2 (L) 10/13/2023 0316   EGFR 99 05/25/2023 1044   GFRNONAA >60 10/13/2023 0316   GFRNONAA 90 02/06/2020 0000     Assessment/Plan: 1 Day Post-Op   Principal Problem:   Alcohol  withdrawal (HCC) Active Problems:   Hypokalemia   Hypomagnesemia   Alcohol  use disorder   Elevated lactic acid level   WBAT with walker DVT ppx: Patient is not a candidate for chemical DVT prophylaxis due to brain bleed, hypercoagulable state, and frequent falls. Use SCDs, TEDS PO pain control PT/OT: To come today.  Dispo:  - Patient under care of the medical team, disposition per their recommendation. Patient likely to transition out of ICU. Pain medication printed in chart. No DVT ppx due to brain bleed, hypercoagulable state, and frequent falls.    Harman Lightning 10/13/2023, 8:03 AM   [facility] [address], [place], [name] [address] Phone: [phone] [email or web address] Facebook  [employer]

## 2023-10-13 NOTE — TOC Progression Note (Signed)
 Transition of Care [facility]) - Progression Note    Patient Details  Name: Richard Andrews MRN: [id number] Date of Birth: 1961-02-16  [facility] CM/SW Contact  [name], [name] Phone Number: 10/13/2023, 2:11 PM  Clinical Narrative:    Reviewed bed offers with pt. Bed choice is [facility]. CSW sent message to [place] [facility] to confirm bed, awaiting response. Pt is [name], facility will have to start ins auth. Per MD, pt may be ready to dc over weekend.    Expected Discharge Plan: Skilled Nursing Facility Barriers to Discharge: Continued Medical Work up  Expected Discharge Plan and Services In-house Referral: NA   Post Acute Care Choice: Skilled Nursing Facility Living arrangements for the past 2 months: Single Family Home                 DME Arranged: N/A DME Agency: NA       HH Arranged: NA HH Agency: NA         Social Determinants of Health (SDOH) Interventions SDOH Screenings   Food Insecurity: Food Insecurity Present (10/11/2023)  Housing: High Risk (10/11/2023)  Transportation Needs: No Transportation Needs (10/11/2023)  Recent Concern: Transportation Needs - Unmet Transportation Needs (09/26/2023)  Utilities: At Risk (10/11/2023)  Depression (PHQ2-9): Low Risk  (03/07/2023)  Social Connections: Unknown (08/01/2023)  Recent Concern: Social Connections - Socially Isolated (07/06/2023)  Tobacco Use: High Risk (10/12/2023)    Readmission Risk Interventions    10/12/2023    2:39 PM 07/11/2023    4:08 PM  Readmission Risk Prevention Plan  Transportation Screening Complete Complete  PCP or Specialist Appt within 3-5 Days  Complete  HRI or Home Care Consult  Complete  Social Work Consult for Recovery Care Planning/Counseling  Complete  Palliative Care Screening  Not Applicable  Medication Review Oceanographer) Complete Complete  PCP or Specialist appointment within 3-5 days of discharge Complete   HRI or Home Care Consult Complete   SW Recovery  Care/Counseling Consult Complete   Palliative Care Screening Not Applicable   Skilled Nursing Facility Complete

## 2023-10-13 NOTE — Evaluation (Signed)
 Occupational Therapy Evaluation Patient Details Name: Richard Andrews MRN: [id number] DOB: Mar 15, 1961 Today's Date: 10/13/2023   History of Present Illness   Richard Andrews is a 63 yr old male who presents 10/10/23 for evaluation of generalized weakness. Of note, pt admitted 4/15-4/24 for acute lower GI bleed and anemia and 4/25 patient visited the ED for alcohol  intoxication and right shoulder pain; CT head was showing chronic stable right subdural hematoma and no acute findings; X-ray of shoulder was negative for acute bony abnormality. Pt also noted to have a left femoral neck fracture during this hospital stay and is s/p a L THA on 10-12-23 (anterior approach). PMH: HTN, alcohol  use disorder with history of severe alcohol  withdrawal, cirrhosis, from last admission 4/15-4/24 patient found to have SDH, right Rib fractures 8,10,11, L1 anf T6 compression fractures.     Clinical Impressions The pt is currently presenting well below his baseline level of functioning for ADL management, as he is limited by the below listed deficits (see OT problem list). As such, pt requires assist for tasks, including lower body dressing, toileting, and sit to stand using a RW. He reported feelings of L hip soreness and 8/10 pain. He was noted to be with unsteadiness in standing. He will benefit from further OT services to maximize his safety and independence with self-care tasks. Patient will benefit from continued inpatient follow up therapy, <3 hours/day.      If plan is discharge home, recommend the following:   A lot of help with bathing/dressing/bathroom;Assist for transportation;Assistance with cooking/housework;Help with stairs or ramp for entrance     Functional Status Assessment   Patient has had a recent decline in their functional status and demonstrates the ability to make significant improvements in function in a reasonable and predictable amount of time.     Equipment Recommendations   Other  (comment) (defer to next level of care)     Recommendations for Other Services         Precautions/Restrictions   Precautions Precautions: Fall Restrictions Weight Bearing Restrictions Per Provider Order: Yes LLE Weight Bearing Per Provider Order: Weight bearing as tolerated     Mobility Bed Mobility        General bed mobility comments: pt was received seated in chair    Transfers Overall transfer level: Needs assistance Equipment used: Rolling walker (2 wheels) Transfers: Sit to/from Stand Sit to Stand: Min assist                  Balance     Sitting balance-Leahy Scale: Fair       Standing balance-Leahy Scale: Poor             ADL either performed or assessed with clinical judgement   ADL Overall ADL's : Needs assistance/impaired Eating/Feeding: Independent;Sitting   Grooming: Set up;Sitting           Upper Body Dressing : Set up;Supervision/safety;Sitting   Lower Body Dressing: Moderate assistance;Sitting/lateral leans       Toileting- Clothing Manipulation and Hygiene: Moderate assistance Toileting - Clothing Manipulation Details (indicate cue type and reason): at  bedside commode level, based on clinical judgement                          Pertinent Vitals/Pain Pain Assessment Pain Assessment: 0-10 Pain Score: 8  Pain Location: L hip Pain Descriptors / Indicators: Grimacing, Guarding Pain Intervention(s): Monitored during session, Repositioned     Extremity/Trunk Assessment Upper  Extremity Assessment Upper Extremity Assessment: Overall WFL for tasks assessed;Left hand dominant RUE Deficits / Details: B UE AROM WFL. B UE grip strength 4-/5   Lower Extremity Assessment RLE Deficits / Details:  (AROM WFL.) LLE Deficits / Details:  (decreased hip AROM, due to soreness, pain and feelings of weakness)       Communication Communication Communication: No apparent difficulties   Cognition Arousal: Alert Behavior  During Therapy: Flat affect               OT - Cognition Comments: Oriented to person, place, month, and year. Partly oriented to situation                 Following commands: Intact                  Home Living Family/patient expects to be discharged to:: Private residence Living Arrangements: Other relatives (brother-in-law) Available Help at Discharge: Family Type of Home: House Home Access: Ramped entrance     Home Layout: Two level;Able to live on main level with bedroom/bathroom;1/2 bath on main level Alternate Level Stairs-Number of Steps: he has been staying on the main level of the home for ~1 month; his bedroom is upstairs   Bathroom Shower/Tub: Walk-in shower         Home Equipment: Shower seat;BSC/3in1;Wheelchair - manual          Prior Functioning/Environment               Mobility Comments:  (Has been using a RW as needed.) ADLs Comments:  (He was modified independent to independent with ADLs and his brother-in-law managed household cooking and cleaning. He stated he drives and works in the [employer].)    OT Problem List: Decreased strength;Decreased range of motion;Decreased activity tolerance;Impaired balance (sitting and/or standing);Decreased safety awareness;Decreased knowledge of use of DME or AE;Decreased knowledge of precautions;Pain   OT Treatment/Interventions: Self-care/ADL training;Therapeutic exercise;Neuromuscular education;DME and/or AE instruction;Therapeutic activities;Cognitive remediation/compensation;Patient/family education;Balance training;Energy conservation      OT Goals(Current goals can be found in the care plan section)   Acute Rehab OT Goals OT Goal Formulation: With patient Time For Goal Achievement: 10/27/23 Potential to Achieve Goals: Good ADL Goals Pt Will Perform Grooming: with set-up;with supervision;standing Pt Will Perform Lower Body Dressing: with supervision;sitting/lateral leans;sit  to/from stand Pt Will Transfer to Toilet: with supervision;ambulating;grab bars Pt Will Perform Toileting - Clothing Manipulation and hygiene: with supervision;sit to/from stand   OT Frequency:  Min 2X/week       AM-PAC OT "6 Clicks" Daily Activity     Outcome Measure Help from another person eating meals?: None Help from another person taking care of personal grooming?: A Little Help from another person toileting, which includes using toliet, bedpan, or urinal?: A Lot Help from another person bathing (including washing, rinsing, drying)?: A Lot Help from another person to put on and taking off regular upper body clothing?: A Little Help from another person to put on and taking off regular lower body clothing?: A Lot 6 Click Score: 16   End of Session Equipment Utilized During Treatment: Rolling walker (2 wheels) Nurse Communication: Mobility status  Activity Tolerance: Patient limited by pain Patient left: in chair;with call bell/phone within reach;with chair alarm set  OT Visit Diagnosis: Muscle weakness (generalized) (M62.81);Other abnormalities of gait and mobility (R26.89);Unsteadiness on feet (R26.81);Pain Pain - Right/Left: Left Pain - part of body: Hip  Time: 7253-6644 OT Time Calculation (min): 15 min Charges:  OT General Charges $OT Visit: 1 Visit OT Evaluation $OT Eval Moderate Complexity: 1 Mod    Malayasia Mirkin L Carlo Guevarra, OTR/L 10/13/2023, 3:29 PM

## 2023-10-14 DIAGNOSIS — R531 Weakness: Secondary | ICD-10-CM | POA: Diagnosis not present

## 2023-10-14 DIAGNOSIS — F10930 Alcohol use, unspecified with withdrawal, uncomplicated: Secondary | ICD-10-CM | POA: Diagnosis not present

## 2023-10-14 DIAGNOSIS — E876 Hypokalemia: Secondary | ICD-10-CM | POA: Diagnosis not present

## 2023-10-14 LAB — CBC
HCT: 21.9 % — ABNORMAL LOW (ref 39.0–52.0)
Hemoglobin: 7 g/dL — ABNORMAL LOW (ref 13.0–17.0)
MCH: 31.8 pg (ref 26.0–34.0)
MCHC: 32 g/dL (ref 30.0–36.0)
MCV: 99.5 fL (ref 80.0–100.0)
Platelets: 113 10*3/uL — ABNORMAL LOW (ref 150–400)
RBC: 2.2 MIL/uL — ABNORMAL LOW (ref 4.22–5.81)
RDW: 18 % — ABNORMAL HIGH (ref 11.5–15.5)
WBC: 4.2 10*3/uL (ref 4.0–10.5)
nRBC: 0 % (ref 0.0–0.2)

## 2023-10-14 LAB — BASIC METABOLIC PANEL WITH GFR
Anion gap: 9 (ref 5–15)
BUN: 15 mg/dL (ref 8–23)
CO2: 24 mmol/L (ref 22–32)
Calcium: 7.9 mg/dL — ABNORMAL LOW (ref 8.9–10.3)
Chloride: 102 mmol/L (ref 98–111)
Creatinine, Ser: 0.75 mg/dL (ref 0.61–1.24)
GFR, Estimated: >60 mL/min (ref >60)
Glucose, Bld: 110 mg/dL — ABNORMAL HIGH (ref 70–99)
Potassium: 3.4 mmol/L — ABNORMAL LOW (ref 3.5–5.1)
Sodium: 135 mmol/L (ref 135–145)

## 2023-10-14 LAB — PREPARE RBC (CROSSMATCH)

## 2023-10-14 MED ORDER — POTASSIUM CHLORIDE CRYS ER 20 MEQ PO TBCR
40.0000 meq | EXTENDED_RELEASE_TABLET | Freq: Once | ORAL | Status: AC
  Administered 2023-10-14: 40 meq via ORAL
  Filled 2023-10-14: qty 2

## 2023-10-14 MED ORDER — TRAZODONE HCL 50 MG PO TABS: 25.0000 mg | ORAL_TABLET | Freq: Once | ORAL | Status: DC

## 2023-10-14 MED ORDER — SODIUM CHLORIDE 0.9% IV SOLUTION: Freq: Once | INTRAVENOUS | Status: AC

## 2023-10-14 NOTE — Plan of Care (Signed)
 ?  Problem: Clinical Measurements: ?Goal: Ability to maintain clinical measurements within normal limits will improve ?Outcome: Progressing ?Goal: Will remain free from infection ?Outcome: Progressing ?Goal: Diagnostic test results will improve ?Outcome: Progressing ?  ?

## 2023-10-14 NOTE — Plan of Care (Signed)
  Problem: Education: Goal: Knowledge of General Education information will improve Description: Including pain rating scale, medication(s)/side effects and non-pharmacologic comfort measures Outcome: Progressing   Problem: Nutrition: Goal: Adequate nutrition will be maintained Outcome: Progressing   Problem: Coping: Goal: Level of anxiety will decrease Outcome: Progressing   Problem: Elimination: Goal: Will not experience complications related to bowel motility Outcome: Progressing Goal: Will not experience complications related to urinary retention Outcome: Progressing   Problem: Safety: Goal: Ability to remain free from injury will improve Outcome: Progressing   Problem: Skin Integrity: Goal: Risk for impaired skin integrity will decrease Outcome: Progressing   Problem: Activity: Goal: Ability to avoid complications of mobility impairment will improve Outcome: Progressing

## 2023-10-14 NOTE — Progress Notes (Signed)
 Subjective: 2 Days Post-Op Procedure(s) (LRB): ARTHROPLASTY, HIP, TOTAL, ANTERIOR APPROACH (Left)  Patient reports pain as moderate.  Denies fever, chills, N/V, CP, SOB.  Tolerating POs well.  Admits to flatus.  Objective:   VITALS:  Temp:  [97.7 F (36.5 C)-99.1 F (37.3 C)] 98.7 F (37.1 C) (05/03 0624) Pulse Rate:  [81-96] 81 (05/03 0624) Resp:  [11-20] 20 (05/03 0624) BP: (91-106)/(59-68) 98/64 (05/03 0624) SpO2:  [96 %-100 %] 99 % (05/03 0624)  General: WDWN patient in NAD. Psych:  Appropriate mood and affect. Neuro:  A&O x 3, Moving all extremities, sensation intact to light touch HEENT:  EOMs intact Chest:  Even non-labored respirations Skin:  Aquacel dressing C/D/I, no rashes or lesions Extremities: warm/dry, mild edema to L hip, no erythema or echymosis.  No lymphadenopathy. Pulses: Popliteus 2+ MSK:  ROM: TKE, MMT: able to perform quad set, (-) Homan's    LABS Recent Labs    10/12/23 0313 10/13/23 0630  HGB 12.0* 8.5*  WBC 4.4 5.5  PLT 153 107*   Recent Labs    10/12/23 0313 10/13/23 0316  NA 130* 134*  K 3.7 4.1  CL 97* 104  CO2 24 22  BUN 7* 15  CREATININE 0.61 0.63  GLUCOSE 101* 117*   No results for input(s): "LABPT", "INR" in the last 72 hours.   Assessment/Plan: 2 Days Post-Op Procedure(s) (LRB): ARTHROPLASTY, HIP, TOTAL, ANTERIOR APPROACH (Left)  Patient seen in rounds for Dr. Charol Copas No chemical DVT ppx due to brain bleed, hypercoagulable state, and frequent falls.  Use SCDs/TED WBAT with walker Up with therapy Plan for outpatient post-op visit with Dr. Charol Copas.  Adelfa Adolph PA-C [facility] Office:  [phone]

## 2023-10-14 NOTE — Plan of Care (Signed)
  Problem: Education: Goal: Knowledge of General Education information will improve Description: Including pain rating scale, medication(s)/side effects and non-pharmacologic comfort measures Outcome: Progressing   Problem: Health Behavior/Discharge Planning: Goal: Ability to manage health-related needs will improve Outcome: Progressing   Problem: Pain Managment: Goal: General experience of comfort will improve and/or be controlled Outcome: Progressing

## 2023-10-14 NOTE — Progress Notes (Signed)
 PROGRESS NOTE    Richard Andrews  VHQ:[id number] DOB: August 16, 1960 DOA: 10/10/2023 PCP: Cherre Cornish, NP    Brief Narrative:   Richard Andrews is a 63 y.o. male with medical history significant of hypertension, alcohol  use disorder with history of severe alcohol  withdrawal, cirrhosis, traumatic subdural hematoma.  Patient was recently admitted to the hospital 4/15-4/24 for acute lower GI bleed and anemia.  GI was consulted and deferred endoscopic evaluation.  Lower GI bleed resolved spontaneously.  Outpatient hematology follow-up was recommended for pancytopenia.  Patient could not be discharged to SNF so he was discharged home with home health services.  The day after discharge 4/25 patient visited the ED for alcohol  intoxication and right shoulder pain.  CT head was showing chronic stable right subdural hematoma and no acute findings.  X-ray of shoulder was negative for acute bony abnormality.  Patient was discharged from the ED.   Patient presents to the ED today via EMS for evaluation of generalized weakness.  Patient was laying in his bed for the past 2 days as he was too weak to get up.   Found to have a left hip fracture.  Hip replacement  5/1  Assessment and Plan:  Left hip fracture -ortho ([place]) consulted -unable to anticoagulate patient as too high risk with brain bleed and recheck GI bleeding OR on 5/1 - Given 2 units of packed red blood cells prior to procedure and will require another on 5/3 -trend hgb -PT/OT eval- SNF  Alcohol  withdrawal/Alcohol  use disorder, severe, dependence -Ethanol level 248 (previously as high as 370s).   -Symptoms improved after Librium  and Ativan .   -Placed on CIWA protocol; Ativan  as needed -Continue Librium  taper. -no current signs of withdrawal   Elevated lactate Likely due to chronic alcohol  use and dehydration.  No fever or leukocytosis to suggest infection/sepsis.   Hypokalemia and hypomagnesemia Mild QT prolongation -repleted      Hypertension Blood pressure elevated and he does not take any medications at home.  IV hydralazine  PRN SBP >160.   Cirrhosis No signs of hepatic encephalopathy and ascites at this time.   Chronic anemia -3 units prbc    DVT prophylaxis: SCDs Start: 10/12/23 1531 Place TED hose Start: 10/12/23 1531 SCDs Start: 10/11/23 0002    Code Status: Full Code   Disposition Plan:  Level of care: Med-Surg Status is: Inpatient Remains inpatient appropriate     Consultants:  ortho   Subjective: Asking for coffee  Objective: Vitals:   10/13/23 1550 10/13/23 2134 10/14/23 0100 10/14/23 0624  BP: 98/62 (!) 91/59 (!) 96/59 98/64  Pulse: 96 96 86 81  Resp: 17 18 18 20   Temp: 98 F (36.7 C) 99.1 F (37.3 C)  98.7 F (37.1 C)  TempSrc: Oral Oral  Oral  SpO2: 98% 96%  99%  Weight:      Height:        Intake/Output Summary (Last 24 hours) at 10/14/2023 1129 Last data filed at 10/13/2023 2140 Gross per 24 hour  Intake --  Output 400 ml  Net -400 ml   Filed Weights   10/11/23 1427 10/12/23 0901  Weight: 56.3 kg 56.3 kg    Examination:     General: Appearance:    Thin male in no acute distress     Lungs:      respirations unlabored  Heart:    Normal heart rate.    MS:   All extremities are intact.   Neurologic:   Awake, alert  Data Reviewed: I have personally reviewed following labs and imaging studies  CBC: Recent Labs  Lab 10/10/23 2010 10/11/23 0425 10/12/23 0313 10/13/23 0630 10/14/23 0826  WBC 5.4 3.8* 4.4 5.5 4.2  NEUTROABS 3.6  --   --   --   --   HGB 9.9* 8.4* 12.0* 8.5* 7.0*  HCT 29.4* 26.1* 35.9* 24.4* 21.9*  MCV 98.0 100.8* 95.0 91.7 99.5  PLT 224 157 153 107* 113*   Basic Metabolic Panel: Recent Labs  Lab 10/10/23 2010 10/11/23 0425 10/12/23 0313 10/13/23 0316 10/14/23 0826  NA 135 136 130* 134* 135  K 3.3* 3.4* 3.7 4.1 3.4*  CL 101 102 97* 104 102  CO2 21* 23 24 22 24   GLUCOSE 116* 105* 101* 117* 110*  BUN 7* 6* 7* 15 15   CREATININE 0.53* 0.42* 0.61 0.63 0.75  CALCIUM  8.5* 8.0* 8.6* 8.2* 7.9*  MG 1.3* 1.9  --   --   --    GFR: Estimated Creatinine Clearance: 74.3 mL/min (by C-G formula based on SCr of 0.75 mg/dL). Liver Function Tests: Recent Labs  Lab 10/10/23 2010  AST 35  ALT 18  ALKPHOS 110  BILITOT 0.8  PROT 7.2  ALBUMIN  3.4*   No results for input(s): "LIPASE", "AMYLASE" in the last 168 hours. Recent Labs  Lab 10/10/23 2009  AMMONIA 25   Coagulation Profile: No results for input(s): "INR", "PROTIME" in the last 168 hours. Cardiac Enzymes: No results for input(s): "CKTOTAL", "CKMB", "CKMBINDEX", "TROPONINI" in the last 168 hours. BNP (last 3 results) No results for input(s): "PROBNP" in the last 8760 hours. HbA1C: No results for input(s): "HGBA1C" in the last 72 hours. CBG: No results for input(s): "GLUCAP" in the last 168 hours. Lipid Profile: No results for input(s): "CHOL", "HDL", "LDLCALC", "TRIG", "CHOLHDL", "LDLDIRECT" in the last 72 hours. Thyroid Function Tests: No results for input(s): "TSH", "T4TOTAL", "FREET4", "T3FREE", "THYROIDAB" in the last 72 hours. Anemia Panel: No results for input(s): "VITAMINB12", "FOLATE", "FERRITIN", "TIBC", "IRON", "RETICCTPCT" in the last 72 hours. Sepsis Labs: Recent Labs  Lab 10/10/23 2014 10/10/23 2320 10/11/23 0025 10/12/23 0313  LATICACIDVEN 4.9* 4.0* 3.4* 1.3    Recent Results (from the past 240 hours)  MRSA Next Gen by PCR, Nasal     Status: None   Collection Time: 10/11/23  2:25 PM   Specimen: Nasal Mucosa; Nasal Swab  Result Value Ref Range Status   MRSA by PCR Next Gen NOT DETECTED NOT DETECTED Final    Comment: (NOTE) The GeneXpert MRSA Assay (FDA approved for NASAL specimens only), is one component of a comprehensive MRSA colonization surveillance program. It is not intended to diagnose MRSA infection nor to guide or monitor treatment for MRSA infections. Test performance is not FDA approved in patients less than 41  years old. Performed at [facility], [address], [place], [name] [address]          Radiology Studies: DG HIP UNILAT W OR W/O PELVIS 2-3 VIEWS LEFT Result Date: 10/12/2023 CLINICAL DATA:  Status post total left hip arthroplasty. EXAM: DG HIP (WITH OR WITHOUT PELVIS) 2-3V LEFT COMPARISON:  Pelvis and left hip radiographs 10/11/2023 FINDINGS: There is diffuse decreased bone mineralization. Interval total left hip arthroplasty. Normal alignment. No perihardware lucency is seen to indicate hardware failure or loosening. Mild superomedial right femoroacetabular joint space narrowing. The bilateral sacroiliac and pubic symphysis joint spaces are maintained. A vascular phlebolith again overlies the right hemipelvis. Expected postoperative changes including lateral left  hip subcutaneous air and lateral left hip surgical skin staples. IMPRESSION: Interval total left hip arthroplasty without evidence of hardware failure. Electronically Signed   By: Bertina Broccoli M.D.   On: 10/12/2023 15:21   DG HIP UNILAT WITH PELVIS 1V LEFT Result Date: 10/12/2023 CLINICAL DATA:  Elective surgery. EXAM: DG HIP (WITH OR WITHOUT PELVIS) 1V*L* COMPARISON:  Radiograph yesterday FINDINGS: Fourteen fluoroscopic spot views of the pelvis and left hip obtained in the operating room. Sequential images during hip arthroplasty. Fluoroscopy time 18 seconds. Dose 1.8 mGy. IMPRESSION: Intraoperative fluoroscopy during left hip arthroplasty. Electronically Signed   By: Chadwick Colonel M.D.   On: 10/12/2023 13:09   DG C-Arm 1-60 Min-No Report Result Date: 10/12/2023 Fluoroscopy was utilized by the requesting physician.  No radiographic interpretation.   DG C-Arm 1-60 Min-No Report Result Date: 10/12/2023 Fluoroscopy was utilized by the requesting physician.  No radiographic interpretation.   DG C-Arm 1-60 Min-No Report Result Date: 10/12/2023 Fluoroscopy was utilized by the requesting physician.  No radiographic  interpretation.        Scheduled Meds:  sodium chloride    Intravenous Once   sodium chloride    Intravenous Once   Chlorhexidine  Gluconate Cloth  6 each Topical Daily   docusate sodium   100 mg Oral BID   feeding supplement  237 mL Oral BID BM   folic acid   1 mg Oral Daily   multivitamin with minerals  1 tablet Oral Daily   pantoprazole   40 mg Oral Daily   potassium chloride   40 mEq Oral Once   thiamine   100 mg Oral Daily   Or   thiamine   100 mg Intravenous Daily   traZODone   25 mg Oral Once   Continuous Infusions:     LOS: 4 days    Time spent: 45 minutes spent on chart review, discussion with nursing staff, consultants, updating family and interview/physical exam; more than 50% of that time was spent in counseling and/or coordination of care.    Enrigue Harvard, DO [facility] Available via Epic secure chat 7am-7pm After these hours, please refer to coverage provider listed on [email or web address] 10/14/2023, 11:29 AM

## 2023-10-15 DIAGNOSIS — E876 Hypokalemia: Secondary | ICD-10-CM | POA: Diagnosis not present

## 2023-10-15 DIAGNOSIS — S72009A Fracture of unspecified part of neck of unspecified femur, initial encounter for closed fracture: Secondary | ICD-10-CM

## 2023-10-15 DIAGNOSIS — F10930 Alcohol use, unspecified with withdrawal, uncomplicated: Secondary | ICD-10-CM | POA: Diagnosis not present

## 2023-10-15 DIAGNOSIS — R531 Weakness: Secondary | ICD-10-CM | POA: Diagnosis not present

## 2023-10-15 LAB — CBC
HCT: 26.6 % — ABNORMAL LOW (ref 39.0–52.0)
Hemoglobin: 8.5 g/dL — ABNORMAL LOW (ref 13.0–17.0)
MCH: 31.5 pg (ref 26.0–34.0)
MCHC: 32 g/dL (ref 30.0–36.0)
MCV: 98.5 fL (ref 80.0–100.0)
Platelets: 117 10*3/uL — ABNORMAL LOW (ref 150–400)
RBC: 2.7 MIL/uL — ABNORMAL LOW (ref 4.22–5.81)
RDW: 17.2 % — ABNORMAL HIGH (ref 11.5–15.5)
WBC: 5.4 10*3/uL (ref 4.0–10.5)
nRBC: 0 % (ref 0.0–0.2)

## 2023-10-15 LAB — BASIC METABOLIC PANEL WITH GFR
Anion gap: 7 (ref 5–15)
BUN: 11 mg/dL (ref 8–23)
CO2: 25 mmol/L (ref 22–32)
Calcium: 8.5 mg/dL — ABNORMAL LOW (ref 8.9–10.3)
Chloride: 102 mmol/L (ref 98–111)
Creatinine, Ser: 0.6 mg/dL — ABNORMAL LOW (ref 0.61–1.24)
GFR, Estimated: >60 mL/min (ref >60)
Glucose, Bld: 104 mg/dL — ABNORMAL HIGH (ref 70–99)
Potassium: 3.7 mmol/L (ref 3.5–5.1)
Sodium: 134 mmol/L — ABNORMAL LOW (ref 135–145)

## 2023-10-15 NOTE — Progress Notes (Signed)
 PROGRESS NOTE    Richard Andrews  ZOX:[id number] DOB: 09-09-1960 DOA: 10/10/2023 PCP: Cherre Cornish, NP    Brief Narrative:   DRISTEN BUDZYNSKI is a 63 y.o. male with medical history significant of hypertension, alcohol  use disorder with history of severe alcohol  withdrawal, cirrhosis, traumatic subdural hematoma.  Patient was recently admitted to the hospital 4/15-4/24 for acute lower GI bleed and anemia.  GI was consulted and deferred endoscopic evaluation.  Lower GI bleed resolved spontaneously.  Outpatient hematology follow-up was recommended for pancytopenia.  Patient could not be discharged to SNF so he was discharged home with home health services.  The day after discharge 4/25 patient visited the ED for alcohol  intoxication and right shoulder pain.  CT head was showing chronic stable right subdural hematoma and no acute findings.  X-ray of shoulder was negative for acute bony abnormality.  Patient was discharged from the ED.   Patient presents to the ED today via EMS for evaluation of generalized weakness.  Patient was laying in his bed for the past 2 days as he was too weak to get up.   Found to have a left hip fracture.  Hip replacement  5/1  Assessment and Plan:  Left hip fracture -ortho ([place]) consulted -unable to anticoagulate patient as too high risk with brain bleed and recheck GI bleeding OR on 5/1 - Given 2 units of packed red blood cells prior to procedure and will require another on 5/3 for a total of 3 units -trend hgb -PT/OT eval- SNF  Alcohol  withdrawal/Alcohol  use disorder, severe, dependence -Ethanol level 248 (previously as high as 370s).   -Symptoms improved after Librium  and Ativan .   -Placed on CIWA protocol; Ativan  as needed -Continue Librium  taper. -no current signs of withdrawal   Elevated lactate Likely due to chronic alcohol  use and dehydration.  No fever or leukocytosis to suggest infection/sepsis.   Hypokalemia and hypomagnesemia Mild QT  prolongation -repleted     Hypertension Blood pressure elevated and he does not take any medications at home.  IV hydralazine  PRN SBP >160.   Cirrhosis No signs of hepatic encephalopathy and ascites at this time.   Chronic anemia -3 units prbc    DVT prophylaxis: SCDs Start: 10/12/23 1531 Place TED hose Start: 10/12/23 1531 SCDs Start: 10/11/23 0002    Code Status: Full Code   Disposition Plan:  Level of care: Med-Surg Status is: Inpatient Remains inpatient appropriate    SNF when bed available  Consultants:  ortho   Subjective: No overnight issues  Objective: Vitals:   10/14/23 1430 10/14/23 1656 10/14/23 1925 10/15/23 0500  BP: 93/61 108/75 116/78 123/80  Pulse: 76 73 77 78  Resp: 16 16 16 18   Temp: 97.6 F (36.4 C) 98.4 F (36.9 C) 99.3 F (37.4 C) 99.6 F (37.6 C)  TempSrc: Oral Oral Oral Oral  SpO2: 100% 99% 98% 96%  Weight:      Height:        Intake/Output Summary (Last 24 hours) at 10/15/2023 1142 Last data filed at 10/15/2023 0500 Gross per 24 hour  Intake 322 ml  Output 1700 ml  Net -1378 ml   Filed Weights   10/11/23 1427 10/12/23 0901  Weight: 56.3 kg 56.3 kg    Examination:     General: Appearance:    Thin male in no acute distress     Lungs:      respirations unlabored  Heart:    Normal heart rate.    MS:  All extremities are intact.   Neurologic:   Awake, alert       Data Reviewed: I have personally reviewed following labs and imaging studies  CBC: Recent Labs  Lab 10/10/23 2010 10/11/23 0425 10/12/23 0313 10/13/23 0630 10/14/23 0826 10/15/23 0619  WBC 5.4 3.8* 4.4 5.5 4.2 5.4  NEUTROABS 3.6  --   --   --   --   --   HGB 9.9* 8.4* 12.0* 8.5* 7.0* 8.5*  HCT 29.4* 26.1* 35.9* 24.4* 21.9* 26.6*  MCV 98.0 100.8* 95.0 91.7 99.5 98.5  PLT 224 157 153 107* 113* 117*   Basic Metabolic Panel: Recent Labs  Lab 10/10/23 2010 10/11/23 0425 10/12/23 0313 10/13/23 0316 10/14/23 0826 10/15/23 0619  NA 135 136 130*  134* 135 134*  K 3.3* 3.4* 3.7 4.1 3.4* 3.7  CL 101 102 97* 104 102 102  CO2 21* 23 24 22 24 25   GLUCOSE 116* 105* 101* 117* 110* 104*  BUN 7* 6* 7* 15 15 11   CREATININE 0.53* 0.42* 0.61 0.63 0.75 0.60*  CALCIUM  8.5* 8.0* 8.6* 8.2* 7.9* 8.5*  MG 1.3* 1.9  --   --   --   --    GFR: Estimated Creatinine Clearance: 74.3 mL/min (A) (by C-G formula based on SCr of 0.6 mg/dL (L)). Liver Function Tests: Recent Labs  Lab 10/10/23 2010  AST 35  ALT 18  ALKPHOS 110  BILITOT 0.8  PROT 7.2  ALBUMIN  3.4*   No results for input(s): "LIPASE", "AMYLASE" in the last 168 hours. Recent Labs  Lab 10/10/23 2009  AMMONIA 25   Coagulation Profile: No results for input(s): "INR", "PROTIME" in the last 168 hours. Cardiac Enzymes: No results for input(s): "CKTOTAL", "CKMB", "CKMBINDEX", "TROPONINI" in the last 168 hours. BNP (last 3 results) No results for input(s): "PROBNP" in the last 8760 hours. HbA1C: No results for input(s): "HGBA1C" in the last 72 hours. CBG: No results for input(s): "GLUCAP" in the last 168 hours. Lipid Profile: No results for input(s): "CHOL", "HDL", "LDLCALC", "TRIG", "CHOLHDL", "LDLDIRECT" in the last 72 hours. Thyroid Function Tests: No results for input(s): "TSH", "T4TOTAL", "FREET4", "T3FREE", "THYROIDAB" in the last 72 hours. Anemia Panel: No results for input(s): "VITAMINB12", "FOLATE", "FERRITIN", "TIBC", "IRON", "RETICCTPCT" in the last 72 hours. Sepsis Labs: Recent Labs  Lab 10/10/23 2014 10/10/23 2320 10/11/23 0025 10/12/23 0313  LATICACIDVEN 4.9* 4.0* 3.4* 1.3    Recent Results (from the past 240 hours)  MRSA Next Gen by PCR, Nasal     Status: None   Collection Time: 10/11/23  2:25 PM   Specimen: Nasal Mucosa; Nasal Swab  Result Value Ref Range Status   MRSA by PCR Next Gen NOT DETECTED NOT DETECTED Final    Comment: (NOTE) The GeneXpert MRSA Assay (FDA approved for NASAL specimens only), is one component of a comprehensive MRSA colonization  surveillance program. It is not intended to diagnose MRSA infection nor to guide or monitor treatment for MRSA infections. Test performance is not FDA approved in patients less than 7 years old. Performed at [facility], [address], [place], [name] [address]          Radiology Studies: No results found.       Scheduled Meds:  sodium chloride    Intravenous Once   Chlorhexidine  Gluconate Cloth  6 each Topical Daily   docusate sodium   100 mg Oral BID   feeding supplement  237 mL Oral BID BM   folic acid   1  mg Oral Daily   multivitamin with minerals  1 tablet Oral Daily   pantoprazole   40 mg Oral Daily   thiamine   100 mg Oral Daily   Or   thiamine   100 mg Intravenous Daily   traZODone   25 mg Oral Once   Continuous Infusions:     LOS: 5 days    Time spent: 45 minutes spent on chart review, discussion with nursing staff, consultants, updating family and interview/physical exam; more than 50% of that time was spent in counseling and/or coordination of care.    Enrigue Harvard, DO [facility] Available via Epic secure chat 7am-7pm After these hours, please refer to coverage provider listed on [email or web address] 10/15/2023, 11:42 AM

## 2023-10-15 NOTE — Progress Notes (Signed)
 Physical Therapy Treatment Patient Details Name: Richard Andrews MRN: [id number] DOB: June 11, 1961 Today's Date: 10/15/2023   History of Present Illness [name] is a 63 y.o. male presents 10/10/23 for evaluation of generalized weakness. Of note, pt admitted 4/15-4/24 for acute lower GI bleed and anemia and 4/25 patient visited the ED for alcohol  intoxication and right shoulder pain; CT head was showing chronic stable right subdural hematoma and no acute findings; X-ray of shoulder was negative for acute bony abnormality. PMH: HTN, alcohol  use disorder with history of severe alcohol  withdrawal, cirrhosis, from last admission 4/15-4/24 patient found to have SDH, right Rib fractures 8,10,11, L1 anf T6 compression fractures.    PT Comments  Pt agreeable to working with therapy. Mod A +2 for safety for mobility on today. Increased time and effort for pt to complete tasks. Pain rated 8/10 with activity. Encouraged pt to sit up in recliner as tolerated. Patient will benefit from continued inpatient follow up therapy, <3 hours/day     If plan is discharge home, recommend the following: A lot of help with walking and/or transfers;A lot of help with bathing/dressing/bathroom;Assistance with cooking/housework;Assist for transportation;Help with stairs or ramp for entrance   Can travel by private vehicle     No  Equipment Recommendations   (TBD at next venue)    Recommendations for Other Services       Precautions / Restrictions Precautions Precautions: Fall Recall of Precautions/Restrictions: Impaired Restrictions Weight Bearing Restrictions Per Provider Order: No LLE Weight Bearing Per Provider Order: Weight bearing as tolerated     Mobility  Bed Mobility Overal bed mobility: Needs Assistance Bed Mobility: Supine to Sit     Supine to sit: Mod assist, HOB elevated, Used rails     General bed mobility comments: Assist for L LE. Increased time. Cues provided    Transfers Overall  transfer level: Needs assistance Equipment used: Rolling walker (2 wheels) Transfers: Sit to/from Stand Sit to Stand: Min assist, +2 physical assistance, +2 safety/equipment, From elevated surface           General transfer comment: Cues for safety, technique, hand placement. Assist to power up, stabilize, control descent.    Ambulation/Gait Ambulation/Gait assistance: Min assist, +2 safety/equipment Gait Distance (Feet): 15 Feet Assistive device: Rolling walker (2 wheels) Gait Pattern/deviations: Step-to pattern, Decreased step length - left, Antalgic, Wide base of support, Decreased step length - right, Decreased stride length       General Gait Details: Mod cues for sequencing, technique, proper use of RW, RW proximity. Assist to stabilize pt throughout distance. Distance limited by pain. Followed with recliner and used it to transport pt back to bedside.   Stairs             Wheelchair Mobility     Tilt Bed    Modified Rankin (Stroke Patients Only)       Balance Overall balance assessment: Needs assistance, History of Falls         Standing balance support: Bilateral upper extremity supported, During functional activity, Reliant on assistive device for balance                                Communication Communication Factors Affecting Communication: Reduced clarity of speech  Cognition Arousal: Alert Behavior During Therapy: Flat affect   PT - Cognitive impairments: Memory, Safety/Judgement, Problem solving, Orientation, Awareness   Orientation impairments: Time  Following commands: Impaired Following commands impaired: Follows one step commands with increased time    Cueing Cueing Techniques: Verbal cues  Exercises      General Comments        Pertinent Vitals/Pain Pain Assessment Pain Assessment: 0-10 Pain Score: 8  Pain Location: L hip with activity Pain Descriptors / Indicators: Aching,  Operative site guarding Pain Intervention(s): Limited activity within patient's tolerance, Monitored during session, Repositioned    Home Living                          Prior Function            PT Goals (current goals can now be found in the care plan section) Progress towards PT goals: Progressing toward goals    Frequency    Min 2X/week      PT Plan      Co-evaluation              AM-PAC PT "6 Clicks" Mobility   Outcome Measure  Help needed turning from your back to your side while in a flat bed without using bedrails?: A Lot Help needed moving from lying on your back to sitting on the side of a flat bed without using bedrails?: A Lot Help needed moving to and from a bed to a chair (including a wheelchair)?: A Little Help needed standing up from a chair using your arms (e.g., wheelchair or bedside chair)?: A Lot Help needed to walk in hospital room?: A Lot Help needed climbing 3-5 steps with a railing? : Total 6 Click Score: 12    End of Session Equipment Utilized During Treatment: Gait belt Activity Tolerance: Patient limited by pain;Patient limited by fatigue Patient left: in chair;with call bell/phone within reach;with chair alarm set   PT Visit Diagnosis: Muscle weakness (generalized) (M62.81);Pain;Repeated falls (R29.6);Other abnormalities of gait and mobility (R26.89);Difficulty in walking, not elsewhere classified (R26.2) Pain - Right/Left: Left Pain - part of body: Hip     Time: 6213-0865 PT Time Calculation (min) (ACUTE ONLY): 22 min  Charges:    $Gait Training: 8-22 mins PT General Charges $$ ACUTE PT VISIT: 1 Visit                         Tanda Falter, PT Acute Rehabilitation  Office: [phone]

## 2023-10-15 NOTE — TOC Progression Note (Signed)
 Transition of Care [facility]) - Progression Note    Patient Details  Name: Richard Andrews MRN: [id number] Date of Birth: 08-14-1960  Transition of Care [facility] CM/SW Contact  Levie Ream, RN Phone Number: 10/15/2023, 12:00 PM  Clinical Narrative:    LVM for Christy, Admissions at [facility]; attempted to receive update non ins auth; awaiting return call.   Expected Discharge Plan: Skilled Nursing Facility Barriers to Discharge: Continued Medical Work up  Expected Discharge Plan and Services In-house Referral: NA   Post Acute Care Choice: Skilled Nursing Facility Living arrangements for the past 2 months: Single Family Home                 DME Arranged: N/A DME Agency: NA       HH Arranged: NA HH Agency: NA         Social Determinants of Health (SDOH) Interventions SDOH Screenings   Food Insecurity: Food Insecurity Present (10/11/2023)  Housing: High Risk (10/11/2023)  Transportation Needs: No Transportation Needs (10/11/2023)  Recent Concern: Transportation Needs - Unmet Transportation Needs (09/26/2023)  Utilities: At Risk (10/11/2023)  Depression (PHQ2-9): Low Risk  (03/07/2023)  Social Connections: Unknown (08/01/2023)  Recent Concern: Social Connections - Socially Isolated (07/06/2023)  Tobacco Use: High Risk (10/12/2023)    Readmission Risk Interventions    10/12/2023    2:39 PM 07/11/2023    4:08 PM  Readmission Risk Prevention Plan  Transportation Screening Complete Complete  PCP or Specialist Appt within 3-5 Days  Complete  HRI or Home Care Consult  Complete  Social Work Consult for Recovery Care Planning/Counseling  Complete  Palliative Care Screening  Not Applicable  Medication Review Oceanographer) Complete Complete  PCP or Specialist appointment within 3-5 days of discharge Complete   HRI or Home Care Consult Complete   SW Recovery Care/Counseling Consult Complete   Palliative Care Screening Not Applicable   Skilled Nursing Facility Complete

## 2023-10-15 NOTE — Plan of Care (Signed)

## 2023-10-16 DIAGNOSIS — E876 Hypokalemia: Secondary | ICD-10-CM | POA: Diagnosis not present

## 2023-10-16 DIAGNOSIS — F10930 Alcohol use, unspecified with withdrawal, uncomplicated: Secondary | ICD-10-CM | POA: Diagnosis not present

## 2023-10-16 DIAGNOSIS — R531 Weakness: Secondary | ICD-10-CM | POA: Diagnosis not present

## 2023-10-16 LAB — TYPE AND SCREEN
ABO/RH(D): O POS
Antibody Screen: NEGATIVE
Unit division: 0
Unit division: 0
Unit division: 0
Unit division: 0

## 2023-10-16 LAB — CBC
HCT: 27.2 % — ABNORMAL LOW (ref 39.0–52.0)
Hemoglobin: 8.6 g/dL — ABNORMAL LOW (ref 13.0–17.0)
MCH: 31.3 pg (ref 26.0–34.0)
MCHC: 31.6 g/dL (ref 30.0–36.0)
MCV: 98.9 fL (ref 80.0–100.0)
Platelets: 116 10*3/uL — ABNORMAL LOW (ref 150–400)
RBC: 2.75 MIL/uL — ABNORMAL LOW (ref 4.22–5.81)
RDW: 17 % — ABNORMAL HIGH (ref 11.5–15.5)
WBC: 3.9 10*3/uL — ABNORMAL LOW (ref 4.0–10.5)
nRBC: 0 % (ref 0.0–0.2)

## 2023-10-16 LAB — BASIC METABOLIC PANEL WITH GFR
Anion gap: 7 (ref 5–15)
BUN: 11 mg/dL (ref 8–23)
CO2: 26 mmol/L (ref 22–32)
Calcium: 8.4 mg/dL — ABNORMAL LOW (ref 8.9–10.3)
Chloride: 101 mmol/L (ref 98–111)
Creatinine, Ser: 0.71 mg/dL (ref 0.61–1.24)
GFR, Estimated: >60 mL/min (ref >60)
Glucose, Bld: 102 mg/dL — ABNORMAL HIGH (ref 70–99)
Potassium: 3.3 mmol/L — ABNORMAL LOW (ref 3.5–5.1)
Sodium: 134 mmol/L — ABNORMAL LOW (ref 135–145)

## 2023-10-16 LAB — BPAM RBC
Blood Product Expiration Date: 202505272359
Blood Product Expiration Date: 202505282359
Blood Product Expiration Date: 202506022359
Blood Product Expiration Date: 202506022359
ISSUE DATE / TIME: 202504301601
ISSUE DATE / TIME: 202504302104
ISSUE DATE / TIME: 202505031239
Unit Type and Rh: 5100
Unit Type and Rh: 5100
Unit Type and Rh: 5100
Unit Type and Rh: 5100

## 2023-10-16 MED ORDER — POTASSIUM CHLORIDE CRYS ER 20 MEQ PO TBCR
40.0000 meq | EXTENDED_RELEASE_TABLET | Freq: Once | ORAL | Status: AC
  Administered 2023-10-16: 40 meq via ORAL
  Filled 2023-10-16: qty 2

## 2023-10-16 NOTE — Progress Notes (Signed)
 Physical Therapy Treatment Patient Details Name: Richard Andrews MRN: [id number] DOB: 04/13/1961 Today's Date: 10/16/2023   History of Present Illness Richard Andrews is a 63 y.o. male presents 10/10/23 for evaluation of generalized weakness. Of note, pt admitted 4/15-4/24 for acute lower GI bleed and anemia and 4/25 patient visited the ED for alcohol  intoxication and right shoulder pain; CT head was showing chronic stable right subdural hematoma and no acute findings; X-ray of shoulder was negative for acute bony abnormality. PMH: HTN, alcohol  use disorder with history of severe alcohol  withdrawal, cirrhosis, from last admission 4/15-4/24 patient found to have SDH, right Rib fractures 8,10,11, L1 anf T6 compression fractures.    PT Comments  Pt agreeable to working with therapy.Continues to report mod-severe pain with activity. Progressing slowly with mobility. Patient will benefit from continued inpatient follow up therapy, <3 hours/day     If plan is discharge home, recommend the following: A lot of help with walking and/or transfers;A lot of help with bathing/dressing/bathroom;Assistance with cooking/housework;Assist for transportation;Help with stairs or ramp for entrance   Can travel by private vehicle     No  Equipment Recommendations       Recommendations for Other Services       Precautions / Restrictions Precautions Precautions: Fall Restrictions Weight Bearing Restrictions Per Provider Order: No LLE Weight Bearing Per Provider Order: Weight bearing as tolerated     Mobility  Bed Mobility Overal bed mobility: Needs Assistance Bed Mobility: Supine to Sit, Sit to Supine     Supine to sit: Min assist, HOB elevated, Used rails Sit to supine: Min assist, HOB elevated, Used rails   General bed mobility comments: Assist for L LE. Increased timeand efortful for pt. Cues provided    Transfers Overall transfer level: Needs assistance Equipment used: Rolling walker (2  wheels) Transfers: Sit to/from Stand Sit to Stand: Mod assist           General transfer comment: Cues for safety, technique, hand placement. Assist to power up, stabilize, control descent.    Ambulation/Gait Ambulation/Gait assistance: Min assist, +2 safety/equipment Gait Distance (Feet): 20 Feet Assistive device: Rolling walker (2 wheels) Gait Pattern/deviations: Step-to pattern, Antalgic, Trunk flexed, Decreased stance time - left, Shuffle       General Gait Details: Mod cues for sequencing, technique, proper use of RW, RW proximity. Assist to stabilize pt throughout distance. Distance limited by pain. Very slow and effortful gait   Stairs             Wheelchair Mobility     Tilt Bed    Modified Rankin (Stroke Patients Only)       Balance Overall balance assessment: Needs assistance, History of Falls         Standing balance support: Bilateral upper extremity supported, During functional activity, Reliant on assistive device for balance Standing balance-Leahy Scale: Poor                              Communication Communication Communication: No apparent difficulties Factors Affecting Communication: Reduced clarity of speech  Cognition Arousal: Alert Behavior During Therapy: Flat affect   PT - Cognitive impairments: Memory, Safety/Judgement, Problem solving, Orientation, Awareness                         Following commands: Impaired Following commands impaired: Follows one step commands with increased time    Cueing Cueing Techniques: Verbal cues  Exercises      General Comments        Pertinent Vitals/Pain Pain Assessment Pain Assessment: Faces Faces Pain Scale: Hurts whole lot Pain Location: L hip with activity Pain Descriptors / Indicators: Aching, Operative site guarding Pain Intervention(s): Limited activity within patient's tolerance, Monitored during session, Repositioned    Home Living                           Prior Function            PT Goals (current goals can now be found in the care plan section) Progress towards PT goals: Progressing toward goals    Frequency    Min 2X/week      PT Plan      Co-evaluation              AM-PAC PT "6 Clicks" Mobility   Outcome Measure  Help needed turning from your back to your side while in a flat bed without using bedrails?: A Lot Help needed moving from lying on your back to sitting on the side of a flat bed without using bedrails?: A Lot Help needed moving to and from a bed to a chair (including a wheelchair)?: A Little Help needed standing up from a chair using your arms (e.g., wheelchair or bedside chair)?: A Lot Help needed to walk in hospital room?: A Lot Help needed climbing 3-5 steps with a railing? : Total 6 Click Score: 12    End of Session Equipment Utilized During Treatment: Gait belt Activity Tolerance: Patient limited by pain;Patient limited by fatigue Patient left: in bed;with call bell/phone within reach;with bed alarm set   PT Visit Diagnosis: Muscle weakness (generalized) (M62.81);Pain;Repeated falls (R29.6);Other abnormalities of gait and mobility (R26.89);Difficulty in walking, not elsewhere classified (R26.2) Pain - Right/Left: Left Pain - part of body: Hip     Time: 6962-9528 PT Time Calculation (min) (ACUTE ONLY): 20 min  Charges:    $Gait Training: 8-22 mins PT General Charges $$ ACUTE PT VISIT: 1 Visit                         Tanda Falter, PT Acute Rehabilitation  Office: [phone]

## 2023-10-16 NOTE — Plan of Care (Signed)
  Problem: Education: Goal: Knowledge of General Education information will improve Description: Including pain rating scale, medication(s)/side effects and non-pharmacologic comfort measures Outcome: Progressing   Problem: Health Behavior/Discharge Planning: Goal: Ability to manage health-related needs will improve Outcome: Progressing   Problem: Clinical Measurements: Goal: Ability to maintain clinical measurements within normal limits will improve Outcome: Progressing Goal: Will remain free from infection Outcome: Progressing Goal: Diagnostic test results will improve Outcome: Progressing Goal: Respiratory complications will improve Outcome: Progressing Goal: Cardiovascular complication will be avoided Outcome: Progressing   Problem: Nutrition: Goal: Adequate nutrition will be maintained Outcome: Progressing   Problem: Coping: Goal: Level of anxiety will decrease Outcome: Progressing   Problem: Elimination: Goal: Will not experience complications related to bowel motility Outcome: Progressing Goal: Will not experience complications related to urinary retention Outcome: Progressing   Problem: Pain Managment: Goal: General experience of comfort will improve and/or be controlled Outcome: Progressing   Problem: Safety: Goal: Ability to remain free from injury will improve Outcome: Progressing   Problem: Skin Integrity: Goal: Risk for impaired skin integrity will decrease Outcome: Progressing   Problem: Education: Goal: Understanding of discharge needs will improve Outcome: Progressing Goal: Individualized Educational Video(s) Outcome: Progressing   Problem: Activity: Goal: Ability to tolerate increased activity will improve Outcome: Progressing   Problem: Clinical Measurements: Goal: Postoperative complications will be avoided or minimized Outcome: Progressing   Problem: Pain Management: Goal: Pain level will decrease with appropriate  interventions Outcome: Progressing   Problem: Skin Integrity: Goal: Will show signs of wound healing Outcome: Progressing   Problem: Activity: Goal: Risk for activity intolerance will decrease Outcome: Not Progressing   Problem: Education: Goal: Knowledge of the prescribed therapeutic regimen will improve Outcome: Not Progressing   Problem: Activity: Goal: Ability to avoid complications of mobility impairment will improve Outcome: Not Progressing

## 2023-10-16 NOTE — TOC Progression Note (Addendum)
 Transition of Care [facility]) - Progression Note    Patient Details  Name: Richard Andrews MRN: [id number] Date of Birth: 1961-03-14  Transition of Care [facility]) CM/SW Contact  Jonni Nettle, LCSW Phone Number: 10/16/2023, 10:32 AM  Clinical Narrative:     ADDENDUM  CSW received phone call from [place] at [facility]. Per Kathaleen Pale, pt has been declined [facility] for SNF placement. TOC will continue to follow.  CSW spoke with Kathaleen Pale [phone], at [facility], via phone call regarding pt's insurance authorization. Per Kathaleen Pale, she will start insurance authorization today. Next available bed is on Wednesday 5/7. TOC will continue to follow.   Expected Discharge Plan: Skilled Nursing Facility Barriers to Discharge: Continued Medical Work up  Expected Discharge Plan and Services In-house Referral: NA   Post Acute Care Choice: Skilled Nursing Facility Living arrangements for the past 2 months: Single Family Home                 DME Arranged: N/A DME Agency: NA       HH Arranged: NA HH Agency: NA        Social Determinants of Health (SDOH) Interventions SDOH Screenings   Food Insecurity: Food Insecurity Present (10/11/2023)  Housing: High Risk (10/11/2023)  Transportation Needs: No Transportation Needs (10/11/2023)  Recent Concern: Transportation Needs - Unmet Transportation Needs (09/26/2023)  Utilities: At Risk (10/11/2023)  Depression (PHQ2-9): Low Risk  (03/07/2023)  Social Connections: Unknown (08/01/2023)  Recent Concern: Social Connections - Socially Isolated (07/06/2023)  Tobacco Use: High Risk (10/12/2023)    Readmission Risk Interventions    10/12/2023    2:39 PM 07/11/2023    4:08 PM  Readmission Risk Prevention Plan  Transportation Screening Complete Complete  PCP or Specialist Appt within 3-5 Days  Complete  HRI or Home Care Consult  Complete  Social Work Consult for Recovery Care Planning/Counseling  Complete  Palliative Care Screening  Not Applicable   Medication Review Oceanographer) Complete Complete  PCP or Specialist appointment within 3-5 days of discharge Complete   HRI or Home Care Consult Complete   SW Recovery Care/Counseling Consult Complete   Palliative Care Screening Not Applicable   Skilled Nursing Facility Complete

## 2023-10-16 NOTE — Progress Notes (Signed)
 PROGRESS NOTE    Richard Andrews  RUE:[id number] DOB: 1961-03-30 DOA: 10/10/2023 PCP: Cherre Cornish, NP    Brief Narrative:   Richard Andrews is a 63 y.o. male with medical history significant of hypertension, alcohol  use disorder with history of severe alcohol  withdrawal, cirrhosis, traumatic subdural hematoma.  Patient was recently admitted to the hospital 4/15-4/24 for acute lower GI bleed and anemia.  GI was consulted and deferred endoscopic evaluation.  Lower GI bleed resolved spontaneously.  Outpatient hematology follow-up was recommended for pancytopenia.  Patient could not be discharged to SNF so he was discharged home with home health services.  The day after discharge 4/25 patient visited the ED for alcohol  intoxication and right shoulder pain.  CT head was showing chronic stable right subdural hematoma and no acute findings.  X-ray of shoulder was negative for acute bony abnormality.  Patient was discharged from the ED.   Patient presents to the ED today via EMS for evaluation of generalized weakness.  Patient was laying in his bed for the past 2 days as he was too weak to get up.   Found to have a left hip fracture.  Hip replacement  5/1  Assessment and Plan:  Left hip fracture -ortho ([place]ck) consulted -unable to anticoagulate patient as too high risk with brain bleed and recheck GI bleeding OR on 5/1 - Given 2 units of packed red blood cells prior to procedure and will require another on 5/3 for a total of 3 units -trend hgb -PT/OT eval- SNF - Incentive spirometer  Alcohol  withdrawal/Alcohol  use disorder, severe, dependence -Ethanol level 248 (previously as high as 370s).   -Symptoms improved after Librium  and Ativan .   -Placed on CIWA protocol; Ativan  as needed -Continue Librium  taper. -no current signs of withdrawal   Elevated lactate Likely due to chronic alcohol  use and dehydration   Hypokalemia and hypomagnesemia Mild QT prolongation -repleted    Hypertension Blood pressure elevated and he does not take any medications at home.  IV hydralazine  PRN SBP >160.   Cirrhosis No signs of hepatic encephalopathy and ascites at this time.   Chronic anemia -s/p 3 units prbc   Hypokalemia - Replete  DVT prophylaxis: SCDs Start: 10/12/23 1531 Place TED hose Start: 10/12/23 1531 SCDs Start: 10/11/23 0002    Code Status: Full Code   Disposition Plan:  Level of care: Med-Surg Status is: Inpatient Remains inpatient appropriate    SNF when bed available-appears that insurance authorization was not started until 5/5 is a bed will not be available until 5/7  Consultants:  ortho   Subjective: No shortness of breath, no chest pain  Objective: Vitals:   10/15/23 1505 10/15/23 2009 10/16/23 0450 10/16/23 0540  BP: 112/74 98/68 101/68   Pulse: 70 72 82   Resp: 18 16 16    Temp: 98.7 F (37.1 C) 98.6 F (37 C) 100.2 F (37.9 C) (P) 99.8 F (37.7 C)  TempSrc: Oral Oral Oral (P) Oral  SpO2: 98% 99% 98%   Weight:      Height:        Intake/Output Summary (Last 24 hours) at 10/16/2023 1144 Last data filed at 10/16/2023 0905 Gross per 24 hour  Intake 240 ml  Output 500 ml  Net -260 ml   Filed Weights   10/11/23 1427 10/12/23 0901  Weight: 56.3 kg 56.3 kg    Examination:     General: Appearance:    Thin male in no acute distress     Lungs:  respirations unlabored  Heart:    Normal heart rate.    MS:   All extremities are intact.   Neurologic:   Awake, alert       Data Reviewed: I have personally reviewed following labs and imaging studies  CBC: Recent Labs  Lab 10/10/23 2010 10/11/23 0425 10/12/23 0313 10/13/23 0630 10/14/23 0826 10/15/23 0619 10/16/23 0943  WBC 5.4   < > 4.4 5.5 4.2 5.4 3.9*  NEUTROABS 3.6  --   --   --   --   --   --   HGB 9.9*   < > 12.0* 8.5* 7.0* 8.5* 8.6*  HCT 29.4*   < > 35.9* 24.4* 21.9* 26.6* 27.2*  MCV 98.0   < > 95.0 91.7 99.5 98.5 98.9  PLT 224   < > 153 107* 113*  117* 116*   < > = values in this interval not displayed.   Basic Metabolic Panel: Recent Labs  Lab 10/10/23 2010 10/11/23 0425 10/12/23 0313 10/13/23 0316 10/14/23 0826 10/15/23 0619 10/16/23 0943  NA 135 136 130* 134* 135 134* 134*  K 3.3* 3.4* 3.7 4.1 3.4* 3.7 3.3*  CL 101 102 97* 104 102 102 101  CO2 21* 23 24 22 24 25 26   GLUCOSE 116* 105* 101* 117* 110* 104* 102*  BUN 7* 6* 7* 15 15 11 11   CREATININE 0.53* 0.42* 0.61 0.63 0.75 0.60* 0.71  CALCIUM  8.5* 8.0* 8.6* 8.2* 7.9* 8.5* 8.4*  MG 1.3* 1.9  --   --   --   --   --    GFR: Estimated Creatinine Clearance: 74.3 mL/min (by C-G formula based on SCr of 0.71 mg/dL). Liver Function Tests: Recent Labs  Lab 10/10/23 2010  AST 35  ALT 18  ALKPHOS 110  BILITOT 0.8  PROT 7.2  ALBUMIN  3.4*   No results for input(s): "LIPASE", "AMYLASE" in the last 168 hours. Recent Labs  Lab 10/10/23 2009  AMMONIA 25   Coagulation Profile: No results for input(s): "INR", "PROTIME" in the last 168 hours. Cardiac Enzymes: No results for input(s): "CKTOTAL", "CKMB", "CKMBINDEX", "TROPONINI" in the last 168 hours. BNP (last 3 results) No results for input(s): "PROBNP" in the last 8760 hours. HbA1C: No results for input(s): "HGBA1C" in the last 72 hours. CBG: No results for input(s): "GLUCAP" in the last 168 hours. Lipid Profile: No results for input(s): "CHOL", "HDL", "LDLCALC", "TRIG", "CHOLHDL", "LDLDIRECT" in the last 72 hours. Thyroid Function Tests: No results for input(s): "TSH", "T4TOTAL", "FREET4", "T3FREE", "THYROIDAB" in the last 72 hours. Anemia Panel: No results for input(s): "VITAMINB12", "FOLATE", "FERRITIN", "TIBC", "IRON", "RETICCTPCT" in the last 72 hours. Sepsis Labs: Recent Labs  Lab 10/10/23 2014 10/10/23 2320 10/11/23 0025 10/12/23 0313  LATICACIDVEN 4.9* 4.0* 3.4* 1.3    Recent Results (from the past 240 hours)  MRSA Next Gen by PCR, Nasal     Status: None   Collection Time: 10/11/23  2:25 PM    Specimen: Nasal Mucosa; Nasal Swab  Result Value Ref Range Status   MRSA by PCR Next Gen NOT DETECTED NOT DETECTED Final    Comment: (NOTE) The GeneXpert MRSA Assay (FDA approved for NASAL specimens only), is one component of a comprehensive MRSA colonization surveillance program. It is not intended to diagnose MRSA infection nor to guide or monitor treatment for MRSA infections. Test performance is not FDA approved in patients less than 41 years old. Performed at [facility], [address], [place], [name] [address]  Radiology Studies: No results found.       Scheduled Meds:  sodium chloride    Intravenous Once   Chlorhexidine  Gluconate Cloth  6 each Topical Daily   docusate sodium   100 mg Oral BID   feeding supplement  237 mL Oral BID BM   folic acid   1 mg Oral Daily   multivitamin with minerals  1 tablet Oral Daily   pantoprazole   40 mg Oral Daily   thiamine   100 mg Oral Daily   Or   thiamine   100 mg Intravenous Daily   traZODone   25 mg Oral Once   Continuous Infusions:     LOS: 6 days    Time spent: 45 minutes spent on chart review, discussion with nursing staff, consultants, updating family and interview/physical exam; more than 50% of that time was spent in counseling and/or coordination of care.    Enrigue Harvard, DO [facility] Available via Epic secure chat 7am-7pm After these hours, please refer to coverage provider listed on [email or web address] 10/16/2023, 11:44 AM

## 2023-10-16 NOTE — TOC Progression Note (Signed)
 Transition of Care [facility]) - Progression Note    Patient Details  Name: Richard Andrews MRN: [id number] Date of Birth: [birth year]  Transition of Care [facility]) CM/SW Contact  Jonni Nettle, LCSW Phone Number: 10/16/2023, 2:24 PM  Clinical Narrative:    CSW met with pt at bedside to discuss bed offer for SNF placement. CSW advised pt that [facility] declined pt for SNF bed. CSW advised pt's only other offer is with [facility]. Pt accepted bed offer at [facility]. CSW spoke with Sallyanne Creamer at [facility], who reports she will begin request for insurance authorization process. TOC will continue to follow.   Expected Discharge Plan: Skilled Nursing Facility Barriers to Discharge: Continued Medical Work up  Expected Discharge Plan and Services In-house Referral: NA   Post Acute Care Choice: Skilled Nursing Facility Living arrangements for the past 2 months: Single Family Home                 DME Arranged: N/A DME Agency: NA       HH Arranged: NA HH Agency: NA         Social Determinants of Health (SDOH) Interventions SDOH Screenings   Food Insecurity: Food Insecurity Present (10/11/2023)  Housing: High Risk (10/11/2023)  Transportation Needs: No Transportation Needs (10/11/2023)  Recent Concern: Transportation Needs - Unmet Transportation Needs (09/26/2023)  Utilities: At Risk (10/11/2023)  Depression (PHQ2-9): Low Risk  (03/07/2023)  Social Connections: Unknown (08/01/2023)  Recent Concern: Social Connections - Socially Isolated (07/06/2023)  Tobacco Use: High Risk (10/12/2023)    Readmission Risk Interventions    10/12/2023    2:39 PM 07/11/2023    4:08 PM  Readmission Risk Prevention Plan  Transportation Screening Complete Complete  PCP or Specialist Appt within 3-5 Days  Complete  HRI or Home Care Consult  Complete  Social Work Consult for Recovery Care Planning/Counseling  Complete  Palliative Care Screening  Not Applicable  Medication Review Oceanographer) Complete Complete   PCP or Specialist appointment within 3-5 days of discharge Complete   HRI or Home Care Consult Complete   SW Recovery Care/Counseling Consult Complete   Palliative Care Screening Not Applicable   Skilled Nursing Facility Complete     [place], MSW, LCSW 10/16/2023 2:27 PM

## 2023-10-16 NOTE — Progress Notes (Signed)
 PT Cancellation Note  Patient Details Name: Richard Andrews MRN: [id number] DOB: 05-Nov-1960   Cancelled Treatment:    Reason Eval/Treat Not Completed:  Attempted PT tx sesson-pt prefers to have something for pain before therapy. Contacted RN thru secure chat. Will await pt receiving pain meds before returning for therapy session.    Tanda Falter, PT Acute Rehabilitation  Office: [phone]

## 2023-10-17 DIAGNOSIS — F10930 Alcohol use, unspecified with withdrawal, uncomplicated: Secondary | ICD-10-CM | POA: Diagnosis not present

## 2023-10-17 DIAGNOSIS — E876 Hypokalemia: Secondary | ICD-10-CM | POA: Diagnosis not present

## 2023-10-17 DIAGNOSIS — R531 Weakness: Secondary | ICD-10-CM | POA: Diagnosis not present

## 2023-10-17 LAB — BASIC METABOLIC PANEL WITH GFR
Anion gap: 7 (ref 5–15)
BUN: 11 mg/dL (ref 8–23)
CO2: 24 mmol/L (ref 22–32)
Calcium: 8.5 mg/dL — ABNORMAL LOW (ref 8.9–10.3)
Chloride: 103 mmol/L (ref 98–111)
Creatinine, Ser: 0.49 mg/dL — ABNORMAL LOW (ref 0.61–1.24)
GFR, Estimated: >60 mL/min (ref >60)
Glucose, Bld: 135 mg/dL — ABNORMAL HIGH (ref 70–99)
Potassium: 3.7 mmol/L (ref 3.5–5.1)
Sodium: 134 mmol/L — ABNORMAL LOW (ref 135–145)

## 2023-10-17 LAB — CBC
HCT: 27 % — ABNORMAL LOW (ref 39.0–52.0)
Hemoglobin: 8.5 g/dL — ABNORMAL LOW (ref 13.0–17.0)
MCH: 31.3 pg (ref 26.0–34.0)
MCHC: 31.5 g/dL (ref 30.0–36.0)
MCV: 99.3 fL (ref 80.0–100.0)
Platelets: 122 10*3/uL — ABNORMAL LOW (ref 150–400)
RBC: 2.72 MIL/uL — ABNORMAL LOW (ref 4.22–5.81)
RDW: 16.7 % — ABNORMAL HIGH (ref 11.5–15.5)
WBC: 3.2 10*3/uL — ABNORMAL LOW (ref 4.0–10.5)
nRBC: 0 % (ref 0.0–0.2)

## 2023-10-17 LAB — MAGNESIUM: Magnesium: 1.6 mg/dL — ABNORMAL LOW (ref 1.7–2.4)

## 2023-10-17 MED ORDER — MAGNESIUM SULFATE 2 GM/50ML IV SOLN
2.0000 g | Freq: Once | INTRAVENOUS | Status: AC
Start: 2023-10-17 — End: 2023-10-17
  Administered 2023-10-17: 2 g via INTRAVENOUS
  Filled 2023-10-17: qty 50

## 2023-10-17 NOTE — Plan of Care (Signed)

## 2023-10-17 NOTE — Plan of Care (Signed)
  Problem: Education: Goal: Knowledge of General Education information will improve Description: Including pain rating scale, medication(s)/side effects and non-pharmacologic comfort measures Outcome: Progressing   Problem: Clinical Measurements: Goal: Ability to maintain clinical measurements within normal limits will improve Outcome: Progressing   Problem: Clinical Measurements: Goal: Will remain free from infection Outcome: Progressing   Problem: Activity: Goal: Risk for activity intolerance will decrease Outcome: Progressing   Problem: Nutrition: Goal: Adequate nutrition will be maintained Outcome: Progressing

## 2023-10-17 NOTE — Progress Notes (Signed)
 PROGRESS NOTE    Richard Andrews  PIR:[id number] DOB: 05/18/1961 DOA: 10/10/2023 PCP: Cherre Cornish, NP    Brief Narrative:   Richard Andrews is a 63 y.o. male with medical history significant of hypertension, alcohol  use disorder with history of severe alcohol  withdrawal, cirrhosis, traumatic subdural hematoma.  Patient was recently admitted to the hospital 4/15-4/24 for acute lower GI bleed and anemia.  GI was consulted and deferred endoscopic evaluation.  Lower GI bleed resolved spontaneously.  Outpatient hematology follow-up was recommended for pancytopenia.  Patient could not be discharged to SNF so he was discharged home with home health services.  The day after discharge 4/25 patient visited the ED for alcohol  intoxication and right shoulder pain.  CT head was showing chronic stable right subdural hematoma and no acute findings.  X-ray of shoulder was negative for acute bony abnormality.  Patient was discharged from the ED.   Patient presents to the ED today via EMS for evaluation of generalized weakness.  Patient was laying in his bed for the past 2 days as he was too weak to get up.   Found to have a left hip fracture.  Hip replacement  5/1.  Await SNF placement, per TOC there will be a bed on 5/7  Assessment and Plan:  Left hip fracture -ortho ([place]) consulted -unable to anticoagulate patient as too high risk with brain bleed and recheck GI bleeding OR on 5/1 - Given 2 units of packed red blood cells prior to procedure and will require another on 5/3 for a total of 3 units - Hemoglobin stable -PT/OT eval- SNF - Incentive spirometer  Alcohol  withdrawal/Alcohol  use disorder, severe, dependence -Ethanol level 248 (previously as high as 370s).   -Symptoms improved after Librium  and Ativan .   -Placed on CIWA protocol; Ativan  as needed -Continue Librium  taper. -no current signs of withdrawal   Elevated lactate Likely due to chronic alcohol  use and dehydration   Hypokalemia and  hypomagnesemia Mild QT prolongation -repleted   Hypertension Blood pressure elevated and he does not take any medications at home.  IV hydralazine  PRN SBP >160.   Cirrhosis No signs of hepatic encephalopathy and ascites at this time.   Chronic anemia -s/p 3 units prbc   Hypokalemia - Replete  DVT prophylaxis: SCDs Start: 10/12/23 1531 Place TED hose Start: 10/12/23 1531 SCDs Start: 10/11/23 0002    Code Status: Full Code   Disposition Plan:  Level of care: Med-Surg Status is: Inpatient Remains inpatient appropriate    SNF when bed available-appears that insurance authorization was not started until 5/5 is a bed will not be available until 5/7  Consultants:  ortho   Subjective: No overnight events, no complaints  Objective: Vitals:   10/16/23 1228 10/16/23 1914 10/17/23 0453 10/17/23 0755  BP: 102/69 101/66 112/70 107/71  Pulse: 73 76 76 82  Resp: 17 18 15    Temp: 98.4 F (36.9 C) 98.6 F (37 C) 98.3 F (36.8 C)   TempSrc: Oral  Oral   SpO2: 99% 97% 98%   Weight:      Height:        Intake/Output Summary (Last 24 hours) at 10/17/2023 1052 Last data filed at 10/17/2023 0903 Gross per 24 hour  Intake 360 ml  Output 1100 ml  Net -740 ml   Filed Weights   10/11/23 1427 10/12/23 0901  Weight: 56.3 kg 56.3 kg    Examination:     General: Appearance:    Thin male in no acute  distress     Lungs:      respirations unlabored  Heart:    Normal heart rate.    MS:   All extremities are intact.   Neurologic:   Awake, alert       Data Reviewed: I have personally reviewed following labs and imaging studies  CBC: Recent Labs  Lab 10/10/23 2010 10/11/23 0425 10/13/23 0630 10/14/23 0826 10/15/23 0619 10/16/23 0943 10/17/23 0600  WBC 5.4   < > 5.5 4.2 5.4 3.9* 3.2*  NEUTROABS 3.6  --   --   --   --   --   --   HGB 9.9*   < > 8.5* 7.0* 8.5* 8.6* 8.5*  HCT 29.4*   < > 24.4* 21.9* 26.6* 27.2* 27.0*  MCV 98.0   < > 91.7 99.5 98.5 98.9 99.3  PLT 224    < > 107* 113* 117* 116* 122*   < > = values in this interval not displayed.   Basic Metabolic Panel: Recent Labs  Lab 10/10/23 2010 10/11/23 0425 10/12/23 1191 10/13/23 0316 10/14/23 0826 10/15/23 0619 10/16/23 0943 10/17/23 0600  NA 135 136   < > 134* 135 134* 134* 134*  K 3.3* 3.4*   < > 4.1 3.4* 3.7 3.3* 3.7  CL 101 102   < > 104 102 102 101 103  CO2 21* 23   < > 22 24 25 26 24   GLUCOSE 116* 105*   < > 117* 110* 104* 102* 135*  BUN 7* 6*   < > 15 15 11 11 11   CREATININE 0.53* 0.42*   < > 0.63 0.75 0.60* 0.71 0.49*  CALCIUM  8.5* 8.0*   < > 8.2* 7.9* 8.5* 8.4* 8.5*  MG 1.3* 1.9  --   --   --   --   --  1.6*   < > = values in this interval not displayed.   GFR: Estimated Creatinine Clearance: 74.3 mL/min (A) (by C-G formula based on SCr of 0.49 mg/dL (L)). Liver Function Tests: Recent Labs  Lab 10/10/23 2010  AST 35  ALT 18  ALKPHOS 110  BILITOT 0.8  PROT 7.2  ALBUMIN  3.4*   No results for input(s): "LIPASE", "AMYLASE" in the last 168 hours. Recent Labs  Lab 10/10/23 2009  AMMONIA 25   Coagulation Profile: No results for input(s): "INR", "PROTIME" in the last 168 hours. Cardiac Enzymes: No results for input(s): "CKTOTAL", "CKMB", "CKMBINDEX", "TROPONINI" in the last 168 hours. BNP (last 3 results) No results for input(s): "PROBNP" in the last 8760 hours. HbA1C: No results for input(s): "HGBA1C" in the last 72 hours. CBG: No results for input(s): "GLUCAP" in the last 168 hours. Lipid Profile: No results for input(s): "CHOL", "HDL", "LDLCALC", "TRIG", "CHOLHDL", "LDLDIRECT" in the last 72 hours. Thyroid Function Tests: No results for input(s): "TSH", "T4TOTAL", "FREET4", "T3FREE", "THYROIDAB" in the last 72 hours. Anemia Panel: No results for input(s): "VITAMINB12", "FOLATE", "FERRITIN", "TIBC", "IRON", "RETICCTPCT" in the last 72 hours. Sepsis Labs: Recent Labs  Lab 10/10/23 2014 10/10/23 2320 10/11/23 0025 10/12/23 0313  LATICACIDVEN 4.9* 4.0* 3.4*  1.3    Recent Results (from the past 240 hours)  MRSA Next Gen by PCR, Nasal     Status: None   Collection Time: 10/11/23  2:25 PM   Specimen: Nasal Mucosa; Nasal Swab  Result Value Ref Range Status   MRSA by PCR Next Gen NOT DETECTED NOT DETECTED Final    Comment: (NOTE) The GeneXpert MRSA Assay (FDA  approved for NASAL specimens only), is one component of a comprehensive MRSA colonization surveillance program. It is not intended to diagnose MRSA infection nor to guide or monitor treatment for MRSA infections. Test performance is not FDA approved in patients less than 27 years old. Performed at [facility], [address], [place], [name] [address]          Radiology Studies: No results found.       Scheduled Meds:  sodium chloride    Intravenous Once   Chlorhexidine  Gluconate Cloth  6 each Topical Daily   docusate sodium   100 mg Oral BID   feeding supplement  237 mL Oral BID BM   folic acid   1 mg Oral Daily   multivitamin with minerals  1 tablet Oral Daily   pantoprazole   40 mg Oral Daily   thiamine   100 mg Oral Daily   Or   thiamine   100 mg Intravenous Daily   traZODone   25 mg Oral Once   Continuous Infusions:     LOS: 7 days    Time spent: 45 minutes spent on chart review, discussion with nursing staff, consultants, updating family and interview/physical exam; more than 50% of that time was spent in counseling and/or coordination of care.    Enrigue Harvard, DO [facility] Available via Epic secure chat 7am-7pm After these hours, please refer to coverage provider listed on [email or web address] 10/17/2023, 10:52 AM

## 2023-10-17 NOTE — TOC Progression Note (Signed)
 Transition of Care [facility]) - Progression Note    Patient Details  Name: Richard Andrews MRN: [id number] Date of Birth: 10-Aug-1960  Transition of Care [facility]) CM/SW Contact  Jonni Nettle, LCSW Phone Number: 10/17/2023, 8:45 AM  Clinical Narrative:    CSW received phone call from [place] at [facility] at 8:39 AM. Per Sallyanne Creamer, insurance authorization was been started, waiting for approval. Sallyanne Creamer reports she may come to hospital today to evaluate pt. TOC will continue to follow.   Expected Discharge Plan: Skilled Nursing Facility Barriers to Discharge: Continued Medical Work up  Expected Discharge Plan and Services In-house Referral: NA   Post Acute Care Choice: Skilled Nursing Facility Living arrangements for the past 2 months: Single Family Home                 DME Arranged: N/A DME Agency: NA       HH Arranged: NA HH Agency: NA         Social Determinants of Health (SDOH) Interventions SDOH Screenings   Food Insecurity: Food Insecurity Present (10/11/2023)  Housing: High Risk (10/11/2023)  Transportation Needs: No Transportation Needs (10/11/2023)  Recent Concern: Transportation Needs - Unmet Transportation Needs (09/26/2023)  Utilities: At Risk (10/11/2023)  Depression (PHQ2-9): Low Risk  (03/07/2023)  Social Connections: Unknown (08/01/2023)  Recent Concern: Social Connections - Socially Isolated (07/06/2023)  Tobacco Use: High Risk (10/12/2023)    Readmission Risk Interventions    10/12/2023    2:39 PM 07/11/2023    4:08 PM  Readmission Risk Prevention Plan  Transportation Screening Complete Complete  PCP or Specialist Appt within 3-5 Days  Complete  HRI or Home Care Consult  Complete  Social Work Consult for Recovery Care Planning/Counseling  Complete  Palliative Care Screening  Not Applicable  Medication Review Oceanographer) Complete Complete  PCP or Specialist appointment within 3-5 days of discharge Complete   HRI or Home Care Consult Complete   SW Recovery  Care/Counseling Consult Complete   Palliative Care Screening Not Applicable   Skilled Nursing Facility Complete    Le Primes, MSW, LCSW 10/17/2023 8:47 AM

## 2023-10-17 NOTE — Progress Notes (Signed)
 Occupational Therapy Treatment Patient Details Name: Richard Andrews MRN: [id number] DOB: 05-09-1961 Today's Date: 10/17/2023   History of present illness Richard Andrews is a 63 yr old male who presents 10/10/23 for evaluation of generalized weakness. Of note, pt admitted 4/15-4/24 for acute lower GI bleed and anemia and 4/25 patient visited the ED for alcohol  intoxication and right shoulder pain; CT head was showing chronic stable right subdural hematoma and no acute findings; X-ray of shoulder was negative for acute bony abnormality. Pt also noted to have a left femoral neck fracture during this hospital stay and is s/p a L THA on 10-12-23 (anterior approach). PMH: HTN, alcohol  use disorder with history of severe alcohol  withdrawal, cirrhosis, from last admission 4/15-4/24 patient found to have SDH, right Rib fractures 8,10,11, L1 anf T6 compression fractures.    OT comments  The pt was seen for progression of functional activity and instruction on performing a safe toilet transfer at bathroom level. He ambulated to and from the bathroom in his room using a RW. He required CGA to min assist overall. Unsteadiness noted with pt in standing. He also presented with grimacing and guarding of his L LE during progressive activity. Continue OT plan of care. Patient will benefit from continued inpatient follow up therapy, <3 hours/day.       If plan is discharge home, recommend the following:  A lot of help with bathing/dressing/bathroom;Assist for transportation;Assistance with cooking/housework;Help with stairs or ramp for entrance;A little help with walking and/or transfers   Equipment Recommendations  Other (comment) (defer to next level of care)    Recommendations for Other Services      Precautions / Restrictions Precautions Precautions: Fall Restrictions LLE Weight Bearing Per Provider Order: Weight bearing as tolerated       Mobility Bed Mobility Overal bed mobility: Needs Assistance Bed  Mobility: Supine to Sit, Sit to Supine     Supine to sit: Min assist, HOB elevated, Used rails Sit to supine: Min assist, HOB elevated, Used rails        Transfers Overall transfer level: Needs assistance Equipment used: Rolling walker (2 wheels) Transfers: Sit to/from Stand Sit to Stand: Min assist                 Balance     Sitting balance-Leahy Scale: Good         Standing balance comment: CGA to min assist with RW                ADL either performed or assessed with clinical judgement   ADL        Toilet Transfer: Regular Toilet;Ambulation;Grab bars;Contact guard assist;Minimal assistance Toilet Transfer Details (indicate cue type and reason): The pt was instructed on performing a safe toilet transfer at bathroom level. He ambulated to and from the bathroom using a RW, requiring consistent cues for step sequening. Once in the bathroom, he required instruction on walker placement, use of grab bar as needed, and LE positioning during transfer.                          Communication Communication Communication: No apparent difficulties   Cognition Arousal: Alert Behavior During Therapy: Flat affect               OT - Cognition Comments: Oriented to person, place, month, and year. Partly oriented to situation      Following commands impaired: Follows one step commands with increased time  Cueing   Cueing Techniques: Verbal cues             Pertinent Vitals/ Pain       Pain Assessment Pain Assessment: Faces Pain Score: 5  Pain Location: L hip with activity Pain Intervention(s): Monitored during session, Repositioned   Frequency  Min 2X/week        Progress Toward Goals  OT Goals(current goals can now be found in the care plan section)  Progress towards OT goals: Progressing toward goals  Acute Rehab OT Goals OT Goal Formulation: With patient Time For Goal Achievement: 10/27/23 Potential to Achieve Goals: Good   Plan         AM-PAC OT "6 Clicks" Daily Activity     Outcome Measure   Help from another person eating meals?: None Help from another person taking care of personal grooming?: A Little Help from another person toileting, which includes using toliet, bedpan, or urinal?: A Little Help from another person bathing (including washing, rinsing, drying)?: A Lot Help from another person to put on and taking off regular upper body clothing?: A Little Help from another person to put on and taking off regular lower body clothing?: A Lot 6 Click Score: 17    End of Session Equipment Utilized During Treatment: Rolling walker (2 wheels)  OT Visit Diagnosis: Muscle weakness (generalized) (M62.81);Other abnormalities of gait and mobility (R26.89);Unsteadiness on feet (R26.81);Pain Pain - Right/Left: Left Pain - part of body: Hip   Activity Tolerance Patient limited by pain   Patient Left in bed;with call bell/phone within reach;with bed alarm set   Nurse Communication Mobility status        Time: 1610-9604 OT Time Calculation (min): 18 min  Charges: OT General Charges $OT Visit: 1 Visit OT Treatments $Self Care/Home Management : 8-22 mins     Sheralyn Dies, OTR/L 10/17/2023, 5:39 PM

## 2023-10-18 DIAGNOSIS — F10931 Alcohol use, unspecified with withdrawal delirium: Secondary | ICD-10-CM | POA: Diagnosis not present

## 2023-10-18 LAB — CBC
HCT: 28.3 % — ABNORMAL LOW (ref 39.0–52.0)
Hemoglobin: 9 g/dL — ABNORMAL LOW (ref 13.0–17.0)
MCH: 31 pg (ref 26.0–34.0)
MCHC: 31.8 g/dL (ref 30.0–36.0)
MCV: 97.6 fL (ref 80.0–100.0)
Platelets: 139 10*3/uL — ABNORMAL LOW (ref 150–400)
RBC: 2.9 MIL/uL — ABNORMAL LOW (ref 4.22–5.81)
RDW: 16.4 % — ABNORMAL HIGH (ref 11.5–15.5)
WBC: 3.7 10*3/uL — ABNORMAL LOW (ref 4.0–10.5)
nRBC: 0 % (ref 0.0–0.2)

## 2023-10-18 LAB — BASIC METABOLIC PANEL WITH GFR
Anion gap: 9 (ref 5–15)
BUN: 10 mg/dL (ref 8–23)
CO2: 22 mmol/L (ref 22–32)
Calcium: 8.5 mg/dL — ABNORMAL LOW (ref 8.9–10.3)
Chloride: 98 mmol/L (ref 98–111)
Creatinine, Ser: 0.56 mg/dL — ABNORMAL LOW (ref 0.61–1.24)
GFR, Estimated: >60 mL/min (ref >60)
Glucose, Bld: 102 mg/dL — ABNORMAL HIGH (ref 70–99)
Potassium: 3.6 mmol/L (ref 3.5–5.1)
Sodium: 129 mmol/L — ABNORMAL LOW (ref 135–145)

## 2023-10-18 LAB — MAGNESIUM: Magnesium: 1.7 mg/dL (ref 1.7–2.4)

## 2023-10-18 MED ORDER — POLYETHYLENE GLYCOL 3350 17 G PO PACK: 17.0000 g | PACK | Freq: Every day | ORAL | Status: AC | PRN

## 2023-10-18 MED ORDER — DOCUSATE SODIUM 100 MG PO CAPS: 100.0000 mg | ORAL_CAPSULE | Freq: Two times a day (BID) | ORAL | Status: AC

## 2023-10-18 NOTE — TOC Progression Note (Signed)
 Transition of Care [facility]) - Progression Note    Patient Details  Name: Richard Andrews MRN: [id number] Date of Birth: Mar 24, 1961  Transition of Care [facility]) CM/SW Contact  Jonni Nettle, LCSW Phone Number: 10/18/2023, 9:19 AM  Clinical Narrative:    CSW spoke with Sallyanne Creamer at [facility] regarding insurance authorization. Per Sallyanne Creamer, pt's has out-of-state insurance. Insurance authorization still pending. TOC will continue to follow.   Expected Discharge Plan: Skilled Nursing Facility Barriers to Discharge: Continued Medical Work up  Expected Discharge Plan and Services In-house Referral: NA   Post Acute Care Choice: Skilled Nursing Facility Living arrangements for the past 2 months: Single Family Home                 DME Arranged: N/A DME Agency: NA       HH Arranged: NA HH Agency: NA         Social Determinants of Health (SDOH) Interventions SDOH Screenings   Food Insecurity: Food Insecurity Present (10/11/2023)  Housing: High Risk (10/11/2023)  Transportation Needs: No Transportation Needs (10/11/2023)  Recent Concern: Transportation Needs - Unmet Transportation Needs (09/26/2023)  Utilities: At Risk (10/11/2023)  Depression (PHQ2-9): Low Risk  (03/07/2023)  Social Connections: Unknown (08/01/2023)  Recent Concern: Social Connections - Socially Isolated (07/06/2023)  Tobacco Use: High Risk (10/12/2023)    Readmission Risk Interventions    10/12/2023    2:39 PM 07/11/2023    4:08 PM  Readmission Risk Prevention Plan  Transportation Screening Complete Complete  PCP or Specialist Appt within 3-5 Days  Complete  HRI or Home Care Consult  Complete  Social Work Consult for Recovery Care Planning/Counseling  Complete  Palliative Care Screening  Not Applicable  Medication Review Oceanographer) Complete Complete  PCP or Specialist appointment within 3-5 days of discharge Complete   HRI or Home Care Consult Complete   SW Recovery Care/Counseling Consult Complete   Palliative  Care Screening Not Applicable   Skilled Nursing Facility Complete

## 2023-10-18 NOTE — Progress Notes (Signed)
 Physical Therapy Treatment Patient Details Name: Richard Andrews MRN: [id number] DOB: 1961/06/04 Today's Date: 10/18/2023   History of Present Illness Richard Andrews is a 63 y.o. male presents 10/10/23 for evaluation of generalized weakness. Of note, pt admitted 4/15-4/24 for acute lower GI bleed and anemia and 4/25 patient visited the ED for alcohol  intoxication and right shoulder pain; CT head was showing chronic stable right subdural hematoma and no acute findings; X-ray of shoulder was negative for acute bony abnormality. PMH: HTN, alcohol  use disorder with history of severe alcohol  withdrawal, cirrhosis, from last admission 4/15-4/24 patient found to have SDH, right Rib fractures 8,10,11, L1 anf T6 compression fractures.    PT Comments  Pt received in bed, minimal encouragement needed to mobilize. Pt received pain meds ~1 hour prior to session. ModA to transfer to edge of bed after completing LE strengthening exercises. Pt continues to require ModA to transfer to standing at RW. Gait remains limited due to L Hip pain. Pt required CG/MinA to safely ambulate ~35ft with RW. Pt positioned to comfort in chair, all needs in reach.    If plan is discharge home, recommend the following: A lot of help with walking and/or transfers;A lot of help with bathing/dressing/bathroom;Assistance with cooking/housework;Assist for transportation;Help with stairs or ramp for entrance   Can travel by private vehicle     No  Equipment Recommendations  Other (comment) (TBD at next level of care)    Recommendations for Other Services       Precautions / Restrictions Precautions Precautions: Fall;Other (comment) (Direct Anterior Approach) Precaution Booklet Issued: No Recall of Precautions/Restrictions: Impaired Precaution/Restrictions Comments:  (No hip precautions) Restrictions Weight Bearing Restrictions Per Provider Order: Yes LLE Weight Bearing Per Provider Order: Weight bearing as tolerated     Mobility   Bed Mobility Overal bed mobility: Needs Assistance Bed Mobility: Supine to Sit     Supine to sit: Mod assist     General bed mobility comments: Assist for L LE. Increased timeand efortful for pt. Cues provided    Transfers Overall transfer level: Needs assistance Equipment used: Rolling walker (2 wheels) Transfers: Sit to/from Stand Sit to Stand: Mod assist           General transfer comment: Increased assistance needed due to c/o L hio pain.    Ambulation/Gait Ambulation/Gait assistance: Min assist Gait Distance (Feet): 35 Feet Assistive device: Rolling walker (2 wheels) Gait Pattern/deviations: Step-to pattern, Antalgic, Trunk flexed, Decreased stance time - left, Shuffle Gait velocity: decreased     General Gait Details: Mod cues for sequencing, technique, proper use of RW, RW proximity. Assist to stabilize pt throughout distance. Distance limited by pain. Very slow and effortful gait   Stairs             Wheelchair Mobility     Tilt Bed    Modified Rankin (Stroke Patients Only)       Balance Overall balance assessment: Needs assistance, History of Falls Sitting-balance support: Feet supported, Bilateral upper extremity supported Sitting balance-Leahy Scale: Good     Standing balance support: Bilateral upper extremity supported, During functional activity, Reliant on assistive device for balance Standing balance-Leahy Scale: Poor Standing balance comment: CGA to min assist with RW                            Communication Communication Communication: No apparent difficulties  Cognition Arousal: Alert Behavior During Therapy: Flat affect   PT - Cognitive  impairments: Memory, Safety/Judgement, Problem solving, Orientation, Awareness   Orientation impairments: Time, Situation                     Following commands: Impaired Following commands impaired: Follows one step commands with increased time    Cueing Cueing  Techniques: Verbal cues, Visual cues  Exercises General Exercises - Lower Extremity Ankle Circles/Pumps: AROM, Both, 10 reps, Supine Heel Slides: AAROM, Left, 10 reps Hip ABduction/ADduction: AAROM, Left, 10 reps    General Comments General comments (skin integrity, edema, etc.): L hip dressing intact, open sore on upper back covered with mepilex patch      Pertinent Vitals/Pain Pain Assessment Pain Assessment: Faces Faces Pain Scale: Hurts whole lot Pain Location: L hip with activity Pain Descriptors / Indicators: Aching, Discomfort, Grimacing, Operative site guarding Pain Intervention(s): Premedicated before session    Home Living                          Prior Function            PT Goals (current goals can now be found in the care plan section) Acute Rehab PT Goals Patient Stated Goal: "hip to stop hurting"    Frequency    Min 2X/week      PT Plan      Co-evaluation              AM-PAC PT "6 Clicks" Mobility   Outcome Measure  Help needed turning from your back to your side while in a flat bed without using bedrails?: A Lot Help needed moving from lying on your back to sitting on the side of a flat bed without using bedrails?: A Lot Help needed moving to and from a bed to a chair (including a wheelchair)?: A Little Help needed standing up from a chair using your arms (e.g., wheelchair or bedside chair)?: A Lot Help needed to walk in hospital room?: A Lot Help needed climbing 3-5 steps with a railing? : Total 6 Click Score: 12    End of Session Equipment Utilized During Treatment: Gait belt Activity Tolerance: Patient limited by pain Patient left: in chair;with call bell/phone within reach;with chair alarm set Nurse Communication: Mobility status PT Visit Diagnosis: Muscle weakness (generalized) (M62.81);Pain;Repeated falls (R29.6);Other abnormalities of gait and mobility (R26.89);Difficulty in walking, not elsewhere classified (R26.2) Pain  - Right/Left: Left Pain - part of body: Hip     Time: 4098-1191 PT Time Calculation (min) (ACUTE ONLY): 26 min  Charges:    $Gait Training: 8-22 mins $Therapeutic Exercise: 8-22 mins PT General Charges $$ ACUTE PT VISIT: 1 Visit                    Melvyn Stagers, PTA  Diona Franklin 10/18/2023, 3:50 PM

## 2023-10-18 NOTE — Plan of Care (Signed)

## 2023-10-18 NOTE — Plan of Care (Signed)

## 2023-10-18 NOTE — Progress Notes (Signed)
 PROGRESS NOTE    Richard Andrews  ZOX:[id number] DOB: Jul 18, 1960 DOA: 10/10/2023 PCP: Cherre Cornish, NP    Brief Narrative:  63 y.o. male with medical history significant of hypertension, alcohol  use disorder with history of severe alcohol  withdrawal, cirrhosis, traumatic subdural hematoma.  Patient was recently admitted to the hospital 4/15-4/24 for acute lower GI bleed and anemia.  GI was consulted and deferred endoscopic evaluation.  Lower GI bleed resolved spontaneously.  Outpatient hematology follow-up was recommended for pancytopenia.  Patient could not be discharged to SNF so he was discharged home with home health services.  The day after discharge 4/25 patient visited the ED for alcohol  intoxication and right shoulder pain.  CT head was showing chronic stable right subdural hematoma and no acute findings.  X-ray of shoulder was negative for acute bony abnormality.  Patient was discharged from the ED.   Patient presents to the ED today via EMS for evaluation of generalized weakness.  Patient was laying in his bed for the past 2 days as he was too weak to get up.   Found to have a left hip fracture.  Hip replacement  5/1.  During this hospitalization he was initially also intoxicated and had to be placed on CIWA protocol with Librium  taper.  Eventually no further signs of alcohol  withdrawal at this time.  Awaiting SNF placement   Assessment & Plan:  Principal Problem:   Alcohol  withdrawal (HCC) Active Problems:   Hypokalemia   Hypomagnesemia   Alcohol  use disorder   Elevated lactic acid level    Left hip fracture status post ORIF 5/1 -Postop management per orthopedic along with pain management, follow-up, surgical site care and therapy precautions -No DVT prophylaxis due to recent bleed   Alcohol  withdrawal/Alcohol  use disorder, severe, dependence -Treated with Librium  and alcohol  withdrawal protocol.   Elevated lactate Likely due to chronic alcohol  use and dehydration    Hypokalemia and hypomagnesemia Mild QT prolongation As needed repletion   Hypertension Does not take any medicines at home IV as needed   Cirrhosis Likely from alcohol  use no signs of hepatic encephalopathy and ascites at this time.   Chronic anemia -s/p 3 units prbc.  Hemoglobin stable around 9.0  PT/OT-SNF   DVT prophylaxis:SCD    Code Status: Full Code Family Communication:   Status is: Inpatient Discharge when bed available and insurance authorization   Subjective:  Seen at bedside no complaints.  Examination:  General exam: Appears calm and comfortable  Respiratory system: Clear to auscultation. Respiratory effort normal. Cardiovascular system: S1 & S2 heard, RRR. No JVD, murmurs, rubs, gallops or clicks. No pedal edema. Gastrointestinal system: Abdomen is nondistended, soft and nontender. No organomegaly or masses felt. Normal bowel sounds heard. Central nervous system: Alert and oriented. No focal neurological deficits. Extremities: Symmetric 5 x 5 power. Skin: No rashes, lesions or ulcers Psychiatry: Judgement and insight appear normal. Mood & affect appropriate.                Diet Orders (From admission, onward)     Start     Ordered   10/12/23 1531  Diet regular Room service appropriate? Yes; Fluid consistency: Thin  Diet effective now       Question Answer Comment  Room service appropriate? Yes   Fluid consistency: Thin      10/12/23 1530            Objective: Vitals:   10/17/23 1330 10/17/23 1942 10/18/23 0456 10/18/23 1356  BP: 113/77 107/65  113/79 (!) 126/92  Pulse: 85 78 72 75  Resp: 17 16 16 18   Temp: 97.8 F (36.6 C) 98.7 F (37.1 C) 98.6 F (37 C) 98.4 F (36.9 C)  TempSrc: Oral Oral Oral   SpO2: 100% 98% 98% 100%  Weight:      Height:        Intake/Output Summary (Last 24 hours) at 10/18/2023 1436 Last data filed at 10/18/2023 0900 Gross per 24 hour  Intake 286 ml  Output 1350 ml  Net -1064 ml   Filed Weights    10/11/23 1427 10/12/23 0901  Weight: 56.3 kg 56.3 kg    Scheduled Meds:  sodium chloride    Intravenous Once   Chlorhexidine  Gluconate Cloth  6 each Topical Daily   docusate sodium   100 mg Oral BID   feeding supplement  237 mL Oral BID BM   folic acid   1 mg Oral Daily   multivitamin with minerals  1 tablet Oral Daily   pantoprazole   40 mg Oral Daily   thiamine   100 mg Oral Daily   Or   thiamine   100 mg Intravenous Daily   traZODone   25 mg Oral Once   Continuous Infusions:  Nutritional status     Body mass index is 17.31 kg/m.  Data Reviewed:   CBC: Recent Labs  Lab 10/14/23 0826 10/15/23 0619 10/16/23 0943 10/17/23 0600 10/18/23 0546  WBC 4.2 5.4 3.9* 3.2* 3.7*  HGB 7.0* 8.5* 8.6* 8.5* 9.0*  HCT 21.9* 26.6* 27.2* 27.0* 28.3*  MCV 99.5 98.5 98.9 99.3 97.6  PLT 113* 117* 116* 122* 139*   Basic Metabolic Panel: Recent Labs  Lab 10/14/23 0826 10/15/23 0619 10/16/23 0943 10/17/23 0600 10/18/23 0546  NA 135 134* 134* 134* 129*  K 3.4* 3.7 3.3* 3.7 3.6  CL 102 102 101 103 98  CO2 24 25 26 24 22   GLUCOSE 110* 104* 102* 135* 102*  BUN 15 11 11 11 10   CREATININE 0.75 0.60* 0.71 0.49* 0.56*  CALCIUM  7.9* 8.5* 8.4* 8.5* 8.5*  MG  --   --   --  1.6* 1.7   GFR: Estimated Creatinine Clearance: 74.3 mL/min (A) (by C-G formula based on SCr of 0.56 mg/dL (L)). Liver Function Tests: No results for input(s): "AST", "ALT", "ALKPHOS", "BILITOT", "PROT", "ALBUMIN " in the last 168 hours. No results for input(s): "LIPASE", "AMYLASE" in the last 168 hours. No results for input(s): "AMMONIA" in the last 168 hours. Coagulation Profile: No results for input(s): "INR", "PROTIME" in the last 168 hours. Cardiac Enzymes: No results for input(s): "CKTOTAL", "CKMB", "CKMBINDEX", "TROPONINI" in the last 168 hours. BNP (last 3 results) No results for input(s): "PROBNP" in the last 8760 hours. HbA1C: No results for input(s): "HGBA1C" in the last 72 hours. CBG: No results for  input(s): "GLUCAP" in the last 168 hours. Lipid Profile: No results for input(s): "CHOL", "HDL", "LDLCALC", "TRIG", "CHOLHDL", "LDLDIRECT" in the last 72 hours. Thyroid Function Tests: No results for input(s): "TSH", "T4TOTAL", "FREET4", "T3FREE", "THYROIDAB" in the last 72 hours. Anemia Panel: No results for input(s): "VITAMINB12", "FOLATE", "FERRITIN", "TIBC", "IRON", "RETICCTPCT" in the last 72 hours. Sepsis Labs: Recent Labs  Lab 10/12/23 0313  LATICACIDVEN 1.3    Recent Results (from the past 240 hours)  MRSA Next Gen by PCR, Nasal     Status: None   Collection Time: 10/11/23  2:25 PM   Specimen: Nasal Mucosa; Nasal Swab  Result Value Ref Range Status   MRSA by PCR Next Gen NOT DETECTED  NOT DETECTED Final    Comment: (NOTE) The GeneXpert MRSA Assay (FDA approved for NASAL specimens only), is one component of a comprehensive MRSA colonization surveillance program. It is not intended to diagnose MRSA infection nor to guide or monitor treatment for MRSA infections. Test performance is not FDA approved in patients less than 18 years old. Performed at [facility], [address] [place], [name] [address]          Radiology Studies: No results found.         LOS: 8 days   Time spent= 35 mins    Maggie Schooner, MD [facility]  If 7PM-7AM, please contact night-coverage  10/18/2023, 2:36 PM

## 2023-10-18 NOTE — Hospital Course (Addendum)
 Brief Narrative:  63 y.o. male with medical history significant of hypertension, alcohol  use disorder with history of severe alcohol  withdrawal, cirrhosis, traumatic subdural hematoma.  Patient was recently admitted to the hospital 4/15-4/24 for acute lower GI bleed and anemia.  GI was consulted and deferred endoscopic evaluation.  Lower GI bleed resolved spontaneously.  Outpatient hematology follow-up was recommended for pancytopenia.  Patient could not be discharged to SNF so he was discharged home with home health services.  The day after discharge 4/25 patient visited the ED for alcohol  intoxication and right shoulder pain.  CT head was showing chronic stable right subdural hematoma and no acute findings.  X-ray of shoulder was negative for acute bony abnormality.  Patient was discharged from the ED.   Patient presents to the ED today via EMS for evaluation of generalized weakness.  Patient was laying in his bed for the past 2 days as he was too weak to get up.   Found to have a left hip fracture.  Hip replacement  5/1.  During this hospitalization he was initially also intoxicated and had to be placed on CIWA protocol with Librium  taper.  Eventually no further signs of alcohol  withdrawal at this time.  Awaiting SNF placement   Assessment & Plan:  Principal Problem:   Alcohol  withdrawal (HCC) Active Problems:   Hypokalemia   Hypomagnesemia   Alcohol  use disorder   Elevated lactic acid level    Left hip fracture status post ORIF 5/1 -Postop management per orthopedic along with pain management, follow-up, surgical site care and therapy precautions -No DVT prophylaxis due to recent bleed   Alcohol  withdrawal/Alcohol  use disorder, severe, dependence -Treated with Librium  and alcohol  withdrawal protocol.   Hypokalemia As needed repletion   Hypokalemia and hypomagnesemia Mild QT prolongation As needed repletion   Hypertension Does not take any medicines at home IV as needed    Cirrhosis Likely from alcohol  use no signs of hepatic encephalopathy and ascites at this time.   Chronic anemia -s/p 3 units prbc.  Hemoglobin stable around 9.0  PT/OT-SNF   DVT prophylaxis:SCD    Code Status: Full Code Family Communication:   Status is: Inpatient SNF today   Subjective:  Seen at bedside no complaints.   Examination:  General exam: Appears calm and comfortable  Respiratory system: Clear to auscultation. Respiratory effort normal. Cardiovascular system: S1 & S2 heard, RRR. No JVD, murmurs, rubs, gallops or clicks. No pedal edema. Gastrointestinal system: Abdomen is nondistended, soft and nontender. No organomegaly or masses felt. Normal bowel sounds heard. Central nervous system: Alert and oriented. No focal neurological deficits. Extremities: Symmetric 5 x 5 power. Skin: No rashes, lesions or ulcers Psychiatry: Judgement and insight appear normal. Mood & affect appropriate.

## 2023-10-19 DIAGNOSIS — R531 Weakness: Secondary | ICD-10-CM | POA: Diagnosis not present

## 2023-10-19 DIAGNOSIS — E871 Hypo-osmolality and hyponatremia: Secondary | ICD-10-CM | POA: Diagnosis not present

## 2023-10-19 DIAGNOSIS — F10939 Alcohol use, unspecified with withdrawal, unspecified: Secondary | ICD-10-CM | POA: Diagnosis not present

## 2023-10-19 MED ORDER — SODIUM CHLORIDE 0.9 % IV BOLUS
1000.0000 mL | Freq: Once | INTRAVENOUS | Status: AC
  Administered 2023-10-19: 1000 mL via INTRAVENOUS

## 2023-10-19 MED ORDER — MAGNESIUM SULFATE 2 GM/50ML IV SOLN
2.0000 g | Freq: Once | INTRAVENOUS | Status: AC
  Administered 2023-10-19: 2 g via INTRAVENOUS
  Filled 2023-10-19: qty 50

## 2023-10-19 NOTE — Plan of Care (Signed)

## 2023-10-19 NOTE — TOC Progression Note (Signed)
 Transition of Care [facility]) - Progression Note    Patient Details  Name: Richard Andrews MRN: [id number] Date of Birth: Oct 03, 1960  Transition of Care [facility]) CM/SW Contact  Jonni Nettle, LCSW Phone Number: 10/19/2023, 10:07 AM  Clinical Narrative:    CSW spoke with Sallyanne Creamer at [facility] regarding insurance authorization. Per Sallyanne Creamer, insurance authorization still pending. TOC will continue to follow.    Expected Discharge Plan: Skilled Nursing Facility Barriers to Discharge: Continued Medical Work up  Expected Discharge Plan and Services In-house Referral: NA   Post Acute Care Choice: Skilled Nursing Facility Living arrangements for the past 2 months: Single Family Home                 DME Arranged: N/A DME Agency: NA       HH Arranged: NA HH Agency: NA         Social Determinants of Health (SDOH) Interventions SDOH Screenings   Food Insecurity: Food Insecurity Present (10/11/2023)  Housing: High Risk (10/11/2023)  Transportation Needs: No Transportation Needs (10/11/2023)  Recent Concern: Transportation Needs - Unmet Transportation Needs (09/26/2023)  Utilities: At Risk (10/11/2023)  Depression (PHQ2-9): Low Risk  (03/07/2023)  Social Connections: Unknown (08/01/2023)  Recent Concern: Social Connections - Socially Isolated (07/06/2023)  Tobacco Use: High Risk (10/12/2023)    Readmission Risk Interventions    10/12/2023    2:39 PM 07/11/2023    4:08 PM  Readmission Risk Prevention Plan  Transportation Screening Complete Complete  PCP or Specialist Appt within 3-5 Days  Complete  HRI or Home Care Consult  Complete  Social Work Consult for Recovery Care Planning/Counseling  Complete  Palliative Care Screening  Not Applicable  Medication Review Oceanographer) Complete Complete  PCP or Specialist appointment within 3-5 days of discharge Complete   HRI or Home Care Consult Complete   SW Recovery Care/Counseling Consult Complete   Palliative Care Screening Not Applicable    Skilled Nursing Facility Complete

## 2023-10-19 NOTE — Progress Notes (Signed)
 PROGRESS NOTE    Richard Andrews  WGN:[id number] DOB: March 24, 1961 DOA: 10/10/2023 PCP: Cherre Cornish, NP    Brief Narrative:  63 y.o. male with medical history significant of hypertension, alcohol  use disorder with history of severe alcohol  withdrawal, cirrhosis, traumatic subdural hematoma.  Patient was recently admitted to the hospital 4/15-4/24 for acute lower GI bleed and anemia.  GI was consulted and deferred endoscopic evaluation.  Lower GI bleed resolved spontaneously.  Outpatient hematology follow-up was recommended for pancytopenia.  Patient could not be discharged to SNF so he was discharged home with home health services.  The day after discharge 4/25 patient visited the ED for alcohol  intoxication and right shoulder pain.  CT head was showing chronic stable right subdural hematoma and no acute findings.  X-ray of shoulder was negative for acute bony abnormality.  Patient was discharged from the ED.   Patient presents to the ED today via EMS for evaluation of generalized weakness.  Patient was laying in his bed for the past 2 days as he was too weak to get up.  Found to have a left hip fracture.  Hip replacement  5/1.  During this hospitalization he was initially also intoxicated and had to be placed on CIWA protocol with Librium  taper.  Eventually no further signs of alcohol  withdrawal at this time.  Awaiting SNF placement   Assessment & Plan:  Principal Problem:   Alcohol  withdrawal (HCC) Active Problems:   Hypokalemia   Hypomagnesemia   Alcohol  use disorder   Elevated lactic acid level    Left hip fracture status post ORIF 5/1 -Postop management per orthopedic along with pain management, follow-up, surgical site care and therapy precautions -No DVT prophylaxis due to recent bleed   Alcohol  withdrawal/Alcohol  use disorder, severe, dependence -Treated with Librium  and alcohol  withdrawal protocol.  Hyponatremia Sodium down from 134-129.  This is a chronic problem in the setting  of EtOH abuse. Patient also dry on exam. -Give IV NS 1 L bolus today -Follow-up morning BMP   Elevated lactate, resolved -Likely due to chronic alcohol  use and dehydration   Hypokalemia and hypomagnesemia Mild QT prolongation -K+ 3.6 with mag 1.7 today. -Give another IV mag 2 g x 1 today   Hypertension Does not take any medicines at home IV as needed   Cirrhosis Likely from alcohol  use no signs of hepatic encephalopathy and ascites at this time.   Chronic anemia -s/p 3 units prbc. Hemoglobin stable around 9.0  PT/OT-SNF, still pending insurance authorization   DVT prophylaxis:SCD    Code Status: Full Code Family Communication:   Status is: Inpatient Discharge when bed available and insurance authorization   Subjective:  Evaluated laying comfortably in bed. Has no acute complaints.  Informed him that we are still waiting for insurance authorization for SNF placement.  Examination:  General: Pleasant, well-appearing elderly man laying in bed. No acute distress. HEENT: [place]/AT. Anicteric sclera. Dry mucous membrane. CV: RRR. No murmurs, rubs, or gallops. No LE edema Pulmonary: Lungs CTAB. Normal effort. No wheezing or rales. Abdominal: Soft, nontender, nondistended. Normal bowel sounds. Extremities: Palpable radial and DP pulses. Normal ROM. Skin: Warm and dry. No obvious rash or lesions. Decreased skin turgor. Neuro: A&Ox3. Moves all extremities. Normal sensation to light touch. No focal deficit. Psych: Normal mood and affect                 Diet Orders (From admission, onward)     Start     Ordered   10/12/23  1531  Diet regular Room service appropriate? Yes; Fluid consistency: Thin  Diet effective now       Question Answer Comment  Room service appropriate? Yes   Fluid consistency: Thin      10/12/23 1530            Objective: Vitals:   10/18/23 0456 10/18/23 1356 10/18/23 2037 10/19/23 0526  BP: 113/79 (!) 126/92 116/73 107/71  Pulse: 72  75 82 76  Resp: 16 18 18 18   Temp: 98.6 F (37 C) 98.4 F (36.9 C) 99.5 F (37.5 C) 99.6 F (37.6 C)  TempSrc: Oral     SpO2: 98% 100% 100% 97%  Weight:      Height:        Intake/Output Summary (Last 24 hours) at 10/19/2023 0730 Last data filed at 10/19/2023 0865 Gross per 24 hour  Intake 236 ml  Output 1400 ml  Net -1164 ml   Filed Weights   10/11/23 1427 10/12/23 0901  Weight: 56.3 kg 56.3 kg    Scheduled Meds:  sodium chloride    Intravenous Once   Chlorhexidine  Gluconate Cloth  6 each Topical Daily   docusate sodium   100 mg Oral BID   feeding supplement  237 mL Oral BID BM   folic acid   1 mg Oral Daily   multivitamin with minerals  1 tablet Oral Daily   pantoprazole   40 mg Oral Daily   thiamine   100 mg Oral Daily   Or   thiamine   100 mg Intravenous Daily   traZODone   25 mg Oral Once   Continuous Infusions:  Nutritional status     Body mass index is 17.31 kg/m.  Data Reviewed:   CBC: Recent Labs  Lab 10/14/23 0826 10/15/23 0619 10/16/23 0943 10/17/23 0600 10/18/23 0546  WBC 4.2 5.4 3.9* 3.2* 3.7*  HGB 7.0* 8.5* 8.6* 8.5* 9.0*  HCT 21.9* 26.6* 27.2* 27.0* 28.3*  MCV 99.5 98.5 98.9 99.3 97.6  PLT 113* 117* 116* 122* 139*   Basic Metabolic Panel: Recent Labs  Lab 10/14/23 0826 10/15/23 0619 10/16/23 0943 10/17/23 0600 10/18/23 0546  NA 135 134* 134* 134* 129*  K 3.4* 3.7 3.3* 3.7 3.6  CL 102 102 101 103 98  CO2 24 25 26 24 22   GLUCOSE 110* 104* 102* 135* 102*  BUN 15 11 11 11 10   CREATININE 0.75 0.60* 0.71 0.49* 0.56*  CALCIUM  7.9* 8.5* 8.4* 8.5* 8.5*  MG  --   --   --  1.6* 1.7   GFR: Estimated Creatinine Clearance: 74.3 mL/min (A) (by C-G formula based on SCr of 0.56 mg/dL (L)). Liver Function Tests: No results for input(s): "AST", "ALT", "ALKPHOS", "BILITOT", "PROT", "ALBUMIN " in the last 168 hours. No results for input(s): "LIPASE", "AMYLASE" in the last 168 hours. No results for input(s): "AMMONIA" in the last 168  hours. Coagulation Profile: No results for input(s): "INR", "PROTIME" in the last 168 hours. Cardiac Enzymes: No results for input(s): "CKTOTAL", "CKMB", "CKMBINDEX", "TROPONINI" in the last 168 hours. BNP (last 3 results) No results for input(s): "PROBNP" in the last 8760 hours. HbA1C: No results for input(s): "HGBA1C" in the last 72 hours. CBG: No results for input(s): "GLUCAP" in the last 168 hours. Lipid Profile: No results for input(s): "CHOL", "HDL", "LDLCALC", "TRIG", "CHOLHDL", "LDLDIRECT" in the last 72 hours. Thyroid Function Tests: No results for input(s): "TSH", "T4TOTAL", "FREET4", "T3FREE", "THYROIDAB" in the last 72 hours. Anemia Panel: No results for input(s): "VITAMINB12", "FOLATE", "FERRITIN", "TIBC", "IRON", "RETICCTPCT" in  the last 72 hours. Sepsis Labs: No results for input(s): "PROCALCITON", "LATICACIDVEN" in the last 168 hours.   Recent Results (from the past 240 hours)  MRSA Next Gen by PCR, Nasal     Status: None   Collection Time: 10/11/23  2:25 PM   Specimen: Nasal Mucosa; Nasal Swab  Result Value Ref Range Status   MRSA by PCR Next Gen NOT DETECTED NOT DETECTED Final    Comment: (NOTE) The GeneXpert MRSA Assay (FDA approved for NASAL specimens only), is one component of a comprehensive MRSA colonization surveillance program. It is not intended to diagnose MRSA infection nor to guide or monitor treatment for MRSA infections. Test performance is not FDA approved in patients less than 72 years old. Performed at [facility], [address], [place], [name] [address]          Radiology Studies: No results found.         LOS: 9 days   Time spent= 35 mins    Vita Grip, MD [facility]  If 7PM-7AM, please contact night-coverage  10/19/2023, 7:30 AM

## 2023-10-19 NOTE — Progress Notes (Signed)
 Occupational Therapy Treatment Patient Details Name: Richard Andrews MRN: [id number] DOB: Mar 01, 1961 Today's Date: 10/19/2023   History of present illness Richard Andrews is a 63 y.o. male presents 10/10/23 for evaluation of generalized weakness. Of note, pt admitted 4/15-4/24 for acute lower GI bleed and anemia and 4/25 patient visited the ED for alcohol  intoxication and right shoulder pain; CT head was showing chronic stable right subdural hematoma and no acute findings; X-ray of shoulder was negative for acute bony abnormality. PMH: HTN, alcohol  use disorder with history of severe alcohol  withdrawal, cirrhosis, from last admission 4/15-4/24 patient found to have SDH, right Rib fractures 8,10,11, L1 anf T6 compression fractures.   OT comments  Patient with fair progress toward patient focused goals.  Moving a little better today, but continues to be limited by L hip pain.  Essentially Mod A for basic mobility and closer to Max A for ADL completion.  OT will continue efforts in the acute setting to address deficits and Patient will benefit from continued inpatient follow up therapy, <3 hours/day.      If plan is discharge home, recommend the following:  A lot of help with bathing/dressing/bathroom;Assist for transportation;Assistance with cooking/housework;Help with stairs or ramp for entrance;A little help with walking and/or transfers   Equipment Recommendations       Recommendations for Other Services      Precautions / Restrictions Precautions Precautions: Fall Recall of Precautions/Restrictions: Impaired Restrictions Weight Bearing Restrictions Per Provider Order: Yes LLE Weight Bearing Per Provider Order: Weight bearing as tolerated       Mobility Bed Mobility Overal bed mobility: Needs Assistance       Supine to sit: Mod assist          Transfers Overall transfer level: Needs assistance Equipment used: Rolling walker (2 wheels) Transfers: Sit to/from Stand, Bed to  chair/wheelchair/BSC Sit to Stand: Mod assist     Step pivot transfers: Mod assist           Balance Overall balance assessment: Needs assistance, History of Falls Sitting-balance support: Feet supported, Bilateral upper extremity supported Sitting balance-Leahy Scale: Good     Standing balance support: Reliant on assistive device for balance Standing balance-Leahy Scale: Poor                             ADL either performed or assessed with clinical judgement   ADL                       Lower Body Dressing: Moderate assistance;Sitting/lateral leans;Sit to/from stand   Toilet Transfer: Regular Toilet;Ambulation;Grab bars;Minimal assistance                  Extremity/Trunk Assessment Upper Extremity Assessment Upper Extremity Assessment: Generalized weakness   Lower Extremity Assessment Lower Extremity Assessment: Defer to PT evaluation        Vision Patient Visual Report: No change from baseline     Perception Perception Perception: Not tested   Praxis Praxis Praxis: Not tested   Communication Communication Communication: No apparent difficulties Factors Affecting Communication: Reduced clarity of speech   Cognition Arousal: Alert Behavior During Therapy: Flat affect Cognition: No family/caregiver present to determine baseline, Cognition impaired       Memory impairment (select all impairments): Short-term memory Attention impairment (select first level of impairment): Sustained attention  Following commands: Impaired Following commands impaired: Follows one step commands with increased time      Cueing   Cueing Techniques: Verbal cues  Exercises      Shoulder Instructions       General Comments      Pertinent Vitals/ Pain       Pain Assessment Pain Assessment: Faces Faces Pain Scale: Hurts even more Pain Location: L hip with activity Pain Descriptors / Indicators: Aching,  Discomfort, Grimacing, Operative site guarding Pain Intervention(s): Monitored during session                                                          Frequency  Min 2X/week        Progress Toward Goals  OT Goals(current goals can now be found in the care plan section)  Progress towards OT goals: Progressing toward goals  Acute Rehab OT Goals OT Goal Formulation: With patient Time For Goal Achievement: 10/27/23 Potential to Achieve Goals: Good  Plan      Co-evaluation                 AM-PAC OT "6 Clicks" Daily Activity     Outcome Measure   Help from another person eating meals?: None Help from another person taking care of personal grooming?: A Little Help from another person toileting, which includes using toliet, bedpan, or urinal?: A Little Help from another person bathing (including washing, rinsing, drying)?: A Lot Help from another person to put on and taking off regular upper body clothing?: A Little Help from another person to put on and taking off regular lower body clothing?: A Lot 6 Click Score: 17    End of Session Equipment Utilized During Treatment: Rolling walker (2 wheels)  OT Visit Diagnosis: Muscle weakness (generalized) (M62.81);Other abnormalities of gait and mobility (R26.89);Unsteadiness on feet (R26.81);Pain Pain - Right/Left: Left Pain - part of body: Hip   Activity Tolerance Patient tolerated treatment well   Patient Left in chair;with call bell/phone within reach;with chair alarm set   Nurse Communication Mobility status        Time: 2130-8657 OT Time Calculation (min): 20 min  Charges: OT General Charges $OT Visit: 1 Visit OT Treatments $Self Care/Home Management : 8-22 mins  10/19/2023  RP, OTR/L  Acute Rehabilitation Services  Office:  [phone]   Richard Andrews 10/19/2023, 1:14 PM

## 2023-10-20 DIAGNOSIS — F10930 Alcohol use, unspecified with withdrawal, uncomplicated: Secondary | ICD-10-CM | POA: Diagnosis not present

## 2023-10-20 LAB — CBC
HCT: 26.7 % — ABNORMAL LOW (ref 39.0–52.0)
Hemoglobin: 8.4 g/dL — ABNORMAL LOW (ref 13.0–17.0)
MCH: 31.6 pg (ref 26.0–34.0)
MCHC: 31.5 g/dL (ref 30.0–36.0)
MCV: 100.4 fL — ABNORMAL HIGH (ref 80.0–100.0)
Platelets: 155 10*3/uL (ref 150–400)
RBC: 2.66 MIL/uL — ABNORMAL LOW (ref 4.22–5.81)
RDW: 16.3 % — ABNORMAL HIGH (ref 11.5–15.5)
WBC: 3.3 10*3/uL — ABNORMAL LOW (ref 4.0–10.5)
nRBC: 0 % (ref 0.0–0.2)

## 2023-10-20 LAB — BASIC METABOLIC PANEL WITH GFR
Anion gap: 10 (ref 5–15)
BUN: 13 mg/dL (ref 8–23)
CO2: 22 mmol/L (ref 22–32)
Calcium: 8.4 mg/dL — ABNORMAL LOW (ref 8.9–10.3)
Chloride: 104 mmol/L (ref 98–111)
Creatinine, Ser: 0.67 mg/dL (ref 0.61–1.24)
GFR, Estimated: >60 mL/min (ref >60)
Glucose, Bld: 128 mg/dL — ABNORMAL HIGH (ref 70–99)
Potassium: 3.3 mmol/L — ABNORMAL LOW (ref 3.5–5.1)
Sodium: 136 mmol/L (ref 135–145)

## 2023-10-20 LAB — PHOSPHORUS: Phosphorus: 3.8 mg/dL (ref 2.5–4.6)

## 2023-10-20 LAB — MAGNESIUM: Magnesium: 1.8 mg/dL (ref 1.7–2.4)

## 2023-10-20 MED ORDER — POTASSIUM CHLORIDE CRYS ER 20 MEQ PO TBCR
40.0000 meq | EXTENDED_RELEASE_TABLET | Freq: Once | ORAL | Status: AC
  Administered 2023-10-20: 40 meq via ORAL
  Filled 2023-10-20: qty 4

## 2023-10-20 NOTE — TOC Transition Note (Signed)
 Transition of Care [facility]) - Discharge Note   Patient Details  Name: Richard Andrews MRN: [id number] Date of Birth: Sep 17, 1960  Transition of Care [facility]) CM/SW Contact:  Jonni Nettle, LCSW Phone Number: 10/20/2023, 3:25 PM   Clinical Narrative:    Pt to transfer to [facility] for short-term rehab today. Pt to be admitted to room 310. D/C packet with signed prescription put in chart at RN station. RN to call report to [phone], ext. [phone]. CSW left voicemail with APS case worker to inform of discharge. Pt inquired about receiving clothes prior to discharge. Pt reports he has 1 shirt and 1 pair of pants. CSW advised that hospital cannot provide clothing due to pt already having a pair of clothes. Pt is agreeable with discharge plan. No further TOC needs at this time.   Final next level of care: Home w Home Health Services Barriers to Discharge: Continued Medical Work up   Patient Goals and CMS Choice Patient states their goals for this hospitalization and ongoing recovery are:: return home CMS Medicare.gov Compare Post Acute Care list provided to:: Patient Choice offered to / list presented to : Patient [place] ownership interest in [facility].provided to:: Patient    Discharge Placement Antonia Battiest, for short-term rehabilitation  Discharge Plan and Services Additional resources added to the After Visit Summary for N/A In-house Referral: NA   Post Acute Care Choice: Skilled Nursing Facility          DME Arranged: N/A DME Agency: NA  HH Arranged: NA HH Agency: NA  Social Drivers of Health (SDOH) Interventions SDOH Screenings   Food Insecurity: Food Insecurity Present (10/11/2023)  Housing: High Risk (10/11/2023)  Transportation Needs: No Transportation Needs (10/11/2023)  Recent Concern: Transportation Needs - Unmet Transportation Needs (09/26/2023)  Utilities: At Risk (10/11/2023)  Depression (PHQ2-9): Low Risk  (03/07/2023)  Social Connections: Unknown (08/01/2023)   Recent Concern: Social Connections - Socially Isolated (07/06/2023)  Tobacco Use: High Risk (10/12/2023)    Readmission Risk Interventions    10/12/2023    2:39 PM 07/11/2023    4:08 PM  Readmission Risk Prevention Plan  Transportation Screening Complete Complete  PCP or Specialist Appt within 3-5 Days  Complete  HRI or Home Care Consult  Complete  Social Work Consult for Recovery Care Planning/Counseling  Complete  Palliative Care Screening  Not Applicable  Medication Review Oceanographer) Complete Complete  PCP or Specialist appointment within 3-5 days of discharge Complete   HRI or Home Care Consult Complete   SW Recovery Care/Counseling Consult Complete   Palliative Care Screening Not Applicable   Skilled Nursing Facility Complete     [place], MSW, LCSW 10/20/2023 3:33 PM

## 2023-10-20 NOTE — Progress Notes (Signed)
 Physical Therapy Treatment Patient Details Name: Richard Andrews MRN: [id number] DOB: 21-Jun-1960 Today's Date: 10/20/2023   History of Present Illness Richard Andrews is a 63 y.o. male presents 10/10/23 for evaluation of generalized weakness. Of note, pt admitted 4/15-4/24 for acute lower GI bleed and anemia and 4/25 patient visited the ED for alcohol  intoxication and right shoulder pain; CT head was showing chronic stable right subdural hematoma and no acute findings; X-ray of shoulder was negative for acute bony abnormality. PMH: HTN, alcohol  use disorder with history of severe alcohol  withdrawal, cirrhosis, from last admission 4/15-4/24 patient found to have SDH, right Rib fractures 8,10,11, L1 anf T6 compression fractures.    PT Comments  POD # 8 PT - Cognition Comments: AxO x 2 pleasant and following commands.  Poor ST memory.  He thinks he broke his hip "six weeks ago I guess".  Poor medical self knowledge. Pt was living home alone.  Assisted OOB to amb to bathroom was difficult.  General bed mobility comments: Assist for L LE. Increased time and increased assist to complete scooting to EOB General transfer comment: 50% VC's on proper hand placement and 75% VC's on safety with turns/completion/targeting.  Also assisted with a toilet transfer.  Required increased asisst from lower toilet height.  Assisted with peri care due to impaired balance. General Gait Details: Mod cues for sequencing, technique, proper use of RW, RW proximity. Assist to stabilize pt throughout distance. Distance limited by pain. Very slow gait with poor forward flexed posture.  B hips and knees flexed.  HIGH FALL RISK.  Assisted back to bed required + 2 to scoot to [facility]. LPT has rec Pt will need ST Rehab at SNF to address mobility and functional decline prior to safely returning home.    If plan is discharge home, recommend the following:     Can travel by private vehicle     No  Equipment Recommendations  None recommended by  PT    Recommendations for Other Services       Precautions / Restrictions Precautions Precautions: Fall Precaution Booklet Issued: No Recall of Precautions/Restrictions: Impaired Restrictions Weight Bearing Restrictions Per Provider Order: No LLE Weight Bearing Per Provider Order: Weight bearing as tolerated     Mobility  Bed Mobility Overal bed mobility: Needs Assistance Bed Mobility: Supine to Sit           General bed mobility comments: Assist for L LE. Increased time and increased assist to complete scooting to EOB.  Assisted back to bed required + 2 to scoot to [facility].    Transfers Overall transfer level: Needs assistance Equipment used: Rolling walker (2 wheels) Transfers: Sit to/from Stand             General transfer comment: 50% VC's on proper hand placement and 75% VC's on safety with turns/completion/targeting.  Also assisted with a toilet transfer.  Required increased asisst from lower toilet height.  Assisted with peri care due to impaired balance.    Ambulation/Gait Ambulation/Gait assistance: Min assist, Mod assist Gait Distance (Feet): 24 Feet (12 feet x 2) Assistive device: Rolling walker (2 wheels) Gait Pattern/deviations: Step-to pattern, Antalgic, Trunk flexed, Decreased stance time - left, Shuffle Gait velocity: decreased     General Gait Details: Mod cues for sequencing, technique, proper use of RW, RW proximity. Assist to stabilize pt throughout distance. Distance limited by pain. Very slow gait with poor forward flexed posture.  B hips and knees flexed.  HIGH FALL RISK.  Stairs             Wheelchair Mobility     Tilt Bed    Modified Rankin (Stroke Patients Only)       Balance                                            Communication Communication Communication: No apparent difficulties Factors Affecting Communication: Reduced clarity of speech  Cognition Arousal: Alert Behavior During Therapy: Flat  affect   PT - Cognitive impairments: Memory, Safety/Judgement, Problem solving, Orientation, Awareness                       PT - Cognition Comments: AxO x 2 pleasant and following commands.  Poor ST memory.  He thinks he broke his hip "six weeks ago I guess".  Poor medical self knowledge. Following commands: Impaired Following commands impaired: Follows one step commands with increased time    Cueing Cueing Techniques: Verbal cues  Exercises      General Comments        Pertinent Vitals/Pain Pain Assessment Pain Assessment: Faces Faces Pain Scale: Hurts a little bit Pain Location: L hip with activity Pain Descriptors / Indicators: Aching, Discomfort, Grimacing, Operative site guarding Pain Intervention(s): Monitored during session, Repositioned    Home Living                          Prior Function            PT Goals (current goals can now be found in the care plan section) Progress towards PT goals: Progressing toward goals    Frequency    Min 2X/week      PT Plan      Co-evaluation              AM-PAC PT "6 Clicks" Mobility   Outcome Measure  Help needed turning from your back to your side while in a flat bed without using bedrails?: A Lot Help needed moving from lying on your back to sitting on the side of a flat bed without using bedrails?: A Lot Help needed moving to and from a bed to a chair (including a wheelchair)?: A Lot Help needed standing up from a chair using your arms (e.g., wheelchair or bedside chair)?: A Lot Help needed to walk in hospital room?: A Lot Help needed climbing 3-5 steps with a railing? : Total 6 Click Score: 11    End of Session Equipment Utilized During Treatment: Gait belt Activity Tolerance: Patient limited by fatigue Patient left: in bed;with call bell/phone within reach;with bed alarm set Nurse Communication: Mobility status PT Visit Diagnosis: Muscle weakness (generalized)  (M62.81);Pain;Repeated falls (R29.6);Other abnormalities of gait and mobility (R26.89);Difficulty in walking, not elsewhere classified (R26.2) Pain - Right/Left: Left Pain - part of body: Hip     Time: 1040-1105 PT Time Calculation (min) (ACUTE ONLY): 25 min  Charges:    $Gait Training: 8-22 mins $Therapeutic Activity: 8-22 mins PT General Charges $$ ACUTE PT VISIT: 1 Visit                     Bess Broody  PTA Acute  Rehabilitation Services Office M-F          [phone]

## 2023-10-20 NOTE — Progress Notes (Signed)
 Attempted to call report to SNF 2x. No answer.
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
## 2023-10-20 NOTE — Progress Notes (Signed)
 PROGRESS NOTE    Richard Andrews  ZOX:[id number] DOB: 04-25-1961 DOA: 10/10/2023 PCP: Cherre Cornish, NP    Brief Narrative:  63 y.o. male with medical history significant of hypertension, alcohol  use disorder with history of severe alcohol  withdrawal, cirrhosis, traumatic subdural hematoma.  Patient was recently admitted to the hospital 4/15-4/24 for acute lower GI bleed and anemia.  GI was consulted and deferred endoscopic evaluation.  Lower GI bleed resolved spontaneously.  Outpatient hematology follow-up was recommended for pancytopenia.  Patient could not be discharged to SNF so he was discharged home with home health services.  The day after discharge 4/25 patient visited the ED for alcohol  intoxication and right shoulder pain.  CT head was showing chronic stable right subdural hematoma and no acute findings.  X-ray of shoulder was negative for acute bony abnormality.  Patient was discharged from the ED.   Patient presents to the ED today via EMS for evaluation of generalized weakness.  Patient was laying in his bed for the past 2 days as he was too weak to get up.   Found to have a left hip fracture.  Hip replacement  5/1.  During this hospitalization he was initially also intoxicated and had to be placed on CIWA protocol with Librium  taper.  Eventually no further signs of alcohol  withdrawal at this time.  Awaiting SNF placement   Assessment & Plan:  Principal Problem:   Alcohol  withdrawal (HCC) Active Problems:   Hypokalemia   Hypomagnesemia   Alcohol  use disorder   Elevated lactic acid level    Left hip fracture status post ORIF 5/1 -Postop management per orthopedic along with pain management, follow-up, surgical site care and therapy precautions -No DVT prophylaxis due to recent bleed   Alcohol  withdrawal/Alcohol  use disorder, severe, dependence -Treated with Librium  and alcohol  withdrawal protocol.   Hypokalemia As needed repletion   Hypokalemia and hypomagnesemia Mild QT  prolongation As needed repletion   Hypertension Does not take any medicines at home IV as needed   Cirrhosis Likely from alcohol  use no signs of hepatic encephalopathy and ascites at this time.   Chronic anemia -s/p 3 units prbc.  Hemoglobin stable around 9.0  PT/OT-SNF   DVT prophylaxis:SCD    Code Status: Full Code Family Communication:   Status is: Inpatient Discharge when bed available and insurance authorization   Subjective:  Seen at bedside no complaints. Waiting for placement.   Examination:  General exam: Appears calm and comfortable  Respiratory system: Clear to auscultation. Respiratory effort normal. Cardiovascular system: S1 & S2 heard, RRR. No JVD, murmurs, rubs, gallops or clicks. No pedal edema. Gastrointestinal system: Abdomen is nondistended, soft and nontender. No organomegaly or masses felt. Normal bowel sounds heard. Central nervous system: Alert and oriented. No focal neurological deficits. Extremities: Symmetric 5 x 5 power. Skin: No rashes, lesions or ulcers Psychiatry: Judgement and insight appear normal. Mood & affect appropriate.                Diet Orders (From admission, onward)     Start     Ordered   10/12/23 1531  Diet regular Room service appropriate? Yes; Fluid consistency: Thin  Diet effective now       Question Answer Comment  Room service appropriate? Yes   Fluid consistency: Thin      10/12/23 1530            Objective: Vitals:   10/19/23 1348 10/19/23 1936 10/20/23 0408 10/20/23 1153  BP: 106/68 102/67 102/60 104/67  Pulse: 65 73 71 75  Resp: 15 15 15    Temp: 97.9 F (36.6 C) 98.9 F (37.2 C) 98.3 F (36.8 C) 98.6 F (37 C)  TempSrc: Oral Oral  Oral  SpO2: 100% 95% 98% 98%  Weight:      Height:        Intake/Output Summary (Last 24 hours) at 10/20/2023 1229 Last data filed at 10/20/2023 0900 Gross per 24 hour  Intake 620 ml  Output 1600 ml  Net -980 ml   Filed Weights   10/11/23 1427  10/12/23 0901  Weight: 56.3 kg 56.3 kg    Scheduled Meds:  Chlorhexidine  Gluconate Cloth  6 each Topical Daily   docusate sodium   100 mg Oral BID   feeding supplement  237 mL Oral BID BM   folic acid   1 mg Oral Daily   multivitamin with minerals  1 tablet Oral Daily   pantoprazole   40 mg Oral Daily   thiamine   100 mg Oral Daily   Or   thiamine   100 mg Intravenous Daily   Continuous Infusions:  Nutritional status     Body mass index is 17.31 kg/m.  Data Reviewed:   CBC: Recent Labs  Lab 10/15/23 0619 10/16/23 0943 10/17/23 0600 10/18/23 0546 10/20/23 0536  WBC 5.4 3.9* 3.2* 3.7* 3.3*  HGB 8.5* 8.6* 8.5* 9.0* 8.4*  HCT 26.6* 27.2* 27.0* 28.3* 26.7*  MCV 98.5 98.9 99.3 97.6 100.4*  PLT 117* 116* 122* 139* 155   Basic Metabolic Panel: Recent Labs  Lab 10/15/23 0619 10/16/23 0943 10/17/23 0600 10/18/23 0546 10/20/23 0536  NA 134* 134* 134* 129* 136  K 3.7 3.3* 3.7 3.6 3.3*  CL 102 101 103 98 104  CO2 25 26 24 22 22   GLUCOSE 104* 102* 135* 102* 128*  BUN 11 11 11 10 13   CREATININE 0.60* 0.71 0.49* 0.56* 0.67  CALCIUM  8.5* 8.4* 8.5* 8.5* 8.4*  MG  --   --  1.6* 1.7 1.8  PHOS  --   --   --   --  3.8   GFR: Estimated Creatinine Clearance: 74.3 mL/min (by C-G formula based on SCr of 0.67 mg/dL). Liver Function Tests: No results for input(s): "AST", "ALT", "ALKPHOS", "BILITOT", "PROT", "ALBUMIN " in the last 168 hours. No results for input(s): "LIPASE", "AMYLASE" in the last 168 hours. No results for input(s): "AMMONIA" in the last 168 hours. Coagulation Profile: No results for input(s): "INR", "PROTIME" in the last 168 hours. Cardiac Enzymes: No results for input(s): "CKTOTAL", "CKMB", "CKMBINDEX", "TROPONINI" in the last 168 hours. BNP (last 3 results) No results for input(s): "PROBNP" in the last 8760 hours. HbA1C: No results for input(s): "HGBA1C" in the last 72 hours. CBG: No results for input(s): "GLUCAP" in the last 168 hours. Lipid Profile: No  results for input(s): "CHOL", "HDL", "LDLCALC", "TRIG", "CHOLHDL", "LDLDIRECT" in the last 72 hours. Thyroid Function Tests: No results for input(s): "TSH", "T4TOTAL", "FREET4", "T3FREE", "THYROIDAB" in the last 72 hours. Anemia Panel: No results for input(s): "VITAMINB12", "FOLATE", "FERRITIN", "TIBC", "IRON", "RETICCTPCT" in the last 72 hours. Sepsis Labs: No results for input(s): "PROCALCITON", "LATICACIDVEN" in the last 168 hours.  Recent Results (from the past 240 hours)  MRSA Next Gen by PCR, Nasal     Status: None   Collection Time: 10/11/23  2:25 PM   Specimen: Nasal Mucosa; Nasal Swab  Result Value Ref Range Status   MRSA by PCR Next Gen NOT DETECTED NOT DETECTED Final    Comment: (NOTE) The  GeneXpert MRSA Assay (FDA approved for NASAL specimens only), is one component of a comprehensive MRSA colonization surveillance program. It is not intended to diagnose MRSA infection nor to guide or monitor treatment for MRSA infections. Test performance is not FDA approved in patients less than 24 years old. Performed at [facility], [place], [address] [place], [name] [address]          Radiology Studies: No results found.         LOS: 10 days   Time spent= 35 mins    Maggie Schooner, MD [facility]  If 7PM-7AM, please contact night-coverage  10/20/2023, 12:29 PM

## 2023-10-20 NOTE — Plan of Care (Signed)

## 2023-12-04 ENCOUNTER — Telehealth: Payer: Self-pay

## 2023-12-04 NOTE — Telephone Encounter (Signed)
 Left voicemail to confirm appts for 6/24

## 2023-12-05 ENCOUNTER — Inpatient Hospital Stay

## 2023-12-05 ENCOUNTER — Inpatient Hospital Stay: Attending: Hematology and Oncology | Admitting: Hematology and Oncology

## 2023-12-05 VITALS — BP 112/75 | HR 63 | Temp 98.7°F | Resp 16 | Ht 71.0 in | Wt 124.9 lb

## 2023-12-05 DIAGNOSIS — F1011 Alcohol abuse, in remission: Secondary | ICD-10-CM | POA: Insufficient documentation

## 2023-12-05 DIAGNOSIS — K7030 Alcoholic cirrhosis of liver without ascites: Secondary | ICD-10-CM | POA: Insufficient documentation

## 2023-12-05 DIAGNOSIS — D61818 Other pancytopenia: Secondary | ICD-10-CM | POA: Diagnosis present

## 2023-12-05 LAB — CBC WITH DIFFERENTIAL/PLATELET
Abs Immature Granulocytes: 0.01 10*3/uL (ref 0.00–0.07)
Basophils Absolute: 0 10*3/uL (ref 0.0–0.1)
Basophils Relative: 0 %
Eosinophils Absolute: 0.2 10*3/uL (ref 0.0–0.5)
Eosinophils Relative: 5 %
HCT: 36.4 % — ABNORMAL LOW (ref 39.0–52.0)
Hemoglobin: 11.9 g/dL — ABNORMAL LOW (ref 13.0–17.0)
Immature Granulocytes: 0 %
Lymphocytes Relative: 38 %
Lymphs Abs: 1.2 10*3/uL (ref 0.7–4.0)
MCH: 31 pg (ref 26.0–34.0)
MCHC: 32.7 g/dL (ref 30.0–36.0)
MCV: 94.8 fL (ref 80.0–100.0)
Monocytes Absolute: 0.4 10*3/uL (ref 0.1–1.0)
Monocytes Relative: 13 %
Neutro Abs: 1.4 10*3/uL — ABNORMAL LOW (ref 1.7–7.7)
Neutrophils Relative %: 44 %
Platelets: 173 10*3/uL (ref 150–400)
RBC: 3.84 MIL/uL — ABNORMAL LOW (ref 4.22–5.81)
RDW: 15.2 % (ref 11.5–15.5)
WBC: 3.2 10*3/uL — ABNORMAL LOW (ref 4.0–10.5)
nRBC: 0 % (ref 0.0–0.2)

## 2023-12-05 LAB — CMP (CANCER CENTER ONLY)
ALT: 10 U/L (ref 0–44)
AST: 21 U/L (ref 15–41)
Albumin: 4.4 g/dL (ref 3.5–5.0)
Alkaline Phosphatase: 96 U/L (ref 38–126)
Anion gap: 8 (ref 5–15)
BUN: 9 mg/dL (ref 8–23)
CO2: 27 mmol/L (ref 22–32)
Calcium: 10.1 mg/dL (ref 8.9–10.3)
Chloride: 103 mmol/L (ref 98–111)
Creatinine: 0.87 mg/dL (ref 0.61–1.24)
GFR, Estimated: >60 mL/min (ref >60)
Glucose, Bld: 103 mg/dL — ABNORMAL HIGH (ref 70–99)
Potassium: 3.7 mmol/L (ref 3.5–5.1)
Sodium: 138 mmol/L (ref 135–145)
Total Bilirubin: 0.6 mg/dL (ref 0.0–1.2)
Total Protein: 8.1 g/dL (ref 6.5–8.1)

## 2023-12-05 LAB — IRON AND IRON BINDING CAPACITY (CC-WL,HP ONLY)
Iron: 82 ug/dL (ref 45–182)
Saturation Ratios: 19 % (ref 17.9–39.5)
TIBC: 430 ug/dL (ref 250–450)
UIBC: 348 ug/dL (ref 117–376)

## 2023-12-05 LAB — VITAMIN B12: Vitamin B-12: 285 pg/mL (ref 180–914)

## 2023-12-05 NOTE — Progress Notes (Unsigned)
 [facility] CONSULT NOTE  Patient Care Team: Willo Mini, NP as PCP - General (Nurse Practitioner) Willo Mini, NP (Nurse Practitioner)  CHIEF COMPLAINTS/PURPOSE OF CONSULTATION:  Pancytopenia  ASSESSMENT & PLAN:  No problem-specific Assessment & Plan notes found for this encounter.  No orders of the defined types were placed in this encounter.    HISTORY OF PRESENTING ILLNESS:  Richard Andrews 63 y.o. male is here because of pancytopenia.    REVIEW OF SYSTEMS:   Constitutional: Denies fevers, chills or abnormal night sweats Eyes: Denies blurriness of vision, double vision or watery eyes Ears, nose, mouth, throat, and face: Denies mucositis or sore throat Respiratory: Denies cough, dyspnea or wheezes Cardiovascular: Denies palpitation, chest discomfort or lower extremity swelling Gastrointestinal:  Denies nausea, heartburn or change in bowel habits Skin: Denies abnormal skin rashes Lymphatics: Denies new lymphadenopathy or easy bruising Neurological:Denies numbness, tingling or new weaknesses Behavioral/Psych: Mood is stable, no new changes  All other systems were reviewed with the patient and are negative.  MEDICAL HISTORY:  Past Medical History:  Diagnosis Date   Allergy    Arthritis    Distal radius fracture, left    Hepatic steatosis    History of kidney stones    Hypertension    Kidney stone 02/16/2023   Substance abuse (HCC)     SURGICAL HISTORY: Past Surgical History:  Procedure Laterality Date   BIOPSY  01/22/2021   Procedure: BIOPSY;  Surgeon: San Sandor GAILS, DO;  Location: [facility];  Service: Gastroenterology;;   CYSTOSCOPY W/ URETERAL STENT PLACEMENT Bilateral 01/23/2023   Procedure: 1. Cystoscopy 2. Bilateral  retrograde pyelogram with interpretation 3. bilateral ureteral stent placement (6x24 cm on the right, 6x26 cm on the left) 4. Fluoroscopy <1 hour with intraoperative interpretation;  Surgeon: Lovie Arlyss CROME, MD;  Location: [facility]  OR;  Service: Urology;  Laterality: Bilateral;   CYSTOSCOPY/URETEROSCOPY/HOLMIUM LASER/STENT PLACEMENT Bilateral 03/21/2023   Procedure: BILATERAL URETEROSCOPY/HOLMIUM LASER/STENT PLACEMENT;  Surgeon: Lovie Arlyss CROME, MD;  Location: [facility];  Service: Urology;  Laterality: Bilateral;   ESOPHAGOGASTRODUODENOSCOPY (EGD) WITH PROPOFOL  N/A 01/22/2021   Procedure: ESOPHAGOGASTRODUODENOSCOPY (EGD) WITH PROPOFOL ;  Surgeon: San Sandor GAILS, DO;  Location: [facility];  Service: Gastroenterology;  Laterality: N/A;   I & D EXTREMITY Right 01/10/2020   Procedure: ORIF RIGHT WRIST;  Surgeon: Camella Fallow, MD;  Location: [facility] OR;  Service: Orthopedics;  Laterality: Right;   IR ANGIO EXTERNAL CAROTID SEL EXT CAROTID UNI R MOD SED  10/04/2022   IR ANGIO INTRA EXTRACRAN SEL INTERNAL CAROTID UNI R MOD SED  09/30/2022   IR ANGIOGRAM FOLLOW UP STUDY  09/30/2022   IR NEURO EACH ADD'L AFTER BASIC UNI RIGHT (MS)  10/04/2022   IR TRANSCATH/EMBOLIZ  09/30/2022   NO PAST SURGERIES     OPEN REDUCTION INTERNAL FIXATION (ORIF) DISTAL RADIAL FRACTURE Left 01/28/2015   Procedure: OPEN REDUCTION INTERNAL FIXATION (ORIF) LEFT DISTAL RADIAL FRACTURE;  Surgeon: Kay CHRISTELLA Cummins, MD;  Location: [facility];  Service: Orthopedics;  Laterality: Left;   ORIF HUMERUS FRACTURE Right 09/27/2022   Procedure: OPEN REDUCTION INTERNAL FIXATION (ORIF) PROXIMAL HUMERUS FRACTURE;  Surgeon: Josefina Chew, MD;  Location: [facility] OR;  Service: Orthopedics;  Laterality: Right;   RADIOLOGY WITH ANESTHESIA N/A 09/30/2022   Procedure: IR WITH ANESTHESIA MMA EMBOLIZATION;  Surgeon: Radiologist, Medication, MD;  Location: [facility] OR;  Service: Radiology;  Laterality: N/A;   TONSILLECTOMY     TOTAL HIP ARTHROPLASTY Left 10/12/2023   Procedure: ARTHROPLASTY, HIP, TOTAL,  ANTERIOR APPROACH;  Surgeon: Fidel Rogue, MD;  Location: [facility];  Service: Orthopedics;  Laterality: Left;    SOCIAL HISTORY: Social History   Socioeconomic History   Marital status:  Legally Separated    Spouse name: Tawni   Number of children: 5   Years of education: 12   Highest education level: Not on file  Occupational History   Occupation: Emergency planning/management officer  Tobacco Use   Smoking status: Every Day    Current packs/day: 0.50    Average packs/day: 0.5 packs/day for 20.5 years (10.2 ttl pk-yrs)    Types: Cigarettes    Start date: 2005   Smokeless tobacco: Never  Vaping Use   Vaping status: Never Used  Substance and Sexual Activity   Alcohol  use: Yes    Alcohol /week: 16.0 - 20.0 standard drinks of alcohol     Types: 16 - 20 Standard drinks or equivalent per week    Comment: 2/5 a week   Drug use: Never   Sexual activity: Not Currently    Partners: Female  Other Topics Concern   Not on file  Social History Narrative   ** Merged History Encounter **       Social Drivers of Health   Financial Resource Strain: Not on file  Food Insecurity: Food Insecurity Present (10/11/2023)   Hunger Vital Sign    Worried About Running Out of Food in the Last Year: Often true    Ran Out of Food in the Last Year: Often true  Transportation Needs: No Transportation Needs (10/11/2023)   PRAPARE - Administrator, Civil Service (Medical): No    Lack of Transportation (Non-Medical): No  Recent Concern: Transportation Needs - Unmet Transportation Needs (09/26/2023)   PRAPARE - Administrator, Civil Service (Medical): Yes    Lack of Transportation (Non-Medical): No  Physical Activity: Not on file  Stress: Not on file  Social Connections: Unknown (08/01/2023)   Social Connection and Isolation Panel    Frequency of Communication with Friends and Family: Three times a week    Frequency of Social Gatherings with Friends and Family: Patient declined    Attends Religious Services: Patient declined    Active Member of Clubs or Organizations: Patient declined    Attends Banker Meetings: Never    Marital Status: Divorced  Recent Concern: Social  Connections - Socially Isolated (07/06/2023)   Social Connection and Isolation Panel    Frequency of Communication with Friends and Family: Three times a week    Frequency of Social Gatherings with Friends and Family: Not on file    Attends Religious Services: Never    Active Member of Clubs or Organizations: No    Attends Banker Meetings: Never    Marital Status: Divorced  Catering manager Violence: Not At Risk (10/11/2023)   Humiliation, Afraid, Rape, and Kick questionnaire    Fear of Current or Ex-Partner: No    Emotionally Abused: No    Physically Abused: No    Sexually Abused: No    FAMILY HISTORY: Family History  Problem Relation Age of Onset   Heart disease Mother    Hyperlipidemia Mother    Skin cancer Mother    Cancer Father    Multiple myeloma Sister    Throat cancer Sister     ALLERGIES:  has no known allergies.  MEDICATIONS:  Current Outpatient Medications  Medication Sig Dispense Refill   acetaminophen  (TYLENOL ) 500 MG tablet Take 500 mg by mouth every  6 (six) hours as needed for moderate pain (pain score 4-6).     docusate sodium  (COLACE) 100 MG capsule Take 1 capsule (100 mg total) by mouth 2 (two) times daily.     folic acid  (FOLVITE ) 1 MG tablet Take 1 tablet (1 mg total) by mouth daily. (Patient not taking: Reported on 10/11/2023) 30 tablet 0   Multiple Vitamin (MULTIVITAMIN WITH MINERALS) TABS tablet Take 1 tablet by mouth daily. (Patient not taking: Reported on 10/11/2023) 30 tablet 0   polyethylene glycol (MIRALAX  / GLYCOLAX ) 17 g packet Take 17 g by mouth daily as needed for moderate constipation or severe constipation.     thiamine  (VITAMIN B-1) 100 MG tablet Take 1 tablet (100 mg total) by mouth daily. (Patient not taking: Reported on 10/11/2023) 30 tablet 0   No current facility-administered medications for this visit.     PHYSICAL EXAMINATION: ECOG PERFORMANCE STATUS: {CHL ONC ECOG ED:[id number]}  Vitals:   12/05/23 1434  BP: 112/75   Pulse: 63  Resp: 16  Temp: 98.7 F (37.1 C)  SpO2: 100%   Filed Weights   12/05/23 1434  Weight: 124 lb 14.4 oz (56.7 kg)    GENERAL:alert, no distress and comfortable SKIN: skin color, texture, turgor are normal, no rashes or significant lesions EYES: normal, conjunctiva are pink and non-injected, sclera clear OROPHARYNX:no exudate, no erythema and lips, buccal mucosa, and tongue normal  NECK: supple, thyroid normal size, non-tender, without nodularity LYMPH:  no palpable lymphadenopathy in the cervical, axillary or inguinal LUNGS: clear to auscultation and percussion with normal breathing effort HEART: regular rate & rhythm and no murmurs and no lower extremity edema ABDOMEN:abdomen soft, non-tender and normal bowel sounds Musculoskeletal:no cyanosis of digits and no clubbing  PSYCH: alert & oriented x 3 with fluent speech NEURO: no focal motor/sensory deficits  LABORATORY DATA:  I have reviewed the data as listed Lab Results  Component Value Date   WBC 3.3 (L) 10/20/2023   HGB 8.4 (L) 10/20/2023   HCT 26.7 (L) 10/20/2023   MCV 100.4 (H) 10/20/2023   PLT 155 10/20/2023     Chemistry      Component Value Date/Time   NA 136 10/20/2023 0536   NA 136 05/25/2023 1044   K 3.3 (L) 10/20/2023 0536   CL 104 10/20/2023 0536   CO2 22 10/20/2023 0536   BUN 13 10/20/2023 0536   BUN 11 05/25/2023 1044   CREATININE 0.67 10/20/2023 0536   CREATININE 0.78 02/16/2023 0933      Component Value Date/Time   CALCIUM  8.4 (L) 10/20/2023 0536   ALKPHOS 110 10/10/2023 2010   AST 35 10/10/2023 2010   ALT 18 10/10/2023 2010   BILITOT 0.8 10/10/2023 2010   BILITOT 0.9 05/25/2023 1044       RADIOGRAPHIC STUDIES: I have personally reviewed the radiological images as listed and agreed with the findings in the report. No results found.  All questions were answered. The patient knows to call the clinic with any problems, questions or concerns. I spent *** minutes in the care of this  patient including H and P, review of records, counseling and coordination of care.     Amber Stalls, MD 12/05/2023 2:46 PM

## 2023-12-06 LAB — FOLATE RBC
Folate, Hemolysate: 560 ng/mL
Folate, RBC: 1497 ng/mL (ref >498)
Hematocrit: 37.4 % — ABNORMAL LOW (ref 37.5–51.0)

## 2023-12-06 LAB — FERRITIN: Ferritin: 62 ng/mL (ref 24–336)

## 2023-12-06 LAB — TSH: TSH: 1.86 u[IU]/mL (ref 0.350–4.500)

## 2023-12-09 LAB — FANA STAINING PATTERNS: Speckled Pattern: 24529 — ABNORMAL HIGH

## 2023-12-09 LAB — ANTINUCLEAR ANTIBODIES, IFA: ANA Ab, IFA: POSITIVE — AB

## 2023-12-12 ENCOUNTER — Telehealth: Payer: Self-pay | Admitting: Hematology and Oncology

## 2023-12-12 NOTE — Telephone Encounter (Signed)
 Spoke with patient confirming upcoming appointment

## 2023-12-29 ENCOUNTER — Encounter: Payer: Self-pay | Admitting: Advanced Practice Midwife

## 2024-01-16 ENCOUNTER — Telehealth: Payer: Self-pay

## 2024-01-16 NOTE — Telephone Encounter (Signed)
 Left message to confirm appt for 8/6

## 2024-01-17 ENCOUNTER — Other Ambulatory Visit: Payer: Self-pay | Admitting: Hematology and Oncology

## 2024-01-17 ENCOUNTER — Inpatient Hospital Stay: Attending: Hematology and Oncology | Admitting: Hematology and Oncology

## 2024-01-17 DIAGNOSIS — R768 Other specified abnormal immunological findings in serum: Secondary | ICD-10-CM

## 2024-01-17 DIAGNOSIS — D61818 Other pancytopenia: Secondary | ICD-10-CM

## 2024-01-17 NOTE — Progress Notes (Deleted)
 Pt didn't show up to his appointment.  I tried calling the pt, no answer, left him a voicemail to call us  back  Because of abnormal ANA profile, we will also refer him to rheumatology.  Amber Stalls MD

## 2024-02-20 ENCOUNTER — Ambulatory Visit: Payer: Self-pay | Admitting: Hematology and Oncology

## 2024-02-21 NOTE — Telephone Encounter (Signed)
 Spoke with pt per MD. Explained importance of rheum appt and he is agreeable. He was given number to Dr Jeannetta office and states he will call. Spoke with Dr Jeannetta office and they will re-open referral and call pt.

## 2024-02-21 NOTE — Telephone Encounter (Signed)
-----   Message from [place] sent at 02/20/2024  7:55 PM EDT ----- Leita  I have tried reaching the pt several times but he didn't answer and he no showed. The ANA testing came back abnormal, so I made a referral to rheumatology. Do you want to try reaching out to him  again and see if we can pass on this information.  Thanks, ----- Message ----- From: Interface, Lab In [place] Sent: 12/05/2023   3:24 PM EDT To: Amber Stalls, MD

## 2024-07-17 NOTE — Progress Notes (Unsigned)
 "  Office Visit Note  Patient: Richard Andrews             Date of Birth: January 12, 1961           MRN: [id number]             PCP: Willo Mini, NP Referring: Loretha Ash, MD Visit Date: 07/18/2024 Occupation: Data Unavailable  Subjective:  No chief complaint on file.   History of Present Illness: Richard Andrews is a 64 y.o. male ***     Activities of Daily Living:  Patient reports morning stiffness for *** {minute/hour:19697}.   Patient {ACTIONS;DENIES/REPORTS:21021675::Denies} nocturnal pain.  Difficulty dressing/grooming: {ACTIONS;DENIES/REPORTS:21021675::Denies} Difficulty climbing stairs: {ACTIONS;DENIES/REPORTS:21021675::Denies} Difficulty getting out of chair: {ACTIONS;DENIES/REPORTS:21021675::Denies} Difficulty using hands for taps, buttons, cutlery, and/or writing: {ACTIONS;DENIES/REPORTS:21021675::Denies}  No Rheumatology ROS completed.   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   Kidney stone 02/16/2023   Colitis due to Clostridioides difficile 01/25/2023   History of seizure due to alcohol  withdrawal 01/23/2023   History of bleeding peptic ulcer 01/23/2023   Hyponatremia 01/22/2023   Lactic acidosis 01/22/2023   Hypokalemia 01/22/2023   Hypomagnesemia 01/22/2023   Physical deconditioning 01/22/2023   Hyperbilirubinemia 01/22/2023   Hepatic steatosis 01/22/2023   SDH  (subdural hematoma) (HCC) 01/22/2023   Starvation and alcoholic ketosis 91/88/7975   Closed nondisplaced fracture of left tibial plateau 11/21/2022   ICH (intracerebral hemorrhage) (HCC) 11/15/2022   Subdural hematoma (HCC) 10/07/2022   Fall 09/24/2022   Alcohol  withdrawal syndrome without complication (HCC)    Gastritis and gastroduodenitis    Multiple gastric ulcers    Duodenal ulcer    Acute blood loss anemia    Alcohol  withdrawal seizure with complication (HCC)    GI bleed 01/19/2021   QT prolongation 01/19/2021   Electrolyte imbalance 02/06/2020   Macrocytic anemia 02/06/2020   Acute pain of right wrist 02/06/2020   Encounter for orthopedic follow-up care 01/28/2020   Open Colles' fracture of right radius 01/10/2020   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   Increased ammonia level 12/21/2016   Thrombocytopenia 12/21/2016   Macrocytosis 03/21/2016   Elevated lipase 03/21/2016   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   Alcohol  use disorder, moderate, dependence (HCC) 02/12/2016   Essential hypertension 01/22/2015    Past Medical History:  Diagnosis Date   Allergy    Arthritis    Distal radius fracture, left    Hepatic steatosis    History of kidney stones    Hypertension    Kidney stone 02/16/2023   Substance abuse (HCC)     Family History  Problem Relation Age of Onset   Heart disease Mother    Hyperlipidemia Mother    Skin cancer Mother    Cancer Father    Multiple myeloma Sister    Throat cancer Sister    Past Surgical History:  Procedure Laterality Date   BIOPSY  01/22/2021   Procedure: BIOPSY;  Surgeon: San Sandor GAILS, DO;  Location: [facility]  END[facility]COPY;  Service: Gastroenterology;;   CYSTOSCOPY W/ URETERAL STENT PLACEMENT Bilateral 01/23/2023   Procedure: 1. Cystoscopy 2. Bilateral  retrograde pyelogram with interpretation 3. bilateral ureteral stent placement (6x24 cm on the right, 6x26 cm on the left) 4. Fluoroscopy <1 hour with intraoperative interpretation;  Surgeon:  Lovie Arlyss CROME, MD;  Location: [facility] OR;  Service: Urology;  Laterality: Bilateral;   CYSTOSCOPY/URETEROSCOPY/HOLMIUM LASER/STENT PLACEMENT Bilateral 03/21/2023   Procedure: BILATERAL URETEROSCOPY/HOLMIUM LASER/STENT PLACEMENT;  Surgeon: Lovie Arlyss CROME, MD;  Location: [facility] ORS;  Service: Urology;  Laterality: Bilateral;   ESOPHAGOGASTRODUODENOSCOPY (EGD) WITH PROPOFOL  N/A 01/22/2021   Procedure: ESOPHAGOGASTRODUODENOSCOPY (EGD) WITH PROPOFOL ;  Surgeon: San Sandor GAILS, DO;  Location: [facility];  Service: Gastroenterology;  Laterality: N/A;   I & D EXTREMITY Right 01/10/2020   Procedure: ORIF RIGHT WRIST;  Surgeon: Camella Fallow, MD;  Location: [facility] OR;  Service: Orthopedics;  Laterality: Right;   IR ANGIO EXTERNAL CAROTID SEL EXT CAROTID UNI R MOD SED  10/04/2022   IR ANGIO INTRA EXTRACRAN SEL INTERNAL CAROTID UNI R MOD SED  09/30/2022   IR ANGIOGRAM FOLLOW UP STUDY  09/30/2022   IR NEURO EACH ADD'L AFTER BASIC UNI RIGHT (MS)  10/04/2022   IR TRANSCATH/EMBOLIZ  09/30/2022   NO PAST SURGERIES     OPEN REDUCTION INTERNAL FIXATION (ORIF) DISTAL RADIAL FRACTURE Left 01/28/2015   Procedure: OPEN REDUCTION INTERNAL FIXATION (ORIF) LEFT DISTAL RADIAL FRACTURE;  Surgeon: Kay CHRISTELLA Cummins, MD;  Location: [facility];  Service: Orthopedics;  Laterality: Left;   ORIF HUMERUS FRACTURE Right 09/27/2022   Procedure: OPEN REDUCTION INTERNAL FIXATION (ORIF) PROXIMAL HUMERUS FRACTURE;  Surgeon: Josefina Chew, MD;  Location: [facility] OR;  Service: Orthopedics;  Laterality: Right;   RADIOLOGY WITH ANESTHESIA N/A 09/30/2022   Procedure: IR WITH ANESTHESIA MMA EMBOLIZATION;  Surgeon: Radiologist, Medication, MD;  Location: [facility] OR;  Service: Radiology;  Laterality: N/A;   TONSILLECTOMY     TOTAL HIP ARTHROPLASTY Left 10/12/2023   Procedure: ARTHROPLASTY, HIP, TOTAL, ANTERIOR APPROACH;  Surgeon: Fidel Rogue, MD;  Location: [facility] OR[facility];  Service: Orthopedics;  Laterality: Left;   Social History[1] Social History    Social History Narrative   ** Merged History Encounter **         Immunization History  Administered Date(s) Administered   Influenza-Unspecified 01/12/2023   PFIZER(Purple Top)SARS-COV-2 Vaccination 02/21/2020, 03/13/2020   Pneumococcal Polysaccharide-23 02/12/2016   Tdap 05/04/2011, 01/10/2020     Objective: Vital Signs: There were no vitals taken for this visit.   Physical Exam   Musculoskeletal Exam: ***   Investigation: No additional findings.  Imaging: No results found.  Recent Labs: Lab Results  Component Value Date   WBC 3.2 (L) 12/05/2023   HGB 11.9 (L) 12/05/2023   PLT 173 12/05/2023   NA 138 12/05/2023   K 3.7 12/05/2023   CL 103 12/05/2023   CO2 27 12/05/2023   GLUCOSE 103 (H) 12/05/2023   BUN 9 12/05/2023   CREATININE 0.87 12/05/2023   BILITOT 0.6 12/05/2023   ALKPHOS 96 12/05/2023   AST 21 12/05/2023   ALT 10 12/05/2023   PROT 8.1 12/05/2023   ALBUMIN  4.4 12/05/2023   CALCIUM  10.1 12/05/2023   GFRAA 104 02/06/2020    Speciality Comments: No specialty comments available.  Procedures:  No procedures performed Allergies: Patient has no known allergies.   Assessment / Plan:     Visit Diagnoses:  Assessment & Plan  ***  Follow-Up Instructions: No follow-ups on file.   Lonni ORN Jany Buckwalter,  MD  Note - This record has been created using Animal nutritionist.  Chart creation errors have been sought, but may not always  have been located. Such creation errors do not reflect on  the standard of medical care.    [1]  Social History Tobacco Use   Smoking status: Every Day    Current packs/day: 0.50    Average packs/day: 0.5 packs/day for 21.1 years (10.5 ttl pk-yrs)    Types: Cigarettes    Start date: 2005   Smokeless tobacco: Never  Vaping Use   Vaping status: Never Used  Substance Use Topics   Alcohol  use: Yes    Alcohol /week: 16.0 - 20.0 standard drinks of alcohol     Types: 16 - 20 Standard drinks or equivalent per week     Comment: 2/5 a week   Drug use: Never   "

## 2024-07-18 ENCOUNTER — Encounter: Admitting: Internal Medicine
